# Patient Record
Sex: Male | Born: 1955 | Race: Black or African American | Hispanic: No | Marital: Single | Smoking: Current every day smoker
Health system: Southern US, Community
[De-identification: ages and names within clinical notes are randomized; demographics above are authoritative.]

## PROBLEM LIST (undated history)

## (undated) ENCOUNTER — Emergency Department (HOSPITAL_COMMUNITY): Payer: Medicare Other | Source: Home / Self Care

## (undated) DIAGNOSIS — R768 Other specified abnormal immunological findings in serum: Secondary | ICD-10-CM

## (undated) DIAGNOSIS — J45909 Unspecified asthma, uncomplicated: Secondary | ICD-10-CM

## (undated) DIAGNOSIS — Z9981 Dependence on supplemental oxygen: Secondary | ICD-10-CM

## (undated) DIAGNOSIS — I10 Essential (primary) hypertension: Secondary | ICD-10-CM

## (undated) DIAGNOSIS — J449 Chronic obstructive pulmonary disease, unspecified: Secondary | ICD-10-CM

## (undated) DIAGNOSIS — E785 Hyperlipidemia, unspecified: Secondary | ICD-10-CM

## (undated) DIAGNOSIS — R7689 Other specified abnormal immunological findings in serum: Secondary | ICD-10-CM

## (undated) HISTORY — DX: Essential (primary) hypertension: I10

## (undated) HISTORY — DX: Other specified abnormal immunological findings in serum: R76.89

## (undated) HISTORY — DX: Dependence on supplemental oxygen: Z99.81

## (undated) HISTORY — PX: INCISE AND DRAIN ABCESS: PRO64

## (undated) HISTORY — DX: Other specified abnormal immunological findings in serum: R76.8

---

## 1998-11-17 ENCOUNTER — Encounter: Payer: Self-pay | Admitting: *Deleted

## 1998-11-17 ENCOUNTER — Emergency Department (HOSPITAL_COMMUNITY): Admission: EM | Admit: 1998-11-17 | Discharge: 1998-11-17 | Payer: Self-pay | Admitting: Emergency Medicine

## 2001-07-10 ENCOUNTER — Emergency Department (HOSPITAL_COMMUNITY): Admission: EM | Admit: 2001-07-10 | Discharge: 2001-07-10 | Payer: Self-pay | Admitting: Podiatry

## 2001-07-10 ENCOUNTER — Encounter: Payer: Self-pay | Admitting: *Deleted

## 2002-06-17 ENCOUNTER — Emergency Department (HOSPITAL_COMMUNITY): Admission: EM | Admit: 2002-06-17 | Discharge: 2002-06-17 | Payer: Self-pay

## 2002-06-17 ENCOUNTER — Encounter: Payer: Self-pay | Admitting: Emergency Medicine

## 2015-07-17 ENCOUNTER — Emergency Department (HOSPITAL_COMMUNITY)
Admission: EM | Admit: 2015-07-17 | Discharge: 2015-07-17 | Disposition: A | Discharge status: Home / Self Care | Payer: Commercial Managed Care - HMO | Attending: Emergency Medicine | Admitting: Emergency Medicine

## 2015-07-17 ENCOUNTER — Emergency Department (HOSPITAL_COMMUNITY): Payer: Commercial Managed Care - HMO

## 2015-07-17 ENCOUNTER — Encounter (HOSPITAL_COMMUNITY): Payer: Self-pay | Admitting: Emergency Medicine

## 2015-07-17 DIAGNOSIS — J45909 Unspecified asthma, uncomplicated: Secondary | ICD-10-CM | POA: Diagnosis not present

## 2015-07-17 DIAGNOSIS — F1010 Alcohol abuse, uncomplicated: Secondary | ICD-10-CM | POA: Insufficient documentation

## 2015-07-17 DIAGNOSIS — S299XXA Unspecified injury of thorax, initial encounter: Secondary | ICD-10-CM | POA: Diagnosis not present

## 2015-07-17 DIAGNOSIS — S20311A Abrasion of right front wall of thorax, initial encounter: Secondary | ICD-10-CM | POA: Diagnosis not present

## 2015-07-17 DIAGNOSIS — Y9389 Activity, other specified: Secondary | ICD-10-CM | POA: Diagnosis not present

## 2015-07-17 DIAGNOSIS — Z23 Encounter for immunization: Secondary | ICD-10-CM | POA: Insufficient documentation

## 2015-07-17 DIAGNOSIS — F17210 Nicotine dependence, cigarettes, uncomplicated: Secondary | ICD-10-CM | POA: Diagnosis not present

## 2015-07-17 DIAGNOSIS — W1839XA Other fall on same level, initial encounter: Secondary | ICD-10-CM | POA: Insufficient documentation

## 2015-07-17 DIAGNOSIS — Y998 Other external cause status: Secondary | ICD-10-CM | POA: Diagnosis not present

## 2015-07-17 DIAGNOSIS — S80212A Abrasion, left knee, initial encounter: Secondary | ICD-10-CM | POA: Insufficient documentation

## 2015-07-17 DIAGNOSIS — Y9289 Other specified places as the place of occurrence of the external cause: Secondary | ICD-10-CM | POA: Diagnosis not present

## 2015-07-17 DIAGNOSIS — W19XXXA Unspecified fall, initial encounter: Secondary | ICD-10-CM

## 2015-07-17 DIAGNOSIS — S20211A Contusion of right front wall of thorax, initial encounter: Secondary | ICD-10-CM | POA: Diagnosis not present

## 2015-07-17 DIAGNOSIS — R0781 Pleurodynia: Secondary | ICD-10-CM | POA: Diagnosis not present

## 2015-07-17 DIAGNOSIS — S0081XA Abrasion of other part of head, initial encounter: Secondary | ICD-10-CM | POA: Diagnosis not present

## 2015-07-17 HISTORY — DX: Unspecified asthma, uncomplicated: J45.909

## 2015-07-17 MED ORDER — HYDROCODONE-ACETAMINOPHEN 5-325 MG PO TABS
1.0000 | ORAL_TABLET | Freq: Once | ORAL | Status: AC
Start: 2015-07-17 — End: 2015-07-17
  Administered 2015-07-17: 1 via ORAL
  Filled 2015-07-17: qty 1

## 2015-07-17 MED ORDER — NAPROXEN 375 MG PO TABS: 375.0000 mg | ORAL_TABLET | Freq: Two times a day (BID) | ORAL | Status: DC

## 2015-07-17 MED ORDER — HYDROCODONE-ACETAMINOPHEN 5-325 MG PO TABS: 1.0000 | ORAL_TABLET | ORAL | Status: DC | PRN

## 2015-07-17 MED ORDER — TETANUS-DIPHTH-ACELL PERTUSSIS 5-2.5-18.5 LF-MCG/0.5 IM SUSP
0.5000 mL | Freq: Once | INTRAMUSCULAR | Status: AC
  Administered 2015-07-17: 0.5 mL via INTRAMUSCULAR
  Filled 2015-07-17: qty 0.5

## 2015-07-17 NOTE — ED Notes (Addendum)
Fell last night about 9 pm after drinking a lot hurt rt chest, hurts to take deep breath and move,  And bumped his head,  No loc he states, cannot remember  If he fell down steps or not ,  , hurt his left knee also

## 2015-07-17 NOTE — Discharge Instructions (Signed)
Be sure to use the incentive spirometer every hour as a breathing exercise to help prevent pneumonia. Do not drink alcohol while taking the hydrocodone as this can cause excessive sleepiness, dizziness or other bad reactions. Do not drive or operate heavy machinery with this medicine. If your pain worsens or is not controlled or you develop cough or shortness of breath then return to the ER.

## 2015-07-17 NOTE — ED Provider Notes (Signed)
CSN: [id number]     Arrival date & time 07/17/15  0919 History   First MD Initiated Contact with Patient 07/17/15 1002     Chief Complaint  Patient presents with  . Fall     (Consider location/radiation/quality/duration/timing/severity/associated sxs/prior Treatment) HPI  60 year old male presents after a fall last night causing right-sided chest pain. Patient doesn't remember exactly what happened but knows that he was drinking "a lot" and that he fell last night. Does not specifically remember exactly what happened but thinks he may have hit a curb on his chest. He noticed a small abrasion to his right for head and has a small abrasion to his left knee. He denies any knee pain. No headache, nausea, vomiting, or blurry vision. No dizziness. The only pain he has at this time as his right chest, which hurts with inspiration and cough. Has had a cough for several weeks after having the "flu". Denies shortness of breath.  Past Medical History  Diagnosis Date  . Asthma    History reviewed. No pertinent past surgical history. No family history on file. Social History  Substance Use Topics  . Smoking status: Current Every Day Smoker    Types: Cigarettes  . Smokeless tobacco: None  . Alcohol Use: Yes    Review of Systems  Respiratory: Positive for cough. Negative for shortness of breath.   Cardiovascular: Positive for chest pain.  Gastrointestinal: Negative for nausea, vomiting and abdominal pain.  Musculoskeletal: Negative for arthralgias.  Neurological: Negative for dizziness, weakness, numbness and headaches.      Allergies  Review of patient's allergies indicates no known allergies.  Home Medications   Prior to Admission medications   Not on File   BP 154/98 mmHg  Pulse 77  Temp(Src) 98.9 F (37.2 C) (Oral)  Resp 18  SpO2 100% Physical Exam  Constitutional: He is oriented to person, place, and time. He appears well-developed and well-nourished.  HENT:  Head:  Normocephalic. Head is with abrasion.    Right Ear: External ear normal.  Left Ear: External ear normal.  Nose: Nose normal.  Eyes: EOM are normal. Pupils are equal, round, and reactive to light. Right eye exhibits no discharge. Left eye exhibits no discharge.  Neck: Neck supple.  Cardiovascular: Normal rate, regular rhythm, normal heart sounds and intact distal pulses.   Pulmonary/Chest: Effort normal and breath sounds normal. He exhibits tenderness. He exhibits no deformity.    Abdominal: Soft. There is no tenderness.  Musculoskeletal: He exhibits no edema.       Left knee: He exhibits normal range of motion, no swelling and no deformity. No tenderness found.       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  Alert and oriented x 3. CN 2-12 grossly intact. 5/5 strength in all 4 extremities. Grossly normal sensation  Skin: Skin is warm and dry.  Nursing note and vitals reviewed.   ED Course  Procedures (including critical care time) Labs Review Labs Reviewed - No data to display  Imaging Review Dg Ribs Unilateral W/chest Right  07/17/2015  CLINICAL DATA:  Anterior lower right rib pain after the patient fell this morning. EXAM: RIGHT RIBS AND CHEST - 3+ VIEW COMPARISON:  None. FINDINGS: No visible rib fracture. Benign bone island in the anterior aspect of the right eighth rib. No pneumothorax. Heart size and pulmonary vascularity are normal. Minimal right pleural effusion. Bold left posterior lateral fifth rib fracture.  IMPRESSION: No visible acute right rib fracture. Probable tiny small right pleural effusion. Electronically Signed   By: Lorriane Shire M.D.   On: 07/17/2015 10:50   I have personally reviewed and evaluated these images and lab results as part of my medical decision-making.   EKG Interpretation None      MDM   Final diagnoses:  Fall, initial encounter  Rib contusion, right, initial encounter      No obvious rib fracture on x-ray imaging. Given he is point tender I have low suspicion for other chest etiology for his pain. Pain is improved with a hydrocodone. At this point I think he is having a chest contusion. He does have a very small abrasion to his right scalp but no headache, trouble walking, or neuro findings. No risk factors based on [place] Head CT rule. Do not think head CT is indicated. Will given incentive spirometer, NSAIDs, and hydrocodone for severe pain. Counseled on importance of NSAIDs from her. Counseled not to drink alcohol and take hydrocodone at same time. Discussed return precautions.    Sherwood Gambler, MD 07/17/15 1139

## 2016-06-02 ENCOUNTER — Ambulatory Visit: Payer: Commercial Managed Care - HMO | Admitting: Family Medicine

## 2016-06-10 ENCOUNTER — Ambulatory Visit: Payer: Medicare HMO | Attending: Family Medicine | Admitting: Family Medicine

## 2016-06-10 VITALS — BP 157/91 | HR 67 | Temp 97.7°F | Resp 18 | Ht 69.0 in | Wt 130.2 lb

## 2016-06-10 DIAGNOSIS — Z0000 Encounter for general adult medical examination without abnormal findings: Secondary | ICD-10-CM | POA: Insufficient documentation

## 2016-06-10 DIAGNOSIS — R05 Cough: Secondary | ICD-10-CM | POA: Diagnosis not present

## 2016-06-10 DIAGNOSIS — F17200 Nicotine dependence, unspecified, uncomplicated: Secondary | ICD-10-CM | POA: Diagnosis not present

## 2016-06-10 DIAGNOSIS — F17210 Nicotine dependence, cigarettes, uncomplicated: Secondary | ICD-10-CM | POA: Insufficient documentation

## 2016-06-10 DIAGNOSIS — I10 Essential (primary) hypertension: Secondary | ICD-10-CM | POA: Diagnosis not present

## 2016-06-10 DIAGNOSIS — J45909 Unspecified asthma, uncomplicated: Secondary | ICD-10-CM | POA: Insufficient documentation

## 2016-06-10 DIAGNOSIS — R053 Chronic cough: Secondary | ICD-10-CM

## 2016-06-10 DIAGNOSIS — Z79899 Other long term (current) drug therapy: Secondary | ICD-10-CM | POA: Insufficient documentation

## 2016-06-10 DIAGNOSIS — Z7689 Persons encountering health services in other specified circumstances: Secondary | ICD-10-CM | POA: Insufficient documentation

## 2016-06-10 MED ORDER — AMLODIPINE BESYLATE 5 MG PO TABS: 5.0000 mg | ORAL_TABLET | Freq: Every day | ORAL | 3 refills | Status: DC

## 2016-06-10 MED ORDER — GUAIFENESIN ER 600 MG PO TB12: 600.0000 mg | ORAL_TABLET | Freq: Two times a day (BID) | ORAL | 0 refills | Status: DC

## 2016-06-10 MED ORDER — MONTELUKAST SODIUM 10 MG PO TABS: 10.0000 mg | ORAL_TABLET | Freq: Every day | ORAL | 6 refills | Status: DC

## 2016-06-10 MED ORDER — ALBUTEROL SULFATE HFA 108 (90 BASE) MCG/ACT IN AERS: 2.0000 | INHALATION_SPRAY | Freq: Four times a day (QID) | RESPIRATORY_TRACT | 2 refills | Status: DC | PRN

## 2016-06-10 MED ORDER — NICOTINE 21 MG/24HR TD PT24: 21.0000 mg | MEDICATED_PATCH | Freq: Every day | TRANSDERMAL | 1 refills | Status: DC

## 2016-06-10 MED ORDER — LOSARTAN POTASSIUM 25 MG PO TABS: 25.0000 mg | ORAL_TABLET | Freq: Every day | ORAL | 2 refills | Status: DC

## 2016-06-10 MED FILL — LOSARTAN POTASSIUM 25 MG TA: 25 | 30 days supply | Qty: 30 | Fill #0

## 2016-06-10 MED FILL — MONTELUKAST SOD 10 MG TAB: 10 | 30 days supply | Qty: 30 | Fill #0

## 2016-06-10 MED FILL — ?AMLODIPINE BESYLATE 5 MG T: 5 | 30 days supply | Qty: 30 | Fill #0

## 2016-06-10 MED FILL — !VENTOLIN HFA INHALER: 108 (90 BAS | 25 days supply | Qty: 18 | Fill #0

## 2016-06-10 NOTE — Progress Notes (Signed)
Patient is here for physical  Patient is not on any current medication  Patient stated that he fell couple months ago and now he has pain on his right and left side of back   Patient declined the flu shot today  Patient has eaten today

## 2016-06-10 NOTE — Patient Instructions (Addendum)
Schedule appointment for lab work. Apply for orange card to complete referral process.    Hypertension Hypertension, commonly called high blood pressure, is when the force of blood pumping through the arteries is too strong. The arteries are the blood vessels that carry blood from the heart throughout the body. Hypertension forces the heart to work harder to pump blood and may cause arteries to become narrow or stiff. Having untreated or uncontrolled hypertension can cause heart attacks, strokes, kidney disease, and other problems. A blood pressure reading consists of a higher number over a lower number. Ideally, your blood pressure should be below 120/80. The first ("top") number is called the systolic pressure. It is a measure of the pressure in your arteries as your heart beats. The second ("bottom") number is called the diastolic pressure. It is a measure of the pressure in your arteries as the heart relaxes. What are the causes? The cause of this condition is not known. What increases the risk? Some risk factors for high blood pressure are under your control. Others are not. Factors you can change   Smoking.  Having type 2 diabetes mellitus, high cholesterol, or both.  Not getting enough exercise or physical activity.  Being overweight.  Having too much fat, sugar, calories, or salt (sodium) in your diet.  Drinking too much alcohol. Factors that are difficult or impossible to change   Having chronic kidney disease.  Having a family history of high blood pressure.  Age. Risk increases with age.  Race. You may be at higher risk if you are African-American.  Gender. Men are at higher risk than women before age 30. After age 76, women are at higher risk than men.  Having obstructive sleep apnea.  Stress. What are the signs or symptoms? Extremely high blood pressure (hypertensive crisis) may cause:  Headache.  Anxiety.  Shortness of breath.  Nosebleed.  Nausea and  vomiting.  Severe chest pain.  Jerky movements you cannot control (seizures). How is this diagnosed? This condition is diagnosed by measuring your blood pressure while you are seated, with your arm resting on a surface. The cuff of the blood pressure monitor will be placed directly against the skin of your upper arm at the level of your heart. It should be measured at least twice using the same arm. Certain conditions can cause a difference in blood pressure between your right and left arms. Certain factors can cause blood pressure readings to be lower or higher than normal (elevated) for a short period of time:  When your blood pressure is higher when you are in a health care provider's office than when you are at home, this is called white coat hypertension. Most people with this condition do not need medicines.  When your blood pressure is higher at home than when you are in a health care provider's office, this is called masked hypertension. Most people with this condition may need medicines to control blood pressure. If you have a high blood pressure reading during one visit or you have normal blood pressure with other risk factors:  You may be asked to return on a different day to have your blood pressure checked again.  You may be asked to monitor your blood pressure at home for 1 week or longer. If you are diagnosed with hypertension, you may have other blood or imaging tests to help your health care provider understand your overall risk for other conditions. How is this treated? This condition is treated by making healthy  lifestyle changes, such as eating healthy foods, exercising more, and reducing your alcohol intake. Your health care provider may prescribe medicine if lifestyle changes are not enough to get your blood pressure under control, and if:  Your systolic blood pressure is above 130.  Your diastolic blood pressure is above 80. Your personal target blood pressure may vary  depending on your medical conditions, your age, and other factors. Follow these instructions at home: Eating and drinking   Eat a diet that is high in fiber and potassium, and low in sodium, added sugar, and fat. An example eating plan is called the DASH (Dietary Approaches to Stop Hypertension) diet. To eat this way:  Eat plenty of fresh fruits and vegetables. Try to fill half of your plate at each meal with fruits and vegetables.  Eat whole grains, such as whole wheat pasta, brown rice, or whole grain bread. Fill about one quarter of your plate with whole grains.  Eat or drink low-fat dairy products, such as skim milk or low-fat yogurt.  Avoid fatty cuts of meat, processed or cured meats, and poultry with skin. Fill about one quarter of your plate with lean proteins, such as fish, chicken without skin, beans, eggs, and tofu.  Avoid premade and processed foods. These tend to be higher in sodium, added sugar, and fat.  Reduce your daily sodium intake. Most people with hypertension should eat less than 1,500 mg of sodium a day.  Limit alcohol intake to no more than 1 drink a day for nonpregnant women and 2 drinks a day for men. One drink equals 12 oz of beer, 5 oz of wine, or 1 oz of hard liquor. Lifestyle   Work with your health care provider to maintain a healthy body weight or to lose weight. Ask what an ideal weight is for you.  Get at least 30 minutes of exercise that causes your heart to beat faster (aerobic exercise) most days of the week. Activities may include walking, swimming, or biking.  Include exercise to strengthen your muscles (resistance exercise), such as pilates or lifting weights, as part of your weekly exercise routine. Try to do these types of exercises for 30 minutes at least 3 days a week.  Do not use any products that contain nicotine or tobacco, such as cigarettes and e-cigarettes. If you need help quitting, ask your health care provider.  Monitor your blood  pressure at home as told by your health care provider.  Keep all follow-up visits as told by your health care provider. This is important. Medicines   Take over-the-counter and prescription medicines only as told by your health care provider. Follow directions carefully. Blood pressure medicines must be taken as prescribed.  Do not skip doses of blood pressure medicine. Doing this puts you at risk for problems and can make the medicine less effective.  Ask your health care provider about side effects or reactions to medicines that you should watch for. Contact a health care provider if:  You think you are having a reaction to a medicine you are taking.  You have headaches that keep coming back (recurring).  You feel dizzy.  You have swelling in your ankles.  You have trouble with your vision. Get help right away if:  You develop a severe headache or confusion.  You have unusual weakness or numbness.  You feel faint.  You have severe pain in your chest or abdomen.  You vomit repeatedly.  You have trouble breathing. Summary  Hypertension is  when the force of blood pumping through your arteries is too strong. If this condition is not controlled, it may put you at risk for serious complications.  Your personal target blood pressure may vary depending on your medical conditions, your age, and other factors. For most people, a normal blood pressure is less than 120/80.  Hypertension is treated with lifestyle changes, medicines, or a combination of both. Lifestyle changes include weight loss, eating a healthy, low-sodium diet, exercising more, and limiting alcohol. This information is not intended to replace advice given to you by your health care provider. Make sure you discuss any questions you have with your health care provider. Document Released: 03/07/2005 Document Revised: 02/03/2016 Document Reviewed: 02/03/2016 Elsevier Interactive Patient Education  2017 [employer].  Asthma, Adult Asthma is a condition of the lungs in which the airways tighten and narrow. Asthma can make it hard to breathe. Asthma cannot be cured, but medicine and lifestyle changes can help control it. Asthma may be started (triggered) by:  Animal skin flakes (dander).  Dust.  Cockroaches.  Pollen.  Mold.  Smoke.  Cleaning products.  Hair sprays or aerosol sprays.  Paint fumes or strong smells.  Cold air, weather changes, and winds.  Crying or laughing hard.  Stress.  Certain medicines or drugs.  Foods, such as dried fruit, potato chips, and sparkling grape juice.  Infections or conditions (colds, flu).  Exercise.  Certain medical conditions or diseases.  Exercise or tiring activities. Follow these instructions at home:  Take medicine as told by your doctor.  Use a peak flow meter as told by your doctor. A peak flow meter is a tool that measures how well the lungs are working.  Record and keep track of the peak flow meter's readings.  Understand and use the asthma action plan. An asthma action plan is a written plan for taking care of your asthma and treating your attacks.  To help prevent asthma attacks:  Do not smoke. Stay away from secondhand smoke.  Change your heating and air conditioning filter often.  Limit your use of fireplaces and wood stoves.  Get rid of pests (such as roaches and mice) and their droppings.  Throw away plants if you see mold on them.  Clean your floors. Dust regularly. Use cleaning products that do not smell.  Have someone vacuum when you are not home. Use a vacuum cleaner with a HEPA filter if possible.  Replace carpet with wood, tile, or vinyl flooring. Carpet can trap animal skin flakes and dust.  Use allergy-proof pillows, mattress covers, and box spring covers.  Wash bed sheets and blankets every week in hot water and dry them in a dryer.  Use blankets that are made of polyester or cotton.  Clean  bathrooms and kitchens with bleach. If possible, have someone repaint the walls in these rooms with mold-resistant paint. Keep out of the rooms that are being cleaned and painted.  Wash hands often. Contact a doctor if:  You have make a whistling sound when breaking (wheeze), have shortness of breath, or have a cough even if taking medicine to prevent attacks.  The colored mucus you cough up (sputum) is thicker than usual.  The colored mucus you cough up changes from clear or white to yellow, green, gray, or bloody.  You have problems from the medicine you are taking such as:  A rash.  Itching.  Swelling.  Trouble breathing.  You need reliever medicines more than 2-3 times a week.  Your peak flow measurement is still at 50-79% of your personal best after following the action plan for 1 hour.  You have a fever. Get help right away if:  You seem to be worse and are not responding to medicine during an asthma attack.  You are short of breath even at rest.  You get short of breath when doing very little activity.  You have trouble eating, drinking, or talking.  You have chest pain.  You have a fast heartbeat.  Your lips or fingernails start to turn blue.  You are light-headed, dizzy, or faint.  Your peak flow is less than 50% of your personal best. This information is not intended to replace advice given to you by your health care provider. Make sure you discuss any questions you have with your health care provider. Document Released: 08/24/2007 Document Revised: 08/13/2015 Document Reviewed: 10/04/2012 Elsevier Interactive Patient Education  2017 [employer].

## 2016-06-10 NOTE — Progress Notes (Signed)
Subjective:  Patient ID: Jason Wong, male    DOB: 1956-01-18  Age: 61 y.o. MRN: [id number]  CC: Establish Care   HPI Jason Wong presents for   Annual physical exam: Difficulty sleeping for 1 year. 3 hours of sleep per night on average. Taking nothing for symptoms. Denies any SI/HI.  History of asthma since childhood. Reports not having a inhaler. Current everyday cigarette smoker since age 8. He currently smokes 3 packs per day. He reports congested cough for 2 months. Drinks 3 beers daily. Declines resources for substance dependence at this time. He reports FH of HTN and cancer. HTN : Mother and Sister. Cancer: Sister-Breast and Brother colon.  Outpatient Medications Prior to Visit  Medication Sig Dispense Refill  . HYDROcodone-acetaminophen (NORCO) 5-325 MG tablet Take 1 tablet by mouth every 4 (four) hours as needed for severe pain. (Patient not taking: Reported on 06/10/2016) 15 tablet 0  . naproxen (NAPROSYN) 375 MG tablet Take 1 tablet (375 mg total) by mouth 2 (two) times daily with a meal. (Patient not taking: Reported on 06/10/2016) 14 tablet 0  . vitamin E 1000 UNIT capsule Take 1,000 Units by mouth daily.     No facility-administered medications prior to visit.     ROS Review of Systems  Constitutional: Negative.   HENT: Positive for rhinorrhea.   Respiratory: Positive for cough.   Cardiovascular: Negative.   Gastrointestinal: Negative.   Endocrine: Negative.   Genitourinary: Negative.   Neurological: Negative.   Psychiatric/Behavioral: Positive for sleep disturbance. Negative for suicidal ideas.    Objective:  BP (!) 157/91 (BP Location: Left Arm, Patient Position: Sitting, Cuff Size: Normal)   Pulse 67   Temp 97.7 F (36.5 C) (Oral)   Resp 18   Ht 5\' 9"  (1.753 m)   Wt 130 lb 3.2 oz (59.1 kg)   SpO2 98%   BMI 19.23 kg/m   BP/Weight 06/10/2016 9/76/7341  Systolic BP 937 902  Diastolic BP 91 98  Wt. (Lbs) 130.2 -  BMI 19.23 -    Physical Exam    Constitutional: He is oriented to person, place, and time. He appears well-developed and well-nourished.  HENT:  Head: Normocephalic and atraumatic.  Right Ear: External ear normal.  Left Ear: External ear normal.  Nose: Nose normal.  Mouth/Throat: Oropharynx is clear and moist.  Eyes: Conjunctivae and EOM are normal. Pupils are equal, round, and reactive to light.  Neck: Normal range of motion. Neck supple. No JVD present.  Cardiovascular: Normal rate, regular rhythm, normal heart sounds and intact distal pulses.   Pulmonary/Chest: Effort normal and breath sounds normal.  Abdominal: Soft. Bowel sounds are normal.  Musculoskeletal: Normal range of motion.  Missing digits of left hand. History of lawnmower accident at 61 y.o.  Neurological: He is alert and oriented to person, place, and time. He has normal reflexes.  Skin: Skin is warm and dry.  Psychiatric: He has a normal mood and affect. His behavior is normal. Judgment and thought content normal.  Nursing note and vitals reviewed.  Assessment & Plan:   Problem List Items Addressed This Visit      Cardiovascular and Mediastinum   Essential hypertension   -Schedule BP recheck in 2 weeks with nurse.   -If BP is greater than 90/60 (MAP 65 or greater) but not less than 130/80 may increase dose of    amlodipine to 10 mg QD and recheck in another 2 weeks.    Relevant Medications   losartan (COZAAR) 25  MG tablet   amLODipine (NORVASC) 5 MG tablet     Respiratory   Asthma   Relevant Medications   montelukast (SINGULAIR) 10 MG tablet   albuterol (PROVENTIL HFA;VENTOLIN HFA) 108 (90 Base) MCG/ACT inhaler     Other   Current every day smoker   Relevant Medications   nicotine (NICODERM CQ - DOSED IN MG/24 HOURS) 21 mg/24hr patch    Other Visit Diagnoses    Annual physical exam    -  Primary   -Labs placed for future order due to lab being closed early today.    Relevant Orders   Ambulatory referral to Ophthalmology   Ambulatory  referral to Dentistry   Comprehensive metabolic panel   Ambulatory referral to Gastroenterology   Chronic cough       Relevant Medications   guaiFENesin (MUCINEX) 600 MG 12 hr tablet   Other Relevant Orders   DG Chest 2 View      Meds ordered this encounter  Medications  . losartan (COZAAR) 25 MG tablet    Sig: Take 1 tablet (25 mg total) by mouth daily.    Dispense:  30 tablet    Refill:  2    Order Specific Question:   Supervising Provider    Answer:   Tresa Garter [id number]  . amLODipine (NORVASC) 5 MG tablet    Sig: Take 1 tablet (5 mg total) by mouth daily.    Dispense:  90 tablet    Refill:  3    Order Specific Question:   Supervising Provider    Answer:   Tresa Garter [id number]  . montelukast (SINGULAIR) 10 MG tablet    Sig: Take 1 tablet (10 mg total) by mouth at bedtime.    Dispense:  30 tablet    Refill:  6    Order Specific Question:   Supervising Provider    Answer:   Tresa Garter [id number]  . albuterol (PROVENTIL HFA;VENTOLIN HFA) 108 (90 Base) MCG/ACT inhaler    Sig: Inhale 2 puffs into the lungs every 6 (six) hours as needed for wheezing or shortness of breath.    Dispense:  1 Inhaler    Refill:  2    Order Specific Question:   Supervising Provider    Answer:   Tresa Garter [id number]  . nicotine (NICODERM CQ - DOSED IN MG/24 HOURS) 21 mg/24hr patch    Sig: Place 1 patch (21 mg total) onto the skin daily.    Dispense:  28 patch    Refill:  1    Order Specific Question:   Supervising Provider    Answer:   Tresa Garter [id number]  . guaiFENesin (MUCINEX) 600 MG 12 hr tablet    Sig: Take 1 tablet (600 mg total) by mouth 2 (two) times daily.    Dispense:  14 tablet    Refill:  0    Order Specific Question:   Supervising Provider    Answer:   Tresa Garter [id number]    Follow-up: Return in about 2 weeks (around 06/24/2016) for  BP check with RN & 3 months with PCP.  Alfonse Spruce FNP

## 2016-06-13 ENCOUNTER — Ambulatory Visit: Payer: Medicare HMO | Attending: Family Medicine

## 2016-06-13 ENCOUNTER — Other Ambulatory Visit: Payer: Self-pay | Admitting: Family Medicine

## 2016-06-13 DIAGNOSIS — I10 Essential (primary) hypertension: Secondary | ICD-10-CM

## 2016-06-13 DIAGNOSIS — E559 Vitamin D deficiency, unspecified: Secondary | ICD-10-CM | POA: Diagnosis not present

## 2016-06-13 DIAGNOSIS — R05 Cough: Secondary | ICD-10-CM

## 2016-06-13 DIAGNOSIS — Z0000 Encounter for general adult medical examination without abnormal findings: Secondary | ICD-10-CM | POA: Diagnosis not present

## 2016-06-13 DIAGNOSIS — R053 Chronic cough: Secondary | ICD-10-CM

## 2016-06-13 DIAGNOSIS — Z131 Encounter for screening for diabetes mellitus: Secondary | ICD-10-CM | POA: Diagnosis not present

## 2016-06-13 NOTE — Progress Notes (Signed)
Patient here for lab visit  

## 2016-06-14 ENCOUNTER — Encounter: Payer: Self-pay | Admitting: Family Medicine

## 2016-06-14 LAB — CMP14+EGFR
ALT: 167 [IU]/L — ABNORMAL HIGH (ref 0–44)
AST: 158 [IU]/L — ABNORMAL HIGH (ref 0–40)
Albumin/Globulin Ratio: 1.3 (ref 1.2–2.2)
Albumin: 4.8 g/dL (ref 3.6–4.8)
Alkaline Phosphatase: 120 [IU]/L — ABNORMAL HIGH (ref 39–117)
BUN/Creatinine Ratio: 9 — ABNORMAL LOW (ref 10–24)
BUN: 8 mg/dL (ref 8–27)
Bilirubin Total: 0.6 mg/dL (ref 0.0–1.2)
CO2: 28 mmol/L (ref 18–29)
Calcium: 9.9 mg/dL (ref 8.6–10.2)
Chloride: 99 mmol/L (ref 96–106)
Creatinine, Ser: 0.94 mg/dL (ref 0.76–1.27)
GFR calc Af Amer: 101 mL/min/{1.73_m2} (ref >59)
GFR calc non Af Amer: 87 mL/min/{1.73_m2} (ref >59)
Globulin, Total: 3.8 g/dL (ref 1.5–4.5)
Glucose: 96 mg/dL (ref 65–99)
Potassium: 4.3 mmol/L (ref 3.5–5.2)
Sodium: 144 mmol/L (ref 134–144)
Total Protein: 8.6 g/dL — ABNORMAL HIGH (ref 6.0–8.5)

## 2016-06-14 LAB — CBC WITH DIFFERENTIAL/PLATELET
Basophils Absolute: 0 10*3/uL (ref 0.0–0.2)
Basos: 1 %
EOS (ABSOLUTE): 0.1 10*3/uL (ref 0.0–0.4)
Eos: 1 %
Hematocrit: 47.2 % (ref 37.5–51.0)
Hemoglobin: 16.6 g/dL (ref 13.0–17.7)
Immature Grans (Abs): 0 10*3/uL (ref 0.0–0.1)
Immature Granulocytes: 0 %
Lymphocytes Absolute: 2.5 10*3/uL (ref 0.7–3.1)
Lymphs: 46 %
MCH: 33.3 pg — ABNORMAL HIGH (ref 26.6–33.0)
MCHC: 35.2 g/dL (ref 31.5–35.7)
MCV: 95 fL (ref 79–97)
Monocytes Absolute: 0.4 10*3/uL (ref 0.1–0.9)
Monocytes: 7 %
Neutrophils Absolute: 2.4 10*3/uL (ref 1.4–7.0)
Neutrophils: 45 %
Platelets: 242 10*3/uL (ref 150–379)
RBC: 4.99 x10E6/uL (ref 4.14–5.80)
RDW: 14.7 % (ref 12.3–15.4)
WBC: 5.3 10*3/uL (ref 3.4–10.8)

## 2016-06-14 LAB — LIPID PANEL
Chol/HDL Ratio: 3 {ratio} (ref 0.0–5.0)
Cholesterol, Total: 166 mg/dL (ref 100–199)
HDL: 55 mg/dL (ref >39)
LDL Calculated: 96 mg/dL (ref 0–99)
Triglycerides: 76 mg/dL (ref 0–149)
VLDL Cholesterol Cal: 15 mg/dL (ref 5–40)

## 2016-06-14 LAB — HEMOGLOBIN A1C
Est. average glucose Bld gHb Est-mCnc: 117 mg/dL
Hgb A1c MFr Bld: 5.7 % — ABNORMAL HIGH (ref 4.8–5.6)

## 2016-06-14 LAB — PSA: Prostate Specific Ag, Serum: 0.2 ng/mL (ref 0.0–4.0)

## 2016-06-14 LAB — MICROALBUMIN / CREATININE URINE RATIO
Creatinine, Urine: 63.7 mg/dL
Microalb/Creat Ratio: 186.2 mg/g{creat} — ABNORMAL HIGH (ref 0.0–30.0)
Microalbumin, Urine: 118.6 ug/mL

## 2016-06-14 LAB — VITAMIN D 25 HYDROXY (VIT D DEFICIENCY, FRACTURES): Vit D, 25-Hydroxy: 13.7 ng/mL — ABNORMAL LOW (ref 30.0–100.0)

## 2016-06-14 LAB — TSH: TSH: 1.11 u[IU]/mL (ref 0.450–4.500)

## 2016-06-20 ENCOUNTER — Other Ambulatory Visit: Payer: Self-pay | Admitting: Family Medicine

## 2016-06-20 DIAGNOSIS — R7989 Other specified abnormal findings of blood chemistry: Secondary | ICD-10-CM

## 2016-06-20 DIAGNOSIS — R7303 Prediabetes: Secondary | ICD-10-CM

## 2016-06-20 DIAGNOSIS — E559 Vitamin D deficiency, unspecified: Secondary | ICD-10-CM

## 2016-06-20 DIAGNOSIS — R809 Proteinuria, unspecified: Secondary | ICD-10-CM

## 2016-06-20 DIAGNOSIS — R945 Abnormal results of liver function studies: Secondary | ICD-10-CM

## 2016-06-20 MED ORDER — VITAMIN D (ERGOCALCIFEROL) 1.25 MG (50000 UNIT) PO CAPS: 50000.0000 [IU] | ORAL_CAPSULE | ORAL | 0 refills | Status: AC

## 2016-06-21 ENCOUNTER — Telehealth: Payer: Self-pay

## 2016-06-21 NOTE — Telephone Encounter (Signed)
-----   Message from Alfonse Spruce, [place] sent at 06/20/2016  3:13 PM EDT ----- -Liver function test show decreased liver function. Avoid alcohol and tylenol. Begin eating a diet lower in fat. Decrease your intake of fried foods and whole milk. Recommend recheck in 6 months. -HgbA1c which screen for diabetes shows you are pre-diabetic.-Start eating a carbohydrate modified diet. Avoid eating large amounts of starches, white bread, rice, and sugar. Increase activity. Recommend recheck in 3 mths. -Microalbumin/creatinine ratio level was elevated. This tests for protein in your urine that could indicate early signs of kidney damage. Take your hypertension medications as prescribed. Recommend recheck in 3 mths.  -Vitamin D level was low. Vitamin D helps to keep bones strong. You were prescribed ergocalciferol (capsules) to increase your vitamin-d level. Once finished start taking OTC vitamin d supplement with 800 international units (IU) of vitamin-d per day. Recommend recheck in 3 mths. -Cholesterol levels normal. -PSA is normal. PSA is screens for prostate problems -Thyroid function normal -Liver function normal -Kidney function normal

## 2016-06-21 NOTE — Telephone Encounter (Signed)
CMA call to go over lab results   CMA call both numbers on file no answer but left a VM stating the reason of the call & to call us back

## 2016-06-24 ENCOUNTER — Ambulatory Visit: Payer: Medicare HMO | Attending: Family Medicine | Admitting: *Deleted

## 2016-06-24 VITALS — BP 112/64 | HR 77

## 2016-06-24 DIAGNOSIS — I10 Essential (primary) hypertension: Secondary | ICD-10-CM | POA: Insufficient documentation

## 2016-06-24 NOTE — Progress Notes (Signed)
Pt here for f/u BP check after office visit 06/10/16 Pt denies chest pain, SOB, HA, new vison concerns, or generalized swelling.   Has been taking medications as prescribed. Blood pressure taken manually while patient is sitting. BP reading: 110/60 and 112/64. Pt expresses desire to quit smoking and to do better for his health.   No medication adjustments made. Nurse visit will be routed to provider.Carilyn Goodpasture, RN, BSN

## 2016-06-30 ENCOUNTER — Ambulatory Visit: Payer: Medicare HMO | Attending: Family Medicine

## 2016-07-07 ENCOUNTER — Other Ambulatory Visit: Payer: Self-pay | Admitting: *Deleted

## 2016-07-07 DIAGNOSIS — J45909 Unspecified asthma, uncomplicated: Secondary | ICD-10-CM

## 2016-07-07 MED ORDER — ALBUTEROL SULFATE HFA 108 (90 BASE) MCG/ACT IN AERS: 2.0000 | INHALATION_SPRAY | Freq: Four times a day (QID) | RESPIRATORY_TRACT | 3 refills | Status: DC | PRN

## 2016-07-07 NOTE — Telephone Encounter (Signed)
PRINTED FOR PASS PROGRAM 

## 2016-08-30 ENCOUNTER — Other Ambulatory Visit: Payer: Self-pay | Admitting: Family Medicine

## 2016-09-13 ENCOUNTER — Ambulatory Visit: Payer: Self-pay | Admitting: Family Medicine

## 2016-09-13 MED FILL — LOSARTAN POTASSIUM 25 MG TA: 25 | 30 days supply | Qty: 30 | Fill #1

## 2016-09-13 MED FILL — $VENTOLIN HFA 18G INHALER: 108 (90 BAS | 25 days supply | Qty: 18 | Fill #1

## 2016-09-13 MED FILL — AMLODIPINE BESYLATE 5 MG TA: 5 | 30 days supply | Qty: 30 | Fill #1

## 2016-09-13 MED FILL — MONTELUKAST SOD 10 MG TAB: 10 | 30 days supply | Qty: 30 | Fill #1

## 2016-09-13 NOTE — Progress Notes (Deleted)
   Subjective:  Patient ID: Jason Wong, male    DOB: 09/18/1955  Age: 61 y.o. MRN: [id number]  CC: No chief complaint on file.   HPI Jason Wong presents for follow up. PHM of HTN and asthma.   Outpatient Medications Prior to Visit  Medication Sig Dispense Refill  . albuterol (PROVENTIL HFA;VENTOLIN HFA) 108 (90 Base) MCG/ACT inhaler Inhale 2 puffs into the lungs every 6 (six) hours as needed for wheezing or shortness of breath. 45 g 3  . amLODipine (NORVASC) 5 MG tablet Take 1 tablet (5 mg total) by mouth daily. 90 tablet 3  . guaiFENesin (MUCINEX) 600 MG 12 hr tablet Take 1 tablet (600 mg total) by mouth 2 (two) times daily. (Patient not taking: Reported on 06/24/2016) 14 tablet 0  . HYDROcodone-acetaminophen (NORCO) 5-325 MG tablet Take 1 tablet by mouth every 4 (four) hours as needed for severe pain. (Patient not taking: Reported on 06/10/2016) 15 tablet 0  . losartan (COZAAR) 25 MG tablet Take 1 tablet (25 mg total) by mouth daily. 30 tablet 2  . montelukast (SINGULAIR) 10 MG tablet Take 1 tablet (10 mg total) by mouth at bedtime. 30 tablet 6  . naproxen (NAPROSYN) 375 MG tablet Take 1 tablet (375 mg total) by mouth 2 (two) times daily with a meal. (Patient not taking: Reported on 06/10/2016) 14 tablet 0  . nicotine (NICODERM CQ - DOSED IN MG/24 HOURS) 21 mg/24hr patch Place 1 patch (21 mg total) onto the skin daily. 28 patch 1  . vitamin E 1000 UNIT capsule Take 1,000 Units by mouth daily.     No facility-administered medications prior to visit.     ROS Review of Systems      Objective:  There were no vitals taken for this visit.  BP/Weight 06/24/2016 06/10/2016 06/07/2332  Systolic BP 356 861 683  Diastolic BP 64 91 98  Wt. (Lbs) - 130.2 -  BMI - 19.23 -     Physical Exam   Assessment & Plan:   Problem List Items Addressed This Visit    None      No orders of the defined types were placed in this encounter.   Follow-up: No Follow-up on file.   Alfonse Spruce FNP

## 2016-09-19 ENCOUNTER — Ambulatory Visit: Payer: Medicare HMO | Attending: Family Medicine | Admitting: Family Medicine

## 2016-09-19 VITALS — BP 96/61 | HR 69 | Temp 98.0°F | Resp 18 | Ht 68.0 in | Wt 123.4 lb

## 2016-09-19 DIAGNOSIS — Z129 Encounter for screening for malignant neoplasm, site unspecified: Secondary | ICD-10-CM | POA: Insufficient documentation

## 2016-09-19 DIAGNOSIS — Z800 Family history of malignant neoplasm of digestive organs: Secondary | ICD-10-CM | POA: Diagnosis not present

## 2016-09-19 DIAGNOSIS — Z8249 Family history of ischemic heart disease and other diseases of the circulatory system: Secondary | ICD-10-CM | POA: Diagnosis not present

## 2016-09-19 DIAGNOSIS — F5101 Primary insomnia: Secondary | ICD-10-CM | POA: Insufficient documentation

## 2016-09-19 DIAGNOSIS — I10 Essential (primary) hypertension: Secondary | ICD-10-CM | POA: Diagnosis not present

## 2016-09-19 DIAGNOSIS — Z0000 Encounter for general adult medical examination without abnormal findings: Secondary | ICD-10-CM | POA: Diagnosis not present

## 2016-09-19 DIAGNOSIS — R05 Cough: Secondary | ICD-10-CM | POA: Diagnosis not present

## 2016-09-19 DIAGNOSIS — B86 Scabies: Secondary | ICD-10-CM | POA: Diagnosis not present

## 2016-09-19 DIAGNOSIS — Z803 Family history of malignant neoplasm of breast: Secondary | ICD-10-CM | POA: Diagnosis not present

## 2016-09-19 DIAGNOSIS — R053 Chronic cough: Secondary | ICD-10-CM

## 2016-09-19 DIAGNOSIS — F17200 Nicotine dependence, unspecified, uncomplicated: Secondary | ICD-10-CM | POA: Diagnosis not present

## 2016-09-19 MED ORDER — GUAIFENESIN ER 600 MG PO TB12: 600.0000 mg | ORAL_TABLET | Freq: Two times a day (BID) | ORAL | 0 refills | Status: DC

## 2016-09-19 MED ORDER — GUAIFENESIN-DM 100-10 MG/5ML PO SYRP: 5.0000 mL | ORAL_SOLUTION | ORAL | 0 refills | Status: DC | PRN

## 2016-09-19 MED ORDER — TRAZODONE HCL 50 MG PO TABS: 25.0000 mg | ORAL_TABLET | Freq: Every evening | ORAL | 0 refills | Status: DC | PRN

## 2016-09-19 MED ORDER — NICOTINE 21 MG/24HR TD PT24: 21.0000 mg | MEDICATED_PATCH | Freq: Every day | TRANSDERMAL | 1 refills | Status: DC

## 2016-09-19 MED ORDER — PERMETHRIN 5 % EX CREA: TOPICAL_CREAM | CUTANEOUS | 0 refills | Status: DC

## 2016-09-19 NOTE — Progress Notes (Signed)
Patient is here for f/up HTN 

## 2016-09-19 NOTE — Patient Instructions (Signed)
Nicotine skin patches What is this medicine? NICOTINE ([place]) helps people stop smoking. The patches replace the nicotine found in cigarettes and help to decrease withdrawal effects. They are most effective when used in combination with a stop-smoking program. This medicine may be used for other purposes; ask your health care provider or pharmacist if you have questions. COMMON BRAND NAME(S): Habitrol, Nicoderm CQ, Nicotrol What should I tell my health care provider before I take this medicine? They need to know if you have any of these conditions: -diabetes -heart disease, angina, irregular heartbeat or previous heart attack -high blood pressure -lung disease, including asthma -overactive thyroid -pheochromocytoma -seizures or a history of seizures -skin problems, like eczema -stomach problems or ulcers -an unusual or allergic reaction to nicotine, adhesives, other medicines, foods, dyes, or preservatives -pregnant or trying to get pregnant -breast-feeding How should I use this medicine? This medicine is for use on the skin. Follow the directions that come with the patches. Find an area of skin on your upper arm, chest, or back that is clean, dry, greaseless, undamaged and hairless. Wash hands with plain soap and water. Do not use anything that contains aloe, lanolin or glycerin as these may prevent the patch from sticking. Dry thoroughly. Remove the patch from the sealed pouch. Do not try to cut or trim the patch. Using your palm, press the patch firmly in place for 10 seconds to make sure that there is good contact with your skin. After applying the patch, wash your hands. Change the patch every day, keeping to a regular schedule. When you apply a new patch, use a new area of skin. Wait at least 1 week before using the same area again. Talk to your pediatrician regarding the use of this medicine in children. Special care may be needed. Overdosage: If you think you have taken too much  of this medicine contact a poison control center or emergency room at once. NOTE: This medicine is only for you. Do not share this medicine with others. What if I miss a dose? If you forget to replace a patch, use it as soon as you can. Only use one patch at a time and do not leave on the skin for longer than directed. If a patch falls off, you can replace it, but keep to your schedule and remove the patch at the right time. What may interact with this medicine? -medicines for asthma -medicines for blood pressure -medicines for mental depression This list may not describe all possible interactions. Give your health care provider a list of all the medicines, herbs, non-prescription drugs, or dietary supplements you use. Also tell them if you smoke, drink alcohol, or use illegal drugs. Some items may interact with your medicine. What should I watch for while using this medicine? You should begin using the nicotine patch the day you stop smoking. It is okay if you do not succeed at your attempt to quit and have a cigarette. You can still continue your quit attempt and keep using the product as directed. Just throw away your cigarettes and get back to your quit plan. You can keep the patch in place during swimming, bathing, and showering. If your patch falls off during these activities, replace it. When you first apply the patch, your skin may itch or burn. This should go away soon. When you remove a patch, the skin may look red, but this should only last for a few days. Call your doctor or health care professional  if skin redness does not go away after 4 days, if your skin swells, or if you get a rash. If you are a diabetic and you quit smoking, the effects of insulin may be increased and you may need to reduce your insulin dose. Check with your doctor or health care professional about how you should adjust your insulin dose. If you are going to have a magnetic resonance imaging (MRI) procedure, tell your  MRI technician if you have this patch on your body. It must be removed before a MRI. What side effects may I notice from receiving this medicine? Side effects that you should report to your doctor or health care professional as soon as possible: -allergic reactions like skin rash, itching or hives, swelling of the face, lips, or tongue -breathing problems -changes in hearing -changes in vision -chest pain -cold sweats -confusion -fast, irregular heartbeat -feeling faint or lightheaded, falls -headache -increased saliva -skin redness that lasts more than 4 days -stomach pain -signs and symptoms of nicotine overdose like nausea; vomiting; dizziness; weakness; and rapid heartbeat Side effects that usually do not require medical attention (report to your doctor or health care professional if they continue or are bothersome): -diarrhea -dry mouth -hiccups -irritability -nervousness or restlessness -trouble sleeping or vivid dreams This list may not describe all possible side effects. Call your doctor for medical advice about side effects. You may report side effects to FDA at 1-800-FDA-1088. Where should I keep my medicine? Keep out of the reach of children. Store at room temperature between 20 and 25 degrees C (68 and 77 degrees F). Protect from heat and light. Store in manufacturers packaging until ready to use. Throw away unused medicine after the expiration date. When you remove a patch, fold with sticky sides together; put in an empty opened pouch and throw away. NOTE: This sheet is a summary. It may not cover all possible information. If you have questions about this medicine, talk to your doctor, pharmacist, or health care provider.  2018 Elsevier/Gold Standard (2014-02-03 15:46:21)  

## 2016-09-19 NOTE — Progress Notes (Signed)
Subjective:  Patient ID: Jason Wong, male    DOB: 03/04/1956  Age: 60 y.o. MRN: [id number]  CC: Hypertension   HPI Jason Wong presents for follow-up of elevated blood pressure. He is not exercising and is not adherent to low salt diet.  He does not check BP at home. Cardiac symptoms none. Patient denies chest pain, chest pressure/discomfort, claudication, dyspnea, lower extremity edema, near-syncope, palpitations and syncope.  Cardiovascular risk factors: advanced age (older than 63 for men, 38 for women), hypertension, male gender, sedentary lifestyle and smoking/ tobacco exposure. Use of agents associated with hypertension: none. History of target organ damage: none.Current everyday cigarette smoker since age 82. He currently smokes 3 packs per day. He reports congested cough for 6 months. He denies any URI symptoms, hemoptysis, or unexplained weight loss. He reports symptom of pruritic that is worse at night for several months. Reports relieving factor is hot showers.  Declines resources for substance dependence at this time. He reports FH of HTN and cancer. HTN : Mother and Sister. Cancer: Sister-Breast and Brother colon.Difficulty sleeping for 1 year. 4 to 5 hours of sleep per night on average. Taking nothing for symptoms. Denies any anxiety, depression, or SI/HI.  Outpatient Medications Prior to Visit  Medication Sig Dispense Refill  . albuterol (PROVENTIL HFA;VENTOLIN HFA) 108 (90 Base) MCG/ACT inhaler Inhale 2 puffs into the lungs every 6 (six) hours as needed for wheezing or shortness of breath. 45 g 3  . amLODipine (NORVASC) 5 MG tablet Take 1 tablet (5 mg total) by mouth daily. 90 tablet 3  . HYDROcodone-acetaminophen (NORCO) 5-325 MG tablet Take 1 tablet by mouth every 4 (four) hours as needed for severe pain. (Patient not taking: Reported on 06/10/2016) 15 tablet 0  . losartan (COZAAR) 25 MG tablet Take 1 tablet (25 mg total) by mouth daily. 30 tablet 2  . montelukast (SINGULAIR) 10  MG tablet Take 1 tablet (10 mg total) by mouth at bedtime. 30 tablet 6  . naproxen (NAPROSYN) 375 MG tablet Take 1 tablet (375 mg total) by mouth 2 (two) times daily with a meal. (Patient not taking: Reported on 06/10/2016) 14 tablet 0  . vitamin E 1000 UNIT capsule Take 1,000 Units by mouth daily.    Marland Kitchen guaiFENesin (MUCINEX) 600 MG 12 hr tablet Take 1 tablet (600 mg total) by mouth 2 (two) times daily. (Patient not taking: Reported on 06/24/2016) 14 tablet 0  . nicotine (NICODERM CQ - DOSED IN MG/24 HOURS) 21 mg/24hr patch Place 1 patch (21 mg total) onto the skin daily. 28 patch 1   No facility-administered medications prior to visit.     ROS Review of Systems  Constitutional: Negative.   HENT: Negative.   Eyes: Negative.   Respiratory: Positive for cough.   Cardiovascular: Negative.   Gastrointestinal: Negative.   Psychiatric/Behavioral: Positive for sleep disturbance.    Objective:  BP 96/61 (BP Location: Left Arm, Patient Position: Sitting, Cuff Size: Normal)   Pulse 69   Temp 98 F (36.7 C) (Oral)   Resp 18   Ht 5\' 8"  (1.727 m)   Wt 123 lb 6.4 oz (56 kg)   SpO2 98%   BMI 18.76 kg/m   BP/Weight 09/19/2016 06/24/2016 1/51/7616  Systolic BP 96 073 710  Diastolic BP 61 64 91  Wt. (Lbs) 123.4 - 130.2  BMI 18.76 - 19.23   Physical Exam  Constitutional: He is oriented to person, place, and time. He appears well-developed and well-nourished.  HENT:  Head: Normocephalic  and atraumatic.  Right Ear: External ear normal.  Left Ear: External ear normal.  Nose: Nose normal.  Mouth/Throat: Oropharynx is clear and moist.  Eyes: Conjunctivae are normal. Pupils are equal, round, and reactive to light.  Neck: Normal range of motion. Neck supple. No JVD present.  Cardiovascular: Normal rate, regular rhythm, normal heart sounds and intact distal pulses.   Pulmonary/Chest: Effort normal and breath sounds normal.  Abdominal: Soft. Bowel sounds are normal. There is no tenderness.    Lymphadenopathy:    He has no cervical adenopathy.  Neurological: He is alert and oriented to person, place, and time.  Skin: Skin is warm and dry. No rash noted.  Psychiatric: His mood appears anxious. He expresses no homicidal and no suicidal ideation. He expresses no suicidal plans and no homicidal plans.  Nursing note and vitals reviewed.  Assessment & Plan:   Problem List Items Addressed This Visit      Cardiovascular and Mediastinum   Essential hypertension - Primary     Other   Current every day smoker   Relevant Medications   nicotine (NICODERM CQ - DOSED IN MG/24 HOURS) 21 mg/24hr patch   Other Relevant Orders   CT CHEST LUNG CA SCREEN LOW DOSE W/O CM    Other Visit Diagnoses    Scabies       Relevant Medications   permethrin (ELIMITE) 5 % cream   Chronic cough       Relevant Medications   guaiFENesin (MUCINEX) 600 MG 12 hr tablet   guaiFENesin-dextromethorphan (ROBITUSSIN DM) 100-10 MG/5ML syrup   Other Relevant Orders   DG Chest 2 View   Healthcare maintenance       Relevant Orders   Ambulatory referral to Gastroenterology   Cancer screening       Relevant Orders   CT CHEST LUNG CA SCREEN LOW DOSE W/O CM   Primary insomnia       Relevant Medications   traZODone (DESYREL) 50 MG tablet      Meds ordered this encounter  Medications  . permethrin (ELIMITE) 5 % cream    Sig: Apply cream from  head to soles of feet; leave on for 8 to 14 hours then shower. May repeat again in 14 days if needed.    Dispense:  60 g    Refill:  0    Order Specific Question:   Supervising Provider    Answer:   Tresa Garter [id number]  . nicotine (NICODERM CQ - DOSED IN MG/24 HOURS) 21 mg/24hr patch    Sig: Place 1 patch (21 mg total) onto the skin daily.    Dispense:  28 patch    Refill:  1    Order Specific Question:   Supervising Provider    Answer:   Tresa Garter [id number]  . traZODone (DESYREL) 50 MG tablet    Sig: Take 0.5-1 tablets (25-50 mg total) by  mouth at bedtime as needed for sleep.    Dispense:  30 tablet    Refill:  0    Order Specific Question:   Supervising Provider    Answer:   Tresa Garter [id number]  . guaiFENesin (MUCINEX) 600 MG 12 hr tablet    Sig: Take 1 tablet (600 mg total) by mouth 2 (two) times daily.    Dispense:  14 tablet    Refill:  0    Order Specific Question:   Supervising Provider    Answer:   Tresa Garter [id number]  .  guaiFENesin-dextromethorphan (ROBITUSSIN DM) 100-10 MG/5ML syrup    Sig: Take 5 mLs by mouth every 4 (four) hours as needed for cough.    Dispense:  236 mL    Refill:  0    Order Specific Question:   Supervising Provider    Answer:   Tresa Garter [[id number]]    Follow-up: Return in about 2 weeks (around 10/03/2016).   Alfonse Spruce FNP

## 2016-09-29 ENCOUNTER — Ambulatory Visit (HOSPITAL_COMMUNITY): Payer: Medicare HMO | Attending: Family Medicine

## 2016-10-04 ENCOUNTER — Ambulatory Visit: Payer: Medicare HMO | Admitting: Family Medicine

## 2016-10-19 ENCOUNTER — Other Ambulatory Visit: Payer: Self-pay | Admitting: Family Medicine

## 2016-10-19 DIAGNOSIS — J45909 Unspecified asthma, uncomplicated: Secondary | ICD-10-CM

## 2016-10-19 MED FILL — VENTOLIN HFA 90 MCG INHALER: 108 (90 BAS | 25 days supply | Qty: 18 | Fill #2

## 2016-10-26 ENCOUNTER — Other Ambulatory Visit: Payer: Self-pay | Admitting: Family Medicine

## 2016-10-26 DIAGNOSIS — I10 Essential (primary) hypertension: Secondary | ICD-10-CM

## 2016-10-26 MED FILL — LOSARTAN POTASSIUM 25 MG TA: 25 | 30 days supply | Qty: 30 | Fill #2

## 2016-10-26 MED FILL — AMLODIPINE BESYLATE 5 MG TA: 5 | 30 days supply | Qty: 30 | Fill #2

## 2016-11-22 ENCOUNTER — Ambulatory Visit: Payer: Medicare HMO | Admitting: Family Medicine

## 2016-12-21 ENCOUNTER — Other Ambulatory Visit: Payer: Self-pay | Admitting: Family Medicine

## 2016-12-21 DIAGNOSIS — J45909 Unspecified asthma, uncomplicated: Secondary | ICD-10-CM

## 2016-12-21 MED FILL — AMLODIPINE BESYLATE 5 MG TA: 5 | 30 days supply | Qty: 30 | Fill #3

## 2016-12-21 MED FILL — VENTOLIN HFA 90 MCG INHALER: 108 (90 BAS | 25 days supply | Qty: 18 | Fill #0

## 2016-12-23 ENCOUNTER — Other Ambulatory Visit: Payer: Self-pay | Admitting: Family Medicine

## 2016-12-23 DIAGNOSIS — I10 Essential (primary) hypertension: Secondary | ICD-10-CM

## 2016-12-23 MED FILL — MONTELUKAST SOD 10 MG TAB: 10 | 30 days supply | Qty: 30 | Fill #2

## 2016-12-23 MED FILL — LOSARTAN POTASSIUM 25 MG TA: 25 | 30 days supply | Qty: 30 | Fill #0

## 2016-12-23 MED FILL — traZODone HCL 50 MG TABS: 50 | 30 days supply | Qty: 30 | Fill #0

## 2017-01-20 ENCOUNTER — Other Ambulatory Visit: Payer: Self-pay | Admitting: Family Medicine

## 2017-01-20 DIAGNOSIS — F5101 Primary insomnia: Secondary | ICD-10-CM

## 2017-01-20 DIAGNOSIS — I10 Essential (primary) hypertension: Secondary | ICD-10-CM

## 2017-01-20 MED FILL — traZODone HCL 50 MG TABS: 50 | 30 days supply | Qty: 30 | Fill #0

## 2017-01-20 MED FILL — AMLODIPINE BESYLATE 5 MG TA: 5 | 30 days supply | Qty: 30 | Fill #4

## 2017-01-20 MED FILL — MONTELUKAST SOD 10 MG TAB: 10 | 30 days supply | Qty: 30 | Fill #3

## 2017-01-20 MED FILL — VENTOLIN HFA 90 MCG INHALER: 108 (90 BAS | 25 days supply | Qty: 18 | Fill #1

## 2017-01-20 MED FILL — LOSARTAN POTASSIUM 25 MG TA: 25 | 30 days supply | Qty: 30 | Fill #0

## 2017-01-24 ENCOUNTER — Encounter: Payer: Self-pay | Admitting: Family Medicine

## 2017-01-24 ENCOUNTER — Ambulatory Visit: Payer: Medicare HMO | Attending: Family Medicine | Admitting: Family Medicine

## 2017-01-24 VITALS — BP 115/74 | HR 68 | Temp 98.2°F | Resp 18 | Ht 69.0 in | Wt 126.4 lb

## 2017-01-24 DIAGNOSIS — R630 Anorexia: Secondary | ICD-10-CM | POA: Diagnosis not present

## 2017-01-24 DIAGNOSIS — K029 Dental caries, unspecified: Secondary | ICD-10-CM | POA: Diagnosis not present

## 2017-01-24 DIAGNOSIS — F17210 Nicotine dependence, cigarettes, uncomplicated: Secondary | ICD-10-CM | POA: Insufficient documentation

## 2017-01-24 DIAGNOSIS — R05 Cough: Secondary | ICD-10-CM | POA: Diagnosis not present

## 2017-01-24 DIAGNOSIS — I10 Essential (primary) hypertension: Secondary | ICD-10-CM | POA: Diagnosis not present

## 2017-01-24 DIAGNOSIS — Z1321 Encounter for screening for nutritional disorder: Secondary | ICD-10-CM | POA: Diagnosis not present

## 2017-01-24 DIAGNOSIS — J45909 Unspecified asthma, uncomplicated: Secondary | ICD-10-CM | POA: Insufficient documentation

## 2017-01-24 DIAGNOSIS — Z79899 Other long term (current) drug therapy: Secondary | ICD-10-CM | POA: Diagnosis not present

## 2017-01-24 DIAGNOSIS — R7303 Prediabetes: Secondary | ICD-10-CM

## 2017-01-24 DIAGNOSIS — R053 Chronic cough: Secondary | ICD-10-CM

## 2017-01-24 DIAGNOSIS — F17200 Nicotine dependence, unspecified, uncomplicated: Secondary | ICD-10-CM

## 2017-01-24 DIAGNOSIS — R5383 Other fatigue: Secondary | ICD-10-CM | POA: Insufficient documentation

## 2017-01-24 DIAGNOSIS — Z23 Encounter for immunization: Secondary | ICD-10-CM | POA: Diagnosis not present

## 2017-01-24 DIAGNOSIS — Z1211 Encounter for screening for malignant neoplasm of colon: Secondary | ICD-10-CM

## 2017-01-24 DIAGNOSIS — H547 Unspecified visual loss: Secondary | ICD-10-CM | POA: Diagnosis not present

## 2017-01-24 LAB — POCT UA - MICROALBUMIN
Creatinine, POC: 200 mg/dL
Microalbumin Ur, POC: 80 mg/L

## 2017-01-24 LAB — POCT GLYCOSYLATED HEMOGLOBIN (HGB A1C): Hemoglobin A1C: 5.7

## 2017-01-24 MED ORDER — MONTELUKAST SODIUM 10 MG PO TABS: 10.0000 mg | ORAL_TABLET | Freq: Every day | ORAL | 11 refills | Status: DC

## 2017-01-24 MED ORDER — LOSARTAN POTASSIUM 25 MG PO TABS: 25.0000 mg | ORAL_TABLET | Freq: Every day | ORAL | 3 refills | Status: DC

## 2017-01-24 MED ORDER — IBUPROFEN 600 MG PO TABS: 600.0000 mg | ORAL_TABLET | Freq: Three times a day (TID) | ORAL | 0 refills | Status: DC | PRN

## 2017-01-24 MED ORDER — FLUTICASONE PROPIONATE HFA 44 MCG/ACT IN AERO: 1.0000 | INHALATION_SPRAY | Freq: Two times a day (BID) | RESPIRATORY_TRACT | 11 refills | Status: DC

## 2017-01-24 MED ORDER — AMLODIPINE BESYLATE 5 MG PO TABS: 5.0000 mg | ORAL_TABLET | Freq: Every day | ORAL | 3 refills | Status: DC

## 2017-01-24 MED ORDER — VENTOLIN HFA 108 (90 BASE) MCG/ACT IN AERS: INHALATION_SPRAY | RESPIRATORY_TRACT | 1 refills | Status: DC

## 2017-01-24 MED ORDER — BENZOCAINE 10 % MT GEL: 1.0000 "application " | OROMUCOSAL | 0 refills | Status: DC | PRN

## 2017-01-24 MED ORDER — ENSURE COMPLETE PO LIQD: 237.0000 mL | Freq: Two times a day (BID) | ORAL | Status: DC | PRN

## 2017-01-24 MED FILL — FLOVENT HFA 44 MCG INHALER: 44 | 60 days supply | Qty: 11 | Fill #0

## 2017-01-24 MED FILL — IBUPROFEN 600 MG TABS: 600 | 10 days supply | Qty: 30 | Fill #0

## 2017-01-24 NOTE — Progress Notes (Signed)
Subjective:  Patient ID: Jason Wong, male    DOB: 1956/01/30  Age: 61 y.o. MRN: [id number]  CC: Hypertension   HPI Jason Wong presents for follow-up of elevated blood pressure. He is not exercising and is not adherent to low salt diet.  He does not check BP at home. Cardiac symptoms none. Patient denies chest pain, chest pressure/discomfort, claudication, dyspnea, lower extremity edema, near-syncope, palpitations and syncope.  Cardiovascular risk factors: advanced age (older than 62 for men, 58 for women), hypertension, male gender, sedentary lifestyle and smoking/ tobacco exposure. Use of agents associated with hypertension: none. History of target organ damage: none.Current everyday cigarette smoker since age 39. He currently smokes 10 cigarettes per day. History of asthma. He reports chronic dry cough for 1 month. He denies any fevers,chills, or hemoptysis. Reports episode of wheezing 1 week ago with activity. Albuterol usage everyday. Decreased visual acuity: Onset greater than 3 months ago. He requests referral. Poor appetite: Onset 1 month ago. He denies any abdominal pain, unintentional weight loss, hematochezia, or melena. He denies any stressors. Dental pain/decay: Onset greater than 1 year ago. Denis any drainage, facial or tongue swelling. He request dental referral.  Outpatient Medications Prior to Visit  Medication Sig Dispense Refill  . guaiFENesin-dextromethorphan (ROBITUSSIN DM) 100-10 MG/5ML syrup Take 5 mLs by mouth every 4 (four) hours as needed for cough. 236 mL 0  . HYDROcodone-acetaminophen (NORCO) 5-325 MG tablet Take 1 tablet by mouth every 4 (four) hours as needed for severe pain. (Patient not taking: Reported on 06/10/2016) 15 tablet 0  . naproxen (NAPROSYN) 375 MG tablet Take 1 tablet (375 mg total) by mouth 2 (two) times daily with a meal. (Patient not taking: Reported on 06/10/2016) 14 tablet 0  . nicotine (NICODERM CQ - DOSED IN MG/24 HOURS) 21 mg/24hr patch Place 1  patch (21 mg total) onto the skin daily. 28 patch 1  . permethrin (ELIMITE) 5 % cream Apply cream from  head to soles of feet; leave on for 8 to 14 hours then shower. May repeat again in 14 days if needed. 60 g 0  . traZODone (DESYREL) 50 MG tablet TAKE 1/2 TO 1 TABLET BY MOUTH AT BEDTIME AS NEEDED FOR SLEEP. 30 tablet 0  . vitamin E 1000 UNIT capsule Take 1,000 Units by mouth daily.    Marland Kitchen amLODipine (NORVASC) 5 MG tablet Take 1 tablet (5 mg total) by mouth daily. 90 tablet 3  . guaiFENesin (MUCINEX) 600 MG 12 hr tablet Take 1 tablet (600 mg total) by mouth 2 (two) times daily. 14 tablet 0  . losartan (COZAAR) 25 MG tablet TAKE 1 TABLET BY MOUTH DAILY. 30 tablet 0  . montelukast (SINGULAIR) 10 MG tablet Take 1 tablet (10 mg total) by mouth at bedtime. 30 tablet 6  . VENTOLIN HFA 108 (90 Base) MCG/ACT inhaler INHALE 2 PUFFS INTO THE LUNGS EVERY 6 HOURS AS NEEDED FOR WHEEZING OR SHORTNESS OF BREATH. 18 g 1   No facility-administered medications prior to visit.     ROS Review of Systems  Constitutional: Negative.   HENT: Positive for dental problem.   Eyes: Positive for visual disturbance.  Respiratory: Positive for cough.   Cardiovascular: Negative.   Gastrointestinal: Negative.     Objective:  BP 115/74 (BP Location: Left Arm, Patient Position: Sitting, Cuff Size: Normal)   Pulse 68   Temp 98.2 F (36.8 C) (Oral)   Resp 18   Ht 5\' 9"  (1.753 m)   Wt 126 lb 6.4  oz (57.3 kg)   SpO2 97%   BMI 18.67 kg/m   BP/Weight 01/24/2017 05/20/5174 03/27/735  Systolic BP 106 96 269  Diastolic BP 74 61 64  Wt. (Lbs) 126.4 123.4 -  BMI 18.67 18.76 -   Physical Exam  Constitutional: He is oriented to person, place, and time. He appears well-developed and well-nourished.  HENT:  Head: Normocephalic and atraumatic.  Right Ear: External ear normal.  Left Ear: External ear normal.  Nose: Nose normal.  Mouth/Throat: Oropharynx is clear and moist. Abnormal dentition (extensive decay, missing teeth).   Eyes: Conjunctivae are normal. Pupils are equal, round, and reactive to light.  Neck: Normal range of motion. Neck supple. No JVD present.  Cardiovascular: Normal rate, regular rhythm, normal heart sounds and intact distal pulses.  Pulmonary/Chest: Effort normal and breath sounds normal. He has no wheezes.  Abdominal: Soft. Bowel sounds are normal. There is no tenderness.  Lymphadenopathy:    He has no cervical adenopathy.  Neurological: He is alert and oriented to person, place, and time.  Skin: Skin is warm and dry. No rash noted.  Psychiatric: His mood appears anxious. He expresses no homicidal and no suicidal ideation. He expresses no suicidal plans and no homicidal plans.  Nursing note and vitals reviewed.  Assessment & Plan:   1. Essential hypertension  - amLODipine (NORVASC) 5 MG tablet; Take 1 tablet (5 mg total) daily by mouth.  Dispense: 90 tablet; Refill: 3 - losartan (COZAAR) 25 MG tablet; Take 1 tablet (25 mg total) daily by mouth.  Dispense: 90 tablet; Refill: 3 - POCT UA - Microalbumin  2. Mild asthma without complication, unspecified whether persistent  - montelukast (SINGULAIR) 10 MG tablet; Take 1 tablet (10 mg total) at bedtime by mouth.  Dispense: 30 tablet; Refill: 11 - VENTOLIN HFA 108 (90 Base) MCG/ACT inhaler; INHALE 2 PUFFS INTO THE LUNGS EVERY 6 HOURS AS NEEDED FOR WHEEZING OR SHORTNESS OF BREATH.  Dispense: 18 g; Refill: 1 - fluticasone (FLOVENT HFA) 44 MCG/ACT inhaler; Inhale 1 puff 2 (two) times daily into the lungs.  Dispense: 1 Inhaler; Refill: 11  3. Chronic cough  - DG Chest 2 View; Future - CBC  4. Fatigue, unspecified type  - DG Chest 2 View; Future - CBC - CMP and Liver - Vitamin D, 25-hydroxy  5. Poor appetite  - Ambulatory referral to Gastroenterology - CBC - CMP and Liver - feeding supplement, ENSURE COMPLETE, (ENSURE COMPLETE) LIQD; Take 237 mLs 2 (two) times daily between meals as needed by mouth.  6. Prediabetes  - HgB  A1c  7. Pain due to dental caries  - Ambulatory referral to Dentistry - ibuprofen (ADVIL,MOTRIN) 600 MG tablet; Take 1 tablet (600 mg total) every 8 (eight) hours as needed by mouth.  Dispense: 30 tablet; Refill: 0 - benzocaine (ORAJEL) 10 % mucosal gel; Use as directed 1 application as needed in the mouth or throat for mouth pain.  Dispense: 5.3 g; Refill: 0  8. Needs flu shot  - Flu Vaccine QUAD 6+ mos PF IM (Fluarix Quad PF)  9. Decreased visual acuity Visual acuity screen - Ambulatory referral to Ophthalmology  10. Dental decay  - Ambulatory referral to Dentistry  11. Screening for colon cancer  - Ambulatory referral to Gastroenterology  12. Current smoker  - DG Chest 2 View; Future    Follow-up: Return in about 3 months (around 04/26/2017), or if symptoms worsen or fail to improve, for HTN/Asthma.   Alfonse Spruce FNP

## 2017-01-25 LAB — CMP AND LIVER
ALT: 147 [IU]/L — ABNORMAL HIGH (ref 0–44)
AST: 148 [IU]/L — ABNORMAL HIGH (ref 0–40)
Albumin: 4.6 g/dL (ref 3.6–4.8)
Alkaline Phosphatase: 99 [IU]/L (ref 39–117)
BUN: 9 mg/dL (ref 8–27)
Bilirubin Total: 0.7 mg/dL (ref 0.0–1.2)
Bilirubin, Direct: 0.26 mg/dL (ref 0.00–0.40)
CO2: 25 mmol/L (ref 20–29)
Calcium: 9.5 mg/dL (ref 8.6–10.2)
Chloride: 101 mmol/L (ref 96–106)
Creatinine, Ser: 1 mg/dL (ref 0.76–1.27)
GFR calc Af Amer: 93 mL/min/{1.73_m2} (ref >59)
GFR calc non Af Amer: 81 mL/min/{1.73_m2} (ref >59)
Glucose: 87 mg/dL (ref 65–99)
Potassium: 3.9 mmol/L (ref 3.5–5.2)
Sodium: 140 mmol/L (ref 134–144)
Total Protein: 8.1 g/dL (ref 6.0–8.5)

## 2017-01-25 LAB — CBC
Hematocrit: 46.2 % (ref 37.5–51.0)
Hemoglobin: 15.7 g/dL (ref 13.0–17.7)
MCH: 34.4 pg — ABNORMAL HIGH (ref 26.6–33.0)
MCHC: 34 g/dL (ref 31.5–35.7)
MCV: 101 fL — ABNORMAL HIGH (ref 79–97)
Platelets: 253 10*3/uL (ref 150–379)
RBC: 4.56 x10E6/uL (ref 4.14–5.80)
RDW: 13.3 % (ref 12.3–15.4)
WBC: 4.1 10*3/uL (ref 3.4–10.8)

## 2017-01-25 LAB — VITAMIN D 25 HYDROXY (VIT D DEFICIENCY, FRACTURES): Vit D, 25-Hydroxy: 18.8 ng/mL — ABNORMAL LOW (ref 30.0–100.0)

## 2017-02-15 ENCOUNTER — Other Ambulatory Visit: Payer: Self-pay | Admitting: Family Medicine

## 2017-02-15 DIAGNOSIS — R945 Abnormal results of liver function studies: Principal | ICD-10-CM

## 2017-02-15 DIAGNOSIS — R630 Anorexia: Secondary | ICD-10-CM

## 2017-02-15 DIAGNOSIS — R5383 Other fatigue: Secondary | ICD-10-CM

## 2017-02-15 DIAGNOSIS — R7989 Other specified abnormal findings of blood chemistry: Secondary | ICD-10-CM

## 2017-02-21 ENCOUNTER — Telehealth: Payer: Self-pay

## 2017-02-21 NOTE — Telephone Encounter (Signed)
-----   Message from Alfonse Spruce, [place] sent at 02/15/2017  6:54 AM EST ----- Labs that evaluated your blood cells, fluid and electrolyte balance are normal. No signs of anemia, infection, or inflammation present. Kidney function normal Liver function labs are elevated. Recommend scheduling lab visit for hepatitis screening.  You will be referred to gastroenterology.

## 2017-02-21 NOTE — Telephone Encounter (Signed)
CMA call regarding lab results   Patient Verify DOB   Patient was aware and understood   CMA transfer call to front so he can get his lab appt for hepatitis screening

## 2017-03-16 ENCOUNTER — Telehealth: Payer: Self-pay

## 2017-03-16 NOTE — Telephone Encounter (Signed)
Letter was returned for the referral to schedule colonoscopy. I tried to call both phone numbers and [phone] does not work. The number [phone] was not his number.

## 2017-03-24 ENCOUNTER — Other Ambulatory Visit: Payer: Self-pay | Admitting: Family Medicine

## 2017-03-24 DIAGNOSIS — F5101 Primary insomnia: Secondary | ICD-10-CM

## 2017-03-24 MED FILL — LOSARTAN POTASSIUM 25 MG TA: 25 | 90 days supply | Qty: 90 | Fill #0

## 2017-03-24 MED FILL — MONTELUKAST SOD 10 MG TAB: 10 | 30 days supply | Qty: 30 | Fill #0

## 2017-03-24 MED FILL — AMLODIPINE BESYLATE 5 MG TA: 5 | 90 days supply | Qty: 90 | Fill #0

## 2017-03-31 ENCOUNTER — Other Ambulatory Visit: Payer: Self-pay | Admitting: Family Medicine

## 2017-04-26 ENCOUNTER — Ambulatory Visit: Payer: Medicare HMO | Admitting: Family Medicine

## 2017-04-26 MED FILL — FLOVENT HFA 44 MCG INHALER: 44 | 60 days supply | Qty: 11 | Fill #1

## 2017-04-26 MED FILL — traZODone HCL 50 MG TABS: 50 | 30 days supply | Qty: 30 | Fill #0

## 2017-04-27 ENCOUNTER — Ambulatory Visit: Payer: Medicare HMO | Attending: Family Medicine

## 2017-04-27 ENCOUNTER — Ambulatory Visit: Payer: Medicare HMO | Admitting: Family Medicine

## 2017-04-27 DIAGNOSIS — R945 Abnormal results of liver function studies: Secondary | ICD-10-CM | POA: Diagnosis not present

## 2017-04-27 DIAGNOSIS — R7989 Other specified abnormal findings of blood chemistry: Secondary | ICD-10-CM

## 2017-04-27 NOTE — Progress Notes (Signed)
Patient here for lab visit only 

## 2017-04-28 ENCOUNTER — Other Ambulatory Visit: Payer: Self-pay | Admitting: Family Medicine

## 2017-04-28 ENCOUNTER — Telehealth: Payer: Self-pay | Admitting: *Deleted

## 2017-04-28 DIAGNOSIS — R768 Other specified abnormal immunological findings in serum: Secondary | ICD-10-CM

## 2017-04-28 LAB — HEPATITIS B SURFACE ANTIGEN: Hepatitis B Surface Ag: NEGATIVE

## 2017-04-28 LAB — HEPATITIS C ANTIBODY: Hep C Virus Ab: >11 {s_co_ratio} — ABNORMAL HIGH (ref 0.0–0.9)

## 2017-04-28 NOTE — Telephone Encounter (Signed)
-----   Message from Alfonse Spruce, [place] sent at 04/28/2017  1:56 PM EST ----- Hepatitis C is positive. -Hepatitis C is caused by a virus which can spread from person to person through contact with blood. This can happen through sex or sharing needles. -Avoid having unprotected sex or any other activity that would expose others to blood to prevent spreading the virus. -Hepatitis C is treatable. You will be referred to Infectious Disease for follow up. Hepatitis B is negative.

## 2017-04-28 NOTE — Telephone Encounter (Signed)
MA unable to reach or leave a message for patient. !!!Please inform patient of Hep C being positive and can be spread through sex, needles and contact with another persons blood. Hep C is treatable and he has been referred to infectious disease for follow up. Hep B was negative!!!

## 2017-05-02 ENCOUNTER — Ambulatory Visit: Payer: Medicare HMO | Admitting: Family Medicine

## 2017-05-08 ENCOUNTER — Ambulatory Visit: Payer: Medicare HMO | Admitting: Family Medicine

## 2017-05-15 ENCOUNTER — Other Ambulatory Visit: Payer: Self-pay | Admitting: Family Medicine

## 2017-05-16 ENCOUNTER — Other Ambulatory Visit: Payer: Self-pay | Admitting: Family Medicine

## 2017-05-16 ENCOUNTER — Telehealth: Payer: Self-pay | Admitting: Family Medicine

## 2017-05-16 NOTE — Telephone Encounter (Signed)
History Hep C, was referred to ID who has tried to contact patient unsuccessfully. Attempted to call and phone number is not in service.

## 2017-05-16 NOTE — Telephone Encounter (Signed)
Attempted to contact patient's emergency contact however phone is not in service.

## 2017-06-22 MED FILL — LOSARTAN POTASSIUM 25 MG TA: 25 | 90 days supply | Qty: 90 | Fill #1

## 2017-06-22 MED FILL — FLOVENT HFA 44 MCG INHALER: 44 | 60 days supply | Qty: 11 | Fill #2

## 2017-06-22 MED FILL — AMLODIPINE BESYLATE 5 MG TA: 5 | 90 days supply | Qty: 90 | Fill #1

## 2017-06-22 MED FILL — MONTELUKAST SOD 10 MG TAB: 10 | 30 days supply | Qty: 30 | Fill #1

## 2017-06-27 ENCOUNTER — Encounter: Payer: Self-pay | Admitting: Infectious Diseases

## 2017-06-27 ENCOUNTER — Ambulatory Visit (INDEPENDENT_AMBULATORY_CARE_PROVIDER_SITE_OTHER): Payer: Medicare HMO | Admitting: Infectious Diseases

## 2017-06-27 ENCOUNTER — Other Ambulatory Visit (HOSPITAL_COMMUNITY)
Admission: RE | Admit: 2017-06-27 | Discharge: 2017-06-27 | Disposition: A | Discharge status: Home / Self Care | Payer: Medicare HMO | Source: Ambulatory Visit | Attending: Infectious Diseases | Admitting: Infectious Diseases

## 2017-06-27 ENCOUNTER — Other Ambulatory Visit: Payer: Self-pay | Admitting: Pharmacist

## 2017-06-27 VITALS — BP 111/73 | HR 70 | Temp 98.6°F | Wt 122.0 lb

## 2017-06-27 DIAGNOSIS — B182 Chronic viral hepatitis C: Secondary | ICD-10-CM | POA: Diagnosis not present

## 2017-06-27 DIAGNOSIS — I10 Essential (primary) hypertension: Secondary | ICD-10-CM

## 2017-06-27 DIAGNOSIS — F5101 Primary insomnia: Secondary | ICD-10-CM

## 2017-06-27 DIAGNOSIS — F17200 Nicotine dependence, unspecified, uncomplicated: Secondary | ICD-10-CM

## 2017-06-27 DIAGNOSIS — G4700 Insomnia, unspecified: Secondary | ICD-10-CM | POA: Insufficient documentation

## 2017-06-27 NOTE — Progress Notes (Signed)
   Subjective:    Patient ID: Jason Wong, male    DOB: 09/21/1955, 62 y.o.   MRN: [id number]  HPI 62 yo M with hx of mild asthma, HTN, comes to ID for eval of Hep C.  "I don't know why i'm here".  He is unlcear about his Hep C status. Last time they found "a little touch of it, next time they said it was alright".  His labs are notable for elevated AST and ALT. His Hep C Ab+.  Not clear of his risk- no tattoo's, prev crack cocaine, current 2 beers/day.    The past medical history, family history and social history were reviewed/updated in EPIC   Review of Systems  Constitutional: Negative for appetite change, chills, fever and unexpected weight change.  Eyes:       No icterus  Respiratory: Positive for cough, shortness of breath and wheezing.   Cardiovascular: Negative for chest pain.  Gastrointestinal: Negative for constipation and diarrhea.       Has noted his stool being lighter  Genitourinary: Negative for difficulty urinating.  Neurological: Negative for headaches.  Hematological: Does not bruise/bleed easily.  Psychiatric/Behavioral: Positive for sleep disturbance.  Please see HPI. All other systems reviewed and negative.      Objective:   Physical Exam  Constitutional: He appears well-developed and well-nourished.  HENT:  Mouth/Throat: No oropharyngeal exudate.  Eyes: Pupils are equal, round, and reactive to light. EOM are normal.  Neck: Neck supple.  Cardiovascular: Normal rate, regular rhythm and normal heart sounds.  Pulmonary/Chest: Effort normal and breath sounds normal.  Abdominal: Soft. Bowel sounds are normal. There is no tenderness. There is no rebound.  Musculoskeletal: He exhibits no edema.  Lymphadenopathy:    He has no cervical adenopathy.  Psychiatric: He has a normal mood and affect.       Assessment & Plan:

## 2017-06-27 NOTE — Assessment & Plan Note (Signed)
Encouraged to quit. 

## 2017-06-27 NOTE — Assessment & Plan Note (Addendum)
He has increased LFTs, not clear if this is ETOH, Hep C or both.  Will check his Hep C RNA Check his Hep A and B Check his HIV Not clear if he is immune or if he has active hep C.  Will check u/s if his Hep RNA is positive D/i pharm rtc in 2-3 weeks.

## 2017-06-27 NOTE — Assessment & Plan Note (Addendum)
Well controlled today, cozaar, amlodipine.

## 2017-06-27 NOTE — Assessment & Plan Note (Signed)
He has concerns that his prev rx did not work Will f/u with PCP.

## 2017-06-28 LAB — HEPATITIS B SURFACE ANTIGEN: Hepatitis B Surface Ag: NONREACTIVE

## 2017-06-28 LAB — COMPREHENSIVE METABOLIC PANEL
AG Ratio: 1.5 (calc) (ref 1.0–2.5)
ALT: 164 U/L — ABNORMAL HIGH (ref 9–46)
AST: 188 U/L — ABNORMAL HIGH (ref 10–35)
Albumin: 4.6 g/dL (ref 3.6–5.1)
Alkaline phosphatase (APISO): 105 U/L (ref 40–115)
BUN: 13 mg/dL (ref 7–25)
CO2: 30 mmol/L (ref 20–32)
Calcium: 10.2 mg/dL (ref 8.6–10.3)
Chloride: 107 mmol/L (ref 98–110)
Creat: 1.2 mg/dL (ref 0.70–1.25)
Globulin: 3.1 g/dL (ref 1.9–3.7)
Glucose, Bld: 101 mg/dL — ABNORMAL HIGH (ref 65–99)
Potassium: 4.9 mmol/L (ref 3.5–5.3)
Sodium: 144 mmol/L (ref 135–146)
Total Bilirubin: 0.8 mg/dL (ref 0.2–1.2)
Total Protein: 7.7 g/dL (ref 6.1–8.1)

## 2017-06-28 LAB — CBC
HCT: 46.7 % (ref 38.5–50.0)
Hemoglobin: 16.6 g/dL (ref 13.2–17.1)
MCH: 33.7 pg — ABNORMAL HIGH (ref 27.0–33.0)
MCHC: 35.5 g/dL (ref 32.0–36.0)
MCV: 94.9 fL (ref 80.0–100.0)
MPV: 10.2 fL (ref 7.5–12.5)
Platelets: 236 10*3/uL (ref 140–400)
RBC: 4.92 10*6/uL (ref 4.20–5.80)
RDW: 12 % (ref 11.0–15.0)
WBC: 5.2 10*3/uL (ref 3.8–10.8)

## 2017-06-28 LAB — FLUORESCENT TREPONEMAL AB(FTA)-IGG-BLD: Fluorescent Treponemal ABS: NONREACTIVE

## 2017-06-28 LAB — URINE CYTOLOGY ANCILLARY ONLY
Chlamydia: NEGATIVE
Neisseria Gonorrhea: NEGATIVE

## 2017-06-28 LAB — PROTIME-INR
INR: 1.1
Prothrombin Time: 11.5 s (ref 9.0–11.5)

## 2017-06-28 LAB — HEPATITIS B SURFACE ANTIBODY,QUALITATIVE: Hep B S Ab: NONREACTIVE

## 2017-06-28 LAB — RPR: RPR Ser Ql: REACTIVE — AB

## 2017-06-28 LAB — HIV ANTIBODY (ROUTINE TESTING W REFLEX): HIV 1&2 Ab, 4th Generation: NONREACTIVE

## 2017-06-28 LAB — RPR TITER: RPR Titer: 1:1 {titer} — ABNORMAL HIGH

## 2017-06-28 LAB — HEPATITIS A ANTIBODY, TOTAL: Hepatitis A AB,Total: NONREACTIVE

## 2017-06-30 LAB — HEPATITIS C RNA QUANTITATIVE
HCV Quantitative Log: 5.43 {Log_IU}/mL — ABNORMAL HIGH
HCV RNA, PCR, QN: 269000 [IU]/mL — ABNORMAL HIGH

## 2017-06-30 LAB — HEPATITIS C GENOTYPE

## 2017-07-26 ENCOUNTER — Ambulatory Visit: Payer: Medicare HMO | Admitting: Nurse Practitioner

## 2017-07-26 MED FILL — VENTOLIN HFA 90 MCG INHALER: 108 (90 BAS | 18 days supply | Qty: 18 | Fill #0

## 2017-08-16 ENCOUNTER — Ambulatory Visit: Payer: Medicare HMO | Admitting: Infectious Diseases

## 2017-08-24 ENCOUNTER — Ambulatory Visit: Payer: Medicare HMO | Attending: Internal Medicine | Admitting: Internal Medicine

## 2017-08-24 ENCOUNTER — Encounter: Payer: Self-pay | Admitting: Internal Medicine

## 2017-08-24 VITALS — BP 100/69 | HR 68 | Temp 97.6°F | Resp 16 | Ht 64.0 in | Wt 116.6 lb

## 2017-08-24 DIAGNOSIS — Z122 Encounter for screening for malignant neoplasm of respiratory organs: Secondary | ICD-10-CM | POA: Diagnosis not present

## 2017-08-24 DIAGNOSIS — Z1211 Encounter for screening for malignant neoplasm of colon: Secondary | ICD-10-CM

## 2017-08-24 DIAGNOSIS — F5101 Primary insomnia: Secondary | ICD-10-CM

## 2017-08-24 DIAGNOSIS — G4700 Insomnia, unspecified: Secondary | ICD-10-CM | POA: Diagnosis not present

## 2017-08-24 DIAGNOSIS — F17210 Nicotine dependence, cigarettes, uncomplicated: Secondary | ICD-10-CM | POA: Insufficient documentation

## 2017-08-24 DIAGNOSIS — J4530 Mild persistent asthma, uncomplicated: Secondary | ICD-10-CM | POA: Diagnosis not present

## 2017-08-24 DIAGNOSIS — J45909 Unspecified asthma, uncomplicated: Secondary | ICD-10-CM | POA: Diagnosis not present

## 2017-08-24 DIAGNOSIS — Z7951 Long term (current) use of inhaled steroids: Secondary | ICD-10-CM | POA: Insufficient documentation

## 2017-08-24 DIAGNOSIS — F17200 Nicotine dependence, unspecified, uncomplicated: Secondary | ICD-10-CM

## 2017-08-24 DIAGNOSIS — I10 Essential (primary) hypertension: Secondary | ICD-10-CM | POA: Diagnosis not present

## 2017-08-24 DIAGNOSIS — Z8249 Family history of ischemic heart disease and other diseases of the circulatory system: Secondary | ICD-10-CM | POA: Insufficient documentation

## 2017-08-24 DIAGNOSIS — R634 Abnormal weight loss: Secondary | ICD-10-CM | POA: Diagnosis not present

## 2017-08-24 DIAGNOSIS — B182 Chronic viral hepatitis C: Secondary | ICD-10-CM | POA: Diagnosis not present

## 2017-08-24 DIAGNOSIS — R7303 Prediabetes: Secondary | ICD-10-CM | POA: Diagnosis not present

## 2017-08-24 MED ORDER — VENTOLIN HFA 108 (90 BASE) MCG/ACT IN AERS: INHALATION_SPRAY | RESPIRATORY_TRACT | 11 refills | Status: DC

## 2017-08-24 MED ORDER — TRAZODONE HCL 50 MG PO TABS: 25.0000 mg | ORAL_TABLET | Freq: Every evening | ORAL | 5 refills | Status: DC | PRN

## 2017-08-24 MED ORDER — LOSARTAN POTASSIUM 25 MG PO TABS: 25.0000 mg | ORAL_TABLET | Freq: Every day | ORAL | 3 refills | Status: DC

## 2017-08-24 MED ORDER — AMLODIPINE BESYLATE 5 MG PO TABS: 5.0000 mg | ORAL_TABLET | Freq: Every day | ORAL | 3 refills | Status: DC

## 2017-08-24 MED ORDER — BUDESONIDE-FORMOTEROL FUMARATE 80-4.5 MCG/ACT IN AERO: 2.0000 | INHALATION_SPRAY | Freq: Two times a day (BID) | RESPIRATORY_TRACT | 3 refills | Status: DC

## 2017-08-24 MED ORDER — VARENICLINE TARTRATE 0.5 MG X 11 & 1 MG X 42 PO MISC: ORAL | 0 refills | Status: DC

## 2017-08-24 MED FILL — CHANTIX STARTING MONTH BOX: 0.5 MG X 11 | 30 days supply | Qty: 53 | Fill #0

## 2017-08-24 MED FILL — SYMBICORT 80-4.5 MCG INH: 80-4.5 | 30 days supply | Qty: 10 | Fill #0

## 2017-08-24 MED FILL — VENTOLIN HFA 90 MCG INHALER: 108 (90 BAS | 25 days supply | Qty: 18 | Fill #0

## 2017-08-24 MED FILL — traZODone HCL 50 MG TABS: 50 | 30 days supply | Qty: 30 | Fill #0

## 2017-08-24 NOTE — Progress Notes (Signed)
Patient ID: Jason Wong, male    DOB: 1956/02/23  MRN: [id number]  CC: re-establish and Hypertension   Subjective: Jason Wong is a 62 y.o. male who presents for chronic ds management.  Previous PCP was NP Hairston who has left the practice. His concerns today include: Pt with hx of HTN, tob, asthma and hep C  HTN:  Compliant with Norvasc and Cozaar No CP or LE edema.  SOB which he relates to asthma.    Asthma:  Endorses cough at nights x few mths.  Some wheezing at nights.  He does lawn care. + SOB when he does that. Uses albuterol several times a wk; pt unable to quantify. Not taking Singulair -+ sneezing at times -smokes 1 pk/day ever since the age of 66.  He would like to quit.  Prescribed nicotine patches in the past but never used.  Request RF on Trazodone.  Helps with insomnia.   Noted to have loss 10 lbs since 01/2017.  Reports good appetite. He eats two meals a day - BF and dinner.  Does not snack during the day.  No dysphasia.  No blood in the stools.  He is due for colon cancer screening.  Recent CBC without anemia. -Denies any fever or night sweats.  Recent HIV test done was negative.  Recently diagnosed with hep C and has a follow-up appointment coming up with ID next week -Denies any palpitations or heat intolerance.  Has diagnosis of prediabetes.  Denies any frequent thirst or frequent urination. -Passing his urine okay.  No hematuria.    Patient Active Problem List   Diagnosis Date Noted  . Chronic viral hepatitis C ([place]) 06/27/2017  . Insomnia 06/27/2017  . Prediabetes 01/24/2017  . Asthma 06/10/2016  . Essential hypertension 06/10/2016  . Current every day smoker 06/10/2016     Current Outpatient Medications on File Prior to Visit  Medication Sig Dispense Refill  . feeding supplement, ENSURE COMPLETE, (ENSURE COMPLETE) LIQD Take 237 mLs 2 (two) times daily between meals as needed by mouth.    . vitamin E 1000 UNIT capsule Take 1,000 Units by mouth daily.      No current facility-administered medications on file prior to visit.     No Known Allergies  Social History   Socioeconomic History  . Marital status: Single    Spouse name: Not on file  . Number of children: Not on file  . Years of education: Not on file  . Highest education level: Not on file  Occupational History  . Not on file  Social Needs  . Financial resource strain: Not on file  . Food insecurity:    Worry: Not on file    Inability: Not on file  . Transportation needs:    Medical: Not on file    Non-medical: Not on file  Tobacco Use  . Smoking status: Current Every Day Smoker    Packs/day: 0.50    Years: 45.00    Pack years: 22.50    Types: Cigarettes  . Smokeless tobacco: Never Used  Substance and Sexual Activity  . Alcohol use: Yes    Alcohol/week: 8.4 oz    Types: 14 Cans of beer per week  . Drug use: Not Currently    Types: "Crack" cocaine  . Sexual activity: Yes    Partners: Female  Lifestyle  . Physical activity:    Days per week: Not on file    Minutes per session: Not on file  . Stress:  Not on file  Relationships  . Social connections:    Talks on phone: Not on file    Gets together: Not on file    Attends religious service: Not on file    Active member of club or organization: Not on file    Attends meetings of clubs or organizations: Not on file    Relationship status: Not on file  . Intimate partner violence:    Fear of current or ex partner: Not on file    Emotionally abused: Not on file    Physically abused: Not on file    Forced sexual activity: Not on file  Other Topics Concern  . Not on file  Social History Narrative  . Not on file    Family History  Problem Relation Age of Onset  . Hypertension Mother   . Cancer Sister   . Hypertension Sister   . Cancer Brother     Past Surgical History:  Procedure Laterality Date  . INCISE AND DRAIN ABCESS     dog bite    ROS: Review of Systems Negative except as stated  above PHYSICAL EXAM: BP 100/69   Pulse 68   Temp 97.6 F (36.4 C) (Oral)   Resp 16   Ht 5\' 4"  (1.626 m)   Wt 116 lb 9.6 oz (52.9 kg)   SpO2 97%   BMI 20.01 kg/m   Wt Readings from Last 3 Encounters:  08/24/17 116 lb 9.6 oz (52.9 kg)  06/27/17 122 lb (55.3 kg)  01/24/17 126 lb 6.4 oz (57.3 kg)   Physical Exam  General appearance - alert, older African-American male in NAD  Mental status -he is slightly unkept.  He answers questions appropriately.   Nose - normal and patent, no erythema, discharge or polyps Mouth - mucous membranes moist, pharynx normal without lesions Neck - supple, no significant adenopathy.  No thyroid enlargement. Chest -few rhonchi's at the bases. Heart - normal rate, regular rhythm, normal S1, S2, no murmurs, rubs, clicks or gallops Abdomen - soft, nontender, nondistended, no masses or organomegaly Extremities -no lower extremity edema.  Mild clubbing.    Chemistry      Component Value Date/Time   NA 144 06/27/2017 1418   NA 140 01/24/2017 0914   K 4.9 06/27/2017 1418   CL 107 06/27/2017 1418   CO2 30 06/27/2017 1418   BUN 13 06/27/2017 1418   BUN 9 01/24/2017 0914   CREATININE 1.20 06/27/2017 1418      Component Value Date/Time   CALCIUM 10.2 06/27/2017 1418   ALKPHOS 99 01/24/2017 0914   AST 188 (H) 06/27/2017 1418   ALT 164 (H) 06/27/2017 1418   BILITOT 0.8 06/27/2017 1418   BILITOT 0.7 01/24/2017 0914     Lab Results  Component Value Date   WBC 5.2 06/27/2017   HGB 16.6 06/27/2017   HCT 46.7 06/27/2017   MCV 94.9 06/27/2017   PLT 236 06/27/2017   Recent HIV test neg  ASSESSMENT AND PLAN: 1. Essential hypertension At goal. - losartan (COZAAR) 25 MG tablet; Take 1 tablet (25 mg total) by mouth daily.  Dispense: 90 tablet; Refill: 3 - amLODipine (NORVASC) 5 MG tablet; Take 1 tablet (5 mg total) by mouth daily.  Dispense: 90 tablet; Refill: 3  2. Mild persistent asthma without complication Start Symbicort.  Continue Ventolin as  needed.  Went over the difference between maintenance inhaler and rescue inhaler.  Advised to rinse mouth out after each use of Symbicort. - VENTOLIN  HFA 108 (90 Base) MCG/ACT inhaler; INHALE 2 PUFFS INTO THE LUNGS EVERY 6 HOURS AS NEEDED FOR WHEEZING OR SHORTNESS OF BREATH.  Dispense: 18 g; Refill: 11 - budesonide-formoterol (SYMBICORT) 80-4.5 MCG/ACT inhaler; Inhale 2 puffs into the lungs 2 (two) times daily.  Dispense: 1 Inhaler; Refill: 3  3. Tobacco dependence Patient advised to quit smoking. Discussed health risks associated with smoking including lung and other types of cancers, chronic lung diseases and CV risks.. Pt ready to give trail of quitting.  Discussed methods to help quit including quitting cold [place], use of NRT, Chantix and Bupropion.  He is wanting to try Chantix.  I went over possible side effects including bad dreams and mood swings.  Less than 5 minutes spent on counseling.  - varenicline (CHANTIX STARTING MONTH PAK) 0.5 MG X 11 & 1 MG X 42 tablet; One 0.5 mg tab PO daily for 3 days, then one 0.5 mg tab BID x 4 days, then increase to one 1 mg tablet twice daily.  Dispense: 53 tablet; Refill: 0  4. Weight loss, unintentional We will monitor this. We will get him up-to-date with age-appropriate cancer screenings including colonoscopy.  He is agreeable to lung cancer screening with low-dose CT scan - TSH; Future  5. Primary insomnia - traZODone (DESYREL) 50 MG tablet; Take 0.5-1 tablets (25-50 mg total) by mouth at bedtime as needed. for sleep  Dispense: 30 tablet; Refill: 5  6. Hep C w/ coma, chronic (HCC) Patient to keep appointment with infectious disease next week. Advised to avoid sharing toothbrushes and shavers.  Encourage him to inform his sexual partner so that that person can be tested also.  7. Prediabetes - Hemoglobin A1c; Future  8. Encounter for screening for lung cancer - CT CHEST LUNG CA SCREEN LOW DOSE W/O CM; Future  9. Colon cancer screening -  Ambulatory referral to Gastroenterology  Patient was given the opportunity to ask questions.  Patient verbalized understanding of the plan and was able to repeat key elements of the plan.   Orders Placed This Encounter  Procedures  . CT CHEST LUNG CA SCREEN LOW DOSE W/O CM  . TSH  . Hemoglobin A1c  . Ambulatory referral to Gastroenterology     Requested Prescriptions   Signed Prescriptions Disp Refills  . traZODone (DESYREL) 50 MG tablet 30 tablet 5    Sig: Take 0.5-1 tablets (25-50 mg total) by mouth at bedtime as needed. for sleep  . VENTOLIN HFA 108 (90 Base) MCG/ACT inhaler 18 g 11    Sig: INHALE 2 PUFFS INTO THE LUNGS EVERY 6 HOURS AS NEEDED FOR WHEEZING OR SHORTNESS OF BREATH.  Marland Kitchen losartan (COZAAR) 25 MG tablet 90 tablet 3    Sig: Take 1 tablet (25 mg total) by mouth daily.  Marland Kitchen amLODipine (NORVASC) 5 MG tablet 90 tablet 3    Sig: Take 1 tablet (5 mg total) by mouth daily.  . varenicline (CHANTIX STARTING MONTH PAK) 0.5 MG X 11 & 1 MG X 42 tablet 53 tablet 0    Sig: One 0.5 mg tab PO daily for 3 days, then one 0.5 mg tab BID x 4 days, then increase to one 1 mg tablet twice daily.  . budesonide-formoterol (SYMBICORT) 80-4.5 MCG/ACT inhaler 1 Inhaler 3    Sig: Inhale 2 puffs into the lungs 2 (two) times daily.    Return in about 7 weeks (around 10/12/2017).  Karle Plumber, MD, FACP

## 2017-08-25 ENCOUNTER — Encounter: Payer: Self-pay | Admitting: Gastroenterology

## 2017-08-28 ENCOUNTER — Ambulatory Visit: Payer: Medicare HMO | Attending: Internal Medicine

## 2017-08-28 ENCOUNTER — Ambulatory Visit (INDEPENDENT_AMBULATORY_CARE_PROVIDER_SITE_OTHER): Payer: Medicare HMO | Admitting: Infectious Diseases

## 2017-08-28 ENCOUNTER — Encounter: Payer: Self-pay | Admitting: Infectious Diseases

## 2017-08-28 VITALS — BP 123/81 | HR 68 | Temp 97.8°F | Wt 118.0 lb

## 2017-08-28 DIAGNOSIS — B182 Chronic viral hepatitis C: Secondary | ICD-10-CM | POA: Diagnosis not present

## 2017-08-28 DIAGNOSIS — Z23 Encounter for immunization: Secondary | ICD-10-CM

## 2017-08-28 DIAGNOSIS — R634 Abnormal weight loss: Secondary | ICD-10-CM | POA: Diagnosis not present

## 2017-08-28 DIAGNOSIS — R7303 Prediabetes: Secondary | ICD-10-CM | POA: Insufficient documentation

## 2017-08-28 DIAGNOSIS — I10 Essential (primary) hypertension: Secondary | ICD-10-CM | POA: Diagnosis not present

## 2017-08-28 NOTE — Progress Notes (Signed)
   Subjective:    Patient ID: Jason Wong, male    DOB: 27-Nov-1955, 62 y.o.   MRN: [id number]  HPI 62 yo M with hx of Hep C. Was seen in ID 4-9 and was found to have 1a VL 269,000.  Has been feeling well.  Has had u/s schedule Aug 8- he is not clear what type of u/s this is.   Review of Systems  Constitutional: Positive for appetite change and unexpected weight change.  Respiratory: Negative for cough and shortness of breath.   Cardiovascular: Negative for chest pain.  Gastrointestinal: Negative for constipation and diarrhea.  Genitourinary: Negative for difficulty urinating.  Neurological: Negative for headaches.  eats bid.      Objective:   Physical Exam  Constitutional: He appears well-developed. No distress.  HENT:  Mouth/Throat: Abnormal dentition. No oropharyngeal exudate.  Eyes: Pupils are equal, round, and reactive to light. EOM are normal.  Neck: Normal range of motion. Neck supple.  Cardiovascular: Normal rate, regular rhythm and normal heart sounds.  Pulmonary/Chest: Effort normal and breath sounds normal.  Abdominal: Soft. Bowel sounds are normal. He exhibits no distension. There is no tenderness.  Musculoskeletal: Normal range of motion. He exhibits no edema.  Lymphadenopathy:    He has no cervical adenopathy.  Psychiatric: He has a normal mood and affect.      Assessment & Plan:

## 2017-08-28 NOTE — Progress Notes (Signed)
Patient here for lab visit  

## 2017-08-28 NOTE — Assessment & Plan Note (Signed)
He's asx Will f/u with pcp

## 2017-08-28 NOTE — Assessment & Plan Note (Signed)
He has not had u/s or fibrosure.  Spoke with pharm, will get ultrasound.  Needs Hep A and B Pharm will see him after this is done.

## 2017-08-29 LAB — HEMOGLOBIN A1C
Est. average glucose Bld gHb Est-mCnc: 117 mg/dL
Hgb A1c MFr Bld: 5.7 % — ABNORMAL HIGH (ref 4.8–5.6)

## 2017-08-29 LAB — TSH: TSH: 0.858 u[IU]/mL (ref 0.450–4.500)

## 2017-09-05 ENCOUNTER — Telehealth: Payer: Self-pay | Admitting: *Deleted

## 2017-09-05 NOTE — Telephone Encounter (Signed)
Prior auth approved for CPT: [id number]. Case ID number is [id number].

## 2017-09-06 ENCOUNTER — Ambulatory Visit (HOSPITAL_COMMUNITY): Payer: Medicare HMO | Attending: Internal Medicine

## 2017-09-11 ENCOUNTER — Ambulatory Visit
Admission: RE | Admit: 2017-09-11 | Discharge: 2017-09-11 | Disposition: A | Discharge status: Home / Self Care | Payer: Medicare HMO | Source: Ambulatory Visit | Attending: Infectious Diseases | Admitting: Infectious Diseases

## 2017-09-11 DIAGNOSIS — B182 Chronic viral hepatitis C: Secondary | ICD-10-CM

## 2017-09-11 DIAGNOSIS — B1920 Unspecified viral hepatitis C without hepatic coma: Secondary | ICD-10-CM | POA: Diagnosis not present

## 2017-09-12 ENCOUNTER — Other Ambulatory Visit: Payer: Self-pay | Admitting: Infectious Diseases

## 2017-09-12 ENCOUNTER — Other Ambulatory Visit: Payer: Self-pay | Admitting: Pharmacist Clinician (PhC)/ Clinical Pharmacy Specialist

## 2017-09-12 MED ORDER — GLECAPREVIR-PIBRENTASVIR 100-40 MG PO TABS: 3.0000 | ORAL_TABLET | Freq: Every day | ORAL | 1 refills | Status: DC

## 2017-09-12 NOTE — Progress Notes (Signed)
Will use Mavyret x 8 wks for geno 1a, F2/3

## 2017-09-12 NOTE — Progress Notes (Signed)
Pt placed on mavyret.  My great appreciation to pharmacy!

## 2017-09-14 ENCOUNTER — Encounter: Payer: Self-pay | Admitting: Pharmacy Technician

## 2017-09-14 ENCOUNTER — Telehealth: Payer: Self-pay | Admitting: Pharmacist

## 2017-09-14 MED FILL — MAVYRET 100-40 MG TABS: 100-40 | 28 days supply | Qty: 84 | Fill #0

## 2017-09-14 NOTE — Telephone Encounter (Signed)
Patient is approved to receive Mavyret x 8 weeks for his Hep C infection. 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  Spoke to patient today and counseled him on how to take [place] including 3 pills once daily with food. Encouraged him not to miss any doses and to call me with any side effects. Counseled on possible side effects such as nausea, fatigue, and headaches. He will f/u with me on 7/17.

## 2017-09-14 NOTE — Telephone Encounter (Signed)
Thanks so much Cassie! 

## 2017-10-03 ENCOUNTER — Other Ambulatory Visit: Payer: Self-pay | Admitting: Internal Medicine

## 2017-10-03 ENCOUNTER — Ambulatory Visit (INDEPENDENT_AMBULATORY_CARE_PROVIDER_SITE_OTHER): Payer: Medicare HMO | Admitting: Pharmacist

## 2017-10-03 DIAGNOSIS — Z23 Encounter for immunization: Secondary | ICD-10-CM

## 2017-10-03 DIAGNOSIS — F17200 Nicotine dependence, unspecified, uncomplicated: Secondary | ICD-10-CM

## 2017-10-03 DIAGNOSIS — B182 Chronic viral hepatitis C: Secondary | ICD-10-CM | POA: Diagnosis not present

## 2017-10-03 LAB — COMPREHENSIVE METABOLIC PANEL
AG Ratio: 1.2 (calc) (ref 1.0–2.5)
ALT: 21 U/L (ref 9–46)
AST: 27 U/L (ref 10–35)
Albumin: 4.4 g/dL (ref 3.6–5.1)
Alkaline phosphatase (APISO): 89 U/L (ref 40–115)
BUN: 19 mg/dL (ref 7–25)
CO2: 26 mmol/L (ref 20–32)
Calcium: 10.3 mg/dL (ref 8.6–10.3)
Chloride: 103 mmol/L (ref 98–110)
Creat: 1.18 mg/dL (ref 0.70–1.25)
Globulin: 3.7 g/dL (ref 1.9–3.7)
Glucose, Bld: 58 mg/dL — ABNORMAL LOW (ref 65–99)
Potassium: 4.8 mmol/L (ref 3.5–5.3)
Sodium: 138 mmol/L (ref 135–146)
Total Bilirubin: 0.9 mg/dL (ref 0.2–1.2)
Total Protein: 8.1 g/dL (ref 6.1–8.1)

## 2017-10-03 MED ORDER — VARENICLINE TARTRATE 1 MG PO TABS: 1.0000 mg | ORAL_TABLET | Freq: Two times a day (BID) | ORAL | 0 refills | Status: DC

## 2017-10-03 MED FILL — VENTOLIN HFA 90 MCG INHALER: 108 (90 BAS | 18 days supply | Qty: 18 | Fill #1

## 2017-10-03 MED FILL — AMLODIPINE BESYLATE 5 MG TA: 5 | 90 days supply | Qty: 90 | Fill #2

## 2017-10-03 NOTE — Telephone Encounter (Signed)
Patient given prescription for Chantix starting month on 08/24/17. He now requires continuing month. Covered under pt's insurance. Sent in to [facility] pharmacy here.

## 2017-10-03 NOTE — Progress Notes (Signed)
HPI: Jason Wong is a 62 y.o. male who presents to the [place] clinic for Hepatitis C follow-up.  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  Patient Active Problem List   Diagnosis Date Noted  . Tobacco dependence 08/24/2017  . Weight loss, unintentional 08/24/2017  . Chronic viral hepatitis C ([place]) 06/27/2017  . Insomnia 06/27/2017  . Prediabetes 01/24/2017  . Asthma 06/10/2016  . Essential hypertension 06/10/2016  . Current every day smoker 06/10/2016    Patient's Medications  New Prescriptions   VARENICLINE (CHANTIX CONTINUING MONTH PAK) 1 MG TABLET    Take 1 tablet (1 mg total) by mouth 2 (two) times daily.  Previous Medications   AMLODIPINE (NORVASC) 5 MG TABLET    Take 1 tablet (5 mg total) by mouth daily.   BUDESONIDE-FORMOTEROL (SYMBICORT) 80-4.5 MCG/ACT INHALER    Inhale 2 puffs into the lungs 2 (two) times daily.   FEEDING SUPPLEMENT, ENSURE COMPLETE, (ENSURE COMPLETE) LIQD    Take 237 mLs 2 (two) times daily between meals as needed by mouth.   GLECAPREVIR-PIBRENTASVIR (MAVYRET) 100-40 MG TABS    Take 3 tablets by mouth daily with supper.   LOSARTAN (COZAAR) 25 MG TABLET    Take 1 tablet (25 mg total) by mouth daily.   TRAZODONE (DESYREL) 50 MG TABLET    Take 0.5-1 tablets (25-50 mg total) by mouth at bedtime as needed. for sleep   VENTOLIN HFA 108 (90 BASE) MCG/ACT INHALER    INHALE 2 PUFFS INTO THE LUNGS EVERY 6 HOURS AS NEEDED FOR WHEEZING OR SHORTNESS OF BREATH.   VITAMIN E 1000 UNIT CAPSULE    Take 1,000 Units by mouth daily.  Modified Medications   No medications on file  Discontinued Medications   No medications on file    Allergies: No Known Allergies  Past Medical History: Past Medical History:  Diagnosis Date  . Asthma   . Hepatitis C antibody positive in blood   . HTN (hypertension)     Social History: Social History   Socioeconomic History  . Marital status: Single    Spouse name: Not on file  . Number of  children: Not on file  . Years of education: Not on file  . Highest education level: Not on file  Occupational History  . Not on file  Social Needs  . Financial resource strain: Not on file  . Food insecurity:    Worry: Not on file    Inability: Not on file  . Transportation needs:    Medical: Not on file    Non-medical: Not on file  Tobacco Use  . Smoking status: Current Every Day Smoker    Packs/day: 0.50    Years: 45.00    Pack years: 22.50    Types: Cigarettes  . Smokeless tobacco: Never Used  Substance and Sexual Activity  . Alcohol use: Yes    Alcohol/week: 8.4 oz    Types: 14 Cans of beer per week  . Drug use: Not Currently    Types: "Crack" cocaine  . Sexual activity: Yes    Partners: Female  Lifestyle  . Physical activity:    Days per week: Not on file    Minutes per session: Not on file  . Stress: Not on file  Relationships  . Social connections:    Talks on phone: Not on file    Gets together: Not on file    Attends religious service: Not on file  Active member of club or organization: Not on file    Attends meetings of clubs or organizations: Not on file    Relationship status: Not on file  Other Topics Concern  . Not on file  Social History Narrative  . Not on file    Labs: Hepatitis C Lab Results  Component Value Date   HCVGENOTYPE 1a 06/27/2017   HCVRNAPCRQN 269,000 (H) 06/27/2017   Hepatitis B Lab Results  Component Value Date   HEPBSAB NON-REACTIVE 06/27/2017   HEPBSAG NON-REACTIVE 06/27/2017   Hepatitis A Lab Results  Component Value Date   HAV NON-REACTIVE 06/27/2017   HIV Lab Results  Component Value Date   HIV NON-REACTIVE 06/27/2017   Lab Results  Component Value Date   CREATININE 1.20 06/27/2017   CREATININE 1.00 01/24/2017   CREATININE 0.94 06/13/2016   Lab Results  Component Value Date   AST 188 (H) 06/27/2017   AST 148 (H) 01/24/2017   AST 158 (H) 06/13/2016   ALT 164 (H) 06/27/2017   ALT 147 (H) 01/24/2017     ALT 167 (H) 06/13/2016   INR 1.1 06/27/2017    Fibrosis Score: F2/F3 as assessed by [facility]   Assessment: Jason Wong is here today to follow-up for his Hep C infection.  He started 8 weeks of Mavyret on 6/28.  He has missed no doses since starting and takes it each morning around the same time with breakfast. He is experiencing some fatigue and appetite loss since starting the medication.  I explained that fatigue is quite common and would likely persist through treatment but that the appetite loss and weight loss is not usually associated with Mavyret.  He has lost 2 lbs (118 lbs >> 116 lbs) since he was last seen here by Dr. Johnnye Sima in June. I told him to keep an eye on his weight and to let me know if it doesn't get any better. He used to have a prescription for Ensure so it seems like this is not a new issue for him. No n/v/d or headaches. No issues getting the medication from [facility]. Emphasized the importance of staying adherent and not missing any doses throughout his 2 months.  He is due for his 2nd Hepatitis B vaccine.  I will check a Hep C viral load and a CMET today. He will follow-up with me again when he is done with treatment.   Plan: - Continue Mavyret x 8 weeks - Hepatitis B vaccine #2 - Hep C VL and CMET today - F/u with me again 9/10 at 915am  Cassie L. Kuppelweiser, PharmD, [place] [place] [facility] 10/03/2017, 3:46 PM

## 2017-10-04 ENCOUNTER — Ambulatory Visit: Payer: Medicare HMO

## 2017-10-05 LAB — HEPATITIS C RNA QUANTITATIVE
HCV Quantitative Log: <1.18 {Log_IU}/mL — AB
HCV RNA, PCR, QN: <15 [IU]/mL — AB

## 2017-10-09 MED FILL — MAVYRET 100-40 MG TABS: 100-40 | 28 days supply | Qty: 84 | Fill #1

## 2017-10-11 ENCOUNTER — Other Ambulatory Visit: Payer: Self-pay

## 2017-10-11 ENCOUNTER — Ambulatory Visit (AMBULATORY_SURGERY_CENTER): Payer: Self-pay

## 2017-10-11 VITALS — Ht 64.0 in | Wt 120.6 lb

## 2017-10-11 DIAGNOSIS — Z1211 Encounter for screening for malignant neoplasm of colon: Secondary | ICD-10-CM

## 2017-10-11 MED ORDER — NA SULFATE-K SULFATE-MG SULF 17.5-3.13-1.6 GM/177ML PO SOLN: 1.0000 | Freq: Once | ORAL | 0 refills | Status: AC

## 2017-10-11 NOTE — Progress Notes (Signed)
No egg or soy allergy known to patient  Pt has not has never had sedation No diet pills per patient No home 02 use per patient  No blood thinners per patient  Pt denies issues with constipation  No A fib or A flutter  EMMI video sent to pt's e mail , pt does not have email.

## 2017-10-12 ENCOUNTER — Encounter: Payer: Self-pay | Admitting: Gastroenterology

## 2017-10-16 ENCOUNTER — Ambulatory Visit: Payer: Medicare HMO | Admitting: Internal Medicine

## 2017-10-23 MED FILL — SUPREP BOWEL PREP KIT: 17.5-3.13-1 | 1 days supply | Qty: 354 | Fill #0

## 2017-10-26 ENCOUNTER — Ambulatory Visit (AMBULATORY_SURGERY_CENTER): Payer: Medicare HMO | Admitting: Gastroenterology

## 2017-10-26 ENCOUNTER — Encounter: Payer: Self-pay | Admitting: Gastroenterology

## 2017-10-26 VITALS — BP 103/63 | HR 68 | Temp 96.0°F | Resp 15 | Ht 64.0 in | Wt 118.0 lb

## 2017-10-26 DIAGNOSIS — K635 Polyp of colon: Secondary | ICD-10-CM | POA: Diagnosis not present

## 2017-10-26 DIAGNOSIS — D120 Benign neoplasm of cecum: Secondary | ICD-10-CM

## 2017-10-26 DIAGNOSIS — D122 Benign neoplasm of ascending colon: Secondary | ICD-10-CM | POA: Diagnosis not present

## 2017-10-26 DIAGNOSIS — D125 Benign neoplasm of sigmoid colon: Secondary | ICD-10-CM

## 2017-10-26 DIAGNOSIS — I10 Essential (primary) hypertension: Secondary | ICD-10-CM | POA: Diagnosis not present

## 2017-10-26 DIAGNOSIS — Z1211 Encounter for screening for malignant neoplasm of colon: Secondary | ICD-10-CM | POA: Diagnosis not present

## 2017-10-26 DIAGNOSIS — J45909 Unspecified asthma, uncomplicated: Secondary | ICD-10-CM | POA: Diagnosis not present

## 2017-10-26 MED ORDER — SODIUM CHLORIDE 0.9 % IV SOLN: 500.0000 mL | Freq: Once | INTRAVENOUS | Status: DC

## 2017-10-26 NOTE — Progress Notes (Signed)
Called to room to assist during endoscopic procedure.  Patient ID and intended procedure confirmed with present staff. Received instructions for my participation in the procedure from the performing physician.  

## 2017-10-26 NOTE — Op Note (Signed)
[facility] Patient Name: Jason Wong Procedure Date: 10/26/2017 10:09 AM MRN: [id number] Endoscopist: Mauri Pole , MD Age: 62 Referring MD:  Date of Birth: 1955/12/28 Gender: Male Account #: [id number] Procedure:                Colonoscopy Indications:              Screening for colorectal malignant neoplasm Medicines:                Monitored Anesthesia Care Procedure:                Pre-Anesthesia Assessment:                           - Prior to the procedure, a History and Physical                            was performed, and patient medications and                            allergies were reviewed. The patient's tolerance of                            previous anesthesia was also reviewed. The risks                            and benefits of the procedure and the sedation                            options and risks were discussed with the patient.                            All questions were answered, and informed consent                            was obtained. Prior Anticoagulants: The patient has                            taken no previous anticoagulant or antiplatelet                            agents. ASA Grade Assessment: II - A patient with                            mild systemic disease. After reviewing the risks                            and benefits, the patient was deemed in                            satisfactory condition to undergo the procedure.                           After obtaining informed consent, the colonoscope  was passed under direct vision. Throughout the                            procedure, the patient's blood pressure, pulse, and                            oxygen saturations were monitored continuously. The                            Model PCF-[id number]) scope was introduced                            through the anus and advanced to the the cecum,                            identified by  appendiceal orifice and ileocecal                            valve. The colonoscopy was performed without                            difficulty. The patient tolerated the procedure                            well. The quality of the bowel preparation was                            adequate. The ileocecal valve, appendiceal orifice,                            and rectum were photographed. Scope In: 10:17:08 AM Scope Out: 10:33:35 AM Scope Withdrawal Time: 0 hours 12 minutes 18 seconds  Total Procedure Duration: 0 hours 16 minutes 27 seconds  Findings:                 The perianal and digital rectal examinations were                            normal.                           A 2 mm polyp was found in the ileocecal valve. The                            polyp was sessile. The polyp was removed with a                            cold biopsy forceps. Resection and retrieval were                            complete.                           A 5 mm polyp was found in the ascending colon. The  polyp was sessile. The polyp was removed with a                            cold snare. Resection and retrieval were complete.                           A 20 mm polyp was found in the sigmoid colon. The                            polyp was multi-lobulated and pedunculated. The                            polyp was removed with a hot snare. Resection and                            retrieval were complete.                           A few small-mouthed diverticula were found in the                            sigmoid colon and ascending colon.                           Non-bleeding internal hemorrhoids were found during                            retroflexion. The hemorrhoids were medium-sized. Complications:            No immediate complications. Estimated Blood Loss:     Estimated blood loss was minimal. Impression:               - One 2 mm polyp at the ileocecal valve, removed                             with a cold biopsy forceps. Resected and retrieved.                           - One 5 mm polyp in the ascending colon, removed                            with a cold snare. Resected and retrieved.                           - One 20 mm polyp in the sigmoid colon, removed                            with a hot snare. Resected and retrieved.                           - Diverticulosis in the sigmoid colon and in the                            ascending  colon.                           - Non-bleeding internal hemorrhoids. Recommendation:           - Patient has a contact number available for                            emergencies. The signs and symptoms of potential                            delayed complications were discussed with the                            patient. Return to normal activities tomorrow.                            Written discharge instructions were provided to the                            patient.                           - Resume previous diet.                           - Continue present medications.                           - Await pathology results.                           - Repeat colonoscopy in 3 years for surveillance                            based on pathology results. Mauri Pole, MD 10/26/2017 10:38:22 AM This report has been signed electronically.

## 2017-10-26 NOTE — Patient Instructions (Signed)
**   Handouts given on polyps, diverticulosis, and hemorrhoids **   YOU HAD AN ENDOSCOPIC PROCEDURE TODAY AT THE [facility]:   Refer to the procedure report that was given to you for any specific questions about what was found during the examination.  If the procedure report does not answer your questions, please call your gastroenterologist to clarify.  If you requested that your care partner not be given the details of your procedure findings, then the procedure report has been included in a sealed envelope for you to review at your convenience later.  YOU SHOULD EXPECT: Some feelings of bloating in the abdomen. Passage of more gas than usual.  Walking can help get rid of the air that was put into your GI tract during the procedure and reduce the bloating. If you had a lower endoscopy (such as a colonoscopy or flexible sigmoidoscopy) you may notice spotting of blood in your stool or on the toilet paper. If you underwent a bowel prep for your procedure, you may not have a normal bowel movement for a few days.  Please Note:  You might notice some irritation and congestion in your nose or some drainage.  This is from the oxygen used during your procedure.  There is no need for concern and it should clear up in a day or so.  SYMPTOMS TO REPORT IMMEDIATELY:   Following lower endoscopy (colonoscopy or flexible sigmoidoscopy):  Excessive amounts of blood in the stool  Significant tenderness or worsening of abdominal pains  Swelling of the abdomen that is new, acute  Fever of 100F or higher  For urgent or emergent issues, a gastroenterologist can be reached at any hour by calling [phone].   DIET:  We do recommend a small meal at first, but then you may proceed to your regular diet.  Drink plenty of fluids but you should avoid alcoholic beverages for 24 hours.  ACTIVITY:  You should plan to take it easy for the rest of today and you should NOT DRIVE or use heavy machinery until  tomorrow (because of the sedation medicines used during the test).    FOLLOW UP: Our staff will call the number listed on your records the next business day following your procedure to check on you and address any questions or concerns that you may have regarding the information given to you following your procedure. If we do not reach you, we will leave a message.  However, if you are feeling well and you are not experiencing any problems, there is no need to return our call.  We will assume that you have returned to your regular daily activities without incident.  If any biopsies were taken you will be contacted by phone or by letter within the next 1-3 weeks.  Please call us at [phone] if you have not heard about the biopsies in 3 weeks.    SIGNATURES/CONFIDENTIALITY: You and/or your care partner have signed paperwork which will be entered into your electronic medical record.  These signatures attest to the fact that that the information above on your After Visit Summary has been reviewed and is understood.  Full responsibility of the confidentiality of this discharge information lies with you and/or your care-partner. 

## 2017-10-26 NOTE — Progress Notes (Signed)
Report given to PACU, vss 

## 2017-10-26 NOTE — Progress Notes (Signed)
Pt's states no medical or surgical changes since previsit or office visit. 

## 2017-10-27 ENCOUNTER — Telehealth: Payer: Self-pay

## 2017-10-27 NOTE — Telephone Encounter (Signed)
  Follow up Call-  Call back number 10/26/2017  Post procedure Call Back phone  # [phone]  Permission to leave phone message Yes  Some recent data might be hidden     Patient questions:  Do you have a fever, pain , or abdominal swelling? No. Pain Score  0 *  Have you tolerated food without any problems? Yes.    Have you been able to return to your normal activities? Yes.    Do you have any questions about your discharge instructions: Diet   No. Medications  No. Follow up visit  No.  Do you have questions or concerns about your Care? No.  Actions: * If pain score is 4 or above: No action needed, pain <4.

## 2017-10-31 ENCOUNTER — Ambulatory Visit: Payer: Medicare HMO | Admitting: Infectious Diseases

## 2017-11-02 ENCOUNTER — Encounter: Payer: Self-pay | Admitting: Gastroenterology

## 2017-11-10 ENCOUNTER — Ambulatory Visit: Payer: Medicare HMO | Admitting: Internal Medicine

## 2017-11-28 ENCOUNTER — Ambulatory Visit (INDEPENDENT_AMBULATORY_CARE_PROVIDER_SITE_OTHER): Payer: Medicare HMO | Admitting: Pharmacist

## 2017-11-28 DIAGNOSIS — B182 Chronic viral hepatitis C: Secondary | ICD-10-CM | POA: Diagnosis not present

## 2017-11-28 NOTE — Progress Notes (Signed)
HPI: Jason Wong is a 62 y.o. male presenting for EOT HCV follow up. 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  Patient Active Problem List   Diagnosis Date Noted  . Tobacco dependence 08/24/2017  . Weight loss, unintentional 08/24/2017  . Chronic viral hepatitis C ([place]) 06/27/2017  . Insomnia 06/27/2017  . Prediabetes 01/24/2017  . Asthma 06/10/2016  . Essential hypertension 06/10/2016  . Current every day smoker 06/10/2016    Patient's Medications  New Prescriptions   No medications on file  Previous Medications   AMLODIPINE (NORVASC) 5 MG TABLET    Take 1 tablet (5 mg total) by mouth daily.   BUDESONIDE-FORMOTEROL (SYMBICORT) 80-4.5 MCG/ACT INHALER    Inhale 2 puffs into the lungs 2 (two) times daily.   GLECAPREVIR-PIBRENTASVIR (MAVYRET) 100-40 MG TABS    Take 3 tablets by mouth daily with supper.   LOSARTAN (COZAAR) 25 MG TABLET    Take 1 tablet (25 mg total) by mouth daily.   TRAZODONE (DESYREL) 50 MG TABLET    Take 0.5-1 tablets (25-50 mg total) by mouth at bedtime as needed. for sleep   VARENICLINE (CHANTIX CONTINUING MONTH PAK) 1 MG TABLET    Take 1 tablet (1 mg total) by mouth 2 (two) times daily.   VENTOLIN HFA 108 (90 BASE) MCG/ACT INHALER    INHALE 2 PUFFS INTO THE LUNGS EVERY 6 HOURS AS NEEDED FOR WHEEZING OR SHORTNESS OF BREATH.   VITAMIN E 1000 UNIT CAPSULE    Take 1,000 Units by mouth daily.  Modified Medications   No medications on file  Discontinued Medications   No medications on file   Allergies: No Known Allergies  Past Medical History: Past Medical History:  Diagnosis Date  . Asthma   . Hepatitis C antibody positive in blood   . HTN (hypertension)    Social History: Social History   Socioeconomic History  . Marital status: Single    Spouse name: Not on file  . Number of children: Not on file  . Years of education: Not on file  . Highest education level: Not on file  Occupational History  . Not on file  Social  Needs  . Financial resource strain: Not on file  . Food insecurity:    Worry: Not on file    Inability: Not on file  . Transportation needs:    Medical: Not on file    Non-medical: Not on file  Tobacco Use  . Smoking status: Current Every Day Smoker    Packs/day: 0.50    Years: 45.00    Pack years: 22.50    Types: Cigarettes  . Smokeless tobacco: Never Used  Substance and Sexual Activity  . Alcohol use: Yes    Alcohol/week: 14.0 standard drinks    Types: 14 Cans of beer per week  . Drug use: Not Currently    Types: "Crack" cocaine  . Sexual activity: Yes    Partners: Female  Lifestyle  . Physical activity:    Days per week: Not on file    Minutes per session: Not on file  . Stress: Not on file  Relationships  . Social connections:    Talks on phone: Not on file    Gets together: Not on file    Attends religious service: Not on file    Active member of club or organization: Not on file    Attends meetings of clubs or organizations: Not on file    Relationship  status: Not on file  Other Topics Concern  . Not on file  Social History Narrative  . Not on file   Labs: Hepatitis C Lab Results  Component Value Date   HCVGENOTYPE 1a 06/27/2017   HCVRNAPCRQN <15 DETECTED (A) 10/03/2017   HCVRNAPCRQN 269,000 (H) 06/27/2017   Hepatitis B Lab Results  Component Value Date   HEPBSAB NON-REACTIVE 06/27/2017   HEPBSAG NON-REACTIVE 06/27/2017   Hepatitis A Lab Results  Component Value Date   HAV NON-REACTIVE 06/27/2017   HIV Lab Results  Component Value Date   HIV NON-REACTIVE 06/27/2017   Lab Results  Component Value Date   CREATININE 1.18 10/03/2017   CREATININE 1.20 06/27/2017   CREATININE 1.00 01/24/2017   CREATININE 0.94 06/13/2016   Lab Results  Component Value Date   AST 27 10/03/2017   AST 188 (H) 06/27/2017   AST 148 (H) 01/24/2017   ALT 21 10/03/2017   ALT 164 (H) 06/27/2017   ALT 147 (H) 01/24/2017   INR 1.1 06/27/2017   Assessment: Jason Wong  is here today for his EOT HCV visit. On initial questioning, he reported his last dose was ~1 week ago, but then later said ~2-3 weeks ago and continued to flip back and forth between 1 and 3 weeks. It was eventually revealed that he believes he only received one box of Mavyret. However, pharmacy records indicate the second box was dispensed and UPS delivered on 10/11/17. He should have finished around 8/23 which is ~3 weeks ago, as reported. He confirmed that he was taking all three tablets once daily and missed no doses, so if he did finish ~3 weeks ago, he would have had to have received the second box. Therefore it is unclear whether or not Jason Wong actually took the full 8 weeks or only 4 weeks.  Jason Wong reports still having issues with poor appetite and weight loss that have not improved since he stopped taking Mavyret. He also reports he is having issues with urinary retention/hesitancy, likely related to BPH. We explained it is unlikely either of these is related to Jason Wong and  encouraged Jason Wong to address these issues with his PCP at his upcoming appointment on 10/7. Jason Wong will also need to complete his hepatitis A and B vaccination series in December.   Plan: HCV RNA F/u results - If negative, f/u again in 3 months - If positive, f/u for re-treatment Complete hepA/B vaccine series in December  Erin N. Gerarda Fraction, PharmD PGY2 Infectious Diseases Pharmacy Resident Phone: [phone] 11/28/2017, 11:26 AM

## 2017-11-30 LAB — HEPATITIS C RNA QUANTITATIVE
HCV Quantitative Log: <1.18 {Log_IU}/mL
HCV RNA, PCR, QN: <15 [IU]/mL

## 2017-12-04 ENCOUNTER — Telehealth: Payer: Self-pay | Admitting: Pharmacist

## 2017-12-04 NOTE — Telephone Encounter (Signed)
Called patient to schedule his 3 months follow-up appointment for Hep C. Had to leave a voicemail.

## 2017-12-21 ENCOUNTER — Ambulatory Visit: Payer: Medicare HMO | Admitting: Internal Medicine

## 2017-12-25 ENCOUNTER — Encounter: Payer: Self-pay | Admitting: Internal Medicine

## 2017-12-25 ENCOUNTER — Ambulatory Visit: Payer: Medicare HMO | Attending: Internal Medicine | Admitting: Internal Medicine

## 2017-12-25 ENCOUNTER — Telehealth: Payer: Self-pay | Admitting: Pharmacist

## 2017-12-25 VITALS — BP 122/82 | HR 64 | Temp 97.8°F | Ht 64.0 in | Wt 120.6 lb

## 2017-12-25 DIAGNOSIS — G4700 Insomnia, unspecified: Secondary | ICD-10-CM | POA: Diagnosis not present

## 2017-12-25 DIAGNOSIS — J4530 Mild persistent asthma, uncomplicated: Secondary | ICD-10-CM | POA: Insufficient documentation

## 2017-12-25 DIAGNOSIS — Z760 Encounter for issue of repeat prescription: Secondary | ICD-10-CM | POA: Insufficient documentation

## 2017-12-25 DIAGNOSIS — F17210 Nicotine dependence, cigarettes, uncomplicated: Secondary | ICD-10-CM | POA: Diagnosis not present

## 2017-12-25 DIAGNOSIS — I10 Essential (primary) hypertension: Secondary | ICD-10-CM | POA: Insufficient documentation

## 2017-12-25 DIAGNOSIS — B182 Chronic viral hepatitis C: Secondary | ICD-10-CM | POA: Insufficient documentation

## 2017-12-25 DIAGNOSIS — Z23 Encounter for immunization: Secondary | ICD-10-CM | POA: Diagnosis not present

## 2017-12-25 DIAGNOSIS — F5101 Primary insomnia: Secondary | ICD-10-CM | POA: Insufficient documentation

## 2017-12-25 DIAGNOSIS — F17200 Nicotine dependence, unspecified, uncomplicated: Secondary | ICD-10-CM

## 2017-12-25 MED ORDER — VARENICLINE TARTRATE 1 MG PO TABS: 1.0000 mg | ORAL_TABLET | Freq: Two times a day (BID) | ORAL | 1 refills | Status: DC

## 2017-12-25 MED ORDER — BUDESONIDE-FORMOTEROL FUMARATE 80-4.5 MCG/ACT IN AERO: 2.0000 | INHALATION_SPRAY | Freq: Two times a day (BID) | RESPIRATORY_TRACT | 6 refills | Status: DC

## 2017-12-25 MED ORDER — AMLODIPINE BESYLATE 5 MG PO TABS: 2.5000 mg | ORAL_TABLET | Freq: Every day | ORAL | 3 refills | Status: DC

## 2017-12-25 MED ORDER — TRAZODONE HCL 50 MG PO TABS: 25.0000 mg | ORAL_TABLET | Freq: Every evening | ORAL | 5 refills | Status: DC | PRN

## 2017-12-25 MED ORDER — VENTOLIN HFA 108 (90 BASE) MCG/ACT IN AERS: INHALATION_SPRAY | RESPIRATORY_TRACT | 11 refills | Status: DC

## 2017-12-25 MED ORDER — VARENICLINE TARTRATE 0.5 MG X 11 & 1 MG X 42 PO MISC: ORAL | 0 refills | Status: DC

## 2017-12-25 NOTE — Progress Notes (Signed)
Patient is here to follow-up on his blood pressure and medication refill.   Patient stated when he bend over and rise up, patient stated he gets dizzy.

## 2017-12-25 NOTE — Telephone Encounter (Signed)
Was able to get in touch with patient and schedule him to come back and see me in December to recheck his Hep C viral load for his cure visit. Scheduled for 12/10 at 915am.

## 2017-12-25 NOTE — Progress Notes (Addendum)
Patient ID: Jason Wong, male    DOB: Dec 11, 1955  MRN: [id number]  CC: Follow-up and Medication Refill   Subjective: Jason Wong is a 62 y.o. male who presents for chronic disease management.  Last seen in June of this year. His concerns today include:  Pt with hx of HTN, tob, asthma and hep C  Tob dep:  Missed appt for low dose-CT for lung cancer screening.  He is agreeable to having it rescheduled. Reports he was able to cut back a lot while on Chantix.  Down to 1 cigarette Q 2 days.  Now smoking about 3 a day since being out of Chantix.  He would like to try it again.  HTN: He has been out of Cozaar for several months.  States that the pharmacy did not give it to him when he came for refills on his other medications in July.  He is compliant with Norvasc.  Reports feeling dizzy for several seconds when he bends over and stands up too quickly.  Denies any chest pains.  No lower extremity edema.    Hepatitis C: The clinical pharmacist at the [place] clinic tried to contact him last month to schedule his 45-month follow-up.  Voicemail reportedly left on his phone but patient does not recall getting it.  Asthma: Requests refill on Symbicort and albuterol inhalers.  Denies any chronic cough at this time.  Some shortness of breath especially in warm weather.  Insomnia: Requests refill on trazodone.  States that he has been out of it for several weeks.  Still has problems falling asleep and staying asleep.  Drinks several sodas during the day that contains caffeine   Patient Active Problem List   Diagnosis Date Noted  . Tobacco dependence 08/24/2017  . Weight loss, unintentional 08/24/2017  . Chronic viral hepatitis C ([place]) 06/27/2017  . Insomnia 06/27/2017  . Prediabetes 01/24/2017  . Asthma 06/10/2016  . Essential hypertension 06/10/2016  . Current every day smoker 06/10/2016     Current Outpatient Medications on File Prior to Visit  Medication Sig Dispense Refill  . vitamin E 1000  UNIT capsule Take 1,000 Units by mouth daily.    . Glecaprevir-Pibrentasvir (MAVYRET) 100-40 MG TABS Take 3 tablets by mouth daily with supper. (Patient not taking: Reported on 12/25/2017) 84 tablet 1   No current facility-administered medications on file prior to visit.     No Known Allergies  Social History   Socioeconomic History  . Marital status: Single    Spouse name: Not on file  . Number of children: Not on file  . Years of education: Not on file  . Highest education level: Not on file  Occupational History  . Not on file  Social Needs  . Financial resource strain: Not on file  . Food insecurity:    Worry: Not on file    Inability: Not on file  . Transportation needs:    Medical: Not on file    Non-medical: Not on file  Tobacco Use  . Smoking status: Current Every Day Smoker    Packs/day: 0.50    Years: 45.00    Pack years: 22.50    Types: Cigarettes  . Smokeless tobacco: Never Used  Substance and Sexual Activity  . Alcohol use: Yes    Alcohol/week: 14.0 standard drinks    Types: 14 Cans of beer per week  . Drug use: Not Currently    Types: "Crack" cocaine  . Sexual activity: Yes    Partners:  Female  Lifestyle  . Physical activity:    Days per week: Not on file    Minutes per session: Not on file  . Stress: Not on file  Relationships  . Social connections:    Talks on phone: Not on file    Gets together: Not on file    Attends religious service: Not on file    Active member of club or organization: Not on file    Attends meetings of clubs or organizations: Not on file    Relationship status: Not on file  . Intimate partner violence:    Fear of current or ex partner: Not on file    Emotionally abused: Not on file    Physically abused: Not on file    Forced sexual activity: Not on file  Other Topics Concern  . Not on file  Social History Narrative  . Not on file    Family History  Problem Relation Age of Onset  . Hypertension Mother   . Cancer  Sister   . Hypertension Sister   . Cancer Brother   . Colon cancer Neg Hx   . Colon polyps Neg Hx   . Esophageal cancer Neg Hx   . Rectal cancer Neg Hx   . Stomach cancer Neg Hx     Past Surgical History:  Procedure Laterality Date  . INCISE AND DRAIN ABCESS     dog bite    ROS: Review of Systems Negative except as above.  PHYSICAL EXAM: BP 122/82 (BP Location: Right Arm, Patient Position: Sitting, Cuff Size: Normal)   Pulse 64   Temp 97.8 F (36.6 C) (Oral)   Ht 5\' 4"  (1.626 m)   Wt 120 lb 9.6 oz (54.7 kg)   SpO2 100%   BMI 20.70 kg/m   Wt Readings from Last 3 Encounters:  12/25/17 120 lb 9.6 oz (54.7 kg)  10/26/17 118 lb (53.5 kg)  10/11/17 120 lb 9.6 oz (54.7 kg)  BP 110/72 siting, 96/80 standing  Physical Exam General appearance -old African-American male in NAD. Mental status - normal mood, behavior, speech, dress, motor activity Neck - supple, no significant adenopathy Chest - clear to auscultation, no wheezes, rales or rhonchi, symmetric air entry Heart - normal rate, regular rhythm, normal S1, S2, no murmurs, rubs, clicks or gallops Extremities - peripheral pulses normal, no pedal edema, no clubbing or cyanosis  ASSESSMENT AND PLAN: 1. Essential hypertension His blood pressure looks good.  I have recommended going slow with position changes.  Stop Cozaar since he has not filled this in several months and blood pressure is okay without it.  Decrease amlodipine to half a tablet daily - amLODipine (NORVASC) 5 MG tablet; Take 0.5 tablets (2.5 mg total) by mouth daily.  Dispense: 45 tablet; Refill: 3  2. Tobacco dependence Commended him on being able to cut back.  He did well on Chantix without significant side effects.  We will restart him on Chantix again.  Advised to put down the cigarettes after being on the Chantix for about 1 month. -Low-dose CT scan of the chest will be rescheduled  3. Mild persistent asthma without complication - budesonide-formoterol  (SYMBICORT) 80-4.5 MCG/ACT inhaler; Inhale 2 puffs into the lungs 2 (two) times daily.  Dispense: 1 Inhaler; Refill: 6 - VENTOLIN HFA 108 (90 Base) MCG/ACT inhaler; INHALE 2 PUFFS INTO THE LUNGS EVERY 6 HOURS AS NEEDED FOR WHEEZING OR SHORTNESS OF BREATH.  Dispense: 18 g; Refill: 11  4. Primary insomnia Discussed good  sleep hygiene.  Patient advised not to drink any caffeinated beverages within 5 hours of bedtime.  Advised to get in bed around about the same time every night and to turn off all lights and sounds including cell phone - traZODone (DESYREL) 50 MG tablet; Take 0.5-1 tablets (25-50 mg total) by mouth at bedtime as needed. for sleep  Dispense: 30 tablet; Refill: 5  5. Chronic hepatitis C without hepatic coma [facility]) Message sent to clinical pharmacist associated with [facility] clinic to try and get him scheduled for follow-up with him.  His last hepatitis C RNA was undetectable.  6. Need for immunization against influenza - Flu Vaccine QUAD 36+ mos IM  Patient was given the opportunity to ask questions.  Patient verbalized understanding of the plan and was able to repeat key elements of the plan.   Orders Placed This Encounter  Procedures  . Flu Vaccine QUAD 36+ mos IM     Requested Prescriptions   Signed Prescriptions Disp Refills  . budesonide-formoterol (SYMBICORT) 80-4.5 MCG/ACT inhaler 1 Inhaler 6    Sig: Inhale 2 puffs into the lungs 2 (two) times daily.  . VENTOLIN HFA 108 (90 Base) MCG/ACT inhaler 18 g 11    Sig: INHALE 2 PUFFS INTO THE LUNGS EVERY 6 HOURS AS NEEDED FOR WHEEZING OR SHORTNESS OF BREATH.  . varenicline (CHANTIX CONTINUING MONTH PAK) 1 MG tablet 56 tablet 1    Sig: Take 1 tablet (1 mg total) by mouth 2 (two) times daily.  . varenicline (CHANTIX STARTING MONTH PAK) 0.5 MG X 11 & 1 MG X 42 tablet 53 tablet 0    Sig: one 0.5 mg tab PO once daily for 3 days, then increase to one 0.5 mg tablet twice daily for 4 days, then increase to one 1 mg tablet twice daily.  .  traZODone (DESYREL) 50 MG tablet 30 tablet 5    Sig: Take 0.5-1 tablets (25-50 mg total) by mouth at bedtime as needed. for sleep  . amLODipine (NORVASC) 5 MG tablet 45 tablet 3    Sig: Take 0.5 tablets (2.5 mg total) by mouth daily.    Return in about 4 months (around 04/27/2018).  Karle Plumber, MD, FACP

## 2018-01-02 ENCOUNTER — Ambulatory Visit (HOSPITAL_COMMUNITY)
Admission: RE | Admit: 2018-01-02 | Discharge: 2018-01-02 | Disposition: A | Discharge status: Home / Self Care | Payer: Medicare HMO | Source: Ambulatory Visit | Attending: Internal Medicine | Admitting: Internal Medicine

## 2018-01-02 DIAGNOSIS — F17210 Nicotine dependence, cigarettes, uncomplicated: Secondary | ICD-10-CM | POA: Diagnosis not present

## 2018-01-02 DIAGNOSIS — Z122 Encounter for screening for malignant neoplasm of respiratory organs: Secondary | ICD-10-CM | POA: Insufficient documentation

## 2018-01-02 DIAGNOSIS — I700 Atherosclerosis of aorta: Secondary | ICD-10-CM | POA: Insufficient documentation

## 2018-01-02 DIAGNOSIS — J439 Emphysema, unspecified: Secondary | ICD-10-CM | POA: Insufficient documentation

## 2018-01-02 DIAGNOSIS — I2510 Atherosclerotic heart disease of native coronary artery without angina pectoris: Secondary | ICD-10-CM | POA: Diagnosis not present

## 2018-01-02 MED FILL — VENTOLIN HFA 90 MCG INHALER: 108 (90 BAS | 25 days supply | Qty: 18 | Fill #0

## 2018-01-02 MED FILL — SYMBICORT 80-4.5 MCG INH: 80-4.5 | 30 days supply | Qty: 10 | Fill #0

## 2018-01-02 MED FILL — traZODone HCL 50 MG TABS: 50 | 30 days supply | Qty: 30 | Fill #0

## 2018-01-02 MED FILL — AMLODIPINE BESYLATE 5 MG TA: 5 | 30 days supply | Qty: 15 | Fill #0

## 2018-01-05 ENCOUNTER — Telehealth: Payer: Self-pay

## 2018-01-05 NOTE — Telephone Encounter (Signed)
Contacted pt to go over CT results pt is aware of results and doesn't have any questions or concerns 

## 2018-01-25 MED FILL — AMLODIPINE BESYLATE 5 MG TA: 5 | 30 days supply | Qty: 15 | Fill #1

## 2018-01-25 MED FILL — CHANTIX STARTING MONTH BOX: 0.5 MG X 11 | 30 days supply | Qty: 53 | Fill #0

## 2018-02-27 ENCOUNTER — Ambulatory Visit: Payer: Medicare HMO

## 2018-02-27 MED FILL — AMLODIPINE BESYLATE 5 MG TA: 5 | 30 days supply | Qty: 15 | Fill #2

## 2018-02-28 ENCOUNTER — Ambulatory Visit (INDEPENDENT_AMBULATORY_CARE_PROVIDER_SITE_OTHER): Payer: Medicare HMO | Admitting: Pharmacist

## 2018-02-28 DIAGNOSIS — B182 Chronic viral hepatitis C: Secondary | ICD-10-CM

## 2018-02-28 NOTE — Progress Notes (Signed)
HPI: Jason Wong is a 62 y.o. male who presents to the [facility] for follow-up of their Hepatitis C infection.  Medication: Mavyret x 8 weeks  Start Date: 6/28  Hepatitis C Genotype: 1a  Fibrosis Score: F2/F3  Hepatitis C RNA: 269,000 om 06/27/17, <15 on 7/16 and 9/10  Patient Active Problem List   Diagnosis Date Noted  . Tobacco dependence 08/24/2017  . Weight loss, unintentional 08/24/2017  . Chronic viral hepatitis C ([place]) 06/27/2017  . Insomnia 06/27/2017  . Prediabetes 01/24/2017  . Asthma 06/10/2016  . Essential hypertension 06/10/2016  . Current every day smoker 06/10/2016    Patient's Medications  New Prescriptions   No medications on file  Previous Medications   AMLODIPINE (NORVASC) 5 MG TABLET    Take 0.5 tablets (2.5 mg total) by mouth daily.   BUDESONIDE-FORMOTEROL (SYMBICORT) 80-4.5 MCG/ACT INHALER    Inhale 2 puffs into the lungs 2 (two) times daily.   GLECAPREVIR-PIBRENTASVIR (MAVYRET) 100-40 MG TABS    Take 3 tablets by mouth daily with supper.   TRAZODONE (DESYREL) 50 MG TABLET    Take 0.5-1 tablets (25-50 mg total) by mouth at bedtime as needed. for sleep   VARENICLINE (CHANTIX CONTINUING MONTH PAK) 1 MG TABLET    Take 1 tablet (1 mg total) by mouth 2 (two) times daily.   VARENICLINE (CHANTIX STARTING MONTH PAK) 0.5 MG X 11 & 1 MG X 42 TABLET    one 0.5 mg tab PO once daily for 3 days, then increase to one 0.5 mg tablet twice daily for 4 days, then increase to one 1 mg tablet twice daily.   VENTOLIN HFA 108 (90 BASE) MCG/ACT INHALER    INHALE 2 PUFFS INTO THE LUNGS EVERY 6 HOURS AS NEEDED FOR WHEEZING OR SHORTNESS OF BREATH.   VITAMIN E 1000 UNIT CAPSULE    Take 1,000 Units by mouth daily.  Modified Medications   No medications on file  Discontinued Medications   No medications on file    Allergies: No Known Allergies  Past Medical History: Past Medical History:  Diagnosis Date  . Asthma   . Hepatitis C antibody positive in blood   .  HTN (hypertension)     Social History: Social History   Socioeconomic History  . Marital status: Single    Spouse name: Not on file  . Number of children: Not on file  . Years of education: Not on file  . Highest education level: Not on file  Occupational History  . Not on file  Social Needs  . Financial resource strain: Not on file  . Food insecurity:    Worry: Not on file    Inability: Not on file  . Transportation needs:    Medical: Not on file    Non-medical: Not on file  Tobacco Use  . Smoking status: Current Every Day Smoker    Packs/day: 0.50    Years: 45.00    Pack years: 22.50    Types: Cigarettes  . Smokeless tobacco: Never Used  Substance and Sexual Activity  . Alcohol use: Yes    Alcohol/week: 14.0 standard drinks    Types: 14 Cans of beer per week  . Drug use: Not Currently    Types: "Crack" cocaine  . Sexual activity: Yes    Partners: Female  Lifestyle  . Physical activity:    Days per week: Not on file    Minutes per session: Not on file  . Stress: Not on file  Relationships  . Social connections:    Talks on phone: Not on file    Gets together: Not on file    Attends religious service: Not on file    Active member of club or organization: Not on file    Attends meetings of clubs or organizations: Not on file    Relationship status: Not on file  Other Topics Concern  . Not on file  Social History Narrative  . Not on file    Labs: Hepatitis C Lab Results  Component Value Date   HCVGENOTYPE 1a 06/27/2017   HCVRNAPCRQN <15 NOT DETECTED 11/28/2017   HCVRNAPCRQN <15 DETECTED (A) 10/03/2017   HCVRNAPCRQN 269,000 (H) 06/27/2017   Hepatitis B Lab Results  Component Value Date   HEPBSAB NON-REACTIVE 06/27/2017   HEPBSAG NON-REACTIVE 06/27/2017   Hepatitis A Lab Results  Component Value Date   HAV NON-REACTIVE 06/27/2017   HIV Lab Results  Component Value Date   HIV NON-REACTIVE 06/27/2017   Lab Results  Component Value Date    CREATININE 1.18 10/03/2017   CREATININE 1.20 06/27/2017   CREATININE 1.00 01/24/2017   CREATININE 0.94 06/13/2016   Lab Results  Component Value Date   AST 27 10/03/2017   AST 188 (H) 06/27/2017   AST 148 (H) 01/24/2017   ALT 21 10/03/2017   ALT 164 (H) 06/27/2017   ALT 147 (H) 01/24/2017   INR 1.1 06/27/2017    Assessment: Jason Wong is here today to follow-up for his Hepatitis C cure visit.  He was supposed to take [place] for 8 weeks but the last time I saw him, he told me he only got one box of Mavyret and never took the 2nd box. Today, he tells me that he did take 2 months and never got a 3rd month (which he was never supposed to get a 3rd box anyway). Regardless, we will check a Hep C viral load today for cure.  I will call him with results.  I cautioned him that he could become infected again once cured and to not engage in any risky behavior.   Plan: - Hep C RNA today for cure - Call with results  Cassie L. Kuppelweiser, PharmD, BCIDP, AAHIVP, [facility] 02/28/2018, 4:17 PM

## 2018-03-02 LAB — HEPATITIS C RNA QUANTITATIVE
HCV Quantitative Log: <1.18 {Log_IU}/mL
HCV RNA, PCR, QN: <15 [IU]/mL

## 2018-03-05 ENCOUNTER — Telehealth: Payer: Self-pay | Admitting: Pharmacist

## 2018-03-05 NOTE — Telephone Encounter (Signed)
Patient is now cured of his Hepatitis C.

## 2018-04-27 ENCOUNTER — Ambulatory Visit: Payer: Medicare HMO | Attending: Internal Medicine | Admitting: Internal Medicine

## 2018-04-27 ENCOUNTER — Encounter: Payer: Self-pay | Admitting: Internal Medicine

## 2018-04-27 VITALS — BP 156/99 | HR 63 | Temp 97.5°F | Resp 16 | Ht 64.0 in | Wt 129.0 lb

## 2018-04-27 DIAGNOSIS — J432 Centrilobular emphysema: Secondary | ICD-10-CM | POA: Diagnosis not present

## 2018-04-27 DIAGNOSIS — I10 Essential (primary) hypertension: Secondary | ICD-10-CM | POA: Insufficient documentation

## 2018-04-27 DIAGNOSIS — J302 Other seasonal allergic rhinitis: Secondary | ICD-10-CM

## 2018-04-27 DIAGNOSIS — R32 Unspecified urinary incontinence: Secondary | ICD-10-CM | POA: Diagnosis not present

## 2018-04-27 DIAGNOSIS — Z8619 Personal history of other infectious and parasitic diseases: Secondary | ICD-10-CM | POA: Diagnosis not present

## 2018-04-27 DIAGNOSIS — R7303 Prediabetes: Secondary | ICD-10-CM | POA: Insufficient documentation

## 2018-04-27 DIAGNOSIS — F5101 Primary insomnia: Secondary | ICD-10-CM | POA: Insufficient documentation

## 2018-04-27 DIAGNOSIS — J4540 Moderate persistent asthma, uncomplicated: Secondary | ICD-10-CM | POA: Diagnosis not present

## 2018-04-27 DIAGNOSIS — F17200 Nicotine dependence, unspecified, uncomplicated: Secondary | ICD-10-CM

## 2018-04-27 DIAGNOSIS — Z8249 Family history of ischemic heart disease and other diseases of the circulatory system: Secondary | ICD-10-CM | POA: Insufficient documentation

## 2018-04-27 DIAGNOSIS — Z7901 Long term (current) use of anticoagulants: Secondary | ICD-10-CM | POA: Insufficient documentation

## 2018-04-27 DIAGNOSIS — F17210 Nicotine dependence, cigarettes, uncomplicated: Secondary | ICD-10-CM | POA: Insufficient documentation

## 2018-04-27 DIAGNOSIS — Z79899 Other long term (current) drug therapy: Secondary | ICD-10-CM | POA: Insufficient documentation

## 2018-04-27 MED ORDER — BUDESONIDE-FORMOTEROL FUMARATE 80-4.5 MCG/ACT IN AERO: 2.0000 | INHALATION_SPRAY | Freq: Two times a day (BID) | RESPIRATORY_TRACT | 12 refills | Status: DC

## 2018-04-27 MED ORDER — LORATADINE 10 MG PO TABS: 10.0000 mg | ORAL_TABLET | Freq: Every day | ORAL | 4 refills | Status: DC

## 2018-04-27 MED ORDER — MELATONIN 3 MG PO TABS: ORAL_TABLET | ORAL | 2 refills | Status: DC

## 2018-04-27 MED ORDER — AMLODIPINE BESYLATE 5 MG PO TABS: 2.5000 mg | ORAL_TABLET | Freq: Every day | ORAL | 6 refills | Status: DC

## 2018-04-27 NOTE — Patient Instructions (Signed)
Stop trazodone.  Start taking melatonin at bedtime instead. Avoid drinking caffeinated beverages including sodas that have caffeine in it at bedtime. Try to set your alarm to get up in the middle of the night to urinate.  You should use the Symbicort inhaler twice a day as prescribed. The Ventolin inhaler is to be used as needed. Continue to work on trying to quit smoking.

## 2018-04-27 NOTE — Progress Notes (Signed)
Patient ID: Jason Wong, male    DOB: May 19, 1955  MRN: [id number]  CC: Hypertension   Subjective: Jason Wong is a 63 y.o. male who presents for chronic disease management. His concerns today include:  Pt with hx of HTN, tob, asthma, insomnia and hep C  Hep C: He has seen the pharmacist associated with the infectious disease clinic since last visit.  Repeat hepatitis C quant was negative and patient has been declared cured.  He denies any street drug use. Drinks a 16 oz beer a few times a wk.   Tob dep:  Prescribed Chantix on last visit. He states he did fill rxn and took them but still not able to quit completely.  Smoking 1-3 cig/day.  Low-dose screening CT of the lungs was negative for any nodules.  It did show changes of emphysema and some plaque buildup in the coronary arteries.  I had recommended taking baby aspirin daily.  Last lipid profile was 05/2016 with normal total and LDL cholesterol.  Insomnia: Reports good results with trazodone but "the medication causes me to piss the bed at nights."  States that he sleeps so well that he does not feel when he has to get up and urinate.  No dysuria or hematuria.  He admits that he drinks sodas at night including Coke and Sprite  Asthma: "I still be wheezing and coughing at nights."  Only has Ventolin inhaler which he has to use every night.  Out of Symbicort for a while he does not know that he has to call the pharmacy or come to the pharmacy and specifically request a RF.  He thought they would automatically give it to him whenever he comes to get a refill on the Ventolin inhaler.  Also complains of sneezing and runny nose.  No lacrimation or itchy throat.  Reports being on a medication in the past for hayfever.  HTN: out of Norvasc x 1 mth.  States that he just did not come to the pharmacy to pick up on time but plans to pick up today.  Patient Active Problem List   Diagnosis Date Noted  . Tobacco dependence 08/24/2017  . Weight  loss, unintentional 08/24/2017  . Chronic viral hepatitis C ([place]) 06/27/2017  . Insomnia 06/27/2017  . Prediabetes 01/24/2017  . Asthma 06/10/2016  . Essential hypertension 06/10/2016  . Current every day smoker 06/10/2016     Current Outpatient Medications on File Prior to Visit  Medication Sig Dispense Refill  . amLODipine (NORVASC) 5 MG tablet Take 0.5 tablets (2.5 mg total) by mouth daily. 45 tablet 3  . budesonide-formoterol (SYMBICORT) 80-4.5 MCG/ACT inhaler Inhale 2 puffs into the lungs 2 (two) times daily. 1 Inhaler 6  . traZODone (DESYREL) 50 MG tablet Take 0.5-1 tablets (25-50 mg total) by mouth at bedtime as needed. for sleep 30 tablet 5  . varenicline (CHANTIX CONTINUING MONTH PAK) 1 MG tablet Take 1 tablet (1 mg total) by mouth 2 (two) times daily. 56 tablet 1  . varenicline (CHANTIX STARTING MONTH PAK) 0.5 MG X 11 & 1 MG X 42 tablet one 0.5 mg tab PO once daily for 3 days, then increase to one 0.5 mg tablet twice daily for 4 days, then increase to one 1 mg tablet twice daily. 53 tablet 0  . VENTOLIN HFA 108 (90 Base) MCG/ACT inhaler INHALE 2 PUFFS INTO THE LUNGS EVERY 6 HOURS AS NEEDED FOR WHEEZING OR SHORTNESS OF BREATH. 18 g 11  . vitamin E 1000  UNIT capsule Take 1,000 Units by mouth daily.     No current facility-administered medications on file prior to visit.     No Known Allergies  Social History   Socioeconomic History  . Marital status: Single    Spouse name: Not on file  . Number of children: Not on file  . Years of education: Not on file  . Highest education level: Not on file  Occupational History  . Not on file  Social Needs  . Financial resource strain: Not on file  . Food insecurity:    Worry: Not on file    Inability: Not on file  . Transportation needs:    Medical: Not on file    Non-medical: Not on file  Tobacco Use  . Smoking status: Current Every Day Smoker    Packs/day: 0.50    Years: 45.00    Pack years: 22.50    Types: Cigarettes  .  Smokeless tobacco: Never Used  Substance and Sexual Activity  . Alcohol use: Yes    Alcohol/week: 14.0 standard drinks    Types: 14 Cans of beer per week  . Drug use: Not Currently    Types: "Crack" cocaine  . Sexual activity: Yes    Partners: Female  Lifestyle  . Physical activity:    Days per week: Not on file    Minutes per session: Not on file  . Stress: Not on file  Relationships  . Social connections:    Talks on phone: Not on file    Gets together: Not on file    Attends religious service: Not on file    Active member of club or organization: Not on file    Attends meetings of clubs or organizations: Not on file    Relationship status: Not on file  . Intimate partner violence:    Fear of current or ex partner: Not on file    Emotionally abused: Not on file    Physically abused: Not on file    Forced sexual activity: Not on file  Other Topics Concern  . Not on file  Social History Narrative  . Not on file    Family History  Problem Relation Age of Onset  . Hypertension Mother   . Cancer Sister   . Hypertension Sister   . Cancer Brother   . Colon cancer Neg Hx   . Colon polyps Neg Hx   . Esophageal cancer Neg Hx   . Rectal cancer Neg Hx   . Stomach cancer Neg Hx     Past Surgical History:  Procedure Laterality Date  . INCISE AND DRAIN ABCESS     dog bite    ROS: Review of Systems Negative except as above PHYSICAL EXAM: BP (!) 156/99   Pulse 63   Temp (!) 97.5 F (36.4 C) (Oral)   Resp 16   Ht 5\' 4"  (1.626 m)   Wt 129 lb (58.5 kg)   SpO2 100%   BMI 22.14 kg/m   Wt Readings from Last 3 Encounters:  04/27/18 129 lb (58.5 kg)  12/25/17 120 lb 9.6 oz (54.7 kg)  10/26/17 118 lb (53.5 kg)    Physical Exam General appearance - alert, older African-American male who appears slightly unkept, and in no distress Mental status - normal mood, behavior, speech, dress, motor activity, and thought processes Nose - normal and patent, no erythema,  discharge or polyps Mouth - mucous membranes moist, pharynx normal without lesions.  Poor oral hygiene. Neck -  supple, no significant adenopathy Chest -slightly decreased.  No wheezes, crackles or rhonchi is heard at this time. Heart - normal rate, regular rhythm, normal S1, S2, no murmurs, rubs, clicks or gallops Extremities -no lower extremity edema.   ASSESSMENT AND PLAN: 1. Moderate persistent asthma without complication 2. Centrilobular emphysema ([place]) Went over the difference between his rescue inhaler being Ventolin and Symbicort as his maintenance inhaler.  Informed patient that he specifically asked to request refills from the pharmacy whenever he runs out of either of his inhalers Continue to encourage him towards smoking cessation.  He wants to quit but is finding it difficult getting over the hump of 1 to 3 cigarettes a day.  Encouraged him to set a quit date About 3 minutes spent on smoking cessation counseling - budesonide-formoterol (SYMBICORT) 80-4.5 MCG/ACT inhaler; Inhale 2 puffs into the lungs 2 (two) times daily.  Dispense: 1 Inhaler; Refill: 12   3. Essential hypertension Not at goal.  Patient plans to pick up refill on Norvasc today.  Encouraged him to return to the pharmacy for refills whenever he runs out. - amLODipine (NORVASC) 5 MG tablet; Take 0.5 tablets (2.5 mg total) by mouth daily.  Dispense: 45 tablet; Refill: 6  4. Hepatitis C virus infection cured after antiviral drug therapy Liver health discussed.  Advised to avoid excessive consumption of alcohol.  I went over how much is too much  5. Tobacco dependence See #1 above  6. Primary insomnia Patient is wanting to stop the trazodone and try something else for insomnia.  We will give a trial of melatonin. - Melatonin 3 MG TABS; Take one tab 1 hr prior to bedtime  Dispense: 60 tablet; Refill: 2  7. Enuresis Advised patient to avoid drinking caffeinated beverages in the evenings as these are irritants to  the bladder.  Also discussed timed voiding and encouraged him to set his alarm on his phone to get up around 2 or 3:00 in the morning to urinate  8. Seasonal allergic rhinitis, unspecified trigger - loratadine (CLARITIN) 10 MG tablet; Take 1 tablet (10 mg total) by mouth daily.  Dispense: 30 tablet; Refill: 4   Patient was given the opportunity to ask questions.  Patient verbalized understanding of the plan and was able to repeat key elements of the plan.   No orders of the defined types were placed in this encounter.    Requested Prescriptions    No prescriptions requested or ordered in this encounter    No follow-ups on file.  Karle Plumber, MD, FACP

## 2018-08-02 ENCOUNTER — Ambulatory Visit: Payer: Medicare HMO | Admitting: Internal Medicine

## 2018-08-02 ENCOUNTER — Other Ambulatory Visit: Payer: Self-pay

## 2018-08-02 ENCOUNTER — Ambulatory Visit: Payer: Medicare HMO | Attending: Internal Medicine | Admitting: Internal Medicine

## 2018-08-02 DIAGNOSIS — J4540 Moderate persistent asthma, uncomplicated: Secondary | ICD-10-CM | POA: Diagnosis not present

## 2018-08-02 DIAGNOSIS — B182 Chronic viral hepatitis C: Secondary | ICD-10-CM | POA: Diagnosis not present

## 2018-08-02 DIAGNOSIS — J432 Centrilobular emphysema: Secondary | ICD-10-CM

## 2018-08-02 DIAGNOSIS — I2510 Atherosclerotic heart disease of native coronary artery without angina pectoris: Secondary | ICD-10-CM

## 2018-08-02 DIAGNOSIS — F17200 Nicotine dependence, unspecified, uncomplicated: Secondary | ICD-10-CM

## 2018-08-02 DIAGNOSIS — I10 Essential (primary) hypertension: Secondary | ICD-10-CM | POA: Diagnosis not present

## 2018-08-02 NOTE — Progress Notes (Signed)
Virtual Visit via Telephone Note Due to current restrictions/limitations of in-office visits due to the COVID-19 pandemic, this scheduled clinical appointment was converted to a telehealth visit  I connected with Jason Wong on 08/02/18 at 4:42 p.m EDT by telephone and verified that I am speaking with the correct person using two identifiers. I am in my office.  The patient is on the bus terminal.  Only the patient and myself participated in this encounter.  I discussed the limitations, risks, security and privacy concerns of performing an evaluation and management service by telephone and the availability of in person appointments. I also discussed with the patient that there may be a patient responsible charge related to this service. The patient expressed understanding and agreed to proceed.   History of Present Illness: Pt with hx of HTN, tob, seasonal allergies, asthma, centrilobular emphysema, insomnia, coronary atherosclerosis and hep C treated.  Patient last seen 04/2018   Tob dep:  Still smoking 3 a day.  On last visit we had talked about setting a quit date.  He has not done that as yet.  States he plans to try Chantix again and still has the medication at home.  HTN:  Out of Norvasc x few mths.  States that he recently called the pharmacy to get a refill on his blood pressure medicine and inhalers and was told that his co-pay is $90 which he does not feel he can afford.  He tries to limit salt in the foods.  Denies any chest pains or shortness of breath.  No lower extremity swelling. Coronary atherosclerosis noted on low-dose CT done for screening for lung cancer back in October of last year.  Emphysema:  Coughing and wheezing at nights.  Out of inhalers x 2 mths due to cost.  History of hep C: Successfully treated  Observations/Objective: No direct observation done as this was a telephone encounter  Assessment and Plan: 1. Moderate persistent asthma without complication 2.  Centrilobular emphysema ([place]) I confirm with the pharmacy the co-pay on the albuterol and Symbicort were $45 each.  I will check with our clinical pharmacist to see whether he would have a cheaper co-pay with Incruse inhaler or 1 of the other combination steroid inhalers.  I told him we will get back to him in 1-2 business days  3. Essential hypertension Level of control unknown but likely uncontrolled.  Cost of the Norvasc inhaler is 90 cents.  Patient states he cannot afford that so this will be mailed out to him - CBC; Future - Comprehensive metabolic panel; Future - Lipid panel; Future  4. Tobacco dependence Advised to quit.  Encouraged him to set a quit date beyond which she would not purchase any further cigarettes  5. Chronic hepatitis C without hepatic coma (HCC) Successfully treated  6. Atherosclerosis of native coronary artery of native heart without angina pectoris He will check come fasting to the lab for lipid profile.  Recommend taking a baby aspirin which he can purchase over-the-counter. - Comprehensive metabolic panel; Future - Lipid panel; Future   Follow Up Instructions: 2 mths   I discussed the assessment and treatment plan with the patient. The patient was provided an opportunity to ask questions and all were answered. The patient agreed with the plan and demonstrated an understanding of the instructions.   The patient was advised to call back or seek an in-person evaluation if the symptoms worsen or if the condition fails to improve as anticipated.  I provided 15 minutes  of non-face-to-face time during this encounter.   Karle Plumber, MD

## 2018-08-06 ENCOUNTER — Telehealth: Payer: Self-pay | Admitting: Internal Medicine

## 2018-08-06 NOTE — Telephone Encounter (Signed)
-----   Message from Tresa Endo, [place] sent at 08/03/2018  9:14 AM EDT ----- Hello Dr. Wynetta Emery -   Ran those requested in addition to various albuterol products (Proair, generic proair, generic Ventolin).   All products are $45.00. Co-pay cards unavailable with Medicare. I believe this is an issue of his insurance not wanting to cover all inhaled products well.   ----- Message ----- From: Ladell Pier, MD Sent: 08/02/2018   6:02 PM EDT To: Tresa Endo, RPH-CPP  This patient's co-pay on albuterol and Symbicort are $45 each.  He states that he cannot afford.  Can you try running Incruse, Breo or Advair to see whether his co-pay would be less and let me know?  I told patient I will get back to him within 1 to 2 days.  Thanks

## 2018-08-07 ENCOUNTER — Other Ambulatory Visit: Payer: Self-pay

## 2018-08-07 ENCOUNTER — Ambulatory Visit: Payer: Medicare HMO | Attending: Family Medicine

## 2018-08-07 DIAGNOSIS — I2510 Atherosclerotic heart disease of native coronary artery without angina pectoris: Secondary | ICD-10-CM

## 2018-08-07 DIAGNOSIS — I10 Essential (primary) hypertension: Secondary | ICD-10-CM

## 2018-08-07 NOTE — Telephone Encounter (Signed)
Pt states would like his inhalers and his bp medicine mailed out

## 2018-08-08 ENCOUNTER — Other Ambulatory Visit: Payer: Self-pay | Admitting: Internal Medicine

## 2018-08-08 LAB — COMPREHENSIVE METABOLIC PANEL
ALT: 14 [IU]/L (ref 0–44)
AST: 24 [IU]/L (ref 0–40)
Albumin/Globulin Ratio: 1.8 (ref 1.2–2.2)
Albumin: 5.1 g/dL — ABNORMAL HIGH (ref 3.8–4.8)
Alkaline Phosphatase: 101 [IU]/L (ref 39–117)
BUN/Creatinine Ratio: 11 (ref 10–24)
BUN: 13 mg/dL (ref 8–27)
Bilirubin Total: 0.3 mg/dL (ref 0.0–1.2)
CO2: 21 mmol/L (ref 20–29)
Calcium: 10.4 mg/dL — ABNORMAL HIGH (ref 8.6–10.2)
Chloride: 104 mmol/L (ref 96–106)
Creatinine, Ser: 1.18 mg/dL (ref 0.76–1.27)
GFR calc Af Amer: 75 mL/min/{1.73_m2} (ref >59)
GFR calc non Af Amer: 65 mL/min/{1.73_m2} (ref >59)
Globulin, Total: 2.9 g/dL (ref 1.5–4.5)
Glucose: 84 mg/dL (ref 65–99)
Potassium: 4.7 mmol/L (ref 3.5–5.2)
Sodium: 145 mmol/L — ABNORMAL HIGH (ref 134–144)
Total Protein: 8 g/dL (ref 6.0–8.5)

## 2018-08-08 LAB — LIPID PANEL
Chol/HDL Ratio: 2.6 {ratio} (ref 0.0–5.0)
Cholesterol, Total: 198 mg/dL (ref 100–199)
HDL: 77 mg/dL (ref >39)
LDL Calculated: 101 mg/dL — ABNORMAL HIGH (ref 0–99)
Triglycerides: 99 mg/dL (ref 0–149)
VLDL Cholesterol Cal: 20 mg/dL (ref 5–40)

## 2018-08-08 LAB — CBC
Hematocrit: 47.7 % (ref 37.5–51.0)
Hemoglobin: 16.1 g/dL (ref 13.0–17.7)
MCH: 32.1 pg (ref 26.6–33.0)
MCHC: 33.8 g/dL (ref 31.5–35.7)
MCV: 95 fL (ref 79–97)
Platelets: 279 10*3/uL (ref 150–450)
RBC: 5.01 x10E6/uL (ref 4.14–5.80)
RDW: 12.4 % (ref 11.6–15.4)
WBC: 5.9 10*3/uL (ref 3.4–10.8)

## 2018-08-08 MED ORDER — ATORVASTATIN CALCIUM 10 MG PO TABS: 10.0000 mg | ORAL_TABLET | Freq: Every day | ORAL | 3 refills | Status: DC

## 2018-08-15 ENCOUNTER — Telehealth: Payer: Self-pay

## 2018-08-15 NOTE — Telephone Encounter (Signed)
Contacted pt to go over lab results pt is aware and doesn't have any questions or concerns 

## 2018-09-14 MED FILL — AMLODIPINE BESYLATE 5 MG TA: 5 | 30 days supply | Qty: 15 | Fill #0

## 2018-09-14 MED FILL — SYMBICORT 80-4.5 MCG INH: 80-4.5 | 30 days supply | Qty: 10 | Fill #0

## 2018-09-14 MED FILL — ATORVASTATIN 10 MG TABLET: 10 | 30 days supply | Qty: 30 | Fill #0

## 2018-10-19 ENCOUNTER — Other Ambulatory Visit: Payer: Self-pay

## 2018-10-19 ENCOUNTER — Ambulatory Visit: Payer: Medicare HMO | Attending: Internal Medicine | Admitting: Internal Medicine

## 2019-01-03 ENCOUNTER — Ambulatory Visit: Payer: Medicare HMO | Attending: Internal Medicine | Admitting: Internal Medicine

## 2019-01-03 ENCOUNTER — Ambulatory Visit (HOSPITAL_BASED_OUTPATIENT_CLINIC_OR_DEPARTMENT_OTHER): Payer: Medicare HMO | Admitting: Pharmacist

## 2019-01-03 ENCOUNTER — Encounter: Payer: Self-pay | Admitting: Internal Medicine

## 2019-01-03 ENCOUNTER — Other Ambulatory Visit: Payer: Self-pay

## 2019-01-03 VITALS — BP 145/99 | HR 63 | Temp 97.5°F | Resp 16 | Wt 122.4 lb

## 2019-01-03 DIAGNOSIS — F17200 Nicotine dependence, unspecified, uncomplicated: Secondary | ICD-10-CM

## 2019-01-03 DIAGNOSIS — Z23 Encounter for immunization: Secondary | ICD-10-CM | POA: Diagnosis not present

## 2019-01-03 DIAGNOSIS — E7800 Pure hypercholesterolemia, unspecified: Secondary | ICD-10-CM

## 2019-01-03 DIAGNOSIS — F17210 Nicotine dependence, cigarettes, uncomplicated: Secondary | ICD-10-CM

## 2019-01-03 DIAGNOSIS — J449 Chronic obstructive pulmonary disease, unspecified: Secondary | ICD-10-CM | POA: Diagnosis not present

## 2019-01-03 DIAGNOSIS — J4540 Moderate persistent asthma, uncomplicated: Secondary | ICD-10-CM

## 2019-01-03 DIAGNOSIS — I10 Essential (primary) hypertension: Secondary | ICD-10-CM | POA: Diagnosis not present

## 2019-01-03 DIAGNOSIS — B182 Chronic viral hepatitis C: Secondary | ICD-10-CM | POA: Diagnosis not present

## 2019-01-03 DIAGNOSIS — J432 Centrilobular emphysema: Secondary | ICD-10-CM

## 2019-01-03 MED ORDER — BUDESONIDE-FORMOTEROL FUMARATE 80-4.5 MCG/ACT IN AERO: 2.0000 | INHALATION_SPRAY | Freq: Two times a day (BID) | RESPIRATORY_TRACT | 12 refills | Status: DC

## 2019-01-03 MED ORDER — VENTOLIN HFA 108 (90 BASE) MCG/ACT IN AERS: INHALATION_SPRAY | RESPIRATORY_TRACT | 11 refills | Status: DC

## 2019-01-03 MED ORDER — ATORVASTATIN CALCIUM 10 MG PO TABS: 10.0000 mg | ORAL_TABLET | Freq: Every day | ORAL | 3 refills | Status: DC

## 2019-01-03 MED ORDER — AMLODIPINE BESYLATE 5 MG PO TABS: 2.5000 mg | ORAL_TABLET | Freq: Every day | ORAL | 3 refills | Status: DC

## 2019-01-03 MED FILL — AMLODIPINE BESYLATE 5 MG TA: 5 | 30 days supply | Qty: 15 | Fill #0

## 2019-01-03 MED FILL — VENTOLIN HFA 90 MCG INHALER: 108 (90 BAS | 25 days supply | Qty: 18 | Fill #0

## 2019-01-03 MED FILL — ATORVASTATIN 10 MG TABLET: 10 | 30 days supply | Qty: 30 | Fill #0

## 2019-01-03 MED FILL — SYMBICORT 80-4.5 MCG INH: 80-4.5 | 30 days supply | Qty: 10 | Fill #0

## 2019-01-03 NOTE — Patient Instructions (Signed)
Influenza Virus Vaccine injection (Fluarix) What is this medicine? INFLUENZA VIRUS VACCINE (in floo EN zuh VAHY ruhs vak SEEN) helps to reduce the risk of getting influenza also known as the flu. This medicine may be used for other purposes; ask your health care provider or pharmacist if you have questions. COMMON BRAND NAME(S): Fluarix, Fluzone What should I tell my health care provider before I take this medicine? They need to know if you have any of these conditions:  bleeding disorder like hemophilia  fever or infection  Guillain-Barre syndrome or other neurological problems  immune system problems  infection with the human immunodeficiency virus (HIV) or AIDS  low blood platelet counts  multiple sclerosis  an unusual or allergic reaction to influenza virus vaccine, eggs, chicken proteins, latex, gentamicin, other medicines, foods, dyes or preservatives  pregnant or trying to get pregnant  breast-feeding How should I use this medicine? This vaccine is for injection into a muscle. It is given by a health care professional. A copy of Vaccine Information Statements will be given before each vaccination. Read this sheet carefully each time. The sheet may change frequently. Talk to your pediatrician regarding the use of this medicine in children. Special care may be needed. Overdosage: If you think you have taken too much of this medicine contact a poison control center or emergency room at once. NOTE: This medicine is only for you. Do not share this medicine with others. What if I miss a dose? This does not apply. What may interact with this medicine?  chemotherapy or radiation therapy  medicines that lower your immune system like etanercept, anakinra, infliximab, and adalimumab  medicines that treat or prevent blood clots like warfarin  phenytoin  steroid medicines like prednisone or cortisone  theophylline  vaccines This list may not describe all possible  interactions. Give your health care provider a list of all the medicines, herbs, non-prescription drugs, or dietary supplements you use. Also tell them if you smoke, drink alcohol, or use illegal drugs. Some items may interact with your medicine. What should I watch for while using this medicine? Report any side effects that do not go away within 3 days to your doctor or health care professional. Call your health care provider if any unusual symptoms occur within 6 weeks of receiving this vaccine. You may still catch the flu, but the illness is not usually as bad. You cannot get the flu from the vaccine. The vaccine will not protect against colds or other illnesses that may cause fever. The vaccine is needed every year. What side effects may I notice from receiving this medicine? Side effects that you should report to your doctor or health care professional as soon as possible:  allergic reactions like skin rash, itching or hives, swelling of the face, lips, or tongue Side effects that usually do not require medical attention (report to your doctor or health care professional if they continue or are bothersome):  fever  headache  muscle aches and pains  pain, tenderness, redness, or swelling at site where injected  weak or tired This list may not describe all possible side effects. Call your doctor for medical advice about side effects. You may report side effects to FDA at 1-800-FDA-1088. Where should I keep my medicine? This vaccine is only given in a clinic, pharmacy, doctor's office, or other health care setting and will not be stored at home. NOTE: This sheet is a summary. It may not cover all possible information. If you have questions   about this medicine, talk to your doctor, pharmacist, or health care provider.  2020 Elsevier/Gold Standard (2007-10-03 09:30:40)  

## 2019-01-03 NOTE — Progress Notes (Signed)
Patient presents for vaccination against influenza per orders of Dr. Johnson. Consent given. Counseling provided. No contraindications exists. Vaccine administered without incident.   

## 2019-01-03 NOTE — Progress Notes (Signed)
Patient ID: Jason Wong, male    DOB: 12-Nov-1955  MRN: [id number]  CC: Asthma and Hypertension   Subjective: Jason Wong is a 63 y.o. male who presents for chronic ds management His concerns today include:  Pt with hx of HTN, tob, seasonal allergies, asthma, centrilobular emphysema, insomnia, coronary atherosclerosis, pre-DMand hep C treated.   HTN:  Out of Norvasc x 3 mths.  He tells me just has not made it to the pharmacy until now.  He rides the bus.  His last prescription was written for 87-month supply so that he is not having to come back every month to get medicine refilled. He denies any chest pains or shortness of breath.  No lower extremity edema.  No chronic headaches or dizziness  Asthma/COPD:  Out of inhalers x 3 mths for the same reason given for him being out of his blood pressure medication -On last visit he told me that he had problems affording his inhalers because of his co-pay of $45.  I had the clinical pharmacist check to see if any of the other inhalers would be cheaper but they all had the same co-pay.  He reports shortness of breath with wheezing especially with exertion. He continues to smoke but tells me he has cut back to 3 cigs/day.   Trying to quit.  Chantix prescribed for him in the past but currently not taking.  HL:  LDL elev on last visit.  Rec starting Lipitor.  He tells me that he was told the information but he has not picked up the prescription as yet.  He tells me that he has aspirin at home but has not been taking it consistently.  Calcium mildly elevated on blood test done on last visit.  Plan to recheck today along with a parathyroid hormone level.   Patient Active Problem List   Diagnosis Date Noted  . Hepatitis C virus infection cured after antiviral drug therapy 04/27/2018  . Enuresis 04/27/2018  . Centrilobular emphysema ([place]) 04/27/2018  . Seasonal allergic rhinitis 04/27/2018  . Tobacco dependence 08/24/2017  . Weight loss,  unintentional 08/24/2017  . Chronic viral hepatitis C ([place]) 06/27/2017  . Insomnia 06/27/2017  . Prediabetes 01/24/2017  . Asthma 06/10/2016  . Essential hypertension 06/10/2016  . Current every day smoker 06/10/2016     Current Outpatient Medications on File Prior to Visit  Medication Sig Dispense Refill  . loratadine (CLARITIN) 10 MG tablet Take 1 tablet (10 mg total) by mouth daily. 30 tablet 4  . Melatonin 3 MG TABS Take one tab 1 hr prior to bedtime 60 tablet 2  . vitamin E 1000 UNIT capsule Take 1,000 Units by mouth daily.     No current facility-administered medications on file prior to visit.     No Known Allergies  Social History   Socioeconomic History  . Marital status: Single    Spouse name: Not on file  . Number of children: Not on file  . Years of education: Not on file  . Highest education level: Not on file  Occupational History  . Not on file  Social Needs  . Financial resource strain: Not on file  . Food insecurity    Worry: Not on file    Inability: Not on file  . Transportation needs    Medical: Not on file    Non-medical: Not on file  Tobacco Use  . Smoking status: Current Every Day Smoker    Packs/day: 0.50    Years:  45.00    Pack years: 22.50    Types: Cigarettes  . Smokeless tobacco: Never Used  Substance and Sexual Activity  . Alcohol use: Yes    Alcohol/week: 14.0 standard drinks    Types: 14 Cans of beer per week  . Drug use: Not Currently    Types: "Crack" cocaine  . Sexual activity: Yes    Partners: Female  Lifestyle  . Physical activity    Days per week: Not on file    Minutes per session: Not on file  . Stress: Not on file  Relationships  . Social Herbalist on phone: Not on file    Gets together: Not on file    Attends religious service: Not on file    Active member of club or organization: Not on file    Attends meetings of clubs or organizations: Not on file    Relationship status: Not on file  . Intimate  partner violence    Fear of current or ex partner: Not on file    Emotionally abused: Not on file    Physically abused: Not on file    Forced sexual activity: Not on file  Other Topics Concern  . Not on file  Social History Narrative  . Not on file    Family History  Problem Relation Age of Onset  . Hypertension Mother   . Cancer Sister   . Hypertension Sister   . Cancer Brother   . Colon cancer Neg Hx   . Colon polyps Neg Hx   . Esophageal cancer Neg Hx   . Rectal cancer Neg Hx   . Stomach cancer Neg Hx     Past Surgical History:  Procedure Laterality Date  . INCISE AND DRAIN ABCESS     dog bite    ROS: Review of Systems Negative except as stated above  PHYSICAL EXAM: BP (!) 145/99   Pulse 63   Temp (!) 97.5 F (36.4 C) (Oral)   Resp 16   Wt 122 lb 6.4 oz (55.5 kg)   SpO2 98%   BMI 21.01 kg/m   Wt Readings from Last 3 Encounters:  01/03/19 122 lb 6.4 oz (55.5 kg)  04/27/18 129 lb (58.5 kg)  12/25/17 120 lb 9.6 oz (54.7 kg)    Physical Exam  General appearance - alert, well appearing, and in no distress Mental status - normal mood, behavior, speech, dress, motor activity, and thought processes Mouth - mucous membranes moist, pharynx normal without lesions Neck - supple, no significant adenopathy Chest -breath sounds mildly decreased bilaterally.  No wheezes heard Heart - normal rate, regular rhythm, normal S1, S2, no murmurs, rubs, clicks or gallops Extremities - peripheral pulses normal, no pedal edema, no clubbing or cyanosis  CMP Latest Ref Rng & Units 08/07/2018 10/03/2017 06/27/2017  Glucose 65 - 99 mg/dL 84 58(L) 101(H)  BUN 8 - 27 mg/dL 13 19 13   Creatinine 0.76 - 1.27 mg/dL 1.18 1.18 1.20  Sodium 134 - 144 mmol/L 145(H) 138 144  Potassium 3.5 - 5.2 mmol/L 4.7 4.8 4.9  Chloride 96 - 106 mmol/L 104 103 107  CO2 20 - 29 mmol/L 21 26 30   Calcium 8.6 - D34-534 mg/dL 10.4(H) 10.3 10.2  Total Protein 6.0 - 8.5 g/dL 8.0 8.1 7.7  Total Bilirubin 0.0 - 1.2  mg/dL 0.3 0.9 0.8  Alkaline Phos 39 - 117 IU/L 101 - -  AST 0 - 40 IU/L 24 27 188(H)  ALT 0 -  44 IU/L 14 21 164(H)   Lipid Panel     Component Value Date/Time   CHOL 198 08/07/2018 0846   TRIG 99 08/07/2018 0846   HDL 77 08/07/2018 0846   CHOLHDL 2.6 08/07/2018 0846   LDLCALC 101 (H) 08/07/2018 0846    CBC    Component Value Date/Time   WBC 5.9 08/07/2018 0846   WBC 5.2 06/27/2017 1418   RBC 5.01 08/07/2018 0846   RBC 4.92 06/27/2017 1418   HGB 16.1 08/07/2018 0846   HCT 47.7 08/07/2018 0846   PLT 279 08/07/2018 0846   MCV 95 08/07/2018 0846   MCH 32.1 08/07/2018 0846   MCH 33.7 (H) 06/27/2017 1418   MCHC 33.8 08/07/2018 0846   MCHC 35.5 06/27/2017 1418   RDW 12.4 08/07/2018 0846   LYMPHSABS 2.5 06/13/2016 1110   EOSABS 0.1 06/13/2016 1110   BASOSABS 0.0 06/13/2016 1110    ASSESSMENT AND PLAN: 1. Essential hypertension Not at goal.  Patient plans to stop by the pharmacy today to pick up medications.  I encourage compliance.  I have written prescription for 28-month supply at a time for convenience. - amLODipine (NORVASC) 5 MG tablet; Take 0.5 tablets (2.5 mg total) by mouth daily.  Dispense: 45 tablet; Refill: 3  2. Moderate persistent asthma without complication Patient plans to try to get his inhalers today.  Advised to quit smoking. - budesonide-formoterol (SYMBICORT) 80-4.5 MCG/ACT inhaler; Inhale 2 puffs into the lungs 2 (two) times daily.  Dispense: 1 Inhaler; Refill: 12 - VENTOLIN HFA 108 (90 Base) MCG/ACT inhaler; INHALE 2 PUFFS INTO THE LUNGS EVERY 6 HOURS AS NEEDED FOR WHEEZING OR SHORTNESS OF BREATH.  Dispense: 18 g; Refill: 11  3. Centrilobular emphysema ([place]) See #2 above  4. Tobacco dependence Advised to quit.  I have commended him on cutting back.  I have encouraged him to set a quit date.  Less than 5 minutes spent on counseling  5. Pure hypercholesterolemia - atorvastatin (LIPITOR) 10 MG tablet; Take 1 tablet (10 mg total) by mouth daily.   Dispense: 90 tablet; Refill: 3  6. Need for influenza vaccination Given  7. Hypercalcemia - PTH, Intact and Calcium     Patient was given the opportunity to ask questions.  Patient verbalized understanding of the plan and was able to repeat key elements of the plan.   Orders Placed This Encounter  Procedures  . PTH, Intact and Calcium     Requested Prescriptions   Signed Prescriptions Disp Refills  . amLODipine (NORVASC) 5 MG tablet 45 tablet 3    Sig: Take 0.5 tablets (2.5 mg total) by mouth daily.  Marland Kitchen atorvastatin (LIPITOR) 10 MG tablet 90 tablet 3    Sig: Take 1 tablet (10 mg total) by mouth daily.  . budesonide-formoterol (SYMBICORT) 80-4.5 MCG/ACT inhaler 1 Inhaler 12    Sig: Inhale 2 puffs into the lungs 2 (two) times daily.  . VENTOLIN HFA 108 (90 Base) MCG/ACT inhaler 18 g 11    Sig: INHALE 2 PUFFS INTO THE LUNGS EVERY 6 HOURS AS NEEDED FOR WHEEZING OR SHORTNESS OF BREATH.    Return in about 3 months (around 04/05/2019).  Karle Plumber, MD, FACP

## 2019-01-04 LAB — PTH, INTACT AND CALCIUM: PTH: 14 pg/mL — ABNORMAL LOW (ref 15–65)

## 2019-01-06 ENCOUNTER — Other Ambulatory Visit: Payer: Self-pay | Admitting: Internal Medicine

## 2019-01-08 ENCOUNTER — Telehealth: Payer: Self-pay

## 2019-01-08 NOTE — Telephone Encounter (Signed)
Contacted pt to go over lab results pt didn't answer and was unable to lvm due to call can't be completed at this time  

## 2019-04-22 ENCOUNTER — Ambulatory Visit: Payer: Medicare HMO | Attending: Internal Medicine | Admitting: Internal Medicine

## 2019-04-22 ENCOUNTER — Encounter: Payer: Self-pay | Admitting: Internal Medicine

## 2019-04-22 ENCOUNTER — Telehealth: Payer: Self-pay

## 2019-04-22 ENCOUNTER — Other Ambulatory Visit: Payer: Self-pay

## 2019-04-22 VITALS — BP 145/90 | HR 72 | Resp 16 | Wt 125.0 lb

## 2019-04-22 DIAGNOSIS — I10 Essential (primary) hypertension: Secondary | ICD-10-CM

## 2019-04-22 DIAGNOSIS — J432 Centrilobular emphysema: Secondary | ICD-10-CM | POA: Diagnosis not present

## 2019-04-22 DIAGNOSIS — F17210 Nicotine dependence, cigarettes, uncomplicated: Secondary | ICD-10-CM

## 2019-04-22 DIAGNOSIS — F5101 Primary insomnia: Secondary | ICD-10-CM

## 2019-04-22 DIAGNOSIS — Z8619 Personal history of other infectious and parasitic diseases: Secondary | ICD-10-CM

## 2019-04-22 DIAGNOSIS — F17200 Nicotine dependence, unspecified, uncomplicated: Secondary | ICD-10-CM

## 2019-04-22 MED ORDER — MELATONIN 3 MG PO TABS: ORAL_TABLET | ORAL | 2 refills | Status: DC

## 2019-04-22 MED FILL — SYMBICORT 80-4.5 MCG INH: 80-4.5 | 30 days supply | Qty: 10 | Fill #0

## 2019-04-22 MED FILL — VENTOLIN HFA 90 MCG INHALER: 108 (90 BAS | 25 days supply | Qty: 18 | Fill #0

## 2019-04-22 MED FILL — ATORVASTATIN 10 MG TABLET: 10 | 30 days supply | Qty: 30 | Fill #0

## 2019-04-22 MED FILL — AMLODIPINE BESYLATE 5 MG TA: 5 | 30 days supply | Qty: 15 | Fill #0

## 2019-04-22 NOTE — Telephone Encounter (Addendum)
Met with the patient when he was in the clinic today and he explained that he has not been able to afford his medications He said that he received a message from [place] him that his medications would be free but he has not been able to contact anyone at [facility] to confirm this.  He currently lives with his sister and his income is about $700/month, he does not receive food stamps.  Provided him with the phone number for [facility] to apply for the medicare extra help program and Medicare savings plan to assist with medication coverage if he is eligible.  Also provided him with a medicaid application. He said that he has never applied for medicaid and his sister in law can assist him with completing the application.  Instructed him to take the completed application to DSS.    He picked up all prescription medications  today at [facility] and was able to charge the medication costs to an account that he can pay off over time. He was not able to pick up the loratadine and melatonin as they are OTC.

## 2019-04-22 NOTE — Progress Notes (Signed)
Patient ID: Jason Wong, male    DOB: [birth year]  MRN: [id number]  CC: Hypertension and Asthma   Subjective: Jason Wong is a 64 y.o. male who presents for chronic ds management His concerns today include:  Pt with hx of HTN, tob,seasonal allergies,asthma,centrilobular emphysema,insomnia, coronary atherosclerosis, pre-DMand hep Ctreated.  Last seen 12/2018 HYPERTENSION/HL Currently taking: see medication list Med Adherence: []  Yes    [x]  No - RF given on Norvasc and Lipitor on last visit but never pick them up due to lack of co-pay Medication side effects: []  Yes    [x]  No Adherence with salt restriction: []  Yes    [x]  No Home Monitoring?: []  Yes    [x]  No Monitoring Frequency: []  Yes    []  No Home BP results range: []  Yes    []  No SOB? [x]  Yes    []  No Chest Pain?: []  Yes    [x]  No Leg swelling?: []  Yes    [x]  No Headaches?: [x]  Yes - sometimes    []  No Dizziness? []  Yes    [x]  No Comments:   Asthma/COPD:  Never got inhalers due to being unable to afford co-pay.  Michela Pitcher he was called by his insurance and told he can get his meds for free.  -still smoking 3 cig a day.  "I've been slowing down and I'm gonna quit." -reports intermittent sneezing and coughing which occurs when he is around dust and other irritants.  Better if he goes outside  Still having problems sleeping at nights Drinks a 16 oz can of beer QOD. Not using anything to help with sleep.  I would prescribe melatonin for him.  However he has not picked that up. - in bed about 9 p.m.  Takes about 30 mins to fall asleep.  Then up at 11 p.m and not able tto fall asleep again.  Does not feel sleepy during the day    Patient Active Problem List   Diagnosis Date Noted  . Hepatitis C virus infection cured after antiviral drug therapy 04/27/2018  . Enuresis 04/27/2018  . Centrilobular emphysema ([place]) 04/27/2018  . Seasonal allergic rhinitis 04/27/2018  . Tobacco dependence 08/24/2017  . Weight loss,  unintentional 08/24/2017  . Chronic viral hepatitis C ([place]) 06/27/2017  . Insomnia 06/27/2017  . Prediabetes 01/24/2017  . Asthma 06/10/2016  . Essential hypertension 06/10/2016  . Current every day smoker 06/10/2016     Current Outpatient Medications on File Prior to Visit  Medication Sig Dispense Refill  . amLODipine (NORVASC) 5 MG tablet Take 0.5 tablets (2.5 mg total) by mouth daily. 45 tablet 3  . atorvastatin (LIPITOR) 10 MG tablet Take 1 tablet (10 mg total) by mouth daily. 90 tablet 3  . budesonide-formoterol (SYMBICORT) 80-4.5 MCG/ACT inhaler Inhale 2 puffs into the lungs 2 (two) times daily. 1 Inhaler 12  . loratadine (CLARITIN) 10 MG tablet Take 1 tablet (10 mg total) by mouth daily. 30 tablet 4  . Melatonin 3 MG TABS Take one tab 1 hr prior to bedtime 60 tablet 2  . VENTOLIN HFA 108 (90 Base) MCG/ACT inhaler INHALE 2 PUFFS INTO THE LUNGS EVERY 6 HOURS AS NEEDED FOR WHEEZING OR SHORTNESS OF BREATH. 18 g 11  . vitamin E 1000 UNIT capsule Take 1,000 Units by mouth daily.     No current facility-administered medications on file prior to visit.    No Known Allergies  Social History   Socioeconomic History  . Marital status: Single    Spouse  name: Not on file  . Number of children: Not on file  . Years of education: Not on file  . Highest education level: Not on file  Occupational History  . Not on file  Tobacco Use  . Smoking status: Current Every Day Smoker    Packs/day: 0.50    Years: 45.00    Pack years: 22.50    Types: Cigarettes  . Smokeless tobacco: Never Used  Substance and Sexual Activity  . Alcohol use: Yes    Alcohol/week: 14.0 standard drinks    Types: 14 Cans of beer per week  . Drug use: Not Currently    Types: "Crack" cocaine  . Sexual activity: Yes    Partners: Female  Other Topics Concern  . Not on file  Social History Narrative  . Not on file   Social Determinants of Health   Financial Resource Strain:   . Difficulty of Paying Living  Expenses: Not on file  Food Insecurity:   . Worried About Charity fundraiser in the Last Year: Not on file  . Ran Out of Food in the Last Year: Not on file  Transportation Needs:   . Lack of Transportation (Medical): Not on file  . Lack of Transportation (Non-Medical): Not on file  Physical Activity:   . Days of Exercise per Week: Not on file  . Minutes of Exercise per Session: Not on file  Stress:   . Feeling of Stress : Not on file  Social Connections:   . Frequency of Communication with Friends and Family: Not on file  . Frequency of Social Gatherings with Friends and Family: Not on file  . Attends Religious Services: Not on file  . Active Member of Clubs or Organizations: Not on file  . Attends Archivist Meetings: Not on file  . Marital Status: Not on file  Intimate Partner Violence:   . Fear of Current or Ex-Partner: Not on file  . Emotionally Abused: Not on file  . Physically Abused: Not on file  . Sexually Abused: Not on file    Family History  Problem Relation Age of Onset  . Hypertension Mother   . Cancer Sister   . Hypertension Sister   . Cancer Brother   . Colon cancer Neg Hx   . Colon polyps Neg Hx   . Esophageal cancer Neg Hx   . Rectal cancer Neg Hx   . Stomach cancer Neg Hx     Past Surgical History:  Procedure Laterality Date  . INCISE AND DRAIN ABCESS     dog bite    ROS: Review of Systems Negative except as stated above  PHYSICAL EXAM: BP (!) 145/90   Pulse 72   Resp 16   Wt 125 lb (56.7 kg)   SpO2 97%   BMI 21.46 kg/m   Wt Readings from Last 3 Encounters:  04/22/19 125 lb (56.7 kg)  01/03/19 122 lb 6.4 oz (55.5 kg)  04/27/18 129 lb (58.5 kg)    Physical Exam  General appearance - alert, well appearing, and in no distress Mental status - normal mood, behavior, speech, dress, motor activity, and thought processes Nose - normal and patent, no erythema, discharge or polyps Neck - supple, no significant adenopathy Chest  -breath sounds slightly decreased without wheezes or crackles Heart - normal rate, regular rhythm, normal S1, S2, no murmurs, rubs, clicks or gallops Extremities - peripheral pulses normal, no pedal edema, no clubbing or cyanosis  CMP Latest Ref Rng &  Units 01/03/2019 08/07/2018 10/03/2017  Glucose 65 - 99 mg/dL - 84 58(L)  BUN 8 - 27 mg/dL - 13 19  Creatinine 0.76 - 1.27 mg/dL - 1.18 1.18  Sodium 134 - 144 mmol/L - 145(H) 138  Potassium 3.5 - 5.2 mmol/L - 4.7 4.8  Chloride 96 - 106 mmol/L - 104 103  CO2 20 - 29 mmol/L - 21 26  Calcium mg/dL CANCELED 10.4(H) 10.3  Total Protein 6.0 - 8.5 g/dL - 8.0 8.1  Total Bilirubin 0.0 - 1.2 mg/dL - 0.3 0.9  Alkaline Phos 39 - 117 IU/L - 101 -  AST 0 - 40 IU/L - 24 27  ALT 0 - 44 IU/L - 14 21   Lipid Panel     Component Value Date/Time   CHOL 198 08/07/2018 0846   TRIG 99 08/07/2018 0846   HDL 77 08/07/2018 0846   CHOLHDL 2.6 08/07/2018 0846   LDLCALC 101 (H) 08/07/2018 0846    CBC    Component Value Date/Time   WBC 5.9 08/07/2018 0846   WBC 5.2 06/27/2017 1418   RBC 5.01 08/07/2018 0846   RBC 4.92 06/27/2017 1418   HGB 16.1 08/07/2018 0846   HCT 47.7 08/07/2018 0846   PLT 279 08/07/2018 0846   MCV 95 08/07/2018 0846   MCH 32.1 08/07/2018 0846   MCH 33.7 (H) 06/27/2017 1418   MCHC 33.8 08/07/2018 0846   MCHC 35.5 06/27/2017 1418   RDW 12.4 08/07/2018 0846   LYMPHSABS 2.5 06/13/2016 1110   EOSABS 0.1 06/13/2016 1110   BASOSABS 0.0 06/13/2016 1110    ASSESSMENT AND PLAN: 1. Essential hypertension Not at goal due to him not having his medication.  I have spoken with our caseworker.  She will look into whether his insurance will pay for medications that are sent to [facility] mail delivery service   2. Tobacco dependence Advised to quit.  Commended him that he has not increased the amount that he is smoking but I have encouraged him to quit.  He tells me that he is working on doing so.  He does not want any medications to help him  quit at this time because he is not able to afford his other medications as is.  Encouraged him to cut back by 1 cigarette every 2 weeks and set a quit date  3. Centrilobular emphysema ([place]) Caseworker is looking into whether his insurance will cover his medications through mail order  4. Primary insomnia Good sleep hygiene discussed and encouraged. - Melatonin 3 MG TABS; Take one tab 1 hr prior to bedtime  Dispense: 60 tablet; Refill: 2  5. Hepatitis C virus infection cured after antiviral drug therapy Patient has been cured.  Last viral RNA was checked in December 2019 and was undetectable.    Patient was given the opportunity to ask questions.  Patient verbalized understanding of the plan and was able to repeat key elements of the plan.   No orders of the defined types were placed in this encounter.    Requested Prescriptions    No prescriptions requested or ordered in this encounter    No follow-ups on file.  Karle Plumber, MD, FACP

## 2019-07-25 ENCOUNTER — Ambulatory Visit: Payer: Medicare Other | Attending: Internal Medicine | Admitting: Internal Medicine

## 2019-07-25 ENCOUNTER — Other Ambulatory Visit: Payer: Self-pay

## 2019-09-06 ENCOUNTER — Ambulatory Visit: Payer: Medicare Other | Attending: Internal Medicine | Admitting: Internal Medicine

## 2019-09-06 ENCOUNTER — Encounter: Payer: Self-pay | Admitting: Internal Medicine

## 2019-09-06 ENCOUNTER — Other Ambulatory Visit: Payer: Self-pay

## 2019-09-06 VITALS — BP 150/90 | HR 65 | Temp 97.3°F | Resp 16 | Wt 118.8 lb

## 2019-09-06 DIAGNOSIS — J432 Centrilobular emphysema: Secondary | ICD-10-CM

## 2019-09-06 DIAGNOSIS — Z114 Encounter for screening for human immunodeficiency virus [HIV]: Secondary | ICD-10-CM

## 2019-09-06 DIAGNOSIS — F17200 Nicotine dependence, unspecified, uncomplicated: Secondary | ICD-10-CM

## 2019-09-06 DIAGNOSIS — J4540 Moderate persistent asthma, uncomplicated: Secondary | ICD-10-CM

## 2019-09-06 DIAGNOSIS — E7800 Pure hypercholesterolemia, unspecified: Secondary | ICD-10-CM | POA: Diagnosis not present

## 2019-09-06 DIAGNOSIS — I10 Essential (primary) hypertension: Secondary | ICD-10-CM

## 2019-09-06 DIAGNOSIS — Z7189 Other specified counseling: Secondary | ICD-10-CM

## 2019-09-06 DIAGNOSIS — R634 Abnormal weight loss: Secondary | ICD-10-CM

## 2019-09-06 MED ORDER — VENTOLIN HFA 108 (90 BASE) MCG/ACT IN AERS: INHALATION_SPRAY | RESPIRATORY_TRACT | 11 refills | Status: DC

## 2019-09-06 MED ORDER — AMLODIPINE BESYLATE 5 MG PO TABS: 2.5000 mg | ORAL_TABLET | Freq: Every day | ORAL | 3 refills | Status: DC

## 2019-09-06 MED ORDER — BUDESONIDE-FORMOTEROL FUMARATE 80-4.5 MCG/ACT IN AERO: 2.0000 | INHALATION_SPRAY | Freq: Two times a day (BID) | RESPIRATORY_TRACT | 12 refills | Status: DC

## 2019-09-06 MED ORDER — ATORVASTATIN CALCIUM 10 MG PO TABS: 10.0000 mg | ORAL_TABLET | Freq: Every day | ORAL | 3 refills | Status: DC

## 2019-09-06 NOTE — Progress Notes (Signed)
Patient ID: General Wearing, male    DOB: April 07, 1955  MRN: [id number]  CC: Hypertension   Subjective: Jason Wong is a 64 y.o. male who presents for chronic ds management His concerns today include:  Pt with hx of HTN, tob,seasonal allergies,asthma,centrilobular emphysema,insomnia, coronary atherosclerosis, pre-DMand hep Ctreated.  On last visit, pt was seen by case worker.  She helped him in getting his meds from our pharmacy that day on credit.  She also gave him the phone number for [facility] to apply for Medicare extra help program and Medicare savings plan to assist with medication coverage if he is eligible.. Today he has [employer] [facility] card through [facility].  -he still however, has been out of all meds x 2 mths.  When asked why he has not picked up refills at the pharmacy he tells me "I've been busy." -no HA/dizziness/CP/LE edema  COPD/asthma: "breathing has not been too good." -out of inhalers. -wheezing and SOB sometimes with ambulation.  Little cough. No SOB at nights.  Still smoking about 3 cigarettes a day. -Does not want COVID vaccine.  "I just don't trust that shot."  He has some wgh loss Reports dec appetite x few mths.  Does not eat BF and lunch.  Only eats dinner. Denies any issues with getting food.  Lives with sister and she cooks.  Denies any problems with depression.  Drinks a beer once a wk.  No fever/night sweats, palpitations.   Patient Active Problem List   Diagnosis Date Noted  . Hepatitis C virus infection cured after antiviral drug therapy 04/27/2018  . Enuresis 04/27/2018  . Centrilobular emphysema ([place]) 04/27/2018  . Seasonal allergic rhinitis 04/27/2018  . Tobacco dependence 08/24/2017  . Weight loss, unintentional 08/24/2017  . Insomnia 06/27/2017  . Prediabetes 01/24/2017  . Asthma 06/10/2016  . Essential hypertension 06/10/2016  . Current every day smoker 06/10/2016     Current Outpatient Medications on File Prior to Visit  Medication Sig  Dispense Refill  . amLODipine (NORVASC) 5 MG tablet Take 0.5 tablets (2.5 mg total) by mouth daily. 45 tablet 3  . atorvastatin (LIPITOR) 10 MG tablet Take 1 tablet (10 mg total) by mouth daily. 90 tablet 3  . budesonide-formoterol (SYMBICORT) 80-4.5 MCG/ACT inhaler Inhale 2 puffs into the lungs 2 (two) times daily. 1 Inhaler 12  . loratadine (CLARITIN) 10 MG tablet Take 1 tablet (10 mg total) by mouth daily. 30 tablet 4  . Melatonin 3 MG TABS Take one tab 1 hr prior to bedtime 60 tablet 2  . VENTOLIN HFA 108 (90 Base) MCG/ACT inhaler INHALE 2 PUFFS INTO THE LUNGS EVERY 6 HOURS AS NEEDED FOR WHEEZING OR SHORTNESS OF BREATH. 18 g 11  . vitamin E 1000 UNIT capsule Take 1,000 Units by mouth daily.     No current facility-administered medications on file prior to visit.    No Known Allergies  Social History   Socioeconomic History  . Marital status: Single    Spouse name: Not on file  . Number of children: Not on file  . Years of education: Not on file  . Highest education level: Not on file  Occupational History  . Not on file  Tobacco Use  . Smoking status: Current Every Day Smoker    Packs/day: 0.50    Years: 45.00    Pack years: 22.50    Types: Cigarettes  . Smokeless tobacco: Never Used  Substance and Sexual Activity  . Alcohol use: Yes    Alcohol/week:  14.0 standard drinks    Types: 14 Cans of beer per week  . Drug use: Not Currently    Types: "Crack" cocaine  . Sexual activity: Yes    Partners: Female  Other Topics Concern  . Not on file  Social History Narrative  . Not on file   Social Determinants of Health   Financial Resource Strain:   . Difficulty of Paying Living Expenses:   Food Insecurity:   . Worried About Charity fundraiser in the Last Year:   . Arboriculturist in the Last Year:   Transportation Needs:   . Film/video editor (Medical):   Marland Kitchen Lack of Transportation (Non-Medical):   Physical Activity:   . Days of Exercise per Week:   . Minutes of  Exercise per Session:   Stress:   . Feeling of Stress :   Social Connections:   . Frequency of Communication with Friends and Family:   . Frequency of Social Gatherings with Friends and Family:   . Attends Religious Services:   . Active Member of Clubs or Organizations:   . Attends Archivist Meetings:   Marland Kitchen Marital Status:   Intimate Partner Violence:   . Fear of Current or Ex-Partner:   . Emotionally Abused:   Marland Kitchen Physically Abused:   . Sexually Abused:     Family History  Problem Relation Age of Onset  . Hypertension Mother   . Cancer Sister   . Hypertension Sister   . Cancer Brother   . Colon cancer Neg Hx   . Colon polyps Neg Hx   . Esophageal cancer Neg Hx   . Rectal cancer Neg Hx   . Stomach cancer Neg Hx     Past Surgical History:  Procedure Laterality Date  . INCISE AND DRAIN ABCESS     dog bite    ROS: Review of Systems Negative except as stated above  PHYSICAL EXAM: BP (!) 130/96   Pulse 65   Temp (!) 97.3 F (36.3 C)   Resp 16   Wt 118 lb 12.8 oz (53.9 kg)   SpO2 96%   BMI 20.39 kg/m   Wt Readings from Last 3 Encounters:  09/06/19 118 lb 12.8 oz (53.9 kg)  04/22/19 125 lb (56.7 kg)  01/03/19 122 lb 6.4 oz (55.5 kg)    Physical Exam  General appearance - alert, well appearing, older African-American male and in no distress.  Slightly unkept. Mental status -patient answers questions appropriately. Mouth -oral mucosa is moist.  Poor oral hygiene. Neck - supple, no significant adenopathy Chest -breath sounds mildly decreased with some scattered wheezes. Heart - normal rate, regular rhythm, normal S1, S2, no murmurs, rubs, clicks or gallops Extremities -no lower extremity edema.  CMP Latest Ref Rng & Units 01/03/2019 08/07/2018 10/03/2017  Glucose 65 - 99 mg/dL - 84 58(L)  BUN 8 - 27 mg/dL - 13 19  Creatinine 0.76 - 1.27 mg/dL - 1.18 1.18  Sodium 134 - 144 mmol/L - 145(H) 138  Potassium 3.5 - 5.2 mmol/L - 4.7 4.8  Chloride 96 - 106  mmol/L - 104 103  CO2 20 - 29 mmol/L - 21 26  Calcium mg/dL CANCELED 10.4(H) 10.3  Total Protein 6.0 - 8.5 g/dL - 8.0 8.1  Total Bilirubin 0.0 - 1.2 mg/dL - 0.3 0.9  Alkaline Phos 39 - 117 IU/L - 101 -  AST 0 - 40 IU/L - 24 27  ALT 0 - 44 IU/L - 14  21   Lipid Panel     Component Value Date/Time   CHOL 198 08/07/2018 0846   TRIG 99 08/07/2018 0846   HDL 77 08/07/2018 0846   CHOLHDL 2.6 08/07/2018 0846   LDLCALC 101 (H) 08/07/2018 0846    CBC    Component Value Date/Time   WBC 5.9 08/07/2018 0846   WBC 5.2 06/27/2017 1418   RBC 5.01 08/07/2018 0846   RBC 4.92 06/27/2017 1418   HGB 16.1 08/07/2018 0846   HCT 47.7 08/07/2018 0846   PLT 279 08/07/2018 0846   MCV 95 08/07/2018 0846   MCH 32.1 08/07/2018 0846   MCH 33.7 (H) 06/27/2017 1418   MCHC 33.8 08/07/2018 0846   MCHC 35.5 06/27/2017 1418   RDW 12.4 08/07/2018 0846   LYMPHSABS 2.5 06/13/2016 1110   EOSABS 0.1 06/13/2016 1110   BASOSABS 0.0 06/13/2016 1110    ASSESSMENT AND PLAN: 1. Essential hypertension Not at goal.  He [facility]  He will pick it up today or this weekend.  Encourage compliance with medication. - CBC - Comprehensive metabolic panel - amLODipine (NORVASC) 5 MG tablet; Take 0.5 tablets (2.5 mg total) by mouth daily.  Dispense: 45 tablet; Refill: 3  2. Centrilobular emphysema ([place]) 3. Moderate persistent asthma without complication Strongly advised smoking cessation.  He is not ready to give a trial of quitting.  Less than 5 minutes spent on counseling.  Refills given on inhalers. - VENTOLIN HFA 108 (90 Base) MCG/ACT inhaler; INHALE 2 PUFFS INTO THE LUNGS EVERY 6 HOURS AS NEEDED FOR WHEEZING OR SHORTNESS OF BREATH.  Dispense: 18 g; Refill: 11 - budesonide-formoterol (SYMBICORT) 80-4.5 MCG/ACT inhaler; Inhale 2 puffs into the lungs 2 (two) times daily.  Dispense: 1 Inhaler; Refill: 12  4. Pure hypercholesterolemia - Lipid panel - atorvastatin  (LIPITOR) 10 MG tablet; Take 1 tablet (10 mg total) by mouth daily.  Dispense: 90 tablet; Refill: 3  5. Weight loss, unintentional Advised patient to try eating smaller more frequent meals.  He is up-to-date with colon cancer screening. - TSH - HIV antibody (with reflex)  6. Encounter for screening for HIV - HIV antibody (with reflex)  7.  COVID-19 education Discussed COVID-19 vaccines.  I told him that the vaccines are relatively safe and effective in preventing COVID-19 infections.  Strongly encouraged him to get the vaccine.  [facility].  8.  Tobacco dependence See #3 above  Patient was given the opportunity to ask questions.  Patient verbalized understanding of the plan and was able to repeat key elements of the plan.   No orders of the defined types were placed in this encounter.    Requested Prescriptions    No prescriptions requested or ordered in this encounter    No follow-ups on file.  Karle Plumber, MD, FACP

## 2019-09-07 LAB — COMPREHENSIVE METABOLIC PANEL
ALT: 17 [IU]/L (ref 0–44)
AST: 26 [IU]/L (ref 0–40)
Albumin/Globulin Ratio: 1.5 (ref 1.2–2.2)
Albumin: 4.9 g/dL — ABNORMAL HIGH (ref 3.8–4.8)
Alkaline Phosphatase: 121 [IU]/L (ref 48–121)
BUN/Creatinine Ratio: 10 (ref 10–24)
BUN: 11 mg/dL (ref 8–27)
Bilirubin Total: 0.4 mg/dL (ref 0.0–1.2)
CO2: 23 mmol/L (ref 20–29)
Calcium: 10.2 mg/dL (ref 8.6–10.2)
Chloride: 101 mmol/L (ref 96–106)
Creatinine, Ser: 1.11 mg/dL (ref 0.76–1.27)
GFR calc Af Amer: 81 mL/min/{1.73_m2} (ref >59)
GFR calc non Af Amer: 70 mL/min/{1.73_m2} (ref >59)
Globulin, Total: 3.3 g/dL (ref 1.5–4.5)
Glucose: 89 mg/dL (ref 65–99)
Potassium: 4.4 mmol/L (ref 3.5–5.2)
Sodium: 141 mmol/L (ref 134–144)
Total Protein: 8.2 g/dL (ref 6.0–8.5)

## 2019-09-07 LAB — LIPID PANEL
Chol/HDL Ratio: 2.4 {ratio} (ref 0.0–5.0)
Cholesterol, Total: 171 mg/dL (ref 100–199)
HDL: 70 mg/dL (ref >39)
LDL Chol Calc (NIH): 90 mg/dL (ref 0–99)
Triglycerides: 57 mg/dL (ref 0–149)
VLDL Cholesterol Cal: 11 mg/dL (ref 5–40)

## 2019-09-07 LAB — CBC
Hematocrit: 49.9 % (ref 37.5–51.0)
Hemoglobin: 17.2 g/dL (ref 13.0–17.7)
MCH: 33.9 pg — ABNORMAL HIGH (ref 26.6–33.0)
MCHC: 34.5 g/dL (ref 31.5–35.7)
MCV: 98 fL — ABNORMAL HIGH (ref 79–97)
Platelets: 274 10*3/uL (ref 150–450)
RBC: 5.08 x10E6/uL (ref 4.14–5.80)
RDW: 11.5 % — ABNORMAL LOW (ref 11.6–15.4)
WBC: 6.3 10*3/uL (ref 3.4–10.8)

## 2019-09-07 LAB — TSH: TSH: 0.58 u[IU]/mL (ref 0.450–4.500)

## 2019-09-07 LAB — HIV ANTIBODY (ROUTINE TESTING W REFLEX): HIV Screen 4th Generation wRfx: NONREACTIVE

## 2019-09-09 ENCOUNTER — Telehealth: Payer: Self-pay

## 2019-09-09 NOTE — Telephone Encounter (Signed)
Contacted pt to go over lab results pt was not home at the time of the call

## 2019-09-09 NOTE — Telephone Encounter (Signed)
-----   Message from Ladell Pier, MD sent at 09/08/2019 11:12 AM EDT ----- Blood count revealed no anemia. Kidney and liver function nl. Cholesterol level nl. Thyroid level nl. HIV test negative.

## 2019-11-28 IMAGING — US US ABDOMEN COMPLETE W/ ELASTOGRAPHY
1 series · 13 of 13 positions shown · non-contrast
Comparison: None.

CLINICAL DATA: Chronic hepatitis-C without hepatic coma.



[Series 1: us abdomen complete w/ elastography · 0.15mm/px · 13 of 13 slices shown]
[im 1/13]
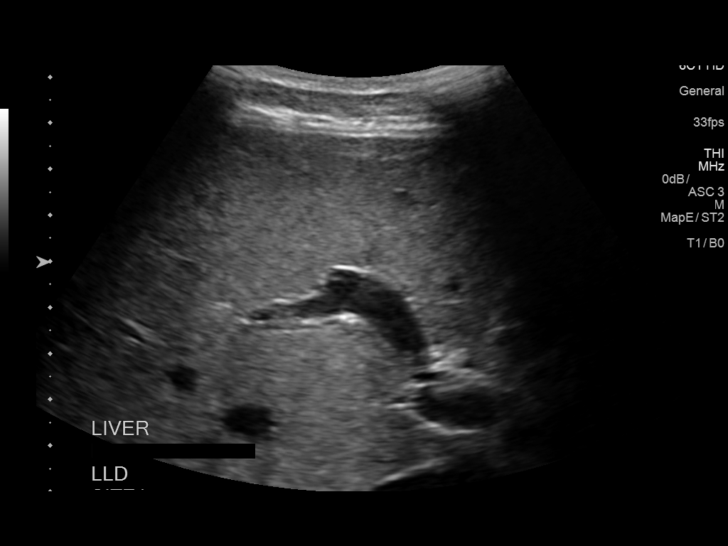
[im 2/13]
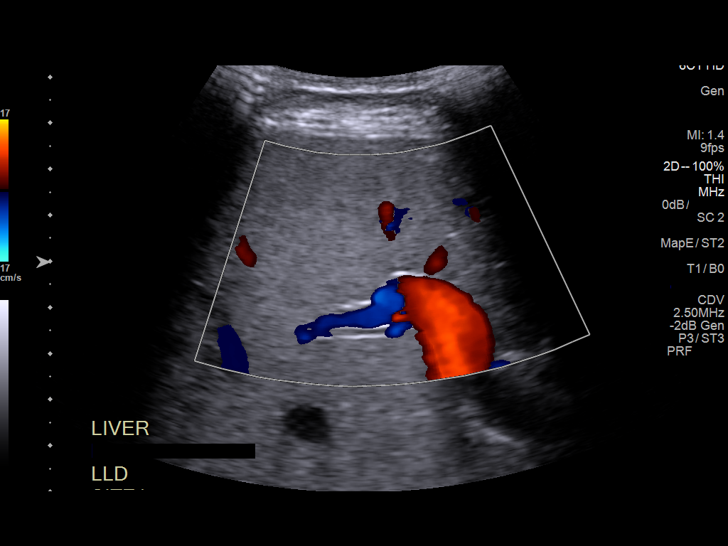
[im 3/13]
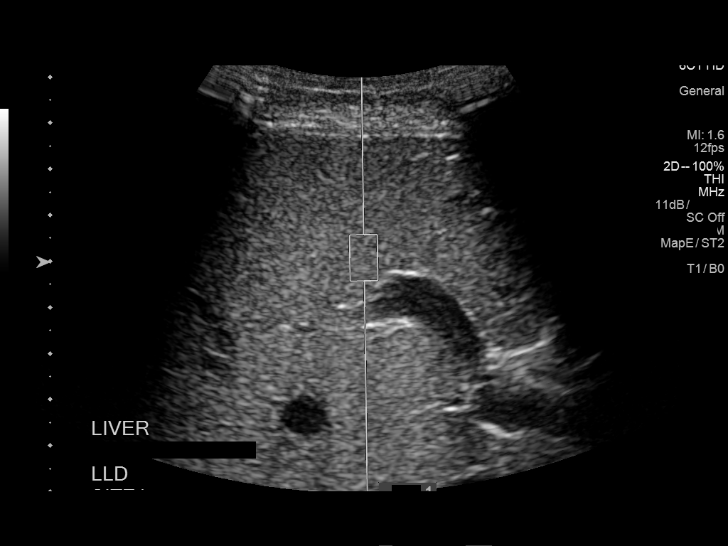
[im 4/13]
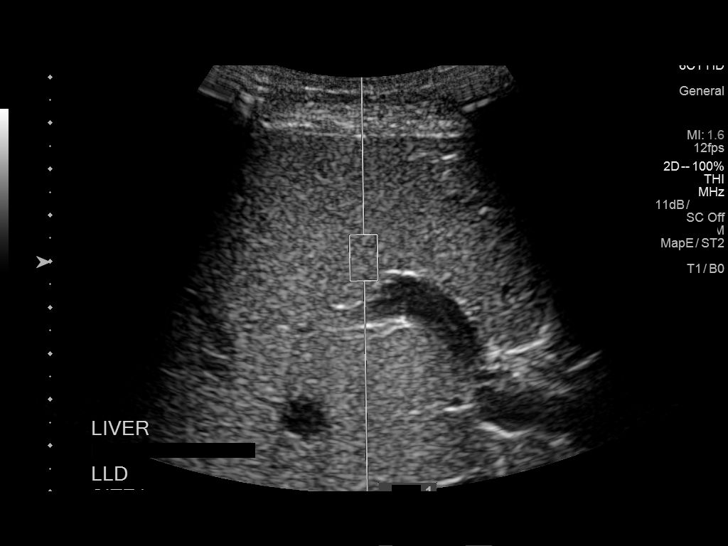
[im 5/13]
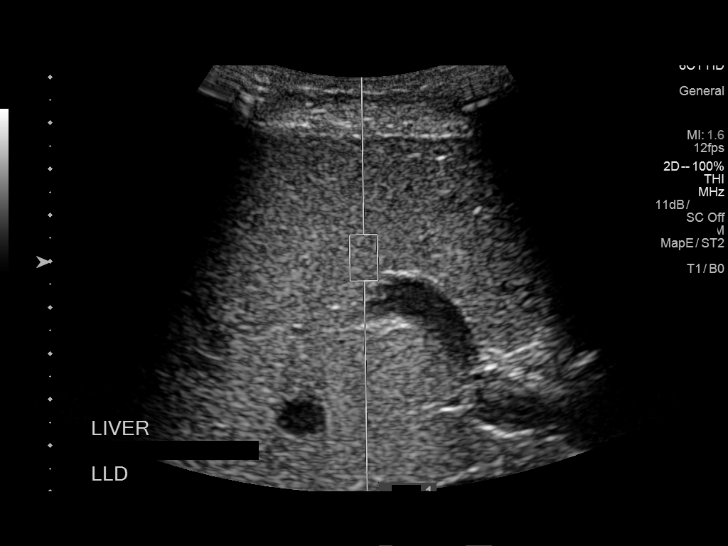
[im 6/13]
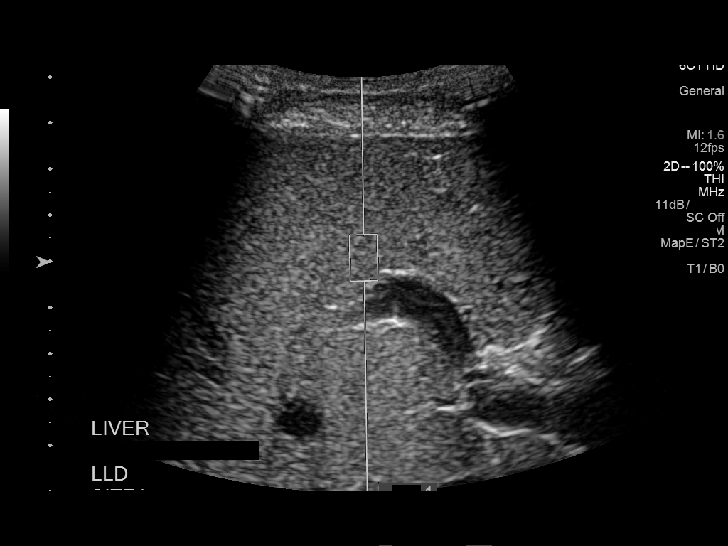
[im 7/13]
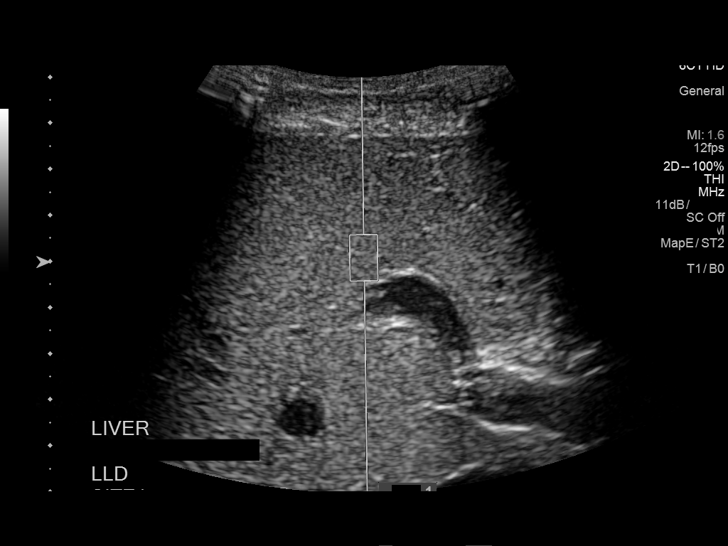
[im 8/13]
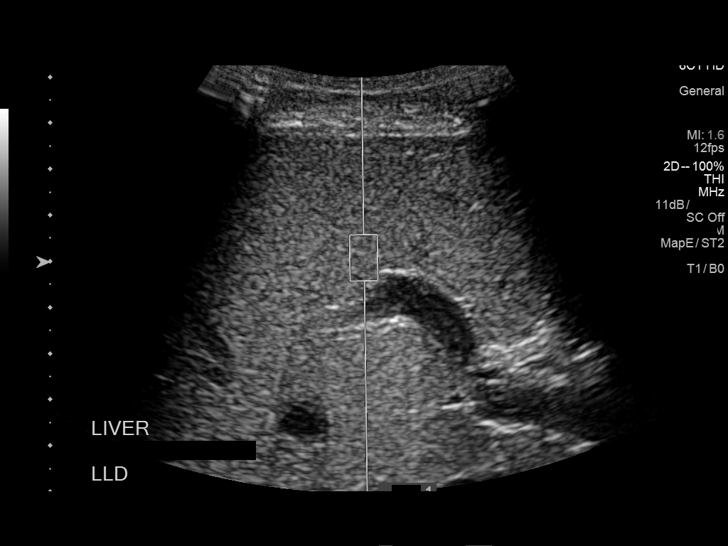
[im 9/13]
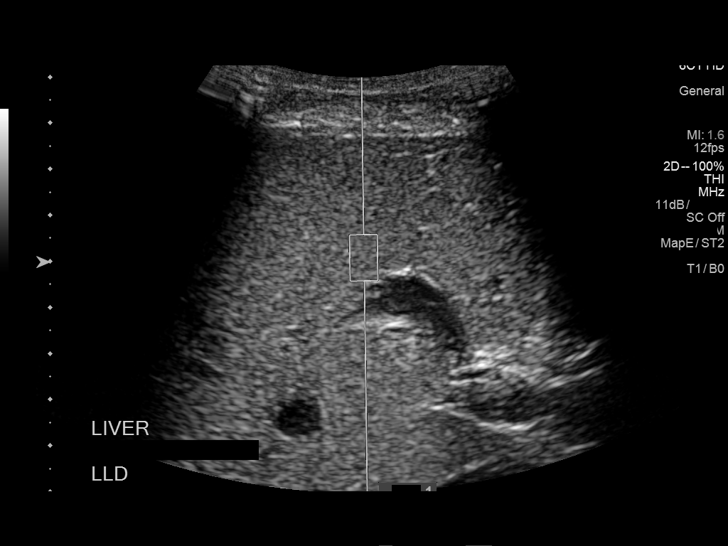
[im 10/13]
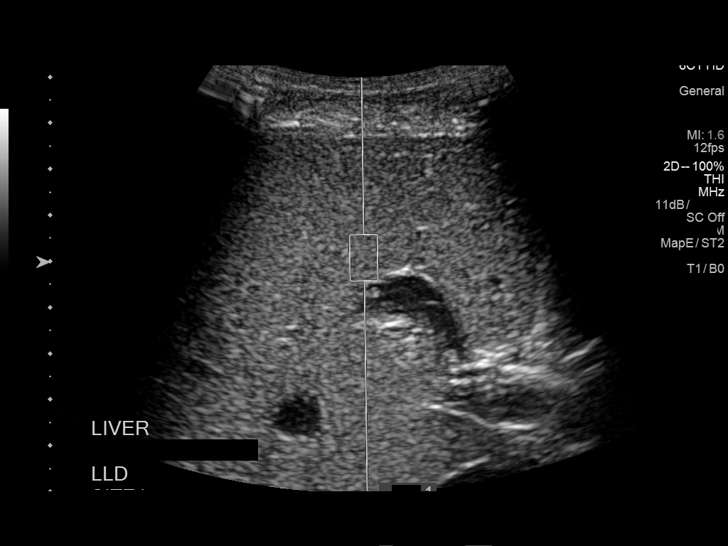
[im 11/13]
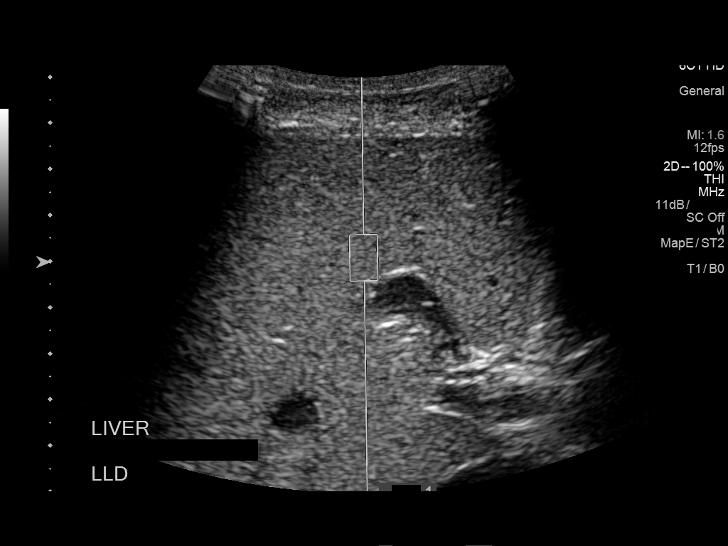
[im 12/13]
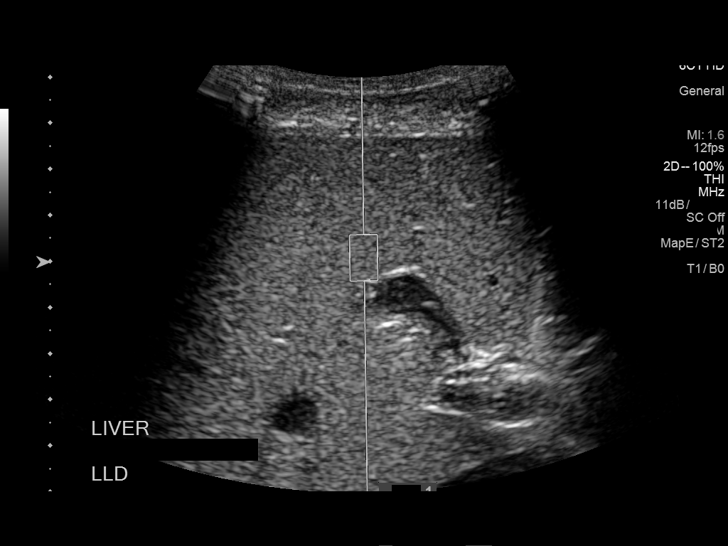
[im 13/13]
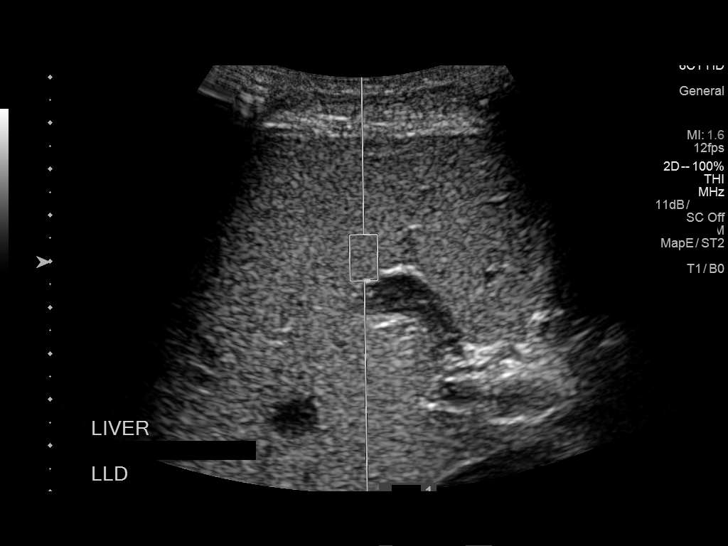

[13 of 13 positions shown; findings below may reference images not displayed]

FINDINGS: ULTRASOUND ABDOMEN

Gallbladder: No gallstones or wall thickening visualized. No
sonographic Murphy sign noted by sonographer.

Common bile duct: Diameter: 3 mm, within normal limits.

Liver: 8 mm simple appearing cyst seen in left hepatic lobe. No
liver mass identified.. Within normal limits in parenchymal
echogenicity. Portal vein is patent on color Doppler imaging with
normal direction of blood flow towards the liver.

IVC: No abnormality visualized.

Pancreas: Visualized portion unremarkable.

Spleen: Size and appearance within normal limits.

Right Kidney: Length: 10.3 cm. Echogenicity within normal limits. No
mass or hydronephrosis visualized.

Left Kidney: Length: 10.2 cm. Echogenicity within normal limits. No
mass or hydronephrosis visualized.

Abdominal aorta: No aneurysm visualized. Atherosclerotic plaque
noted.

Other findings: None.

ULTRASOUND HEPATIC ELASTOGRAPHY

Device: Siemens Helix VTQ

Patient position: Left Lateral Decubitus

Transducer [id number]

Number of measurements: 10

Hepatic segment:  8

Median velocity:   1.35 m/sec

IQR:

IQR/Median velocity ratio:

Corresponding Metavir fibrosis score:  F2 + some F3

Risk of fibrosis: Moderate

Limitations of exam: None

Please note that abnormal shear wave velocities may also be
identified in clinical settings other than with hepatic fibrosis,
such as: acute hepatitis, elevated right heart and central venous
pressures including use of beta blockers, Woodruff disease
([place]), infiltrative processes such as
mastocytosis/amyloidosis/infiltrative tumor, extrahepatic
cholestasis, in the post-prandial state, and liver transplantation.
Correlation with patient history, laboratory data, and clinical
condition recommended.
IMPRESSION: ULTRASOUND ABDOMEN:

Tiny hepatic cysts. No hepatic mass or other parenchymal abnormality
identified.

ULTRASOUND HEPATIC ELASTOGRAPHY:

Median hepatic shear wave velocity is calculated at 1.35 m/sec.

Corresponding Metavir fibrosis score is F2 + some F3.

Risk of fibrosis is Moderate.

Follow-up: Additional testing appropriate

## 2020-01-06 ENCOUNTER — Other Ambulatory Visit: Payer: Self-pay | Admitting: Pharmacist

## 2020-01-06 ENCOUNTER — Ambulatory Visit: Payer: Medicare Other | Attending: Internal Medicine | Admitting: Internal Medicine

## 2020-01-06 ENCOUNTER — Telehealth: Payer: Self-pay | Admitting: Internal Medicine

## 2020-01-06 ENCOUNTER — Other Ambulatory Visit: Payer: Self-pay

## 2020-01-06 DIAGNOSIS — R634 Abnormal weight loss: Secondary | ICD-10-CM

## 2020-01-06 DIAGNOSIS — J432 Centrilobular emphysema: Secondary | ICD-10-CM

## 2020-01-06 DIAGNOSIS — F17210 Nicotine dependence, cigarettes, uncomplicated: Secondary | ICD-10-CM

## 2020-01-06 DIAGNOSIS — I10 Essential (primary) hypertension: Secondary | ICD-10-CM

## 2020-01-06 DIAGNOSIS — Z23 Encounter for immunization: Secondary | ICD-10-CM

## 2020-01-06 DIAGNOSIS — J4540 Moderate persistent asthma, uncomplicated: Secondary | ICD-10-CM

## 2020-01-06 DIAGNOSIS — F17200 Nicotine dependence, unspecified, uncomplicated: Secondary | ICD-10-CM

## 2020-01-06 DIAGNOSIS — Z122 Encounter for screening for malignant neoplasm of respiratory organs: Secondary | ICD-10-CM | POA: Diagnosis not present

## 2020-01-06 MED ORDER — ALBUTEROL SULFATE HFA 108 (90 BASE) MCG/ACT IN AERS: 2.0000 | INHALATION_SPRAY | Freq: Four times a day (QID) | RESPIRATORY_TRACT | 2 refills | Status: DC | PRN

## 2020-01-06 MED ORDER — BUDESONIDE-FORMOTEROL FUMARATE 80-4.5 MCG/ACT IN AERO: 2.0000 | INHALATION_SPRAY | Freq: Two times a day (BID) | RESPIRATORY_TRACT | 2 refills | Status: DC

## 2020-01-06 MED ORDER — CHANTIX STARTING MONTH PAK 0.5 MG X 11 & 1 MG X 42 PO TABS: ORAL_TABLET | ORAL | 0 refills | Status: DC

## 2020-01-06 MED ORDER — AMLODIPINE BESYLATE 5 MG PO TABS: 2.5000 mg | ORAL_TABLET | Freq: Every day | ORAL | 6 refills | Status: DC

## 2020-01-06 NOTE — Telephone Encounter (Signed)
-----   Message from Tresa Endo, [place] sent at 01/06/2020  9:37 AM EDT ----- I called pharmacist Ronalee Belts) at [facility] and found the following:   1. Had to resend his albuterol HFA as Ship broker for insurance. 2. Pt  did not have a Symbicort on file. I sent this rx just now.  3. Amlodipine was being filled when I called.   4. I placed a refill for pt's atorvastatin.  5. Chantix is still on recall so was unavailable to be filled.  6. All others (Claritin, melatonin, Vit E) are filled OTC per insurance.    I requested pt  be placed on auto-fill each month. They call him when rx's are made ready.   Finally, their Covid vaccine supply depends on the day/pt volume. They currently stock Covid shots and direct patients to schedule appointments if there are no walk-ins available.  ----- Message ----- From: Ladell Pier, MD Sent: 01/06/2020   8:56 AM EDT To: Tresa Endo, RPH-CPP  I had a telephone visit with this patient today.  He has poor health literacy.  Every time I see him he feels his medicines for that month and then goes without medicines until he sees me again 3 or 4 months later even though I have explained to him repeatedly how the refill process works.  [facility] [address]  Can you let them know that he needs refills on all of his medicines including his inhalers and inquire whether they can set up some type of automated reminder for the patient each month to come get his refills?  I spoke with him on his visit with me in June about getting the COVID-19 vaccine and told him that he can get it at his pharmacy at [place].  He states he went there and told them what I said and "they acted like they did not know anything about it."  Please find out from them whether they carry the COVID-19 vaccines and request that they administer it to him when he comes to pick up his medicines.  Thanks

## 2020-01-06 NOTE — Progress Notes (Signed)
Virtual Visit via Telephone Note Due to current restrictions/limitations of in-office visits due to the COVID-19 pandemic, this scheduled clinical appointment was converted to a telehealth visit  I connected with Jason Wong on 01/06/20 at 8:34 a.m by telephone and verified that I am speaking with the correct person using two identifiers. I am in my office.  The patient is at home.  Only the patient, CMA Sallyanne Havers and myself participated in this encounter.  I discussed the limitations, risks, security and privacy concerns of performing an evaluation and management service by telephone and the availability of in person appointments. I also discussed with the patient that there may be a patient responsible charge related to this service. The patient expressed understanding and agreed to proceed.   History of Present Illness: Pt with hx of HTN, tob,seasonal allergies,asthma,centrilobular emphysema,insomnia, coronary atherosclerosis, pre-DMand hep Ctreated.Last seen 08/2019.  COPD/Asthma: reports he did not get his inhalers last time because his pharmacy did not give him any.  Out x 3 mths -reports he has been wheezing a little.  Some cough, nonproductive. -still smoking about 3 cigarettes/day.  Would like to try Chantix again; tried with it in past and found it helpful.  Smoked since the age of 82 a pack a day up until several years ago.  Last low-dose CT scan of the chest for lung cancer screening was back in 2019. Decided to get COVID vaccine.  [facility] "but they acted like they didn't know nothing about it."  Agreeable to getting flu vaccine  HTN: reports he has been out of Norvasc x 2-3 mths despite having refills on the prescription that I last wrote.  Denies any chest pains.  No lower extremity swelling.  No device to check BP Out of Lipitor for same reason  Wgh loss:  Not sure how much he wghs at this time.  Reports he has been eating more.   Eating 2 meals a day.  Usually no appetite for lunch.     Outpatient Encounter Medications as of 01/06/2020  Medication Sig   amLODipine (NORVASC) 5 MG tablet Take 0.5 tablets (2.5 mg total) by mouth daily.   atorvastatin (LIPITOR) 10 MG tablet Take 1 tablet (10 mg total) by mouth daily.   budesonide-formoterol (SYMBICORT) 80-4.5 MCG/ACT inhaler Inhale 2 puffs into the lungs 2 (two) times daily.   loratadine (CLARITIN) 10 MG tablet Take 1 tablet (10 mg total) by mouth daily.   Melatonin 3 MG TABS Take one tab 1 hr prior to bedtime   VENTOLIN HFA 108 (90 Base) MCG/ACT inhaler INHALE 2 PUFFS INTO THE LUNGS EVERY 6 HOURS AS NEEDED FOR WHEEZING OR SHORTNESS OF BREATH.   vitamin E 1000 UNIT capsule Take 1,000 Units by mouth daily.   No facility-administered encounter medications on file as of 01/06/2020.      Observations/Objective: No direct observation done as this was a telephone encounter. Results for orders placed or performed in visit on 09/06/19  CBC  Result Value Ref Range   WBC 6.3 3.4 - 10.8 x10E3/uL   RBC 5.08 4.14 - 5.80 x10E6/uL   Hemoglobin 17.2 13.0 - 17.7 g/dL   Hematocrit 49.9 37.5 - 51.0 %   MCV 98 (H) 79 - 97 fL   MCH 33.9 (H) 26.6 - 33.0 pg   MCHC 34.5 31 - 35 g/dL   RDW 11.5 (L) 11.6 - 15.4 %   Platelets 274 150 - 450 x10E3/uL  Comprehensive metabolic panel  Result Value Ref Range   Glucose 89 65 - 99 mg/dL   BUN 11 8 - 27 mg/dL   Creatinine, Ser 1.11 0.76 - 1.27 mg/dL   GFR calc non Af Amer 70 >59 mL/min/1.73   GFR calc Af Amer 81 >59 mL/min/1.73   BUN/Creatinine Ratio 10 10 - 24   Sodium 141 134 - 144 mmol/L   Potassium 4.4 3.5 - 5.2 mmol/L   Chloride 101 96 - 106 mmol/L   CO2 23 20 - 29 mmol/L   Calcium 10.2 8.6 - 10.2 mg/dL   Total Protein 8.2 6.0 - 8.5 g/dL   Albumin 4.9 (H) 3.8 - 4.8 g/dL   Globulin, Total 3.3 1.5 - 4.5 g/dL   Albumin/Globulin Ratio 1.5 1.2 - 2.2   Bilirubin Total 0.4 0.0 - 1.2 mg/dL   Alkaline Phosphatase 121 48 - 121  IU/L   AST 26 0 - 40 IU/L   ALT 17 0 - 44 IU/L  Lipid panel  Result Value Ref Range   Cholesterol, Total 171 100 - 199 mg/dL   Triglycerides 57 0 - 149 mg/dL   HDL 70 >39 mg/dL   VLDL Cholesterol Cal 11 5 - 40 mg/dL   LDL Chol Calc (NIH) 90 0 - 99 mg/dL   Chol/HDL Ratio 2.4 0.0 - 5.0 ratio  TSH  Result Value Ref Range   TSH 0.580 0.450 - 4.500 uIU/mL  HIV antibody (with reflex)  Result Value Ref Range   HIV Screen 4th Generation wRfx Non Reactive Non Reactive    Assessment and Plan: 1. Essential hypertension -Patient having problems with medication refills because of lack of understanding of the refill process.  I [facility] if [facility] can do monthly calls a reminder for him to come in and get refills on his medications. - amLODipine (NORVASC) 5 MG tablet; Take 0.5 tablets (2.5 mg total) by mouth daily.  Dispense: 15 tablet; Refill: 6 - Basic Metabolic Panel; Future  2. Centrilobular emphysema ([place]) Again will have [facility] clinical [facility] to see what is happening with refills on his inhalers Symbicort and albuterol.  3. Tobacco dependence Advised to quit.  Patient states he is wanting to quit but finds it difficult.  He would like to try Chantix again.  Less than 5 minutes spent on counseling. - varenicline (CHANTIX STARTING MONTH PAK) 0.5 MG X 11 & 1 MG X 42 tablet; Take one 0.5 mg tablet by mouth once daily for 3 days, then increase to one 0.5 mg tablet twice daily for 4 days, then increase to one 1 mg tablet twice daily.  Dispense: 53 tablet; Refill: 0  4. Weight loss, unintentional Patient encouraged to eat 3 meals a day.  HIV and TSH on last visit were normal.  5. Screening for lung cancer Patient with 30-pack-year history of smoking and still continues to smoke.  He is due for lung cancer screening with low-dose CT - CT CHEST LUNG CA SCREEN LOW DOSE W/O CM; Future  6. Need for influenza vaccination Patient scheduled to  come in tomorrow to get his flu shot from the clinical pharmacist.  7. Need for COVID-19 vaccine Clinical pharmacist [facility] to facilitate him getting the COVID-19 vaccine.   Follow Up Instructions: 3 mths   I discussed the assessment and treatment plan with the patient. The patient was provided an opportunity to ask questions and all were answered. The patient  agreed with the plan and demonstrated an understanding of the instructions.   The patient was advised to call back or seek an in-person evaluation if the symptoms worsen or if the condition fails to improve as anticipated.  I provided 18 minutes of non-face-to-face time during this encounter.   Karle Plumber, MD

## 2020-01-07 ENCOUNTER — Encounter: Payer: Self-pay | Admitting: Pharmacist

## 2020-01-07 ENCOUNTER — Other Ambulatory Visit: Payer: Self-pay

## 2020-01-07 ENCOUNTER — Ambulatory Visit: Payer: Medicare Other | Attending: Internal Medicine | Admitting: Pharmacist

## 2020-01-07 DIAGNOSIS — Z23 Encounter for immunization: Secondary | ICD-10-CM

## 2020-01-07 DIAGNOSIS — I10 Essential (primary) hypertension: Secondary | ICD-10-CM | POA: Diagnosis not present

## 2020-01-07 NOTE — Progress Notes (Signed)
Patient presents for vaccination against influenza per orders of Dr. Johnson. Consent given. Counseling provided. No contraindications exists. Vaccine administered without incident.  ° °Luke Van Ausdall, PharmD, CPP °Clinical Pharmacist °[facility] °[phone] ° °

## 2020-01-08 LAB — BASIC METABOLIC PANEL
BUN/Creatinine Ratio: 11 (ref 10–24)
BUN: 14 mg/dL (ref 8–27)
CO2: 25 mmol/L (ref 20–29)
Calcium: 10 mg/dL (ref 8.6–10.2)
Chloride: 101 mmol/L (ref 96–106)
Creatinine, Ser: 1.31 mg/dL — ABNORMAL HIGH (ref 0.76–1.27)
GFR calc Af Amer: 66 mL/min/{1.73_m2} (ref >59)
GFR calc non Af Amer: 57 mL/min/{1.73_m2} — ABNORMAL LOW (ref >59)
Glucose: 109 mg/dL — ABNORMAL HIGH (ref 65–99)
Potassium: 4.1 mmol/L (ref 3.5–5.2)
Sodium: 144 mmol/L (ref 134–144)

## 2020-01-22 ENCOUNTER — Other Ambulatory Visit: Payer: Self-pay

## 2020-01-22 ENCOUNTER — Ambulatory Visit (HOSPITAL_COMMUNITY)
Admission: RE | Admit: 2020-01-22 | Discharge: 2020-01-22 | Disposition: A | Discharge status: Home / Self Care | Payer: Medicare Other | Source: Ambulatory Visit | Attending: Internal Medicine | Admitting: Internal Medicine

## 2020-01-22 DIAGNOSIS — Z122 Encounter for screening for malignant neoplasm of respiratory organs: Secondary | ICD-10-CM | POA: Insufficient documentation

## 2020-01-22 DIAGNOSIS — J432 Centrilobular emphysema: Secondary | ICD-10-CM | POA: Insufficient documentation

## 2020-01-22 DIAGNOSIS — I700 Atherosclerosis of aorta: Secondary | ICD-10-CM | POA: Diagnosis not present

## 2020-01-22 DIAGNOSIS — F17210 Nicotine dependence, cigarettes, uncomplicated: Secondary | ICD-10-CM | POA: Insufficient documentation

## 2020-01-24 ENCOUNTER — Telehealth: Payer: Self-pay

## 2020-01-24 NOTE — Telephone Encounter (Signed)
-----   Message from Ladell Pier, MD sent at 01/23/2020 10:42 PM EDT ----- Let pt know that CT scan of chest reveals no masses or spots in the lungs.  Continue to work on trying to quit smoking.

## 2020-01-24 NOTE — Telephone Encounter (Signed)
Patient name and DOB has been verified Patient was informed of lab results. Patient had no questions.  

## 2020-04-07 ENCOUNTER — Ambulatory Visit: Payer: Medicare Other | Admitting: Internal Medicine

## 2020-04-27 ENCOUNTER — Ambulatory Visit: Payer: Medicare Other | Admitting: Internal Medicine

## 2020-04-28 ENCOUNTER — Other Ambulatory Visit: Payer: Self-pay

## 2020-04-28 ENCOUNTER — Ambulatory Visit: Payer: Medicare Other | Attending: Internal Medicine | Admitting: Internal Medicine

## 2020-04-28 ENCOUNTER — Other Ambulatory Visit: Payer: Self-pay | Admitting: Internal Medicine

## 2020-04-28 ENCOUNTER — Encounter: Payer: Self-pay | Admitting: Internal Medicine

## 2020-04-28 VITALS — BP 166/104 | HR 72 | Temp 97.6°F | Resp 16 | Ht 69.0 in | Wt 128.2 lb

## 2020-04-28 DIAGNOSIS — F5101 Primary insomnia: Secondary | ICD-10-CM | POA: Diagnosis not present

## 2020-04-28 DIAGNOSIS — F17210 Nicotine dependence, cigarettes, uncomplicated: Secondary | ICD-10-CM

## 2020-04-28 DIAGNOSIS — Z23 Encounter for immunization: Secondary | ICD-10-CM | POA: Diagnosis not present

## 2020-04-28 DIAGNOSIS — R413 Other amnesia: Secondary | ICD-10-CM | POA: Diagnosis not present

## 2020-04-28 DIAGNOSIS — Z0000 Encounter for general adult medical examination without abnormal findings: Secondary | ICD-10-CM | POA: Diagnosis not present

## 2020-04-28 DIAGNOSIS — F17200 Nicotine dependence, unspecified, uncomplicated: Secondary | ICD-10-CM | POA: Diagnosis not present

## 2020-04-28 DIAGNOSIS — J432 Centrilobular emphysema: Secondary | ICD-10-CM

## 2020-04-28 DIAGNOSIS — I10 Essential (primary) hypertension: Secondary | ICD-10-CM

## 2020-04-28 DIAGNOSIS — E7800 Pure hypercholesterolemia, unspecified: Secondary | ICD-10-CM | POA: Diagnosis not present

## 2020-04-28 DIAGNOSIS — H5213 Myopia, bilateral: Secondary | ICD-10-CM

## 2020-04-28 MED ORDER — AMLODIPINE BESYLATE 5 MG PO TABS: 5.0000 mg | ORAL_TABLET | Freq: Every day | ORAL | 2 refills | Status: DC

## 2020-04-28 MED ORDER — ATORVASTATIN CALCIUM 10 MG PO TABS: 10.0000 mg | ORAL_TABLET | Freq: Every day | ORAL | 3 refills | Status: DC

## 2020-04-28 MED ORDER — ALBUTEROL SULFATE HFA 108 (90 BASE) MCG/ACT IN AERS: 2.0000 | INHALATION_SPRAY | Freq: Four times a day (QID) | RESPIRATORY_TRACT | 2 refills | Status: DC | PRN

## 2020-04-28 MED ORDER — BUDESONIDE-FORMOTEROL FUMARATE 80-4.5 MCG/ACT IN AERO: 2.0000 | INHALATION_SPRAY | Freq: Two times a day (BID) | RESPIRATORY_TRACT | 2 refills | Status: DC

## 2020-04-28 MED ORDER — MELATONIN 3 MG PO TABS: ORAL_TABLET | ORAL | 2 refills | Status: DC

## 2020-04-28 MED FILL — SYMBICORT 80-4.5 MCG INH: 80-4.5 | 30 days supply | Qty: 10 | Fill #0

## 2020-04-28 MED FILL — ATORVASTATIN 10 MG TABLET: 10 | 90 days supply | Qty: 90 | Fill #0

## 2020-04-28 MED FILL — AMLODIPINE BESYLATE 5 MG TA: 5 | 90 days supply | Qty: 90 | Fill #0

## 2020-04-28 MED FILL — PROAIR HFA 90 MCG INHALER: 108 (90 BAS | 25 days supply | Qty: 9 | Fill #0

## 2020-04-28 NOTE — Progress Notes (Signed)
Subjective:   Jason Wong is a 65 y.o. male who presents for an Initial Medicare Annual Wellness Visit. Pt with hx of HTN, tob,seasonal allergies,asthma,centrilobular emphysema,insomnia, coronary atherosclerosis, pre-DMand hep Ctreated.  Review of Systems    BP elevated.  Reports he did not pick up his med in about 1 mth.  No CP, SOB, LE edema.  CODP: has not had inhalers in mth.  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  Does not drive. Sometimes when he walks he has to stop to catch his breath.  Still smoking, never got Chantix. Gets dizzy when he smokes so he is trying to quit.  Smokes 3 cigarettes QOD.  He has not had any further weight loss.  Reports good appetite. Still complains of chronic insomnia.  We had tried him with trazodone in the past but this caused enuresis for him.  Patient states he gets in bed around 10:00 at night and can sleep for a few hours but then is back up and has difficulty falling asleep again.   Reports falling about a week ago.  He states that he was pulling on something in his bedroom when he lost balance and fell backward on his buttock.  Denies any injuries.  No pain.    Objective:    Today's Vitals   04/28/20 0904  BP: (!) 166/104  Pulse: 72  Resp: 16  Temp: 97.6 F (36.4 C)  SpO2: 98%  Weight: 128 lb 3.2 oz (58.2 kg)  Height: 5\' 9"  (1.753 m)   Wt Readings from Last 3 Encounters:  04/28/20 128 lb 3.2 oz (58.2 kg)  09/06/19 118 lb 12.8 oz (53.9 kg)  04/22/19 125 lb (56.7 kg)    Body mass index is 18.93 kg/m.  Does not have a living will or health care power of attorney.  He was not sure what this meant so I did discuss with him.  At the end patient given a packet to take home with him.  Advised that if he does execute a living will or healthcare power of attorney, he should bring a copy for our records.  Current Medications  (verified) Outpatient Encounter Medications as of 04/28/2020  Medication Sig  . albuterol (PROAIR HFA) 108 (90 Base) MCG/ACT inhaler Inhale 2 puffs into the lungs every 6 (six) hours as needed for wheezing or shortness of breath.  Marland Kitchen amLODipine (NORVASC) 5 MG tablet Take 1 tablet (5 mg total) by mouth daily.  Marland Kitchen atorvastatin (LIPITOR) 10 MG tablet Take 1 tablet (10 mg total) by mouth daily.  . budesonide-formoterol (SYMBICORT) 80-4.5 MCG/ACT inhaler Inhale 2 puffs into the lungs 2 (two) times daily.  Marland Kitchen loratadine (CLARITIN) 10 MG tablet Take 1 tablet (10 mg total) by mouth daily.  . Melatonin 3 MG TABS Take one tab 1 hr prior to bedtime  . varenicline (CHANTIX STARTING MONTH PAK) 0.5 MG X 11 & 1 MG X 42 tablet Take one 0.5 mg tablet by mouth once daily for 3 days, then increase to one 0.5 mg tablet twice daily for 4 days, then increase to one 1 mg tablet twice daily.  . vitamin E 1000 UNIT capsule Take 1,000 Units by mouth daily.  . [DISCONTINUED] albuterol (PROAIR HFA) 108 (90 Base) MCG/ACT inhaler Inhale 2 puffs into the lungs every 6 (six) hours as needed for wheezing or shortness of breath.  . [  DISCONTINUED] amLODipine (NORVASC) 5 MG tablet Take 0.5 tablets (2.5 mg total) by mouth daily.  . [DISCONTINUED] atorvastatin (LIPITOR) 10 MG tablet Take 1 tablet (10 mg total) by mouth daily.  . [DISCONTINUED] budesonide-formoterol (SYMBICORT) 80-4.5 MCG/ACT inhaler Inhale 2 puffs into the lungs 2 (two) times daily.   No facility-administered encounter medications on file as of 04/28/2020.    Allergies (verified) Patient has no known allergies.   History: Past Medical History:  Diagnosis Date  . Asthma   . Hepatitis C antibody positive in blood   . HTN (hypertension)    Past Surgical History:  Procedure Laterality Date  . INCISE AND DRAIN ABCESS     dog bite   Family History  Problem Relation Age of Onset  . Hypertension Mother   . Cancer Sister   . Hypertension Sister   . Cancer Brother    . Colon cancer Neg Hx   . Colon polyps Neg Hx   . Esophageal cancer Neg Hx   . Rectal cancer Neg Hx   . Stomach cancer Neg Hx    Social History   Socioeconomic History  . Marital status: Single    Spouse name: Not on file  . Number of children: Not on file  . Years of education: Not on file  . Highest education level: Not on file  Occupational History  . Not on file  Tobacco Use  . Smoking status: Current Every Day Smoker    Packs/day: 0.50    Years: 45.00    Pack years: 22.50    Types: Cigarettes  . Smokeless tobacco: Never Used  Substance and Sexual Activity  . Alcohol use: Yes    Alcohol/week: 14.0 standard drinks    Types: 14 Cans of beer per week  . Drug use: Not Currently    Types: "Crack" cocaine  . Sexual activity: Yes    Partners: Female  Other Topics Concern  . Not on file  Social History Narrative  . Not on file   Social Determinants of Health   Financial Resource Strain: Not on file  Food Insecurity: Not on file  Transportation Needs: Not on file  Physical Activity: Not on file  Stress: Not on file  Social Connections: Not on file    Tobacco Counseling Ready to quit: Yes Counseling given: Yes Encouraged him to discontinue smoking.  Discussed health risks associated with smoking.  Patient has been cutting back and hopes to quit.  Encouraged him to set a quit date  Clinical Intake:  Pain : No/denies pain Diabetes: No  Activities of Daily Living In your present state of health, do you have any difficulty performing the following activities: 04/28/2020  Hearing? N  Vision? Y  Difficulty concentrating or making decisions? Y  Walking or climbing stairs? N  Dressing or bathing? N  Doing errands, shopping? N  Preparing Food and eating ? N  Using the Toilet? N  In the past six months, have you accidently leaked urine? N  Do you have problems with loss of bowel control? N  Managing your Medications? N  Managing your Finances? N  Housekeeping or  managing your Housekeeping? N  Some recent data might be hidden  Problems with distant vision.    Patient Care Team: Ladell Pier, MD as PCP - General (Internal Medicine) Dr. Harl Bowie - GI Indicate any recent Medical Services you may have received from other than [facility] providers in the past year (date may be approximate).  Assessment:   This is a routine wellness examination for Jason Wong.  Hearing/Vision screen Whisper test done and was normal.  Hearing Screening   125Hz  250Hz  500Hz  1000Hz  2000Hz  3000Hz  4000Hz  6000Hz  8000Hz   Right ear:           Left ear:             Visual Acuity Screening   Right eye Left eye Both eyes  Without correction: 20 25 20  70 20 30  With correction:       Dietary issues and exercise activities discussed: Patient reports good appetite. 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  Goals   None    Depression Screen PHQ 2/9 Scores 04/28/2020 09/06/2019 04/22/2019 01/03/2019 04/27/2018 12/25/2017 08/24/2017  PHQ - 2 Score 0 0 0 6 3 0 0  PHQ- 9 Score - - - 6 3 0 -    Fall Risk Fall Risk  04/28/2020 01/06/2020 09/06/2019 04/22/2019 01/03/2019  Falls in the past year? 1 0 0 0 0  Comment - - - no falls in the past 30 days -  Number falls in past yr: 0 0 - - -  Injury with Fall? 0 0 - - -  Follow up Education provided - - - -    FALL RISK PREVENTION PERTAINING TO THE HOME:  Any stairs in or around the home? Yes yes If so, are there any without handrails? Yes yes Home free of loose throw rugs in walkways, pet beds, electrical cords, etc? Yes  yes Adequate lighting in your home to reduce risk of falls? Yes   ASSISTIVE DEVICES UTILIZED TO PREVENT FALLS:  Life alert? No .  Patient does not feel that he needs 1. Use of a cane, walker or w/c? No  sometimes with cane Grab bars in the bathroom? Yes   Shower chair or bench in shower? No  Elevated toilet seat or a handicapped toilet? Yes   TIMED UP AND GO:  Was the test performed? Yes .   Length of time to ambulate 10 feet: 11 sec.   Gait slow and steady without use of assistive device  Cognitive Function: MMSE - Mini Mental State Exam 04/28/2020  Orientation to time 4  Orientation to Place 5  Registration 3  Attention/ Calculation 0  Recall 1  Language- name 2 objects 2  Language- repeat 1  Language- follow 3 step command 3  Language- read & follow direction 1  Write a sentence 1  Copy design 0  Total score 21  Patient reports that he does have problems with memory.  He gives instances where his sister would send him to the grocery store and then once he got there he forgot some of the items that he was supposed to get.  Sometimes he also has difficulty remembering the days of the week.  Denies any problems with getting lost, or forgetting to leave the stove or water on.      Immunizations Immunization History  Administered Date(s) Administered  . Hepatitis A, Adult 08/28/2017  . Hepatitis B, adult 08/28/2017, 10/03/2017  . Influenza,inj,Quad PF,6+ Mos 01/24/2017, 12/25/2017, 01/03/2019, 01/07/2020  . Pneumococcal Conjugate-13 04/28/2020  . Tdap 07/17/2015    TDAP status: Up to date  Flu Vaccine status: Up to date  Pneumococcal vaccine status: Completed during today's visit.  Covid-19 vaccine status: Information provided on how to obtain vaccines. We will have our Covid vaccine clinic call him to schedule appointment  Qualifies for Shingles Vaccine? Yes  Zostavax completed No   Shingrix Completed?: No.    Education has been provided regarding the importance of this vaccine. Patient has been advised to call insurance company to determine out of pocket expense if they have not yet received this vaccine. Advised may also receive vaccine at local pharmacy or Health Dept. Verbalized acceptance and understanding. Will plan to give on a subsequent visit.  Screening Tests Health Maintenance  Topic Date Due  . COVID-19 Vaccine (1) Never done  . COLONOSCOPY  (Pts 45-22yrs Insurance coverage will need to be confirmed)  10/26/2020  . PNA vac Low Risk Adult (2 of 2 - PPSV23) 04/28/2021  . TETANUS/TDAP  07/16/2025  . INFLUENZA VACCINE  Completed  . Hepatitis C Screening  Completed  . HIV Screening  Completed    Health Maintenance  Health Maintenance Due  Topic Date Due  . COVID-19 Vaccine (1) Never done    Colorectal cancer screening: Type of screening: Colonoscopy. Completed 10/26/2017. Repeat every 10 years  Lung Cancer Screening: (Low Dose CT Chest recommended if Age 56-80 years, 30 pack-year currently smoking OR have quit w/in 15years.) does qualify.   Lung Cancer Screening Referral: This has been completed already.  Additional Screening:  Hepatitis C Screening: does qualify; Completed 04/22/2019  Vision Screening: Recommended annual ophthalmology exams for early detection of glaucoma and other disorders of the eye. Is the patient up to date with their annual eye exam?  No  Who is the provider or what is the name of the office in which the patient attends annual eye exams?  N/A If pt is not established with a provider, would they like to be referred to a provider to establish care? Yes .   Dental Screening: Recommended annual dental exams for proper oral hygiene  Community Resource Referral / Chronic Care Management: CRR required this visit?  No   CCM required this visit?  No      Plan:    1. Encounter for Medicare annual wellness exam Discussed advanced directive with patient.  Patient wants to think about it and read about it more.  He was given a packet to take home. Patient scored 21 out of 30 on the Mini-Mental status exam which is concerning.  We can repeat this in about 6 months.  Advised patient to keep a list of items that he needs to get when he goes to the grocery store.  Also recommend keeping a calendar on his wall to keep him oriented to the day of the week, month and year -Plan to give shingles vaccine on follow-up  visit  2. Essential hypertension Not at goal.  Stressed the importance of good blood pressure control to prevent cardiovascular events.  I have increase Norvasc to 5 mg and have sent a 90-day supply on his medicines to the pharmacy so that he does not have to come to the pharmacy every month - amLODipine (NORVASC) 5 MG tablet; Take 1 tablet (5 mg total) by mouth daily.  Dispense: 90 tablet; Refill: 2  3. Tobacco dependence Advised to quit.  Health risks of smoking discussed.  Patient states he is actively trying to quit and has cut back.  Encouraged him to set a quit date.  Less than 5 minutes spent on counseling  4. Centrilobular emphysema ([place]) Encouraged him to pick up his prescriptions from the pharmacy and always keep albuterol inhaler with him in his pocket. - budesonide-formoterol (SYMBICORT) 80-4.5 MCG/ACT inhaler; Inhale 2 puffs into the lungs 2 (two) times daily.  Dispense: 10.2 g; Refill: 2 - albuterol (PROAIR HFA) 108 (90 Base) MCG/ACT inhaler; Inhale 2 puffs into the lungs every 6 (six) hours as needed for wheezing or shortness of breath.  Dispense: 8.5 g; Refill: 2  5. Pure hypercholesterolemia 90-day supply on Lipitor sent to his pharmacy - atorvastatin (LIPITOR) 10 MG tablet; Take 1 tablet (10 mg total) by mouth daily.  Dispense: 90 tablet; Refill: 3  6. Primary insomnia Good sleeping habits discussed and encouraged.  Will send prescription to his pharmacy for melatonin  7. Need for COVID-19 vaccine Message sent to her COVID vaccine clinic so that they can call him with an appointment to get him started  8. Need for vaccination against Streptococcus pneumoniae using pneumococcal conjugate vaccine 13 Given today  9. Nearsightedness, bilateral - Ambulatory referral to Ophthalmology  10. Memory deficit See #1 above.  I have personally reviewed and noted the following in the patient's chart:   . Medical and social history . Use of alcohol, tobacco or illicit drugs   . Current medications and supplements . Functional ability and status . Nutritional status . Physical activity . Advanced directives . List of other physicians . Hospitalizations, surgeries, and ER visits in previous 12 months . Vitals . Screenings to include cognitive, depression, and falls . Referrals and appointments  In addition, I have reviewed and discussed with patient certain preventive protocols, quality metrics, and best practice recommendations. A written personalized care plan for preventive services as well as general preventive health recommendations were provided to patient.     Karle Plumber, MD   04/28/2020

## 2020-04-28 NOTE — Addendum Note (Signed)
Addended by: Karle Plumber B on: 04/28/2020 01:23 PM   Modules accepted: Orders

## 2020-04-28 NOTE — Patient Instructions (Addendum)
Pneumococcal Conjugate Vaccine (PCV13): What You Need to Know 1. Why get vaccinated? Pneumococcal conjugate vaccine (PCV13) can prevent pneumococcal disease. Pneumococcal disease refers to any illness caused by pneumococcal bacteria. These bacteria can cause many types of illnesses, including pneumonia, which is an infection of the lungs. Pneumococcal bacteria are one of the most common causes of pneumonia. Besides pneumonia, pneumococcal bacteria can also cause:  Ear infections  Sinus infections  Meningitis (infection of the tissue covering the brain and spinal cord)  Bacteremia (infection of the blood) Anyone can get pneumococcal disease, but children under 2 years old, people with certain medical conditions, adults 65 years or older, and cigarette smokers are at the highest risk. Most pneumococcal infections are mild. However, some can result in long-term problems, such as brain damage or hearing loss. Meningitis, bacteremia, and pneumonia caused by pneumococcal disease can be fatal. 2. PCV13 PCV13 protects against 13 types of bacteria that cause pneumococcal disease. Infants and young children usually need 4 doses of pneumococcal conjugate vaccine, at ages 2, 4, 6, and 12-15 months. Older children (through age 59 months) may be vaccinated if they did not receive the recommended doses. A dose of PCV13 is also recommended for adults and children 6 years or older with certain medical conditions if they did not already receive PCV13. This vaccine may be given to healthy adults 65 years or older who did not already receive PCV13, based on discussions between the patient and health care provider. 3. Talk with your health care provider Tell your vaccination provider if the person getting the vaccine:  Has had an allergic reaction after a previous dose of PCV13, to an earlier pneumococcal conjugate vaccine known as PCV7, or to any vaccine containing diphtheria toxoid (for example, DTaP), or has  any severe, life-threatening allergies In some cases, your health care provider may decide to postpone PCV13 vaccination until a future visit. People with minor illnesses, such as a cold, may be vaccinated. People who are moderately or severely ill should usually wait until they recover before getting PCV13. Your health care provider can give you more information. 4. Risks of a vaccine reaction  Redness, swelling, pain, or tenderness where the shot is given, and fever, loss of appetite, fussiness (irritability), feeling tired, headache, and chills can happen after PCV13 vaccination. Young children may be at increased risk for seizures caused by fever after PCV13 if it is administered at the same time as inactivated influenza vaccine. Ask your health care provider for more information. People sometimes faint after medical procedures, including vaccination. Tell your provider if you feel dizzy or have vision changes or ringing in the ears. As with any medicine, there is a very remote chance of a vaccine causing a severe allergic reaction, other serious injury, or death. 5. What if there is a serious problem? An allergic reaction could occur after the vaccinated person leaves the clinic. If you see signs of a severe allergic reaction (hives, swelling of the face and throat, difficulty breathing, a fast heartbeat, dizziness, or weakness), call 9-1-1 and get the person to the nearest hospital. For other signs that concern you, call your health care provider. Adverse reactions should be reported to the Vaccine Adverse Event Reporting System (VAERS). Your health care provider will usually file this report, or you can do it yourself. Visit the VAERS website at www.vaers.hhs.gov or call 1-800-822-7967. VAERS is only for reporting reactions, and VAERS staff members do not give medical advice. 6. The National Vaccine Injury Compensation Program   The [employer] Injury Compensation Program (VICP) is a federal  program that was created to compensate people who may have been injured by certain vaccines. Claims regarding alleged injury or death due to vaccination have a time limit for filing, which may be as short as two years. Visit the VICP website at GoldCloset.com.ee or call 9794337358 to learn about the program and about filing a claim. 7. How can I learn more?  Ask your health care provider.  Call your local or state health department.  Visit the website of the Food and Drug Administration (FDA) for vaccine package inserts and additional information at TraderRating.uy.  Contact the Centers for Disease Control and Prevention (CDC): ? Call 403-780-1779 (1-800-CDC-INFO) or ? Visit CDC's website at http://hunter.com/. Vaccine Information Statement PCV13 (10/25/2019) This information is not intended to replace advice given to you by your health care provider. Make sure you discuss any questions you have with your health care provider. Document Revised: 12/12/2019 Document Reviewed: 12/12/2019 Elsevier Patient Education  2021 [place] Prevention in the Home, Adult Falls can cause injuries and can happen to people of all ages. There are many things you can do to make your home safe and to help prevent falls. Ask for help when making these changes. What actions can I take to prevent falls? General Instructions  Use good lighting in all rooms. Replace any light bulbs that burn out.  Turn on the lights in dark areas. Use night-lights.  Keep items that you use often in easy-to-reach places. Lower the shelves around your home if needed.  Set up your furniture so you have a clear path. Avoid moving your furniture around.  Do not have throw rugs or other things on the floor that can make you trip.  Avoid walking on wet floors.  If any of your floors are uneven, fix them.  Add color or contrast paint or tape to clearly mark and help  you see: ? Grab bars or handrails. ? First and last steps of staircases. ? Where the edge of each step is.  If you use a stepladder: ? Make sure that it is fully opened. Do not climb a closed stepladder. ? Make sure the sides of the stepladder are locked in place. ? Ask someone to hold the stepladder while you use it.  Know where your pets are when moving through your home. What can I do in the bathroom?  Keep the floor dry. Clean up any water on the floor right away.  Remove soap buildup in the tub or shower.  Use nonskid mats or decals on the floor of the tub or shower.  Attach bath mats securely with double-sided, nonslip rug tape.  If you need to sit down in the shower, use a plastic, nonslip stool.  Install grab bars by the toilet and in the tub and shower. Do not use towel bars as grab bars.      What can I do in the bedroom?  Make sure that you have a light by your bed that is easy to reach.  Do not use any sheets or blankets for your bed that hang to the floor.  Have a firm chair with side arms that you can use for support when you get dressed. What can I do in the kitchen?  Clean up any spills right away.  If you need to reach something above you, use a step stool with a grab bar.  Keep electrical cords out  of the way.  Do not use floor polish or wax that makes floors slippery. What can I do with my stairs?  Do not leave any items on the stairs.  Make sure that you have a light switch at the top and the bottom of the stairs.  Make sure that there are handrails on both sides of the stairs. Fix handrails that are broken or loose.  Install nonslip stair treads on all your stairs.  Avoid having throw rugs at the top or bottom of the stairs.  Choose a carpet that does not hide the edge of the steps on the stairs.  Check carpeting to make sure that it is firmly attached to the stairs. Fix carpet that is loose or worn. What can I do on the outside of my  home?  Use bright outdoor lighting.  Fix the edges of walkways and driveways and fix any cracks.  Remove anything that might make you trip as you walk through a door, such as a raised step or threshold.  Trim any bushes or trees on paths to your home.  Check to see if handrails are loose or broken and that both sides of all steps have handrails.  Install guardrails along the edges of any raised decks and porches.  Clear paths of anything that can make you trip, such as tools or rocks.  Have leaves, snow, or ice cleared regularly.  Use sand or salt on paths during winter.  Clean up any spills in your garage right away. This includes grease or oil spills. What other actions can I take?  Wear shoes that: ? Have a low heel. Do not wear high heels. ? Have rubber bottoms. ? Feel good on your feet and fit well. ? Are closed at the toe. Do not wear open-toe sandals.  Use tools that help you move around if needed. These include: ? Canes. ? Walkers. ? Scooters. ? Crutches.  Review your medicines with your doctor. Some medicines can make you feel dizzy. This can increase your chance of falling. Ask your doctor what else you can do to help prevent falls. Where to find more information  Centers for Disease Control and Prevention, STEADI: http://www.wolf.info/  National Institute on Aging: http://kim-miller.com/ Contact a doctor if:  You are afraid of falling at home.  You feel weak, drowsy, or dizzy at home.  You fall at home. Summary  There are many simple things that you can do to make your home safe and to help prevent falls.  Ways to make your home safe include removing things that can make you trip and installing grab bars in the bathroom.  Ask for help when making these changes in your home. This information is not intended to replace advice given to you by your health care provider. Make sure you discuss any questions you have with your health care provider. Document Revised:  10/09/2019 Document Reviewed: 10/09/2019 Elsevier Patient Education  [place].  Memory Compensation Strategies  8. Use "WARM" strategy.  W= write it down  A= associate it  R= repeat it  M= make a mental note  2.   You can keep a Social worker.  Use a 3-ring notebook with sections for the following: calendar, important names and phone numbers,  medications, doctors' names/phone numbers, lists/reminders, and a section to journal what you did  each day.   3.    Use a calendar to write appointments down.  4.    Write yourself a schedule  for the day.  This can be placed on the calendar or in a separate section of the Memory Notebook.  Keeping a  regular schedule can help memory.  5.    Use medication organizer with sections for each day or morning/evening pills.  You may need help loading it  6.    Keep a basket, or pegboard by the door.  Place items that you need to take out with you in the basket or on the pegboard.  You may also want to  include a message board for reminders.  7.    Use sticky notes.  Place sticky notes with reminders in a place where the task is performed.  For example: " turn off the  stove" placed by the stove, "lock the door" placed on the door at eye level, " take your medications" on  the bathroom mirror or by the place where you normally take your medications.  8.    Use alarms/timers.  Use while cooking to remind yourself to check on food or as a reminder to take your medicine, or as a  reminder to make a call, or as a reminder to perform another task, etc.

## 2020-05-12 ENCOUNTER — Other Ambulatory Visit: Payer: Self-pay

## 2020-05-12 ENCOUNTER — Ambulatory Visit: Payer: Medicare Other | Attending: Internal Medicine | Admitting: Pharmacist

## 2020-05-12 VITALS — BP 138/89 | HR 74

## 2020-05-12 DIAGNOSIS — I10 Essential (primary) hypertension: Secondary | ICD-10-CM

## 2020-05-12 MED FILL — AMLODIPINE BESYLATE 5 MG TA: 5 | 90 days supply | Qty: 90 | Fill #0

## 2020-05-12 MED FILL — ATORVASTATIN 10 MG TABLET: 10 | 90 days supply | Qty: 90 | Fill #0

## 2020-05-12 NOTE — Progress Notes (Unsigned)
S:   PCP: Dr. Wynetta Emery    Patient arrives well and in good spirits. Presents to the clinic for hypertension evaluation, counseling, and management. Patient was referred and last seen by Primary Care Provider, Dr. Wynetta Emery, on 04/28/2020 for a medicare annual wellness visit. At this visit, BP was elevated at 166/104 mmHg. Amlodipine was increased from 2.5 mg to 5 mg. Of note, patient reported memory loss and scored 21 out of 30 on the Mini-Mental status exam.   Today, patient advised that he has been out of all his medications for "several months". He does not have a blood pressure cuff at home. Reports occasional headaches and dizziness (particularly after smoking). Discussed memory loss issues, patient reports issues remembering when to take medications and/or if he has already taken his medications. States he could benefit from a calendar and medication list.   Medication adherence denied.  Current BP Medications include:  Amlodipine 5 mg (not taking - has been out for several months)  Antihypertensives tried in the past include: none to note  Dietary habits include: denies coffee, soda intake  Exercise habits include: none reported Family / Social history: current smoker   O:  Vitals:   05/12/20 1150  BP: 138/89  Pulse: 74   Home BP readings: does not have BP cuff at home   Last 3 Office BP readings: BP Readings from Last 3 Encounters:  04/28/20 (!) 166/104  09/06/19 (!) 150/90  04/22/19 (!) 145/90    BMET    Component Value Date/Time   NA 144 01/07/2020 0934   K 4.1 01/07/2020 0934   CL 101 01/07/2020 0934   CO2 25 01/07/2020 0934   GLUCOSE 109 (H) 01/07/2020 0934   GLUCOSE 58 (L) 10/03/2017 1037   BUN 14 01/07/2020 0934   CREATININE 1.31 (H) 01/07/2020 0934   CREATININE 1.18 10/03/2017 1037   CALCIUM 10.0 01/07/2020 0934   GFRNONAA 57 (L) 01/07/2020 0934   GFRAA 66 01/07/2020 0934    Renal function: CrCl cannot be calculated (Patient's most recent lab result is  older than the maximum 21 days allowed.).  Clinical ASCVD: No  The 10-year ASCVD risk score Mikey Bussing DC Jr., et al., 2013) is: 34.9%   Values used to calculate the score:     Age: 65 years     Sex: Male     Is Non-Hispanic African American: Yes     Diabetic: No     Tobacco smoker: Yes     Systolic Blood Pressure: 798 mmHg     Is BP treated: Yes     HDL Cholesterol: 70 mg/dL     Total Cholesterol: 171 mg/dL    A/P: Hypertension diagnosed, currently uncontrolled due to medication adherence/access. BP Goal = < 130/80 mmHg. Medication adherence denied. Restarting amlodipine today, I have went ahead and initiated the fill at [facility]. Confirmed the patient has now picked up his medications. I also made the patient a medication chart, indicating prescribed medications and directions. Printed out a February and March calender and advised him to mark out the day once he takes his medications each day - this will help him remember if he has taken his medications yet.  -Restarted amlodipine 5 mg once daily.  -Counseled on lifestyle modifications for blood pressure control including reduced dietary sodium, increased exercise, adequate sleep.  Results reviewed and written information provided.   Total time in face-to-face counseling 15 minutes.   F/U Clinic Visit with Pharmacist in 1 month.  Patient seen  with Hailey.  Harriet Pho, PharmD PGY-1 [facility] Pharmacy Resident   05/12/2020 11:49 AM

## 2020-05-13 ENCOUNTER — Encounter: Payer: Self-pay | Admitting: Pharmacist

## 2020-06-09 ENCOUNTER — Ambulatory Visit: Payer: Medicare Other | Attending: Internal Medicine | Admitting: Pharmacist

## 2020-06-09 ENCOUNTER — Other Ambulatory Visit: Payer: Self-pay

## 2020-06-09 ENCOUNTER — Other Ambulatory Visit: Payer: Self-pay | Admitting: Pharmacist

## 2020-06-09 ENCOUNTER — Encounter: Payer: Self-pay | Admitting: Pharmacist

## 2020-06-09 VITALS — BP 127/83 | HR 70

## 2020-06-09 DIAGNOSIS — F5101 Primary insomnia: Secondary | ICD-10-CM

## 2020-06-09 DIAGNOSIS — I10 Essential (primary) hypertension: Secondary | ICD-10-CM | POA: Diagnosis not present

## 2020-06-09 MED ORDER — MELATONIN 3 MG PO TABS: ORAL_TABLET | ORAL | 2 refills | Status: DC

## 2020-06-09 NOTE — Progress Notes (Signed)
   S:   PCP: Dr. Wynetta Emery    Patient arrives well and in good spirits. Presents to the clinic for hypertension evaluation, counseling, and management. Patient was referred and last seen by Primary Care Provider, Dr. Wynetta Emery, on 04/28/2020 for a medicare annual wellness visit. Of note, patient reported memory loss and scored 21 out of 30 on his MMSE. Seen by clinical pharmacist on 05/12/2020; adherence was denied and amlodipine 5 mg was restarted. Medication adherence tools were provided.   Today, patient reports excellent adherence to his blood pressure medication. Has been utilizing the medication calendars we provided him at last pharmacy visit - he feels this is helpful. Does not have BP cuff at home.   Medication adherence reported.  Current BP Medications include:  Amlodipine 5 mg daily  Antihypertensives tried in the past include: none to note  Dietary habits include: denies coffee, soda intake  Exercise habits include: none reported Family / Social history: current smoker   O:  Vitals:   06/09/20 1121  BP: 127/83  Pulse: 70   Home BP readings: does not have BP cuff at home   Last 3 Office BP readings: BP Readings from Last 3 Encounters:  06/09/20 127/83  05/12/20 138/89  04/28/20 (!) 166/104   BMET    Component Value Date/Time   NA 144 01/07/2020 0934   K 4.1 01/07/2020 0934   CL 101 01/07/2020 0934   CO2 25 01/07/2020 0934   GLUCOSE 109 (H) 01/07/2020 0934   GLUCOSE 58 (L) 10/03/2017 1037   BUN 14 01/07/2020 0934   CREATININE 1.31 (H) 01/07/2020 0934   CREATININE 1.18 10/03/2017 1037   CALCIUM 10.0 01/07/2020 0934   GFRNONAA 57 (L) 01/07/2020 0934   GFRAA 66 01/07/2020 0934   Renal function: CrCl cannot be calculated (Patient's most recent lab result is older than the maximum 21 days allowed.).  Clinical ASCVD: No  The 10-year ASCVD risk score Mikey Bussing DC Jr., et al., 2013) is: 22.7%   Values used to calculate the score:     Age: 50 years     Sex: Male     Is  Non-Hispanic African American: Yes     Diabetic: No     Tobacco smoker: Yes     Systolic Blood Pressure: 458 mmHg     Is BP treated: Yes     HDL Cholesterol: 70 mg/dL     Total Cholesterol: 171 mg/dL   A/P: Hypertension longstanding, now controlled on amlodipine 5 mg. BP Goal = < 130/80 mmHg. Medication adherence appropriate. Printed out medication calendars for April and June. We will continue to follow up with him monthly until seen by Dr. Wynetta Emery in June.  -Continued amlodipine 5 mg once daily.  -Counseled on lifestyle modifications for blood pressure control including reduced dietary sodium, increased exercise, adequate sleep.  Of note, patient has been out of Symbicort and Albuterol for several months - due to pricing (copays ~$350). Claiborne Billings provided him with a patient assistance application. He said he would take it home and complete it and bring it back. Please follow up on this at next visit.   Results reviewed and written information provided.   Total time in face-to-face counseling 15 minutes.   F/U Clinic Visit with Pharmacist in 1 month.  Patient seen with Neshawn Aird.  Harriet Pho, PharmD PGY-1 [facility] Pharmacy Resident   06/09/2020 1:45 PM

## 2020-07-10 ENCOUNTER — Other Ambulatory Visit: Payer: Self-pay

## 2020-07-10 ENCOUNTER — Encounter: Payer: Self-pay | Admitting: Pharmacist

## 2020-07-10 ENCOUNTER — Ambulatory Visit: Payer: Medicare Other | Attending: Internal Medicine | Admitting: Pharmacist

## 2020-07-10 VITALS — BP 107/76 | HR 77

## 2020-07-10 DIAGNOSIS — I10 Essential (primary) hypertension: Secondary | ICD-10-CM | POA: Diagnosis not present

## 2020-07-10 NOTE — Progress Notes (Signed)
   S:   PCP: Dr. Wynetta Emery    Patient arrives well and in good spirits. Presents to the clinic for hypertension evaluation, counseling, and management. Patient was referred and last seen by Primary Care Provider, Dr. Wynetta Emery, on 04/28/2020 for a medicare annual wellness visit. Seen by clinical pharmacist on 06/09/2020. Adherence was improved and patient's blood pressure was at goal.    Today, patient reports excellent adherence to his blood pressure medication. Has been utilizing the medication calendars we provided him at last pharmacy visit - he feels this is helpful. Does not have BP cuff at home.   Medication adherence reported.  Current BP Medications include:  Amlodipine 5 mg daily  Antihypertensives tried in the past include: none to note  Dietary habits include: denies coffee, soda intake  Exercise habits include: none reported Family / Social history: current smoker  O:  Vitals:   07/10/20 0954  BP: 107/76  Pulse: 77    Home BP readings: does not have BP cuff at home   Last 3 Office BP readings: BP Readings from Last 3 Encounters:  07/10/20 107/76  06/09/20 127/83  05/12/20 138/89   BMET    Component Value Date/Time   NA 144 01/07/2020 0934   K 4.1 01/07/2020 0934   CL 101 01/07/2020 0934   CO2 25 01/07/2020 0934   GLUCOSE 109 (H) 01/07/2020 0934   GLUCOSE 58 (L) 10/03/2017 1037   BUN 14 01/07/2020 0934   CREATININE 1.31 (H) 01/07/2020 0934   CREATININE 1.18 10/03/2017 1037   CALCIUM 10.0 01/07/2020 0934   GFRNONAA 57 (L) 01/07/2020 0934   GFRAA 66 01/07/2020 0934   Renal function: CrCl cannot be calculated (Patient's most recent lab result is older than the maximum 21 days allowed.).  Clinical ASCVD: No  The 10-year ASCVD risk score Mikey Bussing DC Jr., et al., 2013) is: 16.9%   Values used to calculate the score:     Age: 46 years     Sex: Male     Is Non-Hispanic African American: Yes     Diabetic: No     Tobacco smoker: Yes     Systolic Blood Pressure: 119  mmHg     Is BP treated: Yes     HDL Cholesterol: 70 mg/dL     Total Cholesterol: 171 mg/dL   A/P: Hypertension longstanding, now controlled on amlodipine 5 mg. BP Goal = < 130/80 mmHg. Medication adherence appropriate. -Continued amlodipine 5 mg once daily.  -Counseled on lifestyle modifications for blood pressure control including reduced dietary sodium, increased exercise, adequate sleep.  Results reviewed and written information provided.   Total time in face-to-face counseling 15 minutes.   F/U Clinic Visit with Pharmacist in 1 month.  Benard Halsted, PharmD, [place], [place] [phone]

## 2020-08-18 NOTE — Progress Notes (Unsigned)
   S:     PCP: Dr. Wynetta Emery  Patient arrives well and in good spirits. Presents to the clinic for hypertension evaluation, counseling, and management. Patient was referred and last seen by Primary Care Provider, Dr. Wynetta Emery, on 04/28/2020 for a medicare annual wellness visit. Pt last seen by pharmacy on 07/10/20. BP 107/76 was at goal with no medication changes.  Today, patient reports ***  Compliance? Took meds this morning? When do you take your meds? Dizziness, headaches, blurred vision? History of swelling? Check Clinic BP? Home BP logs? If no logs, bring to next visit w/ BP cuff Go over BP goals Additional BP therapy if needed . Increase amlodipine Diet??  Exercise??    Medication adherence *** .  Current BP Medications include:  Amlodipine 5 mg daily  Antihypertensives tried in the past include: none to note  Dietary habits include: denies coffee, soda intake  Exercise habits include: none reported Family / Social history: current smoker  O:   Home BP readings: ***  Last 3 Office BP readings: BP Readings from Last 3 Encounters:  07/10/20 107/76  06/09/20 127/83  05/12/20 138/89    BMET    Component Value Date/Time   NA 144 01/07/2020 0934   K 4.1 01/07/2020 0934   CL 101 01/07/2020 0934   CO2 25 01/07/2020 0934   GLUCOSE 109 (H) 01/07/2020 0934   GLUCOSE 58 (L) 10/03/2017 1037   BUN 14 01/07/2020 0934   CREATININE 1.31 (H) 01/07/2020 0934   CREATININE 1.18 10/03/2017 1037   CALCIUM 10.0 01/07/2020 0934   GFRNONAA 57 (L) 01/07/2020 0934   GFRAA 66 01/07/2020 0934    Renal function: CrCl cannot be calculated (Patient's most recent lab result is older than the maximum 21 days allowed.).  Clinical ASCVD: No  The 10-year ASCVD risk score Mikey Bussing DC Jr., et al., 2013) is: 16.9%   Values used to calculate the score:     Age: 65 years     Sex: Male     Is Non-Hispanic African American: Yes     Diabetic: No     Tobacco smoker: Yes     Systolic Blood  Pressure: 107 mmHg     Is BP treated: Yes     HDL Cholesterol: 70 mg/dL     Total Cholesterol: 171 mg/dL   A/P: Hypertension diagnosed longstanding currently *** on current medications. BP Goal = <130/80 mmHg. Medication adherence ***.  -{Meds adjust:18428} ***.  -Counseled on lifestyle modifications for blood pressure control including reduced dietary sodium, increased exercise, adequate sleep.  Results reviewed and written information provided.   Total time in face-to-face counseling *** minutes.   F/U Clinic Visit in ***.   Lorel Monaco, PharmD, [place] PGY2 Ambulatory Care Resident [place]

## 2020-08-19 ENCOUNTER — Telehealth: Payer: Self-pay | Admitting: Internal Medicine

## 2020-08-19 ENCOUNTER — Ambulatory Visit: Payer: Medicare Other | Admitting: Pharmacist

## 2020-08-19 NOTE — Telephone Encounter (Signed)
Pt has refills at the pharmacy. Pt will need to contact pharmacy

## 2020-08-19 NOTE — Telephone Encounter (Signed)
Pt would like a refill on these medications: atorvastatin (LIPITOR) 10 MG tablet  amLODipine (NORVASC) 5 MG tablet [[id number]. Pt states that he is running low on his blood pressure pills.  Please follow up with pt regarding this matter.

## 2020-08-20 ENCOUNTER — Emergency Department (HOSPITAL_COMMUNITY): Payer: Medicare Other

## 2020-08-20 ENCOUNTER — Encounter (HOSPITAL_COMMUNITY): Payer: Self-pay

## 2020-08-20 ENCOUNTER — Other Ambulatory Visit: Payer: Self-pay

## 2020-08-20 ENCOUNTER — Emergency Department (HOSPITAL_COMMUNITY)
Admission: EM | Admit: 2020-08-20 | Discharge: 2020-08-21 | Disposition: A | Discharge status: Home / Self Care | Payer: Medicare Other | Attending: Emergency Medicine | Admitting: Emergency Medicine

## 2020-08-20 DIAGNOSIS — Z79899 Other long term (current) drug therapy: Secondary | ICD-10-CM | POA: Insufficient documentation

## 2020-08-20 DIAGNOSIS — T402X1A Poisoning by other opioids, accidental (unintentional), initial encounter: Secondary | ICD-10-CM | POA: Insufficient documentation

## 2020-08-20 DIAGNOSIS — F17210 Nicotine dependence, cigarettes, uncomplicated: Secondary | ICD-10-CM | POA: Diagnosis not present

## 2020-08-20 DIAGNOSIS — J45909 Unspecified asthma, uncomplicated: Secondary | ICD-10-CM | POA: Diagnosis not present

## 2020-08-20 DIAGNOSIS — R55 Syncope and collapse: Secondary | ICD-10-CM | POA: Diagnosis not present

## 2020-08-20 DIAGNOSIS — T50904A Poisoning by unspecified drugs, medicaments and biological substances, undetermined, initial encounter: Secondary | ICD-10-CM | POA: Diagnosis not present

## 2020-08-20 DIAGNOSIS — R4020 Unspecified coma: Secondary | ICD-10-CM | POA: Diagnosis not present

## 2020-08-20 DIAGNOSIS — T50901A Poisoning by unspecified drugs, medicaments and biological substances, accidental (unintentional), initial encounter: Secondary | ICD-10-CM | POA: Diagnosis present

## 2020-08-20 DIAGNOSIS — R5383 Other fatigue: Secondary | ICD-10-CM | POA: Insufficient documentation

## 2020-08-20 DIAGNOSIS — Z7951 Long term (current) use of inhaled steroids: Secondary | ICD-10-CM | POA: Insufficient documentation

## 2020-08-20 DIAGNOSIS — R6889 Other general symptoms and signs: Secondary | ICD-10-CM | POA: Diagnosis not present

## 2020-08-20 DIAGNOSIS — X58XXXA Exposure to other specified factors, initial encounter: Secondary | ICD-10-CM | POA: Insufficient documentation

## 2020-08-20 DIAGNOSIS — R404 Transient alteration of awareness: Secondary | ICD-10-CM | POA: Diagnosis not present

## 2020-08-20 DIAGNOSIS — Z743 Need for continuous supervision: Secondary | ICD-10-CM | POA: Diagnosis not present

## 2020-08-20 DIAGNOSIS — I10 Essential (primary) hypertension: Secondary | ICD-10-CM | POA: Insufficient documentation

## 2020-08-20 LAB — COMPREHENSIVE METABOLIC PANEL
ALT: 14 U/L (ref 0–44)
AST: 23 U/L (ref 15–41)
Albumin: 4.4 g/dL (ref 3.5–5.0)
Alkaline Phosphatase: 77 U/L (ref 38–126)
Anion gap: 14 (ref 5–15)
BUN: 14 mg/dL (ref 8–23)
CO2: 19 mmol/L — ABNORMAL LOW (ref 22–32)
Calcium: 9.2 mg/dL (ref 8.9–10.3)
Chloride: 106 mmol/L (ref 98–111)
Creatinine, Ser: 1.26 mg/dL — ABNORMAL HIGH (ref 0.61–1.24)
GFR, Estimated: >60 mL/min (ref >60)
Glucose, Bld: 166 mg/dL — ABNORMAL HIGH (ref 70–99)
Potassium: 3 mmol/L — ABNORMAL LOW (ref 3.5–5.1)
Sodium: 139 mmol/L (ref 135–145)
Total Bilirubin: 0.8 mg/dL (ref 0.3–1.2)
Total Protein: 7.8 g/dL (ref 6.5–8.1)

## 2020-08-20 LAB — I-STAT VENOUS BLOOD GAS, ED
Acid-base deficit: 7 mmol/L — ABNORMAL HIGH (ref 0.0–2.0)
Bicarbonate: 19.3 mmol/L — ABNORMAL LOW (ref 20.0–28.0)
Calcium, Ion: 1.17 mmol/L (ref 1.15–1.40)
HCT: 42 % (ref 39.0–52.0)
Hemoglobin: 14.3 g/dL (ref 13.0–17.0)
O2 Saturation: 93 %
Potassium: 3 mmol/L — ABNORMAL LOW (ref 3.5–5.1)
Sodium: 143 mmol/L (ref 135–145)
TCO2: 21 mmol/L — ABNORMAL LOW (ref 22–32)
pCO2, Ven: 42.9 mm[Hg] — ABNORMAL LOW (ref 44.0–60.0)
pH, Ven: 7.262 (ref 7.250–7.430)
pO2, Ven: 78 mm[Hg] — ABNORMAL HIGH (ref 32.0–45.0)

## 2020-08-20 LAB — ETHANOL: Alcohol, Ethyl (B): 101 mg/dL — ABNORMAL HIGH (ref <10)

## 2020-08-20 LAB — CBC
HCT: 41.5 % (ref 39.0–52.0)
Hemoglobin: 13.7 g/dL (ref 13.0–17.0)
MCH: 33.3 pg (ref 26.0–34.0)
MCHC: 33 g/dL (ref 30.0–36.0)
MCV: 100.7 fL — ABNORMAL HIGH (ref 80.0–100.0)
Platelets: 268 10*3/uL (ref 150–400)
RBC: 4.12 MIL/uL — ABNORMAL LOW (ref 4.22–5.81)
RDW: 13.2 % (ref 11.5–15.5)
WBC: 9.2 10*3/uL (ref 4.0–10.5)
nRBC: 0 % (ref 0.0–0.2)

## 2020-08-20 LAB — AMMONIA: Ammonia: 35 umol/L (ref 9–35)

## 2020-08-20 LAB — CBG MONITORING, ED: Glucose-Capillary: 129 mg/dL — ABNORMAL HIGH (ref 70–99)

## 2020-08-20 LAB — LACTIC ACID, PLASMA: Lactic Acid, Venous: 3.4 mmol/L (ref 0.5–1.9)

## 2020-08-20 MED ORDER — POTASSIUM CHLORIDE 20 MEQ PO PACK
40.0000 meq | PACK | Freq: Once | ORAL | Status: AC
  Administered 2020-08-21: 40 meq via ORAL
  Filled 2020-08-20: qty 2

## 2020-08-20 MED ORDER — ONDANSETRON HCL 4 MG/2ML IJ SOLN: 4.0000 mg | Freq: Once | INTRAMUSCULAR | Status: AC

## 2020-08-20 MED ORDER — NALOXONE HCL 2 MG/2ML IJ SOSY: 1.0000 mg | PREFILLED_SYRINGE | Freq: Once | INTRAMUSCULAR | Status: AC

## 2020-08-20 MED ORDER — LACTATED RINGERS IV BOLUS
1000.0000 mL | Freq: Once | INTRAVENOUS | Status: AC
  Administered 2020-08-20: 1000 mL via INTRAVENOUS

## 2020-08-20 MED ORDER — NALOXONE HCL 0.4 MG/ML IJ SOLN
INTRAMUSCULAR | Status: AC
  Filled 2020-08-20: qty 1

## 2020-08-20 MED ORDER — NALOXONE HCL 2 MG/2ML IJ SOSY
PREFILLED_SYRINGE | INTRAMUSCULAR | Status: AC
  Administered 2020-08-20: 1 mg via INTRAVENOUS
  Filled 2020-08-20: qty 2

## 2020-08-20 MED ORDER — NALOXONE HCL 0.4 MG/ML IJ SOLN
0.4000 mg | Freq: Once | INTRAMUSCULAR | Status: AC
  Administered 2020-08-20: 0.4 mg via INTRAVENOUS

## 2020-08-20 MED ORDER — ONDANSETRON HCL 4 MG/2ML IJ SOLN
INTRAMUSCULAR | Status: AC
  Administered 2020-08-20: 4 mg via INTRAVENOUS
  Filled 2020-08-20: qty 2

## 2020-08-20 NOTE — ED Notes (Signed)
Pt uncooperative. Pt states, " Leave me alone, I'm tired."

## 2020-08-20 NOTE — ED Notes (Signed)
Pt actively vomited.

## 2020-08-20 NOTE — ED Notes (Signed)
Pt unable to verbally respond on arrival. After given 1.4mg  Narcan by IVP, pt answering questions appropriately.

## 2020-08-20 NOTE — ED Triage Notes (Signed)
EMS reports pt is from home. Was found to be unresponsive by family. Normally A&O and verbal. Pt became responsive to verbal stimuli after Narcan 2mg  given by IVP. History of ETOH and crack use.

## 2020-08-20 NOTE — ED Provider Notes (Signed)
[facility] EMERGENCY DEPARTMENT Provider Note   CSN: [id number] Arrival date & time: 08/20/20  2056     History Chief Complaint  Patient presents with  . Altered Mental Status    Jason Wong is a 65 y.o. male.  The history is provided by the patient and the EMS personnel. The history is limited by the condition of the patient.  Altered Mental Status Presenting symptoms: lethargy and unresponsiveness   Severity:  Severe Most recent episode:  Today Episode history:  Single Duration:  2 hours Timing:  Constant Progression:  Improving Chronicity:  New Context: alcohol use and drug use   Context: not dementia, not head injury, not homeless, taking medications as prescribed, not nursing home resident, not recent change in medication, not recent illness and not recent infection    Patient was found down by roommates at his apartment. They said that he smokes crack and had been smoking crack today. Fire department found him with agonal respirations and not responsive to painful stimuli - they gave 2 IN narcan on arrival. When EMS arrived he had a GCS of 7, was breathing better but hypoxic in the high 80s.  En route, they gave him another 0.5 IN narcan for worsening mental status and responsiveness.     Past Medical History:  Diagnosis Date  . Asthma   . Hepatitis C antibody positive in blood   . HTN (hypertension)     Patient Active Problem List   Diagnosis Date Noted  . Memory deficit 04/28/2020  . Hepatitis C virus infection cured after antiviral drug therapy 04/27/2018  . Enuresis 04/27/2018  . Centrilobular emphysema ([place]) 04/27/2018  . Seasonal allergic rhinitis 04/27/2018  . Tobacco dependence 08/24/2017  . Weight loss, unintentional 08/24/2017  . Insomnia 06/27/2017  . Prediabetes 01/24/2017  . Asthma 06/10/2016  . Essential hypertension 06/10/2016  . Current every day smoker 06/10/2016    Past Surgical History:  Procedure Laterality Date   . INCISE AND DRAIN ABCESS     dog bite       Family History  Problem Relation Age of Onset  . Hypertension Mother   . Cancer Sister   . Hypertension Sister   . Cancer Brother   . Colon cancer Neg Hx   . Colon polyps Neg Hx   . Esophageal cancer Neg Hx   . Rectal cancer Neg Hx   . Stomach cancer Neg Hx     Social History   Tobacco Use  . Smoking status: Current Every Day Smoker    Packs/day: 0.50    Years: 45.00    Pack years: 22.50    Types: Cigarettes  . Smokeless tobacco: Never Used  Substance Use Topics  . Alcohol use: Yes    Alcohol/week: 14.0 standard drinks    Types: 14 Cans of beer per week  . Drug use: Not Currently    Types: "Crack" cocaine    Home Medications Prior to Admission medications   Medication Sig Start Date End Date Taking? Authorizing Provider  naloxone [facility]) 2 MG/2ML injection Inject if unconscious or not breathing 08/21/20  Yes Pearson Grippe, DO  albuterol (VENTOLIN HFA) 108 (90 Base) MCG/ACT inhaler INHALE 2 PUFFS INTO THE LUNGS EVERY 6 (SIX) HOURS AS NEEDED FOR WHEEZING OR SHORTNESS OF BREATH. 04/28/20 04/28/21  Ladell Pier, MD  amLODipine (NORVASC) 5 MG tablet TAKE 1 TABLET (5 MG TOTAL) BY MOUTH DAILY. 04/28/20 04/28/21  Ladell Pier, MD  atorvastatin (LIPITOR) 10 MG tablet  TAKE 1 TABLET (10 MG TOTAL) BY MOUTH DAILY. 04/28/20 04/28/21  Ladell Pier, MD  budesonide-formoterol (SYMBICORT) 80-4.5 MCG/ACT inhaler INHALE 2 PUFFS INTO THE LUNGS 2 (TWO) TIMES DAILY. 04/28/20 04/28/21  Ladell Pier, MD  loratadine (CLARITIN) 10 MG tablet Take 1 tablet (10 mg total) by mouth daily. 04/27/18   Ladell Pier, MD  melatonin 3 MG TABS tablet Take one tab 1 hr prior to bedtime 06/09/20   Ladell Pier, MD  varenicline (CHANTIX STARTING MONTH PAK) 0.5 MG X 11 & 1 MG X 42 tablet Take one 0.5 mg tablet by mouth once daily for 3 days, then increase to one 0.5 mg tablet twice daily for 4 days, then increase to one 1 mg tablet twice daily.  01/06/20   Ladell Pier, MD  vitamin E 1000 UNIT capsule Take 1,000 Units by mouth daily.    [provider]    Allergies    Patient has no known allergies.  Review of Systems   Review of Systems  Unable to perform ROS: Patient unresponsive    Physical Exam Updated Vital Signs BP (!) 136/95   Pulse 60   Temp (!) 96 F (35.6 C) (Temporal)   Resp (!) 8   Ht 5\' 9"  (1.753 m)   Wt 53 kg   SpO2 97%   BMI 17.25 kg/m   Physical Exam Vitals and nursing note reviewed.  Constitutional:      General: He is in acute distress.     Appearance: He is well-developed and underweight. He is ill-appearing.     Interventions: Nasal cannula in place.  HENT:     Head: Normocephalic and atraumatic.  Eyes:     Conjunctiva/sclera: Conjunctivae normal.     Pupils:     Right eye: Pupil is round and reactive.     Left eye: Pupil is round and reactive.     Comments:  Pinpoint pupils  Cardiovascular:     Rate and Rhythm: Normal rate and regular rhythm.     Pulses:          Radial pulses are 2+ on the right side and 2+ on the left side.       Dorsalis pedis pulses are 2+ on the right side and 2+ on the left side.     Heart sounds: No murmur heard.   Pulmonary:     Effort: Pulmonary effort is normal. Bradypnea present. No respiratory distress.     Breath sounds: Normal breath sounds. Decreased air movement present.  Abdominal:     Palpations: Abdomen is soft.     Tenderness: There is no abdominal tenderness.  Musculoskeletal:     Cervical back: Neck supple.     Right lower leg: No edema.     Left lower leg: No edema.  Skin:    General: Skin is warm and dry.     Comments:  No external evidence of trauma  Neurological:     General: No focal deficit present.     Mental Status: He is lethargic.     GCS: GCS eye subscore is 2. GCS verbal subscore is 2. GCS motor subscore is 4.     ED Results / Procedures / Treatments   Labs (all labs ordered are listed, but only  abnormal results are displayed) Labs Reviewed  COMPREHENSIVE METABOLIC PANEL - Abnormal; Notable for the following components:      Result Value   Potassium 3.0 (*)    CO2 19 (*)  Glucose, Bld 166 (*)    Creatinine, Ser 1.26 (*)    All other components within normal limits  CBC - Abnormal; Notable for the following components:   RBC 4.12 (*)    MCV 100.7 (*)    All other components within normal limits  LACTIC ACID, PLASMA - Abnormal; Notable for the following components:   Lactic Acid, Venous 3.4 (*)    All other components within normal limits  ETHANOL - Abnormal; Notable for the following components:   Alcohol, Ethyl (B) 101 (*)    All other components within normal limits  CBG MONITORING, ED - Abnormal; Notable for the following components:   Glucose-Capillary 129 (*)    All other components within normal limits  I-STAT VENOUS BLOOD GAS, ED - Abnormal; Notable for the following components:   pCO2, Ven 42.9 (*)    pO2, Ven 78.0 (*)    Bicarbonate 19.3 (*)    TCO2 21 (*)    Acid-base deficit 7.0 (*)    Potassium 3.0 (*)    All other components within normal limits  AMMONIA  URINALYSIS, COMPLETE (UACMP) WITH MICROSCOPIC  RAPID URINE DRUG SCREEN, HOSP PERFORMED    EKG None  Radiology DG Chest Port 1 View  Result Date: 08/20/2020 CLINICAL DATA:  Found unresponsive EXAM: PORTABLE CHEST 1 VIEW COMPARISON:  07/17/2015 FINDINGS: Single frontal view of the chest demonstrates an unremarkable cardiac silhouette. No airspace disease, effusion, or pneumothorax. No acute bony abnormalities. IMPRESSION: 1. No acute intrathoracic process. Electronically Signed   By: Randa Ngo M.D.   On: 08/20/2020 21:40    Procedures Procedures   Medications Ordered in ED Medications  potassium chloride (KLOR-CON) packet 40 mEq (has no administration in time range)  naloxone [facility]) injection 0.4 mg (0.4 mg Intravenous Given 08/20/20 2058)  ondansetron (ZOFRAN) injection 4 mg (4 mg  Intravenous Given 08/20/20 2059)  naloxone [facility]) injection 1 mg (1 mg Intravenous Given 08/20/20 2104)  lactated ringers bolus 1,000 mL (1,000 mLs Intravenous New Bag/Given 08/20/20 2233)    ED Course  I have reviewed the triage vital signs and the nursing notes.  Pertinent labs & imaging results that were available during my care of the patient were reviewed by me and considered in my medical decision making (see chart for details).    MDM Rules/Calculators/A&P                         Impression is opioid overdose, apparently accidental. Patient received a total of 2.5 mg intranasal Narcan prior to arrival with initial improvement in breathing and mental status. In the emergency department he was hemodynamically stable however having bradypnea with shallow respirations and intermittent hypoxia. His GCS was 8.  He had significant improvement in his mental status and breathing after IV Narcan given in the emergency department. He subsequently had emesis which resolved after IV Zofran. Doubt ACS.  EKG without evidence of ischemia, similar J-point elevations in the lateral leads.  Received a total of 1.4 mg of Narcan over the course of about 10 minutes after arrival.  After which he was able to speak in full sentences.  He was moving all extremities.  He had a nonfocal neurologic exam.  He was not complaining of any pain, shortness of breath, weakness or otherwise.  Patient was observed in the emergency department for 4 hours with only improvement in his mental status. Hypokalemia noted on lab work-treated with p.o. potassium. Lactic acidosis was present, likely  secondary to associated period of hypoxia and relative hypovolemia-treated with IV crystalloid. Renal function at baseline.  Able to tolerate p.o.  Able to ambulate without difficulty. Patient safe and stable for discharge home.  Written a prescription for Narcan.   Final Clinical Impression(s) / ED Diagnoses Final diagnoses:   Opioid overdose, accidental or unintentional, initial encounter [facility])    Rx / DC Orders ED Discharge Orders         Ordered    naloxone [facility]) 2 MG/2ML injection        08/21/20 0103           Pearson Grippe, DO 08/21/20 0105    Davonna Belling, MD 08/21/20 2357

## 2020-08-21 ENCOUNTER — Emergency Department (HOSPITAL_COMMUNITY): Payer: Medicare Other

## 2020-08-21 MED ORDER — METOCLOPRAMIDE HCL 5 MG/ML IJ SOLN
5.0000 mg | Freq: Once | INTRAMUSCULAR | Status: DC
  Filled 2020-08-21: qty 2

## 2020-08-21 MED ORDER — ONDANSETRON 4 MG PO TBDP
4.0000 mg | ORAL_TABLET | Freq: Once | ORAL | Status: AC
  Administered 2020-08-21: 4 mg via ORAL
  Filled 2020-08-21: qty 1

## 2020-08-21 MED ORDER — NALOXONE HCL 2 MG/2ML IJ SOSY: PREFILLED_SYRINGE | INTRAMUSCULAR | 0 refills | Status: DC

## 2020-08-21 MED ORDER — ONDANSETRON 8 MG PO TBDP: ORAL_TABLET | ORAL | 0 refills | Status: DC

## 2020-08-21 MED ORDER — METOCLOPRAMIDE HCL 5 MG/ML IJ SOLN
5.0000 mg | Freq: Once | INTRAMUSCULAR | Status: AC
  Administered 2020-08-21: 5 mg via INTRAVENOUS

## 2020-08-21 MED ORDER — PREDNISONE 20 MG PO TABS: ORAL_TABLET | ORAL | 0 refills | Status: DC

## 2020-08-21 MED ORDER — DEXAMETHASONE 4 MG PO TABS
10.0000 mg | ORAL_TABLET | Freq: Once | ORAL | Status: AC
  Administered 2020-08-21: 10 mg via ORAL
  Filled 2020-08-21: qty 3

## 2020-08-21 MED ORDER — METHYLPREDNISOLONE SODIUM SUCC 125 MG IJ SOLR
125.0000 mg | Freq: Once | INTRAMUSCULAR | Status: DC
  Filled 2020-08-21: qty 2

## 2020-08-21 MED ORDER — ALBUTEROL SULFATE HFA 108 (90 BASE) MCG/ACT IN AERS
4.0000 | INHALATION_SPRAY | Freq: Once | RESPIRATORY_TRACT | Status: AC
  Administered 2020-08-21: 4 via RESPIRATORY_TRACT
  Filled 2020-08-21: qty 6.7

## 2020-08-21 MED ORDER — METHYLPREDNISOLONE SODIUM SUCC 125 MG IJ SOLR
125.0000 mg | Freq: Once | INTRAMUSCULAR | Status: AC
  Administered 2020-08-21: 125 mg via INTRAVENOUS

## 2020-08-21 NOTE — ED Provider Notes (Signed)
Patient was boarding overnight pending po challenge 2/2 vomiting after opioid reversal on previous shift. Apparently has been coughing. sats as low as 87 when ambulating but not in resp distress. Patient says he feels like this often at home and is c/w COPD exac. He thinks if he had his inhaler he would do better. Repeat xr looks similar to first, doubt aspiration, pneumonia or associated puolmonary edema. Suspect he is always slightly hypoxic. Will give inhaler and reassess.    Jason Wong, Corene Cornea, MD 08/22/20 [phone]

## 2020-08-21 NOTE — ED Notes (Signed)
Congested cough noted with sats 88-91% MD aware

## 2020-08-21 NOTE — ED Notes (Signed)
Patient will wake up to voice but keeps falling back asleep.

## 2020-08-21 NOTE — ED Notes (Signed)
Ambulated patient and sats dropped to 87% HR 83

## 2020-08-21 NOTE — ED Notes (Signed)
Patient given ordered meds and immediately vomited up decadron.  Dr Dayna Barker notified.  WIll order other nausea meds.

## 2020-08-21 NOTE — ED Notes (Signed)
Pt tolerating PO fluids at this time.

## 2020-08-21 NOTE — ED Notes (Signed)
Pt sat up after peripheral IV's removed to put shirt on. Pt actively vomiting. Dr. Jenean Lindau notified and new orders received.

## 2020-08-27 ENCOUNTER — Other Ambulatory Visit: Payer: Self-pay

## 2020-08-27 ENCOUNTER — Ambulatory Visit: Payer: Medicare Other | Attending: Internal Medicine | Admitting: Internal Medicine

## 2020-08-27 VITALS — BP 146/96 | HR 67 | Resp 16 | Wt 120.0 lb

## 2020-08-27 DIAGNOSIS — J432 Centrilobular emphysema: Secondary | ICD-10-CM

## 2020-08-27 DIAGNOSIS — I10 Essential (primary) hypertension: Secondary | ICD-10-CM | POA: Diagnosis not present

## 2020-08-27 DIAGNOSIS — I2510 Atherosclerotic heart disease of native coronary artery without angina pectoris: Secondary | ICD-10-CM

## 2020-08-27 DIAGNOSIS — F1910 Other psychoactive substance abuse, uncomplicated: Secondary | ICD-10-CM | POA: Diagnosis not present

## 2020-08-27 DIAGNOSIS — Z23 Encounter for immunization: Secondary | ICD-10-CM

## 2020-08-27 MED ORDER — FLUTICASONE FUROATE-VILANTEROL 200-25 MCG/INH IN AEPB
1.0000 | INHALATION_SPRAY | Freq: Every day | RESPIRATORY_TRACT | 11 refills | Status: DC
  Filled 2020-08-27: qty 60, 30d supply, fill #0

## 2020-08-27 NOTE — Progress Notes (Signed)
Patient ID: Jason Wong, male    DOB: February 17, 1956  MRN: [id number]  CC: Hypertension   Subjective: Jason Wong is a 65 y.o. male who presents for chronic ds management His concerns today include:  Pt with hx of HTN, tob, seasonal allergies, asthma, centrilobular emphysema, insomnia, coronary atherosclerosis, pre-DM and hep C treated.  Pt did not bring meds with him. Tells me he only takes his BP and cholesterol med but out for 1 wk.  Given 90 day supply with Rfs on last visit but reports transportation issues in getting to pharmacy.  Plans to pick up Rfs today.  COPD:  In ER recently for drug overdose.  Found down and unresponsive by roommates at his apartment.  Patient reports that he is crack cocaine and had purchased some drugs that he thinks may have been laced with heroin.  He admits that he drinks alcohol but is vague about the amount that he drinks.  He eventually stated that he may drink a 16 ounce can of beer a day. -Given naloxone kit from the ER but patient states that he does not have it. Reports doing a 90-day treatment program in [place] many years ago and a 30-day program in [place] several years ago.  He does not drive.  Denies ever having DWI.  Still smoking cigarettes but states not that much maybe 1 a day.  HM:  did not COVID.  Needs Shingles vaccine Patient Active Problem List   Diagnosis Date Noted   Memory deficit 04/28/2020   Hepatitis C virus infection cured after antiviral drug therapy 04/27/2018   Enuresis 04/27/2018   Centrilobular emphysema ([place]) 04/27/2018   Seasonal allergic rhinitis 04/27/2018   Tobacco dependence 08/24/2017   Weight loss, unintentional 08/24/2017   Insomnia 06/27/2017   Prediabetes 01/24/2017   Asthma 06/10/2016   Essential hypertension 06/10/2016   Current every day smoker 06/10/2016     Current Outpatient Medications on File Prior to Visit  Medication Sig Dispense Refill   amLODipine (NORVASC) 5 MG tablet TAKE 1 TABLET  (5 MG TOTAL) BY MOUTH DAILY. 90 tablet 2   atorvastatin (LIPITOR) 10 MG tablet TAKE 1 TABLET (10 MG TOTAL) BY MOUTH DAILY. 90 tablet 3   albuterol (VENTOLIN HFA) 108 (90 Base) MCG/ACT inhaler INHALE 2 PUFFS INTO THE LUNGS EVERY 6 (SIX) HOURS AS NEEDED FOR WHEEZING OR SHORTNESS OF BREATH. (Patient not taking: Reported on 08/27/2020) 8.5 g 2   naloxone (NARCAN) 2 MG/2ML injection Inject if unconscious or not breathing (Patient not taking: Reported on 08/27/2020) 2 mL 0   No current facility-administered medications on file prior to visit.    No Known Allergies  Social History   Socioeconomic History   Marital status: Single    Spouse name: Not on file   Number of children: Not on file   Years of education: Not on file   Highest education level: Not on file  Occupational History   Not on file  Tobacco Use   Smoking status: Every Day    Packs/day: 0.50    Years: 45.00    Pack years: 22.50    Types: Cigarettes   Smokeless tobacco: Never  Substance and Sexual Activity   Alcohol use: Yes    Alcohol/week: 14.0 standard drinks    Types: 14 Cans of beer per week   Drug use: Not Currently    Types: "Crack" cocaine   Sexual activity: Yes    Partners: Female  Other Topics Concern   Not on  file  Social History Narrative   Not on file   Social Determinants of Health   Financial Resource Strain: Not on file  Food Insecurity: Not on file  Transportation Needs: Not on file  Physical Activity: Not on file  Stress: Not on file  Social Connections: Not on file  Intimate Partner Violence: Not on file    Family History  Problem Relation Age of Onset   Hypertension Mother    Cancer Sister    Hypertension Sister    Cancer Brother    Colon cancer Neg Hx    Colon polyps Neg Hx    Esophageal cancer Neg Hx    Rectal cancer Neg Hx    Stomach cancer Neg Hx     Past Surgical History:  Procedure Laterality Date   INCISE AND DRAIN ABCESS     dog bite    ROS: Review of  Systems Negative except as stated above  PHYSICAL EXAM: BP (!) 146/96   Pulse 67   Resp 16   Wt 120 lb (54.4 kg)   SpO2 98%   BMI 17.72 kg/m   Wt Readings from Last 3 Encounters:  08/27/20 120 lb (54.4 kg)  08/20/20 116 lb 13.5 oz (53 kg)  04/28/20 128 lb 3.2 oz (58.2 kg)    Physical Exam  General appearance - alert, well appearing, and in no distress Neck - supple, no significant adenopathy Chest -breath sounds slightly decreased bilaterally.  No wheezes or crackles heard Heart - normal rate, regular rhythm, normal S1, S2, no murmurs, rubs, clicks or gallops Extremities - peripheral pulses normal, no pedal edema, no clubbing or cyanosis  CMP Latest Ref Rng & Units 08/20/2020 08/20/2020 01/07/2020  Glucose 70 - 99 mg/dL - 166(H) 109(H)  BUN 8 - 23 mg/dL - 14 14  Creatinine 0.61 - 1.24 mg/dL - 1.26(H) 1.31(H)  Sodium 135 - 145 mmol/L 143 139 144  Potassium 3.5 - 5.1 mmol/L 3.0(L) 3.0(L) 4.1  Chloride 98 - 111 mmol/L - 106 101  CO2 22 - 32 mmol/L - 19(L) 25  Calcium 8.9 - 10.3 mg/dL - 9.2 10.0  Total Protein 6.5 - 8.1 g/dL - 7.8 -  Total Bilirubin 0.3 - 1.2 mg/dL - 0.8 -  Alkaline Phos 38 - 126 U/L - 77 -  AST 15 - 41 U/L - 23 -  ALT 0 - 44 U/L - 14 -   Lipid Panel     Component Value Date/Time   CHOL 171 09/06/2019 1053   TRIG 57 09/06/2019 1053   HDL 70 09/06/2019 1053   CHOLHDL 2.4 09/06/2019 1053   LDLCALC 90 09/06/2019 1053    CBC    Component Value Date/Time   WBC 9.2 08/20/2020 2107   RBC 4.12 (L) 08/20/2020 2107   HGB 14.3 08/20/2020 2119   HGB 17.2 09/06/2019 1053   HCT 42.0 08/20/2020 2119   HCT 49.9 09/06/2019 1053   PLT 268 08/20/2020 2107   PLT 274 09/06/2019 1053   MCV 100.7 (H) 08/20/2020 2107   MCV 98 (H) 09/06/2019 1053   MCH 33.3 08/20/2020 2107   MCHC 33.0 08/20/2020 2107   RDW 13.2 08/20/2020 2107   RDW 11.5 (L) 09/06/2019 1053   LYMPHSABS 2.5 06/13/2016 1110   EOSABS 0.1 06/13/2016 1110   BASOSABS 0.0 06/13/2016 1110    ASSESSMENT  AND PLAN: 1. Essential hypertension Not at goal. Patient plans to stop at the pharmacy today to get the Norvasc refilled.  I told him that  if he has issues with transportation getting to the pharmacy, he can call and arrange that his medicine be sent to him through the mail.  2. Centrilobular emphysema (HCC)  - fluticasone furoate-vilanterol (BREO ELLIPTA) 200-25 MCG/INH AEPB; Inhale 1 puff into the lungs daily.  Dispense: 60 each; Refill: 11  3. Atherosclerosis of native coronary artery of native heart without angina pectoris Patient plans to refill atorvastatin today.  4. Polysubstance abuse ([place]) Strongly advised that he gets into a treatment program.  He is agreeable to referral to behavioral health. He declines me writing a prescription for naloxone kit - Ambulatory referral to Psychiatry  5. Need for shingles vaccine To be given by clinical pharmacist on his visit next week when he comes for blood pressure check.    Patient was given the opportunity to ask questions.  Patient verbalized understanding of the plan and was able to repeat key elements of the plan.   Orders Placed This Encounter  Procedures   Ambulatory referral to Psychiatry     Requested Prescriptions   Signed Prescriptions Disp Refills   fluticasone furoate-vilanterol (BREO ELLIPTA) 200-25 MCG/INH AEPB 60 each 11    Sig: Inhale 1 puff into the lungs daily.    Return in about 4 months (around 12/27/2020).  Karle Plumber, MD, FACP

## 2020-08-28 ENCOUNTER — Other Ambulatory Visit: Payer: Self-pay

## 2020-08-28 MED FILL — Amlodipine Besylate Tab 5 MG (Base Equivalent): ORAL | 90 days supply | Qty: 90 | Fill #0 | Status: AC

## 2020-08-28 MED FILL — Atorvastatin Calcium Tab 10 MG (Base Equivalent): ORAL | 90 days supply | Qty: 90 | Fill #0 | Status: AC

## 2020-08-31 ENCOUNTER — Ambulatory Visit: Payer: Medicare Other | Admitting: Pharmacist

## 2020-09-05 DIAGNOSIS — Z20822 Contact with and (suspected) exposure to covid-19: Secondary | ICD-10-CM | POA: Diagnosis not present

## 2020-09-09 ENCOUNTER — Encounter: Payer: Self-pay | Admitting: Pharmacist

## 2020-09-09 ENCOUNTER — Ambulatory Visit: Payer: Medicare Other | Attending: Internal Medicine | Admitting: Pharmacist

## 2020-09-09 ENCOUNTER — Other Ambulatory Visit: Payer: Self-pay

## 2020-09-09 VITALS — BP 132/87

## 2020-09-09 DIAGNOSIS — Z23 Encounter for immunization: Secondary | ICD-10-CM | POA: Diagnosis not present

## 2020-09-09 NOTE — Progress Notes (Signed)
S:    Patient arrives on 09/09/20. Presents to the clinic for hypertension evaluation, counseling, and management. Patient was last seen by Primary Care Provider on 08/27/20. At that visit, medication adherence denied due to the patient not able to get amlodipine from pharmacy and had been out for 1 week.  At today's visit, patient reports being able to pick up medication from [facility] pharmacy 1-2 weeks ago. Since then has not missed any doses. Patient denies headache or dizziness. Patient does endorse salt restriction in his diet and drinks sodas when "he has a taste for them". Patient states that he has cut back to about 3 or less cigarettes a day and will drink 2 beers a day, but not every day. Patient did have ED visit on 08/20/2020 for cocaine overdose and states that since discharge, he has not used illicit drugs. Patient has not taken blood pressure medication nor eaten this morning.   Current BP Medications include: amlodipine 5mg  once daily  Antihypertensives tried in the past include: none  Dietary habits include: endorses salt restriction, drink sodas when "he has a taste for them", has not been much water but is trying to do better Exercise habits include: does a lot of walking  Family / Social history: HTN in mother and sister Tobacco: current everyday smoker (0.5 ppd for 45 years) but now smokes ~3 or less cigarettes per day Alcohol: 14 cans of beer/week prior but now drinks 2 cans of beer/day when he feels like drinking Illicit Drugs: patient does endorse use of "crack" cocaine  O:   Home BP readings: does not take blood pressure at home   Last 3 Office BP readings: BP Readings from Last 3 Encounters:  08/27/20 (!) 146/96  08/21/20 (!) 145/87  07/10/20 107/76    BMET    Component Value Date/Time   NA 143 08/20/2020 2119   NA 144 01/07/2020 0934   K 3.0 (L) 08/20/2020 2119   CL 106 08/20/2020 2107   CO2 19 (L) 08/20/2020 2107   GLUCOSE 166 (H) 08/20/2020 2107   BUN 14  08/20/2020 2107   BUN 14 01/07/2020 0934   CREATININE 1.26 (H) 08/20/2020 2107   CREATININE 1.18 10/03/2017 1037   CALCIUM 9.2 08/20/2020 2107   GFRNONAA >60 08/20/2020 2107   GFRAA 66 01/07/2020 0934    Renal function: Estimated Creatinine Clearance: 45 mL/min (A) (by C-G formula based on SCr of 1.26 mg/dL (H)).  Clinical ASCVD: No  The 10-year ASCVD risk score Mikey Bussing DC Jr., et al., 2013) is: 28.5%   Values used to calculate the score:     Age: 75 years     Sex: Male     Is Non-Hispanic African American: Yes     Diabetic: No     Tobacco smoker: Yes     Systolic Blood Pressure: 720 mmHg     Is BP treated: Yes     HDL Cholesterol: 70 mg/dL     Total Cholesterol: 171 mg/dL  A/P: Hypertension longstanding currently uncontrolled, however, patient did not take meds this morning. BP Goal = <130/80 mmHg. Medication adherence reported. Encouraged patient to continue to restrict salt in diet as well as sodas. Congratulated patient on reduced tobacco use. Emphasized the importance of taking/recording blood pressure at home. Also encouraged patient to take blood pressure medications prior to appointments.  - No changes to medication regimen. -Continued amlodipine 5mg  once daily.  -F/u labs ordered - none  -Counseled on lifestyle modifications for blood pressure  control including reduced dietary sodium, increased exercise, adequate sleep.  Health Maintenance -- After obtaining consent, and per orders of Dr. Wynetta Emery, injection of Shingrix given by Debara Pickett. Patient instructed to remain in clinic for 20 minutes afterwards, and to report any adverse reaction to me immediately. 2nd dose of Shingrix due in 2-6 months.   Results reviewed and written information provided.   Total time in face-to-face counseling 30 minutes.  F/U Clinic Visit in 1 month.    Patient seen with:  Dorian Furnace PharmD/MBA Candidate [facility] [employer], PharmD, [place],  [place] [phone]

## 2020-09-12 DIAGNOSIS — Z20822 Contact with and (suspected) exposure to covid-19: Secondary | ICD-10-CM | POA: Diagnosis not present

## 2020-09-20 DIAGNOSIS — Z20822 Contact with and (suspected) exposure to covid-19: Secondary | ICD-10-CM | POA: Diagnosis not present

## 2020-09-28 DIAGNOSIS — Z20822 Contact with and (suspected) exposure to covid-19: Secondary | ICD-10-CM | POA: Diagnosis not present

## 2020-10-03 DIAGNOSIS — Z20822 Contact with and (suspected) exposure to covid-19: Secondary | ICD-10-CM | POA: Diagnosis not present

## 2020-10-09 ENCOUNTER — Telehealth: Payer: Self-pay | Admitting: Pharmacist

## 2020-10-09 ENCOUNTER — Other Ambulatory Visit: Payer: Self-pay

## 2020-10-09 ENCOUNTER — Ambulatory Visit: Payer: Medicare Other | Attending: Internal Medicine | Admitting: Pharmacist

## 2020-10-09 VITALS — BP 112/71 | HR 71

## 2020-10-09 DIAGNOSIS — J432 Centrilobular emphysema: Secondary | ICD-10-CM | POA: Diagnosis not present

## 2020-10-09 DIAGNOSIS — I10 Essential (primary) hypertension: Secondary | ICD-10-CM | POA: Diagnosis not present

## 2020-10-09 MED ORDER — BENZONATATE 100 MG PO CAPS
100.0000 mg | ORAL_CAPSULE | Freq: Two times a day (BID) | ORAL | 0 refills | Status: DC | PRN
  Filled 2020-10-09: qty 20, 10d supply, fill #0

## 2020-10-09 MED ORDER — ALBUTEROL SULFATE HFA 108 (90 BASE) MCG/ACT IN AERS
2.0000 | INHALATION_SPRAY | Freq: Four times a day (QID) | RESPIRATORY_TRACT | 2 refills | Status: DC | PRN
  Filled 2020-10-09: qty 8.5, 25d supply, fill #0

## 2020-10-09 NOTE — Telephone Encounter (Signed)
I saw this patient for BP management. He is requesting a rx for his cough. I told him I would let his PCP know. However, since she is out of town I will send this request to the provider covering for her.

## 2020-10-09 NOTE — Telephone Encounter (Signed)
Patient called and informed

## 2020-10-09 NOTE — Telephone Encounter (Signed)
Prescription has been sent to his pharmacy.

## 2020-10-09 NOTE — Progress Notes (Signed)
   S:    Patient arrives on 09/09/20. Presents to the clinic for hypertension evaluation, counseling, and management. Patient was last seen by Primary Care Provider on 08/27/20. I saw him on 09/09/2020. His BP was okay at that visit. We did not make changes. Of note, we did give him dose #1 of his Shingles vaccine.  At today's visit, patient endorses good adherence. He has taken his medication this morning. Denies chest pain/dyspnea/HA/blurred vision. He endorses a dry cough that started ~2 days ago. Denies congestion/rhinorrhea. He is afebrile and does not feel weak or fatigued.   Current BP Medications include: amlodipine '5mg'$  once daily  Antihypertensives tried in the past include: none  Dietary habits include: endorses salt restriction, drink sodas when "he has a taste for them", has not been much water but is trying to do better Exercise habits include: does a lot of walking  Family / Social history: HTN in mother and sister Tobacco: current everyday smoker (0.5 ppd for 45 years) but now smokes ~3 or less cigarettes per day Alcohol: 14 cans of beer/week prior but now drinks 2 cans of beer/day when he feels like drinking Illicit Drugs: patient does endorse use of "crack" cocaine  O:  Vitals:   10/09/20 1114  BP: 112/71  Pulse: 71    Home BP readings: does not take blood pressure at home   Last 3 Office BP readings: BP Readings from Last 3 Encounters:  10/09/20 112/71  09/09/20 132/87  08/27/20 (!) 146/96    BMET    Component Value Date/Time   NA 143 08/20/2020 2119   NA 144 01/07/2020 0934   K 3.0 (L) 08/20/2020 2119   CL 106 08/20/2020 2107   CO2 19 (L) 08/20/2020 2107   GLUCOSE 166 (H) 08/20/2020 2107   BUN 14 08/20/2020 2107   BUN 14 01/07/2020 0934   CREATININE 1.26 (H) 08/20/2020 2107   CREATININE 1.18 10/03/2017 1037   CALCIUM 9.2 08/20/2020 2107   GFRNONAA >60 08/20/2020 2107   GFRAA 66 01/07/2020 0934    Renal function: CrCl cannot be calculated (Patient's  most recent lab result is older than the maximum 21 days allowed.).  Clinical ASCVD: No  The 10-year ASCVD risk score Mikey Bussing DC Jr., et al., 2013) is: 18.3%   Values used to calculate the score:     Age: 65 years     Sex: Male     Is Non-Hispanic African American: Yes     Diabetic: No     Tobacco smoker: Yes     Systolic Blood Pressure: [id number] mmHg     Is BP treated: Yes     HDL Cholesterol: 70 mg/dL     Total Cholesterol: 171 mg/dL  A/P: Hypertension longstanding currently at goal. BP Goal = <130/80 mmHg. Medication adherence reported. Encouraged patient to continue to restrict salt in diet as well as sodas. Congratulated patient on reduced tobacco use. Emphasized the importance of taking/recording blood pressure at home. Also encouraged patient to take blood pressure medications prior to appointments.  - No changes to medication regimen. -Continued amlodipine '5mg'$  once daily.  -F/u labs ordered - none  -Counseled on lifestyle modifications for blood pressure control including reduced dietary sodium, increased exercise, adequate sleep.  Results reviewed and written information provided. Total time in face-to-face counseling 30 minutes.  F/U Clinic Visit in 1.5 months.    Benard Halsted, PharmD, [facility] [place], [place] [phone]

## 2020-10-10 DIAGNOSIS — Z20822 Contact with and (suspected) exposure to covid-19: Secondary | ICD-10-CM | POA: Diagnosis not present

## 2020-10-16 ENCOUNTER — Other Ambulatory Visit: Payer: Self-pay

## 2020-10-17 DIAGNOSIS — Z20822 Contact with and (suspected) exposure to covid-19: Secondary | ICD-10-CM | POA: Diagnosis not present

## 2020-10-23 ENCOUNTER — Encounter: Payer: Self-pay | Admitting: Gastroenterology

## 2020-10-26 DIAGNOSIS — Z20822 Contact with and (suspected) exposure to covid-19: Secondary | ICD-10-CM | POA: Diagnosis not present

## 2020-12-10 ENCOUNTER — Ambulatory Visit: Payer: Medicare Other | Admitting: Pharmacist

## 2020-12-10 NOTE — Progress Notes (Unsigned)
   S:    Patient arrives on 09/09/20. Presents to the clinic for hypertension evaluation, counseling, and management. Patient was last seen by Primary Care Provider on 08/27/20. Last pharmacy visit was 10/09/2020. At that visit, BP was 112/71 and no changes were made to his HTN regimen.   At today's visit, patient endorses good adherence ***. He has taken his medication this morning. Denies chest pain/dyspnea/HA/blurred vision.  Current BP Medications include: amlodipine 5mg  once daily  Antihypertensives tried in the past include: none  Dietary habits include: endorses salt restriction, drink sodas when "he has a taste for them", has not been much water but is trying to do better Exercise habits include: does a lot of walking  Family / Social history: HTN in mother and sister Tobacco: current everyday smoker (0.5 ppd for 45 years) but now smokes ~3 or less cigarettes per day Alcohol: 14 cans of beer/week prior but now drinks 2 cans of beer/day when he feels like drinking Illicit Drugs: patient does endorse use of "crack" cocaine  O:  There were no vitals filed for this visit.  Home BP readings: does not take blood pressure at home   Last 3 Office BP readings: BP Readings from Last 3 Encounters:  10/09/20 112/71  09/09/20 132/87  08/27/20 (!) 146/96   BMET    Component Value Date/Time   NA 143 08/20/2020 2119   NA 144 01/07/2020 0934   K 3.0 (L) 08/20/2020 2119   CL 106 08/20/2020 2107   CO2 19 (L) 08/20/2020 2107   GLUCOSE 166 (H) 08/20/2020 2107   BUN 14 08/20/2020 2107   BUN 14 01/07/2020 0934   CREATININE 1.26 (H) 08/20/2020 2107   CREATININE 1.18 10/03/2017 1037   CALCIUM 9.2 08/20/2020 2107   GFRNONAA >60 08/20/2020 2107   GFRAA 66 01/07/2020 0934   Renal function: CrCl cannot be calculated (Patient's most recent lab result is older than the maximum 21 days allowed.).  Clinical ASCVD: No  The 10-year ASCVD risk score (Arnett DK, et al., 2019) is: 18.3%   Values used  to calculate the score:     Age: 65 years     Sex: Male     Is Non-Hispanic African American: Yes     Diabetic: No     Tobacco smoker: Yes     Systolic Blood Pressure: 601 mmHg     Is BP treated: Yes     HDL Cholesterol: 70 mg/dL     Total Cholesterol: 171 mg/dL  A/P: Hypertension longstanding currently at goal. BP Goal = <130/80 mmHg. Medication adherence reported. Encouraged patient to continue to restrict salt in diet as well as sodas. Congratulated patient on reduced tobacco use. Emphasized the importance of taking/recording blood pressure at home. Also encouraged patient to take blood pressure medications prior to appointments.  - No changes to medication regimen. -Continued amlodipine 5mg  once daily.  -F/u labs ordered - none  -Counseled on lifestyle modifications for blood pressure control including reduced dietary sodium, increased exercise, adequate sleep.  Results reviewed and written information provided. Total time in face-to-face counseling 30 minutes.  F/U Clinic Visit in 1.5 months.    Joseph Art, Pharm.D. PGY-1 Pharmacy Resident 12/10/2020 8:45 AM

## 2020-12-14 ENCOUNTER — Other Ambulatory Visit: Payer: Self-pay

## 2020-12-15 ENCOUNTER — Telehealth: Payer: Self-pay | Admitting: Internal Medicine

## 2020-12-15 NOTE — Telephone Encounter (Signed)
Copied from [place] [id number]. Topic: Appointment Scheduling - Scheduling Inquiry for Clinic >> Dec 10, 2020  9:01 AM Yvette Rack wrote: Reason for CRM: Pt called to reschedule appt with [facility]. Cb# [phone]   Called patient and left vm to call [phone] to schedule appt with [facility]

## 2020-12-17 ENCOUNTER — Encounter: Payer: Self-pay | Admitting: Gastroenterology

## 2020-12-21 ENCOUNTER — Other Ambulatory Visit: Payer: Self-pay

## 2020-12-21 ENCOUNTER — Emergency Department (HOSPITAL_COMMUNITY): Payer: Medicare Other

## 2020-12-21 ENCOUNTER — Emergency Department (HOSPITAL_COMMUNITY)
Admission: EM | Admit: 2020-12-21 | Discharge: 2020-12-21 | Disposition: A | Discharge status: Home / Self Care | Payer: Medicare Other | Attending: Emergency Medicine | Admitting: Emergency Medicine

## 2020-12-21 ENCOUNTER — Encounter (HOSPITAL_COMMUNITY): Payer: Self-pay | Admitting: Emergency Medicine

## 2020-12-21 DIAGNOSIS — J449 Chronic obstructive pulmonary disease, unspecified: Secondary | ICD-10-CM | POA: Diagnosis not present

## 2020-12-21 DIAGNOSIS — R0602 Shortness of breath: Secondary | ICD-10-CM | POA: Insufficient documentation

## 2020-12-21 DIAGNOSIS — J45909 Unspecified asthma, uncomplicated: Secondary | ICD-10-CM | POA: Diagnosis not present

## 2020-12-21 DIAGNOSIS — I10 Essential (primary) hypertension: Secondary | ICD-10-CM | POA: Insufficient documentation

## 2020-12-21 DIAGNOSIS — J432 Centrilobular emphysema: Secondary | ICD-10-CM

## 2020-12-21 DIAGNOSIS — Z79899 Other long term (current) drug therapy: Secondary | ICD-10-CM | POA: Insufficient documentation

## 2020-12-21 DIAGNOSIS — R059 Cough, unspecified: Secondary | ICD-10-CM | POA: Diagnosis not present

## 2020-12-21 DIAGNOSIS — F17210 Nicotine dependence, cigarettes, uncomplicated: Secondary | ICD-10-CM | POA: Insufficient documentation

## 2020-12-21 LAB — CBC WITH DIFFERENTIAL/PLATELET
Abs Immature Granulocytes: 0.01 10*3/uL (ref 0.00–0.07)
Basophils Absolute: 0.1 10*3/uL (ref 0.0–0.1)
Basophils Relative: 1 %
Eosinophils Absolute: 0.2 10*3/uL (ref 0.0–0.5)
Eosinophils Relative: 4 %
HCT: 41.8 % (ref 39.0–52.0)
Hemoglobin: 14 g/dL (ref 13.0–17.0)
Immature Granulocytes: 0 %
Lymphocytes Relative: 24 %
Lymphs Abs: 1.2 10*3/uL (ref 0.7–4.0)
MCH: 33.4 pg (ref 26.0–34.0)
MCHC: 33.5 g/dL (ref 30.0–36.0)
MCV: 99.8 fL (ref 80.0–100.0)
Monocytes Absolute: 0.8 10*3/uL (ref 0.1–1.0)
Monocytes Relative: 15 %
Neutro Abs: 2.9 10*3/uL (ref 1.7–7.7)
Neutrophils Relative %: 56 %
Platelets: 259 10*3/uL (ref 150–400)
RBC: 4.19 MIL/uL — ABNORMAL LOW (ref 4.22–5.81)
RDW: 13.3 % (ref 11.5–15.5)
WBC: 5.2 10*3/uL (ref 4.0–10.5)
nRBC: 0 % (ref 0.0–0.2)

## 2020-12-21 LAB — BASIC METABOLIC PANEL
Anion gap: 10 (ref 5–15)
BUN: 7 mg/dL — ABNORMAL LOW (ref 8–23)
CO2: 26 mmol/L (ref 22–32)
Calcium: 9.7 mg/dL (ref 8.9–10.3)
Chloride: 101 mmol/L (ref 98–111)
Creatinine, Ser: 1.15 mg/dL (ref 0.61–1.24)
GFR, Estimated: >60 mL/min (ref >60)
Glucose, Bld: 107 mg/dL — ABNORMAL HIGH (ref 70–99)
Potassium: 4.3 mmol/L (ref 3.5–5.1)
Sodium: 137 mmol/L (ref 135–145)

## 2020-12-21 LAB — BRAIN NATRIURETIC PEPTIDE: B Natriuretic Peptide: 37.1 pg/mL (ref 0.0–100.0)

## 2020-12-21 MED ORDER — PREDNISONE 20 MG PO TABS
40.0000 mg | ORAL_TABLET | Freq: Once | ORAL | Status: AC
  Administered 2020-12-21: 40 mg via ORAL
  Filled 2020-12-21: qty 2

## 2020-12-21 MED ORDER — AEROCHAMBER PLUS FLO-VU LARGE MISC
Status: AC
  Administered 2020-12-21: 1
  Filled 2020-12-21: qty 1

## 2020-12-21 MED ORDER — AEROCHAMBER PLUS FLO-VU MISC
1.0000 | Freq: Once | Status: AC
  Filled 2020-12-21: qty 1

## 2020-12-21 MED ORDER — FLUTICASONE FUROATE-VILANTEROL 200-25 MCG/INH IN AEPB
1.0000 | INHALATION_SPRAY | Freq: Every day | RESPIRATORY_TRACT | 11 refills | Status: DC
  Filled 2020-12-21: qty 60, 30d supply, fill #0

## 2020-12-21 MED ORDER — ALBUTEROL SULFATE HFA 108 (90 BASE) MCG/ACT IN AERS
2.0000 | INHALATION_SPRAY | Freq: Once | RESPIRATORY_TRACT | Status: AC
  Administered 2020-12-21: 2 via RESPIRATORY_TRACT
  Filled 2020-12-21: qty 6.7

## 2020-12-21 MED ORDER — IPRATROPIUM-ALBUTEROL 0.5-2.5 (3) MG/3ML IN SOLN: 3.0000 mL | Freq: Once | RESPIRATORY_TRACT | Status: DC

## 2020-12-21 MED ORDER — PREDNISONE 10 MG PO TABS
40.0000 mg | ORAL_TABLET | Freq: Every day | ORAL | 0 refills | Status: DC
  Filled 2020-12-21: qty 16, 4d supply, fill #0

## 2020-12-21 NOTE — ED Provider Notes (Signed)
[facility] EMERGENCY DEPARTMENT Provider Note   CSN: [id number] Arrival date & time: 12/21/20  1054     History Chief Complaint  Patient presents with   Shortness of Breath    Jason Wong is a 65 y.o. male.  65 year old male with a past medical history of emphysema and asthma presents today for 1 week duration of dyspnea that has been constant without progressive worsening.  He reports productive cough but not changed from his baseline.  He endorses wheezing over the past week as well.  He reports he is out of his inhalers and has not been taking his rescue or maintenance inhalers.  He is unsure what maintenance inhaler he takes.  He denies fever, chills, chest pain, or URI symptoms.  The history is provided by the patient. No language interpreter was used.      Past Medical History:  Diagnosis Date   Asthma    Hepatitis C antibody positive in blood    HTN (hypertension)     Patient Active Problem List   Diagnosis Date Noted   Memory deficit 04/28/2020   Hepatitis C virus infection cured after antiviral drug therapy 04/27/2018   Enuresis 04/27/2018   Centrilobular emphysema ([place]) 04/27/2018   Seasonal allergic rhinitis 04/27/2018   Tobacco dependence 08/24/2017   Weight loss, unintentional 08/24/2017   Insomnia 06/27/2017   Prediabetes 01/24/2017   Asthma 06/10/2016   Essential hypertension 06/10/2016   Current every day smoker 06/10/2016    Past Surgical History:  Procedure Laterality Date   INCISE AND DRAIN ABCESS     dog bite       Family History  Problem Relation Age of Onset   Hypertension Mother    Breast cancer Sister    Hypertension Sister    Colon cancer Brother    Colon polyps Neg Hx    Esophageal cancer Neg Hx    Rectal cancer Neg Hx    Stomach cancer Neg Hx     Social History   Tobacco Use   Smoking status: Every Day    Packs/day: 0.50    Years: 45.00    Pack years: 22.50    Types: Cigarettes   Smokeless tobacco:  Never  Substance Use Topics   Alcohol use: Yes    Alcohol/week: 14.0 standard drinks    Types: 14 Cans of beer per week   Drug use: Not Currently    Types: "Crack" cocaine    Home Medications Prior to Admission medications   Medication Sig Start Date End Date Taking? Authorizing Provider  albuterol (VENTOLIN HFA) 108 (90 Base) MCG/ACT inhaler INHALE 2 PUFFS INTO THE LUNGS EVERY 6 (SIX) HOURS AS NEEDED FOR WHEEZING OR SHORTNESS OF BREATH. 10/09/20 10/09/21  Ladell Pier, MD  amLODipine (NORVASC) 5 MG tablet TAKE 1 TABLET (5 MG TOTAL) BY MOUTH DAILY. 04/28/20 04/28/21  Ladell Pier, MD  atorvastatin (LIPITOR) 10 MG tablet TAKE 1 TABLET (10 MG TOTAL) BY MOUTH DAILY. 04/28/20 04/28/21  Ladell Pier, MD  benzonatate (TESSALON) 100 MG capsule Take 1 capsule (100 mg total) by mouth 2 (two) times daily as needed for cough. 10/09/20   Charlott Rakes, MD  fluticasone furoate-vilanterol (BREO ELLIPTA) 200-25 MCG/INH AEPB Inhale 1 puff into the lungs daily. 08/27/20   Ladell Pier, MD  naloxone Karma Greaser) 2 MG/2ML injection Inject if unconscious or not breathing Patient not taking: Reported on 08/27/2020 08/21/20   Pearson Grippe, DO    Allergies    Patient has  no known allergies.  Review of Systems   Review of Systems  Constitutional:  Negative for activity change, chills and fever.  Respiratory:  Positive for shortness of breath and wheezing. Negative for chest tightness.   Cardiovascular:  Negative for chest pain, palpitations and leg swelling.  Gastrointestinal:  Negative for nausea.  All other systems reviewed and are negative.  Physical Exam Updated Vital Signs BP (!) 149/98   Pulse 77   Temp 99.3 F (37.4 C) (Oral)   Resp 19   SpO2 98%   Physical Exam Vitals and nursing note reviewed.  Constitutional:      General: He is not in acute distress.    Appearance: Normal appearance. He is well-developed. He is not ill-appearing.  HENT:     Head: Normocephalic and  atraumatic.     Nose: Nose normal.  Eyes:     General: No scleral icterus.    Extraocular Movements: Extraocular movements intact.     Conjunctiva/sclera: Conjunctivae normal.  Cardiovascular:     Rate and Rhythm: Normal rate and regular rhythm.     Pulses: Normal pulses.     Heart sounds: Normal heart sounds.  Pulmonary:     Effort: Pulmonary effort is normal. No tachypnea, accessory muscle usage or respiratory distress.     Breath sounds: Decreased breath sounds (diffuse) and wheezing (faint, expiratory) present. No rales.  Abdominal:     General: There is no distension.     Tenderness: There is no abdominal tenderness.  Musculoskeletal:        General: Normal range of motion.     Cervical back: Normal range of motion.     Right lower leg: No edema.     Left lower leg: No edema.  Skin:    General: Skin is warm and dry.  Neurological:     General: No focal deficit present.     Mental Status: He is alert. Mental status is at baseline.    ED Results / Procedures / Treatments   Labs (all labs ordered are listed, but only abnormal results are displayed) Labs Reviewed  BASIC METABOLIC PANEL - Abnormal; Notable for the following components:      Result Value   Glucose, Bld 107 (*)    BUN 7 (*)    All other components within normal limits  CBC WITH DIFFERENTIAL/PLATELET - Abnormal; Notable for the following components:   RBC 4.19 (*)    All other components within normal limits  BRAIN NATRIURETIC PEPTIDE    EKG EKG Interpretation  Date/Time:  Monday December 21 2020 11:38:12 EDT Ventricular Rate:  83 PR Interval:  132 QRS Duration: 74 QT Interval:  376 QTC Calculation: 441 R Axis:   88 Text Interpretation: Normal sinus rhythm Right atrial enlargement Borderline ECG Confirmed by Noemi Chapel [id number]) on 12/21/2020 6:52:27 PM  Radiology DG Chest 2 View  Result Date: 12/21/2020 CLINICAL DATA:  Short of breath and cough since early this morning. EXAM: CHEST - 2 VIEW  COMPARISON:  08/21/2020 FINDINGS: Cardiac silhouette is normal in size. No mediastinal or hilar masses or evidence of adenopathy. Lungs are mildly hyperexpanded, but clear. No pleural effusion or pneumothorax. Skeletal structures are intact. IMPRESSION: No active cardiopulmonary disease. Electronically Signed   By: Lajean Manes M.D.   On: 12/21/2020 13:12    Procedures Procedures   Medications Ordered in ED Medications  ipratropium-albuterol (DUONEB) 0.5-2.5 (3) MG/3ML nebulizer solution 3 mL (has no administration in time range)    ED Course  I have reviewed the triage vital signs and the nursing notes.  Pertinent labs & imaging results that were available during my care of the patient were reviewed by me and considered in my medical decision making (see chart for details).    MDM Rules/Calculators/A&P                           65 year old male presents today for evaluation of shortness of breath of 1 week duration.  On exam he is satting 98%, speaking in full sentences.  He is without signs of volume overload.  Has a history of COPD, asthma and has been out of his inhalers.  Chest x-ray is without acute cardiopulmonary findings.  Patient given MDI albuterol doses here.  He is appropriate for discharge.  Sent in prescription for prednisone and Breo Ellipta.  Final Clinical Impression(s) / ED Diagnoses Final diagnoses:  None    Rx / DC Orders ED Discharge Orders     None        Evlyn Courier, PA-C 12/21/20 1956    Noemi Chapel, MD 12/23/20 1141

## 2020-12-21 NOTE — Discharge Instructions (Addendum)
You have a COPD exacerbation.  Take albuterol as needed.  Take prednisone as directed.  Your Breo Ellipta inhaler is also been sent into your pharmacy.  Follow-up with your primary care provider.

## 2020-12-21 NOTE — ED Provider Notes (Signed)
This patient is a very pleasant 65 year old male known history of reactive airway disease and emphysema presenting with shortness of breath starting approximately 1 week ago, persistent, gradually worsening, associated with only the occasional cough and no fevers or significant phlegm production.  No edema of the legs on exam, lungs are bilaterally diminished but he speaks in full sentences with an oxygen of 98% on room air.  Pulse of 77, normal pulses at the radial arteries, no JVD or peripheral edema.  Labs are unremarkable without leukocytosis or metabolic abnormalities, BMP is normal, chest x-ray is normal without any signs of infiltrate.  At this time patient stable for discharge with albuterol MDI prednisone and follow-up with family doctor who he states he can see within the next week  Medical screening examination/treatment/procedure(s) were conducted as a shared visit with non-physician practitioner(s) and myself.  I personally evaluated the patient during the encounter.  Clinical Impression:   Final diagnoses:  Centrilobular emphysema ([place])         Noemi Chapel, MD 12/23/20 1140

## 2020-12-21 NOTE — ED Triage Notes (Signed)
Patient here for evaluation of shortness of breath and intermittent left shoulder pain. Patient speaking in complete sentences and is alert, oriented, ambulatory, and in no apparent distress at this time.

## 2020-12-21 NOTE — ED Notes (Signed)
Pt is very upset about wait time refusing vitals

## 2020-12-21 NOTE — ED Provider Notes (Signed)
Emergency Medicine Provider Triage Evaluation Note  Jason Wong , a 65 y.o. male  was evaluated in triage.  Pt complains of shortness of breath.  States that symptoms began yesterday after smoking several cigarettes and drinking alcohol.  Has a history of asthma, tried his inhaler for relief of symptoms without success.  However he believes his inhaler might be empty.  Review of Systems  Positive: Shortness of breath Negative: Fevers, chills, chest pain, leg pain or swelling, nausea, vomiting, diarrhea  Physical Exam  BP 130/87 (BP Location: Left Arm)   Pulse 84   Temp 99.3 F (37.4 C) (Oral)   Resp 16   SpO2 96%  Gen:   Awake, no distress  Resp:  Normal effort, wheezing noted in upper lung fields MSK:   Moves extremities without difficulty, no edema in legs   Medical Decision Making  Medically screening exam initiated at 11:55 AM.  Appropriate orders placed.  Jason Wong was informed that the remainder of the evaluation will be completed by another provider, this initial triage assessment does not replace that evaluation, and the importance of remaining in the ED until their evaluation is complete.     Jason Wong 12/21/20 1157    Jason Chapel, MD 12/21/20 [phone]

## 2020-12-22 ENCOUNTER — Other Ambulatory Visit: Payer: Self-pay

## 2020-12-29 ENCOUNTER — Other Ambulatory Visit: Payer: Self-pay

## 2020-12-31 ENCOUNTER — Ambulatory Visit: Payer: Medicare Other | Admitting: Internal Medicine

## 2021-02-18 ENCOUNTER — Other Ambulatory Visit: Payer: Self-pay

## 2021-02-18 ENCOUNTER — Encounter (HOSPITAL_COMMUNITY): Payer: Self-pay | Admitting: Student in an Organized Health Care Education/Training Program

## 2021-02-18 ENCOUNTER — Inpatient Hospital Stay (HOSPITAL_COMMUNITY)
Admission: EM | Admit: 2021-02-18 | Discharge: 2021-02-22 | DRG: 190 | Disposition: A | Discharge status: Home / Self Care | Payer: Medicare Other | Attending: Student in an Organized Health Care Education/Training Program | Admitting: Student in an Organized Health Care Education/Training Program

## 2021-02-18 ENCOUNTER — Emergency Department (HOSPITAL_COMMUNITY): Payer: Medicare Other

## 2021-02-18 DIAGNOSIS — Z20822 Contact with and (suspected) exposure to covid-19: Secondary | ICD-10-CM | POA: Diagnosis present

## 2021-02-18 DIAGNOSIS — Z681 Body mass index (BMI) 19 or less, adult: Secondary | ICD-10-CM

## 2021-02-18 DIAGNOSIS — J439 Emphysema, unspecified: Principal | ICD-10-CM | POA: Diagnosis present

## 2021-02-18 DIAGNOSIS — I248 Other forms of acute ischemic heart disease: Secondary | ICD-10-CM | POA: Diagnosis present

## 2021-02-18 DIAGNOSIS — J9621 Acute and chronic respiratory failure with hypoxia: Secondary | ICD-10-CM | POA: Diagnosis present

## 2021-02-18 DIAGNOSIS — T461X6A Underdosing of calcium-channel blockers, initial encounter: Secondary | ICD-10-CM | POA: Diagnosis present

## 2021-02-18 DIAGNOSIS — I10 Essential (primary) hypertension: Secondary | ICD-10-CM | POA: Diagnosis present

## 2021-02-18 DIAGNOSIS — J4540 Moderate persistent asthma, uncomplicated: Secondary | ICD-10-CM | POA: Diagnosis present

## 2021-02-18 DIAGNOSIS — Z716 Tobacco abuse counseling: Secondary | ICD-10-CM

## 2021-02-18 DIAGNOSIS — F17210 Nicotine dependence, cigarettes, uncomplicated: Secondary | ICD-10-CM | POA: Diagnosis present

## 2021-02-18 DIAGNOSIS — Z91120 Patient's intentional underdosing of medication regimen due to financial hardship: Secondary | ICD-10-CM

## 2021-02-18 DIAGNOSIS — Z800 Family history of malignant neoplasm of digestive organs: Secondary | ICD-10-CM | POA: Diagnosis not present

## 2021-02-18 DIAGNOSIS — T380X6A Underdosing of glucocorticoids and synthetic analogues, initial encounter: Secondary | ICD-10-CM | POA: Diagnosis present

## 2021-02-18 DIAGNOSIS — R0603 Acute respiratory distress: Secondary | ICD-10-CM | POA: Diagnosis present

## 2021-02-18 DIAGNOSIS — Z803 Family history of malignant neoplasm of breast: Secondary | ICD-10-CM

## 2021-02-18 DIAGNOSIS — F17200 Nicotine dependence, unspecified, uncomplicated: Secondary | ICD-10-CM | POA: Diagnosis present

## 2021-02-18 DIAGNOSIS — Z23 Encounter for immunization: Secondary | ICD-10-CM | POA: Diagnosis present

## 2021-02-18 DIAGNOSIS — Z8249 Family history of ischemic heart disease and other diseases of the circulatory system: Secondary | ICD-10-CM | POA: Diagnosis not present

## 2021-02-18 DIAGNOSIS — J4541 Moderate persistent asthma with (acute) exacerbation: Secondary | ICD-10-CM | POA: Diagnosis present

## 2021-02-18 DIAGNOSIS — Z79899 Other long term (current) drug therapy: Secondary | ICD-10-CM

## 2021-02-18 DIAGNOSIS — J441 Chronic obstructive pulmonary disease with (acute) exacerbation: Secondary | ICD-10-CM | POA: Diagnosis not present

## 2021-02-18 DIAGNOSIS — Z836 Family history of other diseases of the respiratory system: Secondary | ICD-10-CM

## 2021-02-18 DIAGNOSIS — T466X6A Underdosing of antihyperlipidemic and antiarteriosclerotic drugs, initial encounter: Secondary | ICD-10-CM | POA: Diagnosis present

## 2021-02-18 DIAGNOSIS — R0602 Shortness of breath: Secondary | ICD-10-CM | POA: Diagnosis not present

## 2021-02-18 DIAGNOSIS — R0689 Other abnormalities of breathing: Secondary | ICD-10-CM | POA: Diagnosis not present

## 2021-02-18 DIAGNOSIS — R64 Cachexia: Secondary | ICD-10-CM | POA: Diagnosis present

## 2021-02-18 DIAGNOSIS — J45909 Unspecified asthma, uncomplicated: Secondary | ICD-10-CM | POA: Diagnosis present

## 2021-02-18 DIAGNOSIS — E441 Mild protein-calorie malnutrition: Secondary | ICD-10-CM | POA: Diagnosis present

## 2021-02-18 DIAGNOSIS — R6889 Other general symptoms and signs: Secondary | ICD-10-CM | POA: Diagnosis not present

## 2021-02-18 DIAGNOSIS — Z743 Need for continuous supervision: Secondary | ICD-10-CM | POA: Diagnosis not present

## 2021-02-18 LAB — I-STAT VENOUS BLOOD GAS, ED
Acid-base deficit: 3 mmol/L — ABNORMAL HIGH (ref 0.0–2.0)
Bicarbonate: 23.4 mmol/L (ref 20.0–28.0)
Calcium, Ion: 1.11 mmol/L — ABNORMAL LOW (ref 1.15–1.40)
HCT: 49 % (ref 39.0–52.0)
Hemoglobin: 16.7 g/dL (ref 13.0–17.0)
O2 Saturation: 99 %
Potassium: 4.1 mmol/L (ref 3.5–5.1)
Sodium: 140 mmol/L (ref 135–145)
TCO2: 25 mmol/L (ref 22–32)
pCO2, Ven: 44.6 mm[Hg] (ref 44.0–60.0)
pH, Ven: 7.328 (ref 7.250–7.430)
pO2, Ven: 170 mm[Hg] — ABNORMAL HIGH (ref 32.0–45.0)

## 2021-02-18 LAB — CBC
HCT: 46.4 % (ref 39.0–52.0)
Hemoglobin: 15.7 g/dL (ref 13.0–17.0)
MCH: 32.6 pg (ref 26.0–34.0)
MCHC: 33.8 g/dL (ref 30.0–36.0)
MCV: 96.5 fL (ref 80.0–100.0)
Platelets: 274 10*3/uL (ref 150–400)
RBC: 4.81 MIL/uL (ref 4.22–5.81)
RDW: 13.6 % (ref 11.5–15.5)
WBC: 9 10*3/uL (ref 4.0–10.5)
nRBC: 0 % (ref 0.0–0.2)

## 2021-02-18 LAB — CREATININE, SERUM
Creatinine, Ser: 0.96 mg/dL (ref 0.61–1.24)
GFR, Estimated: >60 mL/min (ref >60)

## 2021-02-18 LAB — BASIC METABOLIC PANEL
Anion gap: 15 (ref 5–15)
BUN: 14 mg/dL (ref 8–23)
CO2: 18 mmol/L — ABNORMAL LOW (ref 22–32)
Calcium: 9.5 mg/dL (ref 8.9–10.3)
Chloride: 107 mmol/L (ref 98–111)
Creatinine, Ser: 0.99 mg/dL (ref 0.61–1.24)
GFR, Estimated: >60 mL/min (ref >60)
Glucose, Bld: 160 mg/dL — ABNORMAL HIGH (ref 70–99)
Potassium: 3.8 mmol/L (ref 3.5–5.1)
Sodium: 140 mmol/L (ref 135–145)

## 2021-02-18 LAB — TROPONIN I (HIGH SENSITIVITY)
Troponin I (High Sensitivity): 8 ng/L (ref <18)
Troponin I (High Sensitivity): 218 ng/L (ref <18)
Troponin I (High Sensitivity): 302 ng/L (ref <18)

## 2021-02-18 LAB — CBC WITH DIFFERENTIAL/PLATELET
Abs Immature Granulocytes: 0.01 10*3/uL (ref 0.00–0.07)
Basophils Absolute: 0.1 10*3/uL (ref 0.0–0.1)
Basophils Relative: 1 %
Eosinophils Absolute: 0.5 10*3/uL (ref 0.0–0.5)
Eosinophils Relative: 7 %
HCT: 48.7 % (ref 39.0–52.0)
Hemoglobin: 16.4 g/dL (ref 13.0–17.0)
Immature Granulocytes: 0 %
Lymphocytes Relative: 55 %
Lymphs Abs: 4.2 10*3/uL — ABNORMAL HIGH (ref 0.7–4.0)
MCH: 33.3 pg (ref 26.0–34.0)
MCHC: 33.7 g/dL (ref 30.0–36.0)
MCV: 98.8 fL (ref 80.0–100.0)
Monocytes Absolute: 0.7 10*3/uL (ref 0.1–1.0)
Monocytes Relative: 9 %
Neutro Abs: 2.1 10*3/uL (ref 1.7–7.7)
Neutrophils Relative %: 28 %
Platelets: 288 10*3/uL (ref 150–400)
RBC: 4.93 MIL/uL (ref 4.22–5.81)
RDW: 13.6 % (ref 11.5–15.5)
WBC: 7.6 10*3/uL (ref 4.0–10.5)
nRBC: 0 % (ref 0.0–0.2)

## 2021-02-18 LAB — RESP PANEL BY RT-PCR (FLU A&B, COVID) ARPGX2
Influenza A by PCR: NEGATIVE
Influenza B by PCR: NEGATIVE
SARS Coronavirus 2 by RT PCR: NEGATIVE

## 2021-02-18 LAB — HIV ANTIBODY (ROUTINE TESTING W REFLEX): HIV Screen 4th Generation wRfx: NONREACTIVE

## 2021-02-18 LAB — BRAIN NATRIURETIC PEPTIDE: B Natriuretic Peptide: 69.9 pg/mL (ref 0.0–100.0)

## 2021-02-18 MED ORDER — ENALAPRILAT 1.25 MG/ML IV SOLN
1.2500 mg | Freq: Once | INTRAVENOUS | Status: AC
  Administered 2021-02-18: 1.25 mg via INTRAVENOUS
  Filled 2021-02-18: qty 1

## 2021-02-18 MED ORDER — PREDNISONE 20 MG PO TABS
40.0000 mg | ORAL_TABLET | Freq: Every day | ORAL | Status: DC
  Administered 2021-02-19 – 2021-02-22 (×4): 40 mg via ORAL
  Filled 2021-02-18 (×4): qty 2

## 2021-02-18 MED ORDER — ATORVASTATIN CALCIUM 10 MG PO TABS
10.0000 mg | ORAL_TABLET | Freq: Every day | ORAL | Status: DC
  Administered 2021-02-18 – 2021-02-22 (×5): 10 mg via ORAL
  Filled 2021-02-18 (×5): qty 1

## 2021-02-18 MED ORDER — FLUTICASONE FUROATE-VILANTEROL 200-25 MCG/INH IN AEPB: 1.0000 | INHALATION_SPRAY | Freq: Every day | RESPIRATORY_TRACT | Status: DC

## 2021-02-18 MED ORDER — AMLODIPINE BESYLATE 5 MG PO TABS
5.0000 mg | ORAL_TABLET | Freq: Every day | ORAL | Status: DC
  Administered 2021-02-19 – 2021-02-22 (×4): 5 mg via ORAL
  Filled 2021-02-18 (×4): qty 1

## 2021-02-18 MED ORDER — IPRATROPIUM-ALBUTEROL 0.5-2.5 (3) MG/3ML IN SOLN
3.0000 mL | Freq: Four times a day (QID) | RESPIRATORY_TRACT | Status: DC
  Administered 2021-02-18 – 2021-02-19 (×3): 3 mL via RESPIRATORY_TRACT
  Filled 2021-02-18 (×4): qty 3

## 2021-02-18 MED ORDER — FLUTICASONE FUROATE-VILANTEROL 200-25 MCG/ACT IN AEPB
1.0000 | INHALATION_SPRAY | Freq: Every day | RESPIRATORY_TRACT | Status: DC
  Administered 2021-02-19 – 2021-02-22 (×4): 1 via RESPIRATORY_TRACT
  Filled 2021-02-18: qty 28

## 2021-02-18 MED ORDER — ASPIRIN 325 MG PO TABS
325.0000 mg | ORAL_TABLET | Freq: Every day | ORAL | Status: DC
  Administered 2021-02-18 – 2021-02-22 (×5): 325 mg via ORAL
  Filled 2021-02-18 (×5): qty 1

## 2021-02-18 MED ORDER — ENOXAPARIN SODIUM 40 MG/0.4ML IJ SOSY
40.0000 mg | PREFILLED_SYRINGE | INTRAMUSCULAR | Status: DC
  Administered 2021-02-18 – 2021-02-21 (×4): 40 mg via SUBCUTANEOUS
  Filled 2021-02-18 (×4): qty 0.4

## 2021-02-18 MED ORDER — AMLODIPINE BESYLATE 5 MG PO TABS
10.0000 mg | ORAL_TABLET | Freq: Every day | ORAL | Status: DC
  Filled 2021-02-18: qty 2

## 2021-02-18 MED ORDER — NITROGLYCERIN 0.4 MG SL SUBL
0.4000 mg | SUBLINGUAL_TABLET | SUBLINGUAL | Status: DC | PRN
  Filled 2021-02-18: qty 1

## 2021-02-18 MED ORDER — ALBUTEROL SULFATE (2.5 MG/3ML) 0.083% IN NEBU
20.0000 mg/h | INHALATION_SOLUTION | Freq: Once | RESPIRATORY_TRACT | Status: AC
  Administered 2021-02-18: 20 mg/h via RESPIRATORY_TRACT
  Filled 2021-02-18: qty 3

## 2021-02-18 MED ORDER — LOSARTAN POTASSIUM 25 MG PO TABS
25.0000 mg | ORAL_TABLET | Freq: Every day | ORAL | Status: DC
  Administered 2021-02-18 – 2021-02-21 (×4): 25 mg via ORAL
  Filled 2021-02-18 (×4): qty 1

## 2021-02-18 NOTE — H&P (Addendum)
Date: 02/18/2021               Patient Name:  Jason Wong MRN: [id number]  DOB: Mar 26, 1955 Age / Sex: 65 y.o., male   PCP: Ladell Pier, MD         Medical Service: Internal Medicine Teaching Service         Attending Physician: Dr. Evette Doffing, Mallie Mussel, *    First Contact: Scarlett Presto, MD Pager: AD [phone]  Second Contact: Iona Beard, MD Pager: Governor Rooks [phone]       After Hours (After 5p/  First Contact Pager: [phone]  weekends / holidays): Second Contact Pager: [phone]   SUBJECTIVE   Chief Complaint: Shortness of breath  History of Present Illness:   Jason Wong is a 65 y.o. M with a PMH of HTN, Asthma, HCV s/p treatment, and emphysema who was brought to the ED via EMS for shortness of breath and trouble breathing. Patient says that he has run out of all of his medications and hasn't had his breathing treatments in several months. He says that he doesn't have any appointment with his primary care doctor until January. When asked why he hasn't made a sooner appointment he said that he did have an appointment but he did not go to it. He normally gets his inhalers for free but he had trouble getting them and has not been able to afford them. He has been having progressive dyspnea on exertion and having progressively more trouble breathing since running out of his medications. He did go to the ED in October for this and they gave him in inhaler but he lost it. He says that his breathing has worsened over the last week causing him to often wake up short of breath, but he denies orthopnea. Today his breathing acutely deteriorated and his family called EMS. He says that he would have trouble breathing right now walking from here to the door. He denies any recent illnesses such as fever, chills, sore throat, congestion, or cough. Other than his progressive shortness of breath he has felt well.  Meds:   No current facility-administered medications on file prior to encounter.    Current Outpatient Medications on File Prior to Encounter  Medication Sig Dispense Refill   albuterol (VENTOLIN HFA) 108 (90 Base) MCG/ACT inhaler INHALE 2 PUFFS INTO THE LUNGS EVERY 6 (SIX) HOURS AS NEEDED FOR WHEEZING OR SHORTNESS OF BREATH. 8.5 g 2   amLODipine (NORVASC) 5 MG tablet TAKE 1 TABLET (5 MG TOTAL) BY MOUTH DAILY. 90 tablet 2   atorvastatin (LIPITOR) 10 MG tablet TAKE 1 TABLET (10 MG TOTAL) BY MOUTH DAILY. 90 tablet 3   benzonatate (TESSALON) 100 MG capsule Take 1 capsule (100 mg total) by mouth 2 (two) times daily as needed for cough. 20 capsule 0   fluticasone furoate-vilanterol (BREO ELLIPTA) 200-25 MCG/INH AEPB Inhale 1 puff into the lungs daily. 60 each 11   naloxone (NARCAN) 2 MG/2ML injection Inject if unconscious or not breathing (Patient not taking: Reported on 08/27/2020) 2 mL 0     Past Medical History:  Diagnosis Date   Asthma    Hepatitis C antibody positive in blood    HTN (hypertension)     Past Surgical History:  Procedure Laterality Date   INCISE AND DRAIN ABCESS     dog bite    Social:  Lives With: brother and sister Occupation: yard work, impacted by his shortness of breath Support: family Level of Function: independent in ADLs PCP:  [facility] Substances: everyday smoker, 3 or so cigarette  Family History:  Family History  Problem Relation Age of Onset   Hypertension Mother    Breast cancer Sister    Hypertension Sister    Colon cancer Brother    Colon polyps Neg Hx    Esophageal cancer Neg Hx    Rectal cancer Neg Hx    Stomach cancer Neg Hx    No history of lung cancer in the family, brother with some type of lung disease using a "breathing machine"  Allergies: Allergies as of 02/18/2021   (No Known Allergies)    Review of Systems: A complete ROS was negative except as per HPI.   OBJECTIVE:   Physical Exam: Blood pressure (!) 160/98, pulse 87, temperature (!) 97.5 F (36.4 C), temperature source Axillary,  resp. rate 18, height 5\' 9"  (1.753 m), weight 54.4 kg, SpO2 97 %.  Constitutional: Cachectic appearing elderly male resting comfortably in bed, in no acute distress Cardiovascular: regular rate and rhythm, no m/r/g Pulmonary/Chest: normal work of breathing 5L [place], diffuse expiratory wheezing Abd: flat, soft, nontender Neurological: alert & oriented x 3, answering questions appropriately Skin: warm and dry Psych: normal affect  Labs: CBC    Component Value Date/Time   WBC 7.6 02/18/2021 1345   RBC 4.93 02/18/2021 1345   HGB 16.7 02/18/2021 1415   HGB 17.2 09/06/2019 1053   HCT 49.0 02/18/2021 1415   HCT 49.9 09/06/2019 1053   PLT 288 02/18/2021 1345   PLT 274 09/06/2019 1053   MCV 98.8 02/18/2021 1345   MCV 98 (H) 09/06/2019 1053   MCH 33.3 02/18/2021 1345   MCHC 33.7 02/18/2021 1345   RDW 13.6 02/18/2021 1345   RDW 11.5 (L) 09/06/2019 1053   LYMPHSABS 4.2 (H) 02/18/2021 1345   LYMPHSABS 2.5 06/13/2016 1110   MONOABS 0.7 02/18/2021 1345   EOSABS 0.5 02/18/2021 1345   EOSABS 0.1 06/13/2016 1110   BASOSABS 0.1 02/18/2021 1345   BASOSABS 0.0 06/13/2016 1110     CMP     Component Value Date/Time   NA 140 02/18/2021 1415   NA 144 01/07/2020 0934   K 4.1 02/18/2021 1415   CL 107 02/18/2021 1345   CO2 18 (L) 02/18/2021 1345   GLUCOSE 160 (H) 02/18/2021 1345   BUN 14 02/18/2021 1345   BUN 14 01/07/2020 0934   CREATININE 0.99 02/18/2021 1345   CREATININE 1.18 10/03/2017 1037   CALCIUM 9.5 02/18/2021 1345   PROT 7.8 08/20/2020 2107   PROT 8.2 09/06/2019 1053   ALBUMIN 4.4 08/20/2020 2107   ALBUMIN 4.9 (H) 09/06/2019 1053   AST 23 08/20/2020 2107   ALT 14 08/20/2020 2107   ALKPHOS 77 08/20/2020 2107   BILITOT 0.8 08/20/2020 2107   BILITOT 0.4 09/06/2019 1053   GFRNONAA >60 02/18/2021 1345   GFRAA 66 01/07/2020 0934    Imaging: DG Chest Port 1 View  Result Date: 02/18/2021 CLINICAL DATA:  Shortness of breath. EXAM: PORTABLE CHEST 1 VIEW COMPARISON:  Prior chest  radiographs 12/21/2020 and earlier. FINDINGS: Heart size within normal limits. No appreciable airspace consolidation or pulmonary edema. No evidence of pleural effusion or pneumothorax. No acute bony abnormality identified. IMPRESSION: No evidence of active cardiopulmonary disease. Electronically Signed   By: Kellie Simmering D.O.   On: 02/18/2021 14:32    EKG: personally reviewed my interpretation is sinus rhythm, normal axis, normal interval   ASSESSMENT & PLAN:    Assessment & Plan by Problem:  Achilles Neville is a 65 y.o. with PMH of HTN, Asthma, HCV s/p treatment, and emphysema presenting with shortness of breath admitted for a COPD exacerbation.  #COPD exacerbation #Hx of asthma Patient coming in with what is likely a COPD exacerbation in the setting of medication nonadherence due to running out of medications. Patient diffusely wheezing on exam, initially requiring Bipap in the ED. WBC is WNL, patient is afebrile, and has no signs of infection such as cough or increased sputum production. CXR did not show active cardiopulmonary disease. Low suspicion for infection at this time. Breathing has significantly improved since presentation, saturating well on 5 L [place] with normal WOB. - s/p 125 solumedrol - start 40 mg prednisone for 5 days - breo ellipta, duonebs - wean oxygen as able - will need PFTs as an outpatient  #Elevated Troponin Initial troponin normal at 8, second elevated at 218, suspect this is demand ischemia but will continue to trend troponins. Patient is not complaining of any chest pain. Initial EKG had a lot of artifact, will repeat this again. - aspirin 325, continue atorvastatin - telemetry - f/u repeat EKG - f/u troponins - nitroglycerin PRN for chest pain  #HTN Patient prescribed amlodipine in the past though is not taking this right now after running out of his medications. BP high to the 240s on arrival, received IV enalapril but this has improved to the 140s. Given  patient's elevated troponins will work to better control patient's BP.  -losartan 25, amlodipine 10   Diet: Normal VTE: Enoxaparin IVF: None,None Code: Full  Prior to Admission Living Arrangement: Home, living with family Anticipated Discharge Location: Home Barriers to Discharge: Medical management of COPD exacerbation  Dispo: Admit patient to Observation with expected length of stay less than 2 midnights.  Signed: Scarlett Presto, MD Internal Medicine Resident PGY-1 Pager: [phone]  02/18/2021, 3:54 PM

## 2021-02-18 NOTE — ED Notes (Signed)
Remo Lipps step brother [phone] requesting to speak to the patient

## 2021-02-18 NOTE — ED Notes (Signed)
RT is coming to give breathing tx as pt is on bipap.

## 2021-02-18 NOTE — Hospital Course (Addendum)
Patient reports feeling more short of breath for the past week. Associated with ambulating to the bathroom and up the stairs.He has been having difficulty breathing at home. Does not use oxygen at home. States a similar episode happened about 2 months ago. State he missed his blood pressure pill.    He has been out of his home medications for the past 3-4 months. Next appointment on 1/3 and missed last appointment. Unable to afford his inhalers and has been working with his clinic with this but has not had his inhalers in months. Had been using inhalers about twice daily prior to running out. Especially when doing yard work.  He smokes 3 cigarettes a day. Used to smoke 1 ppd for 20+ years. Drinks 2 25 oz. beers every other week.  Denies any other drug use.   Denies dysuria, bowel and bladder incontinence.    Brother with bronchitis and other breathing issues No history of lung cancer or diabetes  Lives with his sister and brother. Independent of ADLs.   Does yard work

## 2021-02-18 NOTE — ED Notes (Signed)
Titrated to 3LPM [place]. O2 at 97% on the monitor.

## 2021-02-18 NOTE — ED Triage Notes (Signed)
SOB and fluid retention problem. BP was 240/120. Pt was on a bipap en route to ED. EMS gave 2mg  Mag SO4. 2 breathing treatments with no improvement, and Solumedrol 125mg . Hx of intubation.

## 2021-02-18 NOTE — ED Notes (Signed)
Got patient undressed into a gown on the monitor did ekg shown to Dr Tyrone Nine patient is resting with nurse at bedside and call bell in reach

## 2021-02-18 NOTE — ED Provider Notes (Signed)
[facility] EMERGENCY DEPARTMENT Provider Note   CSN: [id number] Arrival date & time: 02/18/21  1330     History Chief Complaint  Patient presents with   Shortness of Breath    Jason Wong is a 65 y.o. male.  65 yo M with a chief complaints of shortness of breath.  This been going on for about a week.  Denies significant cough or fever.  Denies any chest pain or pressure.  Thinks it feels like problems he had with his lungs before.  He is not sure exactly what he was diagnosed with.  He was found to be significantly tachypneic and was placed on nonrebreather by fire and upon EMS arrival they placed him on CPAP.  Was given Solu-Medrol magnesium and a DuoNeb with some improvement.  The history is provided by the patient.  Shortness of Breath Severity:  Severe Onset quality:  Gradual Duration:  1 week Timing:  Constant Progression:  Worsening Chronicity:  Recurrent Relieved by:  Nothing Worsened by:  Nothing Ineffective treatments:  None tried Associated symptoms: cough   Associated symptoms: no abdominal pain, no chest pain, no fever, no headaches, no rash and no vomiting       Past Medical History:  Diagnosis Date   Asthma    Hepatitis C antibody positive in blood    HTN (hypertension)     Patient Active Problem List   Diagnosis Date Noted   Memory deficit 04/28/2020   Hepatitis C virus infection cured after antiviral drug therapy 04/27/2018   Enuresis 04/27/2018   Centrilobular emphysema ([place]) 04/27/2018   Seasonal allergic rhinitis 04/27/2018   Tobacco dependence 08/24/2017   Weight loss, unintentional 08/24/2017   Insomnia 06/27/2017   Prediabetes 01/24/2017   Asthma 06/10/2016   Essential hypertension 06/10/2016   Current every day smoker 06/10/2016    Past Surgical History:  Procedure Laterality Date   INCISE AND DRAIN ABCESS     dog bite       Family History  Problem Relation Age of Onset   Hypertension Mother    Breast  cancer Sister    Hypertension Sister    Colon cancer Brother    Colon polyps Neg Hx    Esophageal cancer Neg Hx    Rectal cancer Neg Hx    Stomach cancer Neg Hx     Social History   Tobacco Use   Smoking status: Every Day    Packs/day: 0.50    Years: 45.00    Pack years: 22.50    Types: Cigarettes   Smokeless tobacco: Never  Substance Use Topics   Alcohol use: Yes    Alcohol/week: 14.0 standard drinks    Types: 14 Cans of beer per week   Drug use: Not Currently    Types: "Crack" cocaine    Home Medications Prior to Admission medications   Medication Sig Start Date End Date Taking? Authorizing Provider  albuterol (VENTOLIN HFA) 108 (90 Base) MCG/ACT inhaler INHALE 2 PUFFS INTO THE LUNGS EVERY 6 (SIX) HOURS AS NEEDED FOR WHEEZING OR SHORTNESS OF BREATH. 10/09/20 10/09/21  Ladell Pier, MD  amLODipine (NORVASC) 5 MG tablet TAKE 1 TABLET (5 MG TOTAL) BY MOUTH DAILY. 04/28/20 04/28/21  Ladell Pier, MD  atorvastatin (LIPITOR) 10 MG tablet TAKE 1 TABLET (10 MG TOTAL) BY MOUTH DAILY. 04/28/20 04/28/21  Ladell Pier, MD  benzonatate (TESSALON) 100 MG capsule Take 1 capsule (100 mg total) by mouth 2 (two) times daily as needed for cough.  10/09/20   Charlott Rakes, MD  fluticasone furoate-vilanterol (BREO ELLIPTA) 200-25 MCG/INH AEPB Inhale 1 puff into the lungs daily. 12/21/20   Evlyn Courier, PA-C  naloxone [facility]) 2 MG/2ML injection Inject if unconscious or not breathing Patient not taking: Reported on 08/27/2020 08/21/20   Pearson Grippe, DO    Allergies    Patient has no known allergies.  Review of Systems   Review of Systems  Constitutional:  Negative for chills and fever.  HENT:  Negative for congestion and facial swelling.   Eyes:  Negative for discharge and visual disturbance.  Respiratory:  Positive for cough and shortness of breath.   Cardiovascular:  Negative for chest pain and palpitations.  Gastrointestinal:  Negative for abdominal pain, diarrhea and vomiting.   Musculoskeletal:  Negative for arthralgias and myalgias.  Skin:  Negative for color change and rash.  Neurological:  Negative for tremors, syncope and headaches.  Psychiatric/Behavioral:  Negative for confusion and dysphoric mood.    Physical Exam Updated Vital Signs BP (!) 148/108   Pulse 83   Temp (!) 97.5 F (36.4 C) (Axillary)   Resp 17   Ht 5\' 9"  (1.753 m)   Wt 54.4 kg   SpO2 100%   BMI 17.71 kg/m   Physical Exam Vitals and nursing note reviewed.  Constitutional:      Appearance: He is well-developed.  HENT:     Head: Normocephalic and atraumatic.  Eyes:     Pupils: Pupils are equal, round, and reactive to light.  Neck:     Vascular: No JVD.  Cardiovascular:     Rate and Rhythm: Normal rate and regular rhythm.     Heart sounds: No murmur heard.   No friction rub. No gallop.  Pulmonary:     Effort: No respiratory distress.     Breath sounds: Wheezing present.     Comments: Diffuse wheezes on exam with prolonged expiratory effort.  Tachypnea. Abdominal:     General: There is no distension.     Tenderness: There is no abdominal tenderness. There is no guarding or rebound.  Musculoskeletal:        General: Normal range of motion.     Cervical back: Normal range of motion and neck supple.     Right lower leg: No edema.     Left lower leg: No edema.  Skin:    Coloration: Skin is not pale.     Findings: No rash.  Neurological:     Mental Status: He is alert and oriented to person, place, and time.  Psychiatric:        Behavior: Behavior normal.    ED Results / Procedures / Treatments   Labs (all labs ordered are listed, but only abnormal results are displayed) Labs Reviewed  CBC WITH DIFFERENTIAL/PLATELET - Abnormal; Notable for the following components:      Result Value   Lymphs Abs 4.2 (*)    All other components within normal limits  I-STAT VENOUS BLOOD GAS, ED - Abnormal; Notable for the following components:   pO2, Ven 170.0 (*)    Acid-base  deficit 3.0 (*)    Calcium, Ion 1.11 (*)    All other components within normal limits  RESP PANEL BY RT-PCR (FLU A&B, COVID) ARPGX2  BRAIN NATRIURETIC PEPTIDE  BASIC METABOLIC PANEL  TROPONIN I (HIGH SENSITIVITY)    EKG EKG Interpretation  Date/Time:  Thursday February 18 2021 13:35:55 EST Ventricular Rate:  84 PR Interval:  138 QRS Duration: 95 QT Interval:  321 QTC Calculation: 382 R Axis:   82 Text Interpretation: Sinus rhythm Biatrial enlargement Borderline right axis deviation Repol abnrm suggests ischemia, diffuse leads No significant change since last tracing Confirmed by Deno Etienne [id number]) on 02/18/2021 2:06:30 PM  Radiology DG Chest Port 1 View  Result Date: 02/18/2021 CLINICAL DATA:  Shortness of breath. EXAM: PORTABLE CHEST 1 VIEW COMPARISON:  Prior chest radiographs 12/21/2020 and earlier. FINDINGS: Heart size within normal limits. No appreciable airspace consolidation or pulmonary edema. No evidence of pleural effusion or pneumothorax. No acute bony abnormality identified. IMPRESSION: No evidence of active cardiopulmonary disease. Electronically Signed   By: Kellie Simmering D.O.   On: 02/18/2021 14:32    Procedures Procedures   Medications Ordered in ED Medications  albuterol (PROVENTIL) (2.5 MG/3ML) 0.083% nebulizer solution (has no administration in time range)  nitroGLYCERIN (NITROSTAT) SL tablet 0.4 mg (has no administration in time range)  enalaprilat (VASOTEC) injection 1.25 mg (1.25 mg Intravenous Given 02/18/21 1425)    ED Course  I have reviewed the triage vital signs and the nursing notes.  Pertinent labs & imaging results that were available during my care of the patient were reviewed by me and considered in my medical decision making (see chart for details).    MDM Rules/Calculators/A&P                           65 yo M with a chief complaints of shortness of breath.  This is been going on for about a week now.  Found by EMS to be quite tachypneic and  was placed on CPAP.  Patient has had some improvement with magnesium Solu-Medrol and a DuoNeb.  On my exam it seems consistent with an asthma or COPD exacerbation.  We will give a continuous albuterol treatment through BiPAP here.  Chest x-ray blood work reassess.  Patient noted to be significantly hypertensive.  Seems to be noncompliant with his antihypertensive medications.  I feel it is less likely to be acute pulmonary edema but will give nitro and IV ACE inhibitor therapy.  Patient feeling better on reassessment.  Blood pressure has come down a bit with IV enalapril.  Lung sounds with improved aeration.  Will discuss with medicine for admission.  CRITICAL CARE Performed by: Cecilio Asper   Total critical care time: 35 minutes  Critical care time was exclusive of separately billable procedures and treating other patients.  Critical care was necessary to treat or prevent imminent or life-threatening deterioration.  Critical care was time spent personally by me on the following activities: development of treatment plan with patient and/or surrogate as well as nursing, discussions with consultants, evaluation of patient's response to treatment, examination of patient, obtaining history from patient or surrogate, ordering and performing treatments and interventions, ordering and review of laboratory studies, ordering and review of radiographic studies, pulse oximetry and re-evaluation of patient's condition.  The patients results and plan were reviewed and discussed.   Any x-rays performed were independently reviewed by myself.   Differential diagnosis were considered with the presenting HPI.  Medications  albuterol (PROVENTIL) (2.5 MG/3ML) 0.083% nebulizer solution (has no administration in time range)  nitroGLYCERIN (NITROSTAT) SL tablet 0.4 mg (has no administration in time range)  enalaprilat (VASOTEC) injection 1.25 mg (1.25 mg Intravenous Given 02/18/21 1425)    Vitals:    02/18/21 1333 02/18/21 1336 02/18/21 1400 02/18/21 1430  BP: (!) 173/120  (!) 132/115 (!) 148/108  Pulse: 87  81 83  Resp: (!) 23  20 17   Temp: (!) 97.5 F (36.4 C)     TempSrc: Axillary     SpO2: 100%  100% 100%  Weight:  54.4 kg    Height:  5\' 9"  (1.753 m)      Final diagnoses:  Acute on chronic respiratory failure with hypoxia (HCC)  Moderate persistent asthma with exacerbation    Admission/ observation were discussed with the admitting physician, patient and/or family and they are comfortable with the plan.    Final Clinical Impression(s) / ED Diagnoses Final diagnoses:  Acute on chronic respiratory failure with hypoxia ([place])  Moderate persistent asthma with exacerbation    Rx / DC Orders ED Discharge Orders     None        Deno Etienne, DO 02/18/21 1455

## 2021-02-19 DIAGNOSIS — J439 Emphysema, unspecified: Secondary | ICD-10-CM | POA: Diagnosis present

## 2021-02-19 DIAGNOSIS — J4540 Moderate persistent asthma, uncomplicated: Secondary | ICD-10-CM | POA: Diagnosis present

## 2021-02-19 DIAGNOSIS — Z681 Body mass index (BMI) 19 or less, adult: Secondary | ICD-10-CM | POA: Diagnosis not present

## 2021-02-19 DIAGNOSIS — I248 Other forms of acute ischemic heart disease: Secondary | ICD-10-CM | POA: Diagnosis present

## 2021-02-19 DIAGNOSIS — Z800 Family history of malignant neoplasm of digestive organs: Secondary | ICD-10-CM | POA: Diagnosis not present

## 2021-02-19 DIAGNOSIS — R0603 Acute respiratory distress: Secondary | ICD-10-CM | POA: Diagnosis present

## 2021-02-19 DIAGNOSIS — Z803 Family history of malignant neoplasm of breast: Secondary | ICD-10-CM | POA: Diagnosis not present

## 2021-02-19 DIAGNOSIS — T461X6A Underdosing of calcium-channel blockers, initial encounter: Secondary | ICD-10-CM | POA: Diagnosis present

## 2021-02-19 DIAGNOSIS — Z91120 Patient's intentional underdosing of medication regimen due to financial hardship: Secondary | ICD-10-CM | POA: Diagnosis not present

## 2021-02-19 DIAGNOSIS — Z20822 Contact with and (suspected) exposure to covid-19: Secondary | ICD-10-CM | POA: Diagnosis present

## 2021-02-19 DIAGNOSIS — J4541 Moderate persistent asthma with (acute) exacerbation: Secondary | ICD-10-CM | POA: Diagnosis present

## 2021-02-19 DIAGNOSIS — T380X6A Underdosing of glucocorticoids and synthetic analogues, initial encounter: Secondary | ICD-10-CM | POA: Diagnosis present

## 2021-02-19 DIAGNOSIS — J441 Chronic obstructive pulmonary disease with (acute) exacerbation: Secondary | ICD-10-CM | POA: Diagnosis not present

## 2021-02-19 DIAGNOSIS — Z23 Encounter for immunization: Secondary | ICD-10-CM | POA: Diagnosis present

## 2021-02-19 DIAGNOSIS — F17210 Nicotine dependence, cigarettes, uncomplicated: Secondary | ICD-10-CM | POA: Diagnosis present

## 2021-02-19 DIAGNOSIS — Z8249 Family history of ischemic heart disease and other diseases of the circulatory system: Secondary | ICD-10-CM | POA: Diagnosis not present

## 2021-02-19 DIAGNOSIS — Z79899 Other long term (current) drug therapy: Secondary | ICD-10-CM | POA: Diagnosis not present

## 2021-02-19 DIAGNOSIS — R64 Cachexia: Secondary | ICD-10-CM | POA: Diagnosis present

## 2021-02-19 DIAGNOSIS — I10 Essential (primary) hypertension: Secondary | ICD-10-CM | POA: Diagnosis present

## 2021-02-19 DIAGNOSIS — J9621 Acute and chronic respiratory failure with hypoxia: Secondary | ICD-10-CM | POA: Diagnosis present

## 2021-02-19 DIAGNOSIS — T466X6A Underdosing of antihyperlipidemic and antiarteriosclerotic drugs, initial encounter: Secondary | ICD-10-CM | POA: Diagnosis present

## 2021-02-19 LAB — CBC
HCT: 46.1 % (ref 39.0–52.0)
Hemoglobin: 15.4 g/dL (ref 13.0–17.0)
MCH: 32.2 pg (ref 26.0–34.0)
MCHC: 33.4 g/dL (ref 30.0–36.0)
MCV: 96.2 fL (ref 80.0–100.0)
Platelets: 267 10*3/uL (ref 150–400)
RBC: 4.79 MIL/uL (ref 4.22–5.81)
RDW: 13.7 % (ref 11.5–15.5)
WBC: 6.4 10*3/uL (ref 4.0–10.5)
nRBC: 0 % (ref 0.0–0.2)

## 2021-02-19 LAB — BASIC METABOLIC PANEL
Anion gap: 8 (ref 5–15)
BUN: 17 mg/dL (ref 8–23)
CO2: 24 mmol/L (ref 22–32)
Calcium: 9.9 mg/dL (ref 8.9–10.3)
Chloride: 105 mmol/L (ref 98–111)
Creatinine, Ser: 0.98 mg/dL (ref 0.61–1.24)
GFR, Estimated: >60 mL/min (ref >60)
Glucose, Bld: 113 mg/dL — ABNORMAL HIGH (ref 70–99)
Potassium: 4.3 mmol/L (ref 3.5–5.1)
Sodium: 137 mmol/L (ref 135–145)

## 2021-02-19 LAB — TROPONIN I (HIGH SENSITIVITY)
Troponin I (High Sensitivity): 322 ng/L (ref <18)
Troponin I (High Sensitivity): 282 ng/L (ref <18)

## 2021-02-19 MED ORDER — IPRATROPIUM-ALBUTEROL 0.5-2.5 (3) MG/3ML IN SOLN
3.0000 mL | RESPIRATORY_TRACT | Status: DC
  Administered 2021-02-19 – 2021-02-20 (×8): 3 mL via RESPIRATORY_TRACT
  Filled 2021-02-19 (×8): qty 3

## 2021-02-19 MED ORDER — INFLUENZA VAC A&B SA ADJ QUAD 0.5 ML IM PRSY
0.5000 mL | PREFILLED_SYRINGE | INTRAMUSCULAR | Status: AC
  Administered 2021-02-22: 0.5 mL via INTRAMUSCULAR
  Filled 2021-02-19: qty 0.5

## 2021-02-19 MED ORDER — HYDRALAZINE HCL 10 MG PO TABS
10.0000 mg | ORAL_TABLET | Freq: Once | ORAL | Status: AC
  Administered 2021-02-19: 10 mg via ORAL
  Filled 2021-02-19: qty 1

## 2021-02-19 MED ORDER — DM-GUAIFENESIN ER 30-600 MG PO TB12
1.0000 | ORAL_TABLET | Freq: Two times a day (BID) | ORAL | Status: DC
  Administered 2021-02-19 – 2021-02-22 (×7): 1 via ORAL
  Filled 2021-02-19 (×7): qty 1

## 2021-02-19 MED ORDER — NICOTINE 7 MG/24HR TD PT24
7.0000 mg | MEDICATED_PATCH | Freq: Every day | TRANSDERMAL | Status: DC
  Administered 2021-02-21 – 2021-02-22 (×2): 7 mg via TRANSDERMAL
  Filled 2021-02-19 (×4): qty 1

## 2021-02-19 MED ORDER — ALBUTEROL SULFATE (2.5 MG/3ML) 0.083% IN NEBU
2.5000 mg | INHALATION_SOLUTION | RESPIRATORY_TRACT | Status: DC | PRN
  Administered 2021-02-19 – 2021-02-22 (×2): 2.5 mg via RESPIRATORY_TRACT
  Filled 2021-02-19 (×2): qty 3

## 2021-02-19 NOTE — Evaluation (Signed)
Occupational Therapy Evaluation Patient Details Name: Jason Wong MRN: [id number] DOB: June 17, 1955 Today's Date: 02/19/2021   History of Present Illness Jason Wong is a 65 y.o. male who presented with progressive SOB. Pt reports running out of medications and inhalers. Pt admitted for acute COPD exacerbation. PMH:  HTN, Asthma, COPD, HCV s/p treatment, and emphysema   Clinical Impression   PTA, pt lives with siblings and reports typically Independent in all ADLs, IADLs and mobility. Pt presents now with deficits in cardiopulmonary tolerance and overall endurance. Pt able to stand independently and complete ADLs without physical assist (seated EOB) though limited by continuous coughing, increased work of breathing and fatigue with minimal activity.  Emphasis on breathing techniques, rest breaks and upright positioning for pulmonary health. RN in to assess pt during session. Anticipate once breathing improves, pt will return quickly to PLOF without OT follow-up at DC. Will continue to follow acutely to progress activity tolerance with ADLs and update recs as appropriate.  SpO2 100% on 3 L O2, 3-4/4 DOE BP pre activity: 150/99 HR 90s-102bpm     Recommendations for follow up therapy are one component of a multi-disciplinary discharge planning process, led by the attending physician.  Recommendations may be updated based on patient status, additional functional criteria and insurance authorization.   Follow Up Recommendations  No OT follow up    Assistance Recommended at Discharge Intermittent Supervision/Assistance  Functional Status Assessment  Patient has had a recent decline in their functional status and demonstrates the ability to make significant improvements in function in a reasonable and predictable amount of time.  Equipment Recommendations  Other (comment) (TBD pending progress)    Recommendations for Other Services       Precautions / Restrictions Precautions Precautions:  Fall;Other (comment) Precaution Comments: monitor vitals Restrictions Weight Bearing Restrictions: No      Mobility Bed Mobility Overal bed mobility: Modified Independent                  Transfers Overall transfer level: Independent Equipment used: None               General transfer comment: able to stand EOB for OT to change bed pads      Balance Overall balance assessment: No apparent balance deficits (not formally assessed)                                         ADL either performed or assessed with clinical judgement   ADL Overall ADL's : Needs assistance/impaired Eating/Feeding: Independent;Sitting   Grooming: Set up;Sitting;Wash/dry face Grooming Details (indicate cue type and reason): sitting EOB Upper Body Bathing: Set up;Sitting   Lower Body Bathing: Set up;Sit to/from stand Lower Body Bathing Details (indicate cue type and reason): able to bathe peri region sitting EOB Upper Body Dressing : Set up;Sitting Upper Body Dressing Details (indicate cue type and reason): don clean gown Lower Body Dressing: Set up;Sit to/from stand       Toileting- Water quality scientist and Hygiene: Supervision/safety;Sit to/from stand         General ADL Comments: Pt limited by progressive coughing/SOB with activity completing ADLs seated EOB. Cues for breathing techniques with coughing ceasing. Pt with physical abilities to complete ADLs with limited assist though limited by profuse coughing     Vision Ability to See in Adequate Light: 0 Adequate Patient Visual Report: No change from baseline Vision Assessment?:  No apparent visual deficits     Perception     Praxis      Pertinent Vitals/Pain Pain Assessment: Faces Faces Pain Scale: Hurts a little bit Pain Location: generalized with coughing Pain Descriptors / Indicators: Grimacing;Guarding Pain Intervention(s): Monitored during session;Limited activity within patient's tolerance      Hand Dominance Right   Extremity/Trunk Assessment Upper Extremity Assessment Upper Extremity Assessment: Overall WFL for tasks assessed   Lower Extremity Assessment Lower Extremity Assessment: Defer to PT evaluation   Cervical / Trunk Assessment Cervical / Trunk Assessment: Normal   Communication Communication Communication: No difficulties   Cognition Arousal/Alertness: Awake/alert Behavior During Therapy: WFL for tasks assessed/performed Overall Cognitive Status: Within Functional Limits for tasks assessed                                       General Comments  SpO2 100% on 3 L O2 (with extended O2 cord), HR 90s-100. Woody RN in during session to assess pt and paging MD for breathing treatment    Exercises     Shoulder Instructions      Home Living Family/patient expects to be discharged to:: Private residence Living Arrangements: Other relatives (sister and brother) Available Help at Discharge: Family;Available PRN/intermittently Type of Home: House Home Access: Stairs to enter     Home Layout: Two level;Bed/bath upstairs Alternate Level Stairs-Number of Steps: 12             Home Equipment: None   Additional Comments: reports brother and sister both with health issues; pt had been helping them out      Prior Functioning/Environment Prior Level of Function : Independent/Modified Independent             Mobility Comments: typically no use of AD ADLs Comments: Independent with ADLs, IADLs, assists siblings as need, does yard work and assists with Jason Wong work as needed        OT Problem List: Decreased activity tolerance;Cardiopulmonary status limiting activity;Decreased knowledge of use of DME or AE      OT Treatment/Interventions: Self-care/ADL training;Therapeutic exercise;Energy conservation;DME and/or AE instruction;Therapeutic activities;Patient/family education    OT Goals(Current goals can be found in the care plan  section) Acute Rehab OT Goals Patient Stated Goal: improve breathing OT Goal Formulation: With patient Time For Goal Achievement: 03/05/21 Potential to Achieve Goals: Good  OT Frequency: Min 2X/week   Barriers to D/C:            Co-evaluation              AM-PAC OT "6 Clicks" Daily Activity     Outcome Measure Help from another person eating meals?: None Help from another person taking care of personal grooming?: A Little Help from another person toileting, which includes using toliet, bedpan, or urinal?: A Little Help from another person bathing (including washing, rinsing, drying)?: A Little Help from another person to put on and taking off regular upper body clothing?: A Little Help from another person to put on and taking off regular lower body clothing?: A Little 6 Click Score: 19   End of Session Equipment Utilized During Treatment: Oxygen Nurse Communication: Mobility status;Other (comment) (NT - batjhing, request for urinal use rather than primo fit (provided urinal within pt reach))  Activity Tolerance: Treatment limited secondary to medical complications (Comment) Patient left: in bed;with call bell/phone within reach;with bed alarm set  OT Visit Diagnosis: Other (  comment) (decreased cardiopulmonary tolerance)                Time: 0289-0228 OT Time Calculation (min): 24 min Charges:  OT General Charges $OT Visit: 1 Visit OT Evaluation $OT Eval Low Complexity: 1 Low  Malachy Chamber, OTR/L Acute Rehab Services Office: [phone]   Layla Maw 02/19/2021, 9:10 AM

## 2021-02-19 NOTE — Progress Notes (Signed)
HD#0 Subjective:  Overnight Events: NAEO   Patient says that he is feeling better at rest but is still short of breath when he gets up or tries to exert himself. He knows that he will feel better eventually so he will be here until he gets better.   Objective:  Vital signs in last 24 hours: Vitals:   02/18/21 2227 02/19/21 0104 02/19/21 0306 02/19/21 0654  BP: (!) 166/110 (!) 173/102 (!) 157/111 (!) 169/115  Pulse: 85 64 69 68  Resp: (!) 26 20 20 20   Temp: 98.4 F (36.9 C) 98.3 F (36.8 C) 97.9 F (36.6 C) 98.1 F (36.7 C)  TempSrc: Oral Oral Oral Oral  SpO2: 98% 100% 96% 97%  Weight: 52.8 kg  52.8 kg   Height: 5\' 9"  (1.753 m)      Supplemental O2: Room Air SpO2: 97 % O2 Flow Rate (L/min): 2 L/min   Physical Exam:  Constitutional: Cachectic appearing elderly male resting comfortably in bed, in no acute distress  Cardiovascular: regular rate and rhythm, no m/r/g Pulmonary/Chest: normal work of breathing 3L [place], diffuse expiratory wheezing Abd: flat, soft, nontender Neurological: alert & oriented x 3, answering questions appropriately Skin: warm and dry Psych: normal affect  Filed Weights   02/18/21 1336 02/18/21 2227 02/19/21 0306  Weight: 54.4 kg 52.8 kg 52.8 kg     Intake/Output Summary (Last 24 hours) at 02/19/2021 0711 Last data filed at 02/19/2021 0100 Gross per 24 hour  Intake 222 ml  Output 0 ml  Net 222 ml   Net IO Since Admission: 222 mL [02/19/21 0711]  Pertinent Labs: CBC Latest Ref Rng & Units 02/18/2021 02/18/2021 02/18/2021  WBC 4.0 - 10.5 K/uL 9.0 - 7.6  Hemoglobin 13.0 - 17.0 g/dL 15.7 16.7 16.4  Hematocrit 39.0 - 52.0 % 46.4 49.0 48.7  Platelets 150 - 400 K/uL 274 - 288    CMP Latest Ref Rng & Units 02/18/2021 02/18/2021 02/18/2021  Glucose 70 - 99 mg/dL - - 160(H)  BUN 8 - 23 mg/dL - - 14  Creatinine 0.61 - 1.24 mg/dL 0.96 - 0.99  Sodium 135 - 145 mmol/L - 140 140  Potassium 3.5 - 5.1 mmol/L - 4.1 3.8  Chloride 98 - 111 mmol/L - - 107   CO2 22 - 32 mmol/L - - 18(L)  Calcium 8.9 - 10.3 mg/dL - - 9.5  Total Protein 6.5 - 8.1 g/dL - - -  Total Bilirubin 0.3 - 1.2 mg/dL - - -  Alkaline Phos 38 - 126 U/L - - -  AST 15 - 41 U/L - - -  ALT 0 - 44 U/L - - -    Imaging: DG Chest Port 1 View  Result Date: 02/18/2021 CLINICAL DATA:  Shortness of breath. EXAM: PORTABLE CHEST 1 VIEW COMPARISON:  Prior chest radiographs 12/21/2020 and earlier. FINDINGS: Heart size within normal limits. No appreciable airspace consolidation or pulmonary edema. No evidence of pleural effusion or pneumothorax. No acute bony abnormality identified. IMPRESSION: No evidence of active cardiopulmonary disease. Electronically Signed   By: Kellie Simmering D.O.   On: 02/18/2021 14:32    Assessment/Plan:   Principal Problem:   COPD with acute exacerbation [facility])   Patient Summary: Jason Wong is a 65 y.o.  PMH of HTN, Asthma, HCV s/p treatment, and emphysema presenting with shortness of breath admitted for a COPD exacerbation.   #COPD exacerbation #Hx of asthma  WBC is WNL, patient is afebrile, and has no signs of infection  such as cough or increased sputum production.  Suspicion for infection remains low at this time. Breathing has improved since presentation, has been weaned down to 3L though he is still significantly short of breath on exertion and there is diffuse wheezing on exam. Will increase breathing treatment frequency and add PRN albuterol - s/p 125 solumedrol - 40 mg prednisone for 5 days - breo ellipta, duonebs Q4, albuterol Q4 PRN - mucinex BID - wean oxygen as able - will need PFTs as an outpatient   #Elevated Troponins 2/2 demand ischemia Initial troponin normal at 8, second elevated at 218, this uptrended and peaked in the 300s and is now downtrending. No ischemic changes seen on EKG. - aspirin 325, continue atorvastatin - telemetry - nitroglycerin PRN for chest pain   #HTN Patient prescribed amlodipine in the past though is not taking  this right now after running out of his medications. Added losartan on yesterday, will continue to monitor Bps today. -losartan 25, amlodipine 10   Diet: Normal VTE: Enoxaparin IVF: None,None Code: Full  Scarlett Presto, MD Internal Medicine Resident PGY-1 Pager [phone] Please contact the on call pager after 5 pm and on weekends at [phone].

## 2021-02-19 NOTE — Plan of Care (Signed)

## 2021-02-19 NOTE — Evaluation (Signed)
Physical Therapy Evaluation Patient Details Name: Lenford Beddow MRN: [id number] DOB: 1955/05/13 Today's Date: 02/19/2021      Clinical Impression  PTA pt reports living with his brother and sister in 2 story apartment with 2 steps to enter, and bed and bath on second story. Pt reports increasing work of breathing the last couple of weeks, but still able to walk to the store. Pt does report decreased energy to eat lately that has resulted in increased weight loss. Pt is currently independent with bed mobility and light min A for safety with transfers and ambulation. Pt will not need further PT services or equipment at discharge, however PT will continue to follow acutely to progress his mobility.             PT Visit Information  Last PT Received On 02/19/21  Assistance Needed +1  History of Present Illness 65 y.o. male who presented with progressive SOB. Pt reports running out of medications and inhalers. Pt admitted for acute COPD exacerbation. PMH:  HTN, Asthma, COPD, HCV s/p treatment, and emphysema  Precautions  Precautions Fall;Other (comment)  Precaution Comments monitor vitals  Restrictions  Weight Bearing Restrictions No  Home Living  Family/patient expects to be discharged to: Private residence  Living Arrangements Other relatives  Available Help at Discharge Family;Available PRN/intermittently  Type of Home House  Home Access Stairs to enter  Entrance Stairs-Number of Steps 2  Entrance Stairs-Rails None  Home Layout Two level;Bed/bath upstairs  Alternate Level Stairs-Number of Steps 12  Alternate Level Stairs-Rails Left  Bathroom Adult nurse Yes  Home Equipment None  Prior Function  Prior Level of Function  Independent/Modified Independent  Mobility Comments typically no use of AD  ADLs Comments Independent with ADLs, IADLs, assists siblings as need, does yard work and assists with handyman work as needed   Engineer, petroleum No difficulties  Pain Assessment  Pain Assessment No/denies pain  Cognition  Arousal/Alertness Awake/alert  Behavior During Therapy WFL for tasks assessed/performed  Overall Cognitive Status Within Functional Limits for tasks assessed  General Comments does not seem to fully understand what is occuring medically. knows he has asthma, but does not seem to grasp COPD piece.  Upper Extremity Assessment  Upper Extremity Assessment Overall WFL for tasks assessed  Lower Extremity Assessment  Lower Extremity Assessment Generalized weakness  Cervical / Trunk Assessment  Cervical / Trunk Assessment Normal  Bed Mobility  Overal bed mobility Modified Independent  General bed mobility comments able to move in the bed independently, increased time to account for breathing difficulties  Transfers  Overall transfer level Needs assistance  Equipment used None  Transfers Sit to/from Stand  Sit to Stand Min assist  General transfer comment min contact guard A for safety  Ambulation/Gait  Ambulation/Gait assistance Min assist  Gait Distance (Feet) 20 Feet (standing rest break at 10 feet for SpO2 recovery)  Assistive device None  Gait Pattern/deviations Step-through pattern;Antalgic;Decreased step length - right;Decreased stride length  General Gait Details antalgic gait on R LE., endorses long standing R LE pain  Gait velocity decreased  Gait velocity interpretation 1.31 - 2.62 ft/sec, indicative of limited community ambulator  Balance  Overall balance assessment Mild deficits observed, not formally tested  General Comments  General comments (skin integrity, edema, etc.) With 10 feet ambulation on RA SpO2 dropped to 88%O2 quickly rebounded to 91%O2 with cues for purse lip breathing  PT - End of Session  Equipment Utilized During  Treatment Gait belt  Activity Tolerance Patient tolerated treatment well  Patient left in chair;with call bell/phone within reach;with  chair alarm set  Nurse Communication Mobility status;Other (comment) (O2 demand with ambulation)  PT Assessment  PT Recommendation/Assessment Patient needs continued PT services  PT Visit Diagnosis Muscle weakness (generalized) (M62.81);Difficulty in walking, not elsewhere classified (R26.2)  PT Problem List Decreased strength;Decreased activity tolerance;Decreased mobility;Cardiopulmonary status limiting activity  PT Plan  PT Frequency (ACUTE ONLY) Min 3X/week  PT Treatment/Interventions (ACUTE ONLY) Gait training;Stair training;Functional mobility training;Therapeutic activities;Therapeutic exercise;Balance training;Patient/family education  AM-PAC PT "6 Clicks" Mobility Outcome Measure (Version 2)  Help needed turning from your back to your side while in a flat bed without using bedrails? 4  Help needed moving from lying on your back to sitting on the side of a flat bed without using bedrails? 4  Help needed moving to and from a bed to a chair (including a wheelchair)? 4  Help needed standing up from a chair using your arms (e.g., wheelchair or bedside chair)? 3  Help needed to walk in hospital room? 3  Help needed climbing 3-5 steps with a railing?  3  6 Click Score 21  Consider Recommendation of Discharge To: Home with no services  Progressive Mobility  What is the highest level of mobility based on the progressive mobility assessment? Level 5 (Walks with assist in room/hall) - Balance while stepping forward/back and can walk in room with assist - Complete  PT Recommendation  Follow Up Recommendations No PT follow up  Assistance recommended at discharge Set up Supervision/Assistance  Functional Status Assessment Patient has had a recent decline in their functional status and demonstrates the ability to make significant improvements in function in a reasonable and predictable amount of time.  PT equipment None recommended by PT  Individuals Consulted  Consulted and Agree with Results  and Recommendations Patient  Acute Rehab PT Goals  Patient Stated Goal catch his breath  PT Goal Formulation With patient  Time For Goal Achievement 03/05/21  Potential to Achieve Goals Good  PT Time Calculation  PT Start Time (ACUTE ONLY) 1135  PT Stop Time (ACUTE ONLY) 1203  PT Time Calculation (min) (ACUTE ONLY) 28 min  PT General Charges  $$ ACUTE PT VISIT 1 Visit  PT Evaluation  $PT Eval Moderate Complexity 1 Mod  PT Treatments  $Therapeutic Activity 8-22 mins  Written Expression  Dominant Hand Right   Rayne Cowdrey B. Migdalia Dk PT, DPT Acute Rehabilitation Services Pager [phone] Office [phone]

## 2021-02-20 LAB — CBC
HCT: 44 % (ref 39.0–52.0)
Hemoglobin: 15 g/dL (ref 13.0–17.0)
MCH: 32.7 pg (ref 26.0–34.0)
MCHC: 34.1 g/dL (ref 30.0–36.0)
MCV: 95.9 fL (ref 80.0–100.0)
Platelets: 273 10*3/uL (ref 150–400)
RBC: 4.59 MIL/uL (ref 4.22–5.81)
RDW: 14.2 % (ref 11.5–15.5)
WBC: 7.3 10*3/uL (ref 4.0–10.5)
nRBC: 0 % (ref 0.0–0.2)

## 2021-02-20 LAB — BASIC METABOLIC PANEL
Anion gap: 9 (ref 5–15)
BUN: 18 mg/dL (ref 8–23)
CO2: 23 mmol/L (ref 22–32)
Calcium: 9.7 mg/dL (ref 8.9–10.3)
Chloride: 108 mmol/L (ref 98–111)
Creatinine, Ser: 1.08 mg/dL (ref 0.61–1.24)
GFR, Estimated: >60 mL/min (ref >60)
Glucose, Bld: 106 mg/dL — ABNORMAL HIGH (ref 70–99)
Potassium: 3.9 mmol/L (ref 3.5–5.1)
Sodium: 140 mmol/L (ref 135–145)

## 2021-02-20 MED ORDER — IPRATROPIUM-ALBUTEROL 0.5-2.5 (3) MG/3ML IN SOLN
3.0000 mL | RESPIRATORY_TRACT | Status: DC
  Administered 2021-02-20 – 2021-02-21 (×4): 3 mL via RESPIRATORY_TRACT
  Filled 2021-02-20 (×5): qty 3

## 2021-02-20 MED ORDER — GUAIFENESIN-DM 100-10 MG/5ML PO SYRP
5.0000 mL | ORAL_SOLUTION | ORAL | Status: DC | PRN
  Administered 2021-02-20 – 2021-02-22 (×4): 5 mL via ORAL
  Filled 2021-02-20 (×4): qty 5

## 2021-02-20 MED ORDER — ENSURE ENLIVE PO LIQD
237.0000 mL | Freq: Three times a day (TID) | ORAL | Status: DC
  Administered 2021-02-20 – 2021-02-22 (×5): 237 mL via ORAL

## 2021-02-20 MED ORDER — ACETAMINOPHEN 325 MG PO TABS
650.0000 mg | ORAL_TABLET | Freq: Four times a day (QID) | ORAL | Status: DC | PRN
  Administered 2021-02-20: 650 mg via ORAL
  Filled 2021-02-20 (×2): qty 2

## 2021-02-20 NOTE — Progress Notes (Signed)
Pt with c/o H/A. MD on call paged via [place].

## 2021-02-20 NOTE — Progress Notes (Signed)
HD#1 Subjective:  Overnight Events: NAEO  Reports he continues to feel short of breath with exertion.  States he is having some more cough this morning.  Feels that his wheezing is about similar to yesterday.  He does not feel ready to go home.  Discussed that it may take a little while for him to start feeling better which he is understanding of.  Objective:  Vital signs in last 24 hours: Vitals:   02/19/21 2300 02/19/21 2353 02/20/21 0300 02/20/21 0423  BP:  104/77  119/84  Pulse:  71  70  Resp:  20  18  Temp:  98.8 F (37.1 C)  98.2 F (36.8 C)  TempSrc:  Oral  Oral  SpO2: 100% 98% 98% 97%  Weight:    51.6 kg  Height:       Supplemental O2: Room Air SpO2: 97 % O2 Flow Rate (L/min): 2 L/min   Physical Exam:  Constitutional: Cachectic appearing elderly male sitting up in bed Cardiovascular: regular rate and rhythm, no m/r/g Pulmonary/Chest: normal work of breathing 2L [place], currently getting breathing treatment.  Wheezing diffusely but mildly improved from yesterday  Patient with history abd: flat, soft, nontender Neurological: alert & oriented x 3, answering questions appropriately Skin: warm and dry Psych: normal affect  Filed Weights   02/18/21 2227 02/19/21 0306 02/20/21 0423  Weight: 52.8 kg 52.8 kg 51.6 kg     Intake/Output Summary (Last 24 hours) at 02/20/2021 0641 Last data filed at 02/20/2021 0505 Gross per 24 hour  Intake 880 ml  Output 900 ml  Net -20 ml    Net IO Since Admission: 202 mL [02/20/21 0641]  Pertinent Labs: CBC Latest Ref Rng & Units 02/20/2021 02/19/2021 02/18/2021  WBC 4.0 - 10.5 K/uL 7.3 6.4 9.0  Hemoglobin 13.0 - 17.0 g/dL 15.0 15.4 15.7  Hematocrit 39.0 - 52.0 % 44.0 46.1 46.4  Platelets 150 - 400 K/uL 273 267 274    CMP Latest Ref Rng & Units 02/20/2021 02/19/2021 02/18/2021  Glucose 70 - 99 mg/dL 106(H) 113(H) -  BUN 8 - 23 mg/dL 18 17 -  Creatinine 0.61 - 1.24 mg/dL 1.08 0.98 0.96  Sodium 135 - 145 mmol/L 140 137 -   Potassium 3.5 - 5.1 mmol/L 3.9 4.3 -  Chloride 98 - 111 mmol/L 108 105 -  CO2 22 - 32 mmol/L 23 24 -  Calcium 8.9 - 10.3 mg/dL 9.7 9.9 -  Total Protein 6.5 - 8.1 g/dL - - -  Total Bilirubin 0.3 - 1.2 mg/dL - - -  Alkaline Phos 38 - 126 U/L - - -  AST 15 - 41 U/L - - -  ALT 0 - 44 U/L - - -    Imaging: No results found.  Assessment/Plan:   Principal Problem:   COPD with acute exacerbation (HCC) Active Problems:   Asthma   Essential hypertension   Tobacco dependence   COPD (chronic obstructive pulmonary disease) with emphysema ([place])   Patient Summary: Jason Wong is a 65 y.o.  PMH of HTN, Asthma, HCV s/p treatment, and emphysema presenting with shortness of breath admitted for a COPD exacerbation.   #COPD exacerbation #Hx of asthma Patient with history of episodic mid to this changes on imaging but no prior PFTs.  Continues to have dyspnea on exertion as well as wheezing.  Oxygen stable on 2 L at rest.  Continues to have wheezing diffusely although this is somewhat improved from yesterday.  We will continue on DuoNebs  every 4 and albuterol as well as prednisone for COPD exacerbation. - Continue prednisone for 5 days - breo ellipta daily, duonebs Q4, albuterol Q4 PRN - mucinex BID - wean oxygen as able - will need PFTs as an outpatient   #Elevated Troponins 2/2 demand ischemia Initial troponin normal at 8, second elevated at 218, this uptrended and peaked in the 300s for downtrending to the 200s.  Patient without chest pain. -Continue atorvastatin - telemetry - nitroglycerin PRN for chest pain   #HTN Normotensive at 119/84.  Continue with present management -losartan 25, amlodipine 10   Diet: Normal VTE: Enoxaparin IVF: None,None Code: Full  Iona Beard  Internal Medicine Resident PGY- Pager [phone] Please contact the on call pager after 5 pm and on weekends at [phone].

## 2021-02-20 NOTE — TOC Progression Note (Signed)
Transition of Care [facility]) - Progression Note    Patient Details  Name: Jason Wong MRN: [id number] Date of Birth: 05-28-1955  Transition of [facility]) CM/SW Contact  Zenon Mayo, RN Phone Number: 02/20/2021, 2:16 PM  Clinical Narrative:    NCM gave patient a [employer] discount card to assist with getting his inhaler.  He states his insurance does not cover it.  He also states he lives with his sister and he will have transportation home at discharge.         Expected Discharge Plan and Services                                                 Social Determinants of Health (SDOH) Interventions    Readmission Risk Interventions No flowsheet data found.

## 2021-02-20 NOTE — Progress Notes (Signed)
Initial Nutrition Assessment  DOCUMENTATION CODES:   Underweight  INTERVENTION:  Provide Ensure Enlive po TID, each supplement provides 350 kcal and 20 grams of protein  Encourage adequate PO intake.   NUTRITION DIAGNOSIS:   Increased nutrient needs related to chronic illness (COPD) as evidenced by estimated needs.  GOAL:   Patient will meet greater than or equal to 90% of their needs  MONITOR:   PO intake, Supplement acceptance, Skin, Weight trends, Labs, I & O's  REASON FOR ASSESSMENT:   Malnutrition Screening Tool    ASSESSMENT:   65 y.o.  PMH of HTN, Asthma, HCV s/p treatment, and emphysema presenting with shortness of breath admitted for a COPD exacerbation.  Pt unavailable during attempted time of contact. Meal completion has been 90-100%. Per weight records, pt with a 11% weight loss in 10 months. RD to order nutritional supplements to aid in caloric and protein needs. Unable to complete Nutrition-Focused physical exam at this time.   Labs and medications reviewed.   Diet Order:   Diet Order             Diet Heart Room service appropriate? Yes; Fluid consistency: Thin  Diet effective now                   EDUCATION NEEDS:   Not appropriate for education at this time  Skin:  Skin Assessment: Skin Integrity Issues: Skin Integrity Issues:: Other (Comment) Other: non pressure L ankle wound  Last BM:  12/3  Height:   Ht Readings from Last 1 Encounters:  02/18/21 5\' 9"  (1.753 m)    Weight:   Wt Readings from Last 1 Encounters:  02/20/21 51.6 kg    Ideal Body Weight:  72.7 kg  BMI:  Body mass index is 16.8 kg/m.  Estimated Nutritional Needs:   Kcal:  1800-2000  Protein:  90-100 grams  Fluid:  >/= 1.8 L/day  Corrin Parker, MS, RD, LDN RD pager number/after hours weekend pager number on Amion.

## 2021-02-20 NOTE — Plan of Care (Signed)

## 2021-02-21 LAB — BASIC METABOLIC PANEL
Anion gap: 9 (ref 5–15)
BUN: 24 mg/dL — ABNORMAL HIGH (ref 8–23)
CO2: 26 mmol/L (ref 22–32)
Calcium: 9.3 mg/dL (ref 8.9–10.3)
Chloride: 105 mmol/L (ref 98–111)
Creatinine, Ser: 1.02 mg/dL (ref 0.61–1.24)
GFR, Estimated: >60 mL/min (ref >60)
Glucose, Bld: 100 mg/dL — ABNORMAL HIGH (ref 70–99)
Potassium: 4.1 mmol/L (ref 3.5–5.1)
Sodium: 140 mmol/L (ref 135–145)

## 2021-02-21 LAB — CBC
HCT: 43 % (ref 39.0–52.0)
Hemoglobin: 14.5 g/dL (ref 13.0–17.0)
MCH: 33 pg (ref 26.0–34.0)
MCHC: 33.7 g/dL (ref 30.0–36.0)
MCV: 97.9 fL (ref 80.0–100.0)
Platelets: 244 10*3/uL (ref 150–400)
RBC: 4.39 MIL/uL (ref 4.22–5.81)
RDW: 14 % (ref 11.5–15.5)
WBC: 7.3 10*3/uL (ref 4.0–10.5)
nRBC: 0 % (ref 0.0–0.2)

## 2021-02-21 NOTE — Progress Notes (Signed)
HD#2 Subjective:  Overnight Events: NAEO Patient laying in bed this morning states that his wheezing is very slightly better.  He continues to note significant shortness of breath with exertion such as when he is sitting up to the side of the bed or with walking to the bathroom.  States he is concerned that he will return hospital due to the amount of exertional dyspnea he has.  Until he is feeling more confident returning home.  Objective:  Vital signs in last 24 hours: Vitals:   02/20/21 1119 02/20/21 1513 02/20/21 2036 02/21/21 0625  BP: 116/88  116/74 (!) 118/92  Pulse: 82  84 (!) 104  Resp: 20  19 18   Temp: 99 F (37.2 C)  97.8 F (36.6 C) 97.6 F (36.4 C)  TempSrc: Oral  Oral Oral  SpO2: 98% 97% 97% 97%  Weight:    54.2 kg  Height:       Supplemental O2: Room Air SpO2: 97 % O2 Flow Rate (L/min): 2 L/min   Physical Exam:  Constitutional: Cachectic appearing elderly male sitting up in bed Cardiovascular: regular rate and rhythm, no m/r/g Pulmonary/Chest: normal work of breathing 2L Jason Wong.  Mild expiratory wheeze on left mid lung fields, no wheezing on the right. Patient with history abd: flat, soft, nontender Neurological: alert & oriented x 3, answering questions appropriately Skin: warm and dry Psych: normal affect  Filed Weights   02/19/21 0306 02/20/21 0423 02/21/21 0625  Weight: 52.8 kg 51.6 kg 54.2 kg     Intake/Output Summary (Last 24 hours) at 02/21/2021 2778 Last data filed at 02/21/2021 2423 Gross per 24 hour  Intake 540 ml  Output 700 ml  Net -160 ml    Net IO Since Admission: 42 mL [02/21/21 0628]  Pertinent Labs: CBC Latest Ref Rng & Units 02/20/2021 02/19/2021 02/18/2021  WBC 4.0 - 10.5 K/uL 7.3 6.4 9.0  Hemoglobin 13.0 - 17.0 g/dL 15.0 15.4 15.7  Hematocrit 39.0 - 52.0 % 44.0 46.1 46.4  Platelets 150 - 400 K/uL 273 267 274    CMP Latest Ref Rng & Units 02/20/2021 02/19/2021 02/18/2021  Glucose 70 - 99 mg/dL 106(H) 113(H) -  BUN 8 - 23 mg/dL  18 17 -  Creatinine 0.61 - 1.24 mg/dL 1.08 0.98 0.96  Sodium 135 - 145 mmol/L 140 137 -  Potassium 3.5 - 5.1 mmol/L 3.9 4.3 -  Chloride 98 - 111 mmol/L 108 105 -  CO2 22 - 32 mmol/L 23 24 -  Calcium 8.9 - 10.3 mg/dL 9.7 9.9 -  Total Protein 6.5 - 8.1 g/dL - - -  Total Bilirubin 0.3 - 1.2 mg/dL - - -  Alkaline Phos 38 - 126 U/L - - -  AST 15 - 41 U/L - - -  ALT 0 - 44 U/L - - -    Imaging: No results found.  Assessment/Plan:   Principal Problem:   COPD with acute exacerbation (HCC) Active Problems:   Asthma   Essential hypertension   Tobacco dependence   COPD (chronic obstructive pulmonary disease) with emphysema ([place])   Patient Summary: Jason Wong is a 65 y.o.  PMH of HTN, Asthma, HCV s/p treatment, and emphysema presenting with shortness of breath admitted for a COPD exacerbation.   #COPD exacerbation #Hx of asthma Patient continues to feel dyspneic on exertion but feels his wheezing is slowly improving.  On exam wheezing has significantly improved from yesterday.  He is currently on 2 L nasal cannula in the room.  Will obtain ambulatory oxygen today.  Hope that the patient will start feeling better - Continue prednisone for 5 days last day 12/6 - breo ellipta daily, duonebs Q4, albuterol Q4 PRN - mucinex BID - wean oxygen as able - follow up ambulatory o2 saturations - No pt/OT follow up - will need PFTs as an outpatient   Diet: Normal VTE: Enoxaparin IVF: None,None Code: Full  Jason Wong  Internal Medicine Resident PGY-2 Pager [phone] Please contact the on call pager after 5 pm and on weekends at [phone].

## 2021-02-21 NOTE — Plan of Care (Signed)

## 2021-02-21 NOTE — Progress Notes (Signed)
SATURATION QUALIFICATIONS: (This note is used to comply with regulatory documentation for home oxygen)  Patient Saturations on Room Air at Rest = 97%  Patient Saturations on Room Air while Ambulating = 98%  Patient Saturations on 2 Liters of oxygen while Ambulating = 100%  Please briefly explain why patient needs home oxygen: Not necessary.

## 2021-02-22 ENCOUNTER — Other Ambulatory Visit (HOSPITAL_COMMUNITY): Payer: Self-pay

## 2021-02-22 DIAGNOSIS — J441 Chronic obstructive pulmonary disease with (acute) exacerbation: Secondary | ICD-10-CM | POA: Diagnosis not present

## 2021-02-22 LAB — CBC
HCT: 43.1 % (ref 39.0–52.0)
Hemoglobin: 14.6 g/dL (ref 13.0–17.0)
MCH: 33 pg (ref 26.0–34.0)
MCHC: 33.9 g/dL (ref 30.0–36.0)
MCV: 97.3 fL (ref 80.0–100.0)
Platelets: 267 10*3/uL (ref 150–400)
RBC: 4.43 MIL/uL (ref 4.22–5.81)
RDW: 13.5 % (ref 11.5–15.5)
WBC: 7.7 10*3/uL (ref 4.0–10.5)
nRBC: 0 % (ref 0.0–0.2)

## 2021-02-22 LAB — BASIC METABOLIC PANEL
Anion gap: 7 (ref 5–15)
BUN: 27 mg/dL — ABNORMAL HIGH (ref 8–23)
CO2: 26 mmol/L (ref 22–32)
Calcium: 9.7 mg/dL (ref 8.9–10.3)
Chloride: 104 mmol/L (ref 98–111)
Creatinine, Ser: 1.03 mg/dL (ref 0.61–1.24)
GFR, Estimated: >60 mL/min (ref >60)
Glucose, Bld: 101 mg/dL — ABNORMAL HIGH (ref 70–99)
Potassium: 4 mmol/L (ref 3.5–5.1)
Sodium: 137 mmol/L (ref 135–145)

## 2021-02-22 MED ORDER — AMLODIPINE BESYLATE 5 MG PO TABS
5.0000 mg | ORAL_TABLET | Freq: Every day | ORAL | 0 refills | Status: DC
Start: 2021-02-23 — End: 2021-03-23
  Filled 2021-02-22: qty 30, 30d supply, fill #0

## 2021-02-22 MED ORDER — FLUTICASONE FUROATE-VILANTEROL 200-25 MCG/ACT IN AEPB
1.0000 | INHALATION_SPRAY | Freq: Every day | RESPIRATORY_TRACT | 0 refills | Status: DC
  Filled 2021-02-22: qty 60, 30d supply, fill #0

## 2021-02-22 MED ORDER — DM-GUAIFENESIN ER 30-600 MG PO TB12
1.0000 | ORAL_TABLET | Freq: Two times a day (BID) | ORAL | 0 refills | Status: DC
  Filled 2021-02-22: qty 60, 30d supply, fill #0

## 2021-02-22 MED ORDER — ATORVASTATIN CALCIUM 10 MG PO TABS
10.0000 mg | ORAL_TABLET | Freq: Every day | ORAL | 0 refills | Status: DC
  Filled 2021-02-22: qty 30, 30d supply, fill #0

## 2021-02-22 MED ORDER — ALBUTEROL SULFATE (2.5 MG/3ML) 0.083% IN NEBU
2.5000 mg | INHALATION_SOLUTION | RESPIRATORY_TRACT | 12 refills | Status: DC | PRN
  Filled 2021-02-22: qty 90, 5d supply, fill #0

## 2021-02-22 NOTE — Progress Notes (Signed)
Mobility Specialist Progress Note:   02/22/21 1033  Mobility  Activity Ambulated in hall  Level of Assistance Modified independent, requires aide device or extra time  Assistive Device None  Distance Ambulated (ft) 520 ft  Mobility Ambulated with assistance in hallway  Mobility Response Tolerated well  Mobility performed by Mobility specialist  SATURATION QUALIFICATIONS: (This note is used to comply with regulatory documentation for home oxygen)  Patient Saturations on Room Air at Rest = 96%  Patient Saturations on Room Air while Ambulating = 94%  Patient Saturations on 0 Liters of oxygen while Ambulating = n/a%  Pt received in bed willing to participate in mobility. No complaints of pain. Pt said he felt short of breath but SpO2 remained above 94% throughout ambulation. Returned to EOB with call bell in reach and all needs met.   [facility] Public librarian Phone [phone] Secondary Phone [phone]

## 2021-02-22 NOTE — Progress Notes (Incomplete)
HD#3 Subjective:  Overnight Events: ***   ***  Objective:  Vital signs in last 24 hours: Vitals:   02/21/21 1408 02/21/21 1517 02/21/21 1923 02/22/21 0500  BP:   104/74 95/71  Pulse:   77 66  Resp:   19 18  Temp:   98 F (36.7 C) 98.1 F (36.7 C)  TempSrc:   Oral Oral  SpO2: 97% 96% 97% 97%  Weight:    55.1 kg  Height:       Supplemental O2: Room Air SpO2: 97 % O2 Flow Rate (L/min): 2 L/min   Physical Exam:  Constitutional: Cachectic appearing elderly male sitting up in bed Cardiovascular: regular rate and rhythm, no m/r/g *** Pulmonary/Chest: normal work of breathing 2L [place].  Mild expiratory wheeze on left mid lung fields, no wheezing on the right. Patient with history abd: flat, soft, nontender Neurological: alert & oriented x 3, answering questions appropriately Skin: warm and dry Psych: normal affect  Filed Weights   02/20/21 0423 02/21/21 0625 02/22/21 0500  Weight: 51.6 kg 54.2 kg 55.1 kg     Intake/Output Summary (Last 24 hours) at 02/22/2021 3300 Last data filed at 02/22/2021 0502 Gross per 24 hour  Intake 480 ml  Output 600 ml  Net -120 ml   Net IO Since Admission: -78 mL [02/22/21 0632]  Pertinent Labs: CBC Latest Ref Rng & Units 02/22/2021 02/21/2021 02/20/2021  WBC 4.0 - 10.5 K/uL 7.7 7.3 7.3  Hemoglobin 13.0 - 17.0 g/dL 14.6 14.5 15.0  Hematocrit 39.0 - 52.0 % 43.1 43.0 44.0  Platelets 150 - 400 K/uL 267 244 273    CMP Latest Ref Rng & Units 02/22/2021 02/21/2021 02/20/2021  Glucose 70 - 99 mg/dL 101(H) 100(H) 106(H)  BUN 8 - 23 mg/dL 27(H) 24(H) 18  Creatinine 0.61 - 1.24 mg/dL 1.03 1.02 1.08  Sodium 135 - 145 mmol/L 137 140 140  Potassium 3.5 - 5.1 mmol/L 4.0 4.1 3.9  Chloride 98 - 111 mmol/L 104 105 108  CO2 22 - 32 mmol/L 26 26 23   Calcium 8.9 - 10.3 mg/dL 9.7 9.3 9.7  Total Protein 6.5 - 8.1 g/dL - - -  Total Bilirubin 0.3 - 1.2 mg/dL - - -  Alkaline Phos 38 - 126 U/L - - -  AST 15 - 41 U/L - - -  ALT 0 - 44 U/L - - -     Imaging: No results found.  Assessment/Plan:   Principal Problem:   COPD with acute exacerbation (HCC) Active Problems:   Asthma   Essential hypertension   Tobacco dependence   COPD (chronic obstructive pulmonary disease) with emphysema ([place])   Patient Summary: Jason Wong is a 65 y.o. with a pertinent PMH of HTN, Asthma, HCV s/p treatment, and emphysema presenting with shortness of breath admitted for a COPD exacerbation.   #COPD exacerbation #Hx of asthma Patient continues to feel dyspneic on exertion but feels his wheezing is slowly improving.  On exam wheezing has significantly improved from yesterday.  He is currently on 2 L nasal cannula in the room.  Will obtain ambulatory oxygen today.  Hope that the patient will start feeling better - Continue prednisone for 5 days last day 12/6 - breo ellipta daily, duonebs Q4, albuterol Q4 PRN - mucinex BID - wean oxygen as able - follow up ambulatory o2 saturations - No pt/OT follow up - will need PFTs as an outpatient  #HTN Patient prescribed amlodipine in the past though is not taking this  right now after running out of his medications. Added losartan on yesterday, will continue to monitor Bps today. -losartan 25, amlodipine 10  #Elevated Troponins 2/2 demand ischemia; resolved   Diet: Normal VTE: Enoxaparin IVF: None,None Code: Full  Scarlett Presto, MD Internal Medicine Resident PGY-1 Pager [phone] Please contact the on call pager after 5 pm and on weekends at [phone].

## 2021-02-22 NOTE — Care Management Important Message (Signed)
Important Message  Patient Details  Name: Jason Wong MRN: [id number] Date of Birth: 24-Nov-1955   Medicare Important Message Given:  Yes     [name], [name] 02/22/2021, 11:51 AM

## 2021-02-22 NOTE — Discharge Instructions (Signed)
Jason Wong  You were recently admitted to [facility] for trouble breathing and shortness of breath.   Continue taking your home medications with the following changes  Start using breo ellipta inhaler once daily Start using albuterol inhaler every four hours as needed for wheezing and shortness of breath Continue taking mucinex twice daily for the next week as needed for cough/congestion   You should seek further medical care if you develop increasing shortness of breath, fevers, increasing cough or mucous production, or lightheadedness.  We recommend that you see your primary care doctor in about a week to make sure that you continue to improve. We are so glad that you are feeling better.  Sincerely, Scarlett Presto, MD

## 2021-02-22 NOTE — TOC Transition Note (Signed)
Transition of Care [facility]) - CM/SW Discharge Note   Patient Details  Name: Jason Wong MRN: [id number] Date of Birth: [birth year]  [facility] CM/SW Contact:  Zenon Mayo, RN Phone Number: 02/22/2021, 12:22 PM   Clinical Narrative:    Patient is for dc today, NCM gave him a discount card the other day for the inhaler medications.  He will have transport at dc.   Final next level of care: Home/Self Care Barriers to Discharge: No Barriers Identified   Patient Goals and CMS Choice Patient states their goals for this hospitalization and ongoing recovery are:: return home   Choice offered to / list presented to : NA  Discharge Placement                       Discharge Plan and Services                  DME Agency: NA       HH Arranged: NA          Social Determinants of Health (SDOH) Interventions     Readmission Risk Interventions No flowsheet data found.

## 2021-02-22 NOTE — Discharge Summary (Signed)
Name: Jason Wong MRN: [id number] DOB: 11-Jun-1955 65 y.o. PCP: Ladell Pier, MD  Date of Admission: 02/18/2021  1:30 PM Date of Discharge:   02/22/21 Attending Physician: Axel Filler, *  Discharge Diagnosis: 1. COPD exacerbation; Hx of asthma 2. HTN 3. Elevated Troponins 2/2 demand ischemia; resolved  Discharge Medications: Allergies as of 02/22/2021   No Known Allergies      Medication List     STOP taking these medications    albuterol 108 (90 Base) MCG/ACT inhaler Commonly known as: VENTOLIN HFA Replaced by: albuterol (2.5 MG/3ML) 0.083% nebulizer solution       TAKE these medications    albuterol (2.5 MG/3ML) 0.083% nebulizer solution Commonly known as: PROVENTIL Take 3 mLs (2.5 mg total) by nebulization every 4 (four) hours as needed for wheezing or shortness of breath. Replaces: albuterol 108 (90 Base) MCG/ACT inhaler   amLODipine 5 MG tablet Commonly known as: NORVASC Take 1 tablet (5 mg total) by mouth daily. Start taking on: February 23, 2021 What changed: how much to take   atorvastatin 10 MG tablet Commonly known as: LIPITOR Take 1 tablet (10 mg total) by mouth daily. Start taking on: February 23, 2021 What changed: how much to take   benzonatate 100 MG capsule Commonly known as: TESSALON Take 1 capsule (100 mg total) by mouth 2 (two) times daily as needed for cough.   dextromethorphan-guaiFENesin 30-600 MG 12hr tablet Commonly known as: MUCINEX DM Take 1 tablet by mouth 2 (two) times daily.   fluticasone furoate-vilanterol 200-25 MCG/ACT Aepb Commonly known as: BREO ELLIPTA Inhale 1 puff into the lungs daily. What changed:  medication strength when to take this   multivitamin with minerals Tabs tablet Take 1 tablet by mouth daily.   naloxone 2 MG/2ML injection Commonly known as: NARCAN Inject if unconscious or not breathing        Disposition and follow-up:   Mr.Jason Wong was discharged from [facility] in Stable condition.  At the hospital follow up visit please address:  1.  COPD exacerbation; Hx of asthma- complicated by patient's inability to afford medications. Patient would benefit from getting in touch with social work to help with medication assistance. Recheck patient's oxygen saturation. Will also need PFTs  2. HTN - titrate medications as you see fit  2.  Labs / imaging needed at time of follow-up: bmp  3.  Pending labs/ test needing follow-up: none  Follow-up Appointments:   Hospital Course by problem list: 1. COPD exacerbation; Hx of asthma Patient came in with a COPD exacerbation in the setting of medication nonadherence due to running out of medications. Patient was diffusely wheezy on exam, initially requiring Bipap in the ED. Patient did not have signs of infection, WBC WNL, no fevers, CXR did not show any active cardiopulmonary disease, and he did not have increased sputum production or cough. No antibiotics were started. He was given breathing treatments and started on a steroid course and his oxygen was able to be weaned down. By day of discharge patient was saturating well on room air, was not requiring any oxygen, and did not have nay desaturations while ambulating.  2. HTN Patient prescribed amlodipine in the past though is not taking this right now after running out of his medications. BP high to the 240s on arrival in the setting of respiratory distress. He was restarted on amlodipine 5 mg while inpatient and losartan 25 was initially added but pressures were low-normal running in  the high 90s low 100s. Recommend continued titration in the ambulatory setting.   3. Elevated Troponins 2/2 Demand ischemia Patient was not complaining of any chest pain but did come in significantly hypertensive and in respiratory distress due to COPD exacerbation as above. Troponins peaked at around 300. Continued patient's atorvastatin.    Discharge Exam:   BP 95/71  (BP Location: Left Arm)   Pulse 66   Temp 98.1 F (36.7 C) (Oral)   Resp 18   Ht 5\' 9"  (1.753 m)   Wt 55.1 kg   SpO2 97%   BMI 17.94 kg/m  Constitutional: Cachectic appearing elderly male sitting up in bed  Cardiovascular: regular rate and rhythm, no m/r/g Pulmonary/Chest: normal work of breathing, improved breathing with decreased wheezing Abd: flat, soft, nontender Neurological: alert & oriented x 3, answering questions appropriately Skin: warm and dry Psych: normal affect  Pertinent Labs, Studies, and Procedures:   DG Chest Port 1 View  Result Date: 02/18/2021 CLINICAL DATA:  Shortness of breath. EXAM: PORTABLE CHEST 1 VIEW COMPARISON:  Prior chest radiographs 12/21/2020 and earlier. FINDINGS: Heart size within normal limits. No appreciable airspace consolidation or pulmonary edema. No evidence of pleural effusion or pneumothorax. No acute bony abnormality identified. IMPRESSION: No evidence of active cardiopulmonary disease. Electronically Signed   By: Kellie Simmering D.O.   On: 02/18/2021 14:32    CBC Latest Ref Rng & Units 02/22/2021 02/21/2021 02/20/2021  WBC 4.0 - 10.5 K/uL 7.7 7.3 7.3  Hemoglobin 13.0 - 17.0 g/dL 14.6 14.5 15.0  Hematocrit 39.0 - 52.0 % 43.1 43.0 44.0  Platelets 150 - 400 K/uL 267 244 273    BMP Latest Ref Rng & Units 02/22/2021 02/21/2021 02/20/2021  Glucose 70 - 99 mg/dL 101(H) 100(H) 106(H)  BUN 8 - 23 mg/dL 27(H) 24(H) 18  Creatinine 0.61 - 1.24 mg/dL 1.03 1.02 1.08  BUN/Creat Ratio 10 - 24 - - -  Sodium 135 - 145 mmol/L 137 140 140  Potassium 3.5 - 5.1 mmol/L 4.0 4.1 3.9  Chloride 98 - 111 mmol/L 104 105 108  CO2 22 - 32 mmol/L 26 26 23   Calcium 8.9 - 10.3 mg/dL 9.7 9.3 9.7    Discharge Instructions:   Signed: Scarlett Presto, MD 02/22/2021, 6:35 AM   Pager: [phone]

## 2021-02-23 ENCOUNTER — Telehealth: Payer: Self-pay

## 2021-02-23 NOTE — Telephone Encounter (Signed)
Transition Care Management Unsuccessful Follow-up Telephone Call  Date of discharge and from where:  02/22/2021, [facility]   Attempts:  1st Attempt  Reason for unsuccessful TCM follow-up call:  Unable to reach patient - # [phone] rings busy. Multiple attempts made.  This is also the number for patient's sister, Freda Munro.  Call placed to # [phone] and the recording stated that the call cannot be completed at this time    Patient has appointment with Dr Wynetta Emery 03/23/2021. Need to appointment for patient to be seen sooner if possible

## 2021-02-24 ENCOUNTER — Telehealth: Payer: Self-pay

## 2021-02-24 NOTE — Telephone Encounter (Signed)
Transition Care Management Unsuccessful Follow-up Telephone Call  Date of discharge and from where:  02/22/2021, [facility]  Attempts:  2nd Attempt  Reason for unsuccessful TCM follow-up call:  Left voice message - spoke to patient's sister # [phone]. She said patient was not with her. Requested she have him call this CM back # [phone].

## 2021-02-25 ENCOUNTER — Telehealth: Payer: Self-pay

## 2021-02-25 NOTE — Telephone Encounter (Signed)
Transition Care Management Unsuccessful Follow-up Telephone Call  Date of discharge and from where:  02/22/2021, [facility]   Attempts:  3rd Attempt  Reason for unsuccessful TCM follow-up call:  Unable to reach patient   # [phone] just rings, no option for voicemail This is also the number for patient's sister, Freda Munro.  Call placed to # [phone] and the recording stated that the call cannot be completed at this time      Patient has appointment with Dr Wynetta Emery 03/23/2021.

## 2021-03-09 ENCOUNTER — Ambulatory Visit: Payer: Medicare Other | Admitting: Internal Medicine

## 2021-03-23 ENCOUNTER — Other Ambulatory Visit: Payer: Self-pay

## 2021-03-23 ENCOUNTER — Encounter: Payer: Self-pay | Admitting: Internal Medicine

## 2021-03-23 ENCOUNTER — Ambulatory Visit: Payer: Medicare Other | Attending: Internal Medicine | Admitting: Internal Medicine

## 2021-03-23 VITALS — BP 130/78 | HR 70 | Resp 16 | Wt 119.4 lb

## 2021-03-23 DIAGNOSIS — E7800 Pure hypercholesterolemia, unspecified: Secondary | ICD-10-CM | POA: Diagnosis not present

## 2021-03-23 DIAGNOSIS — Z23 Encounter for immunization: Secondary | ICD-10-CM

## 2021-03-23 DIAGNOSIS — Z09 Encounter for follow-up examination after completed treatment for conditions other than malignant neoplasm: Secondary | ICD-10-CM | POA: Diagnosis not present

## 2021-03-23 DIAGNOSIS — I10 Essential (primary) hypertension: Secondary | ICD-10-CM | POA: Diagnosis not present

## 2021-03-23 DIAGNOSIS — Z1211 Encounter for screening for malignant neoplasm of colon: Secondary | ICD-10-CM

## 2021-03-23 DIAGNOSIS — F17200 Nicotine dependence, unspecified, uncomplicated: Secondary | ICD-10-CM

## 2021-03-23 DIAGNOSIS — J432 Centrilobular emphysema: Secondary | ICD-10-CM | POA: Diagnosis not present

## 2021-03-23 DIAGNOSIS — Z860100 Personal history of colon polyps, unspecified: Secondary | ICD-10-CM

## 2021-03-23 MED ORDER — NICOTINE 7 MG/24HR TD PT24
7.0000 mg | MEDICATED_PATCH | Freq: Every day | TRANSDERMAL | 0 refills | Status: DC
  Filled 2021-03-23: qty 28, 28d supply, fill #0

## 2021-03-23 MED ORDER — AMLODIPINE BESYLATE 5 MG PO TABS
5.0000 mg | ORAL_TABLET | Freq: Every day | ORAL | 2 refills | Status: DC
  Filled 2021-03-23 – 2021-07-07 (×2): qty 90, 90d supply, fill #0

## 2021-03-23 MED ORDER — FLUTICASONE FUROATE-VILANTEROL 200-25 MCG/ACT IN AEPB
1.0000 | INHALATION_SPRAY | Freq: Every day | RESPIRATORY_TRACT | 6 refills | Status: DC
  Filled 2021-03-23: qty 60, 30d supply, fill #0

## 2021-03-23 MED ORDER — ATORVASTATIN CALCIUM 10 MG PO TABS
10.0000 mg | ORAL_TABLET | Freq: Every day | ORAL | 2 refills | Status: DC
  Filled 2021-03-23 – 2021-07-07 (×2): qty 90, 90d supply, fill #0

## 2021-03-23 MED ORDER — BENZONATATE 100 MG PO CAPS
100.0000 mg | ORAL_CAPSULE | Freq: Two times a day (BID) | ORAL | 0 refills | Status: DC | PRN
  Filled 2021-03-23: qty 20, 10d supply, fill #0

## 2021-03-23 MED ORDER — ALBUTEROL SULFATE HFA 108 (90 BASE) MCG/ACT IN AERS
2.0000 | INHALATION_SPRAY | Freq: Four times a day (QID) | RESPIRATORY_TRACT | 5 refills | Status: DC | PRN
  Filled 2021-03-23: qty 8.5, 25d supply, fill #0

## 2021-03-23 MED ORDER — ZOSTER VAC RECOMB ADJUVANTED 50 MCG/0.5ML IM SUSR: 0.5000 mL | Freq: Once | INTRAMUSCULAR | 0 refills | Status: AC

## 2021-03-23 MED ORDER — ZOSTER VAC RECOMB ADJUVANTED 50 MCG/0.5ML IM SUSR
0.5000 mL | Freq: Once | INTRAMUSCULAR | 0 refills | Status: DC
  Filled 2021-03-23: qty 0.5, 1d supply, fill #0

## 2021-03-23 NOTE — Progress Notes (Signed)
Patient ID: Jason Wong, male    DOB: 18-Jun-1955  MRN: [id number]  CC: Hospitalization Follow-up and Cough   Subjective: Jason Wong is a 66 y.o. male who presents for hosp f/u His concerns today include:  Pt with hx of HTN, tob dep, seasonal allergies, asthma, centrilobular emphysema, insomnia, coronary atherosclerosis, pre-DM, polysub abuse (ETOH, cocaine, drug over dose accidentally 08/2020)  and hep C treated.  Has empty bottle of Norvasc and bottle of Lipitor with 2 pills in it with him  Patient hospitalized 12/1-07/2020 with COPD exacerbation in the setting of medication nonadherence due to running out of medications.  He initially required BiPAP in the emergency room.  Did not find to have underlying infection with normal WBC, no fever, and chest x-ray without active cardiopulmonary disease.  He was given breathing treatments and placed on a course of steroids.  By discharge patient was saturating well on room air and did not require supplemental oxygen.  Blood pressure initially elevated on admission but subsequently came down.  He was found to have elevated troponins thought to be due to demand ischemia.  Atorvastatin was continued.  Today: COPD/asthma:  still a little SOB "when I walk to the store sometimes."  Still has cough that is worse at nights.  Attributes this to getting flu shot during hosp.  No fever Uses Breo but not every day.  Almost out of it.  Does not have albuterol/proventil inhaler. Trying to quit smoking.  Down to 1-2 cigarettes a day.  Given patches while in hosp which he found helpful in decreasing his cravings.  Request rxn for patches.  HTN:  took last Norvasc this a.m.  Limits salt in foods a little.  No chest pains or shortness of breath at this time.  No lower extremity edema.  HL, he is needing refill on atorvastatin.  HM: He is due for his second shot of the shingles/Shingrix vaccine.  He is also due for repeat colonoscopy.  He had colonoscopy in 2019  with removal of adenomatous polyps.  Plan was for repeat in 3 years.  Patient Active Problem List   Diagnosis Date Noted   COPD (chronic obstructive pulmonary disease) with emphysema ([place]) 02/19/2021   COPD with acute exacerbation ([place]) 02/18/2021   Memory deficit 04/28/2020   Hepatitis C virus infection cured after antiviral drug therapy 04/27/2018   Enuresis 04/27/2018   Centrilobular emphysema ([place]) 04/27/2018   Seasonal allergic rhinitis 04/27/2018   Tobacco dependence 08/24/2017   Weight loss, unintentional 08/24/2017   Insomnia 06/27/2017   Prediabetes 01/24/2017   Asthma 06/10/2016   Essential hypertension 06/10/2016   Current every day smoker 06/10/2016     Current Outpatient Medications on File Prior to Visit  Medication Sig Dispense Refill   albuterol (PROVENTIL) (2.5 MG/3ML) 0.083% nebulizer solution Use 1 vial (2.5 mg total) by nebulization every 4 (four) hours as needed for wheezing or shortness of breath. 90 mL 12   Multiple Vitamin (MULTIVITAMIN WITH MINERALS) TABS tablet Take 1 tablet by mouth daily.     naloxone [facility]) 2 MG/2ML injection Inject if unconscious or not breathing 2 mL 0   No current facility-administered medications on file prior to visit.    No Known Allergies  Social History   Socioeconomic History   Marital status: Single    Spouse name: Not on file   Number of children: Not on file   Years of education: Not on file   Highest education level: Not on file  Occupational  History   Not on file  Tobacco Use   Smoking status: Every Day    Packs/day: 0.50    Years: 45.00    Pack years: 22.50    Types: Cigarettes   Smokeless tobacco: Never  Substance and Sexual Activity   Alcohol use: Yes    Alcohol/week: 14.0 standard drinks    Types: 14 Cans of beer per week   Drug use: Not Currently    Types: "Crack" cocaine   Sexual activity: Yes    Partners: Female  Other Topics Concern   Not on file  Social History Narrative   Not on file    Social Determinants of Health   Financial Resource Strain: Not on file  Food Insecurity: Not on file  Transportation Needs: Not on file  Physical Activity: Not on file  Stress: Not on file  Social Connections: Not on file  Intimate Partner Violence: Not on file    Family History  Problem Relation Age of Onset   Hypertension Mother    Breast cancer Sister    Hypertension Sister    Colon cancer Brother    Colon polyps Neg Hx    Esophageal cancer Neg Hx    Rectal cancer Neg Hx    Stomach cancer Neg Hx     Past Surgical History:  Procedure Laterality Date   INCISE AND DRAIN ABCESS     dog bite    ROS: Review of Systems Negative except as stated above  PHYSICAL EXAM: BP 130/78    Pulse 70    Resp 16    Wt 119 lb 6.4 oz (54.2 kg)    SpO2 96%    BMI 17.63 kg/m   Wt Readings from Last 3 Encounters:  03/23/21 119 lb 6.4 oz (54.2 kg)  02/22/21 121 lb 7.6 oz (55.1 kg)  08/27/20 120 lb (54.4 kg)    Physical Exam   General appearance - alert, well appearing, older African-American male and in no distress Mental status - normal mood, behavior, speech, dress, motor activity, and thought processes Neck - supple, no significant adenopathy Chest -no wheezes or rhonchi is heard.  Good air entry and equal bilaterally Heart - normal rate, regular rhythm, normal S1, S2, no murmurs, rubs, clicks or gallops Extremities -no lower extremity edema.  CMP Latest Ref Rng & Units 02/22/2021 02/21/2021 02/20/2021  Glucose 70 - 99 mg/dL 101(H) 100(H) 106(H)  BUN 8 - 23 mg/dL 27(H) 24(H) 18  Creatinine 0.61 - 1.24 mg/dL 1.03 1.02 1.08  Sodium 135 - 145 mmol/L 137 140 140  Potassium 3.5 - 5.1 mmol/L 4.0 4.1 3.9  Chloride 98 - 111 mmol/L 104 105 108  CO2 22 - 32 mmol/L 26 26 23   Calcium 8.9 - 10.3 mg/dL 9.7 9.3 9.7  Total Protein 6.5 - 8.1 g/dL - - -  Total Bilirubin 0.3 - 1.2 mg/dL - - -  Alkaline Phos 38 - 126 U/L - - -  AST 15 - 41 U/L - - -  ALT 0 - 44 U/L - - -   Lipid Panel      Component Value Date/Time   CHOL 171 09/06/2019 1053   TRIG 57 09/06/2019 1053   HDL 70 09/06/2019 1053   CHOLHDL 2.4 09/06/2019 1053   LDLCALC 90 09/06/2019 1053    CBC    Component Value Date/Time   WBC 7.7 02/22/2021 0426   RBC 4.43 02/22/2021 0426   HGB 14.6 02/22/2021 0426   HGB 17.2 09/06/2019 1053  HCT 43.1 02/22/2021 0426   HCT 49.9 09/06/2019 1053   PLT 267 02/22/2021 0426   PLT 274 09/06/2019 1053   MCV 97.3 02/22/2021 0426   MCV 98 (H) 09/06/2019 1053   MCH 33.0 02/22/2021 0426   MCHC 33.9 02/22/2021 0426   RDW 13.5 02/22/2021 0426   RDW 11.5 (L) 09/06/2019 1053   LYMPHSABS 4.2 (H) 02/18/2021 1345   LYMPHSABS 2.5 06/13/2016 1110   MONOABS 0.7 02/18/2021 1345   EOSABS 0.5 02/18/2021 1345   EOSABS 0.1 06/13/2016 1110   BASOSABS 0.1 02/18/2021 1345   BASOSABS 0.0 06/13/2016 1110    ASSESSMENT AND PLAN:  1. Hospital discharge follow-up  2. Centrilobular emphysema (HCC) No wheezing on exam today.  Pulse ox is acceptable on room air.  Refill given on Breo, Tessalon Perles and albuterol.  Advised him to use the Breo inhaler every day as prescribed. - albuterol (VENTOLIN HFA) 108 (90 Base) MCG/ACT inhaler; Inhale 2 puffs into the lungs every 6 (six) hours as needed for wheezing or shortness of breath.  Dispense: 8 g; Refill: 5 - benzonatate (TESSALON) 100 MG capsule; Take 1 capsule (100 mg total) by mouth 2 (two) times daily as needed for cough.  Dispense: 20 capsule; Refill: 0 - fluticasone furoate-vilanterol (BREO ELLIPTA) 200-25 MCG/ACT AEPB; Inhale 1 puff into the lungs daily.  Dispense: 60 each; Refill: 6  3. Tobacco dependence Strongly advised to quit smoking.  Patient states he is working on this.  Refill sent to the pharmacy on 7 mg nicotine patches.  1 minutes spent on counseling. - nicotine (NICODERM CQ - DOSED IN MG/24 HR) 7 mg/24hr patch; Place 1 patch (7 mg total) onto the skin daily.  Dispense: 28 patch; Refill: 0  4. Essential hypertension At  goal.  Continue amlodipine.  Refill given.  I have sent a 76-month supply to the pharmacy so that he does not have to come to the pharmacy every month for refills. - amLODipine (NORVASC) 5 MG tablet; Take 1 tablet (5 mg total) by mouth daily.  Dispense: 90 tablet; Refill: 2  5. Pure hypercholesterolemia - atorvastatin (LIPITOR) 10 MG tablet; Take 1 tablet (10 mg total) by mouth daily.  Dispense: 90 tablet; Refill: 2  6. Screening for colon cancer I have resubmitted referral to the gastroenterologist Dr. Silverio Decamp who did his last colonoscopy. - Ambulatory referral to Gastroenterology  7. History of colon polyps - Ambulatory referral to Gastroenterology  8. Need for zoster vaccination Patient given prescription for him to get his second Shingrix shot at any outside pharmacy. - Zoster Vaccine Adjuvanted [facility]) injection; Inject 0.5 mLs into the muscle once for 1 dose.  Dispense: 0.5 mL; Refill: 0   Patient was given the opportunity to ask questions.  Patient verbalized understanding of the plan and was able to repeat key elements of the plan.   Orders Placed This Encounter  Procedures   Ambulatory referral to Gastroenterology     Requested Prescriptions   Signed Prescriptions Disp Refills   albuterol (VENTOLIN HFA) 108 (90 Base) MCG/ACT inhaler 8 g 5    Sig: Inhale 2 puffs into the lungs every 6 (six) hours as needed for wheezing or shortness of breath.   nicotine (NICODERM CQ - DOSED IN MG/24 HR) 7 mg/24hr patch 28 patch 0    Sig: Place 1 patch (7 mg total) onto the skin daily.   amLODipine (NORVASC) 5 MG tablet 90 tablet 2    Sig: Take 1 tablet (5 mg total) by mouth  daily.   atorvastatin (LIPITOR) 10 MG tablet 90 tablet 2    Sig: Take 1 tablet (10 mg total) by mouth daily.   benzonatate (TESSALON) 100 MG capsule 20 capsule 0    Sig: Take 1 capsule (100 mg total) by mouth 2 (two) times daily as needed for cough.   fluticasone furoate-vilanterol (BREO ELLIPTA) 200-25 MCG/ACT  AEPB 60 each 6    Sig: Inhale 1 puff into the lungs daily.   Zoster Vaccine Adjuvanted [facility]) injection 0.5 mL 0    Sig: Inject 0.5 mLs into the muscle once for 1 dose.    Return in about 4 months (around 07/21/2021).  Karle Plumber, MD, FACP

## 2021-04-30 ENCOUNTER — Inpatient Hospital Stay (HOSPITAL_COMMUNITY)
Admission: EM | Admit: 2021-04-30 | Discharge: 2021-05-03 | DRG: 190 | Disposition: A | Discharge status: Home / Self Care | Payer: Medicare Other | Attending: Internal Medicine | Admitting: Internal Medicine

## 2021-04-30 ENCOUNTER — Other Ambulatory Visit: Payer: Self-pay

## 2021-04-30 ENCOUNTER — Emergency Department (HOSPITAL_COMMUNITY): Payer: Medicare Other

## 2021-04-30 ENCOUNTER — Encounter (HOSPITAL_COMMUNITY): Payer: Self-pay

## 2021-04-30 DIAGNOSIS — R000 Tachycardia, unspecified: Secondary | ICD-10-CM | POA: Diagnosis not present

## 2021-04-30 DIAGNOSIS — J441 Chronic obstructive pulmonary disease with (acute) exacerbation: Principal | ICD-10-CM | POA: Diagnosis present

## 2021-04-30 DIAGNOSIS — D72829 Elevated white blood cell count, unspecified: Secondary | ICD-10-CM | POA: Diagnosis present

## 2021-04-30 DIAGNOSIS — Z20822 Contact with and (suspected) exposure to covid-19: Secondary | ICD-10-CM | POA: Diagnosis present

## 2021-04-30 DIAGNOSIS — Z8249 Family history of ischemic heart disease and other diseases of the circulatory system: Secondary | ICD-10-CM

## 2021-04-30 DIAGNOSIS — Z800 Family history of malignant neoplasm of digestive organs: Secondary | ICD-10-CM

## 2021-04-30 DIAGNOSIS — R6889 Other general symptoms and signs: Secondary | ICD-10-CM | POA: Diagnosis not present

## 2021-04-30 DIAGNOSIS — Z7951 Long term (current) use of inhaled steroids: Secondary | ICD-10-CM | POA: Diagnosis not present

## 2021-04-30 DIAGNOSIS — Z8619 Personal history of other infectious and parasitic diseases: Secondary | ICD-10-CM

## 2021-04-30 DIAGNOSIS — E785 Hyperlipidemia, unspecified: Secondary | ICD-10-CM | POA: Diagnosis present

## 2021-04-30 DIAGNOSIS — F1010 Alcohol abuse, uncomplicated: Secondary | ICD-10-CM | POA: Diagnosis present

## 2021-04-30 DIAGNOSIS — R0603 Acute respiratory distress: Secondary | ICD-10-CM | POA: Diagnosis present

## 2021-04-30 DIAGNOSIS — F17200 Nicotine dependence, unspecified, uncomplicated: Secondary | ICD-10-CM | POA: Diagnosis not present

## 2021-04-30 DIAGNOSIS — Z803 Family history of malignant neoplasm of breast: Secondary | ICD-10-CM | POA: Diagnosis not present

## 2021-04-30 DIAGNOSIS — J9601 Acute respiratory failure with hypoxia: Secondary | ICD-10-CM | POA: Diagnosis present

## 2021-04-30 DIAGNOSIS — I10 Essential (primary) hypertension: Secondary | ICD-10-CM | POA: Diagnosis present

## 2021-04-30 DIAGNOSIS — R9431 Abnormal electrocardiogram [ECG] [EKG]: Secondary | ICD-10-CM | POA: Diagnosis not present

## 2021-04-30 DIAGNOSIS — Z743 Need for continuous supervision: Secondary | ICD-10-CM | POA: Diagnosis not present

## 2021-04-30 DIAGNOSIS — R0689 Other abnormalities of breathing: Secondary | ICD-10-CM | POA: Diagnosis not present

## 2021-04-30 DIAGNOSIS — I499 Cardiac arrhythmia, unspecified: Secondary | ICD-10-CM | POA: Diagnosis not present

## 2021-04-30 DIAGNOSIS — F17210 Nicotine dependence, cigarettes, uncomplicated: Secondary | ICD-10-CM | POA: Diagnosis present

## 2021-04-30 DIAGNOSIS — Z79899 Other long term (current) drug therapy: Secondary | ICD-10-CM

## 2021-04-30 DIAGNOSIS — J432 Centrilobular emphysema: Secondary | ICD-10-CM

## 2021-04-30 LAB — I-STAT VENOUS BLOOD GAS, ED
Acid-Base Excess: 1 mmol/L (ref 0.0–2.0)
Acid-Base Excess: 0 mmol/L (ref 0.0–2.0)
Bicarbonate: 29.1 mmol/L — ABNORMAL HIGH (ref 20.0–28.0)
Bicarbonate: 25.6 mmol/L (ref 20.0–28.0)
Calcium, Ion: 1.18 mmol/L (ref 1.15–1.40)
Calcium, Ion: 1.22 mmol/L (ref 1.15–1.40)
HCT: 46 % (ref 39.0–52.0)
HCT: 40 % (ref 39.0–52.0)
Hemoglobin: 15.6 g/dL (ref 13.0–17.0)
Hemoglobin: 13.6 g/dL (ref 13.0–17.0)
O2 Saturation: 99 %
O2 Saturation: 90 %
Potassium: 3.7 mmol/L (ref 3.5–5.1)
Potassium: 3.5 mmol/L (ref 3.5–5.1)
Sodium: 141 mmol/L (ref 135–145)
Sodium: 143 mmol/L (ref 135–145)
TCO2: 31 mmol/L (ref 22–32)
TCO2: 27 mmol/L (ref 22–32)
pCO2, Ven: 59.7 mm[Hg] (ref 44.0–60.0)
pCO2, Ven: 44.1 mm[Hg] (ref 44.0–60.0)
pH, Ven: 7.296 (ref 7.250–7.430)
pH, Ven: 7.371 (ref 7.250–7.430)
pO2, Ven: 142 mm[Hg] — ABNORMAL HIGH (ref 32.0–45.0)
pO2, Ven: 60 mm[Hg] — ABNORMAL HIGH (ref 32.0–45.0)

## 2021-04-30 LAB — CBC WITH DIFFERENTIAL/PLATELET
Abs Immature Granulocytes: 0.01 10*3/uL (ref 0.00–0.07)
Basophils Absolute: 0.1 10*3/uL (ref 0.0–0.1)
Basophils Relative: 1 %
Eosinophils Absolute: 0.9 10*3/uL — ABNORMAL HIGH (ref 0.0–0.5)
Eosinophils Relative: 9 %
HCT: 45.1 % (ref 39.0–52.0)
Hemoglobin: 14.9 g/dL (ref 13.0–17.0)
Immature Granulocytes: 0 %
Lymphocytes Relative: 54 %
Lymphs Abs: 5.6 10*3/uL — ABNORMAL HIGH (ref 0.7–4.0)
MCH: 33.4 pg (ref 26.0–34.0)
MCHC: 33 g/dL (ref 30.0–36.0)
MCV: 101.1 fL — ABNORMAL HIGH (ref 80.0–100.0)
Monocytes Absolute: 0.8 10*3/uL (ref 0.1–1.0)
Monocytes Relative: 7 %
Neutro Abs: 3 10*3/uL (ref 1.7–7.7)
Neutrophils Relative %: 29 %
Platelets: 282 10*3/uL (ref 150–400)
RBC: 4.46 MIL/uL (ref 4.22–5.81)
RDW: 13.6 % (ref 11.5–15.5)
WBC: 10.4 10*3/uL (ref 4.0–10.5)
nRBC: 0 % (ref 0.0–0.2)

## 2021-04-30 LAB — BASIC METABOLIC PANEL
Anion gap: 11 (ref 5–15)
BUN: 15 mg/dL (ref 8–23)
CO2: 23 mmol/L (ref 22–32)
Calcium: 9.5 mg/dL (ref 8.9–10.3)
Chloride: 106 mmol/L (ref 98–111)
Creatinine, Ser: 0.95 mg/dL (ref 0.61–1.24)
GFR, Estimated: >60 mL/min (ref >60)
Glucose, Bld: 205 mg/dL — ABNORMAL HIGH (ref 70–99)
Potassium: 3.8 mmol/L (ref 3.5–5.1)
Sodium: 140 mmol/L (ref 135–145)

## 2021-04-30 LAB — RESP PANEL BY RT-PCR (FLU A&B, COVID) ARPGX2
Influenza A by PCR: NEGATIVE
Influenza B by PCR: NEGATIVE
SARS Coronavirus 2 by RT PCR: NEGATIVE

## 2021-04-30 LAB — BRAIN NATRIURETIC PEPTIDE: B Natriuretic Peptide: 65.9 pg/mL (ref 0.0–100.0)

## 2021-04-30 MED ORDER — METHYLPREDNISOLONE SODIUM SUCC 40 MG IJ SOLR: 40.0000 mg | Freq: Every day | INTRAMUSCULAR | Status: DC

## 2021-04-30 MED ORDER — ALBUTEROL SULFATE (2.5 MG/3ML) 0.083% IN NEBU
10.0000 mg/h | INHALATION_SOLUTION | Freq: Once | RESPIRATORY_TRACT | Status: AC
  Administered 2021-04-30: 10 mg/h via RESPIRATORY_TRACT
  Filled 2021-04-30: qty 12

## 2021-04-30 MED ORDER — IPRATROPIUM-ALBUTEROL 0.5-2.5 (3) MG/3ML IN SOLN: 3.0000 mL | Freq: Four times a day (QID) | RESPIRATORY_TRACT | Status: DC | PRN

## 2021-04-30 MED ORDER — ENOXAPARIN SODIUM 40 MG/0.4ML IJ SOSY
40.0000 mg | PREFILLED_SYRINGE | Freq: Every day | INTRAMUSCULAR | Status: DC
  Administered 2021-05-01 – 2021-05-03 (×3): 40 mg via SUBCUTANEOUS
  Filled 2021-04-30 (×3): qty 0.4

## 2021-04-30 NOTE — H&P (Signed)
, History and Physical    Patient: Jason Wong UJW:[id number] DOB: 1955-11-26 DOA: 04/30/2021 DOS: the patient was seen and examined on 04/30/2021 PCP: Ladell Pier, MD  Patient coming from: Home  Chief Complaint: shortness of breath Chief Complaint  Patient presents with   Respiratory Distress    HPI: Jason Wong is a 66 y.o. male with medical history significant of COPD with ongoing tobacco use, asthma, hypertension treated hepatitis C who presents with concerns of increasing shortness of breath.  Patient was sitting today when he suddenly developed acutely worsening shortness of breath.  Reports running out of his inhaler about a week ago due to financial reasons.  Has mild nonproductive cough.  Denies any fever.  No chest pain.  No nausea, vomiting or diarrhea.  Patient continues to smoke 3 cigarettes/day.  He was last admitted for COPD exacerbation in December 2022 also due to medication noncompliance.  In the ED, he reportedly was in respiratory distress, tripoding and could not talk.  He was placed on BiPAP with significant improvement.  Has mild leukocytosis of 10.4, hemoglobin of 14.9.  No electrolyte abnormalities.  BG mildly elevated 205. Negative flu/COVID PCR  Chest x-ray negative  EKG on my review with normal sinus rhythm and slight ST depression in V5 to V6 which is seen on prior EKG  Review of Systems: As mentioned in the history of present illness. All other systems reviewed and are negative. Past Medical History:  Diagnosis Date   Asthma    Hepatitis C antibody positive in blood    HTN (hypertension)    Past Surgical History:  Procedure Laterality Date   INCISE AND DRAIN ABCESS     dog bite   Social History:  reports that he has been smoking cigarettes. He has a 22.50 pack-year smoking history. He has never used smokeless tobacco. He reports current alcohol use of about 14.0 standard drinks per week. He reports that he does not currently use drugs after  having used the following drugs: "Crack" cocaine.  Allergies  Allergen Reactions   Tobacco Shortness Of Breath and Other (See Comments)    CIGARETTE SMOKE TRIGGERS THE PATIENT'S RESPIRATORY ISSUES!!    Family History  Problem Relation Age of Onset   Hypertension Mother    Breast cancer Sister    Hypertension Sister    Colon cancer Brother    Colon polyps Neg Hx    Esophageal cancer Neg Hx    Rectal cancer Neg Hx    Stomach cancer Neg Hx     Prior to Admission medications   Medication Sig Start Date End Date Taking? Authorizing Provider  albuterol (PROVENTIL) (2.5 MG/3ML) 0.083% nebulizer solution Use 1 vial (2.5 mg total) by nebulization every 4 (four) hours as needed for wheezing or shortness of breath. 02/22/21   Demaio, Alexa, MD  albuterol (VENTOLIN HFA) 108 (90 Base) MCG/ACT inhaler Inhale 2 puffs into the lungs every 6 (six) hours as needed for wheezing or shortness of breath. 03/23/21   Ladell Pier, MD  amLODipine (NORVASC) 5 MG tablet Take 1 tablet (5 mg total) by mouth daily. 03/23/21   Ladell Pier, MD  atorvastatin (LIPITOR) 10 MG tablet Take 1 tablet (10 mg total) by mouth daily. 03/23/21   Ladell Pier, MD  benzonatate (TESSALON) 100 MG capsule Take 1 capsule (100 mg total) by mouth 2 (two) times daily as needed for cough. 03/23/21   Ladell Pier, MD  fluticasone furoate-vilanterol (BREO ELLIPTA) 200-25 MCG/ACT AEPB  Inhale 1 puff into the lungs daily. 03/23/21   Ladell Pier, MD  Multiple Vitamin (MULTIVITAMIN WITH MINERALS) TABS tablet Take 1 tablet by mouth daily.    [provider]  naloxone [facility] Greaser) 2 MG/2ML injection Inject if unconscious or not breathing 08/21/20   Pearson Grippe, DO  nicotine (NICODERM CQ - DOSED IN MG/24 HR) 7 mg/24hr patch Place 1 patch (7 mg total) onto the skin daily. 03/23/21   Ladell Pier, MD    Physical Exam: Vitals:   04/30/21 2130 04/30/21 2145 04/30/21 2300 04/30/21 2330  BP: 98/75 101/72 109/80  109/80  Pulse: 75 75 71 79  Resp: 14 12 15  (!) 26  Temp:      TempSrc:      SpO2: 100% 100% 100% 100%   Constitutional: NAD, calm, comfortable, thin elderly male laying at 20 degree incline on Bipap Eyes: PERRL, lids and conjunctivae normal ENMT: Mucous membranes are moist. Neck: normal, supple Respiratory: clear to auscultation bilaterally, no wheezing, no crackles while on BiPAP. Normal respiratory effort. No accessory muscle use.  Able to speak in full sentences. Cardiovascular: Regular rate and rhythm, no murmurs / rubs / gallops. No extremity edema.  Abdomen: no tenderness,  Bowel sounds positive.  Musculoskeletal: no clubbing / cyanosis. No joint deformity upper and lower extremities. Skin: no rashes, lesions, ulcers.  Neurologic: CN 2-12 grossly intact. Strength 5/5 in all 4.  Psychiatric: Normal judgment and insight. Alert and oriented x 3. Normal mood.  Data Reviewed:  Chest x-ray negative  EKG on my review with normal sinus rhythm and slight ST depression in V5 to V6 which is seen on prior EKG  Assessment and Plan:  Acute hypoxic respiratory failure secondary to COPD exacerbation with ongoing tobacco use -Admitted on BiPAP with improvement of symptoms.  COPD exacerbation due to noncompliance with medication and ongoing tobacco use -Start daily IV Solu-Medrol -PRN Duo-nebs -wean Bipap as tolerated to maintain O2 at 88-92%  HTN Controlled. Continue home meds  HLD Continue statin  Medication reconciliation pending at time of admission    Advance Care Planning:   Code Status: Full Code   Consults: none  Family Communication: No family at bedside  Severity of Illness: Admit - It is my clinical opinion that admission to INPATIENT is reasonable and necessary because this patient will require at least 2 midnights in the hospital to treat this condition based on the medical complexity of the problems presented.  Given the aforementioned information, the  predictability of an adverse outcome is felt to be significant.  Author: Orene Desanctis, DO 04/30/2021 11:45 PM  For on call review [email or web address].

## 2021-04-30 NOTE — ED Triage Notes (Signed)
Pt BIB EMS for Respiratory distress. EMS reports that when they got the call pt said they had been having trouble breathing for the past 2 hours, and was using their daily inhaler to help. Upon EMS arrival, pt's O2 Sat was 89% on RA. Pt states that smoking triggers his respiratory problems and that he and his family continue to smoke in the house.   BP: 230/140, O2: 100% on 8L

## 2021-04-30 NOTE — Progress Notes (Signed)
Pt placed on Bipap by RT. Pt tolerating well at this time, RN at bedside, MD aware, RT will continue to monitor.

## 2021-04-30 NOTE — ED Provider Notes (Signed)
[facility] EMERGENCY DEPARTMENT Provider Note   CSN: [id number] Arrival date & time: 04/30/21  1826     History  Chief Complaint  Patient presents with   Respiratory Distress    Jason Wong is a 66 y.o. male.  HPI    66 year old male with history of COPD, hypertension, tobacco use disorder comes in with chief complaint of acute shortness of breath.  Patient indicates that about 3 hours ago he started having acute shortness of breath.  He was smoking a cigarette at that time.  He has no associated chest pain.  He took some inhalers at home, without any significant relief  Per EMS, when they arrived patient was hypoxic, tripoding and diaphoretic.  He was in respiratory distress.  They gave him 10 mg of albuterol, ipratropium, magnesium and Solu-Medrol prior to ED arrival.  Patient is now moving his air better-but continues to have respiratory distress.  Patient denies any recent illnesses, fevers or chills.   Home Medications Prior to Admission medications   Medication Sig Start Date End Date Taking? Authorizing Provider  albuterol (PROVENTIL) (2.5 MG/3ML) 0.083% nebulizer solution Use 1 vial (2.5 mg total) by nebulization every 4 (four) hours as needed for wheezing or shortness of breath. 02/22/21   Demaio, Alexa, MD  albuterol (VENTOLIN HFA) 108 (90 Base) MCG/ACT inhaler Inhale 2 puffs into the lungs every 6 (six) hours as needed for wheezing or shortness of breath. 03/23/21   Ladell Pier, MD  amLODipine (NORVASC) 5 MG tablet Take 1 tablet (5 mg total) by mouth daily. 03/23/21   Ladell Pier, MD  atorvastatin (LIPITOR) 10 MG tablet Take 1 tablet (10 mg total) by mouth daily. 03/23/21   Ladell Pier, MD  benzonatate (TESSALON) 100 MG capsule Take 1 capsule (100 mg total) by mouth 2 (two) times daily as needed for cough. 03/23/21   Ladell Pier, MD  fluticasone furoate-vilanterol (BREO ELLIPTA) 200-25 MCG/ACT AEPB Inhale 1 puff into the lungs  daily. 03/23/21   Ladell Pier, MD  Multiple Vitamin (MULTIVITAMIN WITH MINERALS) TABS tablet Take 1 tablet by mouth daily.    [provider]  naloxone Karma Greaser) 2 MG/2ML injection Inject if unconscious or not breathing 08/21/20   Pearson Grippe, DO  nicotine (NICODERM CQ - DOSED IN MG/24 HR) 7 mg/24hr patch Place 1 patch (7 mg total) onto the skin daily. 03/23/21   Ladell Pier, MD      Allergies    Tobacco    Review of Systems   Review of Systems  All other systems reviewed and are negative.  Physical Exam Updated Vital Signs BP 109/80    Pulse 71    Temp (!) 97.4 F (36.3 C) (Oral)    Resp 15    SpO2 100%  Physical Exam Vitals and nursing note reviewed.  Constitutional:      General: He is in acute distress.     Appearance: He is well-developed. He is diaphoretic.  HENT:     Head: Atraumatic.  Cardiovascular:     Rate and Rhythm: Tachycardia present.  Pulmonary:     Effort: Respiratory distress present.     Breath sounds: Wheezing present.     Comments: Generalized wheezing, poor aeration Musculoskeletal:     Cervical back: Neck supple.     Right lower leg: No edema.     Left lower leg: No edema.  Skin:    General: Skin is warm.  Neurological:  Mental Status: He is alert and oriented to person, place, and time.    ED Results / Procedures / Treatments   Labs (all labs ordered are listed, but only abnormal results are displayed) Labs Reviewed  BASIC METABOLIC PANEL - Abnormal; Notable for the following components:      Result Value   Glucose, Bld 205 (*)    All other components within normal limits  CBC WITH DIFFERENTIAL/PLATELET - Abnormal; Notable for the following components:   MCV 101.1 (*)    Lymphs Abs 5.6 (*)    Eosinophils Absolute 0.9 (*)    All other components within normal limits  I-STAT VENOUS BLOOD GAS, ED - Abnormal; Notable for the following components:   pO2, Ven 142.0 (*)    Bicarbonate 29.1 (*)    All other components  within normal limits  I-STAT VENOUS BLOOD GAS, ED - Abnormal; Notable for the following components:   pO2, Ven 60.0 (*)    All other components within normal limits  RESP PANEL BY RT-PCR (FLU A&B, COVID) [id number]  BRAIN NATRIURETIC PEPTIDE    EKG EKG Interpretation  Date/Time:  Friday April 30 2021 18:51:10 EST Ventricular Rate:  88 PR Interval:  141 QRS Duration: 84 QT Interval:  354 QTC Calculation: 429 R Axis:   90 Text Interpretation: Sinus rhythm Biatrial enlargement Borderline right axis deviation ST depr, consider ischemia, inferior leads No significant change since last tracing Confirmed by Varney Biles ([id number]) on 04/30/2021 7:00:17 PM  Radiology DG Chest Port 1 View  Result Date: 04/30/2021 CLINICAL DATA:  Trouble breathing EXAM: PORTABLE CHEST 1 VIEW COMPARISON:  02/18/2021 FINDINGS: The heart size and mediastinal contours are within normal limits. Both lungs are clear. The visualized skeletal structures are unremarkable. IMPRESSION: No active disease. Electronically Signed   By: Donavan Foil M.D.   On: 04/30/2021 20:13    Procedures .Critical Care Performed by: Varney Biles, MD Authorized by: Varney Biles, MD   Critical care provider statement:    Critical care time (minutes):  42   Critical care was necessary to treat or prevent imminent or life-threatening deterioration of the following conditions:  Respiratory failure   Critical care was time spent personally by me on the following activities:  Development of treatment plan with patient or surrogate, discussions with consultants, evaluation of patient's response to treatment, examination of patient, ordering and review of laboratory studies, ordering and review of radiographic studies, ordering and performing treatments and interventions, pulse oximetry, re-evaluation of patient's condition and review of old charts    Medications Ordered in ED Medications  albuterol (PROVENTIL) (2.5 MG/3ML) 0.083%  nebulizer solution (10 mg/hr Nebulization Given 04/30/21 1851)    ED Course/ Medical Decision Making/ A&P Clinical Course as of 04/30/21 2314  Fri Apr 30, 2021  2155 Resp Panel by RT-PCR (Flu A&B, Covid) Nasopharyngeal Swab Covid/flu negative [AN]  2155 I-Stat venous blood gas, [facility] ED)(!) Patient has respiratory acidosis.  He is appropriately on BiPAP. Repeat venous blood gas ordered. [AN]  2156 DG Chest Port 1 View Chest x-ray independently reviewed.  Negative for any pneumothorax. [AN]  2156 Patient reassessed.  He is breathing a lot comfortably now.  He is resting.  He is not comfortable coming off of BiPAP.  We will proceed with admission.  I will order a repeat venous blood gas to ensure that he is compensating well.  Spoke with Dr. Flossie Buffy -she will admit.  Acute shortness of breath, no new cough, I have not started patient on  doxycycline.  [AN]    Clinical Course User Index [AN] Varney Biles, MD                           Medical Decision Making Amount and/or Complexity of Data Reviewed Labs: ordered. Decision-making details documented in ED Course. Radiology: ordered. Decision-making details documented in ED Course.  Risk Prescription drug management. Decision regarding hospitalization.   This patient presents to the ED with chief complaint(s) of shortness of breath and respiratory distress with pertinent past medical history of COPD exacerbation, tobacco use disorder which further complicates the presenting complaint. The complaint involves an extensive differential diagnosis and treatment options and also carries with it a high risk of complications and morbidity.    The differential diagnosis includes : COPD exacerbation, pneumothorax, hypertensive emergency, pulmonary edema, CHF, COVID-19, flu, pneumonia.  The initial plan is to order BiPAP for patient's respiratory distress along with basic labs and chest x-ray and to treat patient with continuous albuterol for his  wheezing and shortness of breath.  Additional Tests and treatment considered: Pretest probability for ACS is low.  Patient had elevated troponin during his admission last time, which were thought to be secondary to demand ischemia, therefore do not feel that troponins are indicated.  Additional history obtained: Additional history obtained from EMS  Records reviewed previous admission documents  Reassessment and review (also see workup area): Lab Tests: I Ordered, and personally interpreted labs.  The pertinent results include: Labs showing respiratory acidosis, normal CBC  Imaging Studies: I ordered and independently visualized and interpreted the following imaging X-ray of the chest   which showed no evidence of pneumothorax. The interpretation of the imaging was limited to assessing for emergent pathology, for which purpose it was ordered.  Reevaluation of the patient after these medicines showed that the patient    improved   Cardiac Monitoring: The patient was maintained on a cardiac monitor.  I personally viewed and interpreted the cardiac monitor which showed an underlying rhythm of:  sinus rhythm and sinus tachycardia  Complexity of problems addressed: Patients presentation is most consistent with  acute presentation with potential threat to life or bodily function and severe exacerbation of chronic illness During patient's assessment  Disposition: After consideration of the diagnostic results and the patients response to treatment,  I feel that the patent would benefit from admission   .    Final Clinical Impression(s) / ED Diagnoses Final diagnoses:  COPD exacerbation ([place])  Respiratory distress    Rx / DC Orders ED Discharge Orders     None         Varney Biles, MD 04/30/21 2314

## 2021-05-01 DIAGNOSIS — F1010 Alcohol abuse, uncomplicated: Secondary | ICD-10-CM | POA: Diagnosis present

## 2021-05-01 LAB — CBC
HCT: 39.8 % (ref 39.0–52.0)
Hemoglobin: 13.3 g/dL (ref 13.0–17.0)
MCH: 33.5 pg (ref 26.0–34.0)
MCHC: 33.4 g/dL (ref 30.0–36.0)
MCV: 100.3 fL — ABNORMAL HIGH (ref 80.0–100.0)
Platelets: 264 10*3/uL (ref 150–400)
RBC: 3.97 MIL/uL — ABNORMAL LOW (ref 4.22–5.81)
RDW: 13.7 % (ref 11.5–15.5)
WBC: 4.9 10*3/uL (ref 4.0–10.5)
nRBC: 0 % (ref 0.0–0.2)

## 2021-05-01 LAB — BASIC METABOLIC PANEL
Anion gap: 13 (ref 5–15)
BUN: 17 mg/dL (ref 8–23)
CO2: 24 mmol/L (ref 22–32)
Calcium: 9.8 mg/dL (ref 8.9–10.3)
Chloride: 104 mmol/L (ref 98–111)
Creatinine, Ser: 1.17 mg/dL (ref 0.61–1.24)
GFR, Estimated: >60 mL/min (ref >60)
Glucose, Bld: 190 mg/dL — ABNORMAL HIGH (ref 70–99)
Potassium: 4.5 mmol/L (ref 3.5–5.1)
Sodium: 141 mmol/L (ref 135–145)

## 2021-05-01 MED ORDER — GUAIFENESIN ER 600 MG PO TB12
600.0000 mg | ORAL_TABLET | Freq: Two times a day (BID) | ORAL | Status: DC
  Administered 2021-05-01 – 2021-05-03 (×5): 600 mg via ORAL
  Filled 2021-05-01 (×5): qty 1

## 2021-05-01 MED ORDER — ATORVASTATIN CALCIUM 10 MG PO TABS
10.0000 mg | ORAL_TABLET | Freq: Every day | ORAL | Status: DC
  Administered 2021-05-01 – 2021-05-03 (×3): 10 mg via ORAL
  Filled 2021-05-01 (×3): qty 1

## 2021-05-01 MED ORDER — IPRATROPIUM-ALBUTEROL 0.5-2.5 (3) MG/3ML IN SOLN
3.0000 mL | Freq: Three times a day (TID) | RESPIRATORY_TRACT | Status: DC
  Administered 2021-05-01 – 2021-05-03 (×6): 3 mL via RESPIRATORY_TRACT
  Filled 2021-05-01 (×6): qty 3

## 2021-05-01 MED ORDER — ACETAMINOPHEN 325 MG PO TABS: 650.0000 mg | ORAL_TABLET | Freq: Four times a day (QID) | ORAL | Status: DC | PRN

## 2021-05-01 MED ORDER — ENSURE ENLIVE PO LIQD
237.0000 mL | Freq: Two times a day (BID) | ORAL | Status: DC
  Administered 2021-05-02 – 2021-05-03 (×3): 237 mL via ORAL

## 2021-05-01 MED ORDER — BUDESONIDE 0.25 MG/2ML IN SUSP
0.2500 mg | Freq: Two times a day (BID) | RESPIRATORY_TRACT | Status: DC
  Administered 2021-05-01 – 2021-05-03 (×5): 0.25 mg via RESPIRATORY_TRACT
  Filled 2021-05-01 (×5): qty 2

## 2021-05-01 MED ORDER — ONDANSETRON HCL 4 MG/2ML IJ SOLN: 4.0000 mg | Freq: Four times a day (QID) | INTRAMUSCULAR | Status: DC | PRN

## 2021-05-01 MED ORDER — ARFORMOTEROL TARTRATE 15 MCG/2ML IN NEBU
15.0000 ug | INHALATION_SOLUTION | Freq: Two times a day (BID) | RESPIRATORY_TRACT | Status: DC
  Administered 2021-05-01 – 2021-05-03 (×5): 15 ug via RESPIRATORY_TRACT
  Filled 2021-05-01 (×5): qty 2

## 2021-05-01 MED ORDER — METHYLPREDNISOLONE SODIUM SUCC 40 MG IJ SOLR
40.0000 mg | Freq: Two times a day (BID) | INTRAMUSCULAR | Status: DC
  Administered 2021-05-01 – 2021-05-02 (×3): 40 mg via INTRAVENOUS
  Filled 2021-05-01 (×3): qty 1

## 2021-05-01 MED ORDER — ALBUTEROL SULFATE (2.5 MG/3ML) 0.083% IN NEBU
2.5000 mg | INHALATION_SOLUTION | RESPIRATORY_TRACT | Status: DC | PRN
  Administered 2021-05-02: 2.5 mg via RESPIRATORY_TRACT
  Filled 2021-05-01: qty 3

## 2021-05-01 MED ORDER — AMLODIPINE BESYLATE 5 MG PO TABS
5.0000 mg | ORAL_TABLET | Freq: Every day | ORAL | Status: DC
  Administered 2021-05-01 – 2021-05-02 (×2): 5 mg via ORAL
  Filled 2021-05-01 (×2): qty 1

## 2021-05-01 NOTE — Hospital Course (Signed)
Patient is a 66 y.o.  male with history of COPD, ongoing tobacco use, HTN-who presented with shortness of breath-found to have severe respiratory distress requiring BiPAP due to COPD exacerbation.  See below for further details.  Significant Hospital events: 2/10>> admit for COPD-severe respiratory distress/hypoxia-required BiPAP. 2/11>> liberated off BiPAP.  Significant imaging studies: 2/10>> CXR: No PNA  Significant microbiology data: 2/10>> COVID/influenza PCR: Negative  Procedures: None  Consults:  None

## 2021-05-01 NOTE — TOC Initial Note (Signed)
Transition of Care [facility]) - Initial/Assessment Note    Patient Details  Name: Jason Wong MRN: [id number] Date of Birth: 1955-09-18  [facility] of [facility] CM/SW Contact:    Verdell Carmine, RN Phone Number: 05/01/2021, 4:33 PM  Clinical Narrative:                  66 year old male presented with [facility], exacerbation of COPD. Was on BiPap but liberated this morning.  Currently less SHOB with interventions. On 2-3 LPM oxygen.  Patient has a PCP . May need home oxygen, CM will continue to monitor for needs, recommendations, and transitions MD discussed smoking cessation.  .  Expected Discharge Plan: Home/Self Care Barriers to Discharge: Continued Medical Work up   Patient Goals and CMS Choice        Expected Discharge Plan and Services Expected Discharge Plan: Home/Self Care   Discharge Planning Services: CM Consult                                          Prior Living Arrangements/Services     Patient language and need for interpreter reviewed:: Yes        Need for Family Participation in Patient Care: Yes (Comment) Care giver support system in place?: Yes (comment)   Criminal Activity/Legal Involvement Pertinent to Current Situation/Hospitalization: No - Comment as needed  Activities of Daily Living      Permission Sought/Granted                  Emotional Assessment       Orientation: : Oriented to Self, Oriented to Place, Oriented to  Time, Oriented to Situation Alcohol / Substance Use: Tobacco Use Psych Involvement: No (comment)  Admission diagnosis:  Respiratory distress [R06.03] COPD exacerbation ([place]) [J44.1] Acute respiratory failure with hypoxia ([place]) [J96.01] Patient Active Problem List   Diagnosis Date Noted   ETOH abuse 05/01/2021   Acute respiratory failure with hypoxia due to COPD exacerbation 04/30/2021   HLD (hyperlipidemia) 04/30/2021   COPD (chronic obstructive pulmonary disease) with emphysema ([place]) 02/19/2021    COPD exacerbation ([place]) 02/18/2021   Memory deficit 04/28/2020   Hepatitis C virus infection cured after antiviral drug therapy 04/27/2018   Enuresis 04/27/2018   Centrilobular emphysema ([place]) 04/27/2018   Seasonal allergic rhinitis 04/27/2018   Tobacco dependence 08/24/2017   Weight loss, unintentional 08/24/2017   Insomnia 06/27/2017   Prediabetes 01/24/2017   Asthma 06/10/2016   Essential hypertension 06/10/2016   Current every day smoker 06/10/2016   PCP:  Ladell Pier, MD Pharmacy:   [place] [facility] [address] [place] [address] Phone: [phone] Fax: [phone]     Social Determinants of Health (SDOH) Interventions    Readmission Risk Interventions No flowsheet data found.

## 2021-05-01 NOTE — Assessment & Plan Note (Signed)
Counseled extensively.

## 2021-05-01 NOTE — Progress Notes (Signed)
Pt transported off bipap to room 35. Placed on [facility]. Pt tolerating well. Bipap on standby at bedside. Will continue to monitor.

## 2021-05-01 NOTE — Assessment & Plan Note (Addendum)
Due to COPD exacerbation-initially required BiPAP, now improved and currently on 3 L nasal cannula oxygen with minimal wheezing.  Still not close to his baseline, uses no oxygen at home.  Continue steroids but start tapering will add atypical coverage with azithromycin.  Wrongly encouraged to sit up in chair use I-S and flutter valve for pulmonary toiletry and ambulate multiple times in the room.  Patient appears to be noncompliant.  Also strictly counseled to quit smoking.

## 2021-05-01 NOTE — Progress Notes (Signed)
PROGRESS NOTE        PATIENT DETAILS Name: Jason Wong Age: 66 y.o. Sex: male Date of Birth: 1955-05-22 Admit Date: 04/30/2021 Admitting Physician Orene Desanctis, DO GGE:ZMOQHUT, Dalbert Batman, MD  Brief Summary: Patient is a 66 y.o.  male with history of COPD, ongoing tobacco use, HTN-who presented with shortness of breath-found to have severe respiratory distress requiring BiPAP due to COPD exacerbation.  See below for further details.  Significant Hospital events: 2/10>> admit for COPD-severe respiratory distress/hypoxia-required BiPAP. 2/11>> liberated off BiPAP.  Significant imaging studies: 2/10>> CXR: No PNA  Significant microbiology data: 2/10>> COVID/influenza PCR: Negative  Procedures: None  Consults:  None  Subjective: Breathing better-liberated off BiPAP earlier this morning.  Objective: Vitals: Blood pressure 112/76, pulse 66, temperature 98.5 F (36.9 C), temperature source Oral, resp. rate 13, SpO2 98 %.   Exam: Gen Exam:Alert awake-not in any distress HEENT:atraumatic, normocephalic Chest: Moving air-bilateral rhonchi all over. CVS:S1S2 regular Abdomen:soft non tender, non distended Extremities:no edema Neurology: Non focal Skin: no rash  Pertinent Labs/Radiology: CBC Latest Ref Rng & Units 05/01/2021 04/30/2021 04/30/2021  WBC 4.0 - 10.5 K/uL 4.9 - -  Hemoglobin 13.0 - 17.0 g/dL 13.3 13.6 15.6  Hematocrit 39.0 - 52.0 % 39.8 40.0 46.0  Platelets 150 - 400 K/uL 264 - -    Lab Results  Component Value Date   NA 141 05/01/2021   K 4.5 05/01/2021   CL 104 05/01/2021   CO2 24 05/01/2021    Assessment/Plan: * Acute respiratory failure with hypoxia due to COPD exacerbation- (present on admission) Due to COPD exacerbation-improving-liberated off BiPAP earlier this morning-stable on 2-3 L of oxygen-moving air but still with rhonchi-continue steroids/bronchodilators.  Since not yet at baseline-suspect needs another 1-2 days of  hospitalization before consideration of discharge.  Has been counseled regarding importance of stopping tobacco use.  Essential hypertension- (present on admission) Resume amlodipine and follow BP trend.  HLD (hyperlipidemia) Resume statin.  Current every day smoker- (present on admission) Counseled extensively.  ETOH abuse- (present on admission) Claims he drinks has several bottles of beer multiple times a week but not daily.  Claims he does not get withdrawal symptoms when he goes several days without drinking.  Watch closely-for signs of alcohol withdrawal and start CIWA protocol accordingly.  BMI: Estimated body mass index is 17.63 kg/m as calculated from the following:   Height as of 02/18/21: 5\' 9"  (1.753 m).   Weight as of 03/23/21: 54.2 kg.   Code status:   Code Status: Full Code   DVT Prophylaxis: enoxaparin (LOVENOX) injection 40 mg Start: 05/01/21 1000   Family Communication: None at bedside  Disposition Plan: Status is: Inpatient Remains inpatient appropriate because: Hypoxia with COPD exacerbation-improving but not yet back to baseline.     Planned Discharge Destination:Home likely in the next 1-2 days depending on clinical improvement.   Diet: Diet Order             Diet Heart Room service appropriate? Yes; Fluid consistency: Thin  Diet effective now                     Antimicrobial agents: Anti-infectives (From admission, onward)    None        MEDICATIONS: Scheduled Meds:  amLODipine  5 mg Oral Daily   arformoterol  15 mcg Nebulization BID  atorvastatin  10 mg Oral Daily   budesonide (PULMICORT) nebulizer solution  0.25 mg Nebulization BID   enoxaparin (LOVENOX) injection  40 mg Subcutaneous Daily   guaiFENesin  600 mg Oral BID   ipratropium-albuterol  3 mL Nebulization TID   methylPREDNISolone (SOLU-MEDROL) injection  40 mg Intravenous BID   Continuous Infusions: PRN Meds:.acetaminophen, albuterol, ondansetron (ZOFRAN) IV   I  have personally reviewed following labs and imaging studies  LABORATORY DATA: CBC: Recent Labs  Lab 04/30/21 1839 04/30/21 1911 04/30/21 2219 05/01/21 0500  WBC 10.4  --   --  4.9  NEUTROABS 3.0  --   --   --   HGB 14.9 15.6 13.6 13.3  HCT 45.1 46.0 40.0 39.8  MCV 101.1*  --   --  100.3*  PLT 282  --   --  417    Basic Metabolic Panel: Recent Labs  Lab 04/30/21 1839 04/30/21 1911 04/30/21 2219 05/01/21 0500  NA 140 141 143 141  K 3.8 3.7 3.5 4.5  CL 106  --   --  104  CO2 23  --   --  24  GLUCOSE 205*  --   --  190*  BUN 15  --   --  17  CREATININE 0.95  --   --  1.17  CALCIUM 9.5  --   --  9.8    GFR: CrCl cannot be calculated (Unknown ideal weight.).  Liver Function Tests: No results for input(s): AST, ALT, ALKPHOS, BILITOT, PROT, ALBUMIN in the last 168 hours. No results for input(s): LIPASE, AMYLASE in the last 168 hours. No results for input(s): AMMONIA in the last 168 hours.  Coagulation Profile: No results for input(s): INR, PROTIME in the last 168 hours.  Cardiac Enzymes: No results for input(s): CKTOTAL, CKMB, CKMBINDEX, TROPONINI in the last 168 hours.  BNP (last 3 results) No results for input(s): PROBNP in the last 8760 hours.  Lipid Profile: No results for input(s): CHOL, HDL, LDLCALC, TRIG, CHOLHDL, LDLDIRECT in the last 72 hours.  Thyroid Function Tests: No results for input(s): TSH, T4TOTAL, FREET4, T3FREE, THYROIDAB in the last 72 hours.  Anemia Panel: No results for input(s): VITAMINB12, FOLATE, FERRITIN, TIBC, IRON, RETICCTPCT in the last 72 hours.  Urine analysis: No results found for: COLORURINE, APPEARANCEUR, LABSPEC, PHURINE, GLUCOSEU, HGBUR, BILIRUBINUR, KETONESUR, PROTEINUR, UROBILINOGEN, NITRITE, LEUKOCYTESUR  Sepsis Labs: Lactic Acid, Venous    Component Value Date/Time   LATICACIDVEN 3.4 ([place]) 08/20/2020 2108    MICROBIOLOGY: Recent Results (from the past 240 hour(s))  Resp Panel by RT-PCR (Flu A&B, Covid)  Nasopharyngeal Swab     Status: None   Collection Time: 04/30/21  6:57 PM   Specimen: Nasopharyngeal Swab; Nasopharyngeal(NP) swabs in vial transport medium  Result Value Ref Range Status   SARS Coronavirus 2 by RT PCR NEGATIVE NEGATIVE Final    Comment: (NOTE) SARS-CoV-2 target nucleic acids are NOT DETECTED.  The SARS-CoV-2 RNA is generally detectable in upper respiratory specimens during the acute phase of infection. The lowest concentration of SARS-CoV-2 viral copies this assay can detect is 138 copies/mL. A negative result does not preclude SARS-Cov-2 infection and should not be used as the sole basis for treatment or other patient management decisions. A negative result may occur with  improper specimen collection/handling, submission of specimen other than nasopharyngeal swab, presence of viral mutation(s) within the areas targeted by this assay, and inadequate number of viral copies(<138 copies/mL). A negative result must be combined with clinical observations, patient history,  and epidemiological information. The expected result is Negative.  Fact Sheet for Patients:  EntrepreneurPulse.com.au  Fact Sheet for Healthcare Providers:  IncredibleEmployment.be  This test is no t yet approved or cleared by the [place] FDA and  has been authorized for detection and/or diagnosis of SARS-CoV-2 by FDA under an Emergency Use Authorization (EUA). This EUA will remain  in effect (meaning this test can be used) for the duration of the COVID-19 declaration under Section 564(b)(1) of the Act, 21 U.S.C.section 360bbb-3(b)(1), unless the authorization is terminated  or revoked sooner.       Influenza A by PCR NEGATIVE NEGATIVE Final   Influenza B by PCR NEGATIVE NEGATIVE Final    Comment: (NOTE) The Xpert Xpress SARS-CoV-2/FLU/RSV plus assay is intended as an aid in the diagnosis of influenza from Nasopharyngeal swab specimens and should not be  used as a sole basis for treatment. Nasal washings and aspirates are unacceptable for Xpert Xpress SARS-CoV-2/FLU/RSV testing.  Fact Sheet for Patients: EntrepreneurPulse.com.au  Fact Sheet for Healthcare Providers: IncredibleEmployment.be  This test is not yet approved or cleared by the [place] FDA and has been authorized for detection and/or diagnosis of SARS-CoV-2 by FDA under an Emergency Use Authorization (EUA). This EUA will remain in effect (meaning this test can be used) for the duration of the COVID-19 declaration under Section 564(b)(1) of the Act, 21 U.S.C. section 360bbb-3(b)(1), unless the authorization is terminated or revoked.  Performed at [facility], [place] [address] [place], [place] [address]     RADIOLOGY STUDIES/RESULTS: DG Chest Port 1 View  Result Date: 04/30/2021 CLINICAL DATA:  Trouble breathing EXAM: PORTABLE CHEST 1 VIEW COMPARISON:  02/18/2021 FINDINGS: The heart size and mediastinal contours are within normal limits. Both lungs are clear. The visualized skeletal structures are unremarkable. IMPRESSION: No active disease. Electronically Signed   By: Donavan Foil M.D.   On: 04/30/2021 20:13     LOS: 1 day   Oren Binet, MD  [facility]    To contact the attending provider between 7A-7P or the covering provider during after hours 7P-7A, please log into the web site [email or web address] and access using universal [place] password for that web site. If you do not have the password, please call the hospital operator.  05/01/2021, 11:25 AM

## 2021-05-01 NOTE — Assessment & Plan Note (Signed)
Resume statin 

## 2021-05-01 NOTE — Assessment & Plan Note (Addendum)
Denies daily alcohol use and denies going into withdrawal if he does not drink for several days.  Counseled to reduce his alcohol intake.  No signs of DTs we will continue to monitor closely.

## 2021-05-01 NOTE — Plan of Care (Signed)
°  Problem: Respiratory: Goal: Ability to maintain a clear airway will improve Outcome: Progressing  Advised to call RN for nebulizer as needed

## 2021-05-01 NOTE — Progress Notes (Signed)
°   05/01/21 1400  Vitals  BP (!) 118/94  MAP (mmHg) 99  BP Location Right Arm  BP Method Automatic  Patient Position (if appropriate) Lying  Pulse Rate 86  ECG Heart Rate 84  Resp 15  MEWS COLOR  MEWS Score Color Green  Oxygen Therapy  SpO2 96 %  O2 Device Nasal Cannula  O2 Flow Rate (L/min) 2 L/min  MEWS Score  MEWS Temp 0  MEWS Systolic 0  MEWS Pulse 0  MEWS RR 0  MEWS LOC 0  MEWS Score 0   Patient admitted from ED to room [id number]

## 2021-05-01 NOTE — Assessment & Plan Note (Addendum)
Blood pressure soft.  Hold Norvasc.

## 2021-05-02 MED ORDER — METHYLPREDNISOLONE SODIUM SUCC 40 MG IJ SOLR
40.0000 mg | INTRAMUSCULAR | Status: DC
  Administered 2021-05-03: 40 mg via INTRAVENOUS
  Filled 2021-05-02: qty 1

## 2021-05-02 MED ORDER — AZITHROMYCIN 500 MG PO TABS
250.0000 mg | ORAL_TABLET | Freq: Every day | ORAL | Status: DC
  Administered 2021-05-03: 250 mg via ORAL
  Filled 2021-05-02: qty 1

## 2021-05-02 MED ORDER — AZITHROMYCIN 500 MG PO TABS
500.0000 mg | ORAL_TABLET | Freq: Every day | ORAL | Status: AC
  Administered 2021-05-02: 500 mg via ORAL
  Filled 2021-05-02: qty 1

## 2021-05-02 NOTE — Progress Notes (Signed)
SATURATION QUALIFICATIONS: (This note is used to comply with regulatory documentation for home oxygen)  Patient Saturations on Room Air at Rest = 96%  Patient Saturations on Room Air while Ambulating = 95%  

## 2021-05-02 NOTE — Progress Notes (Signed)
PROGRESS NOTE        PATIENT DETAILS Name: Jason Wong Age: 66 y.o. Sex: male Date of Birth: 12-14-1955 Admit Date: 04/30/2021 Admitting Physician Orene Desanctis, DO RCB:[id number], Dalbert Batman, MD  Brief Summary: Patient is a 66 y.o.  male with history of COPD, ongoing tobacco use, HTN-who presented with shortness of breath-found to have severe respiratory distress requiring BiPAP due to COPD exacerbation.  See below for further details.  Significant Hospital events: 2/10>> admit for COPD-severe respiratory distress/hypoxia-required BiPAP. 2/11>> liberated off BiPAP.  Significant imaging studies: 2/10>> CXR: No PNA  Significant microbiology data: 2/10>> COVID/influenza PCR: Negative  Procedures: None  Consults:  None  Subjective:  Patient in bed, appears comfortable, denies any headache, no fever, no chest pain or pressure, much improved shortness of breath , no abdominal pain. No new focal weakness.   Objective: Vitals: Blood pressure 107/83, pulse 98, temperature 97.8 F (36.6 C), temperature source Oral, resp. rate 13, SpO2 93 %.   Exam:  Awake Alert, No new F.N deficits, Normal affect [place].AT,PERRAL Supple Neck, No JVD,   Symmetrical Chest wall movement, Good air movement bilaterally, mild bilateral wheezing RRR,No Gallops, Rubs or new Murmurs,  +ve B.Sounds, Abd Soft, No tenderness,   No Cyanosis, Clubbing or edema     Assessment/Plan: * Acute respiratory failure with hypoxia due to COPD exacerbation- (present on admission) Due to COPD exacerbation-initially required BiPAP, now improved and currently on 3 L nasal cannula oxygen with minimal wheezing.  Still not close to his baseline, uses no oxygen at home.  Continue steroids but start tapering will add atypical coverage with azithromycin.  Wrongly encouraged to sit up in chair use I-S and flutter valve for pulmonary toiletry and ambulate multiple times in the room.  Patient appears to be  noncompliant.  Also strictly counseled to quit smoking.  Current every day smoker- (present on admission) Counseled extensively.  ETOH abuse- (present on admission) Denies daily alcohol use and denies going into withdrawal if he does not drink for several days.  Counseled to reduce his alcohol intake.  No signs of DTs we will continue to monitor closely.  HLD (hyperlipidemia) Resume statin.  Essential hypertension- (present on admission) Blood pressure soft.  Hold Norvasc.  BMI: Estimated body mass index is 17.63 kg/m as calculated from the following:   Height as of 02/18/21: 5\' 9"  (1.753 m).   Weight as of 03/23/21: 54.2 kg.   Code status:   Code Status: Full Code   DVT Prophylaxis: enoxaparin (LOVENOX) injection 40 mg Start: 05/01/21 1000   Family Communication: None at bedside  Disposition Plan: Status is: Inpatient  Planned Discharge Destination:Home likely in the next 1-2 days depending on clinical improvement.   Diet: Diet Order             Diet Heart Room service appropriate? Yes; Fluid consistency: Thin  Diet effective now                     Antimicrobial agents: Anti-infectives (From admission, onward)    None        MEDICATIONS: Scheduled Meds:  arformoterol  15 mcg Nebulization BID   atorvastatin  10 mg Oral Daily   budesonide (PULMICORT) nebulizer solution  0.25 mg Nebulization BID   enoxaparin (LOVENOX) injection  40 mg Subcutaneous Daily   feeding supplement  237  mL Oral BID BM   guaiFENesin  600 mg Oral BID   ipratropium-albuterol  3 mL Nebulization TID   [START ON 05/03/2021] methylPREDNISolone (SOLU-MEDROL) injection  40 mg Intravenous Q24H   Continuous Infusions: PRN Meds:.acetaminophen, albuterol, ondansetron (ZOFRAN) IV   I have personally reviewed following labs and imaging studies  LABORATORY DATA:  Recent Labs  Lab 04/30/21 1839 04/30/21 1911 04/30/21 2219 05/01/21 0500  WBC 10.4  --   --  4.9  HGB 14.9 15.6 13.6  13.3  HCT 45.1 46.0 40.0 39.8  PLT 282  --   --  264  MCV 101.1*  --   --  100.3*  MCH 33.4  --   --  33.5  MCHC 33.0  --   --  33.4  RDW 13.6  --   --  13.7  LYMPHSABS 5.6*  --   --   --   MONOABS 0.8  --   --   --   EOSABS 0.9*  --   --   --   BASOSABS 0.1  --   --   --     Recent Labs  Lab 04/30/21 1839 04/30/21 1840 04/30/21 1911 04/30/21 2219 05/01/21 0500  NA 140  --  141 143 141  K 3.8  --  3.7 3.5 4.5  CL 106  --   --   --  104  CO2 23  --   --   --  24  GLUCOSE 205*  --   --   --  190*  BUN 15  --   --   --  17  CREATININE 0.95  --   --   --  1.17  CALCIUM 9.5  --   --   --  9.8  BNP  --  65.9  --   --   --       RADIOLOGY STUDIES/RESULTS: DG Chest Port 1 View  Result Date: 04/30/2021 CLINICAL DATA:  Trouble breathing EXAM: PORTABLE CHEST 1 VIEW COMPARISON:  02/18/2021 FINDINGS: The heart size and mediastinal contours are within normal limits. Both lungs are clear. The visualized skeletal structures are unremarkable. IMPRESSION: No active disease. Electronically Signed   By: Donavan Foil M.D.   On: 04/30/2021 20:13     LOS: 2 days   Signature  Lala Lund M.D on 05/02/2021 at 10:42 AM   -  To page go to [email or web address]

## 2021-05-02 NOTE — Plan of Care (Signed)

## 2021-05-03 ENCOUNTER — Other Ambulatory Visit (HOSPITAL_COMMUNITY): Payer: Self-pay

## 2021-05-03 LAB — COMPREHENSIVE METABOLIC PANEL
ALT: 18 U/L (ref 0–44)
AST: 20 U/L (ref 15–41)
Albumin: 3.9 g/dL (ref 3.5–5.0)
Alkaline Phosphatase: 64 U/L (ref 38–126)
Anion gap: 11 (ref 5–15)
BUN: 28 mg/dL — ABNORMAL HIGH (ref 8–23)
CO2: 24 mmol/L (ref 22–32)
Calcium: 9.7 mg/dL (ref 8.9–10.3)
Chloride: 103 mmol/L (ref 98–111)
Creatinine, Ser: 1.09 mg/dL (ref 0.61–1.24)
GFR, Estimated: >60 mL/min (ref >60)
Glucose, Bld: 108 mg/dL — ABNORMAL HIGH (ref 70–99)
Potassium: 4 mmol/L (ref 3.5–5.1)
Sodium: 138 mmol/L (ref 135–145)
Total Bilirubin: 0.6 mg/dL (ref 0.3–1.2)
Total Protein: 6.8 g/dL (ref 6.5–8.1)

## 2021-05-03 LAB — CBC WITH DIFFERENTIAL/PLATELET
Abs Immature Granulocytes: 0.03 10*3/uL (ref 0.00–0.07)
Basophils Absolute: 0 10*3/uL (ref 0.0–0.1)
Basophils Relative: 0 %
Eosinophils Absolute: 0 10*3/uL (ref 0.0–0.5)
Eosinophils Relative: 0 %
HCT: 37.7 % — ABNORMAL LOW (ref 39.0–52.0)
Hemoglobin: 12.9 g/dL — ABNORMAL LOW (ref 13.0–17.0)
Immature Granulocytes: 0 %
Lymphocytes Relative: 25 %
Lymphs Abs: 2.5 10*3/uL (ref 0.7–4.0)
MCH: 34.2 pg — ABNORMAL HIGH (ref 26.0–34.0)
MCHC: 34.2 g/dL (ref 30.0–36.0)
MCV: 100 fL (ref 80.0–100.0)
Monocytes Absolute: 1 10*3/uL (ref 0.1–1.0)
Monocytes Relative: 10 %
Neutro Abs: 6.6 10*3/uL (ref 1.7–7.7)
Neutrophils Relative %: 65 %
Platelets: 259 10*3/uL (ref 150–400)
RBC: 3.77 MIL/uL — ABNORMAL LOW (ref 4.22–5.81)
RDW: 14.1 % (ref 11.5–15.5)
WBC: 10.2 10*3/uL (ref 4.0–10.5)
nRBC: 0 % (ref 0.0–0.2)

## 2021-05-03 LAB — BRAIN NATRIURETIC PEPTIDE: B Natriuretic Peptide: 82.9 pg/mL (ref 0.0–100.0)

## 2021-05-03 LAB — MAGNESIUM: Magnesium: 1.9 mg/dL (ref 1.7–2.4)

## 2021-05-03 MED ORDER — PREDNISONE 5 MG PO TABS
ORAL_TABLET | ORAL | 0 refills | Status: DC
Start: 2021-05-03 — End: 2021-05-17
  Filled 2021-05-03: qty 65, 18d supply, fill #0

## 2021-05-03 MED ORDER — SPIRIVA HANDIHALER 18 MCG IN CAPS
18.0000 ug | ORAL_CAPSULE | Freq: Every day | RESPIRATORY_TRACT | 0 refills | Status: DC
Start: 2021-05-03 — End: 2021-05-17
  Filled 2021-05-03: qty 30, 30d supply, fill #0

## 2021-05-03 MED ORDER — NICOTINE 7 MG/24HR TD PT24
7.0000 mg | MEDICATED_PATCH | Freq: Every day | TRANSDERMAL | 0 refills | Status: DC
  Filled 2021-05-03: qty 28, 28d supply, fill #0

## 2021-05-03 MED ORDER — IPRATROPIUM-ALBUTEROL 0.5-2.5 (3) MG/3ML IN SOLN
3.0000 mL | Freq: Two times a day (BID) | RESPIRATORY_TRACT | 0 refills | Status: DC
  Filled 2021-05-03: qty 180, 30d supply, fill #0

## 2021-05-03 MED ORDER — FLUTICASONE FUROATE-VILANTEROL 100-25 MCG/ACT IN AEPB
1.0000 | INHALATION_SPRAY | Freq: Every day | RESPIRATORY_TRACT | 0 refills | Status: DC
  Filled 2021-05-03: qty 60, 30d supply, fill #0

## 2021-05-03 MED ORDER — ALBUTEROL SULFATE HFA 108 (90 BASE) MCG/ACT IN AERS
2.0000 | INHALATION_SPRAY | RESPIRATORY_TRACT | 5 refills | Status: DC | PRN
  Filled 2021-05-03: qty 8.5, 8d supply, fill #0

## 2021-05-03 MED ORDER — AZITHROMYCIN 250 MG PO TABS
250.0000 mg | ORAL_TABLET | Freq: Every day | ORAL | 0 refills | Status: DC
  Filled 2021-05-03: qty 3, 3d supply, fill #0

## 2021-05-03 MED ORDER — IPRATROPIUM-ALBUTEROL 0.5-2.5 (3) MG/3ML IN SOLN: 3.0000 mL | Freq: Two times a day (BID) | RESPIRATORY_TRACT | Status: DC

## 2021-05-03 NOTE — Care Management Important Message (Signed)
Important Message  Patient Details  Name: Jason Wong MRN: [id number] Date of Birth: July 17, 1955   Medicare Important Message Given:  Yes     Mackinsey Pelland Montine Circle 05/03/2021, 3:51 PM

## 2021-05-03 NOTE — Discharge Summary (Signed)
Jason Wong EVO:[id number] DOB: Feb 19, 1956 DOA: 04/30/2021  PCP: Jason Pier, MD  Admit date: 04/30/2021  Discharge date: 05/03/2021  Admitted From: Home   Disposition:  Home   Recommendations for Outpatient Follow-up:   Follow up with PCP in 1-2 weeks  PCP Please obtain BMP/CBC, 2 view CXR in 1week,  (see Discharge instructions)   PCP Please follow up on the following pending results:    Home Health: PT if he qualifies   Equipment/Devices: Neb machine  Consultations: None  Discharge Condition: Stable    CODE STATUS: Full    Diet Recommendation: Heart Healthy   Diet Order             Diet - low sodium heart healthy           Diet Heart Room service appropriate? Yes; Fluid consistency: Thin  Diet effective now                    Chief Complaint  Patient presents with   Respiratory Distress     Brief history of present illness from the day of admission and additional interim summary    Patient is a 67 y.o.  male with history of COPD, ongoing tobacco use, HTN-who presented with shortness of breath-found to have severe respiratory distress requiring BiPAP due to COPD exacerbation.  See below for further details.   Significant Hospital events: 2/10>> admit for COPD-severe respiratory distress/hypoxia-required BiPAP. 2/11>> liberated off BiPAP.   Significant imaging studies: 2/10>> CXR: No PNA   Significant microbiology data: 2/10>> COVID/influenza PCR: Negative                                                                 Hospital Course    Acute respiratory failure with hypoxia due to COPD exacerbation- (present on admission) Due to COPD exacerbation-initially required BiPAP, he was treated with IV steroids, nebulizer treatments and azithromycin, he was negative for COVID-19 and  influenza, chest x-ray was nonacute.  He is now improved and currently on 1 L nasal cannula oxygen with no wheezing or SOB.  He is now ready close to his baseline, ambulated him on room air and he did fine with pulse ox staying above 94% on room air upon exertion, he has been strictly counseled to quit smoking, will be placed on oral steroid taper, few more days of oral azithromycin, will be provided nebulizer treatments along with outpatient PCP follow-up and pulmonary follow-up postdischarge.    Current every day smoker- (present on admission) Counseled extensively.   ETOH abuse- (present on admission) Denies daily alcohol use and denies going into withdrawal if he does not drink for several days.  Counseled to reduce his alcohol intake.  No signs of DTs we will continue to monitor closely.   HLD (hyperlipidemia)  Resume statin.   Essential hypertension- (present on admission) Blood pressure soft.  Hold Norvasc.   Discharge diagnosis     Principal Problem:   Acute respiratory failure with hypoxia due to COPD exacerbation Active Problems:   Current every day smoker   ETOH abuse   Essential hypertension   COPD exacerbation (HCC)   HLD (hyperlipidemia)    Discharge instructions    Discharge Instructions     Diet - low sodium heart healthy   Complete by: As directed    Discharge instructions   Complete by: As directed    Follow with Primary MD Jason Pier, MD in 7 days   Get CBC, CMP, 2 view Chest X ray -  checked next visit within 1 week by Primary MD    Activity: As tolerated with Full fall precautions use walker/cane & assistance as needed  Disposition Home    Diet: Heart Healthy    Special Instructions: If you have smoked or chewed Tobacco  in the last 2 yrs please stop smoking, stop any regular Alcohol  and or any Recreational drug use.  On your next visit with your primary care physician please Get Medicines reviewed and adjusted.  Please request your  Prim.MD to go over all Hospital Tests and Procedure/Radiological results at the follow up, please get all Hospital records sent to your Prim MD by signing hospital release before you go home.  If you experience worsening of your admission symptoms, develop shortness of breath, life threatening emergency, suicidal or homicidal thoughts you must seek medical attention immediately by calling 911 or calling your MD immediately  if symptoms less severe.  You Must read complete instructions/literature along with all the possible adverse reactions/side effects for all the Medicines you take and that have been prescribed to you. Take any new Medicines after you have completely understood and accpet all the possible adverse reactions/side effects.   For home use only DME Nebulizer machine   Complete by: As directed    Patient needs a nebulizer to treat with the following condition: COPD (chronic obstructive pulmonary disease) ([place])   Length of Need: 6 Months   Increase activity slowly   Complete by: As directed        Discharge Medications   Allergies as of 05/03/2021       Reactions   Tobacco Shortness Of Breath, Other (See Comments)   CIGARETTE SMOKE TRIGGERS THE PATIENT'S RESPIRATORY ISSUES!!        Medication List     STOP taking these medications    Breo Ellipta 200-25 MCG/ACT Aepb Generic drug: fluticasone furoate-vilanterol Replaced by: fluticasone furoate-vilanterol 100-25 MCG/ACT Aepb       TAKE these medications    albuterol 108 (90 Base) MCG/ACT inhaler Commonly known as: VENTOLIN HFA Inhale 2 puffs into the lungs every 4 (four) hours as needed for wheezing or shortness of breath. What changed:  when to take this Another medication with the same name was removed. Continue taking this medication, and follow the directions you see here.   amLODipine 5 MG tablet Commonly known as: NORVASC Take 1 tablet (5 mg total) by mouth daily.   atorvastatin 10 MG tablet Commonly  known as: LIPITOR Take 1 tablet (10 mg total) by mouth daily.   azithromycin 250 MG tablet Commonly known as: ZITHROMAX Take 1 tablet (250 mg total) by mouth daily.   fluticasone furoate-vilanterol 100-25 MCG/ACT Aepb Commonly known as: Breo Ellipta Inhale 1 puff into the lungs daily. Replaces: Memory Dance  Ellipta 200-25 MCG/ACT Aepb   ipratropium-albuterol 0.5-2.5 (3) MG/3ML Soln Commonly known as: DUONEB Take 3 mLs by nebulization in the morning and at bedtime.   multivitamin with minerals Tabs tablet Take 1 tablet by mouth daily.   naloxone 2 MG/2ML injection Commonly known as: NARCAN Inject if unconscious or not breathing   nicotine 7 mg/24hr patch Commonly known as: NICODERM CQ - dosed in mg/24 hr Place 1 patch (7 mg total) onto the skin daily.   predniSONE 5 MG tablet Commonly known as: DELTASONE Label  & dispense according to the schedule below. take 8 Pills PO for 3 days, 6 Pills PO for 3 days, 4 Pills PO for 3 days, 2 Pills PO for 3 days, 1 Pills PO for 3 days, 1/2 Pill  PO for 3 days then STOP. Total 65 pills.   Spiriva HandiHaler 18 MCG inhalation capsule Generic drug: tiotropium Place 1 capsule (18 mcg total) into inhaler and inhale daily.               Durable Medical Equipment  (From admission, onward)           Start     Ordered   05/03/21 0000  For home use only DME Nebulizer machine       Question Answer Comment  Patient needs a nebulizer to treat with the following condition COPD (chronic obstructive pulmonary disease) ([place])   Length of Need 6 Months      05/03/21 0936             Follow-up Information     Jason Pier, MD. Schedule an appointment as soon as possible for a visit in 1 week(s).   Specialty: Internal Medicine Contact information: [place] [address] [phone]         Spero Geralds, MD. Schedule an appointment as soon as possible for a visit in 1 week(s).   Specialty: Pulmonary  Disease Why: COPD Contact information: [address] [place] [address] [phone]                 Major procedures and Radiology Reports - PLEASE review detailed and final reports thoroughly  -       DG Chest Port 1 View  Result Date: 04/30/2021 CLINICAL DATA:  Trouble breathing EXAM: PORTABLE CHEST 1 VIEW COMPARISON:  02/18/2021 FINDINGS: The heart size and mediastinal contours are within normal limits. Both lungs are clear. The visualized skeletal structures are unremarkable. IMPRESSION: No active disease. Electronically Signed   By: Donavan Foil M.D.   On: 04/30/2021 20:13     Today   Subjective    Siris Hoos today has no headache,no chest abdominal pain,no new weakness tingling or numbness, feels much better wants to go home today.     Objective   Blood pressure 112/84, pulse 66, temperature 97.6 F (36.4 C), temperature source Axillary, resp. rate 16, SpO2 99 %.   Intake/Output Summary (Last 24 hours) at 05/03/2021 0938 Last data filed at 05/03/2021 0402 Gross per 24 hour  Intake --  Output 675 ml  Net -675 ml    Exam  Awake Alert, No new F.N deficits, Normal affect IX[place].AT,PERRAL Supple Neck, No JVD,   Symmetrical Chest wall movement, Good air movement bilaterally, CTAB, no wheezing today RRR,No Gallops, Rubs or new Murmurs,  +ve B.Sounds, Abd Soft, No tenderness,   No Cyanosis, Clubbing or edema      Data Review   CBC w Diff:  Lab Results  Component Value Date   WBC 10.2 05/03/2021   HGB 12.9 (L) 05/03/2021   HGB 17.2 09/06/2019   HCT 37.7 (L) 05/03/2021   HCT 49.9 09/06/2019   PLT 259 05/03/2021   PLT 274 09/06/2019   LYMPHOPCT 25 05/03/2021   MONOPCT 10 05/03/2021   EOSPCT 0 05/03/2021   BASOPCT 0 05/03/2021    CMP:  Lab Results  Component Value Date   NA 138 05/03/2021   NA 144 01/07/2020   K 4.0 05/03/2021   CL 103 05/03/2021   CO2 24 05/03/2021   BUN 28 (H) 05/03/2021   BUN 14 01/07/2020   CREATININE 1.09  05/03/2021   CREATININE 1.18 10/03/2017   PROT 6.8 05/03/2021   PROT 8.2 09/06/2019   ALBUMIN 3.9 05/03/2021   ALBUMIN 4.9 (H) 09/06/2019   BILITOT 0.6 05/03/2021   BILITOT 0.4 09/06/2019   ALKPHOS 64 05/03/2021   AST 20 05/03/2021   ALT 18 05/03/2021  .   Total Time in preparing paper work, data evaluation and todays exam - 24 minutes  Lala Lund M.D on 05/03/2021 at 9:38 AM  [facility]

## 2021-05-03 NOTE — Evaluation (Signed)
Physical Therapy Evaluation and Discharge Patient Details Name: Jason Wong MRN: [id number] DOB: Nov 09, 1955 Today's Date: 05/03/2021  History of Present Illness  Pt is a 66 y/o male admitted secondary to worsening SOB. Thought to be secondary to COPD exacerbation. PMH includes HTN, tobacco use and COPD.  Clinical Impression  Patient evaluated by Physical Therapy with no further acute PT needs identified. All education has been completed and the patient has no further questions. Pt able to perform mobility tasks at an independent level. No overt LOB noted. Pt with mild SOB, but oxygen sats >90% on RA. Educated about activity pacing at home. See below for any follow-up Physical Therapy or equipment needs. PT is signing off. Thank you for this referral. If needs change, please re-consult.         Recommendations for follow up therapy are one component of a multi-disciplinary discharge planning process, led by the attending physician.  Recommendations may be updated based on patient status, additional functional criteria and insurance authorization.  Follow Up Recommendations No PT follow up    Assistance Recommended at Discharge None  Patient can return home with the following       Equipment Recommendations None recommended by PT  Recommendations for Other Services       Functional Status Assessment Patient has had a recent decline in their functional status and demonstrates the ability to make significant improvements in function in a reasonable and predictable amount of time.     Precautions / Restrictions Precautions Precautions: None Restrictions Weight Bearing Restrictions: No      Mobility  Bed Mobility Overal bed mobility: Independent                  Transfers Overall transfer level: Independent                      Ambulation/Gait Ambulation/Gait assistance: Independent Gait Distance (Feet): 600 Feet Assistive device: None Gait  Pattern/deviations: Step-through pattern Gait velocity: Decreased     General Gait Details: Slower gait speed, but no LOB noted. Mild SOB reported, but oxygen sats WFL on RA.  Stairs            Wheelchair Mobility    Modified Rankin (Stroke Patients Only)       Balance Overall balance assessment: No apparent balance deficits (not formally assessed)                                           Pertinent Vitals/Pain Pain Assessment Pain Assessment: No/denies pain    Home Living Family/patient expects to be discharged to:: Private residence Living Arrangements: Other relatives (sister) Available Help at Discharge: Family;Available PRN/intermittently Type of Home: House Home Access: Stairs to enter Entrance Stairs-Rails: Right Entrance Stairs-Number of Steps: 3 Alternate Level Stairs-Number of Steps: flight Home Layout: Two level Home Equipment: None      Prior Function Prior Level of Function : Independent/Modified Independent                     Hand Dominance        Extremity/Trunk Assessment   Upper Extremity Assessment Upper Extremity Assessment: Overall WFL for tasks assessed    Lower Extremity Assessment Lower Extremity Assessment: Overall WFL for tasks assessed    Cervical / Trunk Assessment Cervical / Trunk Assessment: Normal  Communication   Communication: No difficulties  Cognition Arousal/Alertness: Awake/alert Behavior During Therapy: WFL for tasks assessed/performed Overall Cognitive Status: Within Functional Limits for tasks assessed                                          General Comments      Exercises     Assessment/Plan    PT Assessment Patient does not need any further PT services  PT Problem List         PT Treatment Interventions      PT Goals (Current goals can be found in the Care Plan section)  Acute Rehab PT Goals Patient Stated Goal: to go home PT Goal Formulation:  With patient Time For Goal Achievement: 05/03/21 Potential to Achieve Goals: Good    Frequency       Co-evaluation               AM-PAC PT "6 Clicks" Mobility  Outcome Measure Help needed turning from your back to your side while in a flat bed without using bedrails?: None Help needed moving from lying on your back to sitting on the side of a flat bed without using bedrails?: None Help needed moving to and from a bed to a chair (including a wheelchair)?: None Help needed standing up from a chair using your arms (e.g., wheelchair or bedside chair)?: None Help needed to walk in hospital room?: None Help needed climbing 3-5 steps with a railing? : None 6 Click Score: 24    End of Session Equipment Utilized During Treatment: Gait belt Activity Tolerance: Patient tolerated treatment well Patient left: in bed;with call bell/phone within reach Nurse Communication: Mobility status PT Visit Diagnosis: Other abnormalities of gait and mobility (R26.89)    Time: 5027-7412 PT Time Calculation (min) (ACUTE ONLY): 16 min   Charges:   PT Evaluation $PT Eval Low Complexity: 1 Low          Lou Miner, DPT  Acute Rehabilitation Services  Pager: [phone] Office: [phone]   Rudean Hitt 05/03/2021, 12:23 PM

## 2021-05-03 NOTE — Discharge Instructions (Signed)
Follow with Primary MD Ladell Pier, MD in 7 days   Get CBC, CMP, 2 view Chest X ray -  checked next visit within 1 week by Primary MD    Activity: As tolerated with Full fall precautions use walker/cane & assistance as needed  Disposition Home    Diet: Heart Healthy    Special Instructions: If you have smoked or chewed Tobacco  in the last 2 yrs please stop smoking, stop any regular Alcohol  and or any Recreational drug use.  On your next visit with your primary care physician please Get Medicines reviewed and adjusted.  Please request your Prim.MD to go over all Hospital Tests and Procedure/Radiological results at the follow up, please get all Hospital records sent to your Prim MD by signing hospital release before you go home.  If you experience worsening of your admission symptoms, develop shortness of breath, life threatening emergency, suicidal or homicidal thoughts you must seek medical attention immediately by calling 911 or calling your MD immediately  if symptoms less severe.  You Must read complete instructions/literature along with all the possible adverse reactions/side effects for all the Medicines you take and that have been prescribed to you. Take any new Medicines after you have completely understood and accpet all the possible adverse reactions/side effects.

## 2021-05-03 NOTE — TOC Transition Note (Signed)
Transition of Care [facility]) - CM/SW Discharge Note   Patient Details  Name: Jason Wong MRN: [id number] Date of Birth: 1955-03-26  [facility] CM/SW Contact:  Pollie Friar, RN Phone Number: 05/03/2021, 11:43 AM   Clinical Narrative:    Patient discharging home with self care. CM consulted for assistance with his inhalers. D/c meds sent to [facility] and pt able to cover the cost.  Pt didn't qualify for home oxygen per RN note yesterday.  Pt has transportation home.    Final next level of care: Home/Self Care Barriers to Discharge: No Barriers Identified   Patient Goals and CMS Choice        Discharge Placement                       Discharge Plan and Services   Discharge Planning Services: CM Consult                                 Social Determinants of Health (SDOH) Interventions     Readmission Risk Interventions No flowsheet data found.

## 2021-05-03 NOTE — Progress Notes (Signed)
SATURATION QUALIFICATIONS: (This note is used to comply with regulatory documentation for home oxygen)  Patient Saturations on Room Air at Rest = 95%  Patient Saturations on Room Air while Ambulating = 93%  Patient Saturations on 1 Liters of oxygen while Ambulating = 97%

## 2021-05-03 NOTE — Plan of Care (Signed)
°  Problem: Education: Goal: Knowledge of disease or condition will improve Outcome: Progressing Goal: Knowledge of the prescribed therapeutic regimen will improve Outcome: Progressing Goal: Individualized Educational Video(s) Outcome: Progressing   Problem: Education: Goal: Knowledge of disease or condition will improve Outcome: Progressing Goal: Knowledge of the prescribed therapeutic regimen will improve Outcome: Progressing Goal: Individualized Educational Video(s) Outcome: Progressing   Problem: Education: Goal: Knowledge of disease or condition will improve Outcome: Progressing Goal: Knowledge of the prescribed therapeutic regimen will improve Outcome: Progressing Goal: Individualized Educational Video(s) Outcome: Progressing   Problem: Education: Goal: Knowledge of disease or condition will improve Outcome: Progressing Goal: Knowledge of the prescribed therapeutic regimen will improve Outcome: Progressing Goal: Individualized Educational Video(s) Outcome: Progressing   Problem: Activity: Goal: Ability to tolerate increased activity will improve Outcome: Progressing Goal: Will verbalize the importance of balancing activity with adequate rest periods Outcome: Progressing   Problem: Respiratory: Goal: Ability to maintain a clear airway will improve Outcome: Progressing Goal: Levels of oxygenation will improve Outcome: Progressing Goal: Ability to maintain adequate ventilation will improve Outcome: Progressing   Problem: Activity: Goal: Risk for activity intolerance will decrease Outcome: Progressing   Problem: Nutrition: Goal: Adequate nutrition will be maintained Outcome: Progressing   Problem: Coping: Goal: Level of anxiety will decrease Outcome: Progressing   Problem: Elimination: Goal: Will not experience complications related to bowel motility Outcome: Progressing Goal: Will not experience complications related to urinary retention Outcome:  Progressing   Problem: Safety: Goal: Ability to remain free from injury will improve Outcome: Progressing   Problem: Skin Integrity: Goal: Risk for impaired skin integrity will decrease Outcome: Progressing

## 2021-05-04 ENCOUNTER — Telehealth: Payer: Self-pay

## 2021-05-04 NOTE — Telephone Encounter (Signed)
Transition Care Management Follow-up Telephone Call Date of discharge and from where: 05/03/2021, [facility]  How have you been since you were released from the hospital? He stated that he is feeling a little better Any questions or concerns? No  Items Reviewed: Did the pt receive and understand the discharge instructions provided? Yes  Medications obtained and verified? Yes  - he said he has all of his medications.  Reminded him to review the list and call this clinic if he has any questions and he said he understood Other? No  Any new allergies since your discharge? No  Dietary orders reviewed? Yes Do you have support at home? Yes ,his sister and nephew  [place] and Equipment/Supplies: Were home health services ordered? no If so, what is the name of the agency? N/a  Has the agency set up a time to come to the patient's home? not applicable Were any new equipment or medical supplies ordered?  Yes: nebulizer What is the name of the medical supply agency? He received it in the hospital Were you able to get the supplies/equipment? not applicable Do you have any questions related to the use of the equipment or supplies? No  Functional Questionnaire: (I = Independent and D = Dependent) ADLs: independent   Follow up appointments reviewed:  PCP Hospital f/u appt confirmed? Yes  Scheduled to see Dr Wynetta Emery  - 05/17/2021 @ [facility] f/u appt confirmed?  None scheduled at this time.    Are transportation arrangements needed? No  If their condition worsens, is the pt aware to call PCP or go to the Emergency Dept.? Yes Was the patient provided with contact information for the PCP's office or ED? Yes Was to pt encouraged to call back with questions or concerns? Yes

## 2021-05-07 ENCOUNTER — Emergency Department (HOSPITAL_COMMUNITY): Payer: Medicare Other

## 2021-05-07 ENCOUNTER — Emergency Department (HOSPITAL_COMMUNITY)
Admission: EM | Admit: 2021-05-07 | Discharge: 2021-05-07 | Disposition: A | Discharge status: Home / Self Care | Payer: Medicare Other | Attending: Emergency Medicine | Admitting: Emergency Medicine

## 2021-05-07 ENCOUNTER — Other Ambulatory Visit: Payer: Self-pay

## 2021-05-07 ENCOUNTER — Encounter (HOSPITAL_COMMUNITY): Payer: Self-pay | Admitting: Emergency Medicine

## 2021-05-07 ENCOUNTER — Emergency Department (HOSPITAL_COMMUNITY)
Admission: EM | Admit: 2021-05-07 | Discharge: 2021-05-07 | Discharge status: Absent without leave | Payer: Medicare Other | Source: Home / Self Care

## 2021-05-07 DIAGNOSIS — Z79899 Other long term (current) drug therapy: Secondary | ICD-10-CM | POA: Insufficient documentation

## 2021-05-07 DIAGNOSIS — J9801 Acute bronchospasm: Secondary | ICD-10-CM | POA: Insufficient documentation

## 2021-05-07 DIAGNOSIS — Z7952 Long term (current) use of systemic steroids: Secondary | ICD-10-CM | POA: Diagnosis not present

## 2021-05-07 DIAGNOSIS — Z20822 Contact with and (suspected) exposure to covid-19: Secondary | ICD-10-CM | POA: Insufficient documentation

## 2021-05-07 DIAGNOSIS — F17200 Nicotine dependence, unspecified, uncomplicated: Secondary | ICD-10-CM | POA: Insufficient documentation

## 2021-05-07 DIAGNOSIS — R0602 Shortness of breath: Secondary | ICD-10-CM | POA: Diagnosis present

## 2021-05-07 DIAGNOSIS — J449 Chronic obstructive pulmonary disease, unspecified: Secondary | ICD-10-CM | POA: Insufficient documentation

## 2021-05-07 LAB — CBC WITH DIFFERENTIAL/PLATELET
Abs Immature Granulocytes: 0.02 10*3/uL (ref 0.00–0.07)
Basophils Absolute: 0 10*3/uL (ref 0.0–0.1)
Basophils Relative: 0 %
Eosinophils Absolute: 0.1 10*3/uL (ref 0.0–0.5)
Eosinophils Relative: 1 %
HCT: 39.1 % (ref 39.0–52.0)
Hemoglobin: 13.3 g/dL (ref 13.0–17.0)
Immature Granulocytes: 0 %
Lymphocytes Relative: 16 %
Lymphs Abs: 1.2 10*3/uL (ref 0.7–4.0)
MCH: 34.7 pg — ABNORMAL HIGH (ref 26.0–34.0)
MCHC: 34 g/dL (ref 30.0–36.0)
MCV: 102.1 fL — ABNORMAL HIGH (ref 80.0–100.0)
Monocytes Absolute: 0.4 10*3/uL (ref 0.1–1.0)
Monocytes Relative: 5 %
Neutro Abs: 5.6 10*3/uL (ref 1.7–7.7)
Neutrophils Relative %: 78 %
Platelets: 329 10*3/uL (ref 150–400)
RBC: 3.83 MIL/uL — ABNORMAL LOW (ref 4.22–5.81)
RDW: 14.1 % (ref 11.5–15.5)
WBC: 7.3 10*3/uL (ref 4.0–10.5)
nRBC: 0 % (ref 0.0–0.2)

## 2021-05-07 LAB — BASIC METABOLIC PANEL
Anion gap: 9 (ref 5–15)
BUN: 19 mg/dL (ref 8–23)
CO2: 27 mmol/L (ref 22–32)
Calcium: 9.5 mg/dL (ref 8.9–10.3)
Chloride: 105 mmol/L (ref 98–111)
Creatinine, Ser: 1.04 mg/dL (ref 0.61–1.24)
GFR, Estimated: >60 mL/min (ref >60)
Glucose, Bld: 104 mg/dL — ABNORMAL HIGH (ref 70–99)
Potassium: 4.3 mmol/L (ref 3.5–5.1)
Sodium: 141 mmol/L (ref 135–145)

## 2021-05-07 LAB — BRAIN NATRIURETIC PEPTIDE: B Natriuretic Peptide: 77.8 pg/mL (ref 0.0–100.0)

## 2021-05-07 LAB — TROPONIN I (HIGH SENSITIVITY)
Troponin I (High Sensitivity): 15 ng/L (ref <18)
Troponin I (High Sensitivity): 9 ng/L (ref <18)

## 2021-05-07 LAB — RESP PANEL BY RT-PCR (FLU A&B, COVID) ARPGX2
Influenza A by PCR: NEGATIVE
Influenza B by PCR: NEGATIVE
SARS Coronavirus 2 by RT PCR: NEGATIVE

## 2021-05-07 MED ORDER — IPRATROPIUM-ALBUTEROL 0.5-2.5 (3) MG/3ML IN SOLN
3.0000 mL | Freq: Once | RESPIRATORY_TRACT | Status: AC
  Administered 2021-05-07: 3 mL via RESPIRATORY_TRACT
  Filled 2021-05-07: qty 3

## 2021-05-07 MED ORDER — PREDNISONE 10 MG PO TABS
20.0000 mg | ORAL_TABLET | Freq: Every day | ORAL | 0 refills | Status: DC
  Filled 2021-05-07: qty 10, 5d supply, fill #0

## 2021-05-07 MED ORDER — PREDNISONE 20 MG PO TABS
40.0000 mg | ORAL_TABLET | Freq: Once | ORAL | Status: AC
  Administered 2021-05-07: 40 mg via ORAL
  Filled 2021-05-07: qty 2

## 2021-05-07 NOTE — ED Provider Notes (Signed)
[facility] EMERGENCY DEPARTMENT Provider Note   CSN: [id number] Arrival date & time: 05/07/21  3875     History  No chief complaint on file.   Jason Wong is a 66 y.o. male.  66 year old male with prior medical history as detailed below presents from home for evaluation.  Patient reports increased shortness of breath and coughing.  Patient reports that he was released from the hospital 4 days prior after admission for COPD exacerbation.  Patient reports that he was doing well for the last several days until this morning  He denies fever.  He denies chest pain.  He reports using a breathing treatment at home without significant improvement so he decided to come to the ED for evaluation.  The history is provided by the patient and medical records.  Shortness of Breath Severity:  Mild Onset quality:  Sudden Duration:  2 hours Timing:  Constant Progression:  Worsening Chronicity:  New Relieved by:  Nothing Worsened by:  Nothing     Home Medications Prior to Admission medications   Medication Sig Start Date End Date Taking? Authorizing Provider  albuterol (VENTOLIN HFA) 108 (90 Base) MCG/ACT inhaler Inhale 2 puffs into the lungs every 4 (four) hours as needed for wheezing or shortness of breath. 05/03/21   Thurnell Lose, MD  amLODipine (NORVASC) 5 MG tablet Take 1 tablet (5 mg total) by mouth daily. 03/23/21   Ladell Pier, MD  atorvastatin (LIPITOR) 10 MG tablet Take 1 tablet (10 mg total) by mouth daily. 03/23/21   Ladell Pier, MD  azithromycin (ZITHROMAX) 250 MG tablet Take 1 tablet (250 mg total) by mouth daily. 05/03/21   Thurnell Lose, MD  fluticasone furoate-vilanterol (BREO ELLIPTA) 100-25 MCG/ACT AEPB Inhale 1 puff into the lungs daily. 05/03/21   Thurnell Lose, MD  ipratropium-albuterol (DUONEB) 0.5-2.5 (3) MG/3ML SOLN Use 1 vial by nebulization in the morning and at bedtime. 05/03/21   Thurnell Lose, MD  Multiple Vitamin  (MULTIVITAMIN WITH MINERALS) TABS tablet Take 1 tablet by mouth daily.    [provider]  naloxone Karma Greaser) 2 MG/2ML injection Inject if unconscious or not breathing 08/21/20   Pearson Grippe, DO  nicotine (NICODERM CQ - DOSED IN MG/24 HR) 7 mg/24hr patch Place 1 patch (7 mg total) onto the skin daily. 05/03/21   Thurnell Lose, MD  predniSONE (DELTASONE) 5 MG tablet Take 8 tabs by mouth daily for 3 days, then 6 tabs daily for 3 days, 4 tabs daily for 3 days, 2 tabs daily for 3 days, 1 tab daily for 3 days, then 1/2 tab daily for 3 days then STOP. 05/03/21   Thurnell Lose, MD  tiotropium (SPIRIVA HANDIHALER) 18 MCG inhalation capsule Place 1 capsule (18 mcg total) into inhaler and inhale daily. 05/03/21 05/03/22  Thurnell Lose, MD      Allergies    Tobacco    Review of Systems   Review of Systems  Respiratory:  Positive for shortness of breath.   All other systems reviewed and are negative.  Physical Exam Updated Vital Signs BP 119/83    Pulse 70    Temp 98 F (36.7 C)    Resp 14    SpO2 97%  Physical Exam Vitals and nursing note reviewed.  Constitutional:      General: He is not in acute distress.    Appearance: Normal appearance. He is well-developed.  HENT:     Head: Normocephalic and atraumatic.  Eyes:     Conjunctiva/sclera: Conjunctivae normal.     Pupils: Pupils are equal, round, and reactive to light.  Cardiovascular:     Rate and Rhythm: Normal rate and regular rhythm.     Heart sounds: Normal heart sounds.  Pulmonary:     Effort: Pulmonary effort is normal. No respiratory distress.     Breath sounds: Normal breath sounds.  Abdominal:     General: There is no distension.     Palpations: Abdomen is soft.     Tenderness: There is no abdominal tenderness.  Musculoskeletal:        General: No deformity. Normal range of motion.     Cervical back: Normal range of motion and neck supple.  Skin:    General: Skin is warm and dry.  Neurological:      General: No focal deficit present.     Mental Status: He is alert and oriented to person, place, and time.    ED Results / Procedures / Treatments   Labs (all labs ordered are listed, but only abnormal results are displayed) Labs Reviewed  BASIC METABOLIC PANEL - Abnormal; Notable for the following components:      Result Value   Glucose, Bld 104 (*)    All other components within normal limits  CBC WITH DIFFERENTIAL/PLATELET - Abnormal; Notable for the following components:   RBC 3.83 (*)    MCV 102.1 (*)    MCH 34.7 (*)    All other components within normal limits  RESP PANEL BY RT-PCR (FLU A&B, COVID) ARPGX2  BRAIN NATRIURETIC PEPTIDE  TROPONIN I (HIGH SENSITIVITY)    EKG EKG Interpretation  Date/Time:  Friday May 07 2021 10:40:30 EST Ventricular Rate:  71 PR Interval:  135 QRS Duration: 91 QT Interval:  394 QTC Calculation: 429 R Axis:   87 Text Interpretation: Sinus rhythm Borderline right axis deviation Confirmed by Dene Gentry [phone]) on 05/07/2021 10:50:56 AM  Radiology DG Chest 2 View  Result Date: 05/07/2021 CLINICAL DATA:  Shortness of breath for 4 days. Smoker with history of COPD. EXAM: CHEST - 2 VIEW COMPARISON:  Radiographs 04/30/2021 and 02/18/2021.  CT 01/22/2020. FINDINGS: The heart size and mediastinal contours are stable. There is mild chronic central airway thickening, but no edema, confluent airspace opacity, pleural effusion or pneumothorax. An old rib fractures noted on the left. No acute osseous findings are seen. IMPRESSION: Stable chest with mild chronic central airway thickening. No radiographic evidence of acute cardiopulmonary process. Electronically Signed   By: Richardean Sale M.D.   On: 05/07/2021 10:19    Procedures Procedures    Medications Ordered in ED Medications - No data to display  ED Course/ Medical Decision Making/ A&P                           Medical Decision Making Risk Prescription drug management.    Medical  Screen Complete  This patient presented to the ED with complaint of dyspnea.  This complaint involves an extensive number of treatment options. The initial differential diagnosis includes, but is not limited to, copd exacerbation  This presentation is: Acute, Chronic, Self-Limited, Previously Undiagnosed, Uncertain Prognosis, Complicated, Systemic Symptoms, and Threat to Life/Bodily Function  Patient is presenting with complaint of mild shortness of breath.  Patient with recent admission for COPD exacerbation.  Patient reports that he was doing well for the first several days after discharge.  This morning he developed increasing shortness of breath  and wheezing.  Work-up in the ED is without evidence of significant acute pathology  Patient does feel improved after ED treatment.  Patient desires DC home.  Importance of close follow-up is stressed.    Co morbidities that complicated the patient's evaluation  COPD   Additional history obtained:  Additional history obtained from EMS External records from outside sources obtained and reviewed including prior ED visits and prior Inpatient records.    Lab Tests:  I ordered and personally interpreted labs.  The pertinent results include: CBC, BMP, troponin, COVID, flu, BNP   Imaging Studies ordered:  I ordered imaging studies including chest x-ray I independently visualized and interpreted obtained imaging which showed NAD I agree with the radiologist interpretation.   Cardiac Monitoring:  The patient was maintained on a cardiac monitor.  I personally viewed and interpreted the cardiac monitor which showed an underlying rhythm of: NSR   Medicines ordered:  I ordered medication including prednisone, DuoNeb for bronchospasm Reevaluation of the patient after these medicines showed that the patient: improved   Problem List / ED Course:  Shortness of breath   Reevaluation:  After the interventions noted above, I  reevaluated the patient and found that they have: improved    Disposition:  After consideration of the diagnostic results and the patients response to treatment, I feel that the patent would benefit from close outpatient follow-up.          Final Clinical Impression(s) / ED Diagnoses Final diagnoses:  Bronchospasm    Rx / DC Orders ED Discharge Orders          Ordered    predniSONE (DELTASONE) 10 MG tablet  Daily        05/07/21 1505              Valarie Merino, MD 05/07/21 [phone]

## 2021-05-07 NOTE — ED Triage Notes (Signed)
Pt here from home with c/o sob and cough , pt has copd and still smokes , was just released from the hospital on Monday for same

## 2021-05-07 NOTE — ED Notes (Signed)
Pt verbalized understanding of d/c instructions, meds, and followup care. Denies questions. VSS, no distress noted. Steady gait to exit with all belongings. Bus pass given.

## 2021-05-07 NOTE — Discharge Instructions (Addendum)
Return for any problem.  ?

## 2021-05-07 NOTE — ED Triage Notes (Signed)
Pt herr from home today with c/o  sob and cough has copd and still smokes , pt just released from hospital on Monday for same

## 2021-05-14 ENCOUNTER — Other Ambulatory Visit: Payer: Self-pay

## 2021-05-17 ENCOUNTER — Other Ambulatory Visit: Payer: Self-pay

## 2021-05-17 ENCOUNTER — Ambulatory Visit: Payer: Medicare Other | Attending: Internal Medicine | Admitting: Internal Medicine

## 2021-05-17 DIAGNOSIS — J441 Chronic obstructive pulmonary disease with (acute) exacerbation: Secondary | ICD-10-CM | POA: Diagnosis not present

## 2021-05-17 DIAGNOSIS — J432 Centrilobular emphysema: Secondary | ICD-10-CM | POA: Diagnosis not present

## 2021-05-17 DIAGNOSIS — F17200 Nicotine dependence, unspecified, uncomplicated: Secondary | ICD-10-CM | POA: Diagnosis not present

## 2021-05-17 DIAGNOSIS — Z09 Encounter for follow-up examination after completed treatment for conditions other than malignant neoplasm: Secondary | ICD-10-CM

## 2021-05-17 MED ORDER — SPIRIVA HANDIHALER 18 MCG IN CAPS
18.0000 ug | ORAL_CAPSULE | Freq: Every day | RESPIRATORY_TRACT | 5 refills | Status: DC
  Filled 2021-05-17: qty 30, 30d supply, fill #0

## 2021-05-17 MED ORDER — PREDNISONE 10 MG PO TABS
20.0000 mg | ORAL_TABLET | Freq: Every day | ORAL | 0 refills | Status: DC
  Filled 2021-05-17: qty 10, 5d supply, fill #0

## 2021-05-17 MED ORDER — FLUTICASONE FUROATE-VILANTEROL 100-25 MCG/ACT IN AEPB
1.0000 | INHALATION_SPRAY | Freq: Every day | RESPIRATORY_TRACT | 6 refills | Status: DC
  Filled 2021-05-17: qty 60, 60d supply, fill #0

## 2021-05-17 MED ORDER — ALBUTEROL SULFATE HFA 108 (90 BASE) MCG/ACT IN AERS
2.0000 | INHALATION_SPRAY | RESPIRATORY_TRACT | 5 refills | Status: DC | PRN
  Filled 2021-05-17: qty 8.5, 20d supply, fill #0

## 2021-05-17 NOTE — Progress Notes (Signed)
Patient ID: Jason Wong, male   DOB: [birth year], 66 y.o.   MRN: [id number] Virtual Visit via Telephone Note  I connected with Jason Wong on 05/17/2021 at 11:15 a.m by telephone and verified that I am speaking with the correct person using two identifiers  Location: Patient: home Provider: home  Participants: Myself Patient   I discussed the limitations, risks, security and privacy concerns of performing an evaluation and management service by telephone and the availability of in person appointments. I also discussed with the patient that there may be a patient responsible charge related to this service. The patient expressed understanding and agreed to proceed.   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 Last eval 03/23/2021.   Today's visit is transition of care Date of hospitalization: 2/10-13/2023 Date of call from case worker: 05/04/2021  Patient hospitalized with acute respiratory failure with hypoxia due to COPD exacerbation.  Chest x-ray showed no acute findings.  Tested negative for COVID and flu.  Patient required BiPAP, IV steroids, antibiotic and nebulizer treatments.  Patient was able to be taken off BiPAP and was not requiring oxygen at the time of hospital discharge.  Discharge on Breo Ellipta, DuoNeb nebulizer treatments, Spiriva and prednisone taper. Since then, he was seen in ED again 05/07/2021 for SOB and cough.  CXR showed no acute findings.  COVID and flu test negative. Chem Ok.  No elev WBC.  Cardiac enzymes negative.  Given duoneb and 40 mg of Prednisone.  Today:  He reports he only has 1 inhaler at home. He is unable to read the name or letters off for me to help determine what he has.  He should be on Breo, albuterol and Spiriva.  Reports Using the neb treatment BID.  He  tells me he never got the prednisone because nobody called him from the pharmacy to pick it up.  Reports breathing not as bed as it was before but not back to baseline.  He has not smoked in 5 days.  Plans to remain tobacco free.  Outpatient Encounter Medications as of 05/17/2021  Medication Sig   albuterol (VENTOLIN HFA) 108 (90 Base) MCG/ACT inhaler Inhale 2 puffs into the lungs every 4 (four) hours as needed for wheezing or shortness of breath.   amLODipine (NORVASC) 5 MG tablet Take 1 tablet (5 mg total) by mouth daily.   atorvastatin (LIPITOR) 10 MG tablet Take 1 tablet (10 mg total) by mouth daily.   azithromycin (ZITHROMAX) 250 MG tablet Take 1 tablet (250 mg total) by mouth daily.   fluticasone furoate-vilanterol (BREO ELLIPTA) 100-25 MCG/ACT AEPB Inhale 1 puff into the lungs daily.   ipratropium-albuterol (DUONEB) 0.5-2.5 (3) MG/3ML SOLN Use 1 vial by nebulization in the morning and at bedtime.   Multiple Vitamin (MULTIVITAMIN WITH MINERALS) TABS tablet Take 1 tablet by mouth daily.   naloxone (NARCAN) 2 MG/2ML injection Inject if unconscious or not breathing   nicotine (NICODERM CQ - DOSED IN MG/24 HR) 7 mg/24hr patch Place 1 patch (7 mg total) onto the skin daily.   predniSONE (DELTASONE) 10 MG tablet Take 2 tablets (20 mg total) by mouth daily.   predniSONE (DELTASONE) 5 MG tablet Take 8 tabs by mouth daily for 3 days, then 6 tabs daily for 3 days, 4 tabs daily for 3 days, 2 tabs daily for 3 days, 1 tab  daily for 3 days, then 1/2 tab daily for 3 days then STOP.   tiotropium (SPIRIVA HANDIHALER) 18 MCG inhalation capsule Place 1 capsule (18 mcg total) into inhaler and inhale daily.   No facility-administered encounter medications on file as of 05/17/2021.      Observations/Objective: No direct observation done as this was a telephone visit.  Patient was talking in complete sentences.  He did not sound dyspneic.  Assessment and Plan: 1. Hospital discharge follow-up   2. COPD  exacerbation [facility]) Prescription sent to the pharmacy for prednisone.  Patient states he will pass by the pharmacy tomorrow to pick up his medications. - predniSONE (DELTASONE) 10 MG tablet; Take 2 tablets (20 mg total) by mouth daily.  Dispense: 10 tablet; Refill: 0  3. Tobacco dependence Commended him on not smoking for the past 5 days.  Encouraged him to remain tobacco free.  4. Centrilobular emphysema (HCC) - tiotropium (SPIRIVA HANDIHALER) 18 MCG inhalation capsule; Place 1 capsule (18 mcg total) into inhaler and inhale daily.  Dispense: 30 capsule; Refill: 5 - albuterol (VENTOLIN HFA) 108 (90 Base) MCG/ACT inhaler; Inhale 2 puffs into the lungs every 4 (four) hours as needed for wheezing or shortness of breath.  Dispense: 8.5 g; Refill: 5 - fluticasone furoate-vilanterol (BREO ELLIPTA) 100-25 MCG/ACT AEPB; Inhale 1 puff into the lungs daily.  Dispense: 60 each; Refill: 6   Follow Up Instructions: F/u PRN and Keep previously scheduled follow-up in May.   I discussed the assessment and treatment plan with the patient. The patient was provided an opportunity to ask questions and all were answered. The patient agreed with the plan and demonstrated an understanding of the instructions.   The patient was advised to call back or seek an in-person evaluation if the symptoms worsen or if the condition fails to improve as anticipated.  I  Spent 12 minutes on this telephone encounter  This note has been created with Surveyor, quantity. Any transcriptional errors are unintentional.  Karle Plumber, MD

## 2021-05-18 ENCOUNTER — Other Ambulatory Visit: Payer: Self-pay

## 2021-06-14 ENCOUNTER — Emergency Department (HOSPITAL_COMMUNITY): Payer: Medicare Other

## 2021-06-14 ENCOUNTER — Other Ambulatory Visit: Payer: Self-pay

## 2021-06-14 ENCOUNTER — Encounter (HOSPITAL_COMMUNITY): Payer: Self-pay

## 2021-06-14 ENCOUNTER — Inpatient Hospital Stay (HOSPITAL_COMMUNITY)
Admission: EM | Admit: 2021-06-14 | Discharge: 2021-06-21 | DRG: 897 | Disposition: A | Discharge status: Home / Self Care | Payer: Medicare Other | Attending: Internal Medicine | Admitting: Internal Medicine

## 2021-06-14 DIAGNOSIS — F17200 Nicotine dependence, unspecified, uncomplicated: Secondary | ICD-10-CM | POA: Diagnosis present

## 2021-06-14 DIAGNOSIS — Z8249 Family history of ischemic heart disease and other diseases of the circulatory system: Secondary | ICD-10-CM

## 2021-06-14 DIAGNOSIS — E785 Hyperlipidemia, unspecified: Secondary | ICD-10-CM | POA: Diagnosis present

## 2021-06-14 DIAGNOSIS — F14188 Cocaine abuse with other cocaine-induced disorder: Secondary | ICD-10-CM | POA: Diagnosis present

## 2021-06-14 DIAGNOSIS — J441 Chronic obstructive pulmonary disease with (acute) exacerbation: Secondary | ICD-10-CM | POA: Diagnosis present

## 2021-06-14 DIAGNOSIS — Z79899 Other long term (current) drug therapy: Secondary | ICD-10-CM | POA: Diagnosis not present

## 2021-06-14 DIAGNOSIS — E876 Hypokalemia: Secondary | ICD-10-CM | POA: Diagnosis not present

## 2021-06-14 DIAGNOSIS — Z800 Family history of malignant neoplasm of digestive organs: Secondary | ICD-10-CM | POA: Diagnosis not present

## 2021-06-14 DIAGNOSIS — J439 Emphysema, unspecified: Secondary | ICD-10-CM | POA: Diagnosis not present

## 2021-06-14 DIAGNOSIS — Z7952 Long term (current) use of systemic steroids: Secondary | ICD-10-CM

## 2021-06-14 DIAGNOSIS — Z743 Need for continuous supervision: Secondary | ICD-10-CM | POA: Diagnosis not present

## 2021-06-14 DIAGNOSIS — Z681 Body mass index (BMI) 19 or less, adult: Secondary | ICD-10-CM | POA: Diagnosis not present

## 2021-06-14 DIAGNOSIS — R0603 Acute respiratory distress: Secondary | ICD-10-CM | POA: Diagnosis present

## 2021-06-14 DIAGNOSIS — J449 Chronic obstructive pulmonary disease, unspecified: Secondary | ICD-10-CM | POA: Diagnosis not present

## 2021-06-14 DIAGNOSIS — J9601 Acute respiratory failure with hypoxia: Secondary | ICD-10-CM | POA: Diagnosis not present

## 2021-06-14 DIAGNOSIS — F17210 Nicotine dependence, cigarettes, uncomplicated: Secondary | ICD-10-CM | POA: Diagnosis present

## 2021-06-14 DIAGNOSIS — R059 Cough, unspecified: Secondary | ICD-10-CM | POA: Diagnosis not present

## 2021-06-14 DIAGNOSIS — T502X5A Adverse effect of carbonic-anhydrase inhibitors, benzothiadiazides and other diuretics, initial encounter: Secondary | ICD-10-CM | POA: Diagnosis not present

## 2021-06-14 DIAGNOSIS — Z20822 Contact with and (suspected) exposure to covid-19: Secondary | ICD-10-CM | POA: Diagnosis present

## 2021-06-14 DIAGNOSIS — R069 Unspecified abnormalities of breathing: Secondary | ICD-10-CM | POA: Diagnosis not present

## 2021-06-14 DIAGNOSIS — I10 Essential (primary) hypertension: Secondary | ICD-10-CM | POA: Diagnosis present

## 2021-06-14 DIAGNOSIS — J432 Centrilobular emphysema: Secondary | ICD-10-CM

## 2021-06-14 DIAGNOSIS — E440 Moderate protein-calorie malnutrition: Secondary | ICD-10-CM | POA: Diagnosis present

## 2021-06-14 DIAGNOSIS — Z7951 Long term (current) use of inhaled steroids: Secondary | ICD-10-CM | POA: Diagnosis not present

## 2021-06-14 DIAGNOSIS — R0600 Dyspnea, unspecified: Secondary | ICD-10-CM | POA: Diagnosis not present

## 2021-06-14 HISTORY — DX: Chronic obstructive pulmonary disease, unspecified: J44.9

## 2021-06-14 HISTORY — DX: Hyperlipidemia, unspecified: E78.5

## 2021-06-14 LAB — CBC WITH DIFFERENTIAL/PLATELET
Abs Immature Granulocytes: 0.01 10*3/uL (ref 0.00–0.07)
Abs Immature Granulocytes: 0.02 10*3/uL (ref 0.00–0.07)
Basophils Absolute: 0.1 10*3/uL (ref 0.0–0.1)
Basophils Absolute: 0 10*3/uL (ref 0.0–0.1)
Basophils Relative: 1 %
Basophils Relative: 0 %
Eosinophils Absolute: 1.1 10*3/uL — ABNORMAL HIGH (ref 0.0–0.5)
Eosinophils Absolute: 0 10*3/uL (ref 0.0–0.5)
Eosinophils Relative: 11 %
Eosinophils Relative: 0 %
HCT: 39.4 % (ref 39.0–52.0)
HCT: 37.5 % — ABNORMAL LOW (ref 39.0–52.0)
Hemoglobin: 13.7 g/dL (ref 13.0–17.0)
Hemoglobin: 12.4 g/dL — ABNORMAL LOW (ref 13.0–17.0)
Immature Granulocytes: 0 %
Immature Granulocytes: 0 %
Lymphocytes Relative: 37 %
Lymphocytes Relative: 7 %
Lymphs Abs: 3.5 10*3/uL (ref 0.7–4.0)
Lymphs Abs: 0.5 10*3/uL — ABNORMAL LOW (ref 0.7–4.0)
MCH: 34.6 pg — ABNORMAL HIGH (ref 26.0–34.0)
MCH: 33.6 pg (ref 26.0–34.0)
MCHC: 34.8 g/dL (ref 30.0–36.0)
MCHC: 33.1 g/dL (ref 30.0–36.0)
MCV: 99.5 fL (ref 80.0–100.0)
MCV: 101.6 fL — ABNORMAL HIGH (ref 80.0–100.0)
Monocytes Absolute: 0.8 10*3/uL (ref 0.1–1.0)
Monocytes Absolute: 0.1 10*3/uL (ref 0.1–1.0)
Monocytes Relative: 8 %
Monocytes Relative: 1 %
Neutro Abs: 4.1 10*3/uL (ref 1.7–7.7)
Neutro Abs: 6.6 10*3/uL (ref 1.7–7.7)
Neutrophils Relative %: 43 %
Neutrophils Relative %: 92 %
Platelets: 253 10*3/uL (ref 150–400)
Platelets: 258 10*3/uL (ref 150–400)
RBC: 3.96 MIL/uL — ABNORMAL LOW (ref 4.22–5.81)
RBC: 3.69 MIL/uL — ABNORMAL LOW (ref 4.22–5.81)
RDW: 13.2 % (ref 11.5–15.5)
RDW: 13.2 % (ref 11.5–15.5)
WBC: 9.5 10*3/uL (ref 4.0–10.5)
WBC: 7.2 10*3/uL (ref 4.0–10.5)
nRBC: 0 % (ref 0.0–0.2)
nRBC: 0 % (ref 0.0–0.2)

## 2021-06-14 LAB — COMPREHENSIVE METABOLIC PANEL
ALT: 17 U/L (ref 0–44)
AST: 20 U/L (ref 15–41)
Albumin: 3.9 g/dL (ref 3.5–5.0)
Alkaline Phosphatase: 59 U/L (ref 38–126)
Anion gap: 12 (ref 5–15)
BUN: 10 mg/dL (ref 8–23)
CO2: 19 mmol/L — ABNORMAL LOW (ref 22–32)
Calcium: 8.8 mg/dL — ABNORMAL LOW (ref 8.9–10.3)
Chloride: 111 mmol/L (ref 98–111)
Creatinine, Ser: 0.81 mg/dL (ref 0.61–1.24)
GFR, Estimated: >60 mL/min (ref >60)
Glucose, Bld: 141 mg/dL — ABNORMAL HIGH (ref 70–99)
Potassium: 3.2 mmol/L — ABNORMAL LOW (ref 3.5–5.1)
Sodium: 142 mmol/L (ref 135–145)
Total Bilirubin: 0.6 mg/dL (ref 0.3–1.2)
Total Protein: 6.7 g/dL (ref 6.5–8.1)

## 2021-06-14 LAB — I-STAT VENOUS BLOOD GAS, ED
Acid-base deficit: 2 mmol/L (ref 0.0–2.0)
Bicarbonate: 20.6 mmol/L (ref 20.0–28.0)
Calcium, Ion: 1.05 mmol/L — ABNORMAL LOW (ref 1.15–1.40)
HCT: 36 % — ABNORMAL LOW (ref 39.0–52.0)
Hemoglobin: 12.2 g/dL — ABNORMAL LOW (ref 13.0–17.0)
O2 Saturation: 99 %
Potassium: 3.2 mmol/L — ABNORMAL LOW (ref 3.5–5.1)
Sodium: 142 mmol/L (ref 135–145)
TCO2: 21 mmol/L — ABNORMAL LOW (ref 22–32)
pCO2, Ven: 29.2 mm[Hg] — ABNORMAL LOW (ref 44–60)
pH, Ven: 7.455 — ABNORMAL HIGH (ref 7.25–7.43)
pO2, Ven: 109 mm[Hg] — ABNORMAL HIGH (ref 32–45)

## 2021-06-14 LAB — RAPID URINE DRUG SCREEN, HOSP PERFORMED
Amphetamines: NOT DETECTED
Barbiturates: NOT DETECTED
Benzodiazepines: NOT DETECTED
Cocaine: POSITIVE — AB
Opiates: NOT DETECTED
Tetrahydrocannabinol: NOT DETECTED

## 2021-06-14 LAB — MAGNESIUM: Magnesium: 2.2 mg/dL (ref 1.7–2.4)

## 2021-06-14 LAB — URINALYSIS, COMPLETE (UACMP) WITH MICROSCOPIC
Bacteria, UA: NONE SEEN
Bilirubin Urine: NEGATIVE
Glucose, UA: NEGATIVE mg/dL
Hgb urine dipstick: NEGATIVE
Ketones, ur: NEGATIVE mg/dL
Leukocytes,Ua: NEGATIVE
Nitrite: NEGATIVE
Protein, ur: NEGATIVE mg/dL
Specific Gravity, Urine: 1.012 (ref 1.005–1.030)
pH: 5 (ref 5.0–8.0)

## 2021-06-14 LAB — PROCALCITONIN: Procalcitonin: <0.1 ng/mL

## 2021-06-14 LAB — BASIC METABOLIC PANEL
Anion gap: 10 (ref 5–15)
BUN: 9 mg/dL (ref 8–23)
CO2: 23 mmol/L (ref 22–32)
Calcium: 9.5 mg/dL (ref 8.9–10.3)
Chloride: 109 mmol/L (ref 98–111)
Creatinine, Ser: 0.85 mg/dL (ref 0.61–1.24)
GFR, Estimated: >60 mL/min (ref >60)
Glucose, Bld: 108 mg/dL — ABNORMAL HIGH (ref 70–99)
Potassium: 3.5 mmol/L (ref 3.5–5.1)
Sodium: 142 mmol/L (ref 135–145)

## 2021-06-14 LAB — PHOSPHORUS: Phosphorus: 2.9 mg/dL (ref 2.5–4.6)

## 2021-06-14 LAB — RESP PANEL BY RT-PCR (FLU A&B, COVID) ARPGX2
Influenza A by PCR: NEGATIVE
Influenza B by PCR: NEGATIVE
SARS Coronavirus 2 by RT PCR: NEGATIVE

## 2021-06-14 LAB — TROPONIN I (HIGH SENSITIVITY)
Troponin I (High Sensitivity): 7 ng/L (ref <18)
Troponin I (High Sensitivity): 8 ng/L (ref <18)

## 2021-06-14 LAB — BRAIN NATRIURETIC PEPTIDE: B Natriuretic Peptide: 61.2 pg/mL (ref 0.0–100.0)

## 2021-06-14 MED ORDER — IPRATROPIUM-ALBUTEROL 0.5-2.5 (3) MG/3ML IN SOLN
3.0000 mL | Freq: Once | RESPIRATORY_TRACT | Status: AC
Start: 2021-06-14 — End: 2021-06-14
  Administered 2021-06-14: 3 mL via RESPIRATORY_TRACT
  Filled 2021-06-14: qty 3

## 2021-06-14 MED ORDER — IPRATROPIUM-ALBUTEROL 0.5-2.5 (3) MG/3ML IN SOLN
3.0000 mL | Freq: Four times a day (QID) | RESPIRATORY_TRACT | Status: DC
Start: 2021-06-14 — End: 2021-06-15
  Administered 2021-06-14 (×3): 3 mL via RESPIRATORY_TRACT
  Filled 2021-06-14 (×3): qty 3

## 2021-06-14 MED ORDER — HYDRALAZINE HCL 20 MG/ML IJ SOLN
10.0000 mg | INTRAMUSCULAR | Status: DC | PRN
Start: 2021-06-14 — End: 2021-06-22

## 2021-06-14 MED ORDER — ALBUTEROL SULFATE (2.5 MG/3ML) 0.083% IN NEBU
10.0000 mg/h | INHALATION_SOLUTION | Freq: Once | RESPIRATORY_TRACT | Status: AC
Start: 2021-06-14 — End: 2021-06-14
  Administered 2021-06-14: 10 mg/h via RESPIRATORY_TRACT
  Filled 2021-06-14: qty 12

## 2021-06-14 MED ORDER — SODIUM CHLORIDE 0.9 % IV BOLUS
1000.0000 mL | Freq: Once | INTRAVENOUS | Status: AC
  Administered 2021-06-14: 1000 mL via INTRAVENOUS

## 2021-06-14 MED ORDER — METHYLPREDNISOLONE SODIUM SUCC 125 MG IJ SOLR
120.0000 mg | INTRAMUSCULAR | Status: DC
  Administered 2021-06-15 – 2021-06-16 (×2): 120 mg via INTRAVENOUS
  Filled 2021-06-14 (×3): qty 2

## 2021-06-14 MED ORDER — BENZONATATE 100 MG PO CAPS
200.0000 mg | ORAL_CAPSULE | Freq: Three times a day (TID) | ORAL | Status: DC | PRN
  Administered 2021-06-14 – 2021-06-16 (×2): 200 mg via ORAL
  Filled 2021-06-14 (×2): qty 2

## 2021-06-14 MED ORDER — MAGNESIUM SULFATE 2 GM/50ML IV SOLN
2.0000 g | Freq: Once | INTRAVENOUS | Status: AC
  Administered 2021-06-14: 2 g via INTRAVENOUS
  Filled 2021-06-14: qty 50

## 2021-06-14 MED ORDER — NICOTINE 14 MG/24HR TD PT24
14.0000 mg | MEDICATED_PATCH | Freq: Every day | TRANSDERMAL | Status: DC
  Administered 2021-06-15 – 2021-06-21 (×7): 14 mg via TRANSDERMAL
  Filled 2021-06-14 (×7): qty 1

## 2021-06-14 MED ORDER — TRAZODONE HCL 50 MG PO TABS
50.0000 mg | ORAL_TABLET | Freq: Every evening | ORAL | Status: DC | PRN
  Administered 2021-06-19 – 2021-06-20 (×2): 50 mg via ORAL
  Filled 2021-06-14 (×3): qty 1

## 2021-06-14 MED ORDER — NICOTINE 14 MG/24HR TD PT24: 14.0000 mg | MEDICATED_PATCH | Freq: Every day | TRANSDERMAL | Status: DC | PRN

## 2021-06-14 MED ORDER — BUDESONIDE 0.5 MG/2ML IN SUSP
0.5000 mg | Freq: Two times a day (BID) | RESPIRATORY_TRACT | Status: DC
  Administered 2021-06-14 – 2021-06-20 (×13): 0.5 mg via RESPIRATORY_TRACT
  Filled 2021-06-14 (×13): qty 2

## 2021-06-14 MED ORDER — METHYLPREDNISOLONE SODIUM SUCC 125 MG IJ SOLR
80.0000 mg | Freq: Two times a day (BID) | INTRAMUSCULAR | Status: DC
  Administered 2021-06-14: 80 mg via INTRAVENOUS
  Filled 2021-06-14: qty 2

## 2021-06-14 MED ORDER — ENSURE ENLIVE PO LIQD
237.0000 mL | Freq: Two times a day (BID) | ORAL | Status: DC
  Administered 2021-06-15 – 2021-06-21 (×13): 237 mL via ORAL

## 2021-06-14 MED ORDER — SENNOSIDES-DOCUSATE SODIUM 8.6-50 MG PO TABS: 1.0000 | ORAL_TABLET | Freq: Every evening | ORAL | Status: DC | PRN

## 2021-06-14 MED ORDER — SODIUM CHLORIDE 0.9 % IV SOLN
500.0000 mg | INTRAVENOUS | Status: AC
  Administered 2021-06-14 – 2021-06-15 (×2): 500 mg via INTRAVENOUS
  Filled 2021-06-14 (×3): qty 5

## 2021-06-14 MED ORDER — AMLODIPINE BESYLATE 5 MG PO TABS
5.0000 mg | ORAL_TABLET | Freq: Every day | ORAL | Status: DC
  Administered 2021-06-14 – 2021-06-21 (×8): 5 mg via ORAL
  Filled 2021-06-14 (×8): qty 1

## 2021-06-14 MED ORDER — IPRATROPIUM BROMIDE 0.02 % IN SOLN
0.5000 mg | Freq: Once | RESPIRATORY_TRACT | Status: AC
  Administered 2021-06-14: 0.5 mg via RESPIRATORY_TRACT
  Filled 2021-06-14: qty 2.5

## 2021-06-14 MED ORDER — ACETAMINOPHEN 650 MG RE SUPP: 650.0000 mg | Freq: Four times a day (QID) | RECTAL | Status: DC | PRN

## 2021-06-14 MED ORDER — ACETAMINOPHEN 325 MG PO TABS
650.0000 mg | ORAL_TABLET | Freq: Four times a day (QID) | ORAL | Status: DC | PRN
  Administered 2021-06-14 – 2021-06-20 (×3): 650 mg via ORAL
  Filled 2021-06-14 (×4): qty 2

## 2021-06-14 MED ORDER — ATORVASTATIN CALCIUM 10 MG PO TABS
10.0000 mg | ORAL_TABLET | Freq: Every day | ORAL | Status: DC
  Administered 2021-06-14 – 2021-06-21 (×8): 10 mg via ORAL
  Filled 2021-06-14 (×8): qty 1

## 2021-06-14 MED ORDER — DM-GUAIFENESIN ER 30-600 MG PO TB12
1.0000 | ORAL_TABLET | Freq: Two times a day (BID) | ORAL | Status: DC
  Administered 2021-06-14 – 2021-06-16 (×5): 1 via ORAL
  Filled 2021-06-14 (×7): qty 1

## 2021-06-14 MED ORDER — ALBUTEROL SULFATE (2.5 MG/3ML) 0.083% IN NEBU: 2.5000 mg | INHALATION_SOLUTION | RESPIRATORY_TRACT | Status: DC | PRN

## 2021-06-14 NOTE — Assessment & Plan Note (Addendum)
Continue home Norvasc.  IV hydralazine as needed ?  ?

## 2021-06-14 NOTE — ED Provider Notes (Signed)
?[facility] ?Provider Note ? ? ?CSN: [id number] ?Arrival date & time: 06/14/21  9379 ? ?  ? ?History ? ?Chief Complaint  ?Patient presents with  ? Shortness of Breath  ? ? ?Jason Wong is a 66 y.o. male with a hx of COPD, asthma, tobacco use, EtOH use, hypertension, and hepatitis C who presents to the ED with complaints of dyspnea that began earlier today. Constant, no alleviating/aggravating factors, utilizing neb/inhaler txs at home without improvement. Per EMS saturating mid 90s on RA, had just finished albuterol neb at home therefore they gave atrovent neb and 125 mg of solumedrol en route. Patient reports associated cough with white phlegm sputum production as well as wheezing. Denies chest pain, hemotpysis, leg pain/swelling, or nausea/vomiting.  ?HPI ? ?  ? ?Home Medications ?Prior to Admission medications   ?Medication Sig Start Date End Date Taking? Authorizing Provider  ?albuterol (VENTOLIN HFA) 108 (90 Base) MCG/ACT inhaler Inhale 2 puffs into the lungs every 4 (four) hours as needed for wheezing or shortness of breath. 05/17/21   Ladell Pier, MD  ?amLODipine (NORVASC) 5 MG tablet Take 1 tablet (5 mg total) by mouth daily. 03/23/21   Ladell Pier, MD  ?atorvastatin (LIPITOR) 10 MG tablet Take 1 tablet (10 mg total) by mouth daily. 03/23/21   Ladell Pier, MD  ?azithromycin (ZITHROMAX) 250 MG tablet Take 1 tablet (250 mg total) by mouth daily. 05/03/21   Thurnell Lose, MD  ?fluticasone furoate-vilanterol (BREO ELLIPTA) 100-25 MCG/ACT AEPB Inhale 1 puff into the lungs daily. 05/17/21   Ladell Pier, MD  ?ipratropium-albuterol (DUONEB) 0.5-2.5 (3) MG/3ML SOLN Use 1 vial by nebulization in the morning and at bedtime. 05/03/21   Thurnell Lose, MD  ?Multiple Vitamin (MULTIVITAMIN WITH MINERALS) TABS tablet Take 1 tablet by mouth daily.    [provider]  ?naloxone [facility]) 2 MG/2ML injection Inject if unconscious or not breathing 08/21/20    Pearson Grippe, DO  ?nicotine (NICODERM CQ - DOSED IN MG/24 HR) 7 mg/24hr patch Place 1 patch (7 mg total) onto the skin daily. 05/03/21   Thurnell Lose, MD  ?predniSONE (DELTASONE) 10 MG tablet Take 2 tablets (20 mg total) by mouth daily. 05/17/21   Ladell Pier, MD  ?tiotropium (SPIRIVA HANDIHALER) 18 MCG inhalation capsule Place 1 capsule (18 mcg total) into inhaler and inhale daily. 05/17/21   Ladell Pier, MD  ?   ? ?Allergies    ?Tobacco   ? ?Review of Systems   ?Review of Systems  ?Constitutional:  Negative for chills and fever.  ?Respiratory:  Positive for cough, shortness of breath and wheezing.   ?Cardiovascular:  Negative for chest pain and leg swelling.  ?Gastrointestinal:  Negative for abdominal pain, nausea and vomiting.  ?Neurological:  Negative for syncope.  ?All other systems reviewed and are negative. ? ?Physical Exam ?Updated Vital Signs ?BP 134/82   Pulse 80   Temp 97.9 ?F (36.6 ?C)   Resp 18   SpO2 96%  ?Physical Exam ?Vitals and nursing note reviewed.  ?Constitutional:   ?   General: He is not in acute distress. ?   Appearance: He is well-developed. He is not toxic-appearing.  ?HENT:  ?   Head: Normocephalic and atraumatic.  ?Eyes:  ?   General:     ?   Right eye: No discharge.     ?   Left eye: No discharge.  ?   Conjunctiva/sclera: Conjunctivae normal.  ?Cardiovascular:  ?  Rate and Rhythm: Normal rate and regular rhythm.  ?Pulmonary:  ?   Effort: Tachypnea present. No respiratory distress.  ?   Breath sounds: Decreased air movement present. Wheezing (biphasic) present. No rales.  ?Abdominal:  ?   General: There is no distension.  ?   Palpations: Abdomen is soft.  ?   Tenderness: There is no abdominal tenderness.  ?Musculoskeletal:  ?   Cervical back: Neck supple.  ?   Right lower leg: No tenderness. No edema.  ?   Left lower leg: No tenderness. No edema.  ?Skin: ?   General: Skin is warm and dry.  ?Neurological:  ?   Mental Status: He is alert.  ?   Comments: Clear  speech.   ?Psychiatric:     ?   Behavior: Behavior normal.  ? ? ?ED Results / Procedures / Treatments   ?Labs ?(all labs ordered are listed, but only abnormal results are displayed) ?Labs Reviewed  ?BASIC METABOLIC PANEL - Abnormal; Notable for the following components:  ?    Result Value  ? Glucose, Bld 108 (*)   ? All other components within normal limits  ?CBC WITH DIFFERENTIAL/PLATELET - Abnormal; Notable for the following components:  ? RBC 3.96 (*)   ? MCH 34.6 (*)   ? Eosinophils Absolute 1.1 (*)   ? All other components within normal limits  ?RESP PANEL BY RT-PCR (FLU A&B, COVID) ARPGX2  ? ? ?EKG ?None ? ?Radiology ?DG Chest Port 1 View ? ?Result Date: 06/14/2021 ?CLINICAL DATA:  Dyspnea and coughing. EXAM: PORTABLE CHEST 1 VIEW COMPARISON:  PA Lat 05/07/2021. FINDINGS: Heart size and vasculature are normal aside from aortic atherosclerosis. The mediastinum is stable. The lungs are mildly emphysematous but clear. No pleural effusion is seen. No acute osseous findings with mild osteopenia. IMPRESSION: No active disease.  Stable COPD chest.  Aortic atherosclerosis. Electronically Signed   By: Telford Nab M.D.   On: 06/14/2021 02:56   ? ?Procedures ?Marland KitchenCritical Care ?Performed by: Amaryllis Dyke, PA-C ?Authorized by: Amaryllis Dyke, PA-C  ?  ?CRITICAL CARE ?Performed by: Kennith Maes ? ? ?Total critical care time: 40 minutes ? ?Critical care time was exclusive of separately billable procedures and treating other patients. ? ?Critical care was necessary to treat or prevent imminent or life-threatening deterioration. ? ?Critical care was time spent personally by me on the following activities: development of treatment plan with patient and/or surrogate as well as nursing, discussions with consultants, evaluation of patient's response to treatment, examination of patient, obtaining history from patient or surrogate, ordering and performing treatments and interventions, ordering and review of  laboratory studies, ordering and review of radiographic studies, pulse oximetry and re-evaluation of patient's condition. ? ? ? ?Medications Ordered in ED ?Medications  ?ipratropium (ATROVENT) nebulizer solution 0.5 mg (has no administration in time range)  ?albuterol (PROVENTIL) (2.5 MG/3ML) 0.083% nebulizer solution (has no administration in time range)  ?sodium chloride 0.9 % bolus 1,000 mL (has no administration in time range)  ?ipratropium-albuterol (DUONEB) 0.5-2.5 (3) MG/3ML nebulizer solution 3 mL (3 mLs Nebulization Given 06/14/21 0243)  ? ? ?ED Course/ Medical Decision Making/ A&P ?  ?                        ?Medical Decision Making ?Amount and/or Complexity of Data Reviewed ?Labs: ordered. ?Radiology: ordered. ? ?Risk ?Prescription drug management. ? ? ?Patient presents to the ED with complaints of dyspnea, this involves an extensive number  of treatment options, and is a complaint that carries with it a high risk of complications and morbidity. Nontoxic, vitals with mild tachypnea. Biphasic wheezing, poor air movement.  ? ?DDX including but not limited to: COPD exacerbation, pneumonia, pneumothorax, arrhythmia, PE ? ?Additional history obtained:  ?Chart & nursing note reviewed.  ?Prior hospital admission- required bipap.  ?EMS- medications administered ? ?EKG: no STEMI ? ?Lab Tests:  ?I reviewed & interpreted labs including:  ?CBC/BMP: no critical anemia or electrolyte derangement.  ?Covid/influenza: Negative.  ? ?Imaging Studies ordered:  ?I ordered and viewed the following imaging, agree with radiologist impression:  ?No active disease.  Stable COPD chest.  Aortic atherosclerosis. ? ?ED Course:  ?I ordered medications including duoneb for dyspnea- ?03:15: RE-EVAL: Resting comfortably however reports no improvement, remains w/ prominent wheezing, will start cont. Albuterol neb.  ? ?05:15: RE-EVAL: SpO2 @ rest 88-92% on RA, w/ ambulation patient desaturated as low as 84% on RA, reports continued increased  work of breathing, he relays he feels similar to when he has needed to be admitted previously, will administer magnesium & discuss w/ hospitalist service for admission.  ? ?Findings and plan of care discusse

## 2021-06-14 NOTE — Care Management Obs Status (Signed)
MEDICARE OBSERVATION STATUS NOTIFICATION ? ? ?Patient Details  ?Name: Jason Wong ?MRN: [id number] ?Date of Birth: Jul 03, 1955 ? ? ?Medicare Observation Status Notification Given:  Yes ? ? ? Laurena Slimmer, RN ?06/14/2021, 4:38 PM ?

## 2021-06-14 NOTE — H&P (Addendum)
?History and Physical  ? ? ?PLEASE NOTE THAT [employer]AGON [employer] SOFTWARE WAS USED IN THE CONSTRUCTION OF THIS NOTE. ? ? ?Jason Wong OZD:[id number] DOB: 1955/12/30 DOA: 06/14/2021 ? ?PCP: Ladell Pier, MD  ?Patient coming from: home  ? ?I have personally briefly reviewed patient's old medical records in [facility] ? ?Chief Complaint: Shortness of breath ? ?HPI: Jason Wong is a 66 y.o. male with medical history significant for COPD, chronic tobacco abuse, hypertension, hyperlipidemia,  who is admitted to [facility] on 06/14/2021 with acute COPD exacerbation after presenting from home to [facility] ED complaining of shortness of breath.  ? ?The patient reports 1 to 2 days of progressive shortness of breath associated with new onset nonproductive cough as well as wheezing.  Denies any associated orthopnea, PND, or worsening of peripheral edema.  He also denies any associated chest pain, palpitations, diaphoresis, nausea, vomiting, dizziness, presyncope, or syncope.  Not associated with any hemoptysis, new lower extremity erythema, or calf tenderness.  No recent trauma or travel.  Denies any recent periods of prolonged diminished ambulatory status, nor any recent surgical procedures.  Not associate with any subjective fever, chills, rigors, or generalized myalgias.  Not associate any rash, abdominal pain, diarrhea..  ? ?He acknowledges a history of COPD with no known baseline supplemental oxygen requirements.  He conveys that he is a long-term cigarette smoker, having smoked approximately half pack per day for nearly 50 years, and that he continues to smoke this daily rate.  Per chart review, there is also documentation of a history of cocaine use, although the patient denies any recent recreational drug use to me. ? ?Reports good compliance with the same respiratory regimen which consists of scheduled Breo Ellipta as well as scheduled Spiriva, in addition to prn albuterol inhaler.  Over the last 1 to 2 days,  he notes increasing frequency of use of his prn albuterol inhaler, but without any significant improvement in his respiratory status, ultimately prompting him to present to [facility] emergency department today via EMS for further evaluation management of such. ? ?Of note, he received Solu-Medrol 125 mg IV x1 via EMS in route to the emergency department this evening. ? ? ? ? ?ED Course:  ?Vital signs in the ED were notable for the following: Afebrile; heart rate 77-96; blood pressure 122/89; respiratory rate 17-22, oxygen saturation initially noted to be 88% on room air, subsequently improving into the range of 92 to 93% on 2 L nasal cannula. ? ?Labs were notable for the following: BMP notable for the following: Bicarbonate 23, creatinine 0.85.  CBC notable for white blood cell count 9500 with 43% neutrophils, hemoglobin 13.7.  COVID-19/influenza PCR negative. ? ?Imaging and additional notable ED work-up: EKG, in comparison to most recent prior from 05/10/2021 shows sinus rhythm with heart rate 92, normal intervals, nonspecific T wave inversion in aVL, unchanged from most recent prior EKG, no evidence of ST changes, including no evidence of ST elevation. ? ?While in the ED, the following were administered: Duo nebulizer treatment x1, hour-long albuterol nebulizer x1, magnesium sulfate 2 g IV over 2 hours x 1 dose, as well as a 1 L normal saline bolus.  ? ?Subsequently, the patient was admitted for further evaluation management of acute COPD exacerbation complicated by acute hypoxic respiratory distress.  ? ? ? ?Review of Systems: As per HPI otherwise 10 point review of systems negative.  ? ?Past Medical History:  ?Diagnosis Date  ? Asthma   ? COPD (chronic  obstructive pulmonary disease) ([place])   ? Hepatitis C antibody positive in blood   ? HLD (hyperlipidemia)   ? HTN (hypertension)   ? ? ?Past Surgical History:  ?Procedure Laterality Date  ? INCISE AND DRAIN ABCESS    ? dog bite  ? ? ?Social History: ? reports that  he has been smoking cigarettes. He has a 22.50 pack-year smoking history. He has never used smokeless tobacco. He reports current alcohol use of about 14.0 standard drinks per week. He reports that he does not currently use drugs after having used the following drugs: "Crack" cocaine. ? ? ?Allergies  ?Allergen Reactions  ? Tobacco Shortness Of Breath and Other (See Comments)  ?  CIGARETTE SMOKE TRIGGERS THE PATIENT'S RESPIRATORY ISSUES!!  ? ? ?Family History  ?Problem Relation Age of Onset  ? Hypertension Mother   ? Breast cancer Sister   ? Hypertension Sister   ? Colon cancer Brother   ? Colon polyps Neg Hx   ? Esophageal cancer Neg Hx   ? Rectal cancer Neg Hx   ? Stomach cancer Neg Hx   ? ? ?Family history reviewed and not pertinent  ? ? ?Prior to Admission medications   ?Medication Sig Start Date End Date Taking? Authorizing Provider  ?albuterol (VENTOLIN HFA) 108 (90 Base) MCG/ACT inhaler Inhale 2 puffs into the lungs every 4 (four) hours as needed for wheezing or shortness of breath. 05/17/21  Yes Ladell Pier, MD  ?amLODipine (NORVASC) 5 MG tablet Take 1 tablet (5 mg total) by mouth daily. 03/23/21  Yes Ladell Pier, MD  ?atorvastatin (LIPITOR) 10 MG tablet Take 1 tablet (10 mg total) by mouth daily. 03/23/21  Yes Ladell Pier, MD  ?fluticasone furoate-vilanterol (BREO ELLIPTA) 100-25 MCG/ACT AEPB Inhale 1 puff into the lungs daily. 05/17/21  Yes Ladell Pier, MD  ?ipratropium-albuterol (DUONEB) 0.5-2.5 (3) MG/3ML SOLN Use 1 vial by nebulization in the morning and at bedtime. 05/03/21  Yes Thurnell Lose, MD  ?Multiple Vitamin (MULTIVITAMIN WITH MINERALS) TABS tablet Take 1 tablet by mouth daily.   Yes [provider]  ?tiotropium (SPIRIVA HANDIHALER) 18 MCG inhalation capsule Place 1 capsule (18 mcg total) into inhaler and inhale daily. 05/17/21  Yes Ladell Pier, MD  ?azithromycin (ZITHROMAX) 250 MG tablet Take 1 tablet (250 mg total) by mouth daily. ?Patient not taking:  Reported on 06/14/2021 05/03/21   Thurnell Lose, MD  ?naloxone [facility]) 2 MG/2ML injection Inject if unconscious or not breathing ?Patient not taking: Reported on 06/14/2021 08/21/20   Pearson Grippe, DO  ?nicotine (NICODERM CQ - DOSED IN MG/24 HR) 7 mg/24hr patch Place 1 patch (7 mg total) onto the skin daily. ?Patient not taking: Reported on 06/14/2021 05/03/21   Thurnell Lose, MD  ?predniSONE (DELTASONE) 10 MG tablet Take 2 tablets (20 mg total) by mouth daily. ?Patient not taking: Reported on 06/14/2021 05/17/21   Ladell Pier, MD  ? ? ? ?Objective  ? ? ?Physical Exam: ?Vitals:  ? 06/14/21 0445 06/14/21 0457 06/14/21 0515 06/14/21 0530  ?BP: 133/84  (!) 134/92 134/86  ?Pulse: 88 93 86 86  ?Resp: '14 14 15   '$ ?Temp:      ?SpO2: 92% (!) 88% 92% 90%  ? ? ?General: appears to be stated age; alert, oriented; mildly increased work of breathing noted ?Skin: warm, dry, no rash ?Head:  AT/ ?Mouth:  Oral mucosa membranes appear moist, normal dentition ?Neck: supple; trachea midline ?Heart:  RRR; did not appreciate  any M/R/G ?Lungs: Bilateral expiratory wheezes noted, but otherwise CTAB, did not appreciate any , rales, or rhonchi ?Abdomen: + BS; soft, ND, NT ?Vascular: 2+ pedal pulses b/l; 2+ radial pulses b/l ?Extremities: no peripheral edema, no muscle wasting ?Neuro: strength and sensation intact in upper and lower extremities b/l ? ? ? ? ?Labs on Admission: I have personally reviewed following labs and imaging studies ? ?CBC: ?Recent Labs  ?Lab 06/14/21 ?0238  ?WBC 9.5  ?NEUTROABS 4.1  ?HGB 13.7  ?HCT 39.4  ?MCV 99.5  ?PLT 253  ? ?Basic Metabolic Panel: ?Recent Labs  ?Lab 06/14/21 ?0238  ?NA 142  ?K 3.5  ?CL 109  ?CO2 23  ?GLUCOSE 108*  ?BUN 9  ?CREATININE 0.85  ?CALCIUM 9.5  ? ?GFR: ?CrCl cannot be calculated (Unknown ideal weight.). ?Liver Function Tests: ?No results for input(s): AST, ALT, ALKPHOS, BILITOT, PROT, ALBUMIN in the last 168 hours. ?No results for input(s): LIPASE, AMYLASE in the last 168  hours. ?No results for input(s): AMMONIA in the last 168 hours. ?Coagulation Profile: ?No results for input(s): INR, PROTIME in the last 168 hours. ?Cardiac Enzymes: ?No results for input(s): CKTOTAL, CKMB, CKM

## 2021-06-14 NOTE — Assessment & Plan Note (Addendum)
Continue statin. 

## 2021-06-14 NOTE — Care Management CC44 (Signed)
Condition Code 44 Documentation Completed ? ?Patient Details  ?Name: Jason Wong ?MRN: [id number] ?Date of Birth: 1955-06-04 ? ? ?Condition Code 44 given:  Yes ?Patient signature on Condition Code 44 notice:  Yes ?Documentation of 2 MD's agreement:  Yes ?Code 44 added to claim:  Yes ? ? ? Laurena Slimmer, RN ?06/14/2021, 4:39 PM ? ?

## 2021-06-14 NOTE — ED Triage Notes (Signed)
Pt BIB EMS from home due to copd. Pt reports it started x2 days. Pt reports using inhaler and nebulized treatments. Per pt he still smokes.EMS gave '125mg'$  soulemdrol ,  Atrovent   ?

## 2021-06-14 NOTE — Assessment & Plan Note (Addendum)
-   As needed nicotine patch ordered for nicotine craving 

## 2021-06-14 NOTE — Assessment & Plan Note (Addendum)
Secondary to COPD exacerbation ?COVID/flu-negative ?We will check respiratory panel ?

## 2021-06-14 NOTE — Progress Notes (Signed)
?PROGRESS NOTE ? ? ? ?Jason Wong  XFG:[id number] DOB: 1955/08/28 DOA: 06/14/2021 ?PCP: Ladell Pier, MD  ? ?Brief Narrative:  ?66 year old with history of chronic tobacco use, COPD, HTN, HLD admitted to the hospital for acute shortness of breath.  This was started about 1-2 days prior to admission.  Upon admission diagnosed with COPD exacerbation started on aggressive bronchodilators, steroids and azithromycin. ? ? ?Assessment & Plan: ? Principal Problem: ?  Acute exacerbation of chronic obstructive pulmonary disease (COPD) ([place]) ?Active Problems: ?  Essential hypertension ?  Tobacco dependence ?  Acute respiratory failure with hypoxia due to COPD exacerbation ?  HLD (hyperlipidemia) ?  ? ? ?Assessment and Plan: ?* Acute exacerbation of chronic obstructive pulmonary disease (COPD) ([place]) ?Likely from tobacco use.  IV steroids, aggressive bronchodilators, empiric azithromycin ?Check procalcitonin ?BNP ? ?HLD (hyperlipidemia) ?Continue statin ?  ?   ? ?Acute respiratory failure with hypoxia due to COPD exacerbation ?Secondary to COPD exacerbation ?COVID/flu-negative ?We will check respiratory panel ? ?Tobacco dependence ?As needed nicotine patch ?  ? ?Essential hypertension ?Continue home Norvasc.  IV hydralazine as needed ?  ? ? ? ? ?  ? ? ? ?DVT prophylaxis: SCDs Start: 06/14/21 0543 ? ? ?Code Status: Full code ?Family Communication:   ? ?Significant respiratory distress requiring aggressive IV steroids and bronchodilator use ? ?Subjective: ?Admits of using cocaine.  Still feeling shortness of breath with minimal exertion. ? ? ? ?Examination: ? ?General exam: Appears calm and comfortable  ?Respiratory system: Diffuse bilateral diminished breath sounds. ?Cardiovascular system: S1 & S2 heard, RRR. No JVD, murmurs, rubs, gallops or clicks. No pedal edema. ?Gastrointestinal system: Abdomen is nondistended, soft and nontender. No organomegaly or masses felt. Normal bowel sounds heard. ?Central nervous system: Alert  and oriented. No focal neurological deficits. ?Extremities: Symmetric 5 x 5 power. ?Skin: No rashes, lesions or ulcers ?Psychiatry: Judgement and insight appear normal. Mood & affect appropriate.  ? ? ? ?Objective: ?Vitals:  ? 06/14/21 0545 06/14/21 0600 06/14/21 0615 06/14/21 0715  ?BP: 127/85 (!) 128/92 (!) 132/91 123/85  ?Pulse: 80 85 86 84  ?Resp:  '11 13 13  '$ ?Temp:      ?SpO2: 90% 94% 93% 96%  ? ? ?Intake/Output Summary (Last 24 hours) at 06/14/2021 0855 ?Last data filed at 06/14/2021 0719 ?Gross per 24 hour  ?Intake 1050 ml  ?Output 200 ml  ?Net 850 ml  ? ?There were no vitals filed for this visit. ? ? ?Data Reviewed:  ? ?CBC: ?Recent Labs  ?Lab 06/14/21 ?0238 06/14/21 ?0701 06/14/21 ?9678  ?WBC 9.5 7.2  --   ?NEUTROABS 4.1 6.6  --   ?HGB 13.7 12.4* 12.2*  ?HCT 39.4 37.5* 36.0*  ?MCV 99.5 101.6*  --   ?PLT 253 258  --   ? ?Basic Metabolic Panel: ?Recent Labs  ?Lab 06/14/21 ?0238 06/14/21 ?0701 06/14/21 ?9381  ?NA 142 142 142  ?K 3.5 3.2* 3.2*  ?CL 109 111  --   ?CO2 23 19*  --   ?GLUCOSE 108* 141*  --   ?BUN 9 10  --   ?CREATININE 0.85 0.81  --   ?CALCIUM 9.5 8.8*  --   ?MG  --  2.2  --   ?PHOS  --  2.9  --   ? ?GFR: ?CrCl cannot be calculated (Unknown ideal weight.). ?Liver Function Tests: ?Recent Labs  ?Lab 06/14/21 ?0701  ?AST 20  ?ALT 17  ?ALKPHOS 59  ?BILITOT 0.6  ?PROT 6.7  ?ALBUMIN 3.9  ? ?  No results for input(s): LIPASE, AMYLASE in the last 168 hours. ?No results for input(s): AMMONIA in the last 168 hours. ?Coagulation Profile: ?No results for input(s): INR, PROTIME in the last 168 hours. ?Cardiac Enzymes: ?No results for input(s): CKTOTAL, CKMB, CKMBINDEX, TROPONINI in the last 168 hours. ?BNP (last 3 results) ?No results for input(s): PROBNP in the last 8760 hours. ?HbA1C: ?No results for input(s): HGBA1C in the last 72 hours. ?CBG: ?No results for input(s): GLUCAP in the last 168 hours. ?Lipid Profile: ?No results for input(s): CHOL, HDL, LDLCALC, TRIG, CHOLHDL, LDLDIRECT in the last 72  hours. ?Thyroid Function Tests: ?No results for input(s): TSH, T4TOTAL, FREET4, T3FREE, THYROIDAB in the last 72 hours. ?Anemia Panel: ?No results for input(s): VITAMINB12, FOLATE, FERRITIN, TIBC, IRON, RETICCTPCT in the last 72 hours. ?Sepsis Labs: ?No results for input(s): PROCALCITON, LATICACIDVEN in the last 168 hours. ? ?Recent Results (from the past 240 hour(s))  ?Resp Panel by RT-PCR (Flu A&B, Covid) Nasopharyngeal Swab     Status: None  ? Collection Time: 06/14/21  2:34 AM  ? Specimen: Nasopharyngeal Swab; Nasopharyngeal(NP) swabs in vial transport medium  ?Result Value Ref Range Status  ? SARS Coronavirus 2 by RT PCR NEGATIVE NEGATIVE Final  ?  Comment: (NOTE) ?SARS-CoV-2 target nucleic acids are NOT DETECTED. ? ?The SARS-CoV-2 RNA is generally detectable in upper respiratory ?specimens during the acute phase of infection. The lowest ?concentration of SARS-CoV-2 viral copies this assay can detect is ?138 copies/mL. A negative result does not preclude SARS-Cov-2 ?infection and should not be used as the sole basis for treatment or ?other patient management decisions. A negative result may occur with  ?improper specimen collection/handling, submission of specimen other ?than nasopharyngeal swab, presence of viral mutation(s) within the ?areas targeted by this assay, and inadequate number of viral ?copies(<138 copies/mL). A negative result must be combined with ?clinical observations, patient history, and epidemiological ?information. The expected result is Negative. ? ?Fact Sheet for Patients:  ?EntrepreneurPulse.com.au ? ?Fact Sheet for Healthcare Providers:  ?IncredibleEmployment.be ? ?This test is no t yet approved or cleared by the [place] FDA and  ?has been authorized for detection and/or diagnosis of SARS-CoV-2 by ?FDA under an Emergency Use Authorization (EUA). This EUA will remain  ?in effect (meaning this test can be used) for the duration of the ?COVID-19  declaration under Section 564(b)(1) of the Act, 21 ?U.S.C.section 360bbb-3(b)(1), unless the authorization is terminated  ?or revoked sooner.  ? ? ?  ? Influenza A by PCR NEGATIVE NEGATIVE Final  ? Influenza B by PCR NEGATIVE NEGATIVE Final  ?  Comment: (NOTE) ?The Xpert Xpress SARS-CoV-2/FLU/RSV plus assay is intended as an aid ?in the diagnosis of influenza from Nasopharyngeal swab specimens and ?should not be used as a sole basis for treatment. Nasal washings and ?aspirates are unacceptable for Xpert Xpress SARS-CoV-2/FLU/RSV ?testing. ? ?Fact Sheet for Patients: ?EntrepreneurPulse.com.au ? ?Fact Sheet for Healthcare Providers: ?IncredibleEmployment.be ? ?This test is not yet approved or cleared by the [place] FDA and ?has been authorized for detection and/or diagnosis of SARS-CoV-2 by ?FDA under an Emergency Use Authorization (EUA). This EUA will remain ?in effect (meaning this test can be used) for the duration of the ?COVID-19 declaration under Section 564(b)(1) of the Act, 21 U.S.C. ?section 360bbb-3(b)(1), unless the authorization is terminated or ?revoked. ? ?Performed at [facility], [place] [address], [place], [place] ?[address] ?  ?  ? ? ? ? ? ?Radiology Studies: ?DG Chest Port 1 View ? ?  Result Date: 06/14/2021 ?CLINICAL DATA:  Dyspnea and coughing. EXAM: PORTABLE CHEST 1 VIEW COMPARISON:  PA Lat 05/07/2021. FINDINGS: Heart size and vasculature are normal aside from aortic atherosclerosis. The mediastinum is stable. The lungs are mildly emphysematous but clear. No pleural effusion is seen. No acute osseous findings with mild osteopenia. IMPRESSION: No active disease.  Stable COPD chest.  Aortic atherosclerosis. Electronically Signed   By: Telford Nab M.D.   On: 06/14/2021 02:56  ? ? ? ? ? ? ?Scheduled Meds: ? amLODipine  5 mg Oral Daily  ? atorvastatin  10 mg Oral Daily  ? ipratropium-albuterol  3 mL Nebulization Q6H  ? methylPREDNISolone (SOLU-MEDROL)  injection  80 mg Intravenous Q12H  ? nicotine  14 mg Transdermal Daily  ? ?Continuous Infusions: ? azithromycin Stopped (06/14/21 0752)  ? ? ? LOS: 0 days  ? ?Time spent= 35 mins ? ? ? ?Collie Wernick Arsenio Loader, MD ?[facility]

## 2021-06-14 NOTE — Assessment & Plan Note (Addendum)
Likely from tobacco use.  IV steroids, aggressive bronchodilators, empiric azithromycin ?Check procalcitonin ?BNP ?

## 2021-06-15 DIAGNOSIS — Z79899 Other long term (current) drug therapy: Secondary | ICD-10-CM | POA: Diagnosis not present

## 2021-06-15 DIAGNOSIS — E440 Moderate protein-calorie malnutrition: Secondary | ICD-10-CM | POA: Diagnosis not present

## 2021-06-15 DIAGNOSIS — T502X5A Adverse effect of carbonic-anhydrase inhibitors, benzothiadiazides and other diuretics, initial encounter: Secondary | ICD-10-CM | POA: Diagnosis not present

## 2021-06-15 DIAGNOSIS — F14188 Cocaine abuse with other cocaine-induced disorder: Secondary | ICD-10-CM | POA: Diagnosis not present

## 2021-06-15 DIAGNOSIS — E876 Hypokalemia: Secondary | ICD-10-CM | POA: Diagnosis not present

## 2021-06-15 DIAGNOSIS — Z7952 Long term (current) use of systemic steroids: Secondary | ICD-10-CM | POA: Diagnosis not present

## 2021-06-15 DIAGNOSIS — I10 Essential (primary) hypertension: Secondary | ICD-10-CM | POA: Diagnosis not present

## 2021-06-15 DIAGNOSIS — E785 Hyperlipidemia, unspecified: Secondary | ICD-10-CM | POA: Diagnosis not present

## 2021-06-15 DIAGNOSIS — F17210 Nicotine dependence, cigarettes, uncomplicated: Secondary | ICD-10-CM | POA: Diagnosis not present

## 2021-06-15 DIAGNOSIS — R0603 Acute respiratory distress: Secondary | ICD-10-CM | POA: Diagnosis present

## 2021-06-15 DIAGNOSIS — Z8249 Family history of ischemic heart disease and other diseases of the circulatory system: Secondary | ICD-10-CM | POA: Diagnosis not present

## 2021-06-15 DIAGNOSIS — Z7951 Long term (current) use of inhaled steroids: Secondary | ICD-10-CM | POA: Diagnosis not present

## 2021-06-15 DIAGNOSIS — Z681 Body mass index (BMI) 19 or less, adult: Secondary | ICD-10-CM | POA: Diagnosis not present

## 2021-06-15 DIAGNOSIS — J441 Chronic obstructive pulmonary disease with (acute) exacerbation: Secondary | ICD-10-CM | POA: Diagnosis not present

## 2021-06-15 DIAGNOSIS — J9601 Acute respiratory failure with hypoxia: Secondary | ICD-10-CM | POA: Diagnosis not present

## 2021-06-15 DIAGNOSIS — Z800 Family history of malignant neoplasm of digestive organs: Secondary | ICD-10-CM | POA: Diagnosis not present

## 2021-06-15 DIAGNOSIS — Z20822 Contact with and (suspected) exposure to covid-19: Secondary | ICD-10-CM | POA: Diagnosis not present

## 2021-06-15 DIAGNOSIS — F17200 Nicotine dependence, unspecified, uncomplicated: Secondary | ICD-10-CM | POA: Diagnosis not present

## 2021-06-15 LAB — BASIC METABOLIC PANEL
Anion gap: 8 (ref 5–15)
BUN: 16 mg/dL (ref 8–23)
CO2: 22 mmol/L (ref 22–32)
Calcium: 9.5 mg/dL (ref 8.9–10.3)
Chloride: 110 mmol/L (ref 98–111)
Creatinine, Ser: 0.92 mg/dL (ref 0.61–1.24)
GFR, Estimated: >60 mL/min (ref >60)
Glucose, Bld: 129 mg/dL — ABNORMAL HIGH (ref 70–99)
Potassium: 3.7 mmol/L (ref 3.5–5.1)
Sodium: 140 mmol/L (ref 135–145)

## 2021-06-15 LAB — MAGNESIUM: Magnesium: 2 mg/dL (ref 1.7–2.4)

## 2021-06-15 MED ORDER — AZITHROMYCIN 500 MG PO TABS
250.0000 mg | ORAL_TABLET | Freq: Every day | ORAL | Status: AC
  Administered 2021-06-16 – 2021-06-18 (×3): 250 mg via ORAL
  Filled 2021-06-15 (×3): qty 1

## 2021-06-15 MED ORDER — ADULT MULTIVITAMIN W/MINERALS CH
1.0000 | ORAL_TABLET | Freq: Every day | ORAL | Status: DC
  Administered 2021-06-15 – 2021-06-21 (×7): 1 via ORAL
  Filled 2021-06-15 (×7): qty 1

## 2021-06-15 MED ORDER — IPRATROPIUM-ALBUTEROL 0.5-2.5 (3) MG/3ML IN SOLN
3.0000 mL | Freq: Four times a day (QID) | RESPIRATORY_TRACT | Status: DC
  Administered 2021-06-15 – 2021-06-16 (×8): 3 mL via RESPIRATORY_TRACT
  Filled 2021-06-15 (×8): qty 3

## 2021-06-15 NOTE — Progress Notes (Signed)
Initial Nutrition Assessment ? ?DOCUMENTATION CODES:  ? ?Non-severe (moderate) malnutrition in context of chronic illness ? ?INTERVENTION:  ? ?Multivitamin w/ minerals daily ?Ensure Enlive po BID, each supplement provides 350 kcal and 20 grams of protein. ?Meal ordering with assist ? ?NUTRITION DIAGNOSIS:  ? ?Moderate Malnutrition related to chronic illness (COPD) as evidenced by moderate fat depletion, moderate muscle depletion. ? ?GOAL:  ? ?Patient will meet greater than or equal to 90% of their needs ? ?MONITOR:  ? ?PO intake, Supplement acceptance, Weight trends ? ?REASON FOR ASSESSMENT:  ? ?Malnutrition Screening Tool ?  ? ?ASSESSMENT:  ? ?66 y.o. male presented to the ED with shortness of breath. PMH includes COPD, HTN, EtOH abuse, and Hepatitis C. Pt admitted with COPD exacerbation.  ? ?Pt laying in bed at time of RD visit. ? ?Pt reports that his appetite has been on and off. Reports that he eats items such as; eggs, grits, pizza, chicken, and sandwiches. Pt denies any difficulty chewing or swallowing.  ? ?Pt reports that his UBW is 135/136# and now he is 120#. Reports that his weight loss started just prior to the last time that he was here. Per EMR, pt has not had any weight loss. Reports that he does not use any assistance with ambulating.  ? ?Pt stated that he just wants house trays and that he is not picky and will just eat whatever is being fixed. Discussed ONS with pt; pt agreeable to ONS.  ? ?Medications reviewed and include: Solu-Medrol, IV antibiotics ?Labs reviewed. ? ?NUTRITION - FOCUSED PHYSICAL EXAM: ? ?Flowsheet Row Most Recent Value  ?Orbital Region Moderate depletion  ?Upper Arm Region Moderate depletion  ?Thoracic and Lumbar Region Moderate depletion  ?Buccal Region Moderate depletion  ?Temple Region Moderate depletion  ?Clavicle Bone Region Moderate depletion  ?Clavicle and Acromion Bone Region Moderate depletion  ?Scapular Bone Region Moderate depletion  ?Dorsal Hand Mild depletion   ?Patellar Region Mild depletion  ?Anterior Thigh Region Mild depletion  ?Posterior Calf Region Mild depletion  ?Edema (RD Assessment) None  ?Hair Reviewed  ?Eyes Reviewed  ?Mouth Reviewed  ?Skin Reviewed  ?Nails Reviewed  ? ?Diet Order:   ?Diet Order   ? ?       ?  Diet regular Room service appropriate? Yes; Fluid consistency: Thin  Diet effective now       ?  ? ?  ?  ? ?  ? ? ?EDUCATION NEEDS:  ? ?No education needs have been identified at this time ? ?Skin:  Skin Assessment: Reviewed RN Assessment ? ?Last BM:  3/27 ? ?Height:  ? ?Ht Readings from Last 1 Encounters:  ?06/14/21 '5\' 9"'$  (1.753 m)  ? ? ?Weight:  ? ?Wt Readings from Last 1 Encounters:  ?06/15/21 57.4 kg  ? ? ?Ideal Body Weight:  72.7 kg ? ?BMI:  Body mass index is 18.67 kg/m?. ? ?Estimated Nutritional Needs:  ? ?Kcal:  1700-1900 ? ?Protein:  85-100 grams ? ?Fluid:  >/= 1.7 L ? ? ? ?Hermina Barters RD, LDN ?Clinical Dietitian ?See AMiON for contact information.  ? ?

## 2021-06-15 NOTE — Care Management (Signed)
?  Transition of Care (TOC) Screening Note ? ? ?Patient Details  ?Name: Jason Wong ?Date of Birth: 10/15/1955 ? ? ?Transition of Care (TOC) CM/SW Contact:    ?Carles Collet, RN ?Phone Number: ?06/15/2021, 7:57 AM ? ? ? ?Transition of Care Department [facility]) has reviewed patient and we will continue to monitor patient advancement through interdisciplinary progression rounds.   ?

## 2021-06-15 NOTE — Progress Notes (Signed)
?PROGRESS NOTE ? ? ? ?Jason Wong  CBJ:[id number] DOB: 10-21-1955 DOA: 06/14/2021 ?PCP: Ladell Pier, MD  ? ?Brief Narrative:  ?66 year old with history of chronic tobacco use, COPD, HTN, HLD admitted to the hospital for acute shortness of breath.  This was started about 1-2 days prior to admission.  Upon admission diagnosed with COPD exacerbation started on aggressive bronchodilators, steroids and azithromycin. ? ? ?Assessment & Plan: ? Principal Problem: ?  Acute exacerbation of chronic obstructive pulmonary disease (COPD) ([place]) ?Active Problems: ?  Essential hypertension ?  Tobacco dependence ?  Acute respiratory failure with hypoxia due to COPD exacerbation ?  HLD (hyperlipidemia) ?  ? ? ?Assessment and Plan: ?* Acute exacerbation of chronic obstructive pulmonary disease (COPD) ([place]) ?Likely from tobacco use.  IV steroids, aggressive bronchodilators, empiric azithromycin ?ProCal and BNP Neg ?Still has exertional shortness of breath with abnormal breath sounds ? ?HLD (hyperlipidemia) ?Continue statin ?  ?   ? ?Acute respiratory failure with hypoxia due to COPD exacerbation ?Secondary to COPD exacerbation ?COVID/flu-negative ? ?Tobacco dependence ?As needed nicotine patch ?  ? ?Essential hypertension ?Continue home Norvasc.  IV hydralazine as needed ?  ? ? ? ? ?  ? ? ? ?DVT prophylaxis: SCDs Start: 06/14/21 0543 ? ? ?Code Status: Full code ?Family Communication:   ? ?Significant respiratory distress requiring aggressive IV steroids and bronchodilator use ? ?Subjective: ?Continues to have exertional dyspnea.  Minimal improvement in last 24 hours. ? ?Examination: ? ?General exam: Appears calm and comfortable  ?Respiratory system: Diffuse bilateral diminished breath sounds ?Cardiovascular system: S1 & S2 heard, RRR. No JVD, murmurs, rubs, gallops or clicks. No pedal edema. ?Gastrointestinal system: Abdomen is nondistended, soft and nontender. No organomegaly or masses felt. Normal bowel sounds heard. ?Central  nervous system: Alert and oriented. No focal neurological deficits. ?Extremities: Symmetric 5 x 5 power. ?Skin: No rashes, lesions or ulcers ?Psychiatry: Judgement and insight appear normal. Mood & affect appropriate.  ? ? ? ?Objective: ?Vitals:  ? 06/15/21 0500 06/15/21 0513 06/15/21 0717 06/15/21 0733  ?BP:  (!) 142/97 (!) 182/112   ?Pulse: 69 68 (!) 101 73  ?Resp: '14 13 20   '$ ?Temp:  98 ?F (36.7 ?C) 97.7 ?F (36.5 ?C)   ?TempSrc:  Oral Oral   ?SpO2: 93% 93% 94% 99%  ?Weight: 57.4 kg     ?Height:      ? ? ?Intake/Output Summary (Last 24 hours) at 06/15/2021 0831 ?Last data filed at 06/14/2021 1744 ?Gross per 24 hour  ?Intake 250 ml  ?Output --  ?Net 250 ml  ? ?Filed Weights  ? 06/14/21 1743 06/15/21 0500  ?Weight: 57.4 kg 57.4 kg  ? ? ? ?Data Reviewed:  ? ?CBC: ?Recent Labs  ?Lab 06/14/21 ?0238 06/14/21 ?0701 06/14/21 ?1607  ?WBC 9.5 7.2  --   ?NEUTROABS 4.1 6.6  --   ?HGB 13.7 12.4* 12.2*  ?HCT 39.4 37.5* 36.0*  ?MCV 99.5 101.6*  --   ?PLT 253 258  --   ? ?Basic Metabolic Panel: ?Recent Labs  ?Lab 06/14/21 ?0238 06/14/21 ?0701 06/14/21 ?3710 06/15/21 ?0143  ?NA 142 142 142 140  ?K 3.5 3.2* 3.2* 3.7  ?CL 109 111  --  110  ?CO2 23 19*  --  22  ?GLUCOSE 108* 141*  --  129*  ?BUN 9 10  --  16  ?CREATININE 0.85 0.81  --  0.92  ?CALCIUM 9.5 8.8*  --  9.5  ?MG  --  2.2  --  2.0  ?  PHOS  --  2.9  --   --   ? ?GFR: ?Estimated Creatinine Clearance: 64.1 mL/min (by C-G formula based on SCr of 0.92 mg/dL). ?Liver Function Tests: ?Recent Labs  ?Lab 06/14/21 ?0701  ?AST 20  ?ALT 17  ?ALKPHOS 59  ?BILITOT 0.6  ?PROT 6.7  ?ALBUMIN 3.9  ? ?No results for input(s): LIPASE, AMYLASE in the last 168 hours. ?No results for input(s): AMMONIA in the last 168 hours. ?Coagulation Profile: ?No results for input(s): INR, PROTIME in the last 168 hours. ?Cardiac Enzymes: ?No results for input(s): CKTOTAL, CKMB, CKMBINDEX, TROPONINI in the last 168 hours. ?BNP (last 3 results) ?No results for input(s): PROBNP in the last 8760 hours. ?HbA1C: ?No  results for input(s): HGBA1C in the last 72 hours. ?CBG: ?No results for input(s): GLUCAP in the last 168 hours. ?Lipid Profile: ?No results for input(s): CHOL, HDL, LDLCALC, TRIG, CHOLHDL, LDLDIRECT in the last 72 hours. ?Thyroid Function Tests: ?No results for input(s): TSH, T4TOTAL, FREET4, T3FREE, THYROIDAB in the last 72 hours. ?Anemia Panel: ?No results for input(s): VITAMINB12, FOLATE, FERRITIN, TIBC, IRON, RETICCTPCT in the last 72 hours. ?Sepsis Labs: ?Recent Labs  ?Lab 06/14/21 ?0701  ?PROCALCITON <0.10  ? ? ?Recent Results (from the past 240 hour(s))  ?Resp Panel by RT-PCR (Flu A&B, Covid) Nasopharyngeal Swab     Status: None  ? Collection Time: 06/14/21  2:34 AM  ? Specimen: Nasopharyngeal Swab; Nasopharyngeal(NP) swabs in vial transport medium  ?Result Value Ref Range Status  ? SARS Coronavirus 2 by RT PCR NEGATIVE NEGATIVE Final  ?  Comment: (NOTE) ?SARS-CoV-2 target nucleic acids are NOT DETECTED. ? ?The SARS-CoV-2 RNA is generally detectable in upper respiratory ?specimens during the acute phase of infection. The lowest ?concentration of SARS-CoV-2 viral copies this assay can detect is ?138 copies/mL. A negative result does not preclude SARS-Cov-2 ?infection and should not be used as the sole basis for treatment or ?other patient management decisions. A negative result may occur with  ?improper specimen collection/handling, submission of specimen other ?than nasopharyngeal swab, presence of viral mutation(s) within the ?areas targeted by this assay, and inadequate number of viral ?copies(<138 copies/mL). A negative result must be combined with ?clinical observations, patient history, and epidemiological ?information. The expected result is Negative. ? ?Fact Sheet for Patients:  ?EntrepreneurPulse.com.au ? ?Fact Sheet for Healthcare Providers:  ?IncredibleEmployment.be ? ?This test is no t yet approved or cleared by the [place] FDA and  ?has been authorized for  detection and/or diagnosis of SARS-CoV-2 by ?FDA under an Emergency Use Authorization (EUA). This EUA will remain  ?in effect (meaning this test can be used) for the duration of the ?COVID-19 declaration under Section 564(b)(1) of the Act, 21 ?U.S.C.section 360bbb-3(b)(1), unless the authorization is terminated  ?or revoked sooner.  ? ? ?  ? Influenza A by PCR NEGATIVE NEGATIVE Final  ? Influenza B by PCR NEGATIVE NEGATIVE Final  ?  Comment: (NOTE) ?The Xpert Xpress SARS-CoV-2/FLU/RSV plus assay is intended as an aid ?in the diagnosis of influenza from Nasopharyngeal swab specimens and ?should not be used as a sole basis for treatment. Nasal washings and ?aspirates are unacceptable for Xpert Xpress SARS-CoV-2/FLU/RSV ?testing. ? ?Fact Sheet for Patients: ?EntrepreneurPulse.com.au ? ?Fact Sheet for Healthcare Providers: ?IncredibleEmployment.be ? ?This test is not yet approved or cleared by the [place] FDA and ?has been authorized for detection and/or diagnosis of SARS-CoV-2 by ?FDA under an Emergency Use Authorization (EUA). This EUA will remain ?in effect (meaning this test can  be used) for the duration of the ?COVID-19 declaration under Section 564(b)(1) of the Act, 21 U.S.C. ?section 360bbb-3(b)(1), unless the authorization is terminated or ?revoked. ? ?Performed at [facility], [place] [address], [place], [place] ?[address] ?  ?  ? ? ? ? ? ?Radiology Studies: ?DG Chest Port 1 View ? ?Result Date: 06/14/2021 ?CLINICAL DATA:  Dyspnea and coughing. EXAM: PORTABLE CHEST 1 VIEW COMPARISON:  PA Lat 05/07/2021. FINDINGS: Heart size and vasculature are normal aside from aortic atherosclerosis. The mediastinum is stable. The lungs are mildly emphysematous but clear. No pleural effusion is seen. No acute osseous findings with mild osteopenia. IMPRESSION: No active disease.  Stable COPD chest.  Aortic atherosclerosis. Electronically Signed   By: Telford Nab M.D.   On:  06/14/2021 02:56   ? ? ? ? ? ? ?Scheduled Meds: ? amLODipine  5 mg Oral Daily  ? atorvastatin  10 mg Oral Daily  ? budesonide (PULMICORT) nebulizer solution  0.5 mg Nebulization BID  ? [id number]

## 2021-06-16 DIAGNOSIS — I10 Essential (primary) hypertension: Secondary | ICD-10-CM | POA: Diagnosis not present

## 2021-06-16 DIAGNOSIS — E785 Hyperlipidemia, unspecified: Secondary | ICD-10-CM | POA: Diagnosis not present

## 2021-06-16 DIAGNOSIS — J9601 Acute respiratory failure with hypoxia: Secondary | ICD-10-CM | POA: Diagnosis not present

## 2021-06-16 DIAGNOSIS — J441 Chronic obstructive pulmonary disease with (acute) exacerbation: Secondary | ICD-10-CM | POA: Diagnosis not present

## 2021-06-16 DIAGNOSIS — E440 Moderate protein-calorie malnutrition: Secondary | ICD-10-CM | POA: Insufficient documentation

## 2021-06-16 LAB — BASIC METABOLIC PANEL
Anion gap: 10 (ref 5–15)
BUN: 15 mg/dL (ref 8–23)
CO2: 25 mmol/L (ref 22–32)
Calcium: 9.6 mg/dL (ref 8.9–10.3)
Chloride: 104 mmol/L (ref 98–111)
Creatinine, Ser: 0.91 mg/dL (ref 0.61–1.24)
GFR, Estimated: >60 mL/min (ref >60)
Glucose, Bld: 138 mg/dL — ABNORMAL HIGH (ref 70–99)
Potassium: 3.9 mmol/L (ref 3.5–5.1)
Sodium: 139 mmol/L (ref 135–145)

## 2021-06-16 LAB — MAGNESIUM: Magnesium: 1.9 mg/dL (ref 1.7–2.4)

## 2021-06-16 MED ORDER — DM-GUAIFENESIN ER 30-600 MG PO TB12
2.0000 | ORAL_TABLET | Freq: Two times a day (BID) | ORAL | Status: DC
  Administered 2021-06-16 – 2021-06-21 (×10): 2 via ORAL
  Filled 2021-06-16 (×7): qty 2
  Filled 2021-06-16: qty 1
  Filled 2021-06-16 (×2): qty 2
  Filled 2021-06-16: qty 1

## 2021-06-16 NOTE — Progress Notes (Signed)
?PROGRESS NOTE ? ? ? ?Jason Wong  ZWC:[id number] DOB: 01/26/1956 DOA: 06/14/2021 ?PCP: Ladell Pier, MD  ? ? ?Chief Complaint  ?Patient presents with  ? Shortness of Breath  ? ? ?Brief Narrative:  ?66 year old with history of chronic tobacco use, COPD, HTN, HLD admitted to the hospital for acute shortness of breath.  This was started about 1-2 days prior to admission.  Upon admission diagnosed with COPD exacerbation started on aggressive bronchodilators, steroids and azithromycin. ? ? ?Assessment & Plan: ?  ?Principal Problem: ?  Acute exacerbation of chronic obstructive pulmonary disease (COPD) ([place]) ?Active Problems: ?  Essential hypertension ?  Tobacco dependence ?  COPD exacerbation ([place]) ?  Acute respiratory failure with hypoxia due to COPD exacerbation ?  HLD (hyperlipidemia) ?  Malnutrition of moderate degree ? ?Acute exacerbation of chronic obstructive pulmonary disease (COPD) ([place]) ?Likely from ongoing tobacco use.  ?BNP negative, procalcitonin negative.  ?-Patient improving slowly still with some exertional shortness of breath and abnormal breath sounds.  ?-Chest tightness improving.  ?-Continue Pulmicort, scheduled DuoNebs, azithromycin. ?-Increase Mucinex to 1200 mg twice daily.  ?-Continue IV Solu-Medrol through today and taper down to 60 mg IV daily starting tomorrow.  ?Check ambulatory sats tomorrow morning.  ?Supportive care. ?  ?HLD (hyperlipidemia) ?Statin.  ?  ?   ?  ?Acute respiratory failure with hypoxia due to COPD exacerbation ?Secondary to COPD exacerbation ?COVID/flu-negative ?See above ?  ?Tobacco dependence ?Tobacco cessation stressed to patient. ?Nicotine patch. ?  ?  ?Essential hypertension ?Currently controlled on current regimen of Norvasc.   ?Hydralazine as needed. ? ? ?DVT prophylaxis: SCDs ?Code Status: Full ?Family Communication: Updated patient.  No family at bedside. ?Disposition:  ? ?Status is: Inpatient ?Remains inpatient appropriate because: Severity of illness ?   ?Consultants:  ?None ? ?Procedures: ?Chest x-ray 06/14/2021 ? ? ?Antimicrobials:  ?IV azithromycin 06/14/2021 >>> 06/15/2021 ? oral azithromycin 06/16/2021>>>> 06/19/2021 ? ? ?Subjective: ?Laying in bed. States still with SOB on extertion. No CP.  Overall feeling better than on presentation. ? ?Objective: ?Vitals:  ? 06/16/21 0400 06/16/21 0726 06/16/21 0753 06/16/21 1056  ?BP: 107/85 126/90  (!) 130/96  ?Pulse: 76 78 86 98  ?Resp: '19 15  19  '$ ?Temp: 97.9 ?F (36.6 ?C)  98 ?F (36.7 ?C) 97.9 ?F (36.6 ?C)  ?TempSrc: Oral  Oral Oral  ?SpO2: 100% 100%  96%  ?Weight:      ?Height:      ? ? ?Intake/Output Summary (Last 24 hours) at 06/16/2021 1304 ?Last data filed at 06/16/2021 2353 ?Gross per 24 hour  ?Intake 430 ml  ?Output 1750 ml  ?Net -1320 ml  ? ?Filed Weights  ? 06/14/21 1743 06/15/21 0500  ?Weight: 57.4 kg 57.4 kg  ? ? ?Examination: ? ?General exam: Appears calm and comfortable  ?Respiratory system: Less tight. Scattered coarse BS.  Minimal expiratory wheezing.  Fair air movement.  Speaking in full sentences. ?Cardiovascular system: S1 & S2 heard, RRR. No JVD, murmurs, rubs, gallops or clicks. No pedal edema. ?Gastrointestinal system: Abdomen is nondistended, soft and nontender. No organomegaly or masses felt. Normal bowel sounds heard. ?Central nervous system: Alert and oriented. No focal neurological deficits. ?Extremities: Symmetric 5 x 5 power. ?Skin: No rashes, lesions or ulcers ?Psychiatry: Judgement and insight appear normal. Mood & affect appropriate.  ? ? ? ?Data Reviewed: I have personally reviewed following labs and imaging studies ? ?CBC: ?Recent Labs  ?Lab 06/14/21 ?0238 06/14/21 ?0701 06/14/21 ?6144  ?WBC 9.5 7.2  --   ?  NEUTROABS 4.1 6.6  --   ?HGB 13.7 12.4* 12.2*  ?HCT 39.4 37.5* 36.0*  ?MCV 99.5 101.6*  --   ?PLT 253 258  --   ? ? ?Basic Metabolic Panel: ?Recent Labs  ?Lab 06/14/21 ?7341 06/14/21 ?0701 06/14/21 ?9379 06/15/21 ?0143 06/16/21 ?0240  ?NA 142 142 142 140 139  ?K 3.5 3.2* 3.2* 3.7 3.9  ?CL 109  111  --  110 104  ?CO2 23 19*  --  22 25  ?GLUCOSE 108* 141*  --  129* 138*  ?BUN 9 10  --  16 15  ?CREATININE 0.85 0.81  --  0.92 0.91  ?CALCIUM 9.5 8.8*  --  9.5 9.6  ?MG  --  2.2  --  2.0 1.9  ?PHOS  --  2.9  --   --   --   ? ? ?GFR: ?Estimated Creatinine Clearance: 64.8 mL/min (by C-G formula based on SCr of 0.91 mg/dL). ? ?Liver Function Tests: ?Recent Labs  ?Lab 06/14/21 ?0701  ?AST 20  ?ALT 17  ?ALKPHOS 59  ?BILITOT 0.6  ?PROT 6.7  ?ALBUMIN 3.9  ? ? ?CBG: ?No results for input(s): GLUCAP in the last 168 hours. ? ? ?Recent Results (from the past 240 hour(s))  ?Resp Panel by RT-PCR (Flu A&B, Covid) Nasopharyngeal Swab     Status: None  ? Collection Time: 06/14/21  2:34 AM  ? Specimen: Nasopharyngeal Swab; Nasopharyngeal(NP) swabs in vial transport medium  ?Result Value Ref Range Status  ? SARS Coronavirus 2 by RT PCR NEGATIVE NEGATIVE Final  ?  Comment: (NOTE) ?SARS-CoV-2 target nucleic acids are NOT DETECTED. ? ?The SARS-CoV-2 RNA is generally detectable in upper respiratory ?specimens during the acute phase of infection. The lowest ?concentration of SARS-CoV-2 viral copies this assay can detect is ?138 copies/mL. A negative result does not preclude SARS-Cov-2 ?infection and should not be used as the sole basis for treatment or ?other patient management decisions. A negative result may occur with  ?improper specimen collection/handling, submission of specimen other ?than nasopharyngeal swab, presence of viral mutation(s) within the ?areas targeted by this assay, and inadequate number of viral ?copies(<138 copies/mL). A negative result must be combined with ?clinical observations, patient history, and epidemiological ?information. The expected result is Negative. ? ?Fact Sheet for Patients:  ?EntrepreneurPulse.com.au ? ?Fact Sheet for Healthcare Providers:  ?IncredibleEmployment.be ? ?This test is no t yet approved or cleared by the [place] FDA and  ?has been authorized  for detection and/or diagnosis of SARS-CoV-2 by ?FDA under an Emergency Use Authorization (EUA). This EUA will remain  ?in effect (meaning this test can be used) for the duration of the ?COVID-19 declaration under Section 564(b)(1) of the Act, 21 ?U.S.C.section 360bbb-3(b)(1), unless the authorization is terminated  ?or revoked sooner.  ? ? ?  ? Influenza A by PCR NEGATIVE NEGATIVE Final  ? Influenza B by PCR NEGATIVE NEGATIVE Final  ?  Comment: (NOTE) ?The Xpert Xpress SARS-CoV-2/FLU/RSV plus assay is intended as an aid ?in the diagnosis of influenza from Nasopharyngeal swab specimens and ?should not be used as a sole basis for treatment. Nasal washings and ?aspirates are unacceptable for Xpert Xpress SARS-CoV-2/FLU/RSV ?testing. ? ?Fact Sheet for Patients: ?EntrepreneurPulse.com.au ? ?Fact Sheet for Healthcare Providers: ?IncredibleEmployment.be ? ?This test is not yet approved or cleared by the [place] FDA and ?has been authorized for detection and/or diagnosis of SARS-CoV-2 by ?FDA under an Emergency Use Authorization (EUA). This EUA will remain ?in effect (meaning this test can be  used) for the duration of the ?COVID-19 declaration under Section 564(b)(1) of the Act, 21 U.S.C. ?section 360bbb-3(b)(1), unless the authorization is terminated or ?revoked. ? ?Performed at [facility], [place] [address], [place], [place] ?[address] ?  ?  ? ? ? ? ? ?Radiology Studies: ?No results found. ? ? ? ? ? ?Scheduled Meds: ? amLODipine  5 mg Oral Daily  ? atorvastatin  10 mg Oral Daily  ? azithromycin  250 mg Oral Daily  ? budesonide (PULMICORT) nebulizer solution  0.5 mg Nebulization BID  ? dextromethorphan-guaiFENesin  2 tablet Oral BID  ? feeding supplement  237 mL Oral BID BM  ? ipratropium-albuterol  3 mL Nebulization QID  ? methylPREDNISolone (SOLU-MEDROL) injection  120 mg Intravenous Q24H  ? multivitamin with minerals  1 tablet Oral Daily  ? nicotine  14 mg Transdermal Daily   ? ?Continuous Infusions: ? ? LOS: 2 days  ? ? ?Time spent: 35 minutes ? ? ? ?Irine Seal, MD ?[facility] ? ? ?To contact the attending provider between 7A-7P or the covering provider during aft

## 2021-06-17 DIAGNOSIS — J9601 Acute respiratory failure with hypoxia: Secondary | ICD-10-CM | POA: Diagnosis not present

## 2021-06-17 DIAGNOSIS — J441 Chronic obstructive pulmonary disease with (acute) exacerbation: Secondary | ICD-10-CM | POA: Diagnosis not present

## 2021-06-17 DIAGNOSIS — I10 Essential (primary) hypertension: Secondary | ICD-10-CM | POA: Diagnosis not present

## 2021-06-17 DIAGNOSIS — E785 Hyperlipidemia, unspecified: Secondary | ICD-10-CM | POA: Diagnosis not present

## 2021-06-17 LAB — BASIC METABOLIC PANEL
Anion gap: 11 (ref 5–15)
BUN: 20 mg/dL (ref 8–23)
CO2: 23 mmol/L (ref 22–32)
Calcium: 9.4 mg/dL (ref 8.9–10.3)
Chloride: 106 mmol/L (ref 98–111)
Creatinine, Ser: 0.91 mg/dL (ref 0.61–1.24)
GFR, Estimated: >60 mL/min (ref >60)
Glucose, Bld: 140 mg/dL — ABNORMAL HIGH (ref 70–99)
Potassium: 4.1 mmol/L (ref 3.5–5.1)
Sodium: 140 mmol/L (ref 135–145)

## 2021-06-17 LAB — MAGNESIUM: Magnesium: 1.9 mg/dL (ref 1.7–2.4)

## 2021-06-17 MED ORDER — METHYLPREDNISOLONE SODIUM SUCC 125 MG IJ SOLR
60.0000 mg | INTRAMUSCULAR | Status: DC
Start: 2021-06-17 — End: 2021-06-18
  Administered 2021-06-17: 60 mg via INTRAVENOUS
  Filled 2021-06-17: qty 2

## 2021-06-17 MED ORDER — IPRATROPIUM-ALBUTEROL 0.5-2.5 (3) MG/3ML IN SOLN
3.0000 mL | Freq: Three times a day (TID) | RESPIRATORY_TRACT | Status: DC
  Administered 2021-06-17 – 2021-06-20 (×10): 3 mL via RESPIRATORY_TRACT
  Filled 2021-06-17: qty 42
  Filled 2021-06-17 (×9): qty 3

## 2021-06-17 NOTE — Progress Notes (Signed)
?PROGRESS NOTE ? ? ? ?Jason Wong  WLN:[id number] DOB: [birth year] DOA: 06/14/2021 ?PCP: Ladell Pier, MD  ? ? ?Chief Complaint  ?Patient presents with  ? Shortness of Breath  ? ? ?Brief Narrative:  ?66 year old with history of chronic tobacco use, COPD, HTN, HLD admitted to the hospital for acute shortness of breath.  This was started about 1-2 days prior to admission.  Upon admission diagnosed with COPD exacerbation started on aggressive bronchodilators, steroids and azithromycin. ? ? ?Assessment & Plan: ?  ?Principal Problem: ?  Acute exacerbation of chronic obstructive pulmonary disease (COPD) ([place]) ?Active Problems: ?  Essential hypertension ?  Tobacco dependence ?  COPD exacerbation ([place]) ?  Acute respiratory failure with hypoxia due to COPD exacerbation ?  HLD (hyperlipidemia) ?  Malnutrition of moderate degree ? ?Acute exacerbation of chronic obstructive pulmonary disease (COPD) ([place]) ?Likely from ongoing tobacco use.  ?BNP negative, procalcitonin negative.  ?-Patient slowly improving still with some exertional shortness of breath and abnormal breath sounds.   ?-Chest tightness improving as well as wheezing.  ?-Continue Pulmicort, scheduled DuoNebs, azithromycin, Mucinex. ?-Decrease IV Solu-Medrol to 60 mg daily today and transition to oral prednisone 60 mg tomorrow.   ?-Ambulatory sats today with 93% on room air at rest, 87% with ambulation.   ?-Repeat ambulatory sats tomorrow.   ?-Supportive care. ?  ?HLD (hyperlipidemia) ?Statin.  ?  ?   ?  ?Acute respiratory failure with hypoxia due to COPD exacerbation ?Secondary to COPD exacerbation ?COVID/flu-negative ?Check ambulatory sats. ?See above ?  ?Tobacco dependence ?Tobacco cessation stressed to patient. ?Nicotine patch. ?  ?  ?Essential hypertension ?Currently controlled on current regimen of Norvasc.   ?Hydralazine as needed. ? ? ?DVT prophylaxis: SCDs ?Code Status: Full ?Family Communication: Updated patient.  No family at bedside. ?Disposition:   ? ?Status is: Inpatient ?Remains inpatient appropriate because: Severity of illness ?  ?Consultants:  ?None ? ?Procedures: ?Chest x-ray 06/14/2021 ? ? ?Antimicrobials:  ?IV azithromycin 06/14/2021 >>> 06/15/2021 ? oral azithromycin 06/16/2021>>>> 06/19/2021 ? ? ?Subjective: ?Laying in bed.  Shortness of breath slowly improving however states still with some shortness of breath on exertion.  No chest pain.  Some complaints of some abdominal discomfort.  Tolerating current diet.  States he will ambulate this afternoon once RN is free.  ? ?Objective: ?Vitals:  ? 06/17/21 0322 06/17/21 0809 06/17/21 0935 06/17/21 1408  ?BP: 123/86  113/83   ?Pulse: 75  81   ?Resp: 12  18   ?Temp: 98 ?F (36.7 ?C)  98.2 ?F (36.8 ?C)   ?TempSrc: Oral  Oral   ?SpO2: 92% 92% 92% 95%  ?Weight:      ?Height:      ? ? ?Intake/Output Summary (Last 24 hours) at 06/17/2021 1842 ?Last data filed at 06/17/2021 1300 ?Gross per 24 hour  ?Intake 360 ml  ?Output --  ?Net 360 ml  ? ? ?Filed Weights  ? 06/14/21 1743 06/15/21 0500  ?Weight: 57.4 kg 57.4 kg  ? ? ?Examination: ? ?General exam: Appears calm and comfortable  ?Respiratory system: Less tight.  Decreasing scattered coarse breath sounds.  Minimal expiratory wheezing.  Fair air movement.  Speaking in full sentences. ?Cardiovascular system: Regular rate rhythm no murmurs rubs or gallops.  No JVD.  No lower extremity edema. ?Gastrointestinal system: Abdomen is soft, nontender, nondistended, positive bowel sounds.  No rebound.  No guarding. ?Central nervous system: Alert and oriented. No focal neurological deficits. ?Extremities: Symmetric 5 x 5 power. ?Skin: No rashes, lesions or  ulcers ?Psychiatry: Judgement and insight appear normal. Mood & affect appropriate.  ? ? ? ?Data Reviewed: I have personally reviewed following labs and imaging studies ? ?CBC: ?Recent Labs  ?Lab 06/14/21 ?0238 06/14/21 ?0701 06/14/21 ?3532  ?WBC 9.5 7.2  --   ?NEUTROABS 4.1 6.6  --   ?HGB 13.7 12.4* 12.2*  ?HCT 39.4 37.5* 36.0*   ?MCV 99.5 101.6*  --   ?PLT 253 258  --   ? ? ? ?Basic Metabolic Panel: ?Recent Labs  ?Lab 06/14/21 ?9924 06/14/21 ?0701 06/14/21 ?2683 06/15/21 ?0143 06/16/21 ?4196 06/17/21 ?0320  ?NA 142 142 142 140 139 140  ?K 3.5 3.2* 3.2* 3.7 3.9 4.1  ?CL 109 111  --  110 104 106  ?CO2 23 19*  --  '22 25 23  '$ ?GLUCOSE 108* 141*  --  129* 138* 140*  ?BUN 9 10  --  '16 15 20  '$ ?CREATININE 0.85 0.81  --  0.92 0.91 0.91  ?CALCIUM 9.5 8.8*  --  9.5 9.6 9.4  ?MG  --  2.2  --  2.0 1.9 1.9  ?PHOS  --  2.9  --   --   --   --   ? ? ? ?GFR: ?Estimated Creatinine Clearance: 64.8 mL/min (by C-G formula based on SCr of 0.91 mg/dL). ? ?Liver Function Tests: ?Recent Labs  ?Lab 06/14/21 ?0701  ?AST 20  ?ALT 17  ?ALKPHOS 59  ?BILITOT 0.6  ?PROT 6.7  ?ALBUMIN 3.9  ? ? ? ?CBG: ?No results for input(s): GLUCAP in the last 168 hours. ? ? ?Recent Results (from the past 240 hour(s))  ?Resp Panel by RT-PCR (Flu A&B, Covid) Nasopharyngeal Swab     Status: None  ? Collection Time: 06/14/21  2:34 AM  ? Specimen: Nasopharyngeal Swab; Nasopharyngeal(NP) swabs in vial transport medium  ?Result Value Ref Range Status  ? SARS Coronavirus 2 by RT PCR NEGATIVE NEGATIVE Final  ?  Comment: (NOTE) ?SARS-CoV-2 target nucleic acids are NOT DETECTED. ? ?The SARS-CoV-2 RNA is generally detectable in upper respiratory ?specimens during the acute phase of infection. The lowest ?concentration of SARS-CoV-2 viral copies this assay can detect is ?138 copies/mL. A negative result does not preclude SARS-Cov-2 ?infection and should not be used as the sole basis for treatment or ?other patient management decisions. A negative result may occur with  ?improper specimen collection/handling, submission of specimen other ?than nasopharyngeal swab, presence of viral mutation(s) within the ?areas targeted by this assay, and inadequate number of viral ?copies(<138 copies/mL). A negative result must be combined with ?clinical observations, patient history, and  epidemiological ?information. The expected result is Negative. ? ?Fact Sheet for Patients:  ?EntrepreneurPulse.com.au ? ?Fact Sheet for Healthcare Providers:  ?IncredibleEmployment.be ? ?This test is no t yet approved or cleared by the [place] FDA and  ?has been authorized for detection and/or diagnosis of SARS-CoV-2 by ?FDA under an Emergency Use Authorization (EUA). This EUA will remain  ?in effect (meaning this test can be used) for the duration of the ?COVID-19 declaration under Section 564(b)(1) of the Act, 21 ?U.S.C.section 360bbb-3(b)(1), unless the authorization is terminated  ?or revoked sooner.  ? ? ?  ? Influenza A by PCR NEGATIVE NEGATIVE Final  ? Influenza B by PCR NEGATIVE NEGATIVE Final  ?  Comment: (NOTE) ?The Xpert Xpress SARS-CoV-2/FLU/RSV plus assay is intended as an aid ?in the diagnosis of influenza from Nasopharyngeal swab specimens and ?should not be used as a sole basis for treatment. Nasal washings and ?aspirates  are unacceptable for Xpert Xpress SARS-CoV-2/FLU/RSV ?testing. ? ?Fact Sheet for Patients: ?EntrepreneurPulse.com.au ? ?Fact Sheet for Healthcare Providers: ?IncredibleEmployment.be ? ?This test is not yet approved or cleared by the [place] FDA and ?has been authorized for detection and/or diagnosis of SARS-CoV-2 by ?FDA under an Emergency Use Authorization (EUA). This EUA will remain ?in effect (meaning this test can be used) for the duration of the ?COVID-19 declaration under Section 564(b)(1) of the Act, 21 U.S.C. ?section 360bbb-3(b)(1), unless the authorization is terminated or ?revoked. ? ?Performed at [facility], [place] [address] [place], [place] ?[address] ?  ? ?  ? ? ? ? ? ?Radiology Studies: ?No results found. ? ? ? ? ? ?Scheduled Meds: ? amLODipine  5 mg Oral Daily  ? atorvastatin  10 mg Oral Daily  ? azithromycin  250 mg Oral Daily  ? budesonide (PULMICORT) nebulizer solution  0.5 mg  Nebulization BID  ? dextromethorphan-guaiFENesin  2 tablet Oral BID  ? feeding supplement  237 mL Oral BID BM  ? ipratropium-albuterol  3 mL Nebulization TID  ? methylPREDNISolone (SOLU-MEDROL) injection  60 mg Intravenous Q24H  ? mul

## 2021-06-17 NOTE — Progress Notes (Signed)
SaO2 on room air at rest = 93% ?SaO2 on room air while ambulating = 87% ?Declined using oxygen while ambulating.  ?When assisted back to bed, SaO2 92% on room air post ambulation.  ?

## 2021-06-18 DIAGNOSIS — I10 Essential (primary) hypertension: Secondary | ICD-10-CM | POA: Diagnosis not present

## 2021-06-18 DIAGNOSIS — J9601 Acute respiratory failure with hypoxia: Secondary | ICD-10-CM | POA: Diagnosis not present

## 2021-06-18 DIAGNOSIS — J441 Chronic obstructive pulmonary disease with (acute) exacerbation: Secondary | ICD-10-CM | POA: Diagnosis not present

## 2021-06-18 DIAGNOSIS — E785 Hyperlipidemia, unspecified: Secondary | ICD-10-CM | POA: Diagnosis not present

## 2021-06-18 LAB — BASIC METABOLIC PANEL
Anion gap: 12 (ref 5–15)
BUN: 19 mg/dL (ref 8–23)
CO2: 23 mmol/L (ref 22–32)
Calcium: 9.2 mg/dL (ref 8.9–10.3)
Chloride: 104 mmol/L (ref 98–111)
Creatinine, Ser: 0.85 mg/dL (ref 0.61–1.24)
GFR, Estimated: >60 mL/min (ref >60)
Glucose, Bld: 114 mg/dL — ABNORMAL HIGH (ref 70–99)
Potassium: 3.7 mmol/L (ref 3.5–5.1)
Sodium: 139 mmol/L (ref 135–145)

## 2021-06-18 LAB — MAGNESIUM: Magnesium: 2 mg/dL (ref 1.7–2.4)

## 2021-06-18 MED ORDER — SORBITOL 70 % SOLN
30.0000 mL | Freq: Once | Status: DC
Start: 2021-06-18 — End: 2021-06-18
  Filled 2021-06-18 (×2): qty 30

## 2021-06-18 MED ORDER — PREDNISONE 20 MG PO TABS
60.0000 mg | ORAL_TABLET | Freq: Every day | ORAL | Status: AC
  Administered 2021-06-19 – 2021-06-21 (×3): 60 mg via ORAL
  Filled 2021-06-18 (×3): qty 3

## 2021-06-18 MED ORDER — FUROSEMIDE 10 MG/ML IJ SOLN
20.0000 mg | Freq: Once | INTRAMUSCULAR | Status: AC
  Administered 2021-06-18: 20 mg via INTRAVENOUS
  Filled 2021-06-18: qty 2

## 2021-06-18 NOTE — Progress Notes (Signed)
?PROGRESS NOTE ? ? ? ?Jason Wong  EXN:[id number] DOB: 04-25-1955 DOA: 06/14/2021 ?PCP: Ladell Pier, MD  ? ? ?Chief Complaint  ?Patient presents with  ? Shortness of Breath  ? ? ?Brief Narrative:  ?66 year old with history of chronic tobacco use, COPD, HTN, HLD admitted to the hospital for acute shortness of breath.  This was started about 1-2 days prior to admission.  Upon admission diagnosed with COPD exacerbation started on aggressive bronchodilators, steroids and azithromycin. ? ? ?Assessment & Plan: ?  ?Principal Problem: ?  Acute exacerbation of chronic obstructive pulmonary disease (COPD) ([place]) ?Active Problems: ?  Essential hypertension ?  Tobacco dependence ?  COPD exacerbation ([place]) ?  Acute respiratory failure with hypoxia due to COPD exacerbation ?  HLD (hyperlipidemia) ?  Malnutrition of moderate degree ? ?Acute exacerbation of chronic obstructive pulmonary disease (COPD) ([place]) ?Likely from ongoing tobacco use.  ?BNP negative, procalcitonin negative.  ?-Patient slowly improving still with some exertional shortness of breath and abnormal breath sounds.   ?-Chest tightness and wheezing improved. ?-Continue Pulmicort, scheduled DuoNebs, azithromycin, Mucinex. ?-Transition from IV Solu-Medrol to oral prednisone taper tomorrow.  ?-Ambulatory sats today with 100% on room air at rest, 84% with ambulation.   ?-Lasix 20 mg IV x1. ?-Repeat ambulatory sats tomorrow.   ?-Supportive care. ?  ?HLD (hyperlipidemia) ?Statin.  ?  ?   ?  ?Acute respiratory failure with hypoxia due to COPD exacerbation ?Secondary to COPD exacerbation ?COVID/flu-negative ?Still desatting with ambulation with sats of 84%.   ?Continue treatment for acute COPD exacerbation.   ?Lasix 20 mg IV x1.   ?Repeat ambulatory sats tomorrow.   ?See above ?  ?Tobacco dependence ?Tobacco cessation stressed to patient. ?Nicotine patch. ?  ?  ?Essential hypertension ?Continue Norvasc.   ?-Hydralazine as needed.   ? ? ?DVT prophylaxis: SCDs ?Code  Status: Full ?Family Communication: Updated patient.  No family at bedside. ?Disposition:  ? ?Status is: Inpatient ?Remains inpatient appropriate because: Severity of illness ?  ?Consultants:  ?None ? ?Procedures: ?Chest x-ray 06/14/2021 ? ? ?Antimicrobials:  ?IV azithromycin 06/14/2021 >>> 06/15/2021 ? oral azithromycin 06/16/2021>>>> 06/19/2021 ? ? ?Subjective: ?Laying in bed.  States shortness of breath on exertion slowly improving.  No chest pain.  Denies any significant abdominal pain.  Tolerating current diet. ? ?Objective: ?Vitals:  ? 06/18/21 0500 06/18/21 0718 06/18/21 0800 06/18/21 1129  ?BP:   123/78   ?Pulse:  78 80 83  ?Resp:      ?Temp:      ?TempSrc:      ?SpO2:   92%   ?Weight: 58.3 kg     ?Height:      ? ?No intake or output data in the 24 hours ending 06/18/21 1303 ? ?Filed Weights  ? 06/14/21 1743 06/15/21 0500 06/18/21 0500  ?Weight: 57.4 kg 57.4 kg 58.3 kg  ? ? ?Examination: ? ?General exam: NAD. ?Respiratory system: Decreased cardiac coarse breath sounds tight.  Fair air movement.  Minimal expiratory wheezing.  Speaking in full sentences.  No use of accessory muscles of respiration.   ?Cardiovascular system: RRR no murmurs rubs or gallops.  No JVD.  No lower extremity edema. ?Gastrointestinal system: Abdomen is soft, nontender, nondistended, positive bowel sounds.  No rebound.  No guarding. ?Central nervous system: Alert and oriented. No focal neurological deficits. ?Extremities: Symmetric 5 x 5 power. ?Skin: No rashes, lesions or ulcers ?Psychiatry: Judgement and insight appear normal. Mood & affect appropriate.  ? ? ? ?Data Reviewed: I have personally  reviewed following labs and imaging studies ? ?CBC: ?Recent Labs  ?Lab 06/14/21 ?0238 06/14/21 ?0701 06/14/21 ?2992  ?WBC 9.5 7.2  --   ?NEUTROABS 4.1 6.6  --   ?HGB 13.7 12.4* 12.2*  ?HCT 39.4 37.5* 36.0*  ?MCV 99.5 101.6*  --   ?PLT 253 258  --   ? ? ? ?Basic Metabolic Panel: ?Recent Labs  ?Lab 06/14/21 ?0701 06/14/21 ?4268 06/15/21 ?0143  06/16/21 ?3419 06/17/21 ?0320 06/18/21 ?0109  ?NA 142 142 140 139 140 139  ?K 3.2* 3.2* 3.7 3.9 4.1 3.7  ?CL 111  --  110 104 106 104  ?CO2 19*  --  '22 25 23 23  '$ ?GLUCOSE 141*  --  129* 138* 140* 114*  ?BUN 10  --  '16 15 20 19  '$ ?CREATININE 0.81  --  0.92 0.91 0.91 0.85  ?CALCIUM 8.8*  --  9.5 9.6 9.4 9.2  ?MG 2.2  --  2.0 1.9 1.9 2.0  ?PHOS 2.9  --   --   --   --   --   ? ? ? ?GFR: ?Estimated Creatinine Clearance: 70.5 mL/min (by C-G formula based on SCr of 0.85 mg/dL). ? ?Liver Function Tests: ?Recent Labs  ?Lab 06/14/21 ?0701  ?AST 20  ?ALT 17  ?ALKPHOS 59  ?BILITOT 0.6  ?PROT 6.7  ?ALBUMIN 3.9  ? ? ? ?CBG: ?No results for input(s): GLUCAP in the last 168 hours. ? ? ?Recent Results (from the past 240 hour(s))  ?Resp Panel by RT-PCR (Flu A&B, Covid) Nasopharyngeal Swab     Status: None  ? Collection Time: 06/14/21  2:34 AM  ? Specimen: Nasopharyngeal Swab; Nasopharyngeal(NP) swabs in vial transport medium  ?Result Value Ref Range Status  ? SARS Coronavirus 2 by RT PCR NEGATIVE NEGATIVE Final  ?  Comment: (NOTE) ?SARS-CoV-2 target nucleic acids are NOT DETECTED. ? ?The SARS-CoV-2 RNA is generally detectable in upper respiratory ?specimens during the acute phase of infection. The lowest ?concentration of SARS-CoV-2 viral copies this assay can detect is ?138 copies/mL. A negative result does not preclude SARS-Cov-2 ?infection and should not be used as the sole basis for treatment or ?other patient management decisions. A negative result may occur with  ?improper specimen collection/handling, submission of specimen other ?than nasopharyngeal swab, presence of viral mutation(s) within the ?areas targeted by this assay, and inadequate number of viral ?copies(<138 copies/mL). A negative result must be combined with ?clinical observations, patient history, and epidemiological ?information. The expected result is Negative. ? ?Fact Sheet for Patients:  ?EntrepreneurPulse.com.au ? ?Fact Sheet for Healthcare  Providers:  ?IncredibleEmployment.be ? ?This test is no t yet approved or cleared by the [place] FDA and  ?has been authorized for detection and/or diagnosis of SARS-CoV-2 by ?FDA under an Emergency Use Authorization (EUA). This EUA will remain  ?in effect (meaning this test can be used) for the duration of the ?COVID-19 declaration under Section 564(b)(1) of the Act, 21 ?U.S.C.section 360bbb-3(b)(1), unless the authorization is terminated  ?or revoked sooner.  ? ? ?  ? Influenza A by PCR NEGATIVE NEGATIVE Final  ? Influenza B by PCR NEGATIVE NEGATIVE Final  ?  Comment: (NOTE) ?The Xpert Xpress SARS-CoV-2/FLU/RSV plus assay is intended as an aid ?in the diagnosis of influenza from Nasopharyngeal swab specimens and ?should not be used as a sole basis for treatment. Nasal washings and ?aspirates are unacceptable for Xpert Xpress SARS-CoV-2/FLU/RSV ?testing. ? ?Fact Sheet for Patients: ?EntrepreneurPulse.com.au ? ?Fact Sheet for Healthcare Providers: ?IncredibleEmployment.be ? ?This  test is not yet approved or cleared by the [place] FDA and ?has been authorized for detection and/or diagnosis of SARS-CoV-2 by ?FDA under an Emergency Use Authorization (EUA). This EUA will remain ?in effect (meaning this test can be used) for the duration of the ?COVID-19 declaration under Section 564(b)(1) of the Act, 21 U.S.C. ?section 360bbb-3(b)(1), unless the authorization is terminated or ?revoked. ? ?Performed at [facility], [place] [address] [place], [place] ?[address] ?  ? ?  ? ? ? ? ? ?Radiology Studies: ?No results found. ? ? ? ? ? ?Scheduled Meds: ? amLODipine  5 mg Oral Daily  ? atorvastatin  10 mg Oral Daily  ? budesonide (PULMICORT) nebulizer solution  0.5 mg Nebulization BID  ? dextromethorphan-guaiFENesin  2 tablet Oral BID  ? feeding supplement  237 mL Oral BID BM  ? ipratropium-albuterol  3 mL Nebulization TID  ? multivitamin with minerals  1 tablet  Oral Daily  ? nicotine  14 mg Transdermal Daily  ? [START ON 06/19/2021] predniSONE  60 mg Oral QAC breakfast  ? ?Continuous Infusions: ? ? LOS: 4 days  ? ? ?Time spent: 35 minutes ? ? ? ?Irine Seal, MD ?[facility]

## 2021-06-18 NOTE — Care Management Important Message (Signed)
Important Message ? ?Patient Details  ?Name: Jason Wong ?MRN: [id number] ?Date of Birth: [birth year] ? ? ?Medicare Important Message Given:  Yes ? ? ? ? ?Sharlene Mccluskey ?06/18/2021, 1:05 PM ?

## 2021-06-18 NOTE — Progress Notes (Signed)
SATURATION QUALIFICATIONS: (This note is used to comply with regulatory documentation for home oxygen) ? ?Patient Saturations on Room Air at Rest = 100% ? ?Patient Saturations on Room Air while Ambulating = 84% with over 100 feet walking ?Patient Saturations on 20 Liters of oxygen while Ambulating = 100% ? ?Please briefly explain why patient needs home oxygen: ?

## 2021-06-19 DIAGNOSIS — J9601 Acute respiratory failure with hypoxia: Secondary | ICD-10-CM | POA: Diagnosis not present

## 2021-06-19 DIAGNOSIS — I10 Essential (primary) hypertension: Secondary | ICD-10-CM | POA: Diagnosis not present

## 2021-06-19 DIAGNOSIS — E785 Hyperlipidemia, unspecified: Secondary | ICD-10-CM | POA: Diagnosis not present

## 2021-06-19 DIAGNOSIS — J441 Chronic obstructive pulmonary disease with (acute) exacerbation: Secondary | ICD-10-CM | POA: Diagnosis not present

## 2021-06-19 LAB — BASIC METABOLIC PANEL
Anion gap: 9 (ref 5–15)
BUN: 24 mg/dL — ABNORMAL HIGH (ref 8–23)
CO2: 25 mmol/L (ref 22–32)
Calcium: 9.1 mg/dL (ref 8.9–10.3)
Chloride: 106 mmol/L (ref 98–111)
Creatinine, Ser: 0.98 mg/dL (ref 0.61–1.24)
GFR, Estimated: >60 mL/min (ref >60)
Glucose, Bld: 101 mg/dL — ABNORMAL HIGH (ref 70–99)
Potassium: 3.2 mmol/L — ABNORMAL LOW (ref 3.5–5.1)
Sodium: 140 mmol/L (ref 135–145)

## 2021-06-19 LAB — MAGNESIUM: Magnesium: 2 mg/dL (ref 1.7–2.4)

## 2021-06-19 MED ORDER — POTASSIUM CHLORIDE CRYS ER 20 MEQ PO TBCR
40.0000 meq | EXTENDED_RELEASE_TABLET | Freq: Once | ORAL | Status: AC
  Administered 2021-06-19: 40 meq via ORAL
  Filled 2021-06-19: qty 2

## 2021-06-19 NOTE — Progress Notes (Addendum)
?PROGRESS NOTE ? ? ? ?Jason Wong  YSA:[id number] DOB: 1955-08-06 DOA: 06/14/2021 ?PCP: Ladell Pier, MD  ? ? ?Chief Complaint  ?Patient presents with  ? Shortness of Breath  ? ? ?Brief Narrative:  ?66 year old with history of chronic tobacco use, COPD, HTN, HLD admitted to the hospital for acute shortness of breath.  This was started about 1-2 days prior to admission.  Upon admission diagnosed with COPD exacerbation started on aggressive bronchodilators, steroids and azithromycin. ? ? ?Assessment & Plan: ?  ?Principal Problem: ?  Acute exacerbation of chronic obstructive pulmonary disease (COPD) ([place]) ?Active Problems: ?  Essential hypertension ?  Tobacco dependence ?  COPD exacerbation ([place]) ?  Acute respiratory failure with hypoxia due to COPD exacerbation ?  HLD (hyperlipidemia) ?  Malnutrition of moderate degree ? ?Acute exacerbation of chronic obstructive pulmonary disease (COPD) ([place]) ?Likely from ongoing tobacco use.  ?BNP negative, procalcitonin negative.  ?-Patient slowly improving with exertional shortness of breath.   ?-Chest tightness and wheezing improving.  ?-Continue Pulmicort, scheduled DuoNebs, azithromycin, Mucinex. ?-Patient being transitioned from IV Solu-Medrol to prednisone taper today.   ?-Ambulatory sats today with 100% on room air at rest, 84% with ambulation.   ?-Status post Lasix 20 mg IV x1 on 06/18/2021. ?-May need home O2. ?-Repeat ambulatory sats in the AM. ?-Supportive care. ?  ?HLD (hyperlipidemia) ?Statin.  ?  ?   ?  ?Acute respiratory failure with hypoxia due to COPD exacerbation ?Secondary to COPD exacerbation ?COVID/flu-negative ?Still desatting with ambulation with sats of 84%.   ?Continue treatment for acute COPD exacerbation.  ?-Received Lasix 20 mg IV x1 however urine output not recorded. ?-Repeat ambulatory sats tomorrow. ?-See above. ?  ?Tobacco dependence ?Tobacco cessation stressed to patient. ?Nicotine patch. ?  ?  ?Essential hypertension ?-Controlled on current  regimen of Norvasc. ?-Hydralazine as needed. ? ?Hypokalemia ?-Likely secondary to diuretics. ?-Replete. ? ? ?DVT prophylaxis: SCDs ?Code Status: Full ?Family Communication: Updated patient.  No family at bedside. ?Disposition: Likely home in the next 24 hours ? ?Status is: Inpatient ?Remains inpatient appropriate because: Severity of illness ?  ?Consultants:  ?None ? ?Procedures: ?Chest x-ray 06/14/2021 ? ? ?Antimicrobials:  ?IV azithromycin 06/14/2021 >>> 06/15/2021 ? oral azithromycin 06/16/2021>>>> 06/19/2021 ? ? ?Subjective: ?Laying in bed.  Still states some shortness of breath on exertion and had to stop while ambulating yesterday.  Overall feeling better.  No chest pain.  No abdominal pain.  Stated had a bowel movement yesterday.  Tolerating current diet. ? ?Objective: ?Vitals:  ? 06/19/21 0804 06/19/21 1159 06/19/21 1322 06/19/21 1557  ?BP: 136/85 120/77  113/80  ?Pulse: 81 83 85 81  ?Resp: '17 19 18 18  '$ ?Temp: 98.4 ?F (36.9 ?C) 98 ?F (36.7 ?C)  97.6 ?F (36.4 ?C)  ?TempSrc: Oral Oral  Oral  ?SpO2:      ?Weight:      ?Height:      ? ?No intake or output data in the 24 hours ending 06/19/21 1756 ? ?Filed Weights  ? 06/15/21 0500 06/18/21 0500 06/19/21 0454  ?Weight: 57.4 kg 58.3 kg 58.8 kg  ? ? ?Examination: ? ?General exam: NAD. ?Respiratory system: Improving coarse breath sounds.  Less tight.  Fair air movement.  Minimal expiratory wheezing.  No use of accessory muscles of respiration.  Speaking in full sentences. ?Cardiovascular system: Regular rate rhythm no murmurs rubs or gallops.  No JVD.  No lower extremity edema. ?Gastrointestinal system: Abdomen is soft, nontender, nondistended, positive bowel sounds.  No rebound.  No guarding. ?Central nervous system: Alert and oriented. No focal neurological deficits. ?Extremities: Symmetric 5 x 5 power. ?Skin: No rashes, lesions or ulcers ?Psychiatry: Judgement and insight appear normal. Mood & affect appropriate.  ? ? ? ?Data Reviewed: I have personally reviewed  following labs and imaging studies ? ?CBC: ?Recent Labs  ?Lab 06/14/21 ?0238 06/14/21 ?0701 06/14/21 ?4431  ?WBC 9.5 7.2  --   ?NEUTROABS 4.1 6.6  --   ?HGB 13.7 12.4* 12.2*  ?HCT 39.4 37.5* 36.0*  ?MCV 99.5 101.6*  --   ?PLT 253 258  --   ? ? ? ?Basic Metabolic Panel: ?Recent Labs  ?Lab 06/14/21 ?0701 06/14/21 ?5400 06/15/21 ?0143 06/16/21 ?0226 06/17/21 ?0320 06/18/21 ?0109 06/19/21 ?8676  ?NA 142   < > 140 139 140 139 140  ?K 3.2*   < > 3.7 3.9 4.1 3.7 3.2*  ?CL 111  --  110 104 106 104 106  ?CO2 19*  --  '22 25 23 23 25  '$ ?GLUCOSE 141*  --  129* 138* 140* 114* 101*  ?BUN 10  --  '16 15 20 19 '$ 24*  ?CREATININE 0.81  --  0.92 0.91 0.91 0.85 0.98  ?CALCIUM 8.8*  --  9.5 9.6 9.4 9.2 9.1  ?MG 2.2  --  2.0 1.9 1.9 2.0 2.0  ?PHOS 2.9  --   --   --   --   --   --   ? < > = values in this interval not displayed.  ? ? ? ?GFR: ?Estimated Creatinine Clearance: 61.7 mL/min (by C-G formula based on SCr of 0.98 mg/dL). ? ?Liver Function Tests: ?Recent Labs  ?Lab 06/14/21 ?0701  ?AST 20  ?ALT 17  ?ALKPHOS 59  ?BILITOT 0.6  ?PROT 6.7  ?ALBUMIN 3.9  ? ? ? ?CBG: ?No results for input(s): GLUCAP in the last 168 hours. ? ? ?Recent Results (from the past 240 hour(s))  ?Resp Panel by RT-PCR (Flu A&B, Covid) Nasopharyngeal Swab     Status: None  ? Collection Time: 06/14/21  2:34 AM  ? Specimen: Nasopharyngeal Swab; Nasopharyngeal(NP) swabs in vial transport medium  ?Result Value Ref Range Status  ? SARS Coronavirus 2 by RT PCR NEGATIVE NEGATIVE Final  ?  Comment: (NOTE) ?SARS-CoV-2 target nucleic acids are NOT DETECTED. ? ?The SARS-CoV-2 RNA is generally detectable in upper respiratory ?specimens during the acute phase of infection. The lowest ?concentration of SARS-CoV-2 viral copies this assay can detect is ?138 copies/mL. A negative result does not preclude SARS-Cov-2 ?infection and should not be used as the sole basis for treatment or ?other patient management decisions. A negative result may occur with  ?improper specimen  collection/handling, submission of specimen other ?than nasopharyngeal swab, presence of viral mutation(s) within the ?areas targeted by this assay, and inadequate number of viral ?copies(<138 copies/mL). A negative result must be combined with ?clinical observations, patient history, and epidemiological ?information. The expected result is Negative. ? ?Fact Sheet for Patients:  ?EntrepreneurPulse.com.au ? ?Fact Sheet for Healthcare Providers:  ?IncredibleEmployment.be ? ?This test is no t yet approved or cleared by the [place] FDA and  ?has been authorized for detection and/or diagnosis of SARS-CoV-2 by ?FDA under an Emergency Use Authorization (EUA). This EUA will remain  ?in effect (meaning this test can be used) for the duration of the ?COVID-19 declaration under Section 564(b)(1) of the Act, 21 ?U.S.C.section 360bbb-3(b)(1), unless the authorization is terminated  ?or revoked sooner.  ? ? ?  ? Influenza A by PCR  NEGATIVE NEGATIVE Final  ? Influenza B by PCR NEGATIVE NEGATIVE Final  ?  Comment: (NOTE) ?The Xpert Xpress SARS-CoV-2/FLU/RSV plus assay is intended as an aid ?in the diagnosis of influenza from Nasopharyngeal swab specimens and ?should not be used as a sole basis for treatment. Nasal washings and ?aspirates are unacceptable for Xpert Xpress SARS-CoV-2/FLU/RSV ?testing. ? ?Fact Sheet for Patients: ?EntrepreneurPulse.com.au ? ?Fact Sheet for Healthcare Providers: ?IncredibleEmployment.be ? ?This test is not yet approved or cleared by the [place] FDA and ?has been authorized for detection and/or diagnosis of SARS-CoV-2 by ?FDA under an Emergency Use Authorization (EUA). This EUA will remain ?in effect (meaning this test can be used) for the duration of the ?COVID-19 declaration under Section 564(b)(1) of the Act, 21 U.S.C. ?section 360bbb-3(b)(1), unless the authorization is terminated or ?revoked. ? ?Performed at [facility], [place] [address] [place], [place] ?[address] ?  ? ?  ? ? ? ? ? ?Radiology Studies: ?No results found. ? ? ? ? ? ?Scheduled Meds: ? amLODipine  5 mg Oral Daily  ? atorvastatin  10 mg Oral Daily  ? budesonide (PULM

## 2021-06-20 DIAGNOSIS — E785 Hyperlipidemia, unspecified: Secondary | ICD-10-CM | POA: Diagnosis not present

## 2021-06-20 DIAGNOSIS — J9601 Acute respiratory failure with hypoxia: Secondary | ICD-10-CM | POA: Diagnosis not present

## 2021-06-20 DIAGNOSIS — J441 Chronic obstructive pulmonary disease with (acute) exacerbation: Secondary | ICD-10-CM | POA: Diagnosis not present

## 2021-06-20 DIAGNOSIS — I10 Essential (primary) hypertension: Secondary | ICD-10-CM | POA: Diagnosis not present

## 2021-06-20 LAB — BASIC METABOLIC PANEL
Anion gap: 9 (ref 5–15)
BUN: 21 mg/dL (ref 8–23)
CO2: 22 mmol/L (ref 22–32)
Calcium: 9.4 mg/dL (ref 8.9–10.3)
Chloride: 107 mmol/L (ref 98–111)
Creatinine, Ser: 0.81 mg/dL (ref 0.61–1.24)
GFR, Estimated: >60 mL/min (ref >60)
Glucose, Bld: 157 mg/dL — ABNORMAL HIGH (ref 70–99)
Potassium: 3.9 mmol/L (ref 3.5–5.1)
Sodium: 138 mmol/L (ref 135–145)

## 2021-06-20 LAB — MAGNESIUM: Magnesium: 2.2 mg/dL (ref 1.7–2.4)

## 2021-06-20 MED ORDER — PANTOPRAZOLE SODIUM 40 MG PO TBEC
40.0000 mg | DELAYED_RELEASE_TABLET | Freq: Every day | ORAL | Status: DC
Start: 2021-06-20 — End: 2021-06-22
  Administered 2021-06-20 – 2021-06-21 (×2): 40 mg via ORAL
  Filled 2021-06-20 (×2): qty 1

## 2021-06-20 MED ORDER — ALUM & MAG HYDROXIDE-SIMETH 200-200-20 MG/5ML PO SUSP
30.0000 mL | Freq: Once | ORAL | Status: AC
Start: 2021-06-20 — End: 2021-06-20
  Administered 2021-06-20: 30 mL via ORAL
  Filled 2021-06-20: qty 30

## 2021-06-20 MED ORDER — MOMETASONE FURO-FORMOTEROL FUM 200-5 MCG/ACT IN AERO
2.0000 | INHALATION_SPRAY | Freq: Two times a day (BID) | RESPIRATORY_TRACT | Status: DC
  Administered 2021-06-21: 2 via RESPIRATORY_TRACT
  Filled 2021-06-20: qty 8.8

## 2021-06-20 MED ORDER — LIDOCAINE VISCOUS HCL 2 % MT SOLN
15.0000 mL | Freq: Once | OROMUCOSAL | Status: AC
  Administered 2021-06-20: 15 mL via ORAL
  Filled 2021-06-20 (×2): qty 15

## 2021-06-20 MED ORDER — IPRATROPIUM-ALBUTEROL 0.5-2.5 (3) MG/3ML IN SOLN
3.0000 mL | Freq: Two times a day (BID) | RESPIRATORY_TRACT | Status: DC
  Administered 2021-06-20 – 2021-06-21 (×2): 3 mL via RESPIRATORY_TRACT
  Filled 2021-06-20: qty 3

## 2021-06-20 MED ORDER — FUROSEMIDE 10 MG/ML IJ SOLN
20.0000 mg | Freq: Once | INTRAMUSCULAR | Status: AC
  Administered 2021-06-20: 20 mg via INTRAVENOUS
  Filled 2021-06-20: qty 2

## 2021-06-20 NOTE — Progress Notes (Signed)
?PROGRESS NOTE ? ? ? ?Jason Wong  IRC:[id number] DOB: 28-Nov-1955 DOA: 06/14/2021 ?PCP: Ladell Pier, MD  ? ? ?Chief Complaint  ?Patient presents with  ? Shortness of Breath  ? ? ?Brief Narrative:  ?66 year old with history of chronic tobacco use, COPD, HTN, HLD admitted to the hospital for acute shortness of breath.  This was started about 1-2 days prior to admission.  Upon admission diagnosed with COPD exacerbation started on aggressive bronchodilators, steroids and azithromycin. ? ? ?Assessment & Plan: ?  ?Principal Problem: ?  Acute exacerbation of chronic obstructive pulmonary disease (COPD) ([place]) ?Active Problems: ?  Essential hypertension ?  Tobacco dependence ?  COPD exacerbation ([place]) ?  Acute respiratory failure with hypoxia due to COPD exacerbation ?  HLD (hyperlipidemia) ?  Malnutrition of moderate degree ? ?Acute exacerbation of chronic obstructive pulmonary disease (COPD) ([place]) ?Likely from ongoing tobacco use.  ?BNP negative, procalcitonin negative.  ?-Patient slowly improving however still with exertional shortness of breath.   ?-Chest tightness and wheezing improving.  ?-Continue scheduled DuoNebs, azithromycin, Mucinex. ?-Discontinue Pulmicort and placed on Dulera. ?-Patient transitioned from IV Solu-Medrol to prednisone taper. ?-Ambulatory sats of 89% on room air with some complaints of dyspnea on exertion per RN.   ?-Status post Lasix 20 mg IV x1 on 06/18/2021 with urine output not accurately recorded. ?-We will give another dose of Lasix 20 mg IV x1 ?-May need home O2. ?-Repeat ambulatory sats in the AM. ?-Supportive care. ?  ?HLD (hyperlipidemia) ?Statin.  ?  ?   ?  ?Acute respiratory failure with hypoxia due to COPD exacerbation ?Secondary to COPD exacerbation ?COVID/flu-negative ?Still desatting with ambulation with sats of 89% today.   ?Continue treatment for acute COPD exacerbation.  ?-Received Lasix 20 mg IV x1 however urine output not recorded. ?-We will give another dose of Lasix 20  mg IV x1 today and repeat ambulatory sats in the AM. ?-See above. ?  ?Tobacco dependence ?Tobacco cessation stressed to patient. ?Nicotine patch. ?  ?  ?Essential hypertension ?-Controlled on current regimen of Norvasc. ?-Hydralazine as needed. ? ?Hypokalemia ?-Likely secondary to diuretics. ?-Repleted. ? ? ?DVT prophylaxis: SCDs ?Code Status: Full ?Family Communication: Updated patient.  No family at bedside. ?Disposition: Likely home in the next 24 hours ? ?Status is: Inpatient ?Remains inpatient appropriate because: Severity of illness ?  ?Consultants:  ?None ? ?Procedures: ?Chest x-ray 06/14/2021 ? ? ?Antimicrobials:  ?IV azithromycin 06/14/2021 >>> 06/15/2021 ? oral azithromycin 06/16/2021>>>> 06/19/2021 ? ? ?Subjective: ?Patient c/o heartburn per RN.  Per RN patient with dyspnea on exertion.  Patient with some complaints of not feeling too well today.  Some chest discomfort which he cannot quite describe ? ?Objective: ?Vitals:  ? 06/19/21 2342 06/20/21 0400 06/20/21 0717 06/20/21 0755  ?BP: (!) 120/94 113/87  117/86  ?Pulse: 79 71 76 79  ?Resp: '15 13 18 15  '$ ?Temp: 98.1 ?F (36.7 ?C) 98 ?F (36.7 ?C)  98.1 ?F (36.7 ?C)  ?TempSrc: Oral Oral  Oral  ?SpO2: 91% 93% 96%   ?Weight:      ?Height:      ? ? ?Intake/Output Summary (Last 24 hours) at 06/20/2021 1115 ?Last data filed at 06/20/2021 0757 ?Gross per 24 hour  ?Intake --  ?Output 350 ml  ?Net -350 ml  ? ? ?Filed Weights  ? 06/15/21 0500 06/18/21 0500 06/19/21 0454  ?Weight: 57.4 kg 58.3 kg 58.8 kg  ? ? ?Examination: ? ?General exam: NAD. ?Respiratory system: Improving coarse breath sounds.  Less tight.  Fair air movement.  No significant wheezing noted.  No use of accessory muscles of respiration.  Speaking in full sentences.   ?Cardiovascular system: RRR no murmurs rubs or gallops.  No JVD.  No lower extremity edema. ?Gastrointestinal system: Abdomen is soft, nontender, nondistended, positive bowel sounds.  No rebound.  No guarding. ?Central nervous system: Alert and  oriented. No focal neurological deficits. ?Extremities: Symmetric 5 x 5 power. ?Skin: No rashes, lesions or ulcers ?Psychiatry: Judgement and insight appear normal. Mood & affect appropriate.  ? ? ? ?Data Reviewed: I have personally reviewed following labs and imaging studies ? ?CBC: ?Recent Labs  ?Lab 06/14/21 ?0238 06/14/21 ?0701 06/14/21 ?7062  ?WBC 9.5 7.2  --   ?NEUTROABS 4.1 6.6  --   ?HGB 13.7 12.4* 12.2*  ?HCT 39.4 37.5* 36.0*  ?MCV 99.5 101.6*  --   ?PLT 253 258  --   ? ? ? ?Basic Metabolic Panel: ?Recent Labs  ?Lab 06/14/21 ?0701 06/14/21 ?0722 06/16/21 ?0226 06/17/21 ?0320 06/18/21 ?0109 06/19/21 ?3762 06/20/21 ?8315  ?NA 142   < > 139 140 139 140 138  ?K 3.2*   < > 3.9 4.1 3.7 3.2* 3.9  ?CL 111   < > 104 106 104 106 107  ?CO2 19*   < > '25 23 23 25 22  '$ ?GLUCOSE 141*   < > 138* 140* 114* 101* 157*  ?BUN 10   < > '15 20 19 '$ 24* 21  ?CREATININE 0.81   < > 0.91 0.91 0.85 0.98 0.81  ?CALCIUM 8.8*   < > 9.6 9.4 9.2 9.1 9.4  ?MG 2.2   < > 1.9 1.9 2.0 2.0 2.2  ?PHOS 2.9  --   --   --   --   --   --   ? < > = values in this interval not displayed.  ? ? ? ?GFR: ?Estimated Creatinine Clearance: 74.6 mL/min (by C-G formula based on SCr of 0.81 mg/dL). ? ?Liver Function Tests: ?Recent Labs  ?Lab 06/14/21 ?0701  ?AST 20  ?ALT 17  ?ALKPHOS 59  ?BILITOT 0.6  ?PROT 6.7  ?ALBUMIN 3.9  ? ? ? ?CBG: ?No results for input(s): GLUCAP in the last 168 hours. ? ? ?Recent Results (from the past 240 hour(s))  ?Resp Panel by RT-PCR (Flu A&B, Covid) Nasopharyngeal Swab     Status: None  ? Collection Time: 06/14/21  2:34 AM  ? Specimen: Nasopharyngeal Swab; Nasopharyngeal(NP) swabs in vial transport medium  ?Result Value Ref Range Status  ? SARS Coronavirus 2 by RT PCR NEGATIVE NEGATIVE Final  ?  Comment: (NOTE) ?SARS-CoV-2 target nucleic acids are NOT DETECTED. ? ?The SARS-CoV-2 RNA is generally detectable in upper respiratory ?specimens during the acute phase of infection. The lowest ?concentration of SARS-CoV-2 viral copies this assay  can detect is ?138 copies/mL. A negative result does not preclude SARS-Cov-2 ?infection and should not be used as the sole basis for treatment or ?other patient management decisions. A negative result may occur with  ?improper specimen collection/handling, submission of specimen other ?than nasopharyngeal swab, presence of viral mutation(s) within the ?areas targeted by this assay, and inadequate number of viral ?copies(<138 copies/mL). A negative result must be combined with ?clinical observations, patient history, and epidemiological ?information. The expected result is Negative. ? ?Fact Sheet for Patients:  ?EntrepreneurPulse.com.au ? ?Fact Sheet for Healthcare Providers:  ?IncredibleEmployment.be ? ?This test is no t yet approved or cleared by the [place] and  ?has been authorized for detection  and/or diagnosis of SARS-CoV-2 by ?FDA under an Emergency Use Authorization (EUA). This EUA will remain  ?in effect (meaning this test can be used) for the duration of the ?COVID-19 declaration under Section 564(b)(1) of the Act, 21 ?U.S.C.section 360bbb-3(b)(1), unless the authorization is terminated  ?or revoked sooner.  ? ? ?  ? Influenza A by PCR NEGATIVE NEGATIVE Final  ? Influenza B by PCR NEGATIVE NEGATIVE Final  ?  Comment: (NOTE) ?The Xpert Xpress SARS-CoV-2/FLU/RSV plus assay is intended as an aid ?in the diagnosis of influenza from Nasopharyngeal swab specimens and ?should not be used as a sole basis for treatment. Nasal washings and ?aspirates are unacceptable for Xpert Xpress SARS-CoV-2/FLU/RSV ?testing. ? ?Fact Sheet for Patients: ?EntrepreneurPulse.com.au ? ?Fact Sheet for Healthcare Providers: ?IncredibleEmployment.be ? ?This test is not yet approved or cleared by the [place] FDA and ?has been authorized for detection and/or diagnosis of SARS-CoV-2 by ?FDA under an Emergency Use Authorization (EUA). This EUA will  remain ?in effect (meaning this test can be used) for the duration of the ?COVID-19 declaration under Section 564(b)(1) of the Act, 21 U.S.C. ?section 360bbb-3(b)(1), unless the authorization is terminated or ?r

## 2021-06-20 NOTE — TOC Transition Note (Signed)
Transition of Care (TOC) - CM/SW Discharge Note ? ? ?Patient Details  ?Name: Jason Wong ?MRN: [id number] ?Date of Birth: 1955-06-23 ? ?Transition of Care (TOC) CM/SW Contact:  ?Carles Collet, RN ?Phone Number: ?06/20/2021, 12:44 PM ? ? ?Clinical Narrative:   Spoke w patient to discuss DC plan.  ?No HH needs. ?He states that he has transportation home. ?Will need DME home oxygen and nebulizer.  ?Ordered through [employer] to be delivered to room prior to DC ? ? ? ?Final next level of care: Home/Self Care ?Barriers to Discharge: No Barriers Identified ? ? ?Patient Goals and CMS Choice ?Patient states their goals for this hospitalization and ongoing recovery are:: to go home ?  ?  ? ?Discharge Placement ?  ?           ?  ?  ?  ?  ? ?Discharge Plan and Services ?  ?  ?           ?DME Arranged: Oxygen, Nebulizer machine ?DME Agency: [employer] ?Date DME Agency Contacted: 06/20/21 ?Time DME Agency Contacted: [phone] ?Representative spoke with at DME Agency: Brenton Grills ?  ?  ?  ?  ?  ? ?Social Determinants of Health (SDOH) Interventions ?  ? ? ?Readmission Risk Interventions ?   ? View : No data to display.  ?  ?  ?  ? ? ? ? ? ?

## 2021-06-20 NOTE — Progress Notes (Signed)
Ambulatory O2 saturation 89%. Patient with c/o dyspnea upon exertion. ?

## 2021-06-21 ENCOUNTER — Other Ambulatory Visit (HOSPITAL_COMMUNITY): Payer: Self-pay

## 2021-06-21 DIAGNOSIS — J9601 Acute respiratory failure with hypoxia: Secondary | ICD-10-CM | POA: Diagnosis not present

## 2021-06-21 DIAGNOSIS — E785 Hyperlipidemia, unspecified: Secondary | ICD-10-CM | POA: Diagnosis not present

## 2021-06-21 DIAGNOSIS — I10 Essential (primary) hypertension: Secondary | ICD-10-CM | POA: Diagnosis not present

## 2021-06-21 DIAGNOSIS — J441 Chronic obstructive pulmonary disease with (acute) exacerbation: Secondary | ICD-10-CM | POA: Diagnosis not present

## 2021-06-21 LAB — BASIC METABOLIC PANEL
Anion gap: 8 (ref 5–15)
BUN: 26 mg/dL — ABNORMAL HIGH (ref 8–23)
CO2: 23 mmol/L (ref 22–32)
Calcium: 9.5 mg/dL (ref 8.9–10.3)
Chloride: 107 mmol/L (ref 98–111)
Creatinine, Ser: 0.87 mg/dL (ref 0.61–1.24)
GFR, Estimated: >60 mL/min (ref >60)
Glucose, Bld: 125 mg/dL — ABNORMAL HIGH (ref 70–99)
Potassium: 4 mmol/L (ref 3.5–5.1)
Sodium: 138 mmol/L (ref 135–145)

## 2021-06-21 LAB — MAGNESIUM: Magnesium: 2.2 mg/dL (ref 1.7–2.4)

## 2021-06-21 MED ORDER — PREDNISONE 20 MG PO TABS
ORAL_TABLET | ORAL | 0 refills | Status: AC
Start: 2021-06-22 — End: 2021-06-28
  Filled 2021-06-21: qty 9, 6d supply, fill #0

## 2021-06-21 MED ORDER — PREDNISONE 20 MG PO TABS: 40.0000 mg | ORAL_TABLET | Freq: Every day | ORAL | Status: DC

## 2021-06-21 MED ORDER — IPRATROPIUM-ALBUTEROL 0.5-2.5 (3) MG/3ML IN SOLN
3.0000 mL | Freq: Two times a day (BID) | RESPIRATORY_TRACT | 0 refills | Status: DC
  Filled 2021-06-21: qty 360, 60d supply, fill #0
  Filled 2021-06-21: qty 180, 30d supply, fill #0

## 2021-06-21 MED ORDER — NICOTINE 14 MG/24HR TD PT24
14.0000 mg | MEDICATED_PATCH | Freq: Every day | TRANSDERMAL | 0 refills | Status: DC
  Filled 2021-06-21: qty 28, 28d supply, fill #0

## 2021-06-21 MED ORDER — SPIRIVA HANDIHALER 18 MCG IN CAPS
18.0000 ug | ORAL_CAPSULE | Freq: Every day | RESPIRATORY_TRACT | 1 refills | Status: DC
  Filled 2021-06-21: qty 30, 30d supply, fill #0

## 2021-06-21 MED ORDER — FLUTICASONE FUROATE-VILANTEROL 100-25 MCG/ACT IN AEPB
1.0000 | INHALATION_SPRAY | Freq: Every day | RESPIRATORY_TRACT | 1 refills | Status: DC
  Filled 2021-06-21: qty 60, 60d supply, fill #0

## 2021-06-21 MED ORDER — ALBUTEROL SULFATE HFA 108 (90 BASE) MCG/ACT IN AERS
2.0000 | INHALATION_SPRAY | RESPIRATORY_TRACT | 1 refills | Status: DC | PRN
  Filled 2021-06-21: qty 8.5, 20d supply, fill #0

## 2021-06-21 MED ORDER — DM-GUAIFENESIN ER 30-600 MG PO TB12
2.0000 | ORAL_TABLET | Freq: Two times a day (BID) | ORAL | 0 refills | Status: AC
  Filled 2021-06-21: qty 20, 5d supply, fill #0

## 2021-06-21 MED ORDER — PANTOPRAZOLE SODIUM 40 MG PO TBEC
40.0000 mg | DELAYED_RELEASE_TABLET | Freq: Every day | ORAL | 1 refills | Status: DC
  Filled 2021-06-21: qty 30, 30d supply, fill #0

## 2021-06-21 NOTE — Plan of Care (Signed)
?  Problem: Education: ?Goal: Knowledge of General Education information will improve ?Description: Including pain rating scale, medication(s)/side effects and non-pharmacologic comfort measures ?Outcome: Adequate for Discharge ?  ?Problem: Health Behavior/Discharge Planning: ?Goal: Ability to manage health-related needs will improve ?Outcome: Adequate for Discharge ?  ?Problem: Clinical Measurements: ?Goal: Ability to maintain clinical measurements within normal limits will improve ?Outcome: Adequate for Discharge ?Goal: Will remain free from infection ?Outcome: Adequate for Discharge ?Goal: Diagnostic test results will improve ?Outcome: Adequate for Discharge ?Goal: Respiratory complications will improve ?Outcome: Adequate for Discharge ?Goal: Cardiovascular complication will be avoided ?Outcome: Adequate for Discharge ?  ?Problem: Activity: ?Goal: Risk for activity intolerance will decrease ?Outcome: Adequate for Discharge ?  ?Problem: Nutrition: ?Goal: Adequate nutrition will be maintained ?Outcome: Adequate for Discharge ?  ?Problem: Coping: ?Goal: Level of anxiety will decrease ?Outcome: Adequate for Discharge ?  ?Problem: Elimination: ?Goal: Will not experience complications related to bowel motility ?Outcome: Adequate for Discharge ?Goal: Will not experience complications related to urinary retention ?Outcome: Adequate for Discharge ?  ?Problem: Pain Managment: ?Goal: General experience of comfort will improve ?Outcome: Adequate for Discharge ?  ?Problem: Safety: ?Goal: Ability to remain free from injury will improve ?Outcome: Adequate for Discharge ?  ?Problem: Skin Integrity: ?Goal: Risk for impaired skin integrity will decrease ?Outcome: Adequate for Discharge ?  ?Problem: Malnutrition  (NI-5.2) ?Goal: Food and/or nutrient delivery ?Description: Individualized approach for food/nutrient provision. ?Outcome: Adequate for Discharge ?  ?Problem: Education: ?Goal: Knowledge of General Education information  will improve ?Description: Including pain rating scale, medication(s)/side effects and non-pharmacologic comfort measures ?Outcome: Adequate for Discharge ?  ?Problem: Health Behavior/Discharge Planning: ?Goal: Ability to manage health-related needs will improve ?Outcome: Adequate for Discharge ?  ?Problem: Clinical Measurements: ?Goal: Ability to maintain clinical measurements within normal limits will improve ?Outcome: Adequate for Discharge ?Goal: Will remain free from infection ?Outcome: Adequate for Discharge ?Goal: Diagnostic test results will improve ?Outcome: Adequate for Discharge ?Goal: Respiratory complications will improve ?Outcome: Adequate for Discharge ?Goal: Cardiovascular complication will be avoided ?Outcome: Adequate for Discharge ?  ?Problem: Activity: ?Goal: Risk for activity intolerance will decrease ?Outcome: Adequate for Discharge ?  ?Problem: Nutrition: ?Goal: Adequate nutrition will be maintained ?Outcome: Adequate for Discharge ?  ?Problem: Safety: ?Goal: Ability to remain free from injury will improve ?Outcome: Adequate for Discharge ?  ?

## 2021-06-21 NOTE — TOC Transition Note (Signed)
Transition of Care (TOC) - CM/SW Discharge Note ? ? ?Patient Details  ?Name: Ammaar Encina ?MRN: [id number] ?Date of Birth: 12-20-1955 ? ?Transition of Care (TOC) CM/SW Contact:  ?Cyndi Bender, RN ?Phone Number: ?06/21/2021, 3:27 PM ? ? ?Clinical Narrative:    ?Patient stable for discharge. DME at bedside. Patient has transportation home. ? ? ?Final next level of care: Home/Self Care ?Barriers to Discharge: Barriers Resolved ? ? ?Patient Goals and CMS Choice ?Patient states their goals for this hospitalization and ongoing recovery are:: to go home ?  ?  ? ?Discharge Placement ?  ?           ?  ?  ?  ?  ? ?Discharge Plan and Services ?  ?  ?           ?DME Arranged: Oxygen, Nebulizer machine ?DME Agency: [employer] ?Date DME Agency Contacted: 06/20/21 ?Time DME Agency Contacted: [phone] ?Representative spoke with at DME Agency: Brenton Grills ?  ?  ?  ?  ?  ? ?Social Determinants of Health (SDOH) Interventions ?  ? ? ?Readmission Risk Interventions ? ?  06/21/2021  ?  2:22 PM  ?Readmission Risk Prevention Plan  ?Transportation Screening Complete  ?PCP or Specialist Appt within 5-7 Days Complete  ?Home Care Screening Complete  ?Medication Review (RN CM) Complete  ? ? ? ? ? ?

## 2021-06-21 NOTE — Progress Notes (Signed)
Per pt he can't get anyone here to pick him up. Earlier he said someone will be here by 5 pm to pick him up. Dr Grandville Silos notified. ?

## 2021-06-21 NOTE — Progress Notes (Signed)
The patient is discharged from the hospital and she was accompanied to her ride by NT. No acute distress noted.  ?

## 2021-06-21 NOTE — Progress Notes (Signed)
Patient given discharge instructions and stated understanding.  Patient O2 was delivered and he is waiting on TOC medication.  Patient is not appropriate for D/C lounge due to continuous O2 ?

## 2021-06-21 NOTE — Discharge Summary (Signed)
Physician Discharge Summary  ?Jason Wong DDU:[id number] DOB: [birth year] DOA: 06/14/2021 ? ?PCP: Ladell Pier, MD ? ?Admit date: 06/14/2021 ?Discharge date: 06/21/2021 ? ?Time spent: 55 minutes ? ?Recommendations for Outpatient Follow-up:  ?Follow-up with Ladell Pier, MD in 2 weeks.  On follow-up patient will need a basic metabolic profile done to follow-up on electrolytes and renal function.  Patient's COPD will need to be reassessed.  Patient will likely need repeat ambulatory sats on follow-up. ? ? ?Discharge Diagnoses:  ?Principal Problem: ?  Acute exacerbation of chronic obstructive pulmonary disease (COPD) ([place]) ?Active Problems: ?  Essential hypertension ?  Tobacco dependence ?  COPD exacerbation ([place]) ?  Acute respiratory failure with hypoxia due to COPD exacerbation ?  HLD (hyperlipidemia) ?  Malnutrition of moderate degree ? ? ?Discharge Condition: Stable and improved. ? ?Diet recommendation: Regular ? ?Filed Weights  ? 06/15/21 0500 06/18/21 0500 06/19/21 0454  ?Weight: 57.4 kg 58.3 kg 58.8 kg  ? ? ?History of present illness:  ?HPI per Dr. Velia Meyer ?Jason Wong is a 66 y.o. male with medical history significant for COPD, chronic tobacco abuse, hypertension, hyperlipidemia,  who is admitted to [facility] on 06/14/2021 with acute COPD exacerbation after presenting from home to [facility] ED complaining of shortness of breath.  ?  ?The patient reports 1 to 2 days of progressive shortness of breath associated with new onset nonproductive cough as well as wheezing.  Denies any associated orthopnea, PND, or worsening of peripheral edema.  He also denies any associated chest pain, palpitations, diaphoresis, nausea, vomiting, dizziness, presyncope, or syncope.  Not associated with any hemoptysis, new lower extremity erythema, or calf tenderness.  No recent trauma or travel.  Denies any recent periods of prolonged diminished ambulatory status, nor any recent surgical procedures.  Not associate with any  subjective fever, chills, rigors, or generalized myalgias.  Not associate any rash, abdominal pain, diarrhea..  ?  ?He acknowledges a history of COPD with no known baseline supplemental oxygen requirements.  He conveys that he is a long-term cigarette smoker, having smoked approximately half pack per day for nearly 50 years, and that he continues to smoke this daily rate.  Per chart review, there is also documentation of a history of cocaine use, although the patient denies any recent recreational drug use to me. ?  ?Reports good compliance with the same respiratory regimen which consists of scheduled Breo Ellipta as well as scheduled Spiriva, in addition to prn albuterol inhaler.  Over the last 1 to 2 days, he notes increasing frequency of use of his prn albuterol inhaler, but without any significant improvement in his respiratory status, ultimately prompting him to present to [facility] emergency department today via EMS for further evaluation management of such. ?  ?Of note, he received Solu-Medrol 125 mg IV x1 via EMS in route to the emergency department this evening. ?  ?  ?  ?  ?ED Course:  ?Vital signs in the ED were notable for the following: Afebrile; heart rate 77-96; blood pressure 122/89; respiratory rate 17-22, oxygen saturation initially noted to be 88% on room air, subsequently improving into the range of 92 to 93% on 2 L nasal cannula. ?  ?Labs were notable for the following: BMP notable for the following: Bicarbonate 23, creatinine 0.85.  CBC notable for white blood cell count 9500 with 43% neutrophils, hemoglobin 13.7.  COVID-19/influenza PCR negative. ?  ?Imaging and additional notable ED work-up: EKG, in comparison to most recent prior from 05/10/2021  shows sinus rhythm with heart rate 92, normal intervals, nonspecific T wave inversion in aVL, unchanged from most recent prior EKG, no evidence of ST changes, including no evidence of ST elevation. ?  ?While in the ED, the following were  administered: Duo nebulizer treatment x1, hour-long albuterol nebulizer x1, magnesium sulfate 2 g IV over 2 hours x 1 dose, as well as a 1 L normal saline bolus.  ?  ?Subsequently, the patient was admitted for further evaluation management of acute COPD exacerbation complicated by acute hypoxic respiratory distress.  ?  ?Hospital Course:  ?Acute exacerbation of chronic obstructive pulmonary disease (COPD) ([place]) ?Likely from ongoing tobacco use.  ?BNP negative, procalcitonin negative.  ?-Patient slowly improved however still with some exertional shortness of breath which is slowly improving by day of discharge. ?-Chest tightness and wheezing improved and the wheezing had resolved by day of discharge. ?-Patient was maintained on scheduled DuoNebs, completed course of azithromycin, maintained on Mucinex, Pulmicort and subsequently transition to [facility]. ?-Patient placed on IV Solu-Medrol and slowly tapered down to prednisone. ?-Patient was discharged home on a prednisone taper. ?-Patient received a few doses of Lasix during the hospitalization however still has some hypoxia on exertion. ?-Patient likely to be discharged home with home O2. ?-Outpatient follow-up with PCP. ?  ?HLD (hyperlipidemia) ?Patient was maintained on a statin.  ?  ?   ?  ?Acute respiratory failure with hypoxia due to COPD exacerbation ?Secondary to COPD exacerbation ?COVID/flu-negative ?Patient with desaturations with sats of 89% on room air on ambulation on 06/20/2020.  ?-Patient was treated for acute COPD exacerbation and received a few doses of IV Lasix. ?-Likely be discharged home on home oxygen. ?-See above. ?  ?Tobacco dependence ?Tobacco cessation stressed to patient. ?Nicotine patch placed during the hospitalization. ?  ?  ?Essential hypertension ?-Controlled on home regimen of Norvasc.   ?-Patient also maintained on hydralazine as needed.  ? ?Hypokalemia ?-Likely secondary to diuretics. ?-Repleted. ?  ? ?Procedures: ?Chest x-ray  06/14/2021 ? ?Consultations: ?None ? ?Discharge Exam: ?Vitals:  ? 06/21/21 0817 06/21/21 1235  ?BP: 121/89 138/89  ?Pulse: 79 85  ?Resp: 16 16  ?Temp: (!) 97.3 ?F (36.3 ?C) 98 ?F (36.7 ?C)  ?SpO2: 97% 95%  ? ? ?General: NAD ?Cardiovascular: Regular rate rhythm no murmurs rubs or gallops.  No JVD.  No lower extremity edema. ?Respiratory: Clear to auscultation bilaterally.  Fair air movement.  No wheezing noted.  Speaking in full sentences. ? ?Discharge Instructions ? ? ?Discharge Instructions   ? ? Diet general   Complete by: As directed ?  ? Increase activity slowly   Complete by: As directed ?  ? ?  ? ?Allergies as of 06/21/2021   ? ?   Reactions  ? Tobacco Shortness Of Breath, Other (See Comments)  ? CIGARETTE SMOKE TRIGGERS THE PATIENT'S RESPIRATORY ISSUES!!  ? ?  ? ?  ?Medication List  ?  ? ?STOP taking these medications   ? ?azithromycin 250 MG tablet ?Commonly known as: ZITHROMAX ?  ?naloxone 2 MG/2ML injection ?Commonly known as: NARCAN ?  ?nicotine 7 mg/24hr patch ?Commonly known as: NICODERM CQ - dosed in mg/24 hr ?Replaced by: nicotine 14 mg/24hr patch ?  ? ?  ? ?TAKE these medications   ? ?albuterol 108 (90 Base) MCG/ACT inhaler ?Commonly known as: VENTOLIN HFA ?Inhale 2 puffs into the lungs every 4 (four) hours as needed for wheezing or shortness of breath. ?  ?amLODipine 5 MG tablet ?Commonly known as: NORVASC ?  Take 1 tablet (5 mg total) by mouth daily. ?  ?atorvastatin 10 MG tablet ?Commonly known as: LIPITOR ?Take 1 tablet (10 mg total) by mouth daily. ?  ?dextromethorphan-guaiFENesin 30-600 MG 12hr tablet ?Commonly known as: Corder DM ?Take 2 tablets by mouth 2 (two) times daily for 5 days. ?  ?fluticasone furoate-vilanterol 100-25 MCG/ACT Aepb ?Commonly known as: Breo Ellipta ?Inhale 1 puff into the lungs daily. ?  ?ipratropium-albuterol 0.5-2.5 (3) MG/3ML Soln ?Commonly known as: DUONEB ?Use 1 vial by nebulization in the morning and at bedtime. ?  ?multivitamin with minerals Tabs tablet ?Take 1  tablet by mouth daily. ?  ?nicotine 14 mg/24hr patch ?Commonly known as: NICODERM CQ - dosed in mg/24 hours ?Place 1 patch (14 mg total) onto the skin daily. ?Start taking on: June 22, 2021 ?Replaces: nicotine 7 mg/24hr patch ?  ?

## 2021-06-22 ENCOUNTER — Telehealth: Payer: Self-pay

## 2021-06-22 DIAGNOSIS — J441 Chronic obstructive pulmonary disease with (acute) exacerbation: Secondary | ICD-10-CM | POA: Diagnosis not present

## 2021-06-22 NOTE — Telephone Encounter (Signed)
Transition Care Management Unsuccessful Follow-up Telephone Call ? ?Date of discharge and from where:  06/21/2021, [facility]  ? ?Attempts:  1st Attempt ? ?Reason for unsuccessful TCM follow-up call:  Unable to reach patient - called # [phone] and  ?[phone], both phones just ring, no option to leave a message  ? ? ? ?

## 2021-06-23 ENCOUNTER — Telehealth: Payer: Self-pay

## 2021-06-23 NOTE — Telephone Encounter (Signed)
Transition Care Management Unsuccessful Follow-up Telephone Call ? ?Date of discharge and from where:  06/21/2021, [facility]  ? ?Attempts:  2nd Attempt ? ?Reason for unsuccessful TCM follow-up call:  Unable to reach patient-  called # [phone] and  ?[phone], both phones just ring, no option to leave a message  ?  ?  ? ? ? ? ?

## 2021-06-24 ENCOUNTER — Telehealth: Payer: Self-pay

## 2021-06-24 NOTE — Telephone Encounter (Signed)
Transition Care Management Unsuccessful Follow-up Telephone Call ? ?Date of discharge and from where:  06/21/2021, [facility]  ? ?Attempts:  3rd Attempt ? ?Reason for unsuccessful TCM follow-up call:  Unable to reach patient - called # [phone] and  ?[phone], both phones just ring, no option to leave a message . ? ?Letter sent to patient requesting he contact [place] to schedule a hospital follow up appointment as we have not been able to reach him. ? ? ? ?

## 2021-07-07 ENCOUNTER — Other Ambulatory Visit: Payer: Self-pay

## 2021-07-22 ENCOUNTER — Encounter: Payer: Self-pay | Admitting: Internal Medicine

## 2021-07-22 ENCOUNTER — Other Ambulatory Visit: Payer: Self-pay | Admitting: Pharmacist

## 2021-07-22 ENCOUNTER — Ambulatory Visit: Payer: Medicare Other | Attending: Internal Medicine | Admitting: Pharmacist

## 2021-07-22 ENCOUNTER — Other Ambulatory Visit: Payer: Self-pay

## 2021-07-22 ENCOUNTER — Ambulatory Visit: Payer: Medicare Other | Attending: Internal Medicine | Admitting: Internal Medicine

## 2021-07-22 VITALS — BP 130/82 | HR 76 | Temp 98.1°F | Resp 18 | Wt 131.6 lb

## 2021-07-22 DIAGNOSIS — Z23 Encounter for immunization: Secondary | ICD-10-CM

## 2021-07-22 DIAGNOSIS — Z2821 Immunization not carried out because of patient refusal: Secondary | ICD-10-CM

## 2021-07-22 DIAGNOSIS — I10 Essential (primary) hypertension: Secondary | ICD-10-CM

## 2021-07-22 DIAGNOSIS — Z7189 Other specified counseling: Secondary | ICD-10-CM

## 2021-07-22 DIAGNOSIS — J441 Chronic obstructive pulmonary disease with (acute) exacerbation: Secondary | ICD-10-CM | POA: Diagnosis not present

## 2021-07-22 DIAGNOSIS — Z1211 Encounter for screening for malignant neoplasm of colon: Secondary | ICD-10-CM

## 2021-07-22 DIAGNOSIS — F17200 Nicotine dependence, unspecified, uncomplicated: Secondary | ICD-10-CM

## 2021-07-22 DIAGNOSIS — J432 Centrilobular emphysema: Secondary | ICD-10-CM | POA: Diagnosis not present

## 2021-07-22 DIAGNOSIS — Z122 Encounter for screening for malignant neoplasm of respiratory organs: Secondary | ICD-10-CM

## 2021-07-22 MED ORDER — ALBUTEROL SULFATE HFA 108 (90 BASE) MCG/ACT IN AERS
2.0000 | INHALATION_SPRAY | RESPIRATORY_TRACT | 5 refills | Status: DC | PRN
Start: 2021-07-22 — End: 2021-08-13
  Filled 2021-07-22: qty 8.5, 16d supply, fill #0

## 2021-07-22 MED ORDER — SPIRIVA HANDIHALER 18 MCG IN CAPS
18.0000 ug | ORAL_CAPSULE | Freq: Every day | RESPIRATORY_TRACT | 6 refills | Status: DC
  Filled 2021-07-22: qty 30, 30d supply, fill #0

## 2021-07-22 MED ORDER — FLUTICASONE FUROATE-VILANTEROL 100-25 MCG/ACT IN AEPB
1.0000 | INHALATION_SPRAY | Freq: Every day | RESPIRATORY_TRACT | 6 refills | Status: DC
  Filled 2021-07-22: qty 60, 30d supply, fill #0

## 2021-07-22 MED ORDER — AMLODIPINE BESYLATE 5 MG PO TABS
5.0000 mg | ORAL_TABLET | Freq: Every day | ORAL | 2 refills | Status: DC
  Filled 2021-07-22: qty 90, 90d supply, fill #0

## 2021-07-22 MED ORDER — ZOSTER VAC RECOMB ADJUVANTED 50 MCG/0.5ML IM SUSR: 0.5000 mL | Freq: Once | INTRAMUSCULAR | 0 refills | Status: AC

## 2021-07-22 NOTE — Progress Notes (Signed)
? ? ?Patient ID: Jason Wong, male    DOB: 1956/03/03  MRN: [id number] ? ?CC: Follow-up (HFU) ? ? ?Subjective: ?Jason Wong is a 66 y.o. male who presents for chronic ds management ?His concerns today include:  ?Pt with hx of HTN, tob dep, seasonal allergies, asthma, centrilobular emphysema, insomnia, coronary atherosclerosis, pre-DM, polysub abuse (ETOH, cocaine, drug over dose accidentally 08/2020)  and hep C treated.  ? ?Since last visit with me, patient was hospitalized 3/27-06/21/2021 with hypoxic respiratory failure secondary to COPD exacerbation.  He was discharged with home O2 and was told to continue Breo and Spiriva inhaler. ?Reports he has not been using the O2 at all.  Using Duo-neb treatment BID.  Has Spiriva inhaler but reports he is not using every day.  Does not have Breo.  Thinks he misplaced it. ?Quit smoking since he left the hosp. No using patches. Had smoked since age 59.  Due for repeat low dose CT for lung cancer screen ?Feels his breathing is "so, so."  Endorses coughing but better than when he went to hospital ? ?HTN: He is on Norvasc 5 mg daily.  Took already this a.m.  Not limiting salt in foods as much as he should. ?Not sure if he has any Lipitor left but reports he was taking ? ?HM: He is due for Shingrix No. 2, Pneumovax 23 and repeat colonoscopy with history of colon polyps. ?Patient Active Problem List  ? Diagnosis Date Noted  ? Malnutrition of moderate degree 06/16/2021  ? Acute exacerbation of chronic obstructive pulmonary disease (COPD) ([place]) 06/14/2021  ? ETOH abuse 05/01/2021  ? Acute respiratory failure with hypoxia due to COPD exacerbation 04/30/2021  ? HLD (hyperlipidemia) 04/30/2021  ? COPD (chronic obstructive pulmonary disease) with emphysema ([place]) 02/19/2021  ? COPD exacerbation ([place]) 02/18/2021  ? Memory deficit 04/28/2020  ? Hepatitis C virus infection cured after antiviral drug therapy 04/27/2018  ? Enuresis 04/27/2018  ? Centrilobular emphysema ([place]) 04/27/2018  ?  Seasonal allergic rhinitis 04/27/2018  ? Tobacco dependence 08/24/2017  ? Weight loss, unintentional 08/24/2017  ? Insomnia 06/27/2017  ? Prediabetes 01/24/2017  ? Asthma 06/10/2016  ? Essential hypertension 06/10/2016  ? Current every day smoker 06/10/2016  ?  ? ?Current Outpatient Medications on File Prior to Visit  ?Medication Sig Dispense Refill  ? atorvastatin (LIPITOR) 10 MG tablet Take 1 tablet (10 mg total) by mouth daily. 90 tablet 2  ? ipratropium-albuterol (DUONEB) 0.5-2.5 (3) MG/3ML SOLN Use 1 vial by nebulization in the morning and at bedtime. 360 mL 0  ? Multiple Vitamin (MULTIVITAMIN WITH MINERALS) TABS tablet Take 1 tablet by mouth daily.    ? pantoprazole (PROTONIX) 40 MG tablet Take 1 tablet (40 mg total) by mouth daily at 6 (six) AM. 30 tablet 1  ? ?No current facility-administered medications on file prior to visit.  ? ? ?Allergies  ?Allergen Reactions  ? Tobacco Shortness Of Breath and Other (See Comments)  ?  CIGARETTE SMOKE TRIGGERS THE PATIENT'S RESPIRATORY ISSUES!!  ? ? ?Social History  ? ?Socioeconomic History  ? Marital status: Single  ?  Spouse name: Not on file  ? Number of children: Not on file  ? Years of education: Not on file  ? Highest education level: Not on file  ?Occupational History  ? Not on file  ?Tobacco Use  ? Smoking status: Former  ?  Packs/day: 0.50  ?  Years: 45.00  ?  Pack years: 22.50  ?  Types: Cigarettes  ?  Quit date: 07/17/2021  ?  Years since quitting: 0.0  ? Smokeless tobacco: Never  ?Substance and Sexual Activity  ? Alcohol use: Yes  ?  Alcohol/week: 14.0 standard drinks  ?  Types: 14 Cans of beer per week  ? Drug use: Not Currently  ?  Types: "Crack" cocaine  ? Sexual activity: Yes  ?  Partners: Female  ?Other Topics Concern  ? Not on file  ?Social History Narrative  ? Not on file  ? ?Social Determinants of Health  ? ?Financial Resource Strain: Not on file  ?Food Insecurity: Not on file  ?Transportation Needs: Not on file  ?Physical Activity: Not on file   ?Stress: Not on file  ?Social Connections: Not on file  ?Intimate Partner Violence: Not on file  ? ? ?Family History  ?Problem Relation Age of Onset  ? Hypertension Mother   ? Breast cancer Sister   ? Hypertension Sister   ? Colon cancer Brother   ? Colon polyps Neg Hx   ? Esophageal cancer Neg Hx   ? Rectal cancer Neg Hx   ? Stomach cancer Neg Hx   ? ? ?Past Surgical History:  ?Procedure Laterality Date  ? INCISE AND DRAIN ABCESS    ? dog bite  ? ? ?ROS: ?Review of Systems ?Negative except as stated above ? ?PHYSICAL EXAM: ?BP 130/82   Pulse 76   Temp 98.1 ?F (36.7 ?C) (Oral)   Resp 18   Wt 131 lb 9.6 oz (59.7 kg)   SpO2 96%   BMI 19.43 kg/m?   ?Pox RA walking 1.5 x around nursing station was 94-98%; most of the time it stayed at 95% ?Physical Exam ? ? ?General appearance - alert, well appearing, and in no distress ?Mental status - normal mood, behavior, speech, dress, motor activity, and thought processes ?Mouth - mucous membranes moist, pharynx normal without lesions ?Neck - supple, no significant adenopathy ?Chest -breath sounds slightly decreased but no wheezes or crackles heard. ?Heart - normal rate, regular rhythm, normal S1, S2, no murmurs, rubs, clicks or gallops ?Extremities - peripheral pulses normal, no pedal edema, no clubbing or cyanosis ? ? ?  Latest Ref Rng & Units 06/21/2021  ?  2:14 AM 06/20/2021  ? 12:54 AM 06/19/2021  ? 12:48 AM  ?CMP  ?Glucose 70 - 99 mg/dL 125   157   101    ?BUN 8 - 23 mg/dL '26   21   24    '$ ?Creatinine 0.61 - 1.24 mg/dL 0.87   0.81   0.98    ?Sodium 135 - 145 mmol/L 138   138   140    ?Potassium 3.5 - 5.1 mmol/L 4.0   3.9   3.2    ?Chloride 98 - 111 mmol/L 107   107   106    ?CO2 22 - 32 mmol/L '23   22   25    '$ ?Calcium 8.9 - 10.3 mg/dL 9.5   9.4   9.1    ? ?Lipid Panel  ?   ?Component Value Date/Time  ? CHOL 171 09/06/2019 1053  ? TRIG 57 09/06/2019 1053  ? HDL 70 09/06/2019 1053  ? CHOLHDL 2.4 09/06/2019 1053  ? [place] 90 09/06/2019 1053  ? ? ?CBC ?   ?Component Value  Date/Time  ? WBC 7.2 06/14/2021 0701  ? RBC 3.69 (L) 06/14/2021 0701  ? HGB 12.2 (L) 06/14/2021 2423  ? HGB 17.2 09/06/2019 1053  ? HCT 36.0 (L) 06/14/2021 5361  ?  HCT 49.9 09/06/2019 1053  ? PLT 258 06/14/2021 0701  ? PLT 274 09/06/2019 1053  ? MCV 101.6 (H) 06/14/2021 0701  ? MCV 98 (H) 09/06/2019 1053  ? MCH 33.6 06/14/2021 0701  ? MCHC 33.1 06/14/2021 0701  ? RDW 13.2 06/14/2021 0701  ? RDW 11.5 (L) 09/06/2019 1053  ? LYMPHSABS 0.5 (L) 06/14/2021 0701  ? LYMPHSABS 2.5 06/13/2016 1110  ? MONOABS 0.1 06/14/2021 0701  ? EOSABS 0.0 06/14/2021 0701  ? EOSABS 0.1 06/13/2016 1110  ? BASOSABS 0.0 06/14/2021 0701  ? BASOSABS 0.0 06/13/2016 1110  ? ? ?ASSESSMENT AND PLAN: ?1. Centrilobular emphysema ([place]) ?Encouraged patient to use Spiriva every day as it will improve his breathing.  Also recommend that he use the [place] daily.  Refills sent to his pharmacy.  I had the clinical pharmacist meet with him today to show proper technique with use of the Spiriva. ?-His oxygen level stays above goal with ambulation.  He is not using the home O2 so we will go ahead and have it discontinued. ?- tiotropium (SPIRIVA HANDIHALER) 18 MCG inhalation capsule; Place 1 capsule (18 mcg total) into inhaler and inhale daily.  Dispense: 30 capsule; Refill: 6 ?- fluticasone furoate-vilanterol (BREO ELLIPTA) 100-25 MCG/ACT AEPB; Inhale 1 puff into the lungs daily.  Dispense: 60 each; Refill: 6 ?- albuterol (VENTOLIN HFA) 108 (90 Base) MCG/ACT inhaler; Inhale 2 puffs into the lungs every 4 (four) hours as needed for wheezing or shortness of breath.  Dispense: 8.5 g; Refill: 5 ? ?2. Essential hypertension ?Close to goal.  Continue amlodipine ?- amLODipine (NORVASC) 5 MG tablet; Take 1 tablet (5 mg total) by mouth daily.  Dispense: 90 tablet; Refill: 2 ? ?3. Tobacco dependence ?Commended him on quitting.  Encouraged him to remain tobacco free.  He has greater than 30-pack-year of smoking.  He is due for repeat CT of the chest for lung cancer  screening.  He is agreeable to having this scheduled. ?- CT Chest Wo Contrast; Future ? ?4. Screening for lung cancer ?- CT Chest Wo Contrast; Future ? ?5. Screening for colon cancer ?- Ambulatory referral to Gastroenterology ? ?6.

## 2021-07-22 NOTE — Progress Notes (Signed)
Patient is here for hospital f/u. Patient said that he has stop smoking and doing much better. ?Patient need a refill on medication ?

## 2021-07-22 NOTE — Progress Notes (Signed)
Patient was educated on the use of the Spiriva handihaler. Reviewed necessary supplies and operation of the inhaler. Patient was able to demonstrate use. All questions and concerns were addressed. ? ?Of note, pt is using the same tiotropium capsule for 3-4 uses. I advised him that a new capsule must be used with each inhalation. ? ?Time spent counseling: 15 minutes  ? ?Benard Halsted, PharmD, BCACP, CPP ?Clinical Pharmacist ?[place] ?[phone] ? ?

## 2021-07-26 ENCOUNTER — Telehealth: Payer: Self-pay

## 2021-07-26 NOTE — Telephone Encounter (Signed)
Order received from Dr Wynetta Emery to discontinue patient's home O2.  ?Call placed to [facility], spoke to [place] who confirmed that they have provided patient's O2.  ?Order then faxed to [facility] - [phone]. ?

## 2021-08-11 ENCOUNTER — Telehealth: Payer: Self-pay | Admitting: Internal Medicine

## 2021-08-11 NOTE — Telephone Encounter (Signed)
Copied from [place] [phone]. Topic: General - Other >> Aug 11, 2021 11:30 AM Yvette Rack wrote: Reason for CRM: Pt stated he has not heard from anyone regarding the appt that was made for his heart. Pt requests call back asap.

## 2021-08-11 NOTE — Telephone Encounter (Signed)
Patient aware of message per Dr. Wynetta Emery.  He was unable to write down phone numbers to call to GI and [facility].   Call was transferred to GI specialist so he could call to schedule apt. Informed patient they have attempted to reach him several times.   He will call office back for [facility] and appt.

## 2021-08-12 ENCOUNTER — Emergency Department (HOSPITAL_COMMUNITY): Payer: Medicare Other

## 2021-08-12 ENCOUNTER — Encounter (HOSPITAL_COMMUNITY): Payer: Self-pay

## 2021-08-12 ENCOUNTER — Observation Stay (HOSPITAL_COMMUNITY)
Admission: EM | Admit: 2021-08-12 | Discharge: 2021-08-13 | Disposition: A | Discharge status: Home / Self Care | Payer: Medicare Other | Attending: Internal Medicine | Admitting: Internal Medicine

## 2021-08-12 ENCOUNTER — Other Ambulatory Visit: Payer: Self-pay

## 2021-08-12 DIAGNOSIS — J45909 Unspecified asthma, uncomplicated: Secondary | ICD-10-CM | POA: Insufficient documentation

## 2021-08-12 DIAGNOSIS — Z87891 Personal history of nicotine dependence: Secondary | ICD-10-CM | POA: Diagnosis not present

## 2021-08-12 DIAGNOSIS — Z79899 Other long term (current) drug therapy: Secondary | ICD-10-CM | POA: Insufficient documentation

## 2021-08-12 DIAGNOSIS — J432 Centrilobular emphysema: Secondary | ICD-10-CM

## 2021-08-12 DIAGNOSIS — I10 Essential (primary) hypertension: Secondary | ICD-10-CM | POA: Insufficient documentation

## 2021-08-12 DIAGNOSIS — J441 Chronic obstructive pulmonary disease with (acute) exacerbation: Secondary | ICD-10-CM | POA: Diagnosis not present

## 2021-08-12 DIAGNOSIS — R0602 Shortness of breath: Secondary | ICD-10-CM | POA: Diagnosis present

## 2021-08-12 DIAGNOSIS — E7800 Pure hypercholesterolemia, unspecified: Secondary | ICD-10-CM

## 2021-08-12 LAB — COMPREHENSIVE METABOLIC PANEL
ALT: 19 U/L (ref 0–44)
AST: 28 U/L (ref 15–41)
Albumin: 4.4 g/dL (ref 3.5–5.0)
Alkaline Phosphatase: 65 U/L (ref 38–126)
Anion gap: 8 (ref 5–15)
BUN: 9 mg/dL (ref 8–23)
CO2: 26 mmol/L (ref 22–32)
Calcium: 9.8 mg/dL (ref 8.9–10.3)
Chloride: 111 mmol/L (ref 98–111)
Creatinine, Ser: 1.03 mg/dL (ref 0.61–1.24)
GFR, Estimated: >60 mL/min (ref >60)
Glucose, Bld: 110 mg/dL — ABNORMAL HIGH (ref 70–99)
Potassium: 3.9 mmol/L (ref 3.5–5.1)
Sodium: 145 mmol/L (ref 135–145)
Total Bilirubin: 1.2 mg/dL (ref 0.3–1.2)
Total Protein: 7.6 g/dL (ref 6.5–8.1)

## 2021-08-12 LAB — CBC WITH DIFFERENTIAL/PLATELET
Abs Immature Granulocytes: 0.01 10*3/uL (ref 0.00–0.07)
Basophils Absolute: 0.1 10*3/uL (ref 0.0–0.1)
Basophils Relative: 2 %
Eosinophils Absolute: 1.1 10*3/uL — ABNORMAL HIGH (ref 0.0–0.5)
Eosinophils Relative: 17 %
HCT: 41.6 % (ref 39.0–52.0)
Hemoglobin: 13.7 g/dL (ref 13.0–17.0)
Immature Granulocytes: 0 %
Lymphocytes Relative: 40 %
Lymphs Abs: 2.6 10*3/uL (ref 0.7–4.0)
MCH: 33.1 pg (ref 26.0–34.0)
MCHC: 32.9 g/dL (ref 30.0–36.0)
MCV: 100.5 fL — ABNORMAL HIGH (ref 80.0–100.0)
Monocytes Absolute: 0.4 10*3/uL (ref 0.1–1.0)
Monocytes Relative: 7 %
Neutro Abs: 2.1 10*3/uL (ref 1.7–7.7)
Neutrophils Relative %: 34 %
Platelets: 291 10*3/uL (ref 150–400)
RBC: 4.14 MIL/uL — ABNORMAL LOW (ref 4.22–5.81)
RDW: 13.2 % (ref 11.5–15.5)
WBC: 6.3 10*3/uL (ref 4.0–10.5)
nRBC: 0 % (ref 0.0–0.2)

## 2021-08-12 LAB — TROPONIN I (HIGH SENSITIVITY)
Troponin I (High Sensitivity): 4 ng/L (ref <18)
Troponin I (High Sensitivity): 4 ng/L (ref <18)

## 2021-08-12 MED ORDER — ACETAMINOPHEN 325 MG PO TABS
650.0000 mg | ORAL_TABLET | Freq: Four times a day (QID) | ORAL | Status: DC | PRN
Start: 2021-08-12 — End: 2021-08-13
  Administered 2021-08-12: 650 mg via ORAL
  Filled 2021-08-12: qty 2

## 2021-08-12 MED ORDER — ATORVASTATIN CALCIUM 10 MG PO TABS
10.0000 mg | ORAL_TABLET | Freq: Every day | ORAL | Status: DC
Start: 2021-08-13 — End: 2021-08-13
  Administered 2021-08-13: 10 mg via ORAL
  Filled 2021-08-12: qty 1

## 2021-08-12 MED ORDER — METHYLPREDNISOLONE SODIUM SUCC 125 MG IJ SOLR
125.0000 mg | Freq: Once | INTRAMUSCULAR | Status: AC
  Administered 2021-08-12: 125 mg via INTRAVENOUS
  Filled 2021-08-12: qty 2

## 2021-08-12 MED ORDER — METHYLPREDNISOLONE SODIUM SUCC 40 MG IJ SOLR
40.0000 mg | Freq: Two times a day (BID) | INTRAMUSCULAR | Status: DC
  Administered 2021-08-12: 40 mg via INTRAVENOUS
  Filled 2021-08-12: qty 1

## 2021-08-12 MED ORDER — LORAZEPAM 2 MG/ML IJ SOLN: 1.0000 mg | INTRAMUSCULAR | Status: DC | PRN

## 2021-08-12 MED ORDER — FOLIC ACID 1 MG PO TABS
1.0000 mg | ORAL_TABLET | Freq: Every day | ORAL | Status: DC
  Administered 2021-08-12 – 2021-08-13 (×2): 1 mg via ORAL
  Filled 2021-08-12 (×2): qty 1

## 2021-08-12 MED ORDER — SODIUM CHLORIDE 0.9 % IV SOLN
500.0000 mg | INTRAVENOUS | Status: AC
  Administered 2021-08-12: 500 mg via INTRAVENOUS
  Filled 2021-08-12: qty 5

## 2021-08-12 MED ORDER — POLYETHYLENE GLYCOL 3350 17 G PO PACK: 17.0000 g | PACK | Freq: Every day | ORAL | Status: DC | PRN

## 2021-08-12 MED ORDER — LORAZEPAM 1 MG PO TABS
1.0000 mg | ORAL_TABLET | ORAL | Status: DC | PRN
  Administered 2021-08-12: 1 mg via ORAL
  Filled 2021-08-12: qty 1

## 2021-08-12 MED ORDER — THIAMINE HCL 100 MG PO TABS
100.0000 mg | ORAL_TABLET | Freq: Every day | ORAL | Status: DC
  Administered 2021-08-12 – 2021-08-13 (×2): 100 mg via ORAL
  Filled 2021-08-12 (×2): qty 1

## 2021-08-12 MED ORDER — ALBUTEROL SULFATE (2.5 MG/3ML) 0.083% IN NEBU: 2.5000 mg | INHALATION_SOLUTION | RESPIRATORY_TRACT | Status: DC | PRN

## 2021-08-12 MED ORDER — IPRATROPIUM-ALBUTEROL 0.5-2.5 (3) MG/3ML IN SOLN
3.0000 mL | Freq: Four times a day (QID) | RESPIRATORY_TRACT | Status: DC
  Administered 2021-08-12 – 2021-08-13 (×3): 3 mL via RESPIRATORY_TRACT
  Filled 2021-08-12 (×2): qty 3

## 2021-08-12 MED ORDER — THIAMINE HCL 100 MG/ML IJ SOLN: 100.0000 mg | Freq: Every day | INTRAMUSCULAR | Status: DC

## 2021-08-12 MED ORDER — AZITHROMYCIN 500 MG PO TABS: 500.0000 mg | ORAL_TABLET | Freq: Every day | ORAL | Status: DC

## 2021-08-12 MED ORDER — ONDANSETRON HCL 4 MG PO TABS: 4.0000 mg | ORAL_TABLET | Freq: Four times a day (QID) | ORAL | Status: DC | PRN

## 2021-08-12 MED ORDER — AMLODIPINE BESYLATE 5 MG PO TABS
5.0000 mg | ORAL_TABLET | Freq: Every day | ORAL | Status: DC
  Administered 2021-08-13: 5 mg via ORAL
  Filled 2021-08-12: qty 1

## 2021-08-12 MED ORDER — IPRATROPIUM-ALBUTEROL 0.5-2.5 (3) MG/3ML IN SOLN
3.0000 mL | RESPIRATORY_TRACT | Status: AC
  Administered 2021-08-12 (×3): 3 mL via RESPIRATORY_TRACT
  Filled 2021-08-12 (×3): qty 3

## 2021-08-12 MED ORDER — BUDESONIDE 0.5 MG/2ML IN SUSP
0.5000 mg | Freq: Two times a day (BID) | RESPIRATORY_TRACT | Status: DC
  Administered 2021-08-12 – 2021-08-13 (×2): 0.5 mg via RESPIRATORY_TRACT
  Filled 2021-08-12 (×2): qty 2

## 2021-08-12 MED ORDER — ENOXAPARIN SODIUM 40 MG/0.4ML IJ SOSY
40.0000 mg | PREFILLED_SYRINGE | INTRAMUSCULAR | Status: DC
  Administered 2021-08-12: 40 mg via SUBCUTANEOUS
  Filled 2021-08-12: qty 0.4

## 2021-08-12 MED ORDER — GUAIFENESIN ER 600 MG PO TB12
600.0000 mg | ORAL_TABLET | Freq: Two times a day (BID) | ORAL | Status: DC
  Administered 2021-08-13: 600 mg via ORAL
  Filled 2021-08-12 (×2): qty 1

## 2021-08-12 MED ORDER — PREDNISONE 20 MG PO TABS: 40.0000 mg | ORAL_TABLET | Freq: Every day | ORAL | Status: DC

## 2021-08-12 MED ORDER — ONDANSETRON HCL 4 MG/2ML IJ SOLN: 4.0000 mg | Freq: Four times a day (QID) | INTRAMUSCULAR | Status: DC | PRN

## 2021-08-12 MED ORDER — MAGNESIUM SULFATE 2 GM/50ML IV SOLN
2.0000 g | Freq: Once | INTRAVENOUS | Status: AC
  Administered 2021-08-12: 2 g via INTRAVENOUS
  Filled 2021-08-12: qty 50

## 2021-08-12 MED ORDER — ADULT MULTIVITAMIN W/MINERALS CH
1.0000 | ORAL_TABLET | Freq: Every day | ORAL | Status: DC
  Administered 2021-08-12 – 2021-08-13 (×2): 1 via ORAL
  Filled 2021-08-12 (×2): qty 1

## 2021-08-12 MED ORDER — PANTOPRAZOLE SODIUM 40 MG PO TBEC
40.0000 mg | DELAYED_RELEASE_TABLET | Freq: Every day | ORAL | Status: DC
  Administered 2021-08-13: 40 mg via ORAL
  Filled 2021-08-12: qty 1

## 2021-08-12 MED ORDER — ARFORMOTEROL TARTRATE 15 MCG/2ML IN NEBU
15.0000 ug | INHALATION_SOLUTION | Freq: Two times a day (BID) | RESPIRATORY_TRACT | Status: DC
  Administered 2021-08-12 – 2021-08-13 (×2): 15 ug via RESPIRATORY_TRACT
  Filled 2021-08-12 (×2): qty 2

## 2021-08-12 MED ORDER — IOHEXOL 350 MG/ML SOLN
100.0000 mL | Freq: Once | INTRAVENOUS | Status: AC | PRN
  Administered 2021-08-12: 70 mL via INTRAVENOUS

## 2021-08-12 MED ORDER — ACETAMINOPHEN 650 MG RE SUPP: 650.0000 mg | Freq: Four times a day (QID) | RECTAL | Status: DC | PRN

## 2021-08-12 NOTE — ED Provider Notes (Signed)
[facility] EMERGENCY DEPARTMENT Provider Note   CSN: [id number] Arrival date & time: 08/12/21  1062     History  Chief Complaint  Patient presents with   Shortness of Breath    Jason Wong is a 66 y.o. male.  Patient with history of COPD, asthma, tobacco use, EtOH use, hypertension, and hepatitis C presents today with complaints of shortness of breath and chest pain. He states that same began suddenly on Monday and has been worsening since then. He states that his chest pain is left sided in nature and does not radiate. States that he has been attempting to manage his symptoms with home nebs without success. Of note, patient has required admission for COPD exacerbation several times so far this year. However, patient states that this feels different to him as he has not had chest pain with these previous exacerbations. He also states that since his last admission he has stopped smoking. Patient denies any fevers, chills, hemoptysis, leg pain, or leg swelling.   The history is provided by the patient. No language interpreter was used.  Shortness of Breath Associated symptoms: chest pain and wheezing   Associated symptoms: no cough and no fever       Home Medications Prior to Admission medications   Medication Sig Start Date End Date Taking? Authorizing Provider  albuterol (VENTOLIN HFA) 108 (90 Base) MCG/ACT inhaler Inhale 2 puffs into the lungs every 4 (four) hours as needed for wheezing or shortness of breath. 07/22/21   Ladell Pier, MD  amLODipine (NORVASC) 5 MG tablet Take 1 tablet (5 mg total) by mouth daily. 07/22/21   Ladell Pier, MD  atorvastatin (LIPITOR) 10 MG tablet Take 1 tablet (10 mg total) by mouth daily. 03/23/21   Ladell Pier, MD  fluticasone furoate-vilanterol (BREO ELLIPTA) 100-25 MCG/ACT AEPB Inhale 1 puff into the lungs daily. 07/22/21   Ladell Pier, MD  ipratropium-albuterol (DUONEB) 0.5-2.5 (3) MG/3ML SOLN Use 1 vial by  nebulization in the morning and at bedtime. 06/21/21   Eugenie Filler, MD  Multiple Vitamin (MULTIVITAMIN WITH MINERALS) TABS tablet Take 1 tablet by mouth daily.    [provider]  pantoprazole (PROTONIX) 40 MG tablet Take 1 tablet (40 mg total) by mouth daily at 6 (six) AM. 06/22/21   Eugenie Filler, MD  tiotropium (SPIRIVA HANDIHALER) 18 MCG inhalation capsule Place 1 capsule (18 mcg total) into inhaler and inhale daily. 07/22/21   Ladell Pier, MD      Allergies    Tobacco    Review of Systems   Review of Systems  Constitutional:  Negative for chills and fever.  Respiratory:  Positive for shortness of breath and wheezing. Negative for cough and stridor.   Cardiovascular:  Positive for chest pain. Negative for palpitations and leg swelling.  All other systems reviewed and are negative.  Physical Exam Updated Vital Signs BP (!) 134/97   Pulse 80   Temp (!) 97.5 F (36.4 C) (Oral)   Resp (!) 26   Ht '5\' 7"'$  (1.702 m)   Wt 59.9 kg   SpO2 96%   BMI 20.67 kg/m  Physical Exam Vitals and nursing note reviewed.  Constitutional:      General: He is not in acute distress.    Appearance: Normal appearance. He is normal weight. He is not ill-appearing, toxic-appearing or diaphoretic.  HENT:     Head: Normocephalic and atraumatic.  Cardiovascular:     Rate and Rhythm:  Normal rate and regular rhythm.     Pulses: Normal pulses.     Heart sounds: Normal heart sounds.  Pulmonary:     Effort: Pulmonary effort is normal. No respiratory distress.     Breath sounds: Wheezing present.  Abdominal:     Palpations: Abdomen is soft.  Musculoskeletal:        General: Normal range of motion.     Cervical back: Normal range of motion.     Right lower leg: No tenderness. No edema.     Left lower leg: No tenderness. No edema.  Skin:    General: Skin is warm and dry.  Neurological:     General: No focal deficit present.     Mental Status: He is alert.  Psychiatric:         Mood and Affect: Mood normal.        Behavior: Behavior normal.    ED Results / Procedures / Treatments   Labs (all labs ordered are listed, but only abnormal results are displayed) Labs Reviewed  CBC WITH DIFFERENTIAL/PLATELET - Abnormal; Notable for the following components:      Result Value   RBC 4.14 (*)    MCV 100.5 (*)    Eosinophils Absolute 1.1 (*)    All other components within normal limits  COMPREHENSIVE METABOLIC PANEL - Abnormal; Notable for the following components:   Glucose, Bld 110 (*)    All other components within normal limits  TROPONIN I (HIGH SENSITIVITY)  TROPONIN I (HIGH SENSITIVITY)    EKG EKG Interpretation  Date/Time:  Thursday Aug 12 2021 09:23:33 EDT Ventricular Rate:  91 PR Interval:  138 QRS Duration: 74 QT Interval:  354 QTC Calculation: 435 R Axis:   84 Text Interpretation: Normal sinus rhythm Right atrial enlargement Nonspecific ST abnormality Abnormal ECG When compared with ECG of 16-Jun-2021 08:41, No significant change since last tracing Confirmed by Dorie Rank [phone]) on 08/12/2021 1:35:08 PM  Radiology DG Chest 2 View  Result Date: 08/12/2021 CLINICAL DATA:  shortness of breath EXAM: CHEST - 2 VIEW COMPARISON:  Chest radiograph June 14, 2021 FINDINGS: No consolidation. No visible pleural effusions or pneumothorax. Cardiomediastinal silhouette is within normal limits. No displaced fracture. IMPRESSION: No evidence of acute cardiopulmonary disease. Electronically Signed   By: Margaretha Sheffield M.D.   On: 08/12/2021 10:23   CT Angio Chest PE W and/or Wo Contrast  Result Date: 08/12/2021 CLINICAL DATA:  Concern for pulmonary embolism. EXAM: CT ANGIOGRAPHY CHEST WITH CONTRAST TECHNIQUE: Multidetector CT imaging of the chest was performed using the standard protocol during bolus administration of intravenous contrast. Multiplanar CT image reconstructions and MIPs were obtained to evaluate the vascular anatomy. RADIATION DOSE REDUCTION: This  exam was performed according to the departmental dose-optimization program which includes automated exposure control, adjustment of the mA and/or kV according to patient size and/or use of iterative reconstruction technique. CONTRAST:  57m OMNIPAQUE IOHEXOL 350 MG/ML SOLN COMPARISON:  None Available. FINDINGS: Cardiovascular: No filling defects within the pulmonary arteries to suggest acute pulmonary embolism. Mediastinum/Nodes: No axillary or supraclavicular adenopathy. No mediastinal or hilar adenopathy. No pericardial fluid. Esophagus normal. Lungs/Pleura: No suspicious pulmonary nodules. Normal pleural. Airways normal. Centrilobular emphysema the upper lobes. Upper Abdomen: Limited view of the liver, kidneys, pancreas are unremarkable. Normal adrenal glands. Musculoskeletal: No aggressive osseous lesion. Review of the MIP images confirms the above findings. IMPRESSION: 1. No evidence acute pulmonary embolism. 2. No acute pulmonary parenchymal findings. 3. Centrilobular emphysema the upper lobes  (ICD10-J43.9).  Electronically Signed   By: Suzy Bouchard M.D.   On: 08/12/2021 13:18    Procedures Procedures    Medications Ordered in ED Medications  magnesium sulfate IVPB 2 g 50 mL (has no administration in time range)  ipratropium-albuterol (DUONEB) 0.5-2.5 (3) MG/3ML nebulizer solution 3 mL (3 mLs Nebulization Given 08/12/21 1042)  methylPREDNISolone sodium succinate (SOLU-MEDROL) 125 mg/2 mL injection 125 mg (125 mg Intravenous Given 08/12/21 1003)  iohexol (OMNIPAQUE) 350 MG/ML injection 100 mL (70 mLs Intravenous Contrast Given 08/12/21 1302)    ED Course/ Medical Decision Making/ A&P                           Medical Decision Making Amount and/or Complexity of Data Reviewed Labs: ordered. Radiology: ordered.  Risk Prescription drug management.   This patient presents to the ED for concern of chest pain, shortness of breath, this involves an extensive number of treatment options, and  is a complaint that carries with it a high risk of complications and morbidity.  The differential diagnosis includes COPD, PE, ACS. This is not an exhaustive differential   Co morbidities that complicate the patient evaluation  Hx COPD   Additional history obtained:  Additional history obtained from previous ER admission notes  Lab Tests:  I Ordered, and personally interpreted labs.  The pertinent results include:  no acute laboratory findings   Imaging Studies ordered:  I ordered imaging studies including CXR, CTPE  I independently visualized and interpreted imaging which showed  CXR: NAD CTPE: no PE or other acute pulmonary findings I agree with the radiologist interpretation   Cardiac Monitoring: / EKG:  The patient was maintained on a cardiac monitor.  I personally viewed and interpreted the cardiac monitored which showed an underlying rhythm of: no STEMI   Problem List / ED Course / Critical interventions / Medication management  I ordered medication including duonebs x 3, solumedrol, magnesium  for shortness of breath  Reevaluation of the patient after these medicines showed that the patient improved I have reviewed the patients home medicines and have made adjustments as needed   Test / Admission - Considered:  Patient presents today with complaints of shortness of breath.  He is afebrile, nontoxic-appearing, and in no acute distress with reassuring vital signs. Patient is currently satting 95% on room air. Wheezing improved with duonebs and Solu-medrol, however patient states that he still does not feel back to baseline. Ambulated in the hallway without desatting, however he states that he was short of breath and tachypneic upon ambulation. Given this with his significant history of COPD exacerbation requiring admission, will seek admission for same. Of note, patient did state that he was having some chest pain originally, however he states that this has improved.  Patient is requesting admission.  Discussed patient with hospitalist who agrees to admit.   This is a shared visit with supervising physician Dr. Tomi Bamberger who has independently evaluated patient & provided guidance in evaluation/management/disposition, in agreement with care    Final Clinical Impression(s) / ED Diagnoses Final diagnoses:  COPD exacerbation [facility])    Rx / DC Orders ED Discharge Orders     None         [name] 08/12/21 1423    Dorie Rank, MD 08/13/21 [phone]

## 2021-08-12 NOTE — ED Notes (Signed)
Patient transported to X-ray 

## 2021-08-12 NOTE — ED Notes (Signed)
Pt ambulated on RA with NT & stayed at 97% the whole time.

## 2021-08-12 NOTE — H&P (Addendum)
History and Physical    Jason Wong QPR:[id number] DOB: 27-Nov-1955 DOA: 08/12/2021  PCP: Ladell Pier, MD  Patient coming from: Home via [facility]  I have personally briefly reviewed patient's old medical records in [place]  Chief Complaint: Shortness of breath  HPI:   Jason Wong is a 66 y.o. male with past medical history significant for COPD, HTN, HLD, GERD, EtOH abuse, history of prior tobacco abuse who presented to [facility] ED 5/25 via POV with progressive shortness of breath.  Patient reports onset Monday.  Also associated with nonproductive cough.  He reports out of his home inhalers for 1 week but does utilize a home nebulizer as needed without any significant improvement.  He also reports recently received a vaccination last week, believes it is for shingles.  Patient also reported some chest discomfort which has self resolved.  In the ED, temperature 97.5 F, HR 86, RR 26, BP 04/16/1993, SPO2 96% on room air.  WBC 6.3, hemoglobin 13.7, platelet count.  Sodium 145, potassium 3.9, chloride 111, CO2 26, glucose 110, BUN 9, creatinine 1.03.  Has Maggie Font and troponin 4, 4.  Chest x-ray with no acute cardiopulmonary disease process.  CT angiogram chest negative for PE, no pulmonary parenchymal findings, positive central lobar emphysema upper lobes.  EKG with normal sinus rhythm, RAE, no dynamic changes.  Patient received DuoNebs x3, IV Solu-Medrol, IV magnesium by EDP.  Patient continued with significant shortness of breath upon treatment in the ED and EDP requested admission for COPD exacerbation.  Hospitalist consulted for admission for COPD exacerbation likely secondary to noncompliance with home inhaler regimen as its been out for 1 week.   Constitutional - No Fatigue, No Weight Loss Vision - No impaired vision, decreased visual acuity Ear/Nose/Mouth/Throat - No decreased hearing, no congestion Respiratory - + shortness of breath, + exertional dyspnea, + nonproductive  cough Cardiovascular - + chest pain but self resolved, no palpitations, no peripheral edema Gastrointestinal - No nausea, no diarrhea, no constipation, Genitourinary - No excessive urination, no urinary incontinence Integumentary - No rashes or concerning skin lesions Neurologic - No numbness, no tingling, no dizziness, no headaches, no confusion or memory loss   Past Medical History:  Diagnosis Date   Asthma    COPD (chronic obstructive pulmonary disease) (HCC)    Hepatitis C antibody positive in blood    HLD (hyperlipidemia)    HTN (hypertension)     Past Surgical History:  Procedure Laterality Date   INCISE AND DRAIN ABCESS     dog bite     reports that he quit smoking about 3 weeks ago. His smoking use included cigarettes. He has a 22.50 pack-year smoking history. He has never used smokeless tobacco. He reports current alcohol use of about 14.0 standard drinks per week. He reports that he does not currently use drugs after having used the following drugs: "Crack" cocaine.  Allergies  Allergen Reactions   Tobacco Shortness Of Breath and Other (See Comments)    CIGARETTE SMOKE TRIGGERS THE PATIENT'S RESPIRATORY ISSUES!!    Family History  Problem Relation Age of Onset   Hypertension Mother    Breast cancer Sister    Hypertension Sister    Colon cancer Brother    Colon polyps Neg Hx    Esophageal cancer Neg Hx    Rectal cancer Neg Hx    Stomach cancer Neg Hx     Family history reviewed and not pertinent   Prior to Admission medications   Medication Sig  Start Date End Date Taking? Authorizing Provider  albuterol (VENTOLIN HFA) 108 (90 Base) MCG/ACT inhaler Inhale 2 puffs into the lungs every 4 (four) hours as needed for wheezing or shortness of breath. 07/22/21   Ladell Pier, MD  amLODipine (NORVASC) 5 MG tablet Take 1 tablet (5 mg total) by mouth daily. 07/22/21   Ladell Pier, MD  atorvastatin (LIPITOR) 10 MG tablet Take 1 tablet (10 mg total) by mouth  daily. 03/23/21   Ladell Pier, MD  fluticasone furoate-vilanterol (BREO ELLIPTA) 100-25 MCG/ACT AEPB Inhale 1 puff into the lungs daily. 07/22/21   Ladell Pier, MD  ipratropium-albuterol (DUONEB) 0.5-2.5 (3) MG/3ML SOLN Use 1 vial by nebulization in the morning and at bedtime. 06/21/21   Eugenie Filler, MD  Multiple Vitamin (MULTIVITAMIN WITH MINERALS) TABS tablet Take 1 tablet by mouth daily.    [provider]  pantoprazole (PROTONIX) 40 MG tablet Take 1 tablet (40 mg total) by mouth daily at 6 (six) AM. 06/22/21   Eugenie Filler, MD  tiotropium (SPIRIVA HANDIHALER) 18 MCG inhalation capsule Place 1 capsule (18 mcg total) into inhaler and inhale daily. 07/22/21   Ladell Pier, MD    Physical Exam: Vitals:   08/12/21 1100 08/12/21 1130 08/12/21 1200 08/12/21 1230  BP: (!) 136/92 118/87 (!) 133/92 117/88  Pulse: 82 84 89 84  Resp: '20 12 16 20  '$ Temp:      TempSrc:      SpO2: 98% 94% 94% 95%  Weight:      Height:        Physical Exam: GEN: NAD, alert and oriented x 3, chronically ill appearance, appears older than stated age HEENT: NCAT, PERRL, EOMI, sclera clear, MMM PULM: Diffuse wheezing throughout all lung fields with slightly increased respiratory effort, no accessory muscle use, no crackles, on room air at rest CV: RRR w/o M/G/R GI: abd soft, NTND, NABS, no R/G/M MSK: Noted left hand second digit amputation, no peripheral edema, muscle strength globally intact 5/5 bilateral upper/lower extremities NEURO: CN II-XII intact, no focal deficits, sensation to light touch intact PSYCH: normal mood/affect Integumentary: Chronic skin changes bilateral lower extremities, no concerning rashes/lesions/wounds   Labs on Admission: I have personally reviewed following labs and imaging studies  CBC: Recent Labs  Lab 08/12/21 0951  WBC 6.3  NEUTROABS 2.1  HGB 13.7  HCT 41.6  MCV 100.5*  PLT 505   Basic Metabolic Panel: Recent Labs  Lab 08/12/21 0951  NA  145  K 3.9  CL 111  CO2 26  GLUCOSE 110*  BUN 9  CREATININE 1.03  CALCIUM 9.8   GFR: Estimated Creatinine Clearance: 59.8 mL/min (by C-G formula based on SCr of 1.03 mg/dL). Liver Function Tests: Recent Labs  Lab 08/12/21 0951  AST 28  ALT 19  ALKPHOS 65  BILITOT 1.2  PROT 7.6  ALBUMIN 4.4   No results for input(s): LIPASE, AMYLASE in the last 168 hours. No results for input(s): AMMONIA in the last 168 hours. Coagulation Profile: No results for input(s): INR, PROTIME in the last 168 hours. Cardiac Enzymes: No results for input(s): CKTOTAL, CKMB, CKMBINDEX, TROPONINI in the last 168 hours. BNP (last 3 results) No results for input(s): PROBNP in the last 8760 hours. HbA1C: No results for input(s): HGBA1C in the last 72 hours. CBG: No results for input(s): GLUCAP in the last 168 hours. Lipid Profile: No results for input(s): CHOL, HDL, LDLCALC, TRIG, CHOLHDL, LDLDIRECT in the last 72 hours. Thyroid  Function Tests: No results for input(s): TSH, T4TOTAL, FREET4, T3FREE, THYROIDAB in the last 72 hours. Anemia Panel: No results for input(s): VITAMINB12, FOLATE, FERRITIN, TIBC, IRON, RETICCTPCT in the last 72 hours. Urine analysis:    Component Value Date/Time   COLORURINE YELLOW 06/14/2021 0715   APPEARANCEUR CLEAR 06/14/2021 0715   LABSPEC 1.012 06/14/2021 0715   PHURINE 5.0 06/14/2021 0715   GLUCOSEU NEGATIVE 06/14/2021 0715   HGBUR NEGATIVE 06/14/2021 0715   BILIRUBINUR NEGATIVE 06/14/2021 0715   KETONESUR NEGATIVE 06/14/2021 0715   PROTEINUR NEGATIVE 06/14/2021 0715   NITRITE NEGATIVE 06/14/2021 0715   LEUKOCYTESUR NEGATIVE 06/14/2021 0715    Radiological Exams on Admission: DG Chest 2 View  Result Date: 08/12/2021 CLINICAL DATA:  shortness of breath EXAM: CHEST - 2 VIEW COMPARISON:  Chest radiograph June 14, 2021 FINDINGS: No consolidation. No visible pleural effusions or pneumothorax. Cardiomediastinal silhouette is within normal limits. No displaced  fracture. IMPRESSION: No evidence of acute cardiopulmonary disease. Electronically Signed   By: Margaretha Sheffield M.D.   On: 08/12/2021 10:23   CT Angio Chest PE W and/or Wo Contrast  Result Date: 08/12/2021 CLINICAL DATA:  Concern for pulmonary embolism. EXAM: CT ANGIOGRAPHY CHEST WITH CONTRAST TECHNIQUE: Multidetector CT imaging of the chest was performed using the standard protocol during bolus administration of intravenous contrast. Multiplanar CT image reconstructions and MIPs were obtained to evaluate the vascular anatomy. RADIATION DOSE REDUCTION: This exam was performed according to the departmental dose-optimization program which includes automated exposure control, adjustment of the mA and/or kV according to patient size and/or use of iterative reconstruction technique. CONTRAST:  61m OMNIPAQUE IOHEXOL 350 MG/ML SOLN COMPARISON:  None Available. FINDINGS: Cardiovascular: No filling defects within the pulmonary arteries to suggest acute pulmonary embolism. Mediastinum/Nodes: No axillary or supraclavicular adenopathy. No mediastinal or hilar adenopathy. No pericardial fluid. Esophagus normal. Lungs/Pleura: No suspicious pulmonary nodules. Normal pleural. Airways normal. Centrilobular emphysema the upper lobes. Upper Abdomen: Limited view of the liver, kidneys, pancreas are unremarkable. Normal adrenal glands. Musculoskeletal: No aggressive osseous lesion. Review of the MIP images confirms the above findings. IMPRESSION: 1. No evidence acute pulmonary embolism. 2. No acute pulmonary parenchymal findings. 3. Centrilobular emphysema the upper lobes  (ICD10-J43.9). Electronically Signed   By: SSuzy BouchardM.D.   On: 08/12/2021 13:18    EKG: Independently reviewed.   Assessment/Plan  Acute COPD exacerbation Patient presenting with progressive shortness of breath since Monday.  Has been out of his home inhalers for roughly 1 week.  Home regimen includes Spiriva, Breo Ellipta, albuterol MDI as  needed and DuoNebs as needed.  Reports nonproductive cough.  Patient is afebrile without leukocytosis.  CT angiogram chest negative for PE, no pulmonary parenchymal disease/findings, noted central lobar emphysema upper lobes.  Etiology likely secondary to noncompliance with outpatient regimen.  Not hypoxic at rest or during ambulation but patient with slightly increased work of breathing. --Admit to observation status, medical/surgical bed --Brovana neb twice daily --Pulmicort neb twice daily --DuoNeb scheduled q6h --Solu-Medrol 40 mg IV q12h x 1 day; followed by prednisone 40 mg p.o. daily --Azithromycin --Albuterol neb q2h PRN shortness of breath/wheezing --Mucinex twice daily --Incentive spirometry  Essential hypertension --Continue home amlodipine 5 mg p.o. daily.  Hyperlipidemia --Continue atorvastatin 10 mg p.o. daily  GERD --Protonix 40 mg p.o. daily  EtOH use disorder Patient reports fairly frequent alcohol use with beer and occasional shots.  Does not quantify.  Denies any history of withdrawal symptoms. --CIWAA protocol with symptom triggered Ativan   DVT prophylaxis:  Lovenox Code Status: Full code Family Communication: No family present at bedside Disposition Plan: Anticipate discharge home once respiratory status stable Consults called: None Admission status: Observation Level of care: Med-Surg   At the point of initial evaluation, it is my clinical opinion that admission for OBSERVATION is reasonable and necessary because the patient's presenting complaints in the context of their chronic conditions represent sufficient risk of deterioration or significant morbidity to constitute reasonable grounds for close observation in the hospital setting, but that the patient may be medically stable for discharge from the hospital within 24 to 48 hours.    Jasmond River J [place] DO [facility] Available via Epic secure chat 7am-7pm After these hours, please refer to coverage  provider listed on [email or web address] 08/12/2021, 2:25 PM

## 2021-08-12 NOTE — ED Triage Notes (Signed)
Pt arrived POV from home c/o [facility] since Monday. Pt states he has COPD. Pt also states this did not come from smoking cigarettes because I quit.

## 2021-08-13 ENCOUNTER — Encounter: Payer: Self-pay | Admitting: Gastroenterology

## 2021-08-13 ENCOUNTER — Other Ambulatory Visit: Payer: Self-pay

## 2021-08-13 ENCOUNTER — Other Ambulatory Visit (HOSPITAL_COMMUNITY): Payer: Self-pay

## 2021-08-13 DIAGNOSIS — J441 Chronic obstructive pulmonary disease with (acute) exacerbation: Secondary | ICD-10-CM | POA: Diagnosis not present

## 2021-08-13 LAB — BASIC METABOLIC PANEL
Anion gap: 11 (ref 5–15)
BUN: 13 mg/dL (ref 8–23)
CO2: 18 mmol/L — ABNORMAL LOW (ref 22–32)
Calcium: 9.6 mg/dL (ref 8.9–10.3)
Chloride: 107 mmol/L (ref 98–111)
Creatinine, Ser: 1.04 mg/dL (ref 0.61–1.24)
GFR, Estimated: >60 mL/min (ref >60)
Glucose, Bld: 154 mg/dL — ABNORMAL HIGH (ref 70–99)
Potassium: 4.1 mmol/L (ref 3.5–5.1)
Sodium: 136 mmol/L (ref 135–145)

## 2021-08-13 LAB — MAGNESIUM: Magnesium: 2.4 mg/dL (ref 1.7–2.4)

## 2021-08-13 MED ORDER — SPIRIVA HANDIHALER 18 MCG IN CAPS
18.0000 ug | ORAL_CAPSULE | Freq: Every day | RESPIRATORY_TRACT | 2 refills | Status: DC
  Filled 2021-08-13: qty 30, 30d supply, fill #0

## 2021-08-13 MED ORDER — ATORVASTATIN CALCIUM 10 MG PO TABS
10.0000 mg | ORAL_TABLET | Freq: Every day | ORAL | 2 refills | Status: DC
  Filled 2021-08-13 – 2021-10-07 (×2): qty 30, 30d supply, fill #0
  Filled 2021-11-08: qty 30, 30d supply, fill #1
  Filled 2021-12-13: qty 30, 30d supply, fill #2

## 2021-08-13 MED ORDER — AZITHROMYCIN 500 MG PO TABS
500.0000 mg | ORAL_TABLET | Freq: Every day | ORAL | 0 refills | Status: AC
  Filled 2021-08-13: qty 4, 4d supply, fill #0

## 2021-08-13 MED ORDER — ALBUTEROL SULFATE HFA 108 (90 BASE) MCG/ACT IN AERS
2.0000 | INHALATION_SPRAY | RESPIRATORY_TRACT | 5 refills | Status: DC | PRN
  Filled 2021-08-13: qty 8.5, 16d supply, fill #0

## 2021-08-13 MED ORDER — FLUTICASONE-SALMETEROL 500-50 MCG/ACT IN AEPB
1.0000 | INHALATION_SPRAY | Freq: Two times a day (BID) | RESPIRATORY_TRACT | 2 refills | Status: DC
  Filled 2021-08-13: qty 60, 30d supply, fill #0

## 2021-08-13 MED ORDER — AMLODIPINE BESYLATE 5 MG PO TABS
5.0000 mg | ORAL_TABLET | Freq: Every day | ORAL | 2 refills | Status: DC
  Filled 2021-08-13 – 2021-10-07 (×2): qty 30, 30d supply, fill #0
  Filled 2021-11-08: qty 30, 30d supply, fill #1
  Filled 2021-12-13: qty 30, 30d supply, fill #2

## 2021-08-13 MED ORDER — PREDNISONE 10 MG PO TABS
40.0000 mg | ORAL_TABLET | Freq: Every day | ORAL | 0 refills | Status: DC
  Filled 2021-08-13: qty 20, 5d supply, fill #0

## 2021-08-13 MED ORDER — IPRATROPIUM-ALBUTEROL 0.5-2.5 (3) MG/3ML IN SOLN
3.0000 mL | Freq: Four times a day (QID) | RESPIRATORY_TRACT | 2 refills | Status: DC | PRN
  Filled 2021-08-13: qty 180, 15d supply, fill #0

## 2021-08-13 MED ORDER — PANTOPRAZOLE SODIUM 40 MG PO TBEC
40.0000 mg | DELAYED_RELEASE_TABLET | Freq: Every day | ORAL | 2 refills | Status: DC
  Filled 2021-08-13 – 2021-09-13 (×2): qty 30, 30d supply, fill #0
  Filled 2021-10-07: qty 30, 30d supply, fill #1

## 2021-08-13 NOTE — Progress Notes (Signed)
Patient discharging home. Vital signs stable at time of discharge as reflected in discharge summary. Discharge instructions given and verbal understanding returned. Patient discharging with TOC medications. No questions at this time.

## 2021-08-13 NOTE — Evaluation (Signed)
Physical Therapy Evaluation Patient Details Name: Jason Wong MRN: [id number] DOB: 06-29-1955 Today's Date: 08/13/2021  History of Present Illness  Pt is a 66 y.o. male who presented to the ED 5/25 with c/o SOB. Admitted for COPD exacerbation. PMH: COPD, HTN, HLD, GERD, EtOH abuse, history of prior tobacco abuse   Clinical Impression  PT eval complete. Pt I/mod I all functional mobility, including ambulation 350' without AD. SpO2 96% on RA. Max HR 100. Pt reported feeling SOB but able to hold conversation for entirety of session and use inspirometer upon return to room. No further skilled PT intervention indicated. PT signing off.        Recommendations for follow up therapy are one component of a multi-disciplinary discharge planning process, led by the attending physician.  Recommendations may be updated based on patient status, additional functional criteria and insurance authorization.  Follow Up Recommendations No PT follow up    Assistance Recommended at Discharge PRN  Patient can return home with the following       Equipment Recommendations None recommended by PT  Recommendations for Other Services       Functional Status Assessment Patient has not had a recent decline in their functional status     Precautions / Restrictions Precautions Precautions: None      Mobility  Bed Mobility Overal bed mobility: Independent                  Transfers Overall transfer level: Independent                      Ambulation/Gait Ambulation/Gait assistance: Modified independent (Device/Increase time) Gait Distance (Feet): 350 Feet Assistive device: None Gait Pattern/deviations: Step-through pattern, Decreased stride length Gait velocity: decreased Gait velocity interpretation: 1.31 - 2.62 ft/sec, indicative of limited community ambulator   General Gait Details: SpO2 96% on RA. Max HR 100  Stairs            Wheelchair Mobility    Modified Rankin  (Stroke Patients Only)       Balance Overall balance assessment: No apparent balance deficits (not formally assessed)                                           Pertinent Vitals/Pain Pain Assessment Pain Assessment: No/denies pain    Home Living Family/patient expects to be discharged to:: Private residence Living Arrangements: Other relatives (nephew) Available Help at Discharge: Family;Available PRN/intermittently Type of Home: House Home Access: Stairs to enter Entrance Stairs-Rails: Right Entrance Stairs-Number of Steps: 3 Alternate Level Stairs-Number of Steps: flight Home Layout: Two level Home Equipment: None      Prior Function Prior Level of Function : Independent/Modified Independent             Mobility Comments: walks to the store       Hand Dominance   Dominant Hand: Right    Extremity/Trunk Assessment   Upper Extremity Assessment Upper Extremity Assessment: LUE deficits/detail LUE Deficits / Details: missing fingers L hand. Pt reports from an accident when he was 47.    Lower Extremity Assessment Lower Extremity Assessment: Overall WFL for tasks assessed    Cervical / Trunk Assessment Cervical / Trunk Assessment: Normal  Communication   Communication: No difficulties  Cognition Arousal/Alertness: Awake/alert Behavior During Therapy: WFL for tasks assessed/performed Overall Cognitive Status: Within Functional Limits for tasks assessed  General Comments General comments (skin integrity, edema, etc.): inspirometer encouraged    Exercises     Assessment/Plan    PT Assessment Patient does not need any further PT services  PT Problem List         PT Treatment Interventions      PT Goals (Current goals can be found in the Care Plan section)  Acute Rehab PT Goals Patient Stated Goal: breathe better PT Goal Formulation: All assessment and education complete, DC  therapy    Frequency       Co-evaluation               AM-PAC PT "6 Clicks" Mobility  Outcome Measure Help needed turning from your back to your side while in a flat bed without using bedrails?: None Help needed moving from lying on your back to sitting on the side of a flat bed without using bedrails?: None Help needed moving to and from a bed to a chair (including a wheelchair)?: None Help needed standing up from a chair using your arms (e.g., wheelchair or bedside chair)?: None Help needed to walk in hospital room?: None Help needed climbing 3-5 steps with a railing? : None 6 Click Score: 24    End of Session   Activity Tolerance: Patient tolerated treatment well Patient left: in bed Nurse Communication: Mobility status PT Visit Diagnosis: Difficulty in walking, not elsewhere classified (R26.2)    Time: 8341-9622 PT Time Calculation (min) (ACUTE ONLY): 14 min   Charges:   PT Evaluation $PT Eval Low Complexity: 1 Low          Lorrin Goodell, PT  Office # [phone] Pager [phone]   Lorriane Shire 08/13/2021, 8:29 AM

## 2021-08-13 NOTE — Discharge Summary (Signed)
Physician Discharge Summary  Jason Wong UYQ:[id number] DOB: October 16, 1955 DOA: 08/12/2021  PCP: Ladell Pier, MD  Admit date: 08/12/2021 Discharge date: 08/13/2021  Admitted From: Home Disposition: Home  Recommendations for Outpatient Follow-up:  Follow up with PCP in 1-2 weeks We will refer to outpatient pulmonology for COPD in the setting of multiple ED presentations/hospitalizations, although likely related to medication noncompliance Continue prednisone for 5 days and azithromycin for additional 4 days for acute COPD exacerbation Refilled home inhalers to include Advair, Spiriva, albuterol MDI, and DuoNeb solution Please continue to enforce medication compliance  Home Health: None identified by PT Equipment/Devices: None identified by PT  Discharge Condition: Stable CODE STATUS: Full code Diet recommendation: Heart healthy diet  History of present illness:  Jason Wong is a 66 y.o. male with past medical history significant for COPD, HTN, HLD, GERD, EtOH abuse, history of prior tobacco abuse who presented to [facility] ED 5/25 via POV with progressive shortness of breath.  Patient reports onset Monday.  Also associated with nonproductive cough.  He reports out of his home inhalers for 1 week but does utilize a home nebulizer as needed without any significant improvement.  He also reports recently received a vaccination last week, believes it is for shingles.  Patient also reported some chest discomfort which has self resolved.   In the ED, temperature 97.5 F, HR 86, RR 26, BP 04/16/1993, SPO2 96% on room air.  WBC 6.3, hemoglobin 13.7, platelet count.  Sodium 145, potassium 3.9, chloride 111, CO2 26, glucose 110, BUN 9, creatinine 1.03.  Has Jason Wong and troponin 4, 4.  Chest x-ray with no acute cardiopulmonary disease process.  CT angiogram chest negative for PE, no pulmonary parenchymal findings, positive central lobar emphysema upper lobes.  EKG with normal sinus rhythm, RAE, no  dynamic changes.  Patient received DuoNebs x3, IV Solu-Medrol, IV magnesium by EDP.  Patient continued with significant shortness of breath upon treatment in the ED and EDP requested admission for COPD exacerbation.  Hospitalist consulted for admission for COPD exacerbation likely secondary to noncompliance with home inhaler regimen as its been out for 1 week.  Hospital course:  Acute COPD exacerbation Patient presenting with progressive shortness of breath since Monday.  Has been out of his home inhalers for roughly 1 week.  Home regimen includes Spiriva, Breo Ellipta, albuterol MDI as needed and DuoNebs as needed.  Reports nonproductive cough.  Patient is afebrile without leukocytosis.  CT angiogram chest negative for PE, no pulmonary parenchymal disease/findings, noted central lobar emphysema upper lobes.  Etiology likely secondary to noncompliance with outpatient regimen. Not hypoxic at rest or during ambulation but patient with slightly increased work of breathing.  Patient was admitted and started on scheduled Brovana, Pulmicort, DuoNebs with IV Solu-Medrol and azithromycin.  Patient responded well to treatment and feels close to his normal baseline.  Discussed with patient extensively that he needs to comply with his medications and ensure that he has adequate refills and to follow-up with PCP.  We will also refer to outpatient pulmonology for continued follow-up given his multiple hospitalizations for such.  Discharged on Advair, Spiriva, prednisone, azithromycin, albuterol MDI, and DuoNebs as needed.  Outpatient follow-up with PCP.   Essential hypertension Continue home amlodipine 5 mg p.o. daily.   Hyperlipidemia Continue atorvastatin 10 mg p.o. daily   GERD Protonix 40 mg p.o. daily   EtOH use disorder Patient reports fairly frequent alcohol use with beer and occasional shots.  Does not quantify.  Denies any history of  withdrawal symptoms.  Discussed need for complete  cessation.  Discharge Diagnoses:  Principal Problem:   COPD exacerbation [facility])    Discharge Instructions  Discharge Instructions     Call MD for:  difficulty breathing, headache or visual disturbances   Complete by: As directed    Call MD for:  extreme fatigue   Complete by: As directed    Call MD for:  persistant dizziness or light-headedness   Complete by: As directed    Call MD for:  persistant nausea and vomiting   Complete by: As directed    Call MD for:  severe uncontrolled pain   Complete by: As directed    Call MD for:  temperature >100.4   Complete by: As directed    Diet - low sodium heart healthy   Complete by: As directed    Increase activity slowly   Complete by: As directed       Allergies as of 08/13/2021       Reactions   Tobacco Shortness Of Breath, Other (See Comments)   CIGARETTE SMOKE TRIGGERS THE PATIENT'S RESPIRATORY ISSUES!!        Medication List     STOP taking these medications    Breo Ellipta 100-25 MCG/ACT Aepb Generic drug: fluticasone furoate-vilanterol Replaced by: fluticasone-salmeterol 500-50 MCG/ACT Aepb   multivitamin with minerals Tabs tablet       TAKE these medications    albuterol 108 (90 Base) MCG/ACT inhaler Commonly known as: VENTOLIN HFA Inhale 2 puffs into the lungs every 4 (four) hours as needed for wheezing or shortness of breath.   amLODipine 5 MG tablet Commonly known as: NORVASC Take 1 tablet (5 mg total) by mouth daily.   atorvastatin 10 MG tablet Commonly known as: LIPITOR Take 1 tablet (10 mg total) by mouth daily.   azithromycin 500 MG tablet Commonly known as: ZITHROMAX Take 1 tablet (500 mg total) by mouth daily for 4 days.   fluticasone-salmeterol 500-50 MCG/ACT Aepb Commonly known as: ADVAIR Inhale 1 puff into the lungs in the morning and at bedtime. Replaces: Breo Ellipta 100-25 MCG/ACT Aepb   ipratropium-albuterol 0.5-2.5 (3) MG/3ML Soln Commonly known as: DUONEB Take 3 mLs by  nebulization every 6 (six) hours as needed (Shortness of breath or wheezing). What changed:  when to take this reasons to take this   pantoprazole 40 MG tablet Commonly known as: PROTONIX Take 1 tablet (40 mg total) by mouth daily at 6 (six) AM.   predniSONE 10 MG tablet Commonly known as: DELTASONE Take 4 tablets (40 mg total) by mouth daily for 5 days.   Spiriva HandiHaler 18 MCG inhalation capsule Generic drug: tiotropium Place 1 capsule (18 mcg total) into inhaler and inhale daily.        Follow-up Information     Ladell Pier, MD. Schedule an appointment as soon as possible for a visit in 1 week(s).   Specialty: Internal Medicine Contact information: [address] [place] [address] [place] [address] [phone]                Allergies  Allergen Reactions   Tobacco Shortness Of Breath and Other (See Comments)    CIGARETTE SMOKE TRIGGERS THE PATIENT'S RESPIRATORY ISSUES!!    Consultations: None   Procedures/Studies: DG Chest 2 View  Result Date: 08/12/2021 CLINICAL DATA:  shortness of breath EXAM: CHEST - 2 VIEW COMPARISON:  Chest radiograph June 14, 2021 FINDINGS: No consolidation. No visible pleural effusions or pneumothorax. Cardiomediastinal silhouette is within  normal limits. No displaced fracture. IMPRESSION: No evidence of acute cardiopulmonary disease. Electronically Signed   By: Margaretha Sheffield M.D.   On: 08/12/2021 10:23   CT Angio Chest PE W and/or Wo Contrast  Result Date: 08/12/2021 CLINICAL DATA:  Concern for pulmonary embolism. EXAM: CT ANGIOGRAPHY CHEST WITH CONTRAST TECHNIQUE: Multidetector CT imaging of the chest was performed using the standard protocol during bolus administration of intravenous contrast. Multiplanar CT image reconstructions and MIPs were obtained to evaluate the vascular anatomy. RADIATION DOSE REDUCTION: This exam was performed according to the departmental dose-optimization program which includes automated  exposure control, adjustment of the mA and/or kV according to patient size and/or use of iterative reconstruction technique. CONTRAST:  75m OMNIPAQUE IOHEXOL 350 MG/ML SOLN COMPARISON:  None Available. FINDINGS: Cardiovascular: No filling defects within the pulmonary arteries to suggest acute pulmonary embolism. Mediastinum/Nodes: No axillary or supraclavicular adenopathy. No mediastinal or hilar adenopathy. No pericardial fluid. Esophagus normal. Lungs/Pleura: No suspicious pulmonary nodules. Normal pleural. Airways normal. Centrilobular emphysema the upper lobes. Upper Abdomen: Limited view of the liver, kidneys, pancreas are unremarkable. Normal adrenal glands. Musculoskeletal: No aggressive osseous lesion. Review of the MIP images confirms the above findings. IMPRESSION: 1. No evidence acute pulmonary embolism. 2. No acute pulmonary parenchymal findings. 3. Centrilobular emphysema the upper lobes  (ICD10-J43.9). Electronically Signed   By: SSuzy BouchardM.D.   On: 08/12/2021 13:18     Subjective: Patient seen and examined at bedside, resting comfortably.  Sitting at edge of bed.  Breathing much improved.  Ambulatory O2 screen by physical therapy this morning with no desaturation.  Discussed with patient needs to comply with his home medication regimen as prescribed and needs to ensure that he has adequate refills.  No other specific complaints or concerns at this time.  Denies headache, no fever/chills/night sweats, no nausea/vomiting/diarrhea, no chest pain, no palpitations, no shortness of breath than his typical baseline, no abdominal pain, no focal weakness, no fatigue, no paresthesias.  No acute events overnight per nursing staff.  Discharge Exam: Vitals:   08/13/21 0500 08/13/21 0818  BP: 126/78   Pulse: 80   Resp: 18   Temp: 97.8 F (36.6 C)   SpO2: 100% 100%   Vitals:   08/12/21 2048 08/13/21 0028 08/13/21 0500 08/13/21 0818  BP:  (!) 123/102 126/78   Pulse: 91 78 80   Resp: '20 19  18   '$ Temp:  98.4 F (36.9 C) 97.8 F (36.6 C)   TempSrc:  Oral Oral   SpO2: 98% 100% 100% 100%  Weight:      Height:        Physical Exam: GEN: NAD, alert and oriented x 3, chronically ill in appearance, appears older than stated age HEENT: NCAT, PERRL, EOMI, sclera clear, MMM PULM: Late expiratory wheezing throughout all lung fields, no crackles, no increased respiratory effort or accessory muscle use, on room air at rest and with ambulation today CV: RRR w/o M/G/R GI: abd soft, NTND, NABS, no R/G/M MSK: Noted left hand second digit amputation, no peripheral edema, muscle strength globally intact 5/5 bilateral upper/lower extremities NEURO: CN II-XII intact, no focal deficits, sensation to light touch intact PSYCH: normal mood/affect Integumentary: Chronic skin changes bilateral lower extremities, no concerning rashes/lesions/wounds    The results of significant diagnostics from this hospitalization (including imaging, microbiology, ancillary and laboratory) are listed below for reference.     Microbiology: No results found for this or any previous visit (from the past 240 hour(s)).  Labs: BNP (last 3 results) Recent Labs    05/03/21 0101 05/07/21 0950 06/14/21 0701  BNP 82.9 77.8 62.9   Basic Metabolic Panel: Recent Labs  Lab 08/12/21 0951 08/13/21 0400  NA 145 136  K 3.9 4.1  CL 111 107  CO2 26 18*  GLUCOSE 110* 154*  BUN 9 13  CREATININE 1.03 1.04  CALCIUM 9.8 9.6  MG  --  2.4   Liver Function Tests: Recent Labs  Lab 08/12/21 0951  AST 28  ALT 19  ALKPHOS 65  BILITOT 1.2  PROT 7.6  ALBUMIN 4.4   No results for input(s): LIPASE, AMYLASE in the last 168 hours. No results for input(s): AMMONIA in the last 168 hours. CBC: Recent Labs  Lab 08/12/21 0951  WBC 6.3  NEUTROABS 2.1  HGB 13.7  HCT 41.6  MCV 100.5*  PLT 291   Cardiac Enzymes: No results for input(s): CKTOTAL, CKMB, CKMBINDEX, TROPONINI in the last 168 hours. BNP: Invalid  input(s): POCBNP CBG: No results for input(s): GLUCAP in the last 168 hours. D-Dimer No results for input(s): DDIMER in the last 72 hours. Hgb A1c No results for input(s): HGBA1C in the last 72 hours. Lipid Profile No results for input(s): CHOL, HDL, LDLCALC, TRIG, CHOLHDL, LDLDIRECT in the last 72 hours. Thyroid function studies No results for input(s): TSH, T4TOTAL, T3FREE, THYROIDAB in the last 72 hours.  Invalid input(s): FREET3 Anemia work up No results for input(s): VITAMINB12, FOLATE, FERRITIN, TIBC, IRON, RETICCTPCT in the last 72 hours. Urinalysis    Component Value Date/Time   COLORURINE YELLOW 06/14/2021 0715   APPEARANCEUR CLEAR 06/14/2021 0715   LABSPEC 1.012 06/14/2021 0715   PHURINE 5.0 06/14/2021 0715   GLUCOSEU NEGATIVE 06/14/2021 0715   HGBUR NEGATIVE 06/14/2021 0715   BILIRUBINUR NEGATIVE 06/14/2021 0715   KETONESUR NEGATIVE 06/14/2021 0715   PROTEINUR NEGATIVE 06/14/2021 0715   NITRITE NEGATIVE 06/14/2021 0715   LEUKOCYTESUR NEGATIVE 06/14/2021 0715   Sepsis Labs Invalid input(s): PROCALCITONIN,  WBC,  LACTICIDVEN Microbiology No results found for this or any previous visit (from the past 240 hour(s)).   Time coordinating discharge: Over 30 minutes  SIGNED:   Donnamarie Poag [place] DO  [facility] 08/13/2021, 9:19 AM

## 2021-08-13 NOTE — Progress Notes (Signed)
SATURATION QUALIFICATIONS: (This note is used to comply with regulatory documentation for home oxygen)  Patient Saturations on Room Air at Rest = 100%  Patient Saturations on Room Air while Ambulating = 97%

## 2021-08-17 ENCOUNTER — Telehealth: Payer: Self-pay

## 2021-08-17 NOTE — Telephone Encounter (Signed)
Transition Care Management Unsuccessful Follow-up Telephone Call  Date of discharge and from where:  08/13/2021, [facility]  Attempts:  1st Attempt  Reason for unsuccessful TCM follow-up call:  Unable to reach patient  call placed # [phone] and it rings fast busy. I also called #  [phone] and the recording stated that the call could not be completed at this time   Need to schedule a hospital follow up appointment with PCP

## 2021-08-18 ENCOUNTER — Telehealth: Payer: Self-pay

## 2021-08-18 NOTE — Telephone Encounter (Signed)
Noted  

## 2021-08-18 NOTE — Telephone Encounter (Signed)
Transition Care Management Follow-up Telephone Call His phone number has been updated in [place] Date of discharge and from where: 08/13/2021, [facility] How have you been since you were released from the hospital? He said he is feeling " so so " but better than when he was admitted to the hospital Any questions or concerns? Yes- he was concerned about a referral to pulmonary.  I informed him that the referral was placed when he was discharged and pulmonary should be contacting him. He said he is not able to sleep more than 2 hours as night and then has a difficult time falling back to sleep. He wants to speak to Dr Wynetta Emery about this at his upcoming appt. He said the difficulty sleeping is not due to his breathing.  He has multiple questions about his medications. He has not picked up any of the newly prescribed medications. He said he only has his BP pills and the nebulizer solution. He has run out of the inhalers. He plans to bring all of his medication bottles and empty inhalers to his appointment with Benard Halsted, RPH next week. He stated that Lurena Joiner will help him straighten this out.   Items Reviewed: Did the pt receive and understand the discharge instructions provided? Yes  Medications obtained and verified?  Medication information noted above.  He has a nebulizer.  Other? No  Any new allergies since your discharge? No  Dietary orders reviewed? Yes Do you have support at home? Yes , his nephew.   Home Care and Equipment/Supplies: Were home health services ordered? no If so, what is the name of the agency? N/a  Has the agency set up a time to come to the patient's home? not applicable Were any new equipment or medical supplies ordered?  No What is the name of the medical supply agency? N/a Were you able to get the supplies/equipment? not applicable Do you have any questions related to the use of the equipment or supplies? No  Functional Questionnaire: (I = Independent and D =  Dependent) ADLs: independent  Follow up appointments reviewed:  PCP Hospital f/u appt confirmed? Yes  Scheduled to see Dr Wynetta Emery - 08/31/2021.  [facility] f/u appt confirmed? Yes  Scheduled to see GI- 09/15/2021.  Are transportation arrangements needed? No  If their condition worsens, is the pt aware to call PCP or go to the Emergency Dept.? Yes Was the patient provided with contact information for the PCP's office or ED? Yes Was to pt encouraged to call back with questions or concerns? Yes

## 2021-08-24 ENCOUNTER — Encounter: Payer: Self-pay | Admitting: Pharmacist

## 2021-08-24 ENCOUNTER — Telehealth: Payer: Self-pay | Admitting: Pharmacist

## 2021-08-24 ENCOUNTER — Ambulatory Visit: Payer: Medicare Other | Attending: Internal Medicine | Admitting: Pharmacist

## 2021-08-24 VITALS — BP 137/82 | HR 78 | Temp 98.6°F | Ht 69.0 in | Wt 131.8 lb

## 2021-08-24 DIAGNOSIS — Z0000 Encounter for general adult medical examination without abnormal findings: Secondary | ICD-10-CM

## 2021-08-24 DIAGNOSIS — H538 Other visual disturbances: Secondary | ICD-10-CM

## 2021-08-24 NOTE — Telephone Encounter (Signed)
Patient is requesting Ophthalmology referral. I completed his Medicare AWV today.

## 2021-08-24 NOTE — Addendum Note (Signed)
Addended by: Karle Plumber B on: 08/24/2021 09:23 PM   Modules accepted: Orders

## 2021-08-24 NOTE — Progress Notes (Signed)
Subjective:   Jason Wong is a 66 y.o. male who presents for Medicare Annual/Subsequent preventive examination.  Review of Systems    Cardiac Risk Factors include: advanced age (>75mn, >>45women);dyslipidemia;hypertension;male gender;sedentary lifestyle;smoking/ tobacco exposure     Objective:    Today's Vitals   08/24/21 0845 08/24/21 0849  BP: 137/82   Pulse: 78   Temp: 98.6 F (37 C)   SpO2: 100%   Weight: 131 lb 12.8 oz (59.8 kg)   Height: '5\' 9"'$  (1.753 m)   PainSc: 0-No pain 0-No pain   Body mass index is 19.46 kg/m.     08/24/2021    8:56 AM 08/12/2021    4:00 PM 06/14/2021    2:28 AM 05/01/2021    5:00 PM 04/30/2021    6:59 PM 02/19/2021    1:00 PM 02/18/2021    9:18 PM  Advanced Directives  Does Patient Have a Medical Advance Directive? No No No No No No No  Would patient like information on creating a medical advance directive?  No - Patient declined No - Patient declined No - Patient declined  No - Patient declined     Current Medications (verified) Outpatient Encounter Medications as of 08/24/2021  Medication Sig   albuterol (VENTOLIN HFA) 108 (90 Base) MCG/ACT inhaler Inhale 2 puffs into the lungs every 4 (four) hours as needed for wheezing or shortness of breath.   amLODipine (NORVASC) 5 MG tablet Take 1 tablet (5 mg total) by mouth daily.   atorvastatin (LIPITOR) 10 MG tablet Take 1 tablet (10 mg total) by mouth daily.   fluticasone-salmeterol (ADVAIR) 500-50 MCG/ACT AEPB Inhale 1 puff into the lungs in the morning and at bedtime.   ipratropium-albuterol (DUONEB) 0.5-2.5 (3) MG/3ML SOLN Take 3 mLs by nebulization every 6 (six) hours as needed (Shortness of breath or wheezing).   pantoprazole (PROTONIX) 40 MG tablet Take 1 tablet (40 mg total) by mouth daily at 6 (six) AM.   tiotropium (SPIRIVA HANDIHALER) 18 MCG inhalation capsule Place 1 capsule (18 mcg total) into inhaler and inhale daily.   No facility-administered encounter medications on file as of  08/24/2021.    Allergies (verified) Tobacco   History: Past Medical History:  Diagnosis Date   Asthma    COPD (chronic obstructive pulmonary disease) (HCC)    Hepatitis C antibody positive in blood    HLD (hyperlipidemia)    HTN (hypertension)    Past Surgical History:  Procedure Laterality Date   INCISE AND DRAIN ABCESS     dog bite   Family History  Problem Relation Age of Onset   Hypertension Mother    Breast cancer Sister    Hypertension Sister    Colon cancer Brother    Colon polyps Neg Hx    Esophageal cancer Neg Hx    Rectal cancer Neg Hx    Stomach cancer Neg Hx    Social History   Socioeconomic History   Marital status: Single    Spouse name: Not on file   Number of children: Not on file   Years of education: Not on file   Highest education level: Not on file  Occupational History   Not on file  Tobacco Use   Smoking status: Former    Packs/day: 0.50    Years: 45.00    Pack years: 22.50    Types: Cigarettes    Quit date: 07/17/2021    Years since quitting: 0.1   Smokeless tobacco: Never  Substance and Sexual Activity  Alcohol use: Yes    Alcohol/week: 1.0 standard drink    Types: 1 Cans of beer per week    Comment: Tries to stay alcohol free   Drug use: Not Currently    Types: "Crack" cocaine   Sexual activity: Yes    Partners: Female  Other Topics Concern   Not on file  Social History Narrative   Not on file   Social Determinants of Health   Financial Resource Strain: Not on file  Food Insecurity: Not on file  Transportation Needs: Not on file  Physical Activity: Not on file  Stress: Not on file  Social Connections: Not on file    Tobacco Counseling Counseling given: Yes   Clinical Intake:  Pre-visit preparation completed: No  Pain : No/denies pain Pain Score: 0-No pain  BMI - recorded: 19.46 Nutritional Status: BMI of 19-24  Normal Nutritional Risks: None (Does note a decreased appetite) Diabetes: No CBG done?: No Did  pt. bring in CBG monitor from home?: No  How often do you need to have someone help you when you read instructions, pamphlets, or other written materials from your doctor or pharmacy?: 3 - Sometimes What is the last grade level you completed in school?: 12th  Diabetic? No  Interpreter Needed?: No  Information entered by :: Jason Wong   Activities of Daily Living    08/24/2021    8:59 AM 08/12/2021    4:00 PM  In your present state of health, do you have any difficulty performing the following activities:  Hearing? 0 0  Vision? 1 0  Comment No eye doctor   Difficulty concentrating or making decisions? 0 0  Walking or climbing stairs? 0 0  Dressing or bathing? 0 0  Doing errands, shopping? 0 0  Preparing Food and eating ? N   Using the Toilet? N   In the past six months, have you accidently leaked urine? N   Do you have problems with loss of bowel control? N   Managing your Medications? N   Managing your Finances? N   Housekeeping or managing your Housekeeping? N     Patient Care Team: Ladell Pier, MD as PCP - General (Internal Medicine)  Indicate any recent Medical Services you may have received from other than [facility] providers in the past year (date may be approximate).     Assessment:   This is a routine wellness examination for Jason Wong.  Hearing/Vision screen No results found.  Dietary issues and exercise activities discussed: Current Exercise Habits: The patient does not participate in regular exercise at present (Nothing since hospitalization.), Exercise limited by: respiratory conditions(s)   Goals Addressed   None   Depression Screen    08/24/2021    8:58 AM 07/22/2021    8:48 AM 03/23/2021    8:52 AM 04/28/2020    9:09 AM 09/06/2019    8:55 AM 04/22/2019    9:42 AM 01/03/2019   11:53 AM  PHQ 2/9 Scores  PHQ - 2 Score 0 0 0 0 0 0 6  PHQ- 9 Score '6 4     6    '$ Fall Risk    08/24/2021    8:58 AM 03/23/2021    8:52 AM 08/27/2020    8:53 AM 04/28/2020    9:07 AM  01/06/2020    8:32 AM  Fall Risk   Falls in the past year? 0 0 0 1 0  Number falls in past yr: 0 0 0 0 0  Injury with  Fall? 0 0 0 0 0  Risk for fall due to :  No Fall Risks No Fall Risks    Follow up Falls evaluation completed;Education provided;Falls prevention discussed   Education provided     FALL RISK PREVENTION PERTAINING TO THE HOME:  Any stairs in or around the home? No  If so, are there any without handrails? No  Home free of loose throw rugs in walkways, pet beds, electrical cords, etc? Yes  Adequate lighting in your home to reduce risk of falls? Yes   ASSISTIVE DEVICES UTILIZED TO PREVENT FALLS:  Life alert? No  Use of a cane, walker or w/c? No  Grab bars in the bathroom? No  Shower chair or bench in shower? No  Elevated toilet seat or a handicapped toilet? No   TIMED UP AND GO:  Was the test performed? Yes .  Length of time to ambulate 10 feet: <5 sec.   Gait steady and fast without use of assistive device  Cognitive Function:    08/24/2021    9:01 AM 04/28/2020    9:10 AM  MMSE - Mini Mental State Exam  Orientation to time 4 4  Orientation to Place 5 5  Registration 3 3  Attention/ Calculation 4 0  Recall 2 1  Language- name 2 objects 2 2  Language- repeat 1 1  Language- follow 3 step command 3 3  Language- read & follow direction 1 1  Write a sentence 1 1  Copy design 0 0  Total score 26 21        Immunizations Immunization History  Administered Date(s) Administered   Fluad Quad(high Dose 65+) 02/22/2021   Hepatitis A, Adult 08/28/2017   Hepatitis B, adult 08/28/2017, 10/03/2017   Influenza,inj,Quad PF,6+ Mos 01/24/2017, 12/25/2017, 01/03/2019, 01/07/2020   Pneumococcal Conjugate-13 04/28/2020   Pneumococcal Polysaccharide-23 07/22/2021   Tdap 07/17/2015   Zoster Recombinat (Shingrix) 09/09/2020    TDAP status: Up to date  Flu Vaccine status: Up to date  Pneumococcal vaccine status: Up to date  Covid-19 vaccine status: Declined,  Education has been provided regarding the importance of this vaccine but patient still declined. Advised may receive this vaccine at local pharmacy or Health Dept.or vaccine clinic. Aware to provide a copy of the vaccination record if obtained from local pharmacy or Health Dept. Verbalized acceptance and understanding.  Qualifies for Shingles Vaccine? Yes   Zostavax completed No   Shingrix Completed?: No.    Education has been provided regarding the importance of this vaccine. Patient has been advised to call insurance company to determine out of pocket expense if they have not yet received this vaccine. Advised may also receive vaccine at local pharmacy or Health Dept. Verbalized acceptance and understanding.  Screening Tests Health Maintenance  Topic Date Due   COVID-19 Vaccine (1) Never done   COLONOSCOPY (Pts 45-47yr Insurance coverage will need to be confirmed)  10/26/2020   Zoster Vaccines- Shingrix (2 of 2) 10/22/2021 (Originally 11/04/2020)   INFLUENZA VACCINE  10/19/2021   TETANUS/TDAP  07/16/2025   Pneumonia Vaccine 66 Years old  Completed   Hepatitis C Screening  Completed   HPV VACCINES  Aged Out    Health Maintenance  Health Maintenance Due  Topic Date Due   COVID-19 Vaccine (1) Never done   COLONOSCOPY (Pts 45-436yrInsurance coverage will need to be confirmed)  10/26/2020    Colorectal cancer screening: Type of screening: Colonoscopy. Completed 2019. Repeat every 3 years - Has appt coming up later  this month.   Lung Cancer Screening: (Low Dose CT Chest recommended if Age 19-80 years, 30 pack-year currently smoking OR have quit w/in 15years.) does qualify.   Lung Cancer Screening Referral: Already completed. CT from 2021 with Lung-RADS 1,negative. Recommended yearly screening. CT Angio done 2023 with no new findings.   Additional Screening:  Hepatitis C Screening: does qualify; Completed 04/22/19.  Vision Screening: Recommended annual ophthalmology exams for early  detection of glaucoma and other disorders of the eye. Is the patient up to date with their annual eye exam?  No  Who is the provider or what is the name of the office in which the patient attends annual eye exams? None If pt is not established with a provider, would they like to be referred to a provider to establish care? Yes .   Dental Screening: Recommended annual dental exams for proper oral hygiene  Community Resource Referral / Chronic Care Management: CRR required this visit?  No   CCM required this visit?  No      Plan:     I have personally reviewed and noted the following in the patient's chart:   Medical and social history Use of alcohol, tobacco or illicit drugs  Current medications and supplements including opioid prescriptions. Patient is not currently taking opioid prescriptions. Functional ability and status Nutritional status Physical activity Advanced directives List of other physicians Hospitalizations, surgeries, and ER visits in previous 12 months Vitals Screenings to include cognitive, depression, and falls Referrals and appointments  In addition, I have reviewed and discussed with patient certain preventive protocols, quality metrics, and best practice recommendations. A written personalized care plan for preventive services as well as general preventive health recommendations were provided to patient.     Tresa Endo, RPH-CPP   08/24/2021

## 2021-08-31 ENCOUNTER — Other Ambulatory Visit: Payer: Self-pay

## 2021-08-31 ENCOUNTER — Other Ambulatory Visit: Payer: Self-pay | Admitting: Pharmacist

## 2021-08-31 ENCOUNTER — Encounter: Payer: Self-pay | Admitting: Internal Medicine

## 2021-08-31 ENCOUNTER — Ambulatory Visit: Payer: Medicare Other | Attending: Internal Medicine | Admitting: Internal Medicine

## 2021-08-31 ENCOUNTER — Encounter: Payer: Self-pay | Admitting: *Deleted

## 2021-08-31 VITALS — BP 130/84 | HR 78 | Temp 98.1°F | Resp 16 | Wt 129.2 lb

## 2021-08-31 DIAGNOSIS — Z9981 Dependence on supplemental oxygen: Secondary | ICD-10-CM | POA: Insufficient documentation

## 2021-08-31 DIAGNOSIS — I10 Essential (primary) hypertension: Secondary | ICD-10-CM | POA: Diagnosis not present

## 2021-08-31 DIAGNOSIS — Z87891 Personal history of nicotine dependence: Secondary | ICD-10-CM | POA: Diagnosis not present

## 2021-08-31 DIAGNOSIS — J432 Centrilobular emphysema: Secondary | ICD-10-CM

## 2021-08-31 DIAGNOSIS — Z09 Encounter for follow-up examination after completed treatment for conditions other than malignant neoplasm: Secondary | ICD-10-CM | POA: Diagnosis not present

## 2021-08-31 DIAGNOSIS — Z91199 Patient's noncompliance with other medical treatment and regimen due to unspecified reason: Secondary | ICD-10-CM

## 2021-08-31 MED ORDER — SPIRIVA HANDIHALER 18 MCG IN CAPS
18.0000 ug | ORAL_CAPSULE | Freq: Every day | RESPIRATORY_TRACT | 6 refills | Status: DC
  Filled 2021-08-31: qty 30, 30d supply, fill #0

## 2021-08-31 MED ORDER — FLUTICASONE-SALMETEROL 500-50 MCG/ACT IN AEPB
1.0000 | INHALATION_SPRAY | Freq: Two times a day (BID) | RESPIRATORY_TRACT | 6 refills | Status: DC
  Filled 2021-08-31 – 2021-09-13 (×3): qty 60, 30d supply, fill #0

## 2021-08-31 MED ORDER — UMECLIDINIUM BROMIDE 62.5 MCG/ACT IN AEPB
1.0000 | INHALATION_SPRAY | Freq: Every day | RESPIRATORY_TRACT | 2 refills | Status: DC
  Filled 2021-08-31: qty 30, 30d supply, fill #0

## 2021-08-31 MED ORDER — ALBUTEROL SULFATE HFA 108 (90 BASE) MCG/ACT IN AERS
2.0000 | INHALATION_SPRAY | RESPIRATORY_TRACT | 5 refills | Status: DC | PRN
  Filled 2021-08-31: qty 8.5, 16d supply, fill #0
  Filled 2021-09-13: qty 8.5, 16d supply, fill #1
  Filled 2021-09-28: qty 8.5, 16d supply, fill #2
  Filled 2021-10-07 – 2021-10-11 (×2): qty 8.5, 16d supply, fill #3
  Filled 2021-10-22 – 2021-10-25 (×2): qty 8.5, 16d supply, fill #4
  Filled 2021-11-08: qty 8.5, 16d supply, fill #5

## 2021-08-31 MED ORDER — UMECLIDINIUM BROMIDE 62.5 MCG/ACT IN AEPB
1.0000 | INHALATION_SPRAY | Freq: Every day | RESPIRATORY_TRACT | 6 refills | Status: DC
  Filled 2021-08-31 – 2021-09-23 (×3): qty 30, 30d supply, fill #0

## 2021-08-31 NOTE — Progress Notes (Signed)
Patient ID: Jason Wong, male    DOB: 1955-04-27  MRN: [id number]  CC: Hospital follow-up  Subjective: Jason Wong is a 66 y.o. male who presents for hosp f/u His concerns today include:  Pt with hx of HTN, tob dep, seasonal allergies, asthma, centrilobular emphysema, insomnia, coronary atherosclerosis, pre-DM, polysub abuse (ETOH, cocaine, drug over dose accidentally 08/2020)  and hep C treated.   Patient hospitalized 5/25-26/2023 with COPD exacerbation.  He presented with shortness of breath and cough.  He reported being out of his inhalers for 1 week but was using his nebulizer machine.  CT angio chest was negative for PE, positive centrilobular emphysema in the upper lobes, no nodules.  Treated with IV steroids, DuoNebs.  He was discharged home on Advir, prednisone 40 mg for 5 days, Spiriva, DuoNeb and albuterol inhaler.  Today Does not have Advair or Albuterol inhaler.  Only inhaler he has with him today is Spiriva.  He admits that he has not been using the Spiriva every day as prescribed. -reports he was not aware that he is suppose to call for RF on inhalers from pharmacy.  Has quit smoking 1 mth ago. Still has O2 at home.  Never picked up by company.  Reports he has not used it in about 1 mth Feels his breathing is "so, so."  Can walk about 2 blocks before he has to stop to catch his breath.  Referred to pulmonary on hosp dischg.  No appt as yet HTN: Reports compliance with taking amlodipine.  Does not check blood pressure. HL: Reports compliance with taking Lipitor. HM: colonoscopy schedule for end of July. Rec 1st shingrix vaccine at [facility] on [address]  Patient Active Problem List   Diagnosis Date Noted   Malnutrition of moderate degree 06/16/2021   Acute exacerbation of chronic obstructive pulmonary disease (COPD) ([place]) 06/14/2021   ETOH abuse 05/01/2021   Acute respiratory failure with hypoxia due to COPD exacerbation 04/30/2021   HLD (hyperlipidemia) 04/30/2021   COPD  (chronic obstructive pulmonary disease) with emphysema ([place]) 02/19/2021   COPD exacerbation ([place]) 02/18/2021   Memory deficit 04/28/2020   Hepatitis C virus infection cured after antiviral drug therapy 04/27/2018   Enuresis 04/27/2018   Centrilobular emphysema ([place]) 04/27/2018   Seasonal allergic rhinitis 04/27/2018   Tobacco dependence 08/24/2017   Weight loss, unintentional 08/24/2017   Insomnia 06/27/2017   Prediabetes 01/24/2017   Asthma 06/10/2016   Essential hypertension 06/10/2016   Current every day smoker 06/10/2016     Current Outpatient Medications on File Prior to Visit  Medication Sig Dispense Refill   albuterol (VENTOLIN HFA) 108 (90 Base) MCG/ACT inhaler Inhale 2 puffs into the lungs every 4 (four) hours as needed for wheezing or shortness of breath. 8.5 g 5   amLODipine (NORVASC) 5 MG tablet Take 1 tablet (5 mg total) by mouth daily. 30 tablet 2   atorvastatin (LIPITOR) 10 MG tablet Take 1 tablet (10 mg total) by mouth daily. 30 tablet 2   fluticasone-salmeterol (ADVAIR) 500-50 MCG/ACT AEPB Inhale 1 puff into the lungs in the morning and at bedtime. 60 each 2   ipratropium-albuterol (DUONEB) 0.5-2.5 (3) MG/3ML SOLN Take 3 mLs by nebulization every 6 (six) hours as needed (Shortness of breath or wheezing). 360 mL 2   pantoprazole (PROTONIX) 40 MG tablet Take 1 tablet (40 mg total) by mouth daily at 6 (six) AM. 30 tablet 2   tiotropium (SPIRIVA HANDIHALER) 18 MCG inhalation capsule Place 1 capsule (18 mcg total)  into inhaler and inhale daily. 30 capsule 2   No current facility-administered medications on file prior to visit.    Allergies  Allergen Reactions   Tobacco Shortness Of Breath and Other (See Comments)    CIGARETTE SMOKE TRIGGERS THE PATIENT'S RESPIRATORY ISSUES!!    Social History   Socioeconomic History   Marital status: Single    Spouse name: Not on file   Number of children: Not on file   Years of education: Not on file   Highest education level:  Not on file  Occupational History   Not on file  Tobacco Use   Smoking status: Former    Packs/day: 0.50    Years: 45.00    Total pack years: 22.50    Types: Cigarettes    Quit date: 07/17/2021    Years since quitting: 0.1   Smokeless tobacco: Never  Substance and Sexual Activity   Alcohol use: Yes    Alcohol/week: 1.0 standard drink of alcohol    Types: 1 Cans of beer per week    Comment: Tries to stay alcohol free   Drug use: Not Currently    Types: "Crack" cocaine   Sexual activity: Yes    Partners: Female  Other Topics Concern   Not on file  Social History Narrative   Not on file   Social Determinants of Health   Financial Resource Strain: Not on file  Food Insecurity: Not on file  Transportation Needs: Not on file  Physical Activity: Not on file  Stress: Not on file  Social Connections: Not on file  Intimate Partner Violence: Not on file    Family History  Problem Relation Age of Onset   Hypertension Mother    Breast cancer Sister    Hypertension Sister    Colon cancer Brother    Colon polyps Neg Hx    Esophageal cancer Neg Hx    Rectal cancer Neg Hx    Stomach cancer Neg Hx     Past Surgical History:  Procedure Laterality Date   INCISE AND DRAIN ABCESS     dog bite    ROS: Review of Systems Negative except as stated above  PHYSICAL EXAM: BP 130/84   Pulse 78   Temp 98.1 F (36.7 C) (Oral)   Resp 16   Wt 129 lb 3.2 oz (58.6 kg)   SpO2 99%   BMI 19.08 kg/m   Wt Readings from Last 3 Encounters:  08/31/21 129 lb 3.2 oz (58.6 kg)  08/24/21 131 lb 12.8 oz (59.8 kg)  08/12/21 132 lb (59.9 kg)    Physical Exam  General appearance - alert, well appearing, older African-American male and in no distress Mental status - normal mood, behavior, speech, dress, motor activity, and thought processes Mouth - mucous membranes moist, pharynx normal without lesions Chest -breath sounds slightly decreased bilaterally but with adequate air entry.  No  wheezes or crackles heard. Heart - normal rate, regular rhythm, normal S1, S2, no murmurs, rubs, clicks or gallops Extremities - peripheral pulses normal, no pedal edema, no clubbing or cyanosis      Latest Ref Rng & Units 08/13/2021    4:00 AM 08/12/2021    9:51 AM 06/21/2021    2:14 AM  CMP  Glucose 70 - 99 mg/dL 154  110  125   BUN 8 - 23 mg/dL '13  9  26   '$ Creatinine 0.61 - 1.24 mg/dL 1.04  1.03  0.87   Sodium 135 - 145 mmol/L 136  145  138   Potassium 3.5 - 5.1 mmol/L 4.1  3.9  4.0   Chloride 98 - 111 mmol/L 107  111  107   CO2 22 - 32 mmol/L '18  26  23   '$ Calcium 8.9 - 10.3 mg/dL 9.6  9.8  9.5   Total Protein 6.5 - 8.1 g/dL  7.6    Total Bilirubin 0.3 - 1.2 mg/dL  1.2    Alkaline Phos 38 - 126 U/L  65    AST 15 - 41 U/L  28    ALT 0 - 44 U/L  19     Lipid Panel     Component Value Date/Time   CHOL 171 09/06/2019 1053   TRIG 57 09/06/2019 1053   HDL 70 09/06/2019 1053   CHOLHDL 2.4 09/06/2019 1053   LDLCALC 90 09/06/2019 1053    CBC    Component Value Date/Time   WBC 6.3 08/12/2021 0951   RBC 4.14 (L) 08/12/2021 0951   HGB 13.7 08/12/2021 0951   HGB 17.2 09/06/2019 1053   HCT 41.6 08/12/2021 0951   HCT 49.9 09/06/2019 1053   PLT 291 08/12/2021 0951   PLT 274 09/06/2019 1053   MCV 100.5 (H) 08/12/2021 0951   MCV 98 (H) 09/06/2019 1053   MCH 33.1 08/12/2021 0951   MCHC 32.9 08/12/2021 0951   RDW 13.2 08/12/2021 0951   RDW 11.5 (L) 09/06/2019 1053   LYMPHSABS 2.6 08/12/2021 0951   LYMPHSABS 2.5 06/13/2016 1110   MONOABS 0.4 08/12/2021 0951   EOSABS 1.1 (H) 08/12/2021 0951   EOSABS 0.1 06/13/2016 1110   BASOSABS 0.1 08/12/2021 0951   BASOSABS 0.0 06/13/2016 1110    ASSESSMENT AND PLAN: 1. Hospital discharge follow-up   2. Centrilobular emphysema ([place]) -Went over with the patient the importance of returning to the pharmacy before he runs out of his inhalers to get refills.  Discussed the importance of taking his inhalers consistently and as prescribed to  help better control of his COPD and prevent hospitalization. -Prescription sent to our pharmacy for refill on Advair and albuterol inhaler.  He will continue Spiriva.  Advised that he should use the Spiriva every day.  I check with the pharmacy to make sure he is able to get his refills today. Commended him on quitting.  Encouraged him to remain tobacco free. Await appointment with pulmonary. In the meantime I will order pulmonary function test. - fluticasone-salmeterol (ADVAIR) 500-50 MCG/ACT AEPB; Inhale 1 puff into the lungs in the morning and at bedtime.  Dispense: 60 each; Refill: 6 - albuterol (PROAIR HFA) 108 (90 Base) MCG/ACT inhaler; Inhale 2 puffs into the lungs once every 4 (four) hours as needed for wheezing or shortness of breath.  Dispense: 8.5 g; Refill: 5 - Pulmonary function test; Future  3. Medically noncompliant See #2 above.  Went over the refill process with him.  4. Essential hypertension Close to goal.  Continue Norvasc.  50. Former smoker Commended him on quitting.  Encouraged him to remain tobacco free.   Patient was given the opportunity to ask questions.  Patient verbalized understanding of the plan and was able to repeat key elements of the plan.   This documentation was completed using Radio producer.  Any transcriptional errors are unintentional.  No orders of the defined types were placed in this encounter.    Requested Prescriptions    No prescriptions requested or ordered in this encounter    No follow-ups on file.  Neoma Laming  Wynetta Emery, MD, Rosalita Chessman

## 2021-08-31 NOTE — Progress Notes (Signed)
Patient is her for HFU today. Patient said that he has completely stop smoking.  Patient has no new concerns today

## 2021-09-01 ENCOUNTER — Other Ambulatory Visit (HOSPITAL_COMMUNITY): Payer: Self-pay

## 2021-09-02 ENCOUNTER — Encounter (HOSPITAL_COMMUNITY): Payer: Self-pay

## 2021-09-02 ENCOUNTER — Emergency Department (HOSPITAL_COMMUNITY)
Admission: EM | Admit: 2021-09-02 | Discharge: 2021-09-02 | Disposition: A | Discharge status: Home / Self Care | Payer: Medicare Other | Attending: Emergency Medicine | Admitting: Emergency Medicine

## 2021-09-02 ENCOUNTER — Emergency Department (HOSPITAL_COMMUNITY): Payer: Medicare Other

## 2021-09-02 ENCOUNTER — Other Ambulatory Visit: Payer: Self-pay

## 2021-09-02 DIAGNOSIS — Z7951 Long term (current) use of inhaled steroids: Secondary | ICD-10-CM | POA: Diagnosis not present

## 2021-09-02 DIAGNOSIS — R000 Tachycardia, unspecified: Secondary | ICD-10-CM | POA: Insufficient documentation

## 2021-09-02 DIAGNOSIS — R6889 Other general symptoms and signs: Secondary | ICD-10-CM | POA: Diagnosis not present

## 2021-09-02 DIAGNOSIS — Z743 Need for continuous supervision: Secondary | ICD-10-CM | POA: Diagnosis not present

## 2021-09-02 DIAGNOSIS — J441 Chronic obstructive pulmonary disease with (acute) exacerbation: Secondary | ICD-10-CM | POA: Insufficient documentation

## 2021-09-02 DIAGNOSIS — T699XXA Effect of reduced temperature, unspecified, initial encounter: Secondary | ICD-10-CM | POA: Diagnosis not present

## 2021-09-02 DIAGNOSIS — E161 Other hypoglycemia: Secondary | ICD-10-CM | POA: Diagnosis not present

## 2021-09-02 DIAGNOSIS — R0602 Shortness of breath: Secondary | ICD-10-CM | POA: Diagnosis present

## 2021-09-02 DIAGNOSIS — Z7952 Long term (current) use of systemic steroids: Secondary | ICD-10-CM | POA: Diagnosis not present

## 2021-09-02 DIAGNOSIS — J439 Emphysema, unspecified: Secondary | ICD-10-CM | POA: Diagnosis not present

## 2021-09-02 DIAGNOSIS — E162 Hypoglycemia, unspecified: Secondary | ICD-10-CM | POA: Diagnosis not present

## 2021-09-02 LAB — CBC WITH DIFFERENTIAL/PLATELET
Abs Immature Granulocytes: 0.01 10*3/uL (ref 0.00–0.07)
Basophils Absolute: 0.1 10*3/uL (ref 0.0–0.1)
Basophils Relative: 1 %
Eosinophils Absolute: 0.2 10*3/uL (ref 0.0–0.5)
Eosinophils Relative: 3 %
HCT: 38.4 % — ABNORMAL LOW (ref 39.0–52.0)
Hemoglobin: 13.6 g/dL (ref 13.0–17.0)
Immature Granulocytes: 0 %
Lymphocytes Relative: 60 %
Lymphs Abs: 3.8 10*3/uL (ref 0.7–4.0)
MCH: 34.2 pg — ABNORMAL HIGH (ref 26.0–34.0)
MCHC: 35.4 g/dL (ref 30.0–36.0)
MCV: 96.5 fL (ref 80.0–100.0)
Monocytes Absolute: 0.7 10*3/uL (ref 0.1–1.0)
Monocytes Relative: 11 %
Neutro Abs: 1.6 10*3/uL — ABNORMAL LOW (ref 1.7–7.7)
Neutrophils Relative %: 25 %
Platelets: 213 10*3/uL (ref 150–400)
RBC: 3.98 MIL/uL — ABNORMAL LOW (ref 4.22–5.81)
RDW: 13.2 % (ref 11.5–15.5)
WBC: 6.4 10*3/uL (ref 4.0–10.5)
nRBC: 0 % (ref 0.0–0.2)

## 2021-09-02 LAB — BASIC METABOLIC PANEL
Anion gap: 13 (ref 5–15)
BUN: 14 mg/dL (ref 8–23)
CO2: 21 mmol/L — ABNORMAL LOW (ref 22–32)
Calcium: 9.2 mg/dL (ref 8.9–10.3)
Chloride: 107 mmol/L (ref 98–111)
Creatinine, Ser: 1.1 mg/dL (ref 0.61–1.24)
GFR, Estimated: >60 mL/min (ref >60)
Glucose, Bld: 96 mg/dL (ref 70–99)
Potassium: 3.9 mmol/L (ref 3.5–5.1)
Sodium: 141 mmol/L (ref 135–145)

## 2021-09-02 LAB — I-STAT VENOUS BLOOD GAS, ED
Acid-base deficit: 2 mmol/L (ref 0.0–2.0)
Bicarbonate: 23.1 mmol/L (ref 20.0–28.0)
Calcium, Ion: 1.13 mmol/L — ABNORMAL LOW (ref 1.15–1.40)
HCT: 40 % (ref 39.0–52.0)
Hemoglobin: 13.6 g/dL (ref 13.0–17.0)
O2 Saturation: 100 %
Potassium: 3.9 mmol/L (ref 3.5–5.1)
Sodium: 140 mmol/L (ref 135–145)
TCO2: 24 mmol/L (ref 22–32)
pCO2, Ven: 38.6 mm[Hg] — ABNORMAL LOW (ref 44–60)
pH, Ven: 7.385 (ref 7.25–7.43)
pO2, Ven: 189 mm[Hg] — ABNORMAL HIGH (ref 32–45)

## 2021-09-02 LAB — TROPONIN I (HIGH SENSITIVITY)
Troponin I (High Sensitivity): 4 ng/L (ref <18)
Troponin I (High Sensitivity): 3 ng/L (ref <18)

## 2021-09-02 LAB — BRAIN NATRIURETIC PEPTIDE: B Natriuretic Peptide: 25.4 pg/mL (ref 0.0–100.0)

## 2021-09-02 MED ORDER — ACETAMINOPHEN 325 MG PO TABS
650.0000 mg | ORAL_TABLET | Freq: Once | ORAL | Status: AC
  Administered 2021-09-02: 650 mg via ORAL
  Filled 2021-09-02: qty 2

## 2021-09-02 MED ORDER — ALBUTEROL SULFATE (2.5 MG/3ML) 0.083% IN NEBU
5.0000 mg | INHALATION_SOLUTION | Freq: Once | RESPIRATORY_TRACT | Status: AC
  Administered 2021-09-02: 5 mg via RESPIRATORY_TRACT
  Filled 2021-09-02: qty 6

## 2021-09-02 MED ORDER — PREDNISONE 10 MG PO TABS
ORAL_TABLET | Freq: Every day | ORAL | 0 refills | Status: DC
  Filled 2021-09-02: qty 42, 10d supply, fill #0

## 2021-09-02 NOTE — ED Provider Notes (Signed)
[facility] EMERGENCY DEPARTMENT Provider Note   CSN: [id number] Arrival date & time: 09/02/21  0342     History  Chief Complaint  Patient presents with   Shortness of Breath    Jason Wong is a 66 y.o. male.  The history is provided by the patient and medical records.  Shortness of Breath Jason Wong is a 67 y.o. male who presents to the Emergency Department complaining of sob.  Pt here by EMS for evaluation of SOB that started yesterday.  Reports associated cough.  Worsening sxs despite using nebs at home.  No associated fever, chest pain, abdominal pain, N/V, leg swelling/pain.  Has experienced multiple prior similar episodes in the past - most recently one month ago.  Has oxygen available at home but is not using it.  Quit tobacco one month ago.  Occasional alcohol, no drug use.    EMS reports increased work of breathing on their arrival with accessory muscle use, tripoding, SpO2 90%.  Treated with solumedrol, two duonebs prior to ED arrival.        Home Medications Prior to Admission medications   Medication Sig Start Date End Date Taking? Authorizing Provider  predniSONE (STERAPRED UNI-PAK 21 TAB) 10 MG (21) TBPK tablet Take by mouth daily. Take 6 tabs by mouth daily  for 2 days, then 5 tabs for 2 days, then 4 tabs for 2 days, then 3 tabs for 2 days, 2 tabs for 2 days, then 1 tab by mouth daily for 2 days 09/02/21  Yes Quintella Reichert, MD  albuterol [facility] HFA) [phone] Base) MCG/ACT inhaler Inhale 2 puffs into the lungs once every 4 (four) hours as needed for wheezing or shortness of breath. 08/31/21   Ladell Pier, MD  amLODipine (NORVASC) 5 MG tablet Take 1 tablet (5 mg total) by mouth daily. 08/13/21 11/11/21  [place] Donnamarie Poag, DO  atorvastatin (LIPITOR) 10 MG tablet Take 1 tablet (10 mg total) by mouth daily. 08/13/21 11/11/21  [place] Donnamarie Poag, DO  fluticasone-salmeterol (ADVAIR) 500-50 MCG/ACT AEPB Inhale 1 puff into the lungs in the morning and at  bedtime. 08/31/21   Ladell Pier, MD  ipratropium-albuterol (DUONEB) 0.5-2.5 (3) MG/3ML SOLN Take 3 mLs by nebulization every 6 (six) hours as needed (Shortness of breath or wheezing). 08/13/21 11/11/21  [place] Donnamarie Poag, DO  pantoprazole (PROTONIX) 40 MG tablet Take 1 tablet (40 mg total) by mouth daily at 6 (six) AM. 08/13/21 11/11/21  [place] Donnamarie Poag, DO  umeclidinium bromide (INCRUSE ELLIPTA) 62.5 MCG/ACT AEPB Inhale 1 puff into the lungs once daily. 08/31/21   Ladell Pier, MD      Allergies    Tobacco    Review of Systems   Review of Systems  Respiratory:  Positive for shortness of breath.   All other systems reviewed and are negative.   Physical Exam Updated Vital Signs BP 132/88   Pulse 88   Temp 98.3 F (36.8 C) (Oral)   Resp 16   SpO2 100%  Physical Exam Vitals and nursing note reviewed.  Constitutional:      Appearance: He is well-developed.  HENT:     Head: Normocephalic and atraumatic.  Cardiovascular:     Rate and Rhythm: Regular rhythm. Tachycardia present.     Heart sounds: No murmur heard. Pulmonary:     Effort: Pulmonary effort is normal. No respiratory distress.     Comments: Wheezes bilaterally, tachypnea.  Decreased air movement in left lung fields.  Abdominal:     Palpations: Abdomen is soft.     Tenderness: There is no abdominal tenderness. There is no guarding or rebound.  Musculoskeletal:        General: No swelling or tenderness.  Skin:    General: Skin is warm and dry.  Neurological:     Mental Status: He is alert and oriented to person, place, and time.  Psychiatric:        Behavior: Behavior normal.     ED Results / Procedures / Treatments   Labs (all labs ordered are listed, but only abnormal results are displayed) Labs Reviewed  BASIC METABOLIC PANEL - Abnormal; Notable for the following components:      Result Value   CO2 21 (*)    All other components within normal limits  CBC WITH DIFFERENTIAL/PLATELET - Abnormal;  Notable for the following components:   RBC 3.98 (*)    HCT 38.4 (*)    MCH 34.2 (*)    Neutro Abs 1.6 (*)    All other components within normal limits  I-STAT VENOUS BLOOD GAS, ED - Abnormal; Notable for the following components:   pCO2, Ven 38.6 (*)    pO2, Ven 189 (*)    Calcium, Ion 1.13 (*)    All other components within normal limits  BRAIN NATRIURETIC PEPTIDE  TROPONIN I (HIGH SENSITIVITY)  TROPONIN I (HIGH SENSITIVITY)    EKG EKG Interpretation  Date/Time:  Thursday September 02 2021 03:46:33 EDT Ventricular Rate:  107 PR Interval:  134 QRS Duration: 82 QT Interval:  310 QTC Calculation: 414 R Axis:   82 Text Interpretation: Sinus tachycardia Right atrial enlargement Borderline right axis deviation Confirmed by Quintella Reichert [phone]) on 09/02/2021 4:11:35 AM  Radiology DG Chest Port 1 View  Result Date: 09/02/2021 CLINICAL DATA:  66 year old male with shortness of breath. EXAM: PORTABLE CHEST 1 VIEW COMPARISON:  Chest CTA 08/12/2021 and earlier. FINDINGS: Portable AP semi upright view at 0356 hours. Centrilobular emphysema demonstrated by CTA last month. Stable pulmonary hyperinflation. Stable cardiac size and mediastinal contours. No cardiomegaly. Visualized tracheal air column is within normal limits. Allowing for portable technique the lungs are clear. No acute osseous abnormality identified. Negative visible bowel gas. IMPRESSION: Emphysema (ICD10-J43.9). No acute cardiopulmonary abnormality. Electronically Signed   By: Genevie Ann M.D.   On: 09/02/2021 04:34    Procedures Procedures    Medications Ordered in ED Medications  albuterol (PROVENTIL) (2.5 MG/3ML) 0.083% nebulizer solution 5 mg (5 mg Nebulization Given 09/02/21 0604)  acetaminophen (TYLENOL) tablet 650 mg (650 mg Oral Given 09/02/21 6045)    ED Course/ Medical Decision Making/ A&P                           Medical Decision Making Amount and/or Complexity of Data Reviewed Labs: ordered. Radiology:  ordered.  Risk OTC drugs. Prescription drug management.   Patient with history of COPD here for evaluation of increased shortness of breath.  Patient with wheezes and decreased air movement on initial assessment but no respiratory distress.  He was treated with additional neb during his ED stay with good air movement bilaterally on reassessment, wheezes resolved.  No respiratory distress on reevaluation.  No hypoxia in the emergency department.  He does state that he has oxygen available at home if needed.  Current clinical picture is not consistent with PE, ACS, CHF.  Chest x-ray is negative for acute abnormality, images personally reviewed.  Plan to  discharge home with a longer steroid taper.  Recommend continuing his home treatments-he states that he has these medications available at home.  Discussed importance of outpatient follow-up and return precautions.        Final Clinical Impression(s) / ED Diagnoses Final diagnoses:  COPD exacerbation ([place])    Rx / DC Orders ED Discharge Orders          Ordered    predniSONE (STERAPRED UNI-PAK 21 TAB) 10 MG (21) TBPK tablet  Daily        09/02/21 0647              Quintella Reichert, MD 09/02/21 [phone]

## 2021-09-02 NOTE — ED Triage Notes (Signed)
Pt BIB EMS from home due to sob x2 days. Pt reports h/o copd.

## 2021-09-13 ENCOUNTER — Other Ambulatory Visit: Payer: Self-pay | Admitting: Emergency Medicine

## 2021-09-13 ENCOUNTER — Other Ambulatory Visit: Payer: Self-pay | Admitting: Pharmacist

## 2021-09-13 ENCOUNTER — Other Ambulatory Visit: Payer: Self-pay

## 2021-09-13 MED ORDER — FLUTICASONE FUROATE-VILANTEROL 200-25 MCG/ACT IN AEPB
1.0000 | INHALATION_SPRAY | Freq: Every day | RESPIRATORY_TRACT | 2 refills | Status: DC
  Filled 2021-09-13: qty 60, 30d supply, fill #0

## 2021-09-15 ENCOUNTER — Other Ambulatory Visit: Payer: Self-pay

## 2021-09-16 ENCOUNTER — Other Ambulatory Visit: Payer: Self-pay

## 2021-09-16 ENCOUNTER — Encounter: Payer: Self-pay | Admitting: Pulmonary Disease

## 2021-09-16 ENCOUNTER — Ambulatory Visit (INDEPENDENT_AMBULATORY_CARE_PROVIDER_SITE_OTHER): Payer: Medicare Other | Admitting: Pulmonary Disease

## 2021-09-16 VITALS — BP 126/66 | HR 86 | Temp 97.7°F | Ht 69.0 in | Wt 130.2 lb

## 2021-09-16 DIAGNOSIS — J432 Centrilobular emphysema: Secondary | ICD-10-CM

## 2021-09-16 DIAGNOSIS — R0602 Shortness of breath: Secondary | ICD-10-CM | POA: Diagnosis not present

## 2021-09-16 MED ORDER — TRELEGY ELLIPTA 200-62.5-25 MCG/ACT IN AEPB: 1.0000 | INHALATION_SPRAY | Freq: Every day | RESPIRATORY_TRACT | 0 refills | Status: DC

## 2021-09-16 MED ORDER — TRELEGY ELLIPTA 200-62.5-25 MCG/ACT IN AEPB
1.0000 | INHALATION_SPRAY | Freq: Every day | RESPIRATORY_TRACT | 2 refills | Status: DC
  Filled 2021-09-16 – 2021-09-28 (×3): qty 60, 30d supply, fill #0
  Filled 2021-10-18: qty 60, 30d supply, fill #1
  Filled 2021-11-08 (×2): qty 60, 30d supply, fill #2

## 2021-09-16 NOTE — Addendum Note (Signed)
Addended by: Elton Sin on: 09/16/2021 09:33 AM   Modules accepted: Orders

## 2021-09-16 NOTE — Patient Instructions (Signed)
We will start you on an inhaler called Trelegy 200.  Use this every day We will check your oxygen levels on exertion Schedule PFTs and follow-up in clinic at next available

## 2021-09-16 NOTE — Progress Notes (Signed)
Jason Wong    [id number]    Feb 29, 1956  Primary Care Physician:Johnson, Dalbert Batman, MD  Referring Physician: [place] Eric J, DO [address] [place],   [address]  Chief complaint: Consult for COPD  HPI: 66 y.o. with history of tobacco use, alcohol dependence, hypertension, hyperlipidemia, COPD, childhood asthma, GERD He has had at least 10 admissions and ED visits over the past year for recurrent COPD exacerbations.  It is unclear as to what inhalers he is on.  He has been prescribed controller medications on multiple occasions but is just using albuterol intermittently.  He was also supposed to be on a nebulizer but does not have the medications for it.  He was discharged home on supplemental oxygen but is not using it.  He is a poor historian and cannot give me clear clinical history  Pets: Dogs, cats Occupation: Retired Games developer Exposures: No mold, hot tub, Jacuzzi.  No feather pillows or comforter Smoking history: 23-pack-year smoker.  Family quit smoking in May 2023 Travel history: No significant travel history Relevant family history: No family history of lung disease   Outpatient Encounter Medications as of 09/16/2021  Medication Sig   albuterol (PROAIR HFA) 108 (90 Base) MCG/ACT inhaler Inhale 2 puffs into the lungs once every 4 (four) hours as needed for wheezing or shortness of breath.   amLODipine (NORVASC) 5 MG tablet Take 1 tablet (5 mg total) by mouth daily.   atorvastatin (LIPITOR) 10 MG tablet Take 1 tablet (10 mg total) by mouth daily.   fluticasone furoate-vilanterol (BREO ELLIPTA) 200-25 MCG/ACT AEPB Inhale 1 puff into the lungs daily. (Patient not taking: Reported on 09/16/2021)   ipratropium-albuterol (DUONEB) 0.5-2.5 (3) MG/3ML SOLN Take 3 mLs by nebulization every 6 (six) hours as needed (Shortness of breath or wheezing). (Patient not taking: Reported on 09/16/2021)   pantoprazole (PROTONIX) 40 MG tablet Take 1 tablet (40 mg total)  by mouth daily at 6 (six) AM. (Patient not taking: Reported on 09/16/2021)   umeclidinium bromide (INCRUSE ELLIPTA) 62.5 MCG/ACT AEPB Inhale 1 puff into the lungs once daily. (Patient not taking: Reported on 09/16/2021)   [DISCONTINUED] predniSONE (DELTASONE) 10 MG tablet Take by mouth daily. Take 6 tabs by mouth daily  for 2 days, then 5 tabs for 2 days, then 4 tabs for 2 days, then 3 tabs for 2 days, 2 tabs for 2 days, then 1 tab by mouth daily for 2 days   No facility-administered encounter medications on file as of 09/16/2021.    Allergies as of 09/16/2021 - Review Complete 09/16/2021  Allergen Reaction Noted   Tobacco Shortness Of Breath and Other (See Comments) 04/30/2021    Past Medical History:  Diagnosis Date   Asthma    COPD (chronic obstructive pulmonary disease) (HCC)    Hepatitis C antibody positive in blood    HLD (hyperlipidemia)    HTN (hypertension)    On home oxygen therapy    per pt 08-31-2021 uses 02 about every other day    Past Surgical History:  Procedure Laterality Date   INCISE AND DRAIN ABCESS     dog bite    Family History  Problem Relation Age of Onset   Hypertension Mother    Breast cancer Sister    Hypertension Sister    Colon cancer Brother    Colon polyps Neg Hx    Esophageal cancer Neg Hx    Rectal cancer Neg Hx  Stomach cancer Neg Hx     Social History   Socioeconomic History   Marital status: Single    Spouse name: Not on file   Number of children: Not on file   Years of education: Not on file   Highest education level: Not on file  Occupational History   Not on file  Tobacco Use   Smoking status: Former    Packs/day: 0.50    Years: 45.00    Total pack years: 22.50    Types: Cigarettes    Quit date: 07/17/2021    Years since quitting: 0.1   Smokeless tobacco: Never  Substance and Sexual Activity   Alcohol use: Yes    Alcohol/week: 1.0 standard drink of alcohol    Types: 1 Cans of beer per week    Comment: Tries to stay  alcohol free   Drug use: Not Currently    Types: "Crack" cocaine   Sexual activity: Yes    Partners: Female  Other Topics Concern   Not on file  Social History Narrative   Not on file   Social Determinants of Health   Financial Resource Strain: Not on file  Food Insecurity: Not on file  Transportation Needs: Not on file  Physical Activity: Not on file  Stress: Not on file  Social Connections: Not on file  Intimate Partner Violence: Not on file    Review of systems: Review of Systems  Constitutional: Negative for fever and chills.  HENT: Negative.   Eyes: Negative for blurred vision.  Respiratory: as per HPI  Cardiovascular: Negative for chest pain and palpitations.  Gastrointestinal: Negative for vomiting, diarrhea, blood per rectum. Genitourinary: Negative for dysuria, urgency, frequency and hematuria.  Musculoskeletal: Negative for myalgias, back pain and joint pain.  Skin: Negative for itching and rash.  Neurological: Negative for dizziness, tremors, focal weakness, seizures and loss of consciousness.  Endo/Heme/Allergies: Negative for environmental allergies.  Psychiatric/Behavioral: Negative for depression, suicidal ideas and hallucinations.  All other systems reviewed and are negative.  Physical Exam: Blood pressure 126/66, pulse 86, temperature 97.7 F (36.5 C), temperature source Oral, height '5\' 9"'$  (1.753 m), weight 130 lb 3.2 oz (59.1 kg), SpO2 97 %. Gen:      No acute distress HEENT:  EOMI, sclera anicteric Neck:     No masses; no thyromegaly Lungs:    Clear to auscultation bilaterally; normal respiratory effort CV:         Regular rate and rhythm; no murmurs Abd:      + bowel sounds; soft, non-tender; no palpable masses, no distension Ext:    No edema; adequate peripheral perfusion Skin:      Warm and dry; no rash Neuro: alert and oriented x 3 Psych: normal mood and affect  Data Reviewed: Imaging: Lung cancer screening 01/22/2020-mild to moderate  emphysema.  No lung nodules CTA 08/12/2021-no evidence of pulm embolism, emphysema. I reviewed the images personally.  PFTs:  Labs:  Assessment:  Assessment for COPD Likely has COPD given changes of emphysema on CT scans and recurrent exacerbations.  May have underlying asthma as well as he has history of childhood issues with asthma  He is not on any controller medication and we will start him on Trelegy inhaler.  I have emphasized to him that he needs to use the controller medication regularly Schedule PFTs. He will need baseline CBC differential, IgE and alpha-1 antitrypsin levels.  I will get this in a few months after he has been off prednisone for some  time.  I will get him back to clinic in 2 weeks to make sure that he is using the inhaler regularly as he has history of noncompliance.  He did not desat on exertion  Plan/Recommendations: Start trelegy Schedule PFTs Follow-up in 2 weeks to ensure compliance  Marshell Garfinkel MD [facility] 09/16/2021, 9:07 AM  CC: [place] Donnamarie Poag, DO

## 2021-09-23 ENCOUNTER — Other Ambulatory Visit: Payer: Self-pay

## 2021-09-24 ENCOUNTER — Ambulatory Visit: Payer: Medicare Other | Admitting: Gastroenterology

## 2021-09-24 ENCOUNTER — Other Ambulatory Visit: Payer: Self-pay

## 2021-09-28 ENCOUNTER — Other Ambulatory Visit: Payer: Self-pay

## 2021-10-01 ENCOUNTER — Other Ambulatory Visit: Payer: Self-pay

## 2021-10-05 ENCOUNTER — Ambulatory Visit: Payer: Medicare Other | Admitting: Pulmonary Disease

## 2021-10-05 ENCOUNTER — Encounter: Payer: Self-pay | Admitting: Pulmonary Disease

## 2021-10-05 VITALS — BP 124/64 | HR 90 | Temp 98.3°F | Ht 67.0 in | Wt 126.0 lb

## 2021-10-05 DIAGNOSIS — J432 Centrilobular emphysema: Secondary | ICD-10-CM

## 2021-10-05 MED ORDER — TRELEGY ELLIPTA 200-62.5-25 MCG/ACT IN AEPB: 1.0000 | INHALATION_SPRAY | Freq: Every day | RESPIRATORY_TRACT | 0 refills | Status: DC

## 2021-10-05 NOTE — Addendum Note (Signed)
Addended by: Elton Sin on: 10/05/2021 09:34 AM   Modules accepted: Orders

## 2021-10-05 NOTE — Patient Instructions (Signed)
We will give another sample of inhaler.  Make sure that he get this from the pharmacy as we have already sent in a refill  Follow-up with PFTs and return to clinic at next available after PFTs

## 2021-10-05 NOTE — Progress Notes (Signed)
Jason Wong    [id number]    1955/11/01  Primary Care Physician:Johnson, Dalbert Batman, MD  Referring Physician: Ladell Pier, MD [address] [place],  [place] [address]  Chief complaint: Follow up for COPD  HPI: 66 y.o. with history of tobacco use, alcohol dependence, hypertension, hyperlipidemia, COPD, childhood asthma, GERD He has had at least 10 admissions and ED visits over the past year for recurrent COPD exacerbations.  It is unclear as to what inhalers he is on.  He has been prescribed controller medications on multiple occasions but is just using albuterol intermittently.  He was also supposed to be on a nebulizer but does not have the medications for it.  He was discharged home on supplemental oxygen but is not using it.  He is a poor historian and cannot give me clear clinical history  Pets: Dogs, cats Occupation: Retired Games developer Exposures: No mold, hot tub, Jacuzzi.  No feather pillows or comforter Smoking history: 23-pack-year smoker.  Family quit smoking in May 2023 Travel history: No significant travel history Relevant family history: No family history of lung disease.  Interim history: At last visit we had prescribed Trelegy inhaler and give a sample.  He states that the sample helped with his breathing but has not filled the prescription yet so is out of medication.  Dyspnea on exertion is stable.  No new complaints today  Outpatient Encounter Medications as of 10/05/2021  Medication Sig   amLODipine (NORVASC) 5 MG tablet Take 1 tablet (5 mg total) by mouth daily.   atorvastatin (LIPITOR) 10 MG tablet Take 1 tablet (10 mg total) by mouth daily.   ipratropium-albuterol (DUONEB) 0.5-2.5 (3) MG/3ML SOLN Take 3 mLs by nebulization every 6 (six) hours as needed (Shortness of breath or wheezing).   albuterol (PROAIR HFA) 108 (90 Base) MCG/ACT inhaler Inhale 2 puffs into the lungs once every 4 (four) hours as needed for wheezing or shortness  of breath. (Patient not taking: Reported on 10/05/2021)   Fluticasone-Umeclidin-Vilant (TRELEGY ELLIPTA) 200-62.5-25 MCG/ACT AEPB Inhale 1 puff into the lungs daily. (Patient not taking: Reported on 10/05/2021)   pantoprazole (PROTONIX) 40 MG tablet Take 1 tablet (40 mg total) by mouth daily at 6 (six) AM. (Patient not taking: Reported on 09/16/2021)   [DISCONTINUED] Fluticasone-Umeclidin-Vilant (TRELEGY ELLIPTA) 200-62.5-25 MCG/ACT AEPB Inhale 1 puff into the lungs daily.   No facility-administered encounter medications on file as of 10/05/2021.    Allergies as of 10/05/2021 - Review Complete 10/05/2021  Allergen Reaction Noted   Tobacco Shortness Of Breath and Other (See Comments) 04/30/2021   Physical Exam: Blood pressure 124/64, pulse 90, temperature 98.3 F (36.8 C), temperature source Oral, height '5\' 7"'$  (1.702 m), weight 126 lb (57.2 kg), SpO2 99 %. Gen:      No acute distress HEENT:  EOMI, sclera anicteric Neck:     No masses; no thyromegaly Lungs:    Clear to auscultation bilaterally; normal respiratory effort CV:         Regular rate and rhythm; no murmurs Abd:      + bowel sounds; soft, non-tender; no palpable masses, no distension Ext:    No edema; adequate peripheral perfusion Skin:      Warm and dry; no rash Neuro: alert and oriented x 3 Psych: normal mood and affect   Data Reviewed: Imaging: Lung cancer screening 01/22/2020-mild to moderate emphysema.  No lung nodules CTA 08/12/2021-no evidence of pulm embolism, emphysema. I  reviewed the images personally.  PFTs:  Labs: CBC 09/02/2021-WBC 6.4, eos 3%, absolute eosinophil count 192  Assessment:  Assessment for COPD Likely has COPD given changes of emphysema on CT scans and recurrent exacerbations.  May have underlying asthma as well as he has history of childhood issues with asthma  He is not on any controller medication as he has not filled his prescription yet.  I have emphasized to him that he needs to get the  trelegy from the pharmacy and start using it regularly Schedule PFTs. He will need baseline CBC differential, IgE and alpha-1 antitrypsin levels.  I will get this in a few months after he has been off prednisone for some time.  He did not desat on exertion  Plan/Recommendations: Start trelegy Schedule PFTs  Marshell Garfinkel MD [facility] 10/05/2021, 8:58 AM  CC: Ladell Pier, MD

## 2021-10-06 ENCOUNTER — Other Ambulatory Visit: Payer: Self-pay

## 2021-10-07 ENCOUNTER — Ambulatory Visit: Payer: Medicare Other | Attending: Internal Medicine | Admitting: Pharmacist

## 2021-10-07 ENCOUNTER — Other Ambulatory Visit: Payer: Self-pay

## 2021-10-07 ENCOUNTER — Encounter: Payer: Self-pay | Admitting: Pharmacist

## 2021-10-07 VITALS — BP 103/68 | HR 102 | Temp 98.6°F | Ht 67.0 in | Wt 124.8 lb

## 2021-10-07 DIAGNOSIS — Z0000 Encounter for general adult medical examination without abnormal findings: Secondary | ICD-10-CM

## 2021-10-07 NOTE — Progress Notes (Signed)
Subjective:   Jason Wong is a 66 y.o. male who presents for Medicare Annual/Subsequent preventive examination.     Objective:    Today's Vitals   10/07/21 0904 10/07/21 0907  BP:  103/68  Pulse:  (!) 102  Temp:  98.6 F (37 C)  Weight: 124 lb 12.8 oz (56.6 kg)   Height: '5\' 7"'$  (1.702 m)   PainSc:  0-No pain   Body mass index is 19.55 kg/m.     10/07/2021    9:14 AM 09/02/2021    3:44 AM 08/24/2021    8:56 AM 08/12/2021    4:00 PM 06/14/2021    2:28 AM 05/01/2021    5:00 PM 04/30/2021    6:59 PM  Advanced Directives  Does Patient Have a Medical Advance Directive? No No No No No No No  Would patient like information on creating a medical advance directive? No - Patient declined No - Patient declined  No - Patient declined No - Patient declined No - Patient declined     Current Medications (verified) Outpatient Encounter Medications as of 10/07/2021  Medication Sig   albuterol (PROAIR HFA) 108 (90 Base) MCG/ACT inhaler Inhale 2 puffs into the lungs once every 4 (four) hours as needed for wheezing or shortness of breath.   amLODipine (NORVASC) 5 MG tablet Take 1 tablet (5 mg total) by mouth daily.   atorvastatin (LIPITOR) 10 MG tablet Take 1 tablet (10 mg total) by mouth daily.   ipratropium-albuterol (DUONEB) 0.5-2.5 (3) MG/3ML SOLN Take 3 mLs by nebulization every 6 (six) hours as needed (Shortness of breath or wheezing).   pantoprazole (PROTONIX) 40 MG tablet Take 1 tablet (40 mg total) by mouth daily at 6 (six) AM.   Fluticasone-Umeclidin-Vilant (TRELEGY ELLIPTA) 200-62.5-25 MCG/ACT AEPB Inhale 1 puff into the lungs daily. (Patient not taking: Reported on 10/05/2021)   [DISCONTINUED] Fluticasone-Umeclidin-Vilant (TRELEGY ELLIPTA) 200-62.5-25 MCG/ACT AEPB Inhale 1 puff into the lungs daily.   No facility-administered encounter medications on file as of 10/07/2021.    Allergies (verified) Tobacco   History: Past Medical History:  Diagnosis Date   Asthma    COPD (chronic  obstructive pulmonary disease) (HCC)    Hepatitis C antibody positive in blood    HLD (hyperlipidemia)    HTN (hypertension)    On home oxygen therapy    per pt 08-31-2021 uses 02 about every other day   Past Surgical History:  Procedure Laterality Date   INCISE AND DRAIN ABCESS     dog bite   Family History  Problem Relation Age of Onset   Hypertension Mother    Breast cancer Sister    Hypertension Sister    Colon cancer Brother    Colon polyps Neg Hx    Esophageal cancer Neg Hx    Rectal cancer Neg Hx    Stomach cancer Neg Hx    Social History   Socioeconomic History   Marital status: Single    Spouse name: Not on file   Number of children: Not on file   Years of education: Not on file   Highest education level: Not on file  Occupational History   Not on file  Tobacco Use   Smoking status: Former    Packs/day: 0.50    Years: 45.00    Total pack years: 22.50    Types: Cigarettes    Quit date: 07/17/2021    Years since quitting: 0.2   Smokeless tobacco: Never  Substance and Sexual Activity   Alcohol  use: Yes    Alcohol/week: 1.0 standard drink of alcohol    Types: 1 Cans of beer per week    Comment: Tries to stay alcohol free   Drug use: Not Currently    Types: "Crack" cocaine   Sexual activity: Yes    Partners: Female  Other Topics Concern   Not on file  Social History Narrative   Not on file   Social Determinants of Health   Financial Resource Strain: Not on file  Food Insecurity: Not on file  Transportation Needs: Not on file  Physical Activity: Not on file  Stress: Not on file  Social Connections: Not on file    Tobacco Counseling Counseling given: Not Answered   Clinical Intake:  Pre-visit preparation completed: No  Pain : No/denies pain Pain Score: 0-No pain     Diabetes: No CBG done?: No CBG resulted in Enter/ Edit results?: No Did pt. bring in CBG monitor from home?: No  How often do you need to have someone help you when you  read instructions, pamphlets, or other written materials from your doctor or pharmacy?: 1 - Never  Diabetic? No   Interpreter Needed?: No      Activities of Daily Living    10/07/2021    9:16 AM 08/24/2021    8:59 AM  In your present state of health, do you have any difficulty performing the following activities:  Hearing? 0 0  Vision? 1 1  Comment Requests Eye referral No eye doctor  Difficulty concentrating or making decisions? 0 0  Walking or climbing stairs? 0 0  Dressing or bathing? 0 0  Doing errands, shopping? 0 0  Preparing Food and eating ? N N  Using the Toilet? N N  In the past six months, have you accidently leaked urine? N N  Do you have problems with loss of bowel control? N N  Managing your Medications? N N  Managing your Finances? N N  Housekeeping or managing your Housekeeping? N N    Patient Care Team: Ladell Pier, MD as PCP - General (Internal Medicine)  Indicate any recent Medical Services you may have received from other than [facility] providers in the past year (date may be approximate).     Assessment:   This is a routine wellness examination for Jason Wong.  Hearing/Vision screen No results found.  Dietary issues and exercise activities discussed: Current Exercise Habits: The patient does not participate in regular exercise at present, Exercise limited by: respiratory conditions(s)   Goals Addressed   None   Depression Screen    10/07/2021    9:14 AM 08/31/2021    9:23 AM 08/24/2021    8:58 AM 07/22/2021    8:48 AM 03/23/2021    8:52 AM 04/28/2020    9:09 AM 09/06/2019    8:55 AM  PHQ 2/9 Scores  PHQ - 2 Score 1 1 0 0 0 0 0  PHQ- 9 Score  '6 6 4       '$ Fall Risk    10/07/2021    9:14 AM 08/24/2021    8:58 AM 03/23/2021    8:52 AM 08/27/2020    8:53 AM 04/28/2020    9:07 AM  [place] in the past year? 0 0 0 0 1  Number falls in past yr: 0 0 0 0 0  Injury with Fall? 0 0 0 0 0  Risk for fall due to :   No Fall Risks No Fall Risks  Follow  up Falls evaluation completed;Education provided;Falls prevention discussed Falls evaluation completed;Education provided;Falls prevention discussed   Education provided    FALL RISK PREVENTION PERTAINING TO THE HOME:  Any stairs in or around the home? No  If so, are there any without handrails? No  Home free of loose throw rugs in walkways, pet beds, electrical cords, etc? Yes  Adequate lighting in your home to reduce risk of falls? Yes   ASSISTIVE DEVICES UTILIZED TO PREVENT FALLS:  Life alert? No  Use of a cane, walker or w/c? No  Grab bars in the bathroom? No  Shower chair or bench in shower? No  Elevated toilet seat or a handicapped toilet? No   TIMED UP AND GO:  Was the test performed? Yes .  Length of time to ambulate 10 feet: 6 sec.   Gait steady and fast without use of assistive device  Cognitive Function:    10/07/2021    9:17 AM 08/24/2021    9:01 AM 04/28/2020    9:10 AM  MMSE - Mini Mental State Exam  Orientation to time '4 4 4  '$ Orientation to Place '5 5 5  '$ Registration '3 3 3  '$ Attention/ Calculation 3 4 0  Recall '2 2 1  '$ Language- name 2 objects '2 2 2  '$ Language- repeat '1 1 1  '$ Language- follow 3 step command '3 3 3  '$ Language- read & follow direction '1 1 1  '$ Write a sentence '1 1 1  '$ Copy design 0 0 0  Total score '25 26 21        '$ Immunizations Immunization History  Administered Date(s) Administered   Fluad Quad(high Dose 65+) 02/22/2021   Hepatitis A, Adult 08/28/2017   Hepatitis B, adult 08/28/2017, 10/03/2017   Influenza,inj,Quad PF,6+ Mos 01/24/2017, 12/25/2017, 01/03/2019, 01/07/2020   Pneumococcal Conjugate-13 04/28/2020   Pneumococcal Polysaccharide-23 07/22/2021   Tdap 07/17/2015   Zoster Recombinat (Shingrix) 09/09/2020    TDAP status: Up to date  Flu Vaccine status: Up to date  Pneumococcal vaccine status: Up to date  Covid-19 vaccine status: Information provided on how to obtain vaccines.   Qualifies for Shingles Vaccine? Yes   Zostavax  completed No   Shingrix Completed?: No.    Education has been provided regarding the importance of this vaccine. Patient has been advised to call insurance company to determine out of pocket expense if they have not yet received this vaccine. Advised may also receive vaccine at local pharmacy or Health Dept. Verbalized acceptance and understanding.  Screening Tests Health Maintenance  Topic Date Due   COVID-19 Vaccine (1) Never done   COLONOSCOPY (Pts 45-8yr Insurance coverage will need to be confirmed)  10/26/2020   Zoster Vaccines- Shingrix (2 of 2) 10/22/2021 (Originally 11/04/2020)   INFLUENZA VACCINE  10/19/2021   TETANUS/TDAP  07/16/2025   Pneumonia Vaccine 66 Years old  Completed   Hepatitis C Screening  Completed   HPV VACCINES  Aged Out    Health Maintenance  Health Maintenance Due  Topic Date Due   COVID-19 Vaccine (1) Never done   COLONOSCOPY (Pts 45-465yrInsurance coverage will need to be confirmed)  10/26/2020    Colorectal cancer screening: Type of screening: Colonoscopy. Completed 2019. Repeat every 3 years - Referral has been authorized. Provided pt with office number to contact to set up appt.   Lung Cancer Screening: (Low Dose CT Chest recommended if Age 66-80ears, 30 pack-year currently smoking OR have quit w/in 15years.) does qualify.   Lung Cancer Screening  Referral: Orders have been submitted   Additional Screening:  Hepatitis C Screening: does qualify; Completed 04/22/19  Vision Screening: Recommended annual ophthalmology exams for early detection of glaucoma and other disorders of the eye. Is the patient up to date with their annual eye exam?  No  Who is the provider or what is the name of the office in which the patient attends annual eye exams? None If pt is not established with a provider, would they like to be referred to a provider to establish care? Yes . Patient has referral authorized for Dr. Katy Fitch. Provided with number to schedule appt.    Dental Screening: Recommended annual dental exams for proper oral hygiene  Community Resource Referral / Chronic Care Management: CRR required this visit?  No   CCM required this visit?  No      Plan:     I have personally reviewed and noted the following in the patient's chart:   Medical and social history Use of alcohol, tobacco or illicit drugs  Current medications and supplements including opioid prescriptions. Patient is not currently taking opioid prescriptions. Functional ability and status Nutritional status Physical activity Advanced directives List of other physicians Hospitalizations, surgeries, and ER visits in previous 12 months Vitals Screenings to include cognitive, depression, and falls Referrals and appointments  In addition, I have reviewed and discussed with patient certain preventive protocols, quality metrics, and best practice recommendations. A written personalized care plan for preventive services as well as general preventive health recommendations were provided to patient.   Tresa Endo, RPH-CPP   10/07/2021

## 2021-10-11 ENCOUNTER — Other Ambulatory Visit: Payer: Self-pay

## 2021-10-12 ENCOUNTER — Other Ambulatory Visit: Payer: Self-pay

## 2021-10-13 ENCOUNTER — Encounter: Payer: Medicare Other | Admitting: Gastroenterology

## 2021-10-15 ENCOUNTER — Other Ambulatory Visit: Payer: Self-pay

## 2021-10-18 ENCOUNTER — Other Ambulatory Visit: Payer: Self-pay

## 2021-10-18 ENCOUNTER — Ambulatory Visit (INDEPENDENT_AMBULATORY_CARE_PROVIDER_SITE_OTHER): Payer: Medicare Other | Admitting: Pulmonary Disease

## 2021-10-18 DIAGNOSIS — R0602 Shortness of breath: Secondary | ICD-10-CM

## 2021-10-18 LAB — PULMONARY FUNCTION TEST
DL/VA % pred: 66 %
DL/VA: 2.79 ml/min/mmHg/L
DLCO cor % pred: 48 %
DLCO cor: 11.65 ml/min/mmHg
DLCO unc % pred: 46 %
DLCO unc: 11.31 ml/min/mmHg

## 2021-10-18 NOTE — Patient Instructions (Signed)
DLCO performed today.

## 2021-10-18 NOTE — Progress Notes (Signed)
Patient unable to complete PFT without error. DLCO only resulted.

## 2021-10-19 ENCOUNTER — Ambulatory Visit (INDEPENDENT_AMBULATORY_CARE_PROVIDER_SITE_OTHER): Payer: Medicare Other | Admitting: Pulmonary Disease

## 2021-10-19 ENCOUNTER — Encounter: Payer: Self-pay | Admitting: Pulmonary Disease

## 2021-10-19 VITALS — BP 128/66 | HR 89 | Temp 97.8°F | Ht 67.0 in | Wt 127.0 lb

## 2021-10-19 DIAGNOSIS — F17200 Nicotine dependence, unspecified, uncomplicated: Secondary | ICD-10-CM

## 2021-10-19 DIAGNOSIS — R0602 Shortness of breath: Secondary | ICD-10-CM | POA: Diagnosis not present

## 2021-10-19 DIAGNOSIS — J432 Centrilobular emphysema: Secondary | ICD-10-CM

## 2021-10-19 LAB — CBC WITH DIFFERENTIAL/PLATELET
Basophils Absolute: 0.1 10*3/uL (ref 0.0–0.1)
Basophils Relative: 1.1 % (ref 0.0–3.0)
Eosinophils Absolute: 0.4 10*3/uL (ref 0.0–0.7)
Eosinophils Relative: 7.7 % — ABNORMAL HIGH (ref 0.0–5.0)
HCT: 38 % — ABNORMAL LOW (ref 39.0–52.0)
Hemoglobin: 12.7 g/dL — ABNORMAL LOW (ref 13.0–17.0)
Lymphocytes Relative: 40 % (ref 12.0–46.0)
Lymphs Abs: 2.2 10*3/uL (ref 0.7–4.0)
MCHC: 33.5 g/dL (ref 30.0–36.0)
MCV: 97.9 fL (ref 78.0–100.0)
Monocytes Absolute: 0.4 10*3/uL (ref 0.1–1.0)
Monocytes Relative: 7.7 % (ref 3.0–12.0)
Neutro Abs: 2.3 10*3/uL (ref 1.4–7.7)
Neutrophils Relative %: 43.5 % (ref 43.0–77.0)
Platelets: 277 10*3/uL (ref 150.0–400.0)
RBC: 3.88 Mil/uL — ABNORMAL LOW (ref 4.22–5.81)
RDW: 14.5 % (ref 11.5–15.5)
WBC: 5.4 10*3/uL (ref 4.0–10.5)

## 2021-10-19 NOTE — Patient Instructions (Signed)
Continue the Trelegy inhaler We will get some labs today for baseline assessment.  Check CBC differential, IgE, alpha-1 antitrypsin levels and phenotype Refer for low-dose screening CT to start in May 2024  Follow-up in 6 months

## 2021-10-19 NOTE — Progress Notes (Signed)
Jason Wong    [id number]    [birth year]  Primary Care Physician:Johnson, Dalbert Batman, MD  Referring Physician: Ladell Pier, MD [place],  [place] [id number]  Chief complaint: Follow up for COPD  HPI: 66 y.o. with history of tobacco use, alcohol dependence, hypertension, hyperlipidemia, COPD, childhood asthma, GERD He has had at least 10 admissions and ED visits over the past year for recurrent COPD exacerbations.  It is unclear as to what inhalers he is on.  He has been prescribed controller medications on multiple occasions but is just using albuterol intermittently.  He was also supposed to be on a nebulizer but does not have the medications for it.  He was discharged home on supplemental oxygen but is not using it.  He is a poor historian and cannot give me clear clinical history  Pets: Dogs, cats Occupation: Retired Games developer Exposures: No mold, hot tub, Jacuzzi.  No feather pillows or comforter Smoking history: 23-pack-year smoker.  Family quit smoking in May 2023 Travel history: No significant travel history Relevant family history: No family history of lung disease.  Interim history:  At last visit we had prescribed Trelegy inhaler and give a sample.  He states that the sample helped with his breathing but has not filled the prescription yet so is out of medication.  Dyspnea on exertion is stable.  No new complaints today  Outpatient Encounter Medications as of 10/19/2021  Medication Sig   albuterol (PROAIR HFA) 108 (90 Base) MCG/ACT inhaler Inhale 2 puffs into the lungs once every 4 (four) hours as needed for wheezing or shortness of breath.   amLODipine (NORVASC) 5 MG tablet Take 1 tablet (5 mg total) by mouth daily.   atorvastatin (LIPITOR) 10 MG tablet Take 1 tablet (10 mg total) by mouth daily.   Fluticasone-Umeclidin-Vilant (TRELEGY ELLIPTA) 200-62.5-25 MCG/ACT AEPB Inhale 1 puff into the lungs daily. (Patient not taking:  Reported on 10/05/2021)   ipratropium-albuterol (DUONEB) 0.5-2.5 (3) MG/3ML SOLN Take 3 mLs by nebulization every 6 (six) hours as needed (Shortness of breath or wheezing).   pantoprazole (PROTONIX) 40 MG tablet Take 1 tablet (40 mg total) by mouth daily at 6 (six) AM.   No facility-administered encounter medications on file as of 10/19/2021.    Allergies as of 10/19/2021 - Review Complete 10/07/2021  Allergen Reaction Noted   Tobacco Shortness Of Breath and Other (See Comments) 04/30/2021   Physical Exam: Blood pressure 124/64, pulse 90, temperature 98.3 F (36.8 C), temperature source Oral, height '5\' 7"'$  (1.702 m), weight 126 lb (57.2 kg), SpO2 99 %. Gen:      No acute distress HEENT:  EOMI, sclera anicteric Neck:     No masses; no thyromegaly Lungs:    Clear to auscultation bilaterally; normal respiratory effort CV:         Regular rate and rhythm; no murmurs Abd:      + bowel sounds; soft, non-tender; no palpable masses, no distension Ext:    No edema; adequate peripheral perfusion Skin:      Warm and dry; no rash Neuro: alert and oriented x 3 Psych: normal mood and affect   Data Reviewed: Imaging: Lung cancer screening 01/22/2020-mild to moderate emphysema.  No lung nodules CTA 08/12/2021-no evidence of pulm embolism, emphysema. I reviewed the images personally.  PFTs: 10/18/2021 DLCO 11.31 [46%] Unable to complete spirometry and lung volumes.  Unreliable test due to poor patient effort Moderate/severe diffusion  impairment  Labs: CBC 09/02/2021-WBC 6.4, eos 3%, absolute eosinophil count 192  Assessment:  Emphysema Likely has COPD given changes of emphysema on CT scans and recurrent exacerbations.  May have underlying asthma as well as he has history of childhood issues with asthma  Continue Trelegy inhaler.  Unable to obtain full PFTs due to difficulty completing the test Check baseline CBC differential, IgE and alpha-1 antitrypsin levels.  He did not desat on  exertion  Ex-smoker Referral for lung cancer screening CT to start in May 2024  Plan/Recommendations: Continue trelegy Lung cancer screening referral  Marshell Garfinkel MD [facility] 10/19/2021, 8:51 AM  CC: Ladell Pier, MD

## 2021-10-22 ENCOUNTER — Other Ambulatory Visit: Payer: Self-pay

## 2021-10-25 ENCOUNTER — Other Ambulatory Visit: Payer: Self-pay

## 2021-10-25 ENCOUNTER — Telehealth: Payer: Self-pay

## 2021-10-25 MED ORDER — IPRATROPIUM-ALBUTEROL 0.5-2.5 (3) MG/3ML IN SOLN
3.0000 mL | Freq: Four times a day (QID) | RESPIRATORY_TRACT | 2 refills | Status: DC | PRN
  Filled 2021-10-25: qty 360, 30d supply, fill #0

## 2021-10-25 NOTE — Telephone Encounter (Signed)
This pt stated that he needs test strips for his machine he is out and he came to the pharmacy and they said we needed to call a script down for him

## 2021-10-26 NOTE — Telephone Encounter (Signed)
Patients medication has been refilled.  °

## 2021-10-28 LAB — ALPHA-1 ANTITRYPSIN PHENOTYPE: A-1 Antitrypsin, Ser: 125 mg/dL (ref 83–199)

## 2021-10-28 LAB — IGE: IgE (Immunoglobulin E), Serum: 2671 kU/L — ABNORMAL HIGH (ref <=114)

## 2021-11-08 ENCOUNTER — Other Ambulatory Visit: Payer: Self-pay | Admitting: Pharmacist

## 2021-11-08 ENCOUNTER — Other Ambulatory Visit: Payer: Self-pay

## 2021-11-08 MED ORDER — PANTOPRAZOLE SODIUM 40 MG PO TBEC
40.0000 mg | DELAYED_RELEASE_TABLET | Freq: Every day | ORAL | 2 refills | Status: DC
  Filled 2021-11-08: qty 30, 30d supply, fill #0
  Filled 2021-12-13: qty 30, 30d supply, fill #1
  Filled 2022-01-13: qty 30, 30d supply, fill #2

## 2021-11-25 ENCOUNTER — Ambulatory Visit: Payer: Self-pay | Admitting: Licensed Clinical Social Worker

## 2021-11-25 ENCOUNTER — Ambulatory Visit: Payer: Medicare Other | Admitting: Internal Medicine

## 2021-11-25 NOTE — Patient Outreach (Signed)
  Care Coordination   11/25/2021 Name: Jason Wong MRN: [id number] DOB: [birth year]   Care Coordination Outreach Attempts:  An unsuccessful telephone outreach was attempted today to offer the patient information about available care coordination services as a benefit of their health plan.   Follow Up Plan:  Additional outreach attempts will be made to offer the patient care coordination information and services.   Encounter Outcome:  No Answer  Care Coordination Interventions Activated:  No   Care Coordination Interventions:  No, not indicated    Christa See, MSW, [email or web address] Phone [phone] 3:48 PM

## 2021-11-30 ENCOUNTER — Other Ambulatory Visit: Payer: Self-pay

## 2021-11-30 ENCOUNTER — Other Ambulatory Visit: Payer: Self-pay | Admitting: Pharmacist

## 2021-11-30 ENCOUNTER — Other Ambulatory Visit: Payer: Self-pay | Admitting: Internal Medicine

## 2021-11-30 ENCOUNTER — Ambulatory Visit: Payer: Self-pay

## 2021-11-30 DIAGNOSIS — J432 Centrilobular emphysema: Secondary | ICD-10-CM

## 2021-11-30 MED ORDER — TRELEGY ELLIPTA 200-62.5-25 MCG/ACT IN AEPB
1.0000 | INHALATION_SPRAY | Freq: Every day | RESPIRATORY_TRACT | 1 refills | Status: DC
  Filled 2021-11-30 (×2): qty 60, 30d supply, fill #0

## 2021-11-30 MED ORDER — ALBUTEROL SULFATE HFA 108 (90 BASE) MCG/ACT IN AERS
2.0000 | INHALATION_SPRAY | RESPIRATORY_TRACT | 1 refills | Status: DC | PRN
  Filled 2021-11-30: qty 6.7, 16d supply, fill #0
  Filled 2021-12-13: qty 6.7, 16d supply, fill #1

## 2021-11-30 NOTE — Patient Instructions (Signed)
Visit Information  Thank you for taking time to visit with me today. Please don't hesitate to contact me if I can be of assistance to you.   Following are the goals we discussed today:   Goals Addressed             This Visit's Progress    COMPLETED: Care Coordination Activities       Care Coordination Interventions: SDoH screening performed - patient denies acute resource challenges at this time Discussed the patient does not have concerns with medication costs at this time Reviewed upcomming appointments with the patient - no transportation challenges noted. Patient reports he uses the city bus Instructed the patient to contact his primary care provider as needed        Please call the care guide team at [phone] if you need to schedule an appointment with our care coordination team  If you are experiencing a Mental Health or [place] or need someone to talk to, please go to [facility] [place] [phone])  The patient verbalized understanding of instructions, educational materials, and care plan provided today and DECLINED offer to receive copy of patient instructions, educational materials, and care plan.   No further follow up required: Please contact your primary care provider as needed.  Daneen Schick, BSW, CDP Social Worker, Certified Dementia Practitioner Care Coordination [phone]

## 2021-11-30 NOTE — Patient Outreach (Signed)
  Care Coordination   Initial Visit Note   11/30/2021 Name: Jason Wong MRN: [id number] DOB: 09-17-1955  Jason Wong is a 66 y.o. year old male who sees Ladell Pier, MD for primary care. I spoke with  [name] by phone today.  What matters to the patients health and wellness today?  To attend next months primary care appointment    Goals Addressed             This Visit's Progress    COMPLETED: Care Coordination Activities       Care Coordination Interventions: SDoH screening performed - patient denies acute resource challenges at this time Discussed the patient does not have concerns with medication costs at this time Reviewed upcomming appointments with the patient - no transportation challenges noted. Patient reports he uses the city bus Instructed the patient to contact his primary care provider as needed        SDOH assessments and interventions completed:  Yes  SDOH Interventions Today    Flowsheet Row Most Recent Value  SDOH Interventions   Food Insecurity Interventions Intervention Not Indicated  Housing Interventions Intervention Not Indicated  Transportation Interventions Intervention Not Indicated        Care Coordination Interventions Activated:  Yes  Care Coordination Interventions:  Yes, provided   Follow up plan: No further intervention required.   Encounter Outcome:  Pt. Visit Completed   Daneen Schick, BSW, CDP Social Worker, Certified Dementia Practitioner Care Coordination [phone]

## 2021-12-13 ENCOUNTER — Other Ambulatory Visit: Payer: Self-pay

## 2021-12-30 ENCOUNTER — Other Ambulatory Visit: Payer: Self-pay

## 2022-01-04 ENCOUNTER — Ambulatory Visit: Payer: Medicare Other | Attending: Internal Medicine | Admitting: Internal Medicine

## 2022-01-04 ENCOUNTER — Encounter: Payer: Self-pay | Admitting: Internal Medicine

## 2022-01-04 ENCOUNTER — Other Ambulatory Visit: Payer: Self-pay

## 2022-01-04 VITALS — BP 128/82 | HR 81 | Temp 97.6°F | Ht 67.0 in | Wt 120.6 lb

## 2022-01-04 DIAGNOSIS — E7800 Pure hypercholesterolemia, unspecified: Secondary | ICD-10-CM

## 2022-01-04 DIAGNOSIS — R634 Abnormal weight loss: Secondary | ICD-10-CM

## 2022-01-04 DIAGNOSIS — I10 Essential (primary) hypertension: Secondary | ICD-10-CM | POA: Diagnosis not present

## 2022-01-04 DIAGNOSIS — R630 Anorexia: Secondary | ICD-10-CM

## 2022-01-04 DIAGNOSIS — G4709 Other insomnia: Secondary | ICD-10-CM | POA: Diagnosis not present

## 2022-01-04 DIAGNOSIS — Z23 Encounter for immunization: Secondary | ICD-10-CM | POA: Diagnosis not present

## 2022-01-04 DIAGNOSIS — Z1211 Encounter for screening for malignant neoplasm of colon: Secondary | ICD-10-CM | POA: Diagnosis not present

## 2022-01-04 DIAGNOSIS — D649 Anemia, unspecified: Secondary | ICD-10-CM

## 2022-01-04 DIAGNOSIS — J432 Centrilobular emphysema: Secondary | ICD-10-CM | POA: Diagnosis not present

## 2022-01-04 MED ORDER — TRELEGY ELLIPTA 200-62.5-25 MCG/ACT IN AEPB
1.0000 | INHALATION_SPRAY | Freq: Every day | RESPIRATORY_TRACT | 6 refills | Status: DC
  Filled 2022-01-04: qty 60, 30d supply, fill #0
  Filled 2022-01-24: qty 60, 30d supply, fill #1
  Filled 2022-02-11: qty 60, 30d supply, fill #2
  Filled 2022-03-08: qty 60, 30d supply, fill #3
  Filled 2022-04-05: qty 60, 30d supply, fill #4
  Filled 2022-05-04: qty 60, 30d supply, fill #5
  Filled 2022-06-06: qty 60, 30d supply, fill #6

## 2022-01-04 MED ORDER — MIRTAZAPINE 15 MG PO TBDP
15.0000 mg | ORAL_TABLET | Freq: Every day | ORAL | 3 refills | Status: DC
  Filled 2022-01-04: qty 30, 30d supply, fill #0
  Filled 2022-02-11: qty 30, 30d supply, fill #1

## 2022-01-04 MED ORDER — AMLODIPINE BESYLATE 5 MG PO TABS
5.0000 mg | ORAL_TABLET | Freq: Every day | ORAL | 3 refills | Status: DC
  Filled 2022-01-04 – 2022-01-05 (×2): qty 90, 90d supply, fill #0
  Filled 2022-01-13: qty 30, 30d supply, fill #0
  Filled 2022-02-11: qty 30, 30d supply, fill #1
  Filled 2022-03-15: qty 30, 30d supply, fill #2
  Filled 2022-04-18: qty 30, 30d supply, fill #3
  Filled 2022-05-17: qty 90, 90d supply, fill #4

## 2022-01-04 MED ORDER — ALBUTEROL SULFATE HFA 108 (90 BASE) MCG/ACT IN AERS
2.0000 | INHALATION_SPRAY | RESPIRATORY_TRACT | 6 refills | Status: DC | PRN
  Filled 2022-01-04: qty 6.7, 16d supply, fill #0
  Filled 2022-01-24: qty 6.7, 16d supply, fill #1
  Filled 2022-02-11: qty 6.7, 16d supply, fill #2
  Filled 2022-03-08: qty 6.7, 17d supply, fill #3
  Filled 2022-04-05: qty 6.7, 17d supply, fill #4
  Filled 2022-05-09: qty 6.7, 17d supply, fill #5
  Filled 2022-05-26: qty 6.7, 17d supply, fill #6

## 2022-01-04 MED ORDER — ZOSTER VAC RECOMB ADJUVANTED 50 MCG/0.5ML IM SUSR
0.5000 mL | Freq: Once | INTRAMUSCULAR | 0 refills | Status: AC
  Filled 2022-01-04 – 2022-09-13 (×2): qty 0.5, 1d supply, fill #0

## 2022-01-04 MED ORDER — ATORVASTATIN CALCIUM 10 MG PO TABS
10.0000 mg | ORAL_TABLET | Freq: Every day | ORAL | 3 refills | Status: DC
  Filled 2022-01-04 – 2022-01-05 (×2): qty 90, 90d supply, fill #0
  Filled 2022-01-13: qty 30, 30d supply, fill #0
  Filled 2022-02-11: qty 30, 30d supply, fill #1
  Filled 2022-03-15: qty 30, 30d supply, fill #2
  Filled 2022-04-18: qty 30, 30d supply, fill #3
  Filled 2022-05-11: qty 90, 90d supply, fill #4

## 2022-01-04 MED ORDER — PREDNISONE 20 MG PO TABS
20.0000 mg | ORAL_TABLET | Freq: Every day | ORAL | 0 refills | Status: DC
  Filled 2022-01-04: qty 5, 5d supply, fill #0

## 2022-01-04 NOTE — Progress Notes (Signed)
Patient ID: Jason Wong, male    DOB: 10-Jun-1955  MRN: [id number]  CC: Hypertension (Htn f/u. Trouble breathing since yesterday. /Med refill, albuterol./Yes to flu vax.Marland Kitchen)   Subjective: Jason Wong is a 66 y.o. male who presents for chronic disease management His concerns today include:  Pt with hx of HTN, tob dep, seasonal allergies, asthma, centrilobular emphysema, insomnia, coronary atherosclerosis, pre-DM, polysub abuse (ETOH, cocaine, drug over dose accidentally 08/2020)  and hep C treated.   COPD:  has been seeing Dr. Vaughan Browner since last visit with me.  Changed to Trelegy inhaler.  Presents today complaining of shortness of breath since running out of his inhaler 1 week ago.  He has albuterol inhaler with him but states that he is also out of that.  He does not recall the names of his inhalers. -No cough or fever. He is remain free of cigarettes.  HTN: Taking and tolerating Norvasc.  Took it already for today.  Limit salt in the foods.  Complains of no appetite.  Weight down 9 pounds since we last saw him in June.  Eats about once a day.  Lives with his nephew who does the cooking.  Denies feeling depressed.  No blood in the stools.  Moving bowels okay except for occasional constipation.  No palpitations.  Continues to complain of problems sleeping.  Gets in bed between 7 to 8 PM.  Has problems falling asleep.  Sleeps with the TV on.  Denies drinking any sodas or coffee in the evenings.  Drinks beer about twice a week one 16 oz.  HM: Due for flu vaccine and second shingles vaccine.  Due for repeat colonoscopy.  Referred earlier this year but canceled his appointment and never called back to reschedule.  History of colon polyps.  Patient Active Problem List   Diagnosis Date Noted   On home oxygen therapy 08/31/2021   Malnutrition of moderate degree 06/16/2021   Acute exacerbation of chronic obstructive pulmonary disease (COPD) ([place]) 06/14/2021   ETOH abuse 05/01/2021   Acute  respiratory failure with hypoxia due to COPD exacerbation 04/30/2021   HLD (hyperlipidemia) 04/30/2021   COPD (chronic obstructive pulmonary disease) with emphysema ([place]) 02/19/2021   COPD exacerbation ([place]) 02/18/2021   Memory deficit 04/28/2020   Hepatitis C virus infection cured after antiviral drug therapy 04/27/2018   Enuresis 04/27/2018   Centrilobular emphysema ([place]) 04/27/2018   Seasonal allergic rhinitis 04/27/2018   Tobacco dependence 08/24/2017   Weight loss, unintentional 08/24/2017   Insomnia 06/27/2017   Prediabetes 01/24/2017   Asthma 06/10/2016   Essential hypertension 06/10/2016   Current every day smoker 06/10/2016     Current Outpatient Medications on File Prior to Visit  Medication Sig Dispense Refill   ipratropium-albuterol (DUONEB) 0.5-2.5 (3) MG/3ML SOLN Take 3 mLs by nebulization every 6 (six) hours as needed (Shortness of breath or wheezing). 360 mL 2   pantoprazole (PROTONIX) 40 MG tablet Take 1 tablet (40 mg total) by mouth daily at 6 (six) AM. (Patient not taking: Reported on 01/04/2022) 30 tablet 2   No current facility-administered medications on file prior to visit.    Allergies  Allergen Reactions   Tobacco Shortness Of Breath and Other (See Comments)    CIGARETTE SMOKE TRIGGERS THE PATIENT'S RESPIRATORY ISSUES!!    Social History   Socioeconomic History   Marital status: Single    Spouse name: Not on file   Number of children: Not on file   Years of education: Not on file  Highest education level: Not on file  Occupational History   Not on file  Tobacco Use   Smoking status: Former    Packs/day: 0.50    Years: 45.00    Total pack years: 22.50    Types: Cigarettes    Quit date: 07/17/2021    Years since quitting: 0.4   Smokeless tobacco: Never  Substance and Sexual Activity   Alcohol use: Yes    Alcohol/week: 1.0 standard drink of alcohol    Types: 1 Cans of beer per week    Comment: Tries to stay alcohol free   Drug use: Not  Currently    Types: "Crack" cocaine   Sexual activity: Yes    Partners: Female  Other Topics Concern   Not on file  Social History Narrative   Not on file   Social Determinants of Health   Financial Resource Strain: Not on file  Food Insecurity: No Food Insecurity (11/30/2021)   Hunger Vital Sign    Worried About Running Out of Food in the Last Year: Never true    Ran Out of Food in the Last Year: Never true  Transportation Needs: No Transportation Needs (11/30/2021)   PRAPARE - Hydrologist (Medical): No    Lack of Transportation (Non-Medical): No  Physical Activity: Not on file  Stress: Not on file  Social Connections: Not on file  Intimate Partner Violence: Not on file    Family History  Problem Relation Age of Onset   Hypertension Mother    Breast cancer Sister    Hypertension Sister    Colon cancer Brother    Colon polyps Neg Hx    Esophageal cancer Neg Hx    Rectal cancer Neg Hx    Stomach cancer Neg Hx     Past Surgical History:  Procedure Laterality Date   INCISE AND DRAIN ABCESS     dog bite    ROS: Review of Systems Negative except as stated above  PHYSICAL EXAM: BP 128/82   Pulse 81   Temp 97.6 F (36.4 C) (Oral)   Ht '5\' 7"'$  (1.702 m)   Wt 120 lb 9.6 oz (54.7 kg)   SpO2 97%   BMI 18.89 kg/m   Wt Readings from Last 3 Encounters:  01/04/22 120 lb 9.6 oz (54.7 kg)  10/19/21 127 lb (57.6 kg)  10/07/21 124 lb 12.8 oz (56.6 kg)    Physical Exam  General appearance - alert, older African-American male and in no distress.  He appears underweight for height. Mental status - normal mood, behavior, speech, dress, motor activity, and thought processes Neck - supple, no significant adenopathy Chest -breath sounds decreased bilaterally with scattered wheezes. Heart - normal rate, regular rhythm, normal S1, S2, no murmurs, rubs, clicks or gallops Extremities -no lower extremity edema.  Positive clubbing.      Latest Ref  Rng & Units 09/02/2021    4:02 AM 09/02/2021    3:47 AM 08/13/2021    4:00 AM  CMP  Glucose 70 - 99 mg/dL  96  154   BUN 8 - 23 mg/dL  14  13   Creatinine 0.61 - 1.24 mg/dL  1.10  1.04   Sodium 135 - 145 mmol/L 140  141  136   Potassium 3.5 - 5.1 mmol/L 3.9  3.9  4.1   Chloride 98 - 111 mmol/L  107  107   CO2 22 - 32 mmol/L  21  18   Calcium 8.9 -  10.3 mg/dL  9.2  9.6    Lipid Panel     Component Value Date/Time   CHOL 171 09/06/2019 1053   TRIG 57 09/06/2019 1053   HDL 70 09/06/2019 1053   CHOLHDL 2.4 09/06/2019 1053   LDLCALC 90 09/06/2019 1053    CBC    Component Value Date/Time   WBC 5.4 10/19/2021 0906   RBC 3.88 (L) 10/19/2021 0906   HGB 12.7 (L) 10/19/2021 0906   HGB 17.2 09/06/2019 1053   HCT 38.0 (L) 10/19/2021 0906   HCT 49.9 09/06/2019 1053   PLT 277.0 10/19/2021 0906   PLT 274 09/06/2019 1053   MCV 97.9 10/19/2021 0906   MCV 98 (H) 09/06/2019 1053   MCH 34.2 (H) 09/02/2021 0347   MCHC 33.5 10/19/2021 0906   RDW 14.5 10/19/2021 0906   RDW 11.5 (L) 09/06/2019 1053   LYMPHSABS 2.2 10/19/2021 0906   LYMPHSABS 2.5 06/13/2016 1110   MONOABS 0.4 10/19/2021 0906   EOSABS 0.4 10/19/2021 0906   EOSABS 0.1 06/13/2016 1110   BASOSABS 0.1 10/19/2021 0906   BASOSABS 0.0 06/13/2016 1110    ASSESSMENT AND PLAN: 1. Centrilobular emphysema ([place]) Patient experiencing mild flareup due to being out of his inhalers.  He still had refills on the Trelegy inhaler prescription that was sent over by his pulmonologist.  Strongly advised patient to return to the pharmacy several days before he runs out of the inhaler. Short course of prednisone given. - albuterol (PROAIR HFA) 108 (90 Base) MCG/ACT inhaler; Inhale 2 puffs into the lungs once every 4 (four) hours as needed for wheezing or shortness of breath.  Dispense: 6.7 g; Refill: 6 - Fluticasone-Umeclidin-Vilant (TRELEGY ELLIPTA) 200-62.5-25 MCG/ACT AEPB; Inhale 1 puff into the lungs daily.  Dispense: 60 each; Refill: 6 -  predniSONE (DELTASONE) 20 MG tablet; Take 1 tablet (20 mg total) by mouth daily with breakfast.  Dispense: 5 tablet; Refill: 0  2. Essential hypertension Close to goal.  Continue amlodipine. - amLODipine (NORVASC) 5 MG tablet; Take 1 tablet (5 mg total) by mouth daily.  Dispense: 90 tablet; Refill: 3  3. Poor appetite Advised to eat 5 small meals a day instead of trying to eat 3 big meals a day. Advised to supplement meals with boost or Ensure shakes. We will put him on low-dose of Remeron which will help not only increase his appetite but also help with sleep. - mirtazapine (REMERON SOL-TAB) 15 MG disintegrating tablet; Take 1 tablet (15 mg total) by mouth at bedtime.  Dispense: 30 tablet; Refill: 3  4. Weight loss, unintentional See #3 above. - TSH+T4F+T3Free - HIV antibody (with reflex)  5. Other insomnia Good sleep hygiene discussed and encouraged.  Recommend that he try getting in bed later than 8 PM.  Once in bed he should turn off his TV.  If he does not fall asleep within 45 minutes of getting in bed, he should get up and do something until he feels sleepy then try getting back in bed. - mirtazapine (REMERON SOL-TAB) 15 MG disintegrating tablet; Take 1 tablet (15 mg total) by mouth at bedtime.  Dispense: 30 tablet; Refill: 3  6. Normocytic anemia - Iron, TIBC and Ferritin Panel  7. Pure hypercholesterolemia - atorvastatin (LIPITOR) 10 MG tablet; Take 1 tablet (10 mg total) by mouth daily.  Dispense: 90 tablet; Refill: 3  8. Screening for colon cancer -Discussed the importance of following up for repeat colonoscopy given his history of colon polyps. - Ambulatory referral to Gastroenterology  9. Need for zoster vaccination Prescription given for him to take downstairs to the pharmacy to get his second shingles vaccine. - Zoster Vaccine Adjuvanted [facility]) injection; Inject 0.5 mLs into the muscle once for 1 dose.  Dispense: 0.5 mL; Refill: 0  10. Need for immunization  against influenza - Flu Vaccine QUAD High Dose(Fluad)     Patient was given the opportunity to ask questions.  Patient verbalized understanding of the plan and was able to repeat key elements of the plan.   This documentation was completed using Radio producer.  Any transcriptional errors are unintentional.  Orders Placed This Encounter  Procedures   Flu Vaccine QUAD High Dose(Fluad)   TSH+T4F+T3Free   Iron, TIBC and Ferritin Panel   HIV antibody (with reflex)   Ambulatory referral to Gastroenterology     Requested Prescriptions   Signed Prescriptions Disp Refills   albuterol (PROAIR HFA) 108 (90 Base) MCG/ACT inhaler 6.7 g 6    Sig: Inhale 2 puffs into the lungs once every 4 (four) hours as needed for wheezing or shortness of breath.   amLODipine (NORVASC) 5 MG tablet 90 tablet 3    Sig: Take 1 tablet (5 mg total) by mouth daily.   Fluticasone-Umeclidin-Vilant (TRELEGY ELLIPTA) 200-62.5-25 MCG/ACT AEPB 60 each 6    Sig: Inhale 1 puff into the lungs daily.   atorvastatin (LIPITOR) 10 MG tablet 90 tablet 3    Sig: Take 1 tablet (10 mg total) by mouth daily.   predniSONE (DELTASONE) 20 MG tablet 5 tablet 0    Sig: Take 1 tablet (20 mg total) by mouth daily with breakfast.   mirtazapine (REMERON SOL-TAB) 15 MG disintegrating tablet 30 tablet 3    Sig: Take 1 tablet (15 mg total) by mouth at bedtime.   Zoster Vaccine Adjuvanted [facility]) injection 0.5 mL 0    Sig: Inject 0.5 mLs into the muscle once for 1 dose.    Return in about 4 months (around 05/07/2022).  Karle Plumber, MD, FACP

## 2022-01-05 ENCOUNTER — Other Ambulatory Visit: Payer: Self-pay

## 2022-01-07 LAB — IRON,TIBC AND FERRITIN PANEL
Ferritin: 798 ng/mL — ABNORMAL HIGH (ref 30–400)
Iron Saturation: 35 % (ref 15–55)
Iron: 88 ug/dL (ref 38–169)
Total Iron Binding Capacity: 255 ug/dL (ref 250–450)
UIBC: 167 ug/dL (ref 111–343)

## 2022-01-07 LAB — TSH+T4F+T3FREE
Free T4: 1.4 ng/dL (ref 0.82–1.77)
T3, Free: 3.3 pg/mL (ref 2.0–4.4)
TSH: 1.08 u[IU]/mL (ref 0.450–4.500)

## 2022-01-07 LAB — HIV ANTIBODY (ROUTINE TESTING W REFLEX): HIV Screen 4th Generation wRfx: NONREACTIVE

## 2022-01-12 ENCOUNTER — Other Ambulatory Visit: Payer: Self-pay

## 2022-01-13 ENCOUNTER — Other Ambulatory Visit: Payer: Self-pay

## 2022-01-21 DIAGNOSIS — H2513 Age-related nuclear cataract, bilateral: Secondary | ICD-10-CM | POA: Diagnosis not present

## 2022-01-24 ENCOUNTER — Other Ambulatory Visit: Payer: Self-pay

## 2022-01-31 ENCOUNTER — Other Ambulatory Visit: Payer: Self-pay

## 2022-02-11 ENCOUNTER — Other Ambulatory Visit: Payer: Self-pay

## 2022-02-11 ENCOUNTER — Other Ambulatory Visit: Payer: Self-pay | Admitting: Internal Medicine

## 2022-02-14 ENCOUNTER — Other Ambulatory Visit: Payer: Self-pay

## 2022-02-14 ENCOUNTER — Other Ambulatory Visit (HOSPITAL_BASED_OUTPATIENT_CLINIC_OR_DEPARTMENT_OTHER): Payer: Self-pay

## 2022-02-14 MED ORDER — PANTOPRAZOLE SODIUM 40 MG PO TBEC
40.0000 mg | DELAYED_RELEASE_TABLET | Freq: Every day | ORAL | 0 refills | Status: DC
  Filled 2022-02-14 – 2022-03-15 (×2): qty 90, 90d supply, fill #0

## 2022-02-14 NOTE — Telephone Encounter (Signed)
Requested Prescriptions  Pending Prescriptions Disp Refills   pantoprazole (PROTONIX) 40 MG tablet 90 tablet 0    Sig: Take 1 tablet (40 mg total) by mouth daily at 6 (six) AM.     Gastroenterology: Proton Pump Inhibitors Passed - 02/11/2022 11:13 AM      Passed - Valid encounter within last 12 months    Recent Outpatient Visits           1 month ago Centrilobular emphysema ([place])   [place], MD   5 months ago Hospital discharge follow-up   [place], MD   5 months ago Encounter for [employer] annual wellness exam   [place], Stephen L, RPH-CPP   6 months ago Encounter for medication review and counseling   [place], Stephen L, RPH-CPP   6 months ago Centrilobular emphysema [facility])   [place], MD       Future Appointments             In 2 months Wynetta Emery Dalbert Batman, MD [place]

## 2022-02-21 ENCOUNTER — Other Ambulatory Visit: Payer: Self-pay

## 2022-03-03 ENCOUNTER — Encounter: Payer: Self-pay | Admitting: Gastroenterology

## 2022-03-08 ENCOUNTER — Other Ambulatory Visit: Payer: Self-pay

## 2022-03-15 ENCOUNTER — Other Ambulatory Visit: Payer: Self-pay

## 2022-03-16 ENCOUNTER — Other Ambulatory Visit: Payer: Self-pay

## 2022-03-25 ENCOUNTER — Other Ambulatory Visit: Payer: Self-pay

## 2022-04-04 ENCOUNTER — Ambulatory Visit: Payer: Medicare Other

## 2022-04-04 ENCOUNTER — Telehealth: Payer: Self-pay

## 2022-04-04 NOTE — Telephone Encounter (Signed)
Called at 11:00, 11:09 & 11:15.  No answer and no voice mail.  Will wait to see if patient calls back by 5, pre visit and colonoscopy will be cancelled at 5, and letter will be sent.

## 2022-04-04 NOTE — Progress Notes (Signed)
No show, appointments cancelled and letter sent to patient

## 2022-04-04 NOTE — Telephone Encounter (Signed)
Patient did not call back.  Pre visit appointment and Colonoscopy are cancelled and NO SHOW letter sent.

## 2022-04-05 ENCOUNTER — Other Ambulatory Visit: Payer: Self-pay

## 2022-04-18 ENCOUNTER — Other Ambulatory Visit: Payer: Self-pay

## 2022-04-19 ENCOUNTER — Other Ambulatory Visit: Payer: Self-pay

## 2022-04-25 ENCOUNTER — Other Ambulatory Visit: Payer: Self-pay

## 2022-04-28 ENCOUNTER — Other Ambulatory Visit: Payer: Self-pay

## 2022-04-29 ENCOUNTER — Encounter: Payer: Medicare Other | Admitting: Gastroenterology

## 2022-05-03 ENCOUNTER — Other Ambulatory Visit: Payer: Self-pay

## 2022-05-04 ENCOUNTER — Other Ambulatory Visit: Payer: Self-pay

## 2022-05-05 ENCOUNTER — Other Ambulatory Visit: Payer: Self-pay

## 2022-05-06 ENCOUNTER — Other Ambulatory Visit: Payer: Self-pay

## 2022-05-09 ENCOUNTER — Other Ambulatory Visit: Payer: Self-pay

## 2022-05-09 ENCOUNTER — Ambulatory Visit: Payer: Medicare Other | Attending: Internal Medicine | Admitting: Internal Medicine

## 2022-05-09 ENCOUNTER — Encounter: Payer: Self-pay | Admitting: Internal Medicine

## 2022-05-09 VITALS — BP 111/74 | HR 69 | Ht 69.5 in | Wt 127.6 lb

## 2022-05-09 DIAGNOSIS — R630 Anorexia: Secondary | ICD-10-CM | POA: Diagnosis not present

## 2022-05-09 DIAGNOSIS — F1921 Other psychoactive substance dependence, in remission: Secondary | ICD-10-CM

## 2022-05-09 DIAGNOSIS — Z1211 Encounter for screening for malignant neoplasm of colon: Secondary | ICD-10-CM | POA: Diagnosis not present

## 2022-05-09 DIAGNOSIS — E441 Mild protein-calorie malnutrition: Secondary | ICD-10-CM | POA: Diagnosis not present

## 2022-05-09 DIAGNOSIS — F1910 Other psychoactive substance abuse, uncomplicated: Secondary | ICD-10-CM | POA: Insufficient documentation

## 2022-05-09 DIAGNOSIS — L602 Onychogryphosis: Secondary | ICD-10-CM

## 2022-05-09 DIAGNOSIS — G4709 Other insomnia: Secondary | ICD-10-CM | POA: Diagnosis not present

## 2022-05-09 DIAGNOSIS — I10 Essential (primary) hypertension: Secondary | ICD-10-CM

## 2022-05-09 DIAGNOSIS — Z23 Encounter for immunization: Secondary | ICD-10-CM

## 2022-05-09 DIAGNOSIS — J432 Centrilobular emphysema: Secondary | ICD-10-CM

## 2022-05-09 DIAGNOSIS — G629 Polyneuropathy, unspecified: Secondary | ICD-10-CM

## 2022-05-09 MED ORDER — MIRTAZAPINE 15 MG PO TABS
7.5000 mg | ORAL_TABLET | Freq: Every day | ORAL | 1 refills | Status: DC
  Filled 2022-05-09: qty 45, 90d supply, fill #0
  Filled 2022-06-27: qty 15, 30d supply, fill #1

## 2022-05-09 MED ORDER — ZOSTER VAC RECOMB ADJUVANTED 50 MCG/0.5ML IM SUSR: 0.5000 mL | Freq: Once | INTRAMUSCULAR | 0 refills | Status: AC

## 2022-05-09 NOTE — Progress Notes (Signed)
Patient ID: Jason Wong, male    DOB: 19-Jun-1955  MRN: [id number]  CC: Leg Problem and trouble sleeping    Subjective: Jason Wong is a 67 y.o. male who presents for chronic ds management His concerns today include:  Pt with hx of HTN, tob dep, seasonal allergies, asthma, centrilobular emphysema, insomnia, coronary atherosclerosis, pre-DM, polysub abuse (ETOH, cocaine, drug over dose accidentally 08/2020)  and hep C treated.    Did not bring meds with him today.  Tells me he takes 3 medications.    COPD:  should be on Trelegy.  Ran out yesterday.  Will be picking up refill today from the pharmacy.  Works well Remains free cigarettes. No street drug use in over 5 mths.  Drinks a beer every once and a while  HTN: Taking and tolerating Norvasc 5 mg. Took it already for today. Limit salt in the foods.   Insomnia: Started on low-dose of Remeron on last visit for chronic insomnia and also to help with poor appetite/weight loss.  He does not recall.  Looks like it was dispense once on his last visit from our pharmacy.  He did not know he had refills.  Feels appetite is down again.  Wgh up 7  lbs since last visit.  Wearing heavy tennis today though.   HL: Reports compliance with taking atorvastatin 10 mg daily,  Reports LT leg not working as well as the RT.  No pain.  RT knee does not bend well and feels the RT leg it drags some.  Present for 1 yr but always forget to mention it.  No falls.  Ambulates unassisted  HM: Was called by [facility] gastroenterology back in November to be scheduled for colonoscopy but they have not been able to reach him.  Patient with history of colon polyps.  Patient Active Problem List   Diagnosis Date Noted   On home oxygen therapy 08/31/2021   Malnutrition of moderate degree 06/16/2021   Acute exacerbation of chronic obstructive pulmonary disease (COPD) ([place]) 06/14/2021   ETOH abuse 05/01/2021   Acute respiratory failure with hypoxia due to COPD exacerbation  04/30/2021   HLD (hyperlipidemia) 04/30/2021   COPD (chronic obstructive pulmonary disease) with emphysema ([place]) 02/19/2021   COPD exacerbation ([place]) 02/18/2021   Memory deficit 04/28/2020   Hepatitis C virus infection cured after antiviral drug therapy 04/27/2018   Enuresis 04/27/2018   Centrilobular emphysema ([place]) 04/27/2018   Seasonal allergic rhinitis 04/27/2018   Tobacco dependence 08/24/2017   Weight loss, unintentional 08/24/2017   Insomnia 06/27/2017   Prediabetes 01/24/2017   Asthma 06/10/2016   Essential hypertension 06/10/2016   Current every day smoker 06/10/2016     Current Outpatient Medications on File Prior to Visit  Medication Sig Dispense Refill   albuterol (PROAIR HFA) 108 (90 Base) MCG/ACT inhaler Inhale 2 puffs into the lungs once every 4 (four) hours as needed for wheezing or shortness of breath. 6.7 g 6   amLODipine (NORVASC) 5 MG tablet Take 1 tablet (5 mg total) by mouth daily. 90 tablet 3   atorvastatin (LIPITOR) 10 MG tablet Take 1 tablet (10 mg total) by mouth daily. 90 tablet 3   Fluticasone-Umeclidin-Vilant (TRELEGY ELLIPTA) 200-62.5-25 MCG/ACT AEPB Inhale 1 puff into the lungs daily. 60 each 6   mirtazapine (REMERON SOL-TAB) 15 MG disintegrating tablet Take 1 tablet (15 mg total) by mouth at bedtime. 30 tablet 3   pantoprazole (PROTONIX) 40 MG tablet Take 1 tablet (40 mg total) by mouth daily  at 6 (six) AM. 90 tablet 0   predniSONE (DELTASONE) 20 MG tablet Take 1 tablet (20 mg total) by mouth daily with breakfast. 5 tablet 0   ipratropium-albuterol (DUONEB) 0.5-2.5 (3) MG/3ML SOLN Take 3 mLs by nebulization every 6 (six) hours as needed (Shortness of breath or wheezing). 360 mL 2   No current facility-administered medications on file prior to visit.    Allergies  Allergen Reactions   Tobacco Shortness Of Breath and Other (See Comments)    CIGARETTE SMOKE TRIGGERS THE PATIENT'S RESPIRATORY ISSUES!!    Social History   Socioeconomic History    Marital status: Single    Spouse name: Not on file   Number of children: Not on file   Years of education: Not on file   Highest education level: Not on file  Occupational History   Not on file  Tobacco Use   Smoking status: Former    Packs/day: 0.50    Years: 45.00    Total pack years: 22.50    Types: Cigarettes    Quit date: 07/17/2021    Years since quitting: 0.8   Smokeless tobacco: Never  Substance and Sexual Activity   Alcohol use: Yes    Alcohol/week: 1.0 standard drink of alcohol    Types: 1 Cans of beer per week    Comment: Tries to stay alcohol free   Drug use: Not Currently    Types: "Crack" cocaine   Sexual activity: Yes    Partners: Female  Other Topics Concern   Not on file  Social History Narrative   Not on file   Social Determinants of Health   Financial Resource Strain: Not on file  Food Insecurity: No Food Insecurity (11/30/2021)   Hunger Vital Sign    Worried About Running Out of Food in the Last Year: Never true    Ran Out of Food in the Last Year: Never true  Transportation Needs: No Transportation Needs (11/30/2021)   PRAPARE - Hydrologist (Medical): No    Lack of Transportation (Non-Medical): No  Physical Activity: Not on file  Stress: Not on file  Social Connections: Not on file  Intimate Partner Violence: Not on file    Family History  Problem Relation Age of Onset   Hypertension Mother    Breast cancer Sister    Hypertension Sister    Colon cancer Brother    Colon polyps Neg Hx    Esophageal cancer Neg Hx    Rectal cancer Neg Hx    Stomach cancer Neg Hx     Past Surgical History:  Procedure Laterality Date   INCISE AND DRAIN ABCESS     dog bite    ROS: Review of Systems Negative except as stated above  PHYSICAL EXAM: BP 111/74 (BP Location: Left Arm, Patient Position: Sitting, Cuff Size: Normal)   Pulse 69   Ht 5' 9.5" (1.765 m)   Wt 127 lb 9.6 oz (57.9 kg)   SpO2 99%   BMI 18.57 kg/m   Wt  Readings from Last 3 Encounters:  05/09/22 127 lb 9.6 oz (57.9 kg)  01/04/22 120 lb 9.6 oz (54.7 kg)  10/19/21 127 lb (57.6 kg)    Physical Exam   General appearance -patient in NAD.  He appears underweight with slight temporal wasting. Mouth - mucous membranes moist, pharynx normal without lesions Neck - supple, no significant adenopathy Chest - clear to auscultation, no wheezes, rales or rhonchi, symmetric air entry Heart -  normal rate, regular rhythm, normal S1, S2, no murmurs, rubs, clicks or gallops Extremities - peripheral pulses normal, no pedal edema, no clubbing or cyanosis Skin -toenails are thick and overgrown.  Skin on both feet are very dry and flaky. Neuro: Patient with small muscles in the lower extremity.  Power in the lower extremities 4/5 bilaterally proximally and distally.  Power dorsi flexion and plantarflexion of both feet 4/5 bilaterally.  Decreased gross sensation in the lower extremities from the knees down including the feet.  Low foot to floor clearance of both feet with ambulation right greater than left.  Does not appear to have foot drop.    Latest Ref Rng & Units 09/02/2021    4:02 AM 09/02/2021    3:47 AM 08/13/2021    4:00 AM  CMP  Glucose 70 - 99 mg/dL  96  154   BUN 8 - 23 mg/dL  14  13   Creatinine 0.61 - 1.24 mg/dL  1.10  1.04   Sodium 135 - 145 mmol/L 140  141  136   Potassium 3.5 - 5.1 mmol/L 3.9  3.9  4.1   Chloride 98 - 111 mmol/L  107  107   CO2 22 - 32 mmol/L  21  18   Calcium 8.9 - 10.3 mg/dL  9.2  9.6    Lipid Panel     Component Value Date/Time   CHOL 171 09/06/2019 1053   TRIG 57 09/06/2019 1053   HDL 70 09/06/2019 1053   CHOLHDL 2.4 09/06/2019 1053   LDLCALC 90 09/06/2019 1053    CBC    Component Value Date/Time   WBC 5.4 10/19/2021 0906   RBC 3.88 (L) 10/19/2021 0906   HGB 12.7 (L) 10/19/2021 0906   HGB 17.2 09/06/2019 1053   HCT 38.0 (L) 10/19/2021 0906   HCT 49.9 09/06/2019 1053   PLT 277.0 10/19/2021 0906   PLT 274  09/06/2019 1053   MCV 97.9 10/19/2021 0906   MCV 98 (H) 09/06/2019 1053   MCH 34.2 (H) 09/02/2021 0347   MCHC 33.5 10/19/2021 0906   RDW 14.5 10/19/2021 0906   RDW 11.5 (L) 09/06/2019 1053   LYMPHSABS 2.2 10/19/2021 0906   LYMPHSABS 2.5 06/13/2016 1110   MONOABS 0.4 10/19/2021 0906   EOSABS 0.4 10/19/2021 0906   EOSABS 0.1 06/13/2016 1110   BASOSABS 0.1 10/19/2021 0906   BASOSABS 0.0 06/13/2016 1110    ASSESSMENT AND PLAN: 1. Essential hypertension At goal.  Continue Norvasc 5 mg daily.  2. Peripheral polyneuropathy Patient has symptoms and physical exam findings to suggest peripheral neuropathy bilateral lower extremities.  We will check some baseline blood test.  Discussed referral to physical therapy and neurology.  Patient agreeable. - Folate - Vitamin B12 - Comprehensive metabolic panel - Hemoglobin A1c - Ambulatory referral to Physical Therapy - Ambulatory referral to Neurology  3. Poor appetite Will send refill on Remeron to the pharmacy.  I have changed it from disintegrating tablet to the regular Remeron.  He is on this to help with insomnia as well. - mirtazapine (REMERON) 15 MG tablet; Take 0.5 tablets (7.5 mg total) by mouth at bedtime.  Dispense: 45 tablet; Refill: 1  4. Other insomnia See #3 above - mirtazapine (REMERON) 15 MG tablet; Take 0.5 tablets (7.5 mg total) by mouth at bedtime.  Dispense: 45 tablet; Refill: 1  5. Malnutrition of mild degree ([place]) See #3 above  6. Centrilobular emphysema ([place]) Stable on Trelegy.  He will pick up refill today.  7. Polysubstance dependence, non-opioid, in remission ([place]) Commended him on this.  Encouraged him to remain free of street drugs  8. Overgrown toenails - Ambulatory referral to Podiatry  9. Need for shingles vaccine Prescription given for him to take to our pharmacy downstairs. - Zoster Vaccine Adjuvanted [facility]) injection; Inject 0.5 mLs into the muscle once for 1 dose.  Dispense: 0.5 mL; Refill:  0  10. Screening for colon cancer I wrote down the phone number for [facility] gastroenterology on one of my business card and gave it to him.  Request that he calls them to schedule his colonoscopy.  He promises to do so.     Patient was given the opportunity to ask questions.  Patient verbalized understanding of the plan and was able to repeat key elements of the plan.   This documentation was completed using Radio producer.  Any transcriptional errors are unintentional.  No orders of the defined types were placed in this encounter.    Requested Prescriptions    No prescriptions requested or ordered in this encounter    No follow-ups on file.  Karle Plumber, MD, FACP

## 2022-05-09 NOTE — Patient Instructions (Signed)
Please call [facility] gastroenterology to schedule your colonoscopy.  I have provided you with their phone number.  Please stop downstairs and pharmacy to get your second shingles vaccine and to pick up the prescription for the Remeron.  This is the medication to help with sleep and to help with your appetite.

## 2022-05-10 LAB — COMPREHENSIVE METABOLIC PANEL
ALT: 29 [IU]/L (ref 0–44)
AST: 46 [IU]/L — ABNORMAL HIGH (ref 0–40)
Albumin/Globulin Ratio: 1.7 (ref 1.2–2.2)
Albumin: 5 g/dL — ABNORMAL HIGH (ref 3.9–4.9)
Alkaline Phosphatase: 101 [IU]/L (ref 44–121)
BUN/Creatinine Ratio: 9 — ABNORMAL LOW (ref 10–24)
BUN: 9 mg/dL (ref 8–27)
Bilirubin Total: 0.7 mg/dL (ref 0.0–1.2)
CO2: 20 mmol/L (ref 20–29)
Calcium: 9.9 mg/dL (ref 8.6–10.2)
Chloride: 105 mmol/L (ref 96–106)
Creatinine, Ser: 1.05 mg/dL (ref 0.76–1.27)
Globulin, Total: 3 g/dL (ref 1.5–4.5)
Glucose: 105 mg/dL — ABNORMAL HIGH (ref 70–99)
Potassium: 3.9 mmol/L (ref 3.5–5.2)
Sodium: 144 mmol/L (ref 134–144)
Total Protein: 8 g/dL (ref 6.0–8.5)
eGFR: 78 mL/min/{1.73_m2} (ref >59)

## 2022-05-10 LAB — HEMOGLOBIN A1C
Est. average glucose Bld gHb Est-mCnc: 111 mg/dL
Hgb A1c MFr Bld: 5.5 % (ref 4.8–5.6)

## 2022-05-10 LAB — FOLATE: Folate: >20 ng/mL (ref >3.0)

## 2022-05-10 LAB — VITAMIN B12: Vitamin B-12: 708 pg/mL (ref 232–1245)

## 2022-05-10 NOTE — Progress Notes (Signed)
Let patient know he is still in the range for prediabetes.  Healthy eating habits will prevent progression to full diabetes.  Kidney function is okay.  He has mild elevation in one of his liver function tests which we will observe for now.  Atorvastatin can cause mild elevation in the liver function test.  Avoid excessive alcohol use while taking atorvastatin.  Vitamin [id number] and folic acid levels are normal.

## 2022-05-11 ENCOUNTER — Other Ambulatory Visit: Payer: Self-pay

## 2022-05-16 ENCOUNTER — Other Ambulatory Visit: Payer: Self-pay

## 2022-05-17 ENCOUNTER — Other Ambulatory Visit: Payer: Self-pay

## 2022-05-23 ENCOUNTER — Other Ambulatory Visit: Payer: Self-pay

## 2022-05-24 ENCOUNTER — Other Ambulatory Visit: Payer: Self-pay

## 2022-05-26 ENCOUNTER — Other Ambulatory Visit: Payer: Self-pay

## 2022-05-27 ENCOUNTER — Other Ambulatory Visit: Payer: Self-pay

## 2022-06-06 ENCOUNTER — Other Ambulatory Visit: Payer: Self-pay

## 2022-06-06 ENCOUNTER — Other Ambulatory Visit: Payer: Self-pay | Admitting: Internal Medicine

## 2022-06-06 DIAGNOSIS — J432 Centrilobular emphysema: Secondary | ICD-10-CM

## 2022-06-06 MED ORDER — ALBUTEROL SULFATE HFA 108 (90 BASE) MCG/ACT IN AERS
2.0000 | INHALATION_SPRAY | RESPIRATORY_TRACT | 3 refills | Status: DC | PRN
  Filled 2022-06-06: qty 6.7, 17d supply, fill #0
  Filled 2022-06-27: qty 6.7, 16d supply, fill #0
  Filled 2022-06-27: qty 6.7, 17d supply, fill #0
  Filled 2022-07-21: qty 6.7, 17d supply, fill #1
  Filled 2022-08-03: qty 6.7, 17d supply, fill #2
  Filled 2022-08-16: qty 6.7, 17d supply, fill #3

## 2022-06-19 ENCOUNTER — Emergency Department (HOSPITAL_COMMUNITY): Payer: Medicare Other

## 2022-06-19 ENCOUNTER — Inpatient Hospital Stay (HOSPITAL_COMMUNITY)
Admission: EM | Admit: 2022-06-19 | Discharge: 2022-06-23 | DRG: 193 | Disposition: A | Discharge status: Home Health | Payer: Medicare Other | Attending: Internal Medicine | Admitting: Internal Medicine

## 2022-06-19 ENCOUNTER — Encounter (HOSPITAL_COMMUNITY): Payer: Self-pay

## 2022-06-19 DIAGNOSIS — R5381 Other malaise: Secondary | ICD-10-CM | POA: Diagnosis present

## 2022-06-19 DIAGNOSIS — Z7951 Long term (current) use of inhaled steroids: Secondary | ICD-10-CM

## 2022-06-19 DIAGNOSIS — R062 Wheezing: Secondary | ICD-10-CM | POA: Diagnosis not present

## 2022-06-19 DIAGNOSIS — I10 Essential (primary) hypertension: Secondary | ICD-10-CM | POA: Diagnosis present

## 2022-06-19 DIAGNOSIS — Z1152 Encounter for screening for COVID-19: Secondary | ICD-10-CM | POA: Diagnosis not present

## 2022-06-19 DIAGNOSIS — B1920 Unspecified viral hepatitis C without hepatic coma: Secondary | ICD-10-CM | POA: Diagnosis present

## 2022-06-19 DIAGNOSIS — J439 Emphysema, unspecified: Secondary | ICD-10-CM | POA: Diagnosis not present

## 2022-06-19 DIAGNOSIS — K219 Gastro-esophageal reflux disease without esophagitis: Secondary | ICD-10-CM | POA: Diagnosis present

## 2022-06-19 DIAGNOSIS — Z79899 Other long term (current) drug therapy: Secondary | ICD-10-CM

## 2022-06-19 DIAGNOSIS — R000 Tachycardia, unspecified: Secondary | ICD-10-CM | POA: Diagnosis present

## 2022-06-19 DIAGNOSIS — R636 Underweight: Secondary | ICD-10-CM | POA: Diagnosis present

## 2022-06-19 DIAGNOSIS — E876 Hypokalemia: Secondary | ICD-10-CM | POA: Diagnosis present

## 2022-06-19 DIAGNOSIS — J123 Human metapneumovirus pneumonia: Secondary | ICD-10-CM | POA: Diagnosis present

## 2022-06-19 DIAGNOSIS — J9601 Acute respiratory failure with hypoxia: Secondary | ICD-10-CM | POA: Diagnosis not present

## 2022-06-19 DIAGNOSIS — J440 Chronic obstructive pulmonary disease with acute lower respiratory infection: Secondary | ICD-10-CM | POA: Diagnosis present

## 2022-06-19 DIAGNOSIS — R0902 Hypoxemia: Secondary | ICD-10-CM | POA: Diagnosis not present

## 2022-06-19 DIAGNOSIS — R059 Cough, unspecified: Secondary | ICD-10-CM | POA: Diagnosis not present

## 2022-06-19 DIAGNOSIS — J432 Centrilobular emphysema: Secondary | ICD-10-CM | POA: Diagnosis present

## 2022-06-19 DIAGNOSIS — Z9981 Dependence on supplemental oxygen: Secondary | ICD-10-CM

## 2022-06-19 DIAGNOSIS — J441 Chronic obstructive pulmonary disease with (acute) exacerbation: Secondary | ICD-10-CM | POA: Diagnosis present

## 2022-06-19 DIAGNOSIS — B9781 Human metapneumovirus as the cause of diseases classified elsewhere: Secondary | ICD-10-CM | POA: Diagnosis present

## 2022-06-19 DIAGNOSIS — R4702 Dysphasia: Secondary | ICD-10-CM | POA: Diagnosis not present

## 2022-06-19 DIAGNOSIS — Z803 Family history of malignant neoplasm of breast: Secondary | ICD-10-CM | POA: Diagnosis not present

## 2022-06-19 DIAGNOSIS — Z743 Need for continuous supervision: Secondary | ICD-10-CM | POA: Diagnosis not present

## 2022-06-19 DIAGNOSIS — Z8249 Family history of ischemic heart disease and other diseases of the circulatory system: Secondary | ICD-10-CM | POA: Diagnosis not present

## 2022-06-19 DIAGNOSIS — E785 Hyperlipidemia, unspecified: Secondary | ICD-10-CM | POA: Diagnosis present

## 2022-06-19 DIAGNOSIS — J189 Pneumonia, unspecified organism: Principal | ICD-10-CM | POA: Diagnosis present

## 2022-06-19 DIAGNOSIS — F17210 Nicotine dependence, cigarettes, uncomplicated: Secondary | ICD-10-CM | POA: Diagnosis present

## 2022-06-19 DIAGNOSIS — Z800 Family history of malignant neoplasm of digestive organs: Secondary | ICD-10-CM

## 2022-06-19 DIAGNOSIS — F1410 Cocaine abuse, uncomplicated: Secondary | ICD-10-CM | POA: Diagnosis present

## 2022-06-19 DIAGNOSIS — F1010 Alcohol abuse, uncomplicated: Secondary | ICD-10-CM | POA: Diagnosis present

## 2022-06-19 DIAGNOSIS — Z681 Body mass index (BMI) 19 or less, adult: Secondary | ICD-10-CM | POA: Diagnosis not present

## 2022-06-19 DIAGNOSIS — R0689 Other abnormalities of breathing: Secondary | ICD-10-CM | POA: Diagnosis not present

## 2022-06-19 DIAGNOSIS — I499 Cardiac arrhythmia, unspecified: Secondary | ICD-10-CM | POA: Diagnosis not present

## 2022-06-19 DIAGNOSIS — R0602 Shortness of breath: Secondary | ICD-10-CM | POA: Diagnosis not present

## 2022-06-19 DIAGNOSIS — J188 Other pneumonia, unspecified organism: Secondary | ICD-10-CM

## 2022-06-19 LAB — BLOOD GAS, VENOUS
Acid-Base Excess: 0.4 mmol/L (ref 0.0–2.0)
Bicarbonate: 22 mmol/L (ref 20.0–28.0)
O2 Saturation: 90.4 %
Patient temperature: 37
pCO2, Ven: 27 mm[Hg] — ABNORMAL LOW (ref 44–60)
pH, Ven: 7.52 — ABNORMAL HIGH (ref 7.25–7.43)
pO2, Ven: 54 mm[Hg] — ABNORMAL HIGH (ref 32–45)

## 2022-06-19 LAB — D-DIMER, QUANTITATIVE: D-Dimer, Quant: 3.06 ug{FEU}/mL — ABNORMAL HIGH (ref 0.00–0.50)

## 2022-06-19 LAB — COMPREHENSIVE METABOLIC PANEL
ALT: 28 U/L (ref 0–44)
AST: 54 U/L — ABNORMAL HIGH (ref 15–41)
Albumin: 4.3 g/dL (ref 3.5–5.0)
Alkaline Phosphatase: 86 U/L (ref 38–126)
Anion gap: 14 (ref 5–15)
BUN: 11 mg/dL (ref 8–23)
CO2: 19 mmol/L — ABNORMAL LOW (ref 22–32)
Calcium: 8.8 mg/dL — ABNORMAL LOW (ref 8.9–10.3)
Chloride: 106 mmol/L (ref 98–111)
Creatinine, Ser: 1.26 mg/dL — ABNORMAL HIGH (ref 0.61–1.24)
GFR, Estimated: >60 mL/min (ref >60)
Glucose, Bld: 155 mg/dL — ABNORMAL HIGH (ref 70–99)
Potassium: 3.1 mmol/L — ABNORMAL LOW (ref 3.5–5.1)
Sodium: 139 mmol/L (ref 135–145)
Total Bilirubin: 1.1 mg/dL (ref 0.3–1.2)
Total Protein: 8.1 g/dL (ref 6.5–8.1)

## 2022-06-19 LAB — CBC WITH DIFFERENTIAL/PLATELET
Abs Immature Granulocytes: 0.03 10*3/uL (ref 0.00–0.07)
Basophils Absolute: 0 10*3/uL (ref 0.0–0.1)
Basophils Relative: 0 %
Eosinophils Absolute: 0 10*3/uL (ref 0.0–0.5)
Eosinophils Relative: 0 %
HCT: 38.5 % — ABNORMAL LOW (ref 39.0–52.0)
Hemoglobin: 12.9 g/dL — ABNORMAL LOW (ref 13.0–17.0)
Immature Granulocytes: 0 %
Lymphocytes Relative: 12 %
Lymphs Abs: 1.1 10*3/uL (ref 0.7–4.0)
MCH: 32.7 pg (ref 26.0–34.0)
MCHC: 33.5 g/dL (ref 30.0–36.0)
MCV: 97.7 fL (ref 80.0–100.0)
Monocytes Absolute: 0.4 10*3/uL (ref 0.1–1.0)
Monocytes Relative: 5 %
Neutro Abs: 7.3 10*3/uL (ref 1.7–7.7)
Neutrophils Relative %: 83 %
Platelets: 248 10*3/uL (ref 150–400)
RBC: 3.94 MIL/uL — ABNORMAL LOW (ref 4.22–5.81)
RDW: 13.4 % (ref 11.5–15.5)
WBC: 8.8 10*3/uL (ref 4.0–10.5)
nRBC: 0 % (ref 0.0–0.2)

## 2022-06-19 LAB — BRAIN NATRIURETIC PEPTIDE: B Natriuretic Peptide: 116.5 pg/mL — ABNORMAL HIGH (ref 0.0–100.0)

## 2022-06-19 LAB — TROPONIN I (HIGH SENSITIVITY): Troponin I (High Sensitivity): 14 ng/L (ref <18)

## 2022-06-19 MED ORDER — IOHEXOL 350 MG/ML SOLN
75.0000 mL | Freq: Once | INTRAVENOUS | Status: AC | PRN
  Administered 2022-06-19: 75 mL via INTRAVENOUS

## 2022-06-19 MED ORDER — SODIUM CHLORIDE 0.9 % IV SOLN
1.0000 g | Freq: Once | INTRAVENOUS | Status: AC
  Administered 2022-06-19: 1 g via INTRAVENOUS
  Filled 2022-06-19: qty 10

## 2022-06-19 MED ORDER — IPRATROPIUM-ALBUTEROL 0.5-2.5 (3) MG/3ML IN SOLN
6.0000 mL | Freq: Once | RESPIRATORY_TRACT | Status: AC
  Administered 2022-06-19: 6 mL via RESPIRATORY_TRACT
  Filled 2022-06-19: qty 6

## 2022-06-19 MED ORDER — SODIUM CHLORIDE 0.9 % IV SOLN
500.0000 mg | Freq: Once | INTRAVENOUS | Status: AC
  Administered 2022-06-19: 500 mg via INTRAVENOUS
  Filled 2022-06-19: qty 5

## 2022-06-19 NOTE — ED Notes (Signed)
EDP at [facility] 

## 2022-06-19 NOTE — ED Notes (Signed)
RT at [facility], neb initiated

## 2022-06-19 NOTE — ED Triage Notes (Signed)
Patient arrived with complaints of shortness of breath over the last two days. Hx of COPD. Given duoneb and solumedrol prior to transport.

## 2022-06-19 NOTE — ED Notes (Signed)
Xray at BS 

## 2022-06-19 NOTE — ED Notes (Signed)
Lab called to run labs

## 2022-06-19 NOTE — ED Provider Notes (Signed)
[facility] FLOOR PROGRESSIVE CARE AND UROLOGY Provider Note  CSN: [id number] Arrival date & time: 06/19/22 2100  Chief Complaint(s) Shortness of Breath  HPI Jason Wong is a 67 y.o. male with PMH HTN, HLD, COPD on intermittent home oxygen, polysubstance abuse who presents emergency room for evaluation of shortness of breath.  Patient states symptoms progressively worsening the last 2 days and he feels his cigarette use is likely the central cause of his shortness of breath.  States that he is currently not using any additional illicit substances.  Received a DuoNeb and methylprednisolone prior to arrival.  Denies chest pain, abdominal pain, nausea, vomiting or other systemic symptoms.  He states he feels that he "cannot catch his breath".   Past Medical History Past Medical History:  Diagnosis Date   Asthma    COPD (chronic obstructive pulmonary disease)    Hepatitis C antibody positive in blood    HLD (hyperlipidemia)    HTN (hypertension)    On home oxygen therapy    per pt 08-31-2021 uses 02 about every other day   Patient Active Problem List   Diagnosis Date Noted   COPD with acute exacerbation 06/20/2022   Multifocal pneumonia 06/20/2022   Hypokalemia 06/20/2022   Polysubstance abuse 05/09/2022   On home oxygen therapy 08/31/2021   Malnutrition of moderate degree 06/16/2021   Acute exacerbation of chronic obstructive pulmonary disease (COPD) 06/14/2021   ETOH abuse 05/01/2021   Acute hypoxemic respiratory failure 04/30/2021   HLD (hyperlipidemia) 04/30/2021   COPD (chronic obstructive pulmonary disease) with emphysema 02/19/2021   COPD exacerbation 02/18/2021   Memory deficit 04/28/2020   Hepatitis C virus infection cured after antiviral drug therapy 04/27/2018   Enuresis 04/27/2018   Centrilobular emphysema 04/27/2018   Seasonal allergic rhinitis 04/27/2018   Tobacco dependence 08/24/2017   Weight loss, unintentional 08/24/2017   Insomnia 06/27/2017    Prediabetes 01/24/2017   Asthma 06/10/2016   Essential hypertension 06/10/2016   Current every day smoker 06/10/2016   Home Medication(s) Prior to Admission medications   Medication Sig Start Date End Date Taking? Authorizing Provider  acetaminophen (TYLENOL) 500 MG tablet Take 500 mg by mouth every 6 (six) hours as needed for moderate pain.   Yes [provider]  albuterol (PROAIR HFA) 108 (90 Base) MCG/ACT inhaler Inhale 2 puffs into the lungs once every 4 (four) hours as needed for wheezing or shortness of breath. 06/06/22  Yes Ladell Pier, MD  amLODipine (NORVASC) 5 MG tablet Take 1 tablet (5 mg total) by mouth daily. 01/04/22  Yes Ladell Pier, MD  atorvastatin (LIPITOR) 10 MG tablet Take 1 tablet (10 mg total) by mouth daily. 01/04/22  Yes Ladell Pier, MD  Fluticasone-Umeclidin-Vilant (TRELEGY ELLIPTA) 200-62.5-25 MCG/ACT AEPB Inhale 1 puff into the lungs daily. 01/04/22  Yes Ladell Pier, MD  ipratropium-albuterol (DUONEB) 0.5-2.5 (3) MG/3ML SOLN Take 3 mLs by nebulization every 6 (six) hours as needed (Shortness of breath or wheezing). 10/25/21 06/20/22 Yes Ladell Pier, MD  mirtazapine (REMERON) 15 MG tablet Take 0.5 tablets (7.5 mg total) by mouth at bedtime. Patient taking differently: Take 15 mg by mouth at bedtime. 05/09/22  Yes Ladell Pier, MD  pantoprazole (PROTONIX) 40 MG tablet Take 1 tablet (40 mg total) by mouth daily at 6 (six) AM. 02/14/22 06/20/22 Yes Ladell Pier, MD  Past Surgical History Past Surgical History:  Procedure Laterality Date   INCISE AND DRAIN ABCESS     dog bite   Family History Family History  Problem Relation Age of Onset   Hypertension Mother    Breast cancer Sister    Hypertension Sister    Colon cancer Brother    Colon polyps Neg Hx    Esophageal cancer Neg Hx     Rectal cancer Neg Hx    Stomach cancer Neg Hx     Social History Social History   Tobacco Use   Smoking status: Former    Packs/day: 0.50    Years: 45.00    Additional pack years: 0.00    Total pack years: 22.50    Types: Cigarettes    Quit date: 07/17/2021    Years since quitting: 0.9   Smokeless tobacco: Never  Vaping Use   Vaping Use: Never used  Substance Use Topics   Alcohol use: Yes    Alcohol/week: 2.0 standard drinks of alcohol    Types: 2 Cans of beer per week    Comment: Tries to stay alcohol free   Drug use: Not Currently    Types: "Crack" cocaine   Allergies Tobacco  Review of Systems Review of Systems  Respiratory:  Positive for cough, shortness of breath and wheezing.     Physical Exam Vital Signs  I have reviewed the triage vital signs BP (!) 136/90 (BP Location: Right Arm)   Pulse 73   Temp 97.6 F (36.4 C) (Oral)   Resp 16   Ht 5\' 7"  (1.702 m)   Wt 53.9 kg   SpO2 94%   BMI 18.61 kg/m   Physical Exam Constitutional:      General: He is not in acute distress.    Appearance: Normal appearance.  HENT:     Head: Normocephalic and atraumatic.     Nose: No congestion or rhinorrhea.  Eyes:     General:        Right eye: No discharge.        Left eye: No discharge.     Extraocular Movements: Extraocular movements intact.     Pupils: Pupils are equal, round, and reactive to light.  Cardiovascular:     Rate and Rhythm: Normal rate and regular rhythm.     Heart sounds: No murmur heard. Pulmonary:     Effort: No respiratory distress.     Breath sounds: Wheezing present. No rales.  Abdominal:     General: There is no distension.     Tenderness: There is no abdominal tenderness.  Musculoskeletal:        General: Normal range of motion.     Cervical back: Normal range of motion.  Skin:    General: Skin is warm and dry.  Neurological:     General: No focal deficit present.     Mental Status: He is alert.     ED Results and  Treatments Labs (all labs ordered are listed, but only abnormal results are displayed) Labs Reviewed  RESPIRATORY PANEL BY PCR - Abnormal; Notable for the following components:      Result Value   Metapneumovirus DETECTED (*)    All other components within normal limits  COMPREHENSIVE METABOLIC PANEL - Abnormal; Notable for the following components:   Potassium 3.1 (*)    CO2 19 (*)    Glucose, Bld 155 (*)    Creatinine, Ser 1.26 (*)    Calcium 8.8 (*)  AST 54 (*)    All other components within normal limits  CBC WITH DIFFERENTIAL/PLATELET - Abnormal; Notable for the following components:   RBC 3.94 (*)    Hemoglobin 12.9 (*)    HCT 38.5 (*)    All other components within normal limits  BRAIN NATRIURETIC PEPTIDE - Abnormal; Notable for the following components:   B Natriuretic Peptide 116.5 (*)    All other components within normal limits  D-DIMER, QUANTITATIVE - Abnormal; Notable for the following components:   D-Dimer, Quant 3.06 (*)    All other components within normal limits  BLOOD GAS, VENOUS - Abnormal; Notable for the following components:   pH, Ven 7.52 (*)    pCO2, Ven 27 (*)    pO2, Ven 54 (*)    All other components within normal limits  CBC - Abnormal; Notable for the following components:   RBC 3.79 (*)    Hemoglobin 12.3 (*)    HCT 37.1 (*)    All other components within normal limits  BASIC METABOLIC PANEL - Abnormal; Notable for the following components:   Potassium 3.0 (*)    CO2 20 (*)    Glucose, Bld 176 (*)    Calcium 8.7 (*)    All other components within normal limits  RESP PANEL BY RT-PCR (RSV, FLU A&B, COVID)  RVPGX2  MAGNESIUM  PROCALCITONIN  RAPID URINE DRUG SCREEN, HOSP PERFORMED  TROPONIN I (HIGH SENSITIVITY)  TROPONIN I (HIGH SENSITIVITY)                                                                                                                          Radiology CT Angio Chest PE W and/or Wo Contrast  Result Date:  06/20/2022 CLINICAL DATA:  Pulmonary embolism suspected, high probability. Shortness of breath. History of COPD. EXAM: CT ANGIOGRAPHY CHEST WITH CONTRAST TECHNIQUE: Multidetector CT imaging of the chest was performed using the standard protocol during bolus administration of intravenous contrast. Multiplanar CT image reconstructions and MIPs were obtained to evaluate the vascular anatomy. RADIATION DOSE REDUCTION: This exam was performed according to the departmental dose-optimization program which includes automated exposure control, adjustment of the mA and/or kV according to patient size and/or use of iterative reconstruction technique. CONTRAST:  55mL OMNIPAQUE IOHEXOL 350 MG/ML SOLN COMPARISON:  08/12/2021. FINDINGS: Cardiovascular: The heart is normal in size and there is no pericardial effusion. There is atherosclerotic calcification of the aorta without evidence of aneurysm. The pulmonary trunk is normal in caliber. No evidence of pulmonary embolism. Evaluation of the pulmonary arteries is limited due to respiratory motion artifact. Mediastinum/Nodes: No enlarged mediastinal, hilar, or axillary lymph nodes. Thyroid gland, trachea, and esophagus demonstrate no significant findings. Lungs/Pleura: Advanced emphysematous changes are present in the lungs. Airspace opacities are noted in the posterior aspect of the right upper lobe and lung bases bilaterally, greater on the right than on the left. No effusion or pneumothorax. Upper Abdomen: No acute abnormality. Musculoskeletal: No acute osseous abnormality. Review  of the MIP images confirms the above findings. IMPRESSION: 1. No evidence of pulmonary embolism. 2. Mild airspace opacities in the posterior aspect of the right upper lobe and lower lobes bilaterally, possible atelectasis, edema, or infiltrate. 3. Severe emphysema. 4. Aortic atherosclerosis. Electronically Signed   By: Brett Fairy M.D.   On: 06/20/2022 00:04   DG Chest Port 1 View  Result Date:  06/19/2022 CLINICAL DATA:  Cough, shortness of breath EXAM: PORTABLE CHEST 1 VIEW COMPARISON:  09/02/2021 FINDINGS: Patchy airspace opacities in the right upper lobe and right lower lobe concerning for pneumonia. Left lung clear. Heart is normal size. No effusions or acute bony abnormality. IMPRESSION: Patchy airspace disease in the right upper lobe and right lower lobe concerning for pneumonia. Electronically Signed   By: Rolm Baptise M.D.   On: 06/19/2022 21:38    Pertinent labs & imaging results that were available during my care of the patient were reviewed by me and considered in my medical decision making (see MDM for details).  Medications Ordered in ED Medications  azithromycin (ZITHROMAX) 500 mg in sodium chloride 0.9 % 250 mL IVPB (has no administration in time range)  cefTRIAXone (ROCEPHIN) 1 g in sodium chloride 0.9 % 100 mL IVPB (has no administration in time range)  albuterol (PROVENTIL) (2.5 MG/3ML) 0.083% nebulizer solution 2.5 mg (has no administration in time range)  enoxaparin (LOVENOX) injection 40 mg (40 mg Subcutaneous Given 06/20/22 0930)  acetaminophen (TYLENOL) tablet 650 mg (650 mg Oral Given 06/20/22 0342)    Or  acetaminophen (TYLENOL) suppository 650 mg ( Rectal See Alternative 06/20/22 0342)  methylPREDNISolone sodium succinate (SOLU-MEDROL) 40 mg/mL injection 40 mg (40 mg Intravenous Given 06/20/22 0930)  ipratropium-albuterol (DUONEB) 0.5-2.5 (3) MG/3ML nebulizer solution 3 mL (3 mLs Nebulization Given 06/20/22 0842)  Oral care mouth rinse (has no administration in time range)  LORazepam (ATIVAN) tablet 1-4 mg (has no administration in time range)  thiamine (VITAMIN B1) tablet 100 mg (100 mg Oral Given 06/20/22 0928)    Or  thiamine (VITAMIN B1) injection 100 mg ( Intravenous See Alternative [id number])  folic acid (FOLVITE) tablet 1 mg (1 mg Oral Given 06/20/22 0928)  multivitamin with minerals tablet 1 tablet (1 tablet Oral Given 06/20/22 0929)  potassium chloride SA  (KLOR-CON M) CR tablet 40 mEq (40 mEq Oral Given 06/20/22 0928)  nicotine (NICODERM CQ - dosed in mg/24 hr) patch 7 mg (has no administration in time range)  ipratropium-albuterol (DUONEB) 0.5-2.5 (3) MG/3ML nebulizer solution 6 mL (6 mLs Nebulization Given 06/19/22 2158)  cefTRIAXone (ROCEPHIN) 1 g in sodium chloride 0.9 % 100 mL IVPB (0 g Intravenous Stopped 06/19/22 2326)  azithromycin (ZITHROMAX) 500 mg in sodium chloride 0.9 % 250 mL IVPB (0 mg Intravenous Stopped 06/19/22 2356)  iohexol (OMNIPAQUE) 350 MG/ML injection 75 mL (75 mLs Intravenous Contrast Given 06/19/22 2335)  potassium chloride SA (KLOR-CON M) CR tablet 40 mEq (40 mEq Oral Given 06/20/22 0342)  Procedures .Critical Care  Performed by: Teressa Lower, MD Authorized by: Teressa Lower, MD   Critical care provider statement:    Critical care time (minutes):  30   Critical care was necessary to treat or prevent imminent or life-threatening deterioration of the following conditions:  Respiratory failure   Critical care was time spent personally by me on the following activities:  Development of treatment plan with patient or surrogate, discussions with consultants, evaluation of patient's response to treatment, examination of patient, ordering and review of laboratory studies, ordering and review of radiographic studies, ordering and performing treatments and interventions, pulse oximetry, re-evaluation of patient's condition and review of old charts   (including critical care time)  Medical Decision Making / ED Course   This patient presents to the ED for shortness of breath, this involves an extensive number of treatment options, and is a complaint that carries with it a high risk of complications and morbidity.  The differential diagnosis includes Pe, PTX, Pulmonary Edema, ARDS, COPD/Asthma, ACS, CHF  exacerbation, Arrhythmia, Pericardial Effusion/Tamponade, Anemia, Sepsis, Acidosis/Hypercapnia, Anxiety, Viral URI  MDM: Patient seen emergency room for evaluation of shortness of breath.  Physical exam reveals a tachypneic tachycardic patient requiring oxygen to maintain oxygen saturations.  Faint wheezing heard.  Patient received 2 additional DuoNeb's.  Laboratory evaluation with hypokalemia to 3.1, creatinine 1.26, hemoglobin 12.9 D-dimer 3.06, high-sensitivity troponin normal, pH 7.52 with no hypercarbia.  COVID, flu, RSV negative and obtained in the setting of shortness of breath.  Chest x-ray concerning for pneumonia and ceftriaxone azithromycin ordered.  With elevated D-dimer, tachypnea and tachycardia, CT PE was ordered.  This study is pending at time of signout.  Please see provider signout for continuation of workup.  Anticipate admission for shortness of breath with new oxygen requirement   Additional history obtained:  -External records from outside source obtained and reviewed including: Chart review including previous notes, labs, imaging, consultation notes   Lab Tests: -I ordered, reviewed, and interpreted labs.   The pertinent results include:   Labs Reviewed  RESPIRATORY PANEL BY PCR - Abnormal; Notable for the following components:      Result Value   Metapneumovirus DETECTED (*)    All other components within normal limits  COMPREHENSIVE METABOLIC PANEL - Abnormal; Notable for the following components:   Potassium 3.1 (*)    CO2 19 (*)    Glucose, Bld 155 (*)    Creatinine, Ser 1.26 (*)    Calcium 8.8 (*)    AST 54 (*)    All other components within normal limits  CBC WITH DIFFERENTIAL/PLATELET - Abnormal; Notable for the following components:   RBC 3.94 (*)    Hemoglobin 12.9 (*)    HCT 38.5 (*)    All other components within normal limits  BRAIN NATRIURETIC PEPTIDE - Abnormal; Notable for the following components:   B Natriuretic Peptide 116.5 (*)    All other  components within normal limits  D-DIMER, QUANTITATIVE - Abnormal; Notable for the following components:   D-Dimer, Quant 3.06 (*)    All other components within normal limits  BLOOD GAS, VENOUS - Abnormal; Notable for the following components:   pH, Ven 7.52 (*)    pCO2, Ven 27 (*)    pO2, Ven 54 (*)    All other components within normal limits  CBC - Abnormal; Notable for the following components:   RBC 3.79 (*)    Hemoglobin 12.3 (*)    HCT 37.1 (*)  All other components within normal limits  BASIC METABOLIC PANEL - Abnormal; Notable for the following components:   Potassium 3.0 (*)    CO2 20 (*)    Glucose, Bld 176 (*)    Calcium 8.7 (*)    All other components within normal limits  RESP PANEL BY RT-PCR (RSV, FLU A&B, COVID)  RVPGX2  MAGNESIUM  PROCALCITONIN  RAPID URINE DRUG SCREEN, HOSP PERFORMED  TROPONIN I (HIGH SENSITIVITY)  TROPONIN I (HIGH SENSITIVITY)      EKG   EKG Interpretation  Date/Time:  Sunday June 19 2022 21:23:20 EDT Ventricular Rate:  116 PR Interval:  171 QRS Duration: 71 QT Interval:  282 QTC Calculation: 392 R Axis:   80 Text Interpretation: Sinus tachycardia Biatrial enlargement Confirmed by Drummond ([id number]) on 06/19/2022 10:22:52 PM         Imaging Studies ordered: I ordered imaging studies including CXR I independently visualized and interpreted imaging. I agree with the radiologist interpretation  CTPE ordered and pending   Medicines ordered and prescription drug management: Meds ordered this encounter  Medications   ipratropium-albuterol (DUONEB) 0.5-2.5 (3) MG/3ML nebulizer solution 6 mL   cefTRIAXone (ROCEPHIN) 1 g in sodium chloride 0.9 % 100 mL IVPB    Order Specific Question:   Antibiotic Indication:    Answer:   CAP   azithromycin (ZITHROMAX) 500 mg in sodium chloride 0.9 % 250 mL IVPB   iohexol (OMNIPAQUE) 350 MG/ML injection 75 mL   azithromycin (ZITHROMAX) 500 mg in sodium chloride 0.9 % 250 mL IVPB    cefTRIAXone (ROCEPHIN) 1 g in sodium chloride 0.9 % 100 mL IVPB    Order Specific Question:   Antibiotic Indication:    Answer:   CAP   DISCONTD: ipratropium-albuterol (DUONEB) 0.5-2.5 (3) MG/3ML nebulizer solution 3 mL   albuterol (PROVENTIL) (2.5 MG/3ML) 0.083% nebulizer solution 2.5 mg   enoxaparin (LOVENOX) injection 40 mg   OR Linked Order Group    acetaminophen (TYLENOL) tablet 650 mg    acetaminophen (TYLENOL) suppository 650 mg   potassium chloride SA (KLOR-CON M) CR tablet 40 mEq   methylPREDNISolone sodium succinate (SOLU-MEDROL) 40 mg/mL injection 40 mg    IV methylprednisolone will be converted to either a q12h or q24h frequency with the same total daily dose (TDD).  Ordered Dose: 1 to 125 mg TDD; convert to: TDD q24h.  Ordered Dose: 126 to 250 mg TDD; convert to: TDD div q12h.  Ordered Dose: >250 mg TDD; DAW.   ipratropium-albuterol (DUONEB) 0.5-2.5 (3) MG/3ML nebulizer solution 3 mL   Oral care mouth rinse   LORazepam (ATIVAN) tablet 1-4 mg    Order Specific Question:   CIWA-AR < 5 =    Answer:   0 mg    Order Specific Question:   CIWA-AR 5 -10 =    Answer:   1 mg    Order Specific Question:   CIWA-AR 11 -15 =    Answer:   2 mg    Order Specific Question:   CIWA-AR 16 -20 =    Answer:   3 mg    Order Specific Question:   CIWA-AR 16 -20 =    Answer:   Recheck CIWA-AR in 1 hour; if > 20 notify MD    Order Specific Question:   CIWA-AR > 20 =    Answer:   4 mg    Order Specific Question:   CIWA-AR > 20 =    Answer:   Call Rapid Response  OR Linked Order Group    thiamine (VITAMIN B1) tablet 100 mg    thiamine (VITAMIN B1) injection 123XX123 mg   folic acid (FOLVITE) tablet 1 mg   multivitamin with minerals tablet 1 tablet   potassium chloride SA (KLOR-CON M) CR tablet 40 mEq   nicotine (NICODERM CQ - dosed in mg/24 hr) patch 7 mg    -I have reviewed the patients home medicines and have made adjustments as needed  Critical interventions Supplemental oxygen, multiple  DuoNebs, multiple antibiotics    Cardiac Monitoring: The patient was maintained on a cardiac monitor.  I personally viewed and interpreted the cardiac monitored which showed an underlying rhythm of: Sinus tachycardia  Social Determinants of Health:  Factors impacting patients care include: none   Reevaluation: After the interventions noted above, I reevaluated the patient and found that they have :improved  Co morbidities that complicate the patient evaluation  Past Medical History:  Diagnosis Date   Asthma    COPD (chronic obstructive pulmonary disease)    Hepatitis C antibody positive in blood    HLD (hyperlipidemia)    HTN (hypertension)    On home oxygen therapy    per pt 08-31-2021 uses 02 about every other day      Dispostion: I considered admission for this patient, and disposition pending completion of CT PE.  Anticipate admission     Final Clinical Impression(s) / ED Diagnoses Final diagnoses:  COPD exacerbation ([place])  Multifocal pneumonia  Hypoxia     @PCDICTATION @    Teressa Lower, MD 06/20/22 1105

## 2022-06-20 ENCOUNTER — Other Ambulatory Visit: Payer: Self-pay

## 2022-06-20 ENCOUNTER — Encounter (HOSPITAL_COMMUNITY): Payer: Self-pay | Admitting: Internal Medicine

## 2022-06-20 DIAGNOSIS — Z8249 Family history of ischemic heart disease and other diseases of the circulatory system: Secondary | ICD-10-CM | POA: Diagnosis not present

## 2022-06-20 DIAGNOSIS — E785 Hyperlipidemia, unspecified: Secondary | ICD-10-CM | POA: Diagnosis present

## 2022-06-20 DIAGNOSIS — J441 Chronic obstructive pulmonary disease with (acute) exacerbation: Secondary | ICD-10-CM | POA: Diagnosis present

## 2022-06-20 DIAGNOSIS — I10 Essential (primary) hypertension: Secondary | ICD-10-CM | POA: Diagnosis present

## 2022-06-20 DIAGNOSIS — J9601 Acute respiratory failure with hypoxia: Secondary | ICD-10-CM | POA: Diagnosis present

## 2022-06-20 DIAGNOSIS — Z800 Family history of malignant neoplasm of digestive organs: Secondary | ICD-10-CM | POA: Diagnosis not present

## 2022-06-20 DIAGNOSIS — F1410 Cocaine abuse, uncomplicated: Secondary | ICD-10-CM | POA: Diagnosis present

## 2022-06-20 DIAGNOSIS — J189 Pneumonia, unspecified organism: Secondary | ICD-10-CM | POA: Diagnosis present

## 2022-06-20 DIAGNOSIS — B9781 Human metapneumovirus as the cause of diseases classified elsewhere: Secondary | ICD-10-CM | POA: Diagnosis present

## 2022-06-20 DIAGNOSIS — Z681 Body mass index (BMI) 19 or less, adult: Secondary | ICD-10-CM | POA: Diagnosis not present

## 2022-06-20 DIAGNOSIS — R000 Tachycardia, unspecified: Secondary | ICD-10-CM | POA: Diagnosis present

## 2022-06-20 DIAGNOSIS — Z79899 Other long term (current) drug therapy: Secondary | ICD-10-CM | POA: Diagnosis not present

## 2022-06-20 DIAGNOSIS — Z9981 Dependence on supplemental oxygen: Secondary | ICD-10-CM | POA: Diagnosis not present

## 2022-06-20 DIAGNOSIS — J123 Human metapneumovirus pneumonia: Secondary | ICD-10-CM | POA: Diagnosis present

## 2022-06-20 DIAGNOSIS — K219 Gastro-esophageal reflux disease without esophagitis: Secondary | ICD-10-CM | POA: Diagnosis present

## 2022-06-20 DIAGNOSIS — Z7951 Long term (current) use of inhaled steroids: Secondary | ICD-10-CM | POA: Diagnosis not present

## 2022-06-20 DIAGNOSIS — E876 Hypokalemia: Secondary | ICD-10-CM | POA: Diagnosis present

## 2022-06-20 DIAGNOSIS — R5381 Other malaise: Secondary | ICD-10-CM | POA: Diagnosis present

## 2022-06-20 DIAGNOSIS — Z803 Family history of malignant neoplasm of breast: Secondary | ICD-10-CM | POA: Diagnosis not present

## 2022-06-20 DIAGNOSIS — F1010 Alcohol abuse, uncomplicated: Secondary | ICD-10-CM | POA: Diagnosis present

## 2022-06-20 DIAGNOSIS — Z1152 Encounter for screening for COVID-19: Secondary | ICD-10-CM | POA: Diagnosis not present

## 2022-06-20 DIAGNOSIS — J440 Chronic obstructive pulmonary disease with acute lower respiratory infection: Secondary | ICD-10-CM | POA: Diagnosis present

## 2022-06-20 DIAGNOSIS — B1920 Unspecified viral hepatitis C without hepatic coma: Secondary | ICD-10-CM | POA: Diagnosis present

## 2022-06-20 DIAGNOSIS — J432 Centrilobular emphysema: Secondary | ICD-10-CM | POA: Diagnosis present

## 2022-06-20 LAB — CBC
HCT: 37.1 % — ABNORMAL LOW (ref 39.0–52.0)
Hemoglobin: 12.3 g/dL — ABNORMAL LOW (ref 13.0–17.0)
MCH: 32.5 pg (ref 26.0–34.0)
MCHC: 33.2 g/dL (ref 30.0–36.0)
MCV: 97.9 fL (ref 80.0–100.0)
Platelets: 239 10*3/uL (ref 150–400)
RBC: 3.79 MIL/uL — ABNORMAL LOW (ref 4.22–5.81)
RDW: 13.4 % (ref 11.5–15.5)
WBC: 9.9 10*3/uL (ref 4.0–10.5)
nRBC: 0 % (ref 0.0–0.2)

## 2022-06-20 LAB — BASIC METABOLIC PANEL
Anion gap: 12 (ref 5–15)
BUN: 12 mg/dL (ref 8–23)
CO2: 20 mmol/L — ABNORMAL LOW (ref 22–32)
Calcium: 8.7 mg/dL — ABNORMAL LOW (ref 8.9–10.3)
Chloride: 106 mmol/L (ref 98–111)
Creatinine, Ser: 1.09 mg/dL (ref 0.61–1.24)
GFR, Estimated: >60 mL/min (ref >60)
Glucose, Bld: 176 mg/dL — ABNORMAL HIGH (ref 70–99)
Potassium: 3 mmol/L — ABNORMAL LOW (ref 3.5–5.1)
Sodium: 138 mmol/L (ref 135–145)

## 2022-06-20 LAB — PROCALCITONIN: Procalcitonin: 1.04 ng/mL

## 2022-06-20 LAB — RESPIRATORY PANEL BY PCR

## 2022-06-20 LAB — RESP PANEL BY RT-PCR (RSV, FLU A&B, COVID)  RVPGX2
Influenza A by PCR: NEGATIVE
Influenza B by PCR: NEGATIVE
Resp Syncytial Virus by PCR: NEGATIVE
SARS Coronavirus 2 by RT PCR: NEGATIVE

## 2022-06-20 LAB — MAGNESIUM: Magnesium: 1.9 mg/dL (ref 1.7–2.4)

## 2022-06-20 LAB — TROPONIN I (HIGH SENSITIVITY): Troponin I (High Sensitivity): 13 ng/L (ref <18)

## 2022-06-20 MED ORDER — IPRATROPIUM-ALBUTEROL 0.5-2.5 (3) MG/3ML IN SOLN: 3.0000 mL | Freq: Three times a day (TID) | RESPIRATORY_TRACT | Status: DC

## 2022-06-20 MED ORDER — ADULT MULTIVITAMIN W/MINERALS CH
1.0000 | ORAL_TABLET | Freq: Every day | ORAL | Status: DC
  Administered 2022-06-20 – 2022-06-23 (×4): 1 via ORAL
  Filled 2022-06-20 (×4): qty 1

## 2022-06-20 MED ORDER — POTASSIUM CHLORIDE CRYS ER 20 MEQ PO TBCR
40.0000 meq | EXTENDED_RELEASE_TABLET | ORAL | Status: AC
  Administered 2022-06-20 (×2): 40 meq via ORAL
  Filled 2022-06-20 (×2): qty 2

## 2022-06-20 MED ORDER — FOLIC ACID 1 MG PO TABS
1.0000 mg | ORAL_TABLET | Freq: Every day | ORAL | Status: DC
  Administered 2022-06-20 – 2022-06-23 (×4): 1 mg via ORAL
  Filled 2022-06-20 (×4): qty 1

## 2022-06-20 MED ORDER — ACETAMINOPHEN 650 MG RE SUPP: 650.0000 mg | Freq: Four times a day (QID) | RECTAL | Status: DC | PRN

## 2022-06-20 MED ORDER — SODIUM CHLORIDE 0.9 % IV SOLN
500.0000 mg | INTRAVENOUS | Status: DC
  Administered 2022-06-20 – 2022-06-22 (×3): 500 mg via INTRAVENOUS
  Filled 2022-06-20 (×4): qty 5

## 2022-06-20 MED ORDER — ENOXAPARIN SODIUM 40 MG/0.4ML IJ SOSY
40.0000 mg | PREFILLED_SYRINGE | INTRAMUSCULAR | Status: DC
  Administered 2022-06-20 – 2022-06-23 (×4): 40 mg via SUBCUTANEOUS
  Filled 2022-06-20 (×4): qty 0.4

## 2022-06-20 MED ORDER — IPRATROPIUM-ALBUTEROL 0.5-2.5 (3) MG/3ML IN SOLN
3.0000 mL | Freq: Four times a day (QID) | RESPIRATORY_TRACT | Status: DC
  Administered 2022-06-20 (×3): 3 mL via RESPIRATORY_TRACT
  Filled 2022-06-20 (×3): qty 3

## 2022-06-20 MED ORDER — THIAMINE MONONITRATE 100 MG PO TABS
100.0000 mg | ORAL_TABLET | Freq: Every day | ORAL | Status: DC
  Administered 2022-06-20 – 2022-06-23 (×4): 100 mg via ORAL
  Filled 2022-06-20 (×4): qty 1

## 2022-06-20 MED ORDER — SODIUM CHLORIDE 0.9 % IV SOLN
1.0000 g | INTRAVENOUS | Status: DC
  Administered 2022-06-20 – 2022-06-22 (×3): 1 g via INTRAVENOUS
  Filled 2022-06-20 (×3): qty 10

## 2022-06-20 MED ORDER — METHYLPREDNISOLONE SODIUM SUCC 40 MG IJ SOLR
40.0000 mg | Freq: Two times a day (BID) | INTRAMUSCULAR | Status: AC
  Administered 2022-06-20 – 2022-06-22 (×6): 40 mg via INTRAVENOUS
  Filled 2022-06-20 (×6): qty 1

## 2022-06-20 MED ORDER — ALBUTEROL SULFATE (2.5 MG/3ML) 0.083% IN NEBU: 2.5000 mg | INHALATION_SOLUTION | RESPIRATORY_TRACT | Status: DC | PRN

## 2022-06-20 MED ORDER — THIAMINE HCL 100 MG/ML IJ SOLN: 100.0000 mg | Freq: Every day | INTRAMUSCULAR | Status: DC

## 2022-06-20 MED ORDER — LORAZEPAM 1 MG PO TABS
1.0000 mg | ORAL_TABLET | ORAL | Status: DC | PRN
  Administered 2022-06-21: 1 mg via ORAL
  Administered 2022-06-22: 2 mg via ORAL
  Administered 2022-06-22 – 2022-06-23 (×2): 1 mg via ORAL
  Filled 2022-06-20 (×2): qty 1
  Filled 2022-06-20: qty 2
  Filled 2022-06-20: qty 1

## 2022-06-20 MED ORDER — NICOTINE 7 MG/24HR TD PT24
7.0000 mg | MEDICATED_PATCH | Freq: Every day | TRANSDERMAL | Status: DC
  Administered 2022-06-20 – 2022-06-23 (×4): 7 mg via TRANSDERMAL
  Filled 2022-06-20 (×4): qty 1

## 2022-06-20 MED ORDER — ORAL CARE MOUTH RINSE: 15.0000 mL | OROMUCOSAL | Status: DC | PRN

## 2022-06-20 MED ORDER — ACETAMINOPHEN 325 MG PO TABS
650.0000 mg | ORAL_TABLET | Freq: Four times a day (QID) | ORAL | Status: DC | PRN
  Administered 2022-06-20: 650 mg via ORAL
  Filled 2022-06-20: qty 2

## 2022-06-20 MED ORDER — POTASSIUM CHLORIDE CRYS ER 20 MEQ PO TBCR
40.0000 meq | EXTENDED_RELEASE_TABLET | Freq: Once | ORAL | Status: AC
  Administered 2022-06-20: 40 meq via ORAL
  Filled 2022-06-20: qty 2

## 2022-06-20 MED ORDER — ATORVASTATIN CALCIUM 10 MG PO TABS
10.0000 mg | ORAL_TABLET | Freq: Every day | ORAL | Status: DC
  Administered 2022-06-20 – 2022-06-23 (×4): 10 mg via ORAL
  Filled 2022-06-20 (×4): qty 1

## 2022-06-20 MED ORDER — AMLODIPINE BESYLATE 10 MG PO TABS
5.0000 mg | ORAL_TABLET | Freq: Every day | ORAL | Status: DC
  Administered 2022-06-20 – 2022-06-23 (×4): 5 mg via ORAL
  Filled 2022-06-20 (×4): qty 1

## 2022-06-20 NOTE — ED Provider Notes (Signed)
Nursing notes and vitals signs, including pulse oximetry, reviewed.  Summary of this visit's results, reviewed by myself:  EKG:  EKG Interpretation  Date/Time:  Sunday June 19 2022 21:23:20 EDT Ventricular Rate:  116 PR Interval:  171 QRS Duration: 71 QT Interval:  282 QTC Calculation: 392 R Axis:   80 Text Interpretation: Sinus tachycardia Biatrial enlargement Confirmed by Kommor, Madison ([id number]) on 06/19/2022 10:22:52 PM        Labs:  Results for orders placed or performed during the hospital encounter of 06/19/22 (from the past 24 hour(s))  Comprehensive metabolic panel     Status: Abnormal   Collection Time: 06/19/22  9:55 PM  Result Value Ref Range   Sodium 139 135 - 145 mmol/L   Potassium 3.1 (L) 3.5 - 5.1 mmol/L   Chloride 106 98 - 111 mmol/L   CO2 19 (L) 22 - 32 mmol/L   Glucose, Bld 155 (H) 70 - 99 mg/dL   BUN 11 8 - 23 mg/dL   Creatinine, Ser 1.26 (H) 0.61 - 1.24 mg/dL   Calcium 8.8 (L) 8.9 - 10.3 mg/dL   Total Protein 8.1 6.5 - 8.1 g/dL   Albumin 4.3 3.5 - 5.0 g/dL   AST 54 (H) 15 - 41 U/L   ALT 28 0 - 44 U/L   Alkaline Phosphatase 86 38 - 126 U/L   Total Bilirubin 1.1 0.3 - 1.2 mg/dL   GFR, Estimated >60 >60 mL/min   Anion gap 14 5 - 15  Troponin I (High Sensitivity)     Status: None   Collection Time: 06/19/22  9:55 PM  Result Value Ref Range   Troponin I (High Sensitivity) 14 <18 ng/L  CBC with Differential     Status: Abnormal   Collection Time: 06/19/22  9:55 PM  Result Value Ref Range   WBC 8.8 4.0 - 10.5 K/uL   RBC 3.94 (L) 4.22 - 5.81 MIL/uL   Hemoglobin 12.9 (L) 13.0 - 17.0 g/dL   HCT 38.5 (L) 39.0 - 52.0 %   MCV 97.7 80.0 - 100.0 fL   MCH 32.7 26.0 - 34.0 pg   MCHC 33.5 30.0 - 36.0 g/dL   RDW 13.4 11.5 - 15.5 %   Platelets 248 150 - 400 K/uL   nRBC 0.0 0.0 - 0.2 %   Neutrophils Relative % 83 %   Neutro Abs 7.3 1.7 - 7.7 K/uL   Lymphocytes Relative 12 %   Lymphs Abs 1.1 0.7 - 4.0 K/uL   Monocytes Relative 5 %   Monocytes Absolute 0.4  0.1 - 1.0 K/uL   Eosinophils Relative 0 %   Eosinophils Absolute 0.0 0.0 - 0.5 K/uL   Basophils Relative 0 %   Basophils Absolute 0.0 0.0 - 0.1 K/uL   Immature Granulocytes 0 %   Abs Immature Granulocytes 0.03 0.00 - 0.07 K/uL  Brain natriuretic peptide     Status: Abnormal   Collection Time: 06/19/22  9:55 PM  Result Value Ref Range   B Natriuretic Peptide 116.5 (H) 0.0 - 100.0 pg/mL  D-dimer, quantitative     Status: Abnormal   Collection Time: 06/19/22  9:55 PM  Result Value Ref Range   D-Dimer, Quant 3.06 (H) 0.00 - 0.50 ug/mL-FEU  Blood gas, venous (at [facility] and [facility])     Status: Abnormal   Collection Time: 06/19/22 10:05 PM  Result Value Ref Range   pH, Ven 7.52 (H) 7.25 - 7.43   pCO2, Ven 27 (L) 44 - 60 mmHg  pO2, Ven 54 (H) 32 - 45 mmHg   Bicarbonate 22.0 20.0 - 28.0 mmol/L   Acid-Base Excess 0.4 0.0 - 2.0 mmol/L   O2 Saturation 90.4 %   Patient temperature 37.0     Imaging Studies: CT Angio Chest PE W and/or Wo Contrast  Result Date: 06/20/2022 CLINICAL DATA:  Pulmonary embolism suspected, high probability. Shortness of breath. History of COPD. EXAM: CT ANGIOGRAPHY CHEST WITH CONTRAST TECHNIQUE: Multidetector CT imaging of the chest was performed using the standard protocol during bolus administration of intravenous contrast. Multiplanar CT image reconstructions and MIPs were obtained to evaluate the vascular anatomy. RADIATION DOSE REDUCTION: This exam was performed according to the departmental dose-optimization program which includes automated exposure control, adjustment of the mA and/or kV according to patient size and/or use of iterative reconstruction technique. CONTRAST:  22mL OMNIPAQUE IOHEXOL 350 MG/ML SOLN COMPARISON:  08/12/2021. FINDINGS: Cardiovascular: The heart is normal in size and there is no pericardial effusion. There is atherosclerotic calcification of the aorta without evidence of aneurysm. The pulmonary trunk is normal in caliber. No evidence of pulmonary  embolism. Evaluation of the pulmonary arteries is limited due to respiratory motion artifact. Mediastinum/Nodes: No enlarged mediastinal, hilar, or axillary lymph nodes. Thyroid gland, trachea, and esophagus demonstrate no significant findings. Lungs/Pleura: Advanced emphysematous changes are present in the lungs. Airspace opacities are noted in the posterior aspect of the right upper lobe and lung bases bilaterally, greater on the right than on the left. No effusion or pneumothorax. Upper Abdomen: No acute abnormality. Musculoskeletal: No acute osseous abnormality. Review of the MIP images confirms the above findings. IMPRESSION: 1. No evidence of pulmonary embolism. 2. Mild airspace opacities in the posterior aspect of the right upper lobe and lower lobes bilaterally, possible atelectasis, edema, or infiltrate. 3. Severe emphysema. 4. Aortic atherosclerosis. Electronically Signed   By: Brett Fairy M.D.   On: 06/20/2022 00:04   DG Chest Port 1 View  Result Date: 06/19/2022 CLINICAL DATA:  Cough, shortness of breath EXAM: PORTABLE CHEST 1 VIEW COMPARISON:  09/02/2021 FINDINGS: Patchy airspace opacities in the right upper lobe and right lower lobe concerning for pneumonia. Left lung clear. Heart is normal size. No effusions or acute bony abnormality. IMPRESSION: Patchy airspace disease in the right upper lobe and right lower lobe concerning for pneumonia. Electronically Signed   By: Rolm Baptise M.D.   On: 06/19/2022 21:38    1:10 AM Dr. Marlowe Sax to admit patient to hospitalist service.   Javen Ridings, MD 06/20/22 0111

## 2022-06-20 NOTE — H&P (Signed)
History and Physical    Jason Wong [id number] DOB: 03/11/1956 DOA: 06/19/2022  PCP: Ladell Pier, MD  Patient coming from: Home  Chief Complaint: Shortness of breath  HPI: Jason Wong is a 67 y.o. 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  He was given DuoNeb and Solu-Medrol by EMS.  He was tachycardic and significantly tachypneic on arrival to the ED.  Afebrile.  Oxygen saturation in the 80s and placed on supplemental oxygen.  Labs showing no leukocytosis, hemoglobin 12.9 (stable), potassium 3.1, bicarb 19 (slightly low on previous labs as well), anion gap 14, glucose 155, creatinine 1.2 (baseline 1.0), AST 54 (slightly elevated on labs done a month ago as well), ALT normal, alk phos and T. bili normal, troponin negative x 2, BNP 116, D-dimer 3.06, VBG with pH 7.52 and pCO2 27, COVID/influenza/RSV PCR negative.  CTA chest negative for PE but showing mild airspace opacities in the posterior aspect of the right upper lobe and lung bases bilaterally.  Showing severe emphysema. Patient received DuoNeb, ceftriaxone, and azithromycin.  [facility] called to admit.  Patient is a poor historian.  He reports 1 day history of shortness of breath, nonproductive cough, and wheezing.  He is using his home inhalers but they are not helping.  States normally he does not require home oxygen but had to use it yesterday.  States he stopped smoking cigarettes 5 or 6 months ago.  He reports improvement of his symptoms since after receiving treatments in ED.  Denies fevers or chest pain.  No other complaints.  Review of Systems:  Review of Systems  All other systems reviewed and are negative.   Past Medical History:  Diagnosis Date   Asthma    COPD (chronic obstructive pulmonary disease) (HCC)    Hepatitis C antibody  positive in blood    HLD (hyperlipidemia)    HTN (hypertension)    On home oxygen therapy    per pt 08-31-2021 uses 02 about every other day    Past Surgical History:  Procedure Laterality Date   INCISE AND DRAIN ABCESS     dog bite     reports that he quit smoking about 11 months ago. His smoking use included cigarettes. He has a 22.50 pack-year smoking history. He has never used smokeless tobacco. He reports current alcohol use of about 1.0 standard drink of alcohol per week. He reports that he does not currently use drugs after having used the following drugs: "Crack" cocaine.  Allergies  Allergen Reactions   Tobacco Shortness Of Breath and Other (See Comments)    CIGARETTE SMOKE TRIGGERS THE PATIENT'S RESPIRATORY ISSUES!!    Family History  Problem Relation Age of Onset   Hypertension Mother    Breast cancer Sister    Hypertension Sister    Colon cancer Brother    Colon polyps Neg Hx    Esophageal cancer Neg Hx    Rectal cancer Neg Hx    Stomach cancer Neg Hx     Prior to Admission medications   Medication Sig Start Date End Date Taking? Authorizing Provider  albuterol (PROAIR HFA) 108 (90 Base) MCG/ACT inhaler Inhale 2 puffs into the lungs once every 4 (four) hours as needed for wheezing or shortness of breath. 06/06/22   Ladell Pier, MD  amLODipine (NORVASC) 5 MG tablet Take 1 tablet (5 mg total) by mouth  daily. 01/04/22   Ladell Pier, MD  atorvastatin (LIPITOR) 10 MG tablet Take 1 tablet (10 mg total) by mouth daily. 01/04/22   Ladell Pier, MD  Fluticasone-Umeclidin-Vilant (TRELEGY ELLIPTA) 200-62.5-25 MCG/ACT AEPB Inhale 1 puff into the lungs daily. 01/04/22   Ladell Pier, MD  ipratropium-albuterol (DUONEB) 0.5-2.5 (3) MG/3ML SOLN Take 3 mLs by nebulization every 6 (six) hours as needed (Shortness of breath or wheezing). 10/25/21 01/23/22  Ladell Pier, MD  mirtazapine (REMERON) 15 MG tablet Take 0.5 tablets (7.5 mg total) by mouth at  bedtime. 05/09/22   Ladell Pier, MD  pantoprazole (PROTONIX) 40 MG tablet Take 1 tablet (40 mg total) by mouth daily at 6 (six) AM. 02/14/22 06/13/22  Ladell Pier, MD    Physical Exam: Vitals:   06/19/22 2230 06/19/22 2245 06/19/22 2300 06/20/22 0149  BP: (!) 132/98 (!) 141/80 131/83   Pulse: (!) 117 (!) 116 95   Resp: (!) 30 (!) 26 (!) 24   Temp:    98 F (36.7 C)  TempSrc:    Oral  SpO2: (!) 86% 92% 93%     Physical Exam Vitals reviewed.  Constitutional:      General: He is not in acute distress. HENT:     Head: Normocephalic and atraumatic.  Eyes:     Extraocular Movements: Extraocular movements intact.  Cardiovascular:     Rate and Rhythm: Normal rate and regular rhythm.     Pulses: Normal pulses.  Pulmonary:     Effort: Pulmonary effort is normal. No respiratory distress.     Breath sounds: No wheezing or rales.  Abdominal:     General: Bowel sounds are normal. There is no distension.     Palpations: Abdomen is soft.     Tenderness: There is no abdominal tenderness.  Musculoskeletal:     Cervical back: Normal range of motion.     Right lower leg: No edema.     Left lower leg: No edema.  Skin:    General: Skin is warm and dry.  Neurological:     General: No focal deficit present.     Mental Status: He is alert and oriented to person, place, and time.     Labs on Admission: I have personally reviewed following labs and imaging studies  CBC: Recent Labs  Lab 06/19/22 2155  WBC 8.8  NEUTROABS 7.3  HGB 12.9*  HCT 38.5*  MCV 97.7  PLT [id number]   Basic Metabolic Panel: Recent Labs  Lab 06/19/22 2155  NA 139  K 3.1*  CL 106  CO2 19*  GLUCOSE 155*  BUN 11  CREATININE 1.26*  CALCIUM 8.8*   GFR: CrCl cannot be calculated (Unknown ideal weight.). Liver Function Tests: Recent Labs  Lab 06/19/22 2155  AST 54*  ALT 28  ALKPHOS 86  BILITOT 1.1  PROT 8.1  ALBUMIN 4.3   No results for input(s): "LIPASE", "AMYLASE" in the last 168  hours. No results for input(s): "AMMONIA" in the last 168 hours. Coagulation Profile: No results for input(s): "INR", "PROTIME" in the last 168 hours. Cardiac Enzymes: No results for input(s): "CKTOTAL", "CKMB", "CKMBINDEX", "TROPONINI" in the last 168 hours. BNP (last 3 results) No results for input(s): "PROBNP" in the last 8760 hours. HbA1C: No results for input(s): "HGBA1C" in the last 72 hours. CBG: No results for input(s): "GLUCAP" in the last 168 hours. Lipid Profile: No results for input(s): "CHOL", "HDL", "LDLCALC", "TRIG", "CHOLHDL", "LDLDIRECT" in the last 72 hours.  Thyroid Function Tests: No results for input(s): "TSH", "T4TOTAL", "FREET4", "T3FREE", "THYROIDAB" in the last 72 hours. Anemia Panel: No results for input(s): "VITAMINB12", "FOLATE", "FERRITIN", "TIBC", "IRON", "RETICCTPCT" in the last 72 hours. Urine analysis:    Component Value Date/Time   COLORURINE YELLOW 06/14/2021 0715   APPEARANCEUR CLEAR 06/14/2021 0715   LABSPEC 1.012 06/14/2021 0715   PHURINE 5.0 06/14/2021 0715   GLUCOSEU NEGATIVE 06/14/2021 0715   HGBUR NEGATIVE 06/14/2021 0715   BILIRUBINUR NEGATIVE 06/14/2021 0715   KETONESUR NEGATIVE 06/14/2021 0715   PROTEINUR NEGATIVE 06/14/2021 0715   NITRITE NEGATIVE 06/14/2021 0715   LEUKOCYTESUR NEGATIVE 06/14/2021 0715    Radiological Exams on Admission: CT Angio Chest PE W and/or Wo Contrast  Result Date: 06/20/2022 CLINICAL DATA:  Pulmonary embolism suspected, high probability. Shortness of breath. History of COPD. EXAM: CT ANGIOGRAPHY CHEST WITH CONTRAST TECHNIQUE: Multidetector CT imaging of the chest was performed using the standard protocol during bolus administration of intravenous contrast. Multiplanar CT image reconstructions and MIPs were obtained to evaluate the vascular anatomy. RADIATION DOSE REDUCTION: This exam was performed according to the departmental dose-optimization program which includes automated exposure control, adjustment of  the mA and/or kV according to patient size and/or use of iterative reconstruction technique. CONTRAST:  19mL OMNIPAQUE IOHEXOL 350 MG/ML SOLN COMPARISON:  08/12/2021. FINDINGS: Cardiovascular: The heart is normal in size and there is no pericardial effusion. There is atherosclerotic calcification of the aorta without evidence of aneurysm. The pulmonary trunk is normal in caliber. No evidence of pulmonary embolism. Evaluation of the pulmonary arteries is limited due to respiratory motion artifact. Mediastinum/Nodes: No enlarged mediastinal, hilar, or axillary lymph nodes. Thyroid gland, trachea, and esophagus demonstrate no significant findings. Lungs/Pleura: Advanced emphysematous changes are present in the lungs. Airspace opacities are noted in the posterior aspect of the right upper lobe and lung bases bilaterally, greater on the right than on the left. No effusion or pneumothorax. Upper Abdomen: No acute abnormality. Musculoskeletal: No acute osseous abnormality. Review of the MIP images confirms the above findings. IMPRESSION: 1. No evidence of pulmonary embolism. 2. Mild airspace opacities in the posterior aspect of the right upper lobe and lower lobes bilaterally, possible atelectasis, edema, or infiltrate. 3. Severe emphysema. 4. Aortic atherosclerosis. Electronically Signed   By: Brett Fairy M.D.   On: 06/20/2022 00:04   DG Chest Port 1 View  Result Date: 06/19/2022 CLINICAL DATA:  Cough, shortness of breath EXAM: PORTABLE CHEST 1 VIEW COMPARISON:  09/02/2021 FINDINGS: Patchy airspace opacities in the right upper lobe and right lower lobe concerning for pneumonia. Left lung clear. Heart is normal size. No effusions or acute bony abnormality. IMPRESSION: Patchy airspace disease in the right upper lobe and right lower lobe concerning for pneumonia. Electronically Signed   By: Rolm Baptise M.D.   On: 06/19/2022 21:38    EKG: Independently reviewed.  Sinus tachycardia, baseline wander.  Assessment and  Plan  Acute hypoxemic respiratory failure secondary to acute COPD exacerbation and possible multifocal pneumonia Patient presenting with 1 day history of shortness breath, nonproductive cough, and wheezing.  Oxygen saturation in the 80s and initially tachypneic to the 30s.  VBG without evidence of hypercapnia.  He received Solu-Medrol and bronchodilator treatments after which his work of breathing improved.  Currently satting well on 2 L supplemental oxygen. CTA chest negative for PE but showing severe emphysema and mild airspace opacities in the posterior aspect of the right upper lobe and lung bases bilaterally.  ?Atelectasis. No history of CHF or  significant elevation of BNP. No fever or leukocytosis.  COVID/influenza/RSV PCR negative.  Continue treatment with ceftriaxone, azithromycin, Solu-Medrol 40 mg every 12 hours, DuoNeb every 6 hours, and albuterol neb every 4 hours as needed.  Pulmonary hygiene.  Check procalcitonin level and RVP ordered.  Continue supplemental oxygen, wean as tolerated.  Mild hypokalemia Monitor potassium and magnesium levels, continue to replace as needed.  History of substance abuse UDS ordered.  Hypertension: Blood pressure currently stable. Hyperlipidemia GERD Pharmacy med rec pending.  DVT prophylaxis: Lovenox Code Status: Full Code (discussed with the patient) Family Communication: No family available at this time. Level of care: Telemetry bed Admission status: It is my clinical opinion that referral for OBSERVATION is reasonable and necessary in this patient based on the above information provided. The aforementioned taken together are felt to place the patient at high risk for further clinical deterioration. However, it is anticipated that the patient may be medically stable for discharge from the hospital within 24 to 48 hours.   Shela Leff MD [facility]  If 7PM-7AM, please contact night-coverage [email or web address]  06/20/2022, 1:55 AM

## 2022-06-20 NOTE — Progress Notes (Signed)
Mobility Specialist - Progress Note   06/20/22 1254  Mobility  Activity Ambulated with assistance in hallway  Level of Assistance Contact guard assist, steadying assist  Assistive Device Front wheel walker  Distance Ambulated (ft) 120 ft  Activity Response Tolerated well  Mobility Referral Yes  $Mobility charge 1 Mobility   Pt received in recliner and agreeable to mobility. Unable to obtain proper O2 documentation due to accidentally throwing away notes. No complaints during session. Pt to bed after session with all needs met. Bed alarm turned on.  [facility]

## 2022-06-20 NOTE — Progress Notes (Signed)
PT demonstrated hans on understanding of Flutter device- NPC at this time.

## 2022-06-20 NOTE — Evaluation (Signed)
Physical Therapy Evaluation Patient Details Name: Jason Wong MRN: [id number] DOB: 28-May-1955 Today's Date: 06/20/2022  History of Present Illness  67 yo male admitted to hospital on 06/19/2022 due to progressive SOB and wheezing over the past 2 days requiring pt to utilize in home supplemental O2, and reporting no relief from inhalers. At presentation to ED O2 saturation in the 80s requiring supplemental O2, CTA negative for PE but showing opacities in RUL and B bases as well as severe emphysema. Pt admitted with dx of acute respiratory failure with hypoxia secondary to COPD with acute exacerbation with possible PNA. Pt has PMH including but not limited to: COPD, emphysema, tobacco abuse, hep C, HTN, and HDL.  Clinical Impression    Pt admitted with above diagnosis.  Pt currently with functional limitations due to the deficits listed below (see PT Problem List). Pt required encouragement to get OOB this am. Pt able to perform bed mobility with S, transfer with min guard, gait limited to 3 feet no AD and min guard due to pt desaturation to 78% on 2 L/min. Pt required 29s once seated in recliner and cues provided for pursed lip breathing to recover to 90% on 2 L/min. PT ensured pt remained >/= 90% on 2 L/min and all needs met prior to leaving pt in recliner. Nurse aware of pt desaturation with exertion and pt seated in recliner.  Pt will benefit from acute skilled PT to increase their independence and safety with mobility to allow discharge.        Recommendations for follow up therapy are one component of a multi-disciplinary discharge planning process, led by the attending physician.  Recommendations may be updated based on patient status, additional functional criteria and insurance authorization.  Follow Up Recommendations       Assistance Recommended at Discharge Intermittent Supervision/Assistance  Patient can return home with the following  A little help with walking and/or transfers;A little  help with bathing/dressing/bathroom;Assistance with cooking/housework;Assist for transportation;Help with stairs or ramp for entrance    Equipment Recommendations None recommended by PT (at this time, pending pt progression)  Recommendations for Other Services       Functional Status Assessment Patient has had a recent decline in their functional status and demonstrates the ability to make significant improvements in function in a reasonable and predictable amount of time.     Precautions / Restrictions Precautions Precautions: Fall;Other (comment) (monitor O2)      Mobility  Bed Mobility Overal bed mobility: Needs Assistance Bed Mobility: Supine to Sit     Supine to sit: Supervision     General bed mobility comments: HOB elevated    Transfers Overall transfer level: Needs assistance Equipment used: None Transfers: Sit to/from Stand Sit to Stand: Min guard           General transfer comment: posterior lean with initial standing    Ambulation/Gait Ambulation/Gait assistance: Min guard Gait Distance (Feet): 3 Feet Assistive device: None Gait Pattern/deviations: Shuffle, Step-to pattern Gait velocity: decreased     General Gait Details: gait limited due to pt desaturation to 78% on 2 L/min with cues for coordinated breathing. once seated pt required 29s to recover on 2 L/min to 90%. pt will benefit from reinforcement for proper breathing stratagies  Stairs            Wheelchair Mobility    Modified Rankin (Stroke Patients Only)       Balance Overall balance assessment: Needs assistance Sitting-balance support: Feet supported Sitting balance-Leahy Scale:  Fair     Standing balance support: No upper extremity supported Standing balance-Leahy Scale: Fair Standing balance comment: posterior lean, wide BOS                             Pertinent Vitals/Pain Pain Assessment Pain Assessment: No/denies pain    Home Living Family/patient  expects to be discharged to:: Private residence Living Arrangements: Other relatives Available Help at Discharge: Family;Available PRN/intermittently Type of Home: House Home Access: Stairs to enter Entrance Stairs-Rails: None Entrance Stairs-Number of Steps: 2   Home Layout: Two level;Able to live on main level with bedroom/bathroom Home Equipment: None Additional Comments: pt states lives with nephew    Prior Function Prior Level of Function : Independent/Modified Independent             Mobility Comments: IND with ADLs, self care tasks, IADLs no AD       Hand Dominance        Extremity/Trunk Assessment   Upper Extremity Assessment Upper Extremity Assessment:  (L hand amp of digits 2 and 3)    Lower Extremity Assessment Lower Extremity Assessment: Overall WFL for tasks assessed    Cervical / Trunk Assessment Cervical / Trunk Assessment: Normal  Communication   Communication: No difficulties  Cognition Arousal/Alertness: Awake/alert Behavior During Therapy: WFL for tasks assessed/performed Overall Cognitive Status: Within Functional Limits for tasks assessed                                          General Comments      Exercises     Assessment/Plan    PT Assessment Patient needs continued PT services  PT Problem List Decreased strength;Decreased activity tolerance;Decreased balance;Decreased mobility       PT Treatment Interventions DME instruction;Gait training;Stair training;Functional mobility training;Therapeutic activities;Therapeutic exercise;Balance training;Neuromuscular re-education;Patient/family education    PT Goals (Current goals can be found in the Care Plan section)  Acute Rehab PT Goals Patient Stated Goal: hang out with the guys on the block PT Goal Formulation: With patient Time For Goal Achievement: 06/20/22 Potential to Achieve Goals: Good    Frequency Min 3X/week     Co-evaluation                AM-PAC PT "6 Clicks" Mobility  Outcome Measure Help needed turning from your back to your side while in a flat bed without using bedrails?: None Help needed moving from lying on your back to sitting on the side of a flat bed without using bedrails?: A Little Help needed moving to and from a bed to a chair (including a wheelchair)?: A Little Help needed standing up from a chair using your arms (e.g., wheelchair or bedside chair)?: A Little Help needed to walk in hospital room?: A Lot Help needed climbing 3-5 steps with a railing? : A Lot 6 Click Score: 17    End of Session Equipment Utilized During Treatment: Gait belt Activity Tolerance: Patient limited by fatigue;Other (comment) (decreased O2 saturation) Patient left: in chair;with call bell/phone within reach;with chair alarm set Nurse Communication: Mobility status;Other (comment) (O2 desaturation) PT Visit Diagnosis: Unsteadiness on feet (R26.81);Other abnormalities of gait and mobility (R26.89);Muscle weakness (generalized) (M62.81)    Time: [id number] PT Time Calculation (min) (ACUTE ONLY): 26 min   Charges:   PT Evaluation $PT Eval Low Complexity: 1 Low PT Treatments $  Therapeutic Activity: 8-22 mins        Baird Lyons, PT   Adair Patter 06/20/2022, 10:55 AM

## 2022-06-20 NOTE — Progress Notes (Addendum)
PROGRESS NOTE    Jason Wong  [id number] DOB: [birth year] DOA: 06/19/2022 PCP: Ladell Pier, MD    Brief Narrative:   Jason Wong is a 67 y.o. 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  Patient also endorses nonproductive cough.  Has been using his home inhalers without improvement of symptoms.  States normally does not require home oxygen but had to use it the day prior to admission.  He reports stopping cigarette use 5-6 months ago but does have occasional relapses.  The ED, temperature 98.4 F, HR 119, RR 30, BP 153/98, SpO2 86% on room air.  WBC 8.8, hemoglobin 12.9, platelets 248.  Sodium 139, potassium 3.1, chloride 106, CO2 19, glucose 155, BUN 11, creatinine 1.26.  BNP 116.5.  High sensitive troponin 14.  Chest x-ray with patchy airspace disease right upper lobe and right lower lobe concerning for pneumonia.  CT angiogram chest negative for pulmonary embolism but notable mild airspace opacities posterior aspect of right upper lobe and lower lobes bilaterally, possible atelectasis, edema versus infiltrate, severe emphysema.  Patient was started on DuoNebs, ceftriaxone and azithromycin by EDP.  TRH consulted for admission for further evaluation and management of acute hypoxic respite failure secondary to commune acquired pneumonia, COPD exacerbation.  Assessment & Plan:   Acute hypoxic respiratory failure with hypoxia; POA Patient presenting to ED with progressive shortness of breath, wheezing and nonproductive cough.  Etiology likely multifactorial in setting of acute COPD exacerbation, metapneumovirus viral infection and community-acquired pneumonia. -- Continue treatment as below  Acute COPD  exacerbation Patient presenting with progressive shortness of breath, wheezing and nonproductive cough in the setting of continued intermittent use of tobacco.  Patient has oxygen at home to use as needed but typically at baseline does not require.  Was noted to be hypoxic on admission with SpO2 86% on room air. -- Solu-Medrol 40 mg IV q12h -- duonebs q6h -- Albuterol neb every 4 hours as needed wheezing/shortness of breath -- Continue supplemental oxygen, maintain SpO2 greater than 88% -- Ambulatory O2 screen tomorrow  Community-acquired pneumonia Chest x-ray and CT angiogram chest notable for consolidation right upper lobe and lower lobes bilaterally. -- Azithromycin 500 mg IV every 24 hours -- Ceftriaxone 1 g IV every 24 hours  Metapneumovirus viral infection Respiratory viral panel positive for metapneumovirus -- Supportive care, contact precaution  Hypokalemia Potassium 3.0, will continue to replete. -- Repeat electrolytes in the a.m. to include magnesium  Essential hypertension -- Amlodipine 5 mg p.o. daily  Hyperlipidemia -- Atorvastatin 10 mg p.o. daily  History of substance/cocaine abuse History of cocaine abuse, UDS positive on 06/14/2021.  Counseled on need for complete cessation/abstinence.  EtOH use disorder Counseled on need for complete cessation. -- CIWA protocol with symptom triggered Ativan -- Multivitamin, folate, thiamine  Tobacco use disorder Patient reports quit smoking 5 to 6 months ago; although does endorse occasional relapses.  Counseled on need for complete cessation/abstinence. -- Nicotine patch  Weakness/debility/deconditioning: Seen by PT with recommendation of home health on discharge. -- Continue therapy efforts while inpatient  DVT prophylaxis: enoxaparin (LOVENOX) injection 40 mg Start: 06/20/22 1000    Code Status: Full Code Family Communication: No family present at bedside this morning  Disposition Plan:  Level of care:  Telemetry  Status is: Inpatient Remains inpatient appropriate because: IV antibiotics, IV steroids, needs weaning off of Supple oxygen before ready for discharge home; anticipate 1-2 days    Consultants:  None  Procedures:  None  Antimicrobials:  Azithromycin 3/31.>> Ceftriaxone 3/31>>   Subjective: Patient seen examined at bedside, resting comfortably.  Sleeping but easily arousable.  Continues with shortness of breath, slightly improved since initial hospital presentation.  Remains on oxygen, 2 L per nasal cannula.  Continues with nonproductive cough.  No other specific complaints or concerns at this time.  Denies headache, no dizziness, no visual changes, no chest pain, no palpitations, no abdominal pain, no fever/chills/night sweats, no nausea/emesis diarrhea, no focal weakness, no fatigue, no paresthesias.  No acute events overnight per nursing staff.  Objective: Vitals:   06/20/22 0807 06/20/22 0843 06/20/22 1117 06/20/22 1434  BP: (!) 136/90  121/79   Pulse: 73  76   Resp: 16  16   Temp: 97.6 F (36.4 C)  98 F (36.7 C)   TempSrc: Oral  Oral   SpO2: 100% 94% 100% 94%  Weight:      Height:        Intake/Output Summary (Last 24 hours) at 06/20/2022 1503 Last data filed at 06/20/2022 0600 Gross per 24 hour  Intake 368.93 ml  Output --  Net 368.93 ml   Filed Weights   06/20/22 0319  Weight: 53.9 kg    Examination:  Physical Exam: GEN: NAD, alert and oriented x 3, chronically ill appearance, appears older than stated age HEENT: NCAT, PERRL, EOMI, sclera clear, MMM PULM: Breath sounds slight diminished bilateral bases, diffuse mid-to-late expiratory wheezing bilaterally, no crackles, normal respiratory effort without accessory muscle use, on 2 L nasal cannula with SpO2 95% at rest CV: RRR w/o M/G/R GI: abd soft, NTND, NABS, no R/G/M MSK: no peripheral edema, moves all extremities independently, noted left finger amputation NEURO: CN II-XII intact, no focal deficits,  sensation to light touch intact PSYCH: normal mood/affect Integumentary: dry/intact, no rashes or wounds    Data Reviewed: I have personally reviewed following labs and imaging studies  CBC: Recent Labs  Lab 06/19/22 2155 06/20/22 0421  WBC 8.8 9.9  NEUTROABS 7.3  --   HGB 12.9* 12.3*  HCT 38.5* 37.1*  MCV 97.7 97.9  PLT 248 [id number]   Basic Metabolic Panel: Recent Labs  Lab 06/19/22 2155 06/20/22 0421  NA 139 138  K 3.1* 3.0*  CL 106 106  CO2 19* 20*  GLUCOSE 155* 176*  BUN 11 12  CREATININE 1.26* 1.09  CALCIUM 8.8* 8.7*  MG  --  1.9   GFR: Estimated Creatinine Clearance: 50.1 mL/min (by C-G formula based on SCr of 1.09 mg/dL). Liver Function Tests: Recent Labs  Lab 06/19/22 2155  AST 54*  ALT 28  ALKPHOS 86  BILITOT 1.1  PROT 8.1  ALBUMIN 4.3   No results for input(s): "LIPASE", "AMYLASE" in the last 168 hours. No results for input(s): "AMMONIA" in the last 168 hours. Coagulation Profile: No results for input(s): "INR", "PROTIME" in the last 168 hours. Cardiac Enzymes: No results for input(s): "CKTOTAL", "CKMB", "CKMBINDEX", "TROPONINI" in the last 168 hours. BNP (last 3 results) No results for input(s): "PROBNP" in the last 8760 hours. HbA1C: No results for input(s): "HGBA1C" in the last 72 hours. CBG: No results for input(s): "GLUCAP" in the last 168 hours. Lipid Profile: No results for input(s): "CHOL", "HDL", "LDLCALC", "TRIG", "CHOLHDL", "LDLDIRECT" in the last 72 hours. Thyroid Function Tests: No  results for input(s): "TSH", "T4TOTAL", "FREET4", "T3FREE", "THYROIDAB" in the last 72 hours. Anemia Panel: No results for input(s): "VITAMINB12", "FOLATE", "FERRITIN", "TIBC", "IRON", "RETICCTPCT" in the last 72 hours. Sepsis Labs: Recent Labs  Lab 06/20/22 0421  PROCALCITON 1.04    Recent Results (from the past 240 hour(s))  Resp panel by RT-PCR (RSV, Flu A&B, Covid) Anterior Nasal Swab     Status: None   Collection Time: 06/20/22 12:57 AM    Specimen: Anterior Nasal Swab  Result Value Ref Range Status   SARS Coronavirus 2 by RT PCR NEGATIVE NEGATIVE Final    Comment: (NOTE) SARS-CoV-2 target nucleic acids are NOT DETECTED.  The SARS-CoV-2 RNA is generally detectable in upper respiratory specimens during the acute phase of infection. The lowest concentration of SARS-CoV-2 viral copies this assay can detect is 138 copies/mL. A negative result does not preclude SARS-Cov-2 infection and should not be used as the sole basis for treatment or other patient management decisions. A negative result may occur with  improper specimen collection/handling, submission of specimen other than nasopharyngeal swab, presence of viral mutation(s) within the areas targeted by this assay, and inadequate number of viral copies(<138 copies/mL). A negative result must be combined with clinical observations, patient history, and epidemiological information. The expected result is Negative.  Fact Sheet for Patients:  EntrepreneurPulse.com.au  Fact Sheet for Healthcare Providers:  IncredibleEmployment.be  This test is no t yet approved or cleared by the [place] FDA and  has been authorized for detection and/or diagnosis of SARS-CoV-2 by FDA under an Emergency Use Authorization (EUA). This EUA will remain  in effect (meaning this test can be used) for the duration of the COVID-19 declaration under Section 564(b)(1) of the Act, 21 U.S.C.section 360bbb-3(b)(1), unless the authorization is terminated  or revoked sooner.       Influenza A by PCR NEGATIVE NEGATIVE Final   Influenza B by PCR NEGATIVE NEGATIVE Final    Comment: (NOTE) The Xpert Xpress SARS-CoV-2/FLU/RSV plus assay is intended as an aid in the diagnosis of influenza from Nasopharyngeal swab specimens and should not be used as a sole basis for treatment. Nasal washings and aspirates are unacceptable for Xpert Xpress  SARS-CoV-2/FLU/RSV testing.  Fact Sheet for Patients: EntrepreneurPulse.com.au  Fact Sheet for Healthcare Providers: IncredibleEmployment.be  This test is not yet approved or cleared by the [place] FDA and has been authorized for detection and/or diagnosis of SARS-CoV-2 by FDA under an Emergency Use Authorization (EUA). This EUA will remain in effect (meaning this test can be used) for the duration of the COVID-19 declaration under Section 564(b)(1) of the Act, 21 U.S.C. section 360bbb-3(b)(1), unless the authorization is terminated or revoked.     Resp Syncytial Virus by PCR NEGATIVE NEGATIVE Final    Comment: (NOTE) Fact Sheet for Patients: EntrepreneurPulse.com.au  Fact Sheet for Healthcare Providers: IncredibleEmployment.be  This test is not yet approved or cleared by the [place] FDA and has been authorized for detection and/or diagnosis of SARS-CoV-2 by FDA under an Emergency Use Authorization (EUA). This EUA will remain in effect (meaning this test can be used) for the duration of the COVID-19 declaration under Section 564(b)(1) of the Act, 21 U.S.C. section 360bbb-3(b)(1), unless the authorization is terminated or revoked.  Performed at [facility], [place] [address], [place], [place] [address]   Respiratory (~20 pathogens) panel by PCR     Status: Abnormal   Collection Time: 06/20/22  5:00 AM   Specimen: Nasopharyngeal Swab; Respiratory  Result  Value Ref Range Status   Adenovirus NOT DETECTED NOT DETECTED Final   Coronavirus 229E NOT DETECTED NOT DETECTED Final    Comment: (NOTE) The Coronavirus on the Respiratory Panel, DOES NOT test for the novel  Coronavirus (2019 nCoV)    Coronavirus HKU1 NOT DETECTED NOT DETECTED Final   Coronavirus NL63 NOT DETECTED NOT DETECTED Final   Coronavirus OC43 NOT DETECTED NOT DETECTED Final   Metapneumovirus DETECTED (A) NOT  DETECTED Final   Rhinovirus / Enterovirus NOT DETECTED NOT DETECTED Final   Influenza A NOT DETECTED NOT DETECTED Final   Influenza B NOT DETECTED NOT DETECTED Final   Parainfluenza Virus 1 NOT DETECTED NOT DETECTED Final   Parainfluenza Virus 2 NOT DETECTED NOT DETECTED Final   Parainfluenza Virus 3 NOT DETECTED NOT DETECTED Final   Parainfluenza Virus 4 NOT DETECTED NOT DETECTED Final   Respiratory Syncytial Virus NOT DETECTED NOT DETECTED Final   Bordetella pertussis NOT DETECTED NOT DETECTED Final   Bordetella Parapertussis NOT DETECTED NOT DETECTED Final   Chlamydophila pneumoniae NOT DETECTED NOT DETECTED Final   Mycoplasma pneumoniae NOT DETECTED NOT DETECTED Final    Comment: Performed at [facility], [place] [address], [place], [place] [address]         Radiology Studies: CT Angio Chest PE W and/or Wo Contrast  Result Date: 06/20/2022 CLINICAL DATA:  Pulmonary embolism suspected, high probability. Shortness of breath. History of COPD. EXAM: CT ANGIOGRAPHY CHEST WITH CONTRAST TECHNIQUE: Multidetector CT imaging of the chest was performed using the standard protocol during bolus administration of intravenous contrast. Multiplanar CT image reconstructions and MIPs were obtained to evaluate the vascular anatomy. RADIATION DOSE REDUCTION: This exam was performed according to the departmental dose-optimization program which includes automated exposure control, adjustment of the mA and/or kV according to patient size and/or use of iterative reconstruction technique. CONTRAST:  14mL OMNIPAQUE IOHEXOL 350 MG/ML SOLN COMPARISON:  08/12/2021. FINDINGS: Cardiovascular: The heart is normal in size and there is no pericardial effusion. There is atherosclerotic calcification of the aorta without evidence of aneurysm. The pulmonary trunk is normal in caliber. No evidence of pulmonary embolism. Evaluation of the pulmonary arteries is limited due to respiratory motion artifact. Mediastinum/Nodes: No  enlarged mediastinal, hilar, or axillary lymph nodes. Thyroid gland, trachea, and esophagus demonstrate no significant findings. Lungs/Pleura: Advanced emphysematous changes are present in the lungs. Airspace opacities are noted in the posterior aspect of the right upper lobe and lung bases bilaterally, greater on the right than on the left. No effusion or pneumothorax. Upper Abdomen: No acute abnormality. Musculoskeletal: No acute osseous abnormality. Review of the MIP images confirms the above findings. IMPRESSION: 1. No evidence of pulmonary embolism. 2. Mild airspace opacities in the posterior aspect of the right upper lobe and lower lobes bilaterally, possible atelectasis, edema, or infiltrate. 3. Severe emphysema. 4. Aortic atherosclerosis. Electronically Signed   By: Brett Fairy M.D.   On: 06/20/2022 00:04   DG Chest Port 1 View  Result Date: 06/19/2022 CLINICAL DATA:  Cough, shortness of breath EXAM: PORTABLE CHEST 1 VIEW COMPARISON:  09/02/2021 FINDINGS: Patchy airspace opacities in the right upper lobe and right lower lobe concerning for pneumonia. Left lung clear. Heart is normal size. No effusions or acute bony abnormality. IMPRESSION: Patchy airspace disease in the right upper lobe and right lower lobe concerning for pneumonia. Electronically Signed   By: Rolm Baptise M.D.   On: 06/19/2022 21:38        Scheduled Meds:  enoxaparin (LOVENOX)  injection  40 mg Subcutaneous [id number]   folic acid  1 mg Oral Daily   ipratropium-albuterol  3 mL Nebulization Q6H   methylPREDNISolone (SOLU-MEDROL) injection  40 mg Intravenous Q12H   multivitamin with minerals  1 tablet Oral Daily   nicotine  7 mg Transdermal Daily   potassium chloride  40 mEq Oral Q4H   thiamine  100 mg Oral Daily   Or   thiamine  100 mg Intravenous Daily   Continuous Infusions:  azithromycin (ZITHROMAX) 500 mg in sodium chloride 0.9 % 250 mL IVPB     cefTRIAXone (ROCEPHIN)  IV       LOS: 0 days    Time spent: 52  minutes spent on chart review, discussion with nursing staff, consultants, updating family and interview/physical exam; more than 50% of that time was spent in counseling and/or coordination of care.    Lakesha Levinson J [place] DO [facility] Available via Epic secure chat 7am-7pm After these hours, please refer to coverage provider listed on [email or web address] 06/20/2022, 3:03 PM

## 2022-06-21 DIAGNOSIS — J441 Chronic obstructive pulmonary disease with (acute) exacerbation: Secondary | ICD-10-CM | POA: Diagnosis not present

## 2022-06-21 LAB — MAGNESIUM: Magnesium: 2.3 mg/dL (ref 1.7–2.4)

## 2022-06-21 LAB — RAPID URINE DRUG SCREEN, HOSP PERFORMED
Amphetamines: NOT DETECTED
Barbiturates: NOT DETECTED
Benzodiazepines: NOT DETECTED
Cocaine: POSITIVE — AB
Opiates: NOT DETECTED
Tetrahydrocannabinol: NOT DETECTED

## 2022-06-21 LAB — BASIC METABOLIC PANEL
Anion gap: 11 (ref 5–15)
BUN: 19 mg/dL (ref 8–23)
CO2: 18 mmol/L — ABNORMAL LOW (ref 22–32)
Calcium: 8.9 mg/dL (ref 8.9–10.3)
Chloride: 112 mmol/L — ABNORMAL HIGH (ref 98–111)
Creatinine, Ser: 0.98 mg/dL (ref 0.61–1.24)
GFR, Estimated: >60 mL/min (ref >60)
Glucose, Bld: 133 mg/dL — ABNORMAL HIGH (ref 70–99)
Potassium: 4.3 mmol/L (ref 3.5–5.1)
Sodium: 141 mmol/L (ref 135–145)

## 2022-06-21 MED ORDER — PANTOPRAZOLE SODIUM 40 MG PO TBEC
40.0000 mg | DELAYED_RELEASE_TABLET | Freq: Every day | ORAL | Status: DC
  Administered 2022-06-21 – 2022-06-23 (×3): 40 mg via ORAL
  Filled 2022-06-21 (×3): qty 1

## 2022-06-21 MED ORDER — IPRATROPIUM-ALBUTEROL 0.5-2.5 (3) MG/3ML IN SOLN
3.0000 mL | Freq: Two times a day (BID) | RESPIRATORY_TRACT | Status: DC
  Administered 2022-06-21 – 2022-06-23 (×5): 3 mL via RESPIRATORY_TRACT
  Filled 2022-06-21 (×5): qty 3

## 2022-06-21 MED ORDER — ARFORMOTEROL TARTRATE 15 MCG/2ML IN NEBU
15.0000 ug | INHALATION_SOLUTION | Freq: Two times a day (BID) | RESPIRATORY_TRACT | Status: DC
  Administered 2022-06-21 – 2022-06-23 (×4): 15 ug via RESPIRATORY_TRACT
  Filled 2022-06-21 (×4): qty 2

## 2022-06-21 MED ORDER — MIRTAZAPINE 15 MG PO TABS
15.0000 mg | ORAL_TABLET | Freq: Every day | ORAL | Status: DC
  Administered 2022-06-21 – 2022-06-22 (×2): 15 mg via ORAL
  Filled 2022-06-21 (×2): qty 1

## 2022-06-21 MED ORDER — BUDESONIDE 0.5 MG/2ML IN SUSP
0.5000 mg | Freq: Two times a day (BID) | RESPIRATORY_TRACT | Status: DC
  Administered 2022-06-21 – 2022-06-23 (×4): 0.5 mg via RESPIRATORY_TRACT
  Filled 2022-06-21 (×4): qty 2

## 2022-06-21 NOTE — Progress Notes (Signed)
PROGRESS NOTE    Jason Wong  [id number] DOB: April 24, 1955 DOA: 06/19/2022 PCP: Ladell Pier, MD    Brief Narrative:   Jason Wong is a 66 y.o. 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  Patient also endorses nonproductive cough.  Has been using his home inhalers without improvement of symptoms.  States normally does not require home oxygen but had to use it the day prior to admission.  He reports stopping cigarette use 5-6 months ago but does have occasional relapses.  The ED, temperature 98.4 F, HR 119, RR 30, BP 153/98, SpO2 86% on room air.  WBC 8.8, hemoglobin 12.9, platelets 248.  Sodium 139, potassium 3.1, chloride 106, CO2 19, glucose 155, BUN 11, creatinine 1.26.  BNP 116.5.  High sensitive troponin 14.  Chest x-ray with patchy airspace disease right upper lobe and right lower lobe concerning for pneumonia.  CT angiogram chest negative for pulmonary embolism but notable mild airspace opacities posterior aspect of right upper lobe and lower lobes bilaterally, possible atelectasis, edema versus infiltrate, severe emphysema.  Patient was started on DuoNebs, ceftriaxone and azithromycin by EDP.  TRH consulted for admission for further evaluation and management of acute hypoxic respite failure secondary to commune acquired pneumonia, COPD exacerbation.  Assessment & Plan:   Acute hypoxic respiratory failure with hypoxia; POA Patient presenting to ED with progressive shortness of breath, wheezing and nonproductive cough.  Etiology likely multifactorial in setting of acute COPD exacerbation, metapneumovirus viral infection and community-acquired pneumonia. -- Continue treatment as below  Acute COPD  exacerbation Patient presenting with progressive shortness of breath, wheezing and nonproductive cough in the setting of continued intermittent use of tobacco.  Patient has oxygen at home to use as needed but typically at baseline does not require.  Was noted to be hypoxic on admission with SpO2 86% on room air. -- Pulmicort neb twice daily -- Brovana neb twice daily -- Solu-Medrol 40 mg IV q12h -- duonebs q6h -- Albuterol neb every 4 hours as needed wheezing/shortness of breath -- Continue supplemental oxygen, maintain SpO2 > 88%; currently on 2 L nasal cannula with SpO2 93% at rest -- Ambulatory O2 screen pending for today  Community-acquired pneumonia Chest x-ray and CT angiogram chest notable for consolidation right upper lobe and lower lobes bilaterally. -- Azithromycin 500 mg IV every 24 hours -- Ceftriaxone 1 g IV every 24 hours  Metapneumovirus viral infection Respiratory viral panel positive for metapneumovirus -- Supportive care, contact precaution  Hypokalemia: Resolved Repleted.  Potassium 4.3, this morning. -- Repeat electrolytes in the a.m. to include magnesium  Essential hypertension -- Amlodipine 5 mg p.o. daily  Hyperlipidemia -- Atorvastatin 10 mg p.o. daily  History of substance/cocaine abuse History of cocaine abuse, UDS positive on 06/14/2021.  Counseled on need for complete cessation/abstinence.  EtOH use disorder Counseled on need for complete cessation. -- CIWA protocol with symptom triggered Ativan -- Multivitamin, folate, thiamine  Tobacco use disorder Patient reports quit smoking 5 to 6 months ago; although does endorse occasional relapses.  Counseled on need for complete cessation/abstinence. -- Nicotine patch  Weakness/debility/deconditioning: Seen by PT with recommendation of home health on discharge. -- Continue therapy efforts while inpatient  DVT prophylaxis: enoxaparin (LOVENOX) injection 40 mg Start: 06/20/22 1000  Code Status: Full  Code Family Communication: No family present at bedside this morning  Disposition Plan:  Level of care: Telemetry Status is: Inpatient Remains inpatient appropriate because: IV antibiotics, IV steroids, needs weaning off of oxygen before ready for discharge home with home health; anticipate 1-2 days    Consultants:  None  Procedures:  None  Antimicrobials:  Azithromycin 3/31.>> Ceftriaxone 3/31>>   Subjective: Patient seen examined at bedside, resting comfortably.  Sleeping but easily arousable.  Continues with shortness of breath, slightly improved since initial hospital presentation.  Remains on oxygen, 2 L per nasal cannula.  Continues with nonproductive cough.  No other specific complaints or concerns at this time.  Denies headache, no dizziness, no visual changes, no chest pain, no palpitations, no abdominal pain, no fever/chills/night sweats, no nausea/emesis diarrhea, no focal weakness, no fatigue, no paresthesias.  No acute events overnight per nursing staff.  Objective: Vitals:   06/20/22 2026 06/20/22 2117 06/21/22 0407 06/21/22 0826  BP: 112/77  122/86   Pulse: 66  61   Resp:      Temp: (!) 97.5 F (36.4 C)  97.7 F (36.5 C)   TempSrc: Oral  Oral   SpO2: 95% 95% 96% 93%  Weight:      Height:        Intake/Output Summary (Last 24 hours) at 06/21/2022 1159 Last data filed at 06/21/2022 0900 Gross per 24 hour  Intake 960 ml  Output --  Net 960 ml   Filed Weights   06/20/22 0319  Weight: 53.9 kg    Examination:  Physical Exam: GEN: NAD, alert and oriented x 3, chronically ill appearance, appears older than stated age HEENT: NCAT, PERRL, EOMI, sclera clear, MMM PULM: Breath sounds slight diminished bilateral bases, diffuse mid-to-late expiratory wheezing bilaterally, no crackles, normal respiratory effort without accessory muscle use, on 2 L nasal cannula with SpO2 95% at rest CV: RRR w/o M/G/R GI: abd soft, NTND, NABS, no R/G/M MSK: no peripheral edema,  moves all extremities independently, noted left finger amputation NEURO: CN II-XII intact, no focal deficits, sensation to light touch intact PSYCH: normal mood/affect Integumentary: dry/intact, no rashes or wounds    Data Reviewed: I have personally reviewed following labs and imaging studies  CBC: Recent Labs  Lab 06/19/22 2155 06/20/22 0421  WBC 8.8 9.9  NEUTROABS 7.3  --   HGB 12.9* 12.3*  HCT 38.5* 37.1*  MCV 97.7 97.9  PLT 248 [id number]   Basic Metabolic Panel: Recent Labs  Lab 06/19/22 2155 06/20/22 0421 06/21/22 0421  NA 139 138 141  K 3.1* 3.0* 4.3  CL 106 106 112*  CO2 19* 20* 18*  GLUCOSE 155* 176* 133*  BUN 11 12 19   CREATININE 1.26* 1.09 0.98  CALCIUM 8.8* 8.7* 8.9  MG  --  1.9 2.3   GFR: Estimated Creatinine Clearance: 55.8 mL/min (by C-G formula based on SCr of 0.98 mg/dL). Liver Function Tests: Recent Labs  Lab 06/19/22 2155  AST 54*  ALT 28  ALKPHOS 86  BILITOT 1.1  PROT 8.1  ALBUMIN 4.3   No results for input(s): "LIPASE", "AMYLASE" in the last 168 hours. No results for input(s): "AMMONIA" in the last 168 hours. Coagulation Profile: No results for input(s): "INR", "PROTIME" in the last 168 hours. Cardiac Enzymes: No results for input(s): "CKTOTAL", "CKMB", "CKMBINDEX", "TROPONINI" in the last 168 hours. BNP (last 3 results) No results for input(s): "PROBNP" in the last 8760 hours. HbA1C: No results for input(s): "HGBA1C" in the  last 72 hours. CBG: No results for input(s): "GLUCAP" in the last 168 hours. Lipid Profile: No results for input(s): "CHOL", "HDL", "LDLCALC", "TRIG", "CHOLHDL", "LDLDIRECT" in the last 72 hours. Thyroid Function Tests: No results for input(s): "TSH", "T4TOTAL", "FREET4", "T3FREE", "THYROIDAB" in the last 72 hours. Anemia Panel: No results for input(s): "VITAMINB12", "FOLATE", "FERRITIN", "TIBC", "IRON", "RETICCTPCT" in the last 72 hours. Sepsis Labs: Recent Labs  Lab 06/20/22 0421  PROCALCITON 1.04     Recent Results (from the past 240 hour(s))  Resp panel by RT-PCR (RSV, Flu A&B, Covid) Anterior Nasal Swab     Status: None   Collection Time: 06/20/22 12:57 AM   Specimen: Anterior Nasal Swab  Result Value Ref Range Status   SARS Coronavirus 2 by RT PCR NEGATIVE NEGATIVE Final    Comment: (NOTE) SARS-CoV-2 target nucleic acids are NOT DETECTED.  The SARS-CoV-2 RNA is generally detectable in upper respiratory specimens during the acute phase of infection. The lowest concentration of SARS-CoV-2 viral copies this assay can detect is 138 copies/mL. A negative result does not preclude SARS-Cov-2 infection and should not be used as the sole basis for treatment or other patient management decisions. A negative result may occur with  improper specimen collection/handling, submission of specimen other than nasopharyngeal swab, presence of viral mutation(s) within the areas targeted by this assay, and inadequate number of viral copies(<138 copies/mL). A negative result must be combined with clinical observations, patient history, and epidemiological information. The expected result is Negative.  Fact Sheet for Patients:  EntrepreneurPulse.com.au  Fact Sheet for Healthcare Providers:  IncredibleEmployment.be  This test is no t yet approved or cleared by the [place] FDA and  has been authorized for detection and/or diagnosis of SARS-CoV-2 by FDA under an Emergency Use Authorization (EUA). This EUA will remain  in effect (meaning this test can be used) for the duration of the COVID-19 declaration under Section 564(b)(1) of the Act, 21 U.S.C.section 360bbb-3(b)(1), unless the authorization is terminated  or revoked sooner.       Influenza A by PCR NEGATIVE NEGATIVE Final   Influenza B by PCR NEGATIVE NEGATIVE Final    Comment: (NOTE) The Xpert Xpress SARS-CoV-2/FLU/RSV plus assay is intended as an aid in the diagnosis of influenza from  Nasopharyngeal swab specimens and should not be used as a sole basis for treatment. Nasal washings and aspirates are unacceptable for Xpert Xpress SARS-CoV-2/FLU/RSV testing.  Fact Sheet for Patients: EntrepreneurPulse.com.au  Fact Sheet for Healthcare Providers: IncredibleEmployment.be  This test is not yet approved or cleared by the [place] FDA and has been authorized for detection and/or diagnosis of SARS-CoV-2 by FDA under an Emergency Use Authorization (EUA). This EUA will remain in effect (meaning this test can be used) for the duration of the COVID-19 declaration under Section 564(b)(1) of the Act, 21 U.S.C. section 360bbb-3(b)(1), unless the authorization is terminated or revoked.     Resp Syncytial Virus by PCR NEGATIVE NEGATIVE Final    Comment: (NOTE) Fact Sheet for Patients: EntrepreneurPulse.com.au  Fact Sheet for Healthcare Providers: IncredibleEmployment.be  This test is not yet approved or cleared by the [place] FDA and has been authorized for detection and/or diagnosis of SARS-CoV-2 by FDA under an Emergency Use Authorization (EUA). This EUA will remain in effect (meaning this test can be used) for the duration of the COVID-19 declaration under Section 564(b)(1) of the Act, 21 U.S.C. section 360bbb-3(b)(1), unless the authorization is terminated or revoked.  Performed at [facility], [place]  [address], [place], [place] [address]   Respiratory (~20 pathogens) panel by PCR     Status: Abnormal   Collection Time: 06/20/22  5:00 AM   Specimen: Nasopharyngeal Swab; Respiratory  Result Value Ref Range Status   Adenovirus NOT DETECTED NOT DETECTED Final   Coronavirus 229E NOT DETECTED NOT DETECTED Final    Comment: (NOTE) The Coronavirus on the Respiratory Panel, DOES NOT test for the novel  Coronavirus (2019 nCoV)    Coronavirus HKU1 NOT DETECTED NOT DETECTED  Final   Coronavirus NL63 NOT DETECTED NOT DETECTED Final   Coronavirus OC43 NOT DETECTED NOT DETECTED Final   Metapneumovirus DETECTED (A) NOT DETECTED Final   Rhinovirus / Enterovirus NOT DETECTED NOT DETECTED Final   Influenza A NOT DETECTED NOT DETECTED Final   Influenza B NOT DETECTED NOT DETECTED Final   Parainfluenza Virus 1 NOT DETECTED NOT DETECTED Final   Parainfluenza Virus 2 NOT DETECTED NOT DETECTED Final   Parainfluenza Virus 3 NOT DETECTED NOT DETECTED Final   Parainfluenza Virus 4 NOT DETECTED NOT DETECTED Final   Respiratory Syncytial Virus NOT DETECTED NOT DETECTED Final   Bordetella pertussis NOT DETECTED NOT DETECTED Final   Bordetella Parapertussis NOT DETECTED NOT DETECTED Final   Chlamydophila pneumoniae NOT DETECTED NOT DETECTED Final   Mycoplasma pneumoniae NOT DETECTED NOT DETECTED Final    Comment: Performed at [place], [address], [place], [place] [address]         Radiology Studies: CT Angio Chest PE W and/or Wo Contrast  Result Date: 06/20/2022 CLINICAL DATA:  Pulmonary embolism suspected, high probability. Shortness of breath. History of COPD. EXAM: CT ANGIOGRAPHY CHEST WITH CONTRAST TECHNIQUE: Multidetector CT imaging of the chest was performed using the standard protocol during bolus administration of intravenous contrast. Multiplanar CT image reconstructions and MIPs were obtained to evaluate the vascular anatomy. RADIATION DOSE REDUCTION: This exam was performed according to the departmental dose-optimization program which includes automated exposure control, adjustment of the mA and/or kV according to patient size and/or use of iterative reconstruction technique. CONTRAST:  30mL OMNIPAQUE IOHEXOL 350 MG/ML SOLN COMPARISON:  08/12/2021. FINDINGS: Cardiovascular: The heart is normal in size and there is no pericardial effusion. There is atherosclerotic calcification of the aorta without evidence of aneurysm. The pulmonary trunk is normal in  caliber. No evidence of pulmonary embolism. Evaluation of the pulmonary arteries is limited due to respiratory motion artifact. Mediastinum/Nodes: No enlarged mediastinal, hilar, or axillary lymph nodes. Thyroid gland, trachea, and esophagus demonstrate no significant findings. Lungs/Pleura: Advanced emphysematous changes are present in the lungs. Airspace opacities are noted in the posterior aspect of the right upper lobe and lung bases bilaterally, greater on the right than on the left. No effusion or pneumothorax. Upper Abdomen: No acute abnormality. Musculoskeletal: No acute osseous abnormality. Review of the MIP images confirms the above findings. IMPRESSION: 1. No evidence of pulmonary embolism. 2. Mild airspace opacities in the posterior aspect of the right upper lobe and lower lobes bilaterally, possible atelectasis, edema, or infiltrate. 3. Severe emphysema. 4. Aortic atherosclerosis. Electronically Signed   By: Brett Fairy M.D.   On: 06/20/2022 00:04   DG Chest Port 1 View  Result Date: 06/19/2022 CLINICAL DATA:  Cough, shortness of breath EXAM: PORTABLE CHEST 1 VIEW COMPARISON:  09/02/2021 FINDINGS: Patchy airspace opacities in the right upper lobe and right lower lobe concerning for pneumonia. Left lung clear. Heart is normal size. No effusions or acute bony abnormality. IMPRESSION: Patchy airspace disease in the right  upper lobe and right lower lobe concerning for pneumonia. Electronically Signed   By: Rolm Baptise M.D.   On: 06/19/2022 21:38        Scheduled Meds:  amLODipine  5 mg Oral Daily   atorvastatin  10 mg Oral Daily   enoxaparin (LOVENOX) injection  40 mg Subcutaneous [id number]   folic acid  1 mg Oral Daily   ipratropium-albuterol  3 mL Nebulization BID   methylPREDNISolone (SOLU-MEDROL) injection  40 mg Intravenous Q12H   mirtazapine  15 mg Oral QHS   multivitamin with minerals  1 tablet Oral Daily   nicotine  7 mg Transdermal Daily   pantoprazole  40 mg Oral Daily    thiamine  100 mg Oral Daily   Continuous Infusions:  azithromycin (ZITHROMAX) 500 mg in sodium chloride 0.9 % 250 mL IVPB 500 mg (06/20/22 2251)   cefTRIAXone (ROCEPHIN)  IV 1 g (06/20/22 2212)     LOS: 1 day    Time spent: 52 minutes spent on chart review, discussion with nursing staff, consultants, updating family and interview/physical exam; more than 50% of that time was spent in counseling and/or coordination of care.    Ashyr Hedgepath J [place] DO [facility] Available via Epic secure chat 7am-7pm After these hours, please refer to coverage provider listed on [email or web address] 06/21/2022, 11:59 AM

## 2022-06-21 NOTE — Plan of Care (Signed)
  Problem: Clinical Measurements: Goal: Diagnostic test results will improve Outcome: Progressing Goal: Respiratory complications will improve Outcome: Progressing   Problem: Activity: Goal: Risk for activity intolerance will decrease Outcome: Progressing   Problem: Nutrition: Goal: Adequate nutrition will be maintained Outcome: Progressing   Problem: Safety: Goal: Ability to remain free from injury will improve Outcome: Progressing   

## 2022-06-21 NOTE — Progress Notes (Signed)
Mobility Specialist - Progress Note   06/21/22 1327  Mobility  Activity Ambulated with assistance in hallway  Level of Assistance Standby assist, set-up cues, supervision of patient - no hands on  Assistive Device Front wheel walker  Distance Ambulated (ft) 200 ft  Activity Response Tolerated well  Mobility Referral Yes  $Mobility charge 1 Mobility   Pt received in bed and agreed to mobility, had no issues throughout session beside fatigue. Pt returned to chair with all needs met.  Roderick Pee Mobility Specialist

## 2022-06-22 ENCOUNTER — Ambulatory Visit: Payer: Medicare Other | Admitting: Neurology

## 2022-06-22 ENCOUNTER — Encounter: Payer: Self-pay | Admitting: Neurology

## 2022-06-22 ENCOUNTER — Telehealth: Payer: Self-pay | Admitting: Neurology

## 2022-06-22 DIAGNOSIS — J441 Chronic obstructive pulmonary disease with (acute) exacerbation: Secondary | ICD-10-CM | POA: Diagnosis not present

## 2022-06-22 LAB — BASIC METABOLIC PANEL
Anion gap: 9 (ref 5–15)
BUN: 19 mg/dL (ref 8–23)
CO2: 21 mmol/L — ABNORMAL LOW (ref 22–32)
Calcium: 8.9 mg/dL (ref 8.9–10.3)
Chloride: 110 mmol/L (ref 98–111)
Creatinine, Ser: 0.89 mg/dL (ref 0.61–1.24)
GFR, Estimated: >60 mL/min (ref >60)
Glucose, Bld: 130 mg/dL — ABNORMAL HIGH (ref 70–99)
Potassium: 4.3 mmol/L (ref 3.5–5.1)
Sodium: 140 mmol/L (ref 135–145)

## 2022-06-22 LAB — MAGNESIUM: Magnesium: 2.5 mg/dL — ABNORMAL HIGH (ref 1.7–2.4)

## 2022-06-22 MED ORDER — PREDNISONE 20 MG PO TABS
40.0000 mg | ORAL_TABLET | Freq: Every day | ORAL | Status: DC
  Administered 2022-06-23: 40 mg via ORAL
  Filled 2022-06-22: qty 2

## 2022-06-22 NOTE — Telephone Encounter (Signed)
New Patient no showed today but looks like he is unfortunately in the hospital. You can reschedule him with the next available neurologist so he doesn' thave to wait months again, doesn' t have to be me. Wish him well. Vivien Rota

## 2022-06-22 NOTE — TOC Progression Note (Signed)
Transition of Care [facility]) - Progression Note    Patient Details  Name: Jason Wong MRN: [id number] Date of Birth: Sep 03, 1955  Transition of [facility]) CM/SW [place], LCSW Phone Number: 06/22/2022, 3:47 PM  Clinical Narrative:     [name] is arranged with [facility]. TOC to follow for d/c needs.   Expected Discharge Plan: [place] Barriers to Discharge: Continued Medical Work up  Expected Discharge Plan and Services       Living arrangements for the past 2 months: Single Family Home                                       Social Determinants of Health (SDOH) Interventions SDOH Screenings   Food Insecurity: No Food Insecurity (06/20/2022)  Housing: Low Risk  (06/20/2022)  Transportation Needs: No Transportation Needs (06/20/2022)  Utilities: Not At Risk (06/20/2022)  Depression (PHQ2-9): Low Risk  (05/09/2022)  Tobacco Use: Medium Risk (06/20/2022)    Readmission Risk Interventions    06/21/2021    2:22 PM  Readmission Risk Prevention Plan  Transportation Screening Complete  PCP or Specialist Appt within 5-7 Days Complete  Home Care Screening Complete  Medication Review (RN CM) Complete

## 2022-06-22 NOTE — Progress Notes (Addendum)
PROGRESS NOTE    Jason Wong  [id number] DOB: [birth year] DOA: 06/19/2022 PCP: Ladell Pier, MD    Brief Narrative:   Jason Wong is a 67 y.o. 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  Patient also endorses nonproductive cough.  Has been using his home inhalers without improvement of symptoms.  States normally does not require home oxygen but had to use it the day prior to admission.  He reports stopping cigarette use 5-6 months ago but does have occasional relapses.  The ED, temperature 98.4 F, HR 119, RR 30, BP 153/98, SpO2 86% on room air.  WBC 8.8, hemoglobin 12.9, platelets 248.  Sodium 139, potassium 3.1, chloride 106, CO2 19, glucose 155, BUN 11, creatinine 1.26.  BNP 116.5.  High sensitive troponin 14.  Chest x-ray with patchy airspace disease right upper lobe and right lower lobe concerning for pneumonia.  CT angiogram chest negative for pulmonary embolism but notable mild airspace opacities posterior aspect of right upper lobe and lower lobes bilaterally, possible atelectasis, edema versus infiltrate, severe emphysema.  Patient was started on DuoNebs, ceftriaxone and azithromycin by EDP.  TRH consulted for admission for further evaluation and management of acute hypoxic respite failure secondary to commune acquired pneumonia, COPD exacerbation.  Assessment & Plan:   Acute hypoxic respiratory failure with hypoxia; POA Patient presenting to ED with progressive shortness of breath, wheezing and nonproductive cough.  Etiology likely multifactorial in setting of acute COPD exacerbation, metapneumovirus viral infection and community-acquired pneumonia. -- Continue treatment as below  Acute COPD  exacerbation Patient presenting with progressive shortness of breath, wheezing and nonproductive cough in the setting of continued intermittent use of tobacco.  Patient has oxygen at home to use as needed but typically at baseline does not require.  Was noted to be hypoxic on admission with SpO2 86% on room air. -- Pulmicort neb twice daily -- Brovana neb twice daily -- Solu-Medrol 40 mg IV q12h-- change to PO in the AM -- duonebs q6h -- Albuterol neb every 4 hours as needed wheezing/shortness of breath -- Continue supplemental oxygen, maintain SpO2 > 88%; currently on 2 L nasal cannula with SpO2 93% at rest -- Ambulatory O2 screen pending for today  Community-acquired pneumonia Chest x-ray and CT angiogram chest notable for consolidation right upper lobe and lower lobes bilaterally. -- Azithromycin 500 mg IV every 24 hours -- Ceftriaxone 1 g IV every 24 hours  Metapneumovirus viral infection Respiratory viral panel positive for metapneumovirus -- Supportive care, contact precaution  Hypokalemia: Resolved Repleted.  Potassium 4.3, this morning. -- Repeat electrolytes in the a.m. to include magnesium  Essential hypertension -- Amlodipine 5 mg p.o. daily  Hyperlipidemia -- Atorvastatin 10 mg p.o. daily  History of substance/cocaine abuse History of cocaine abuse, UDS positive on 06/14/2021.  Counseled on need for complete cessation/abstinence.  EtOH abuse Counseled on need for complete cessation. -- CIWA protocol with symptom triggered Ativan -- Multivitamin, folate, thiamine  Tobacco use disorder Patient reports quit smoking 5 to 6 months ago; although does endorse occasional relapses.  Counseled on need for complete cessation/abstinence. -- Nicotine patch  Weakness/debility/deconditioning: Seen by PT with recommendation of home health on discharge. -- Continue therapy efforts while inpatient  DVT prophylaxis: enoxaparin (LOVENOX) injection 40 mg  Start: 06/20/22 1000     Code Status: Full Code Family Communication: No family present at bedside this morning  Disposition Plan:  Level of care: Telemetry Status is: Inpatient Remains inpatient appropriate because:may need O2 at home   Antimicrobials:  Azithromycin 3/31.>> Ceftriaxone 3/31>>   Subjective: Thinks he may have been on Oxygen at home in the past  Objective: Vitals:   06/22/22 0420 06/22/22 0900 06/22/22 0903 06/22/22 0925  BP: 131/83     Pulse: 66 68    Resp:      Temp: 97.7 F (36.5 C)     TempSrc: Oral     SpO2: 94%  94% 95%  Weight:      Height:        Intake/Output Summary (Last 24 hours) at 06/22/2022 1106 Last data filed at 06/22/2022 0900 Gross per 24 hour  Intake 1455.1 ml  Output 350 ml  Net 1105.1 ml   Filed Weights   06/20/22 0319  Weight: 53.9 kg    Examination:  Physical Exam:  General: Appearance:    Thin male in no acute distress     Lungs:     On [place], diminished, respirations unlabored  Heart:    Normal heart rate. Normal rhythm. No murmurs, rubs, or gallops.   MS:   All extremities are intact.   Neurologic:   Awake, alert, oriented x 3. No apparent focal neurological           defect.       Data Reviewed: I have personally reviewed following labs and imaging studies  CBC: Recent Labs  Lab 06/19/22 2155 06/20/22 0421  WBC 8.8 9.9  NEUTROABS 7.3  --   HGB 12.9* 12.3*  HCT 38.5* 37.1*  MCV 97.7 97.9  PLT 248 [id number]   Basic Metabolic Panel: Recent Labs  Lab 06/19/22 2155 06/20/22 0421 06/21/22 0421 06/22/22 0444  NA 139 138 141 140  K 3.1* 3.0* 4.3 4.3  CL 106 106 112* 110  CO2 19* 20* 18* 21*  GLUCOSE 155* 176* 133* 130*  BUN 11 12 19 19   CREATININE 1.26* 1.09 0.98 0.89  CALCIUM 8.8* 8.7* 8.9 8.9  MG  --  1.9 2.3 2.5*   GFR: Estimated Creatinine Clearance: 61.4 mL/min (by C-G formula based on SCr of 0.89 mg/dL). Liver Function Tests: Recent Labs  Lab 06/19/22 2155  AST 54*  ALT 28  ALKPHOS 86  BILITOT 1.1  PROT 8.1  ALBUMIN  4.3   No results for input(s): "LIPASE", "AMYLASE" in the last 168 hours. No results for input(s): "AMMONIA" in the last 168 hours. Coagulation Profile: No results for input(s): "INR", "PROTIME" in the last 168 hours. Cardiac Enzymes: No results for input(s): "CKTOTAL", "CKMB", "CKMBINDEX", "TROPONINI" in the last 168 hours. BNP (last 3 results) No results for input(s): "PROBNP" in the last 8760 hours. HbA1C: No results for input(s): "HGBA1C" in the last 72 hours. CBG: No results for input(s): "GLUCAP" in the last 168 hours. Lipid Profile: No results for input(s): "CHOL", "HDL", "LDLCALC", "TRIG", "CHOLHDL", "LDLDIRECT" in the last 72 hours. Thyroid Function Tests: No results for input(s): "TSH", "T4TOTAL", "FREET4", "T3FREE", "THYROIDAB" in the last 72 hours. Anemia Panel: No results for input(s): "VITAMINB12", "FOLATE", "FERRITIN", "TIBC", "IRON", "RETICCTPCT" in the last 72 hours. Sepsis Labs: Recent Labs  Lab 06/20/22 0421  PROCALCITON 1.04    Recent Results (from the past 240 hour(s))  Resp panel by RT-PCR (RSV, Flu A&B, Covid) Anterior Nasal Swab     Status:  None   Collection Time: 06/20/22 12:57 AM   Specimen: Anterior Nasal Swab  Result Value Ref Range Status   SARS Coronavirus 2 by RT PCR NEGATIVE NEGATIVE Final    Comment: (NOTE) SARS-CoV-2 target nucleic acids are NOT DETECTED.  The SARS-CoV-2 RNA is generally detectable in upper respiratory specimens during the acute phase of infection. The lowest concentration of SARS-CoV-2 viral copies this assay can detect is 138 copies/mL. A negative result does not preclude SARS-Cov-2 infection and should not be used as the sole basis for treatment or other patient management decisions. A negative result may occur with  improper specimen collection/handling, submission of specimen other than nasopharyngeal swab, presence of viral mutation(s) within the areas targeted by this assay, and inadequate number of  viral copies(<138 copies/mL). A negative result must be combined with clinical observations, patient history, and epidemiological information. The expected result is Negative.  Fact Sheet for Patients:  EntrepreneurPulse.com.au  Fact Sheet for Healthcare Providers:  IncredibleEmployment.be  This test is no t yet approved or cleared by the [place] FDA and  has been authorized for detection and/or diagnosis of SARS-CoV-2 by FDA under an Emergency Use Authorization (EUA). This EUA will remain  in effect (meaning this test can be used) for the duration of the COVID-19 declaration under Section 564(b)(1) of the Act, 21 U.S.C.section 360bbb-3(b)(1), unless the authorization is terminated  or revoked sooner.       Influenza A by PCR NEGATIVE NEGATIVE Final   Influenza B by PCR NEGATIVE NEGATIVE Final    Comment: (NOTE) The Xpert Xpress SARS-CoV-2/FLU/RSV plus assay is intended as an aid in the diagnosis of influenza from Nasopharyngeal swab specimens and should not be used as a sole basis for treatment. Nasal washings and aspirates are unacceptable for Xpert Xpress SARS-CoV-2/FLU/RSV testing.  Fact Sheet for Patients: EntrepreneurPulse.com.au  Fact Sheet for Healthcare Providers: IncredibleEmployment.be  This test is not yet approved or cleared by the [place] FDA and has been authorized for detection and/or diagnosis of SARS-CoV-2 by FDA under an Emergency Use Authorization (EUA). This EUA will remain in effect (meaning this test can be used) for the duration of the COVID-19 declaration under Section 564(b)(1) of the Act, 21 U.S.C. section 360bbb-3(b)(1), unless the authorization is terminated or revoked.     Resp Syncytial Virus by PCR NEGATIVE NEGATIVE Final    Comment: (NOTE) Fact Sheet for Patients: EntrepreneurPulse.com.au  Fact Sheet for Healthcare  Providers: IncredibleEmployment.be  This test is not yet approved or cleared by the [place] FDA and has been authorized for detection and/or diagnosis of SARS-CoV-2 by FDA under an Emergency Use Authorization (EUA). This EUA will remain in effect (meaning this test can be used) for the duration of the COVID-19 declaration under Section 564(b)(1) of the Act, 21 U.S.C. section 360bbb-3(b)(1), unless the authorization is terminated or revoked.  Performed at [facility], [place] [address], [place], [place] [address]   Respiratory (~20 pathogens) panel by PCR     Status: Abnormal   Collection Time: 06/20/22  5:00 AM   Specimen: Nasopharyngeal Swab; Respiratory  Result Value Ref Range Status   Adenovirus NOT DETECTED NOT DETECTED Final   Coronavirus 229E NOT DETECTED NOT DETECTED Final    Comment: (NOTE) The Coronavirus on the Respiratory Panel, DOES NOT test for the novel  Coronavirus (2019 nCoV)    Coronavirus HKU1 NOT DETECTED NOT DETECTED Final   Coronavirus NL63 NOT DETECTED NOT DETECTED Final   Coronavirus OC43 NOT DETECTED NOT  DETECTED Final   Metapneumovirus DETECTED (A) NOT DETECTED Final   Rhinovirus / Enterovirus NOT DETECTED NOT DETECTED Final   Influenza A NOT DETECTED NOT DETECTED Final   Influenza B NOT DETECTED NOT DETECTED Final   Parainfluenza Virus 1 NOT DETECTED NOT DETECTED Final   Parainfluenza Virus 2 NOT DETECTED NOT DETECTED Final   Parainfluenza Virus 3 NOT DETECTED NOT DETECTED Final   Parainfluenza Virus 4 NOT DETECTED NOT DETECTED Final   Respiratory Syncytial Virus NOT DETECTED NOT DETECTED Final   Bordetella pertussis NOT DETECTED NOT DETECTED Final   Bordetella Parapertussis NOT DETECTED NOT DETECTED Final   Chlamydophila pneumoniae NOT DETECTED NOT DETECTED Final   Mycoplasma pneumoniae NOT DETECTED NOT DETECTED Final    Comment: Performed at [facility], [place] [address] [place], [place] [address]          Radiology Studies: No results found.      Scheduled Meds:  amLODipine  5 mg Oral Daily   arformoterol  15 mcg Nebulization BID   atorvastatin  10 mg Oral Daily   budesonide (PULMICORT) nebulizer solution  0.5 mg Nebulization BID   enoxaparin (LOVENOX) injection  40 mg Subcutaneous [id number]   folic acid  1 mg Oral Daily   ipratropium-albuterol  3 mL Nebulization BID   methylPREDNISolone (SOLU-MEDROL) injection  40 mg Intravenous Q12H   mirtazapine  15 mg Oral QHS   multivitamin with minerals  1 tablet Oral Daily   nicotine  7 mg Transdermal Daily   pantoprazole  40 mg Oral Daily   thiamine  100 mg Oral Daily   Continuous Infusions:  azithromycin (ZITHROMAX) 500 mg in sodium chloride 0.9 % 250 mL IVPB Stopped (06/21/22 2238)   cefTRIAXone (ROCEPHIN)  IV Stopped (06/21/22 2350)     LOS: 2 days    Time spent: 52 minutes spent on chart review, discussion with nursing staff, consultants, updating family and interview/physical exam; more than 50% of that time was spent in counseling and/or coordination of care.    Geradine Girt, DO [facility] Available via Epic secure chat 7am-7pm After these hours, please refer to coverage provider listed on [email or web address] 06/22/2022, 11:06 AM

## 2022-06-22 NOTE — Progress Notes (Signed)
Physical Therapy Treatment Patient Details Name: Jason Wong MRN: [id number] DOB: [birth year] Today's Date: 06/22/2022   History of Present Illness 67 yo male admitted to hospital on 06/19/2022 due to progressive SOB and wheezing over the past 2 days requiring pt to utilize in home supplemental O2, and reporting no relief from inhalers. At presentation to ED O2 saturation in the 80s requiring supplemental O2, CTA negative for PE but showing opacities in RUL and B bases as well as severe emphysema. Pt admitted with dx of acute respiratory failure with hypoxia secondary to COPD with acute exacerbation with possible PNA. Pt has PMH including but not limited to: COPD, emphysema, tobacco abuse, hep C, HTN, and HDL.    PT Comments     Pt admitted with above diagnosis.  Pt currently with functional limitations due to the deficits listed below (see PT Problem List). Pt standing in bathroom holding onto IV pole when PT arrived. Pt states he gets up sometimes to go to the bathroom and does not ask for assist. Pt had [place] tube donned however did not appear as if supplemental O2 turned on with flow rate of 0. PT doffed [place] tube once pt seated EOB and monitored pt recovery with O2 saturation remaining > 92% on RA. Pt requires cues for pursed lip breathing and slowed RR and is able to perform return demonstration with cues. Unproductive cough intermittently. Pt required S and cues for safety and tube management for transfer tasks.  Pt agreeable to gait training in hallway, use of IV pole and Min guard with cues for safety with turns short stride length for 100 feet. Pt indicated SOB with pt desaturating  to 86% on RA and recovery to 90% on RA in < 20s with pursed lip breathing. Pt elected to remain seated EOB and all needs met.  Pt will benefit from acute skilled PT to increase their independence and safety with mobility to allow discharge.     Recommendations for follow up therapy are one component of a multi-disciplinary  discharge planning process, led by the attending physician.  Recommendations may be updated based on patient status, additional functional criteria and insurance authorization.  Follow Up Recommendations       Assistance Recommended at Discharge Intermittent Supervision/Assistance  Patient can return home with the following A little help with walking and/or transfers;A little help with bathing/dressing/bathroom;Assistance with cooking/housework;Assist for transportation;Help with stairs or ramp for entrance   Equipment Recommendations  None recommended by PT (at this time, pending pt progression)    Recommendations for Other Services       Precautions / Restrictions Precautions Precautions: Fall;Other (comment) (monitor O2) Restrictions Weight Bearing Restrictions: No     Mobility  Bed Mobility Overal bed mobility: Modified Independent                  Transfers Overall transfer level: Needs assistance Equipment used: None Transfers: Sit to/from Stand Sit to Stand: Supervision (to asist in management of IV and [place] tube)           General transfer comment: slight posterior lean with initial standing with wide BOS    Ambulation/Gait Ambulation/Gait assistance: Min guard Gait Distance (Feet): 100 Feet Assistive device: IV Pole Gait Pattern/deviations: Step-to pattern Gait velocity: decreased     General Gait Details: O2 assessed t/o PT intervention and pt on RA with desaturation to 86% and pt recovered seated EOB < 20s to 90% on RA with cues or pursed lip breathing   Stairs  Wheelchair Mobility    Modified Rankin (Stroke Patients Only)       Balance Overall balance assessment: Needs assistance Sitting-balance support: Feet supported Sitting balance-Leahy Scale: Good     Standing balance support: No upper extremity supported Standing balance-Leahy Scale: Fair Standing balance comment: posterior lean, wide BOS                             Cognition Arousal/Alertness: Awake/alert Behavior During Therapy: WFL for tasks assessed/performed Overall Cognitive Status: Within Functional Limits for tasks assessed                                          Exercises      General Comments        Pertinent Vitals/Pain      Home Living                          Prior Function            PT Goals (current goals can now be found in the care plan section) Acute Rehab PT Goals Patient Stated Goal: hang out with the guys on the block PT Goal Formulation: With patient Time For Goal Achievement: 06/20/22 Potential to Achieve Goals: Good Progress towards PT goals: Progressing toward goals    Frequency    Min 3X/week      PT Plan Current plan remains appropriate    Co-evaluation              AM-PAC PT "6 Clicks" Mobility   Outcome Measure  Help needed turning from your back to your side while in a flat bed without using bedrails?: None Help needed moving from lying on your back to sitting on the side of a flat bed without using bedrails?: None Help needed moving to and from a bed to a chair (including a wheelchair)?: A Little Help needed standing up from a chair using your arms (e.g., wheelchair or bedside chair)?: A Little Help needed to walk in hospital room?: A Little Help needed climbing 3-5 steps with a railing? : A Lot 6 Click Score: 19    End of Session Equipment Utilized During Treatment: Gait belt Activity Tolerance: Patient limited by fatigue Patient left: with call bell/phone within reach;with bed alarm set Nurse Communication: Mobility status;Other (comment) (O2 saturation on RA) PT Visit Diagnosis: Unsteadiness on feet (R26.81);Other abnormalities of gait and mobility (R26.89);Muscle weakness (generalized) (M62.81)     Time: [id number] PT Time Calculation (min) (ACUTE ONLY): 29 min  Charges:  $Gait Training: 8-22 mins $Therapeutic Activity:  8-22 mins                    Baird Lyons, PT    Adair Patter 06/22/2022, 12:45 PM

## 2022-06-22 NOTE — TOC Initial Note (Signed)
Transition of Care [facility]) - Initial/Assessment Note    Patient Details  Name: Jason Wong MRN: [id number] Date of Birth: 1955-07-08  [facility] CM/SW Contact:    Illene Regulus, LCSW Phone Number: 06/22/2022, 12:50 PM  Clinical Narrative:                 CSW spoke with pt about recommendation for home health services. Pt agrees , and has not preferences in agency. TOC to follow.  Expected Discharge Plan: [place] Barriers to Discharge: Continued Medical Work up   Patient Goals and CMS Choice Patient states their goals for this hospitalization and ongoing recovery are:: retrun home CMS Medicare.gov Compare Post Acute Care list provided to:: Patient Choice offered to / list presented to : Patient      Expected Discharge Plan and Services       Living arrangements for the past 2 months: Single Family Home                                      Prior Living Arrangements/Services Living arrangements for the past 2 months: Single Family Home Lives with:: Self Patient language and need for interpreter reviewed:: Yes Do you feel safe going back to the place where you live?: Yes      Need for Family Participation in Patient Care: No (Comment) Care giver support system in place?: No (comment)   Criminal Activity/Legal Involvement Pertinent to Current Situation/Hospitalization: No - Comment as needed  Activities of Daily Living Home Assistive Devices/Equipment: Cane (specify quad or straight) ADL Screening (condition at time of admission) Patient's cognitive ability adequate to safely complete daily activities?: Yes Is the patient deaf or have difficulty hearing?: No Does the patient have difficulty seeing, even when wearing glasses/contacts?: No Does the patient have difficulty concentrating, remembering, or making decisions?: No Patient able to express need for assistance with ADLs?: Yes Does the patient have difficulty dressing  or bathing?: No Independently performs ADLs?: No Communication: Independent Dressing (OT): Needs assistance Is this a change from baseline?: Change from baseline, expected to last <3days Grooming: Independent Feeding: Independent Bathing: Needs assistance Is this a change from baseline?: Change from baseline, expected to last <3 days Toileting: Needs assistance Is this a change from baseline?: Change from baseline, expected to last <3 days In/Out Bed: Needs assistance Is this a change from baseline?: Change from baseline, expected to last <3 days Walks in Home: Independent with device (comment) Does the patient have difficulty walking or climbing stairs?: Yes Weakness of Legs: Left Weakness of Arms/Hands: None  Permission Sought/Granted                  Emotional Assessment Appearance:: Appears stated age Attitude/Demeanor/Rapport: Gracious Affect (typically observed): Accepting Orientation: : Oriented to Self, Oriented to Place, Oriented to  Time, Oriented to Situation      Admission diagnosis:  Hypoxia [R09.02] COPD exacerbation [J44.1] COPD with acute exacerbation [J44.1] Multifocal pneumonia [J18.9] Acute respiratory failure with hypoxia [J96.01] Patient Active Problem List   Diagnosis Date Noted   COPD with acute exacerbation 06/20/2022   Multifocal pneumonia 06/20/2022   Hypokalemia 06/20/2022   Acute respiratory failure with hypoxia 06/20/2022   Polysubstance abuse 05/09/2022   On home oxygen therapy 08/31/2021   Malnutrition of moderate degree 06/16/2021   Acute exacerbation of chronic obstructive pulmonary disease (COPD) 06/14/2021   ETOH abuse 05/01/2021  Acute hypoxemic respiratory failure 04/30/2021   HLD (hyperlipidemia) 04/30/2021   COPD (chronic obstructive pulmonary disease) with emphysema 02/19/2021   COPD exacerbation 02/18/2021   Memory deficit 04/28/2020   Hepatitis C virus infection cured after antiviral drug therapy 04/27/2018   Enuresis  04/27/2018   Centrilobular emphysema 04/27/2018   Seasonal allergic rhinitis 04/27/2018   Tobacco dependence 08/24/2017   Weight loss, unintentional 08/24/2017   Insomnia 06/27/2017   Prediabetes 01/24/2017   Asthma 06/10/2016   Essential hypertension 06/10/2016   Current every day smoker 06/10/2016   PCP:  Ladell Pier, MD Pharmacy:   [facility] [address] [place] [address] Phone: [phone] Fax: [phone]     Social Determinants of Health (SDOH) Social History: SDOH Screenings   Food Insecurity: No Food Insecurity (06/20/2022)  Housing: Low Risk  (06/20/2022)  Transportation Needs: No Transportation Needs (06/20/2022)  Utilities: Not At Risk (06/20/2022)  Depression (PHQ2-9): Low Risk  (05/09/2022)  Tobacco Use: Medium Risk (06/20/2022)   SDOH Interventions:     Readmission Risk Interventions    06/21/2021    2:22 PM  Readmission Risk Prevention Plan  Transportation Screening Complete  PCP or Specialist Appt within 5-7 Days Complete  Home Care Screening Complete  Medication Review (RN CM) Complete

## 2022-06-23 ENCOUNTER — Other Ambulatory Visit (HOSPITAL_COMMUNITY): Payer: Self-pay

## 2022-06-23 DIAGNOSIS — J441 Chronic obstructive pulmonary disease with (acute) exacerbation: Secondary | ICD-10-CM | POA: Diagnosis not present

## 2022-06-23 MED ORDER — NICOTINE 7 MG/24HR TD PT24
7.0000 mg | MEDICATED_PATCH | Freq: Every day | TRANSDERMAL | 0 refills | Status: DC
  Filled 2022-06-23: qty 28, 28d supply, fill #0

## 2022-06-23 MED ORDER — VITAMIN B-1 100 MG PO TABS
100.0000 mg | ORAL_TABLET | Freq: Every day | ORAL | 0 refills | Status: DC
  Filled 2022-06-23: qty 100, 100d supply, fill #0

## 2022-06-23 MED ORDER — PREDNISONE 20 MG PO TABS
40.0000 mg | ORAL_TABLET | Freq: Every day | ORAL | 0 refills | Status: DC
  Filled 2022-06-23: qty 6, 3d supply, fill #0

## 2022-06-23 NOTE — Progress Notes (Signed)
SATURATION QUALIFICATIONS: (This note is used to comply with regulatory documentation for home oxygen)  Patient Saturations on Room Air at Rest = 93%  Patient Saturations on Room Air while Ambulating = 89%  Patient Saturations on 2 Liters of oxygen while Ambulating = 94%  Please briefly explain why patient needs home oxygen: maintain safe O@ saturation

## 2022-06-23 NOTE — Progress Notes (Signed)
PT demonstrates hands on understanding of Flutter device- NPC at this time. 

## 2022-06-23 NOTE — Care Management Important Message (Signed)
Important Message  Patient Details IM Letter given. Name: Jason Wong MRN: [id number] Date of Birth: 1955/06/08   Medicare Important Message Given:  Yes     Kerin Salen 06/23/2022, 11:27 AM

## 2022-06-23 NOTE — TOC Transition Note (Addendum)
Transition of Care [facility]) - CM/SW Discharge Note   Patient Details  Name: Jason Wong MRN: [id number] Date of Birth: 23-Oct-1955  Transition of Care [facility]) CM/SW Contact:  Illene Regulus, LCSW Phone Number: 06/23/2022, 10:10 AM   Clinical Narrative:    CSW spoke with pt regarding home O2 , pt reported conflicting answers, CSW receive permission to speak with pt's nephew. Pt's nephew reported pt does not have O2 at home . He reports there are two half empty tanks that belonged to pt's siblings that passed away. He stated not knowing the DME company that provided pt's sibling O2. TOC to follow.   Pt does not qualify for home O2, pt reported needing transportation assistance home. CSW provided pt with a bus pass. Pt reported needing clothing as well. CSW was only able to find a T-shirt , shirt was provided . TOC sign off.  Final next level of care: Home w Home Health Services Barriers to Discharge: No Barriers Identified   Patient Goals and CMS Choice CMS Medicare.gov Compare Post Acute Care list provided to:: Patient Choice offered to / list presented to : Patient  Discharge Placement                    Name of family member notified: [name], [name]  [phone] Patient and family notified of of transfer: 06/23/22  Discharge Plan and Services Additional resources added to the After Visit Summary for                            [facility] Arranged: RN, PT [facility] Agency: [place] Date [facility] Agency Contacted: 06/22/22 Time HH Agency Contacted: 1300 Representative spoke with at [place]: cheryl  Social Determinants of Health ([place]) Interventions SDOH Screenings   Food Insecurity: No Food Insecurity (06/20/2022)  Housing: Low Risk  (06/20/2022)  Transportation Needs: No Transportation Needs (06/20/2022)  Utilities: Not At Risk (06/20/2022)  Depression (PHQ2-9): Low Risk  (05/09/2022)  Tobacco Use: Medium Risk (06/20/2022)     Readmission Risk  Interventions    06/21/2021    2:22 PM  Readmission Risk Prevention Plan  Transportation Screening Complete  PCP or Specialist Appt within 5-7 Days Complete  Home Care Screening Complete  Medication Review (RN CM) Complete

## 2022-06-23 NOTE — Discharge Summary (Signed)
Physician Discharge Summary  Jason Wong [id number] DOB: Aug 11, 1955 DOA: 06/19/2022  PCP: Ladell Pier, MD  Admit date: 06/19/2022 Discharge date: 06/23/2022  Admitted From: home Discharge disposition: home   Recommendations for Outpatient Follow-Up:   Home health Did not qualify for home O2 Encourage tobacco cessation   Discharge Diagnosis:   Principal Problem:   COPD with acute exacerbation Active Problems:   Essential hypertension   Acute hypoxemic respiratory failure   HLD (hyperlipidemia)   Multifocal pneumonia   Hypokalemia   Acute respiratory failure with hypoxia    Discharge Condition: Improved.  Diet recommendation: Low sodium, heart healthy  Wound care: None.  Code status: Full.   History of Present Illness:   Jason Wong is a 67 y.o. 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  He was given DuoNeb and Solu-Medrol by EMS.  He was tachycardic and significantly tachypneic on arrival to the ED.  Afebrile.  Oxygen saturation in the 80s and placed on supplemental oxygen.  Labs showing no leukocytosis, hemoglobin 12.9 (stable), potassium 3.1, bicarb 19 (slightly low on previous labs as well), anion gap 14, glucose 155, creatinine 1.2 (baseline 1.0), AST 54 (slightly elevated on labs done a month ago as well), ALT normal, alk phos and T. bili normal, troponin negative x 2, BNP 116, D-dimer 3.06, VBG with pH 7.52 and pCO2 27, COVID/influenza/RSV PCR negative.  CTA chest negative for PE but showing mild airspace opacities in the posterior aspect of the right upper lobe and lung bases bilaterally.  Showing severe emphysema. Patient received DuoNeb, ceftriaxone, and azithromycin.  TRH called to admit.     Hospital Course by Problem:   Acute hypoxic respiratory failure  with hypoxia; POA Patient presenting to ED with progressive shortness of breath, wheezing and nonproductive cough.  Etiology likely multifactorial in setting of acute COPD exacerbation, metapneumovirus viral infection and community-acquired pneumonia. -- finish treatment   Acute COPD exacerbation Patient presenting with progressive shortness of breath, wheezing and nonproductive cough in the setting of continued intermittent use of tobacco.  Patient has oxygen at home to use as needed but typically at baseline does not require.  Was noted to be hypoxic on admission with SpO2 86% on room air. -- Pulmicort neb twice daily -- Brovana neb twice daily -- Solu-Medrol 40 mg IV q12h-- changed to PO in the AM -- duonebs q6h -- Albuterol neb every 4 hours as needed wheezing/shortness of breath -- did not qualify for O2   Community-acquired pneumonia Chest x-ray and CT angiogram chest notable for consolidation right upper lobe and lower lobes bilaterally. -- finished abx treatment   Metapneumovirus viral infection Respiratory viral panel positive for metapneumovirus -- Supportive care, contact precaution   Hypokalemia: Resolved Repleted.    Essential hypertension -- Amlodipine 5 mg p.o. daily   Hyperlipidemia -- Atorvastatin 10 mg p.o. daily   History of substance/cocaine abuse History of cocaine abuse, UDS positive on 06/14/2021.  Counseled on need for complete cessation/abstinence.   EtOH abuse Counseled on need for complete cessation. -- CIWA protocol with symptom triggered Ativan -- Multivitamin, folate, thiamine   Tobacco use disorder Patient reports quit smoking 5 to 6 months ago; although does endorse occasional relapses.  Counseled on need for complete cessation/abstinence. -- Nicotine patch   Weakness/debility/deconditioning: Seen by PT with recommendation of home health on discharge. --  Continue therapy efforts while inpatient      Medical Consultants:       Discharge Exam:   Vitals:   06/23/22 0643 06/23/22 0926  BP: (!) 141/95   Pulse: 67   Resp: 20   Temp: 97.6 F (36.4 C)   SpO2: 97% 96%   Vitals:   06/23/22 0223 06/23/22 0625 06/23/22 0643 06/23/22 0926  BP: (!) 132/101 (!) 143/113 (!) 141/95   Pulse: 77 89 67   Resp: 18 20 20    Temp: (!) 97.2 F (36.2 C) 97.8 F (36.6 C) 97.6 F (36.4 C)   TempSrc: Oral Oral    SpO2: 97% 92% 97% 96%  Weight:      Height:        General exam: Appears calm and comfortable.    The results of significant diagnostics from this hospitalization (including imaging, microbiology, ancillary and laboratory) are listed below for reference.     Procedures and Diagnostic Studies:   CT Angio Chest PE W and/or Wo Contrast  Result Date: 06/20/2022 CLINICAL DATA:  Pulmonary embolism suspected, high probability. Shortness of breath. History of COPD. EXAM: CT ANGIOGRAPHY CHEST WITH CONTRAST TECHNIQUE: Multidetector CT imaging of the chest was performed using the standard protocol during bolus administration of intravenous contrast. Multiplanar CT image reconstructions and MIPs were obtained to evaluate the vascular anatomy. RADIATION DOSE REDUCTION: This exam was performed according to the departmental dose-optimization program which includes automated exposure control, adjustment of the mA and/or kV according to patient size and/or use of iterative reconstruction technique. CONTRAST:  13mL OMNIPAQUE IOHEXOL 350 MG/ML SOLN COMPARISON:  08/12/2021. FINDINGS: Cardiovascular: The heart is normal in size and there is no pericardial effusion. There is atherosclerotic calcification of the aorta without evidence of aneurysm. The pulmonary trunk is normal in caliber. No evidence of pulmonary embolism. Evaluation of the pulmonary arteries is limited due to respiratory motion artifact. Mediastinum/Nodes: No enlarged mediastinal, hilar, or axillary lymph nodes. Thyroid gland, trachea, and esophagus demonstrate no  significant findings. Lungs/Pleura: Advanced emphysematous changes are present in the lungs. Airspace opacities are noted in the posterior aspect of the right upper lobe and lung bases bilaterally, greater on the right than on the left. No effusion or pneumothorax. Upper Abdomen: No acute abnormality. Musculoskeletal: No acute osseous abnormality. Review of the MIP images confirms the above findings. IMPRESSION: 1. No evidence of pulmonary embolism. 2. Mild airspace opacities in the posterior aspect of the right upper lobe and lower lobes bilaterally, possible atelectasis, edema, or infiltrate. 3. Severe emphysema. 4. Aortic atherosclerosis. Electronically Signed   By: Brett Fairy M.D.   On: 06/20/2022 00:04   DG Chest Port 1 View  Result Date: 06/19/2022 CLINICAL DATA:  Cough, shortness of breath EXAM: PORTABLE CHEST 1 VIEW COMPARISON:  09/02/2021 FINDINGS: Patchy airspace opacities in the right upper lobe and right lower lobe concerning for pneumonia. Left lung clear. Heart is normal size. No effusions or acute bony abnormality. IMPRESSION: Patchy airspace disease in the right upper lobe and right lower lobe concerning for pneumonia. Electronically Signed   By: Rolm Baptise M.D.   On: 06/19/2022 21:38     Labs:   Basic Metabolic Panel: Recent Labs  Lab 06/19/22 2155 06/20/22 0421 06/21/22 0421 06/22/22 0444  NA 139 138 141 140  K 3.1* 3.0* 4.3 4.3  CL 106 106 112* 110  CO2 19* 20* 18* 21*  GLUCOSE 155* 176* 133* 130*  BUN 11 12 19 19   CREATININE 1.26* 1.09 0.98 0.89  CALCIUM 8.8* 8.7* 8.9 8.9  MG  --  1.9 2.3 2.5*   GFR Estimated Creatinine Clearance: 61.4 mL/min (by C-G formula based on SCr of 0.89 mg/dL). Liver Function Tests: Recent Labs  Lab 06/19/22 2155  AST 54*  ALT 28  ALKPHOS 86  BILITOT 1.1  PROT 8.1  ALBUMIN 4.3   No results for input(s): "LIPASE", "AMYLASE" in the last 168 hours. No results for input(s): "AMMONIA" in the last 168 hours. Coagulation  profile No results for input(s): "INR", "PROTIME" in the last 168 hours.  CBC: Recent Labs  Lab 06/19/22 2155 06/20/22 0421  WBC 8.8 9.9  NEUTROABS 7.3  --   HGB 12.9* 12.3*  HCT 38.5* 37.1*  MCV 97.7 97.9  PLT 248 239   Cardiac Enzymes: No results for input(s): "CKTOTAL", "CKMB", "CKMBINDEX", "TROPONINI" in the last 168 hours. BNP: Invalid input(s): "POCBNP" CBG: No results for input(s): "GLUCAP" in the last 168 hours. D-Dimer No results for input(s): "DDIMER" in the last 72 hours. Hgb A1c No results for input(s): "HGBA1C" in the last 72 hours. Lipid Profile No results for input(s): "CHOL", "HDL", "LDLCALC", "TRIG", "CHOLHDL", "LDLDIRECT" in the last 72 hours. Thyroid function studies No results for input(s): "TSH", "T4TOTAL", "T3FREE", "THYROIDAB" in the last 72 hours.  Invalid input(s): "FREET3" Anemia work up No results for input(s): "VITAMINB12", "FOLATE", "FERRITIN", "TIBC", "IRON", "RETICCTPCT" in the last 72 hours. Microbiology Recent Results (from the past 240 hour(s))  Resp panel by RT-PCR (RSV, Flu A&B, Covid) Anterior Nasal Swab     Status: None   Collection Time: 06/20/22 12:57 AM   Specimen: Anterior Nasal Swab  Result Value Ref Range Status   SARS Coronavirus 2 by RT PCR NEGATIVE NEGATIVE Final    Comment: (NOTE) SARS-CoV-2 target nucleic acids are NOT DETECTED.  The SARS-CoV-2 RNA is generally detectable in upper respiratory specimens during the acute phase of infection. The lowest concentration of SARS-CoV-2 viral copies this assay can detect is 138 copies/mL. A negative result does not preclude SARS-Cov-2 infection and should not be used as the sole basis for treatment or other patient management decisions. A negative result may occur with  improper specimen collection/handling, submission of specimen other than nasopharyngeal swab, presence of viral mutation(s) within the areas targeted by this assay, and inadequate number of viral copies(<138  copies/mL). A negative result must be combined with clinical observations, patient history, and epidemiological information. The expected result is Negative.  Fact Sheet for Patients:  EntrepreneurPulse.com.au  Fact Sheet for Healthcare Providers:  IncredibleEmployment.be  This test is no t yet approved or cleared by the [place] FDA and  has been authorized for detection and/or diagnosis of SARS-CoV-2 by FDA under an Emergency Use Authorization (EUA). This EUA will remain  in effect (meaning this test can be used) for the duration of the COVID-19 declaration under Section 564(b)(1) of the Act, 21 U.S.C.section 360bbb-3(b)(1), unless the authorization is terminated  or revoked sooner.       Influenza A by PCR NEGATIVE NEGATIVE Final   Influenza B by PCR NEGATIVE NEGATIVE Final    Comment: (NOTE) The Xpert Xpress SARS-CoV-2/FLU/RSV plus assay is intended as an aid in the diagnosis of influenza from Nasopharyngeal swab specimens and should not be used as a sole basis for treatment. Nasal washings and aspirates are unacceptable for Xpert Xpress SARS-CoV-2/FLU/RSV testing.  Fact Sheet for Patients: EntrepreneurPulse.com.au  Fact Sheet for Healthcare Providers: IncredibleEmployment.be  This test is not yet approved or cleared by the [place]  FDA and has been authorized for detection and/or diagnosis of SARS-CoV-2 by FDA under an Emergency Use Authorization (EUA). This EUA will remain in effect (meaning this test can be used) for the duration of the COVID-19 declaration under Section 564(b)(1) of the Act, 21 U.S.C. section 360bbb-3(b)(1), unless the authorization is terminated or revoked.     Resp Syncytial Virus by PCR NEGATIVE NEGATIVE Final    Comment: (NOTE) Fact Sheet for Patients: EntrepreneurPulse.com.au  Fact Sheet for Healthcare  Providers: IncredibleEmployment.be  This test is not yet approved or cleared by the [place] FDA and has been authorized for detection and/or diagnosis of SARS-CoV-2 by FDA under an Emergency Use Authorization (EUA). This EUA will remain in effect (meaning this test can be used) for the duration of the COVID-19 declaration under Section 564(b)(1) of the Act, 21 U.S.C. section 360bbb-3(b)(1), unless the authorization is terminated or revoked.  Performed at [facility], [place] [address], [place], [place] [address]   Respiratory (~20 pathogens) panel by PCR     Status: Abnormal   Collection Time: 06/20/22  5:00 AM   Specimen: Nasopharyngeal Swab; Respiratory  Result Value Ref Range Status   Adenovirus NOT DETECTED NOT DETECTED Final   Coronavirus 229E NOT DETECTED NOT DETECTED Final    Comment: (NOTE) The Coronavirus on the Respiratory Panel, DOES NOT test for the novel  Coronavirus (2019 nCoV)    Coronavirus HKU1 NOT DETECTED NOT DETECTED Final   Coronavirus NL63 NOT DETECTED NOT DETECTED Final   Coronavirus OC43 NOT DETECTED NOT DETECTED Final   Metapneumovirus DETECTED (A) NOT DETECTED Final   Rhinovirus / Enterovirus NOT DETECTED NOT DETECTED Final   Influenza A NOT DETECTED NOT DETECTED Final   Influenza B NOT DETECTED NOT DETECTED Final   Parainfluenza Virus 1 NOT DETECTED NOT DETECTED Final   Parainfluenza Virus 2 NOT DETECTED NOT DETECTED Final   Parainfluenza Virus 3 NOT DETECTED NOT DETECTED Final   Parainfluenza Virus 4 NOT DETECTED NOT DETECTED Final   Respiratory Syncytial Virus NOT DETECTED NOT DETECTED Final   Bordetella pertussis NOT DETECTED NOT DETECTED Final   Bordetella Parapertussis NOT DETECTED NOT DETECTED Final   Chlamydophila pneumoniae NOT DETECTED NOT DETECTED Final   Mycoplasma pneumoniae NOT DETECTED NOT DETECTED Final    Comment: Performed at [facility], [address] [place],  [address]      Discharge Instructions:   Discharge Instructions     Diet - low sodium heart healthy   Complete by: As directed    Increase activity slowly   Complete by: As directed       Allergies as of 06/23/2022       Reactions   Tobacco Shortness Of Breath, Other (See Comments)   CIGARETTE SMOKE TRIGGERS THE PATIENT'S RESPIRATORY ISSUES!!        Medication List     TAKE these medications    acetaminophen 500 MG tablet Commonly known as: TYLENOL Take 500 mg by mouth every 6 (six) hours as needed for moderate pain.   albuterol 108 (90 Base) MCG/ACT inhaler Commonly known as: ProAir HFA Inhale 2 puffs into the lungs once every 4 (four) hours as needed for wheezing or shortness of breath.   amLODipine 5 MG tablet Commonly known as: NORVASC Take 1 tablet (5 mg total) by mouth daily.   atorvastatin 10 MG tablet Commonly known as: LIPITOR Take 1 tablet (10 mg total) by mouth daily.   ipratropium-albuterol 0.5-2.5 (3) MG/3ML Soln Commonly known  as: DUONEB Take 3 mLs by nebulization every 6 (six) hours as needed (Shortness of breath or wheezing).   mirtazapine 15 MG tablet Commonly known as: REMERON Take 0.5 tablets (7.5 mg total) by mouth at bedtime. What changed: how much to take   nicotine 7 mg/24hr patch Commonly known as: NICODERM CQ - dosed in mg/24 hr Place 1 patch (7 mg total) onto the skin daily. Start taking on: June 24, 2022   pantoprazole 40 MG tablet Commonly known as: PROTONIX Take 1 tablet (40 mg total) by mouth daily at 6 (six) AM.   predniSONE 20 MG tablet Commonly known as: DELTASONE Take 2 tablets (40 mg total) by mouth daily with breakfast for 3 days. Start taking on: June 24, 2022   thiamine 100 MG tablet Commonly known as: Vitamin B-1 Take 1 tablet (100 mg total) by mouth daily. Start taking on: June 24, 2022   Trelegy Ellipta 200-62.5-25 MCG/ACT Aepb Generic drug: Fluticasone-Umeclidin-Vilant Inhale 1 puff into the lungs daily.         Follow-up Information     [facility] [place] Follow up.   Why: Home Health RN and PT will follow up 24 to 48hrs after discharge Contact information: [place] [address] [phone]                  Time coordinating discharge: 45 min  Signed:  Geradine Girt DO  [facility] 06/23/2022, 9:52 AM

## 2022-06-24 ENCOUNTER — Telehealth: Payer: Self-pay

## 2022-06-24 NOTE — Transitions of Care (Post Inpatient/ED Visit) (Signed)
   06/24/2022  Name: Jason Wong MRN: [id number] DOB: 09/21/1955  Today's TOC FU Call Status: Today's TOC FU Call Status:: Successful TOC FU Call Competed TOC FU Call Complete Date: 06/24/22  Transition Care Management Follow-up Telephone Call Date of Discharge: 06/23/22 Discharge Facility: [facility]) Type of Discharge: Inpatient Admission Primary Inpatient Discharge Diagnosis:: "COPD exacerbation" How have you been since you were released from the hospital?: Same (patient states he is still having dry coughing spells at times, occasional SOB if he "overdoes it.") Any questions or concerns?: No  Items Reviewed: Did you receive and understand the discharge instructions provided?: Yes Medications obtained and verified?: Yes (Medications Reviewed) (patient states he has not picked up new meds yet-will go pick them up today, needs a refill on inhaler-will call pharmacy as he has refills remaining) Any new allergies since your discharge?: No Dietary orders reviewed?: Yes Type of Diet Ordered:: low salt/heart healthy Do you have support at home?: Yes People in Home: other relative(s) Name of Support/Comfort Primary Source: [facility] and Equipment/Supplies: Were Home Health Services Ordered?: Yes Name of Home Health Agency:: [facility] Has Agency set up a time to come to your home?: No EMR reviewed for Home Health Orders:  (advised pt to contact agency if he does not hear from them within 48hrs post-discharge-confirmed he has agency contact info) Any new equipment or medical supplies ordered?: No (patient states he tried to get set up with home oxygen but did not qualify for it)  Functional Questionnaire: Do you need assistance with bathing/showering or dressing?: No Do you need assistance with meal preparation?: No Do you need assistance with eating?: No Do you have difficulty maintaining continence: No Do you need assistance with getting out of bed/getting out of a  chair/moving?: No Do you have difficulty managing or taking your medications?: No  Follow up appointments reviewed: PCP Follow-up appointment confirmed?: Yes Date of PCP follow-up appointment?: 06/27/22 Follow-up Provider: [facility] Follow-up appointment confirmed?: NA Do you need transportation to your follow-up appointment?: No (pt states he rides bus or nephew takes him) Do you understand care options if your condition(s) worsen?: Yes-patient verbalized understanding  SDOH Interventions Today    Flowsheet Row Most Recent Value  SDOH Interventions   Food Insecurity Interventions Intervention Not Indicated  Transportation Interventions Intervention Not Indicated  [pt states his nephew takes him to appts as needed]      TOC Interventions Today    Flowsheet Row Most Recent Value  TOC Interventions   TOC Interventions Discussed/Reviewed TOC Interventions Discussed, Post discharge activity limitations per provider, Arranged PCP follow up within 7 days/Care Guide scheduled      Interventions Today    Flowsheet Row Most Recent Value  General Interventions   General Interventions Discussed/Reviewed General Interventions Discussed, Doctor Visits  Doctor Visits Discussed/Reviewed Doctor Visits Discussed, PCP  PCP/Specialist Visits Compliance with follow-up visit  Education Interventions   Education Provided Provided Education  Provided Verbal Education On Nutrition, When to see the doctor, Medication, Other  [resp sx mgmt, smoking cessation-pt states he quit smoking 'cold [place]" last year]  Nutrition Interventions   Nutrition Discussed/Reviewed Nutrition Discussed, Adding fruits and vegetables, Decreasing salt  Pharmacy Interventions   Pharmacy Dicussed/Reviewed Pharmacy Topics Discussed, Medications and their functions  Safety Interventions   Safety Discussed/Reviewed Safety Discussed       Alessandra Grout [facility] Care  Management Care Management Community Coordinator Direct Phone: [phone] Toll Free: [phone] Fax: [phone]

## 2022-06-27 ENCOUNTER — Encounter: Payer: Self-pay | Admitting: Nurse Practitioner

## 2022-06-27 ENCOUNTER — Other Ambulatory Visit: Payer: Self-pay

## 2022-06-27 ENCOUNTER — Other Ambulatory Visit (HOSPITAL_COMMUNITY): Payer: Self-pay

## 2022-06-27 ENCOUNTER — Ambulatory Visit: Payer: Medicare Other | Attending: Nurse Practitioner | Admitting: Nurse Practitioner

## 2022-06-27 ENCOUNTER — Other Ambulatory Visit: Payer: Self-pay | Admitting: Internal Medicine

## 2022-06-27 VITALS — BP 136/90 | HR 73 | Ht 67.0 in | Wt 123.4 lb

## 2022-06-27 DIAGNOSIS — J432 Centrilobular emphysema: Secondary | ICD-10-CM | POA: Diagnosis not present

## 2022-06-27 DIAGNOSIS — Z1211 Encounter for screening for malignant neoplasm of colon: Secondary | ICD-10-CM | POA: Diagnosis not present

## 2022-06-27 DIAGNOSIS — E7800 Pure hypercholesterolemia, unspecified: Secondary | ICD-10-CM | POA: Diagnosis not present

## 2022-06-27 DIAGNOSIS — R630 Anorexia: Secondary | ICD-10-CM

## 2022-06-27 DIAGNOSIS — Z09 Encounter for follow-up examination after completed treatment for conditions other than malignant neoplasm: Secondary | ICD-10-CM | POA: Diagnosis not present

## 2022-06-27 DIAGNOSIS — Z87891 Personal history of nicotine dependence: Secondary | ICD-10-CM | POA: Diagnosis not present

## 2022-06-27 DIAGNOSIS — G4709 Other insomnia: Secondary | ICD-10-CM | POA: Diagnosis not present

## 2022-06-27 DIAGNOSIS — I10 Essential (primary) hypertension: Secondary | ICD-10-CM

## 2022-06-27 MED ORDER — MIRTAZAPINE 15 MG PO TABS
7.5000 mg | ORAL_TABLET | Freq: Every day | ORAL | 1 refills | Status: DC
Start: 2022-06-27 — End: 2023-01-31
  Filled 2022-06-27: qty 30, 60d supply, fill #0
  Filled 2022-08-19: qty 30, 60d supply, fill #1
  Filled 2022-10-10: qty 30, 60d supply, fill #2
  Filled 2022-12-29: qty 30, 60d supply, fill #3

## 2022-06-27 MED ORDER — TRELEGY ELLIPTA 200-62.5-25 MCG/ACT IN AEPB
1.0000 | INHALATION_SPRAY | Freq: Every day | RESPIRATORY_TRACT | 6 refills | Status: DC
Start: 2022-06-27 — End: 2022-07-20
  Filled 2022-06-27: qty 60, 30d supply, fill #0

## 2022-06-27 MED ORDER — AMLODIPINE BESYLATE 5 MG PO TABS
5.0000 mg | ORAL_TABLET | Freq: Every day | ORAL | 3 refills | Status: DC
Start: 2022-06-27 — End: 2023-07-24
  Filled 2022-06-27 – 2022-08-19 (×2): qty 90, 90d supply, fill #0
  Filled 2022-11-23: qty 90, 90d supply, fill #1
  Filled 2023-03-29: qty 90, 90d supply, fill #2

## 2022-06-27 MED ORDER — ATORVASTATIN CALCIUM 10 MG PO TABS
10.0000 mg | ORAL_TABLET | Freq: Every day | ORAL | 3 refills | Status: DC
Start: 2022-06-27 — End: 2023-06-28
  Filled 2022-06-27 – 2022-08-19 (×2): qty 90, 90d supply, fill #0
  Filled 2022-11-23: qty 90, 90d supply, fill #1
  Filled 2023-02-20: qty 90, 90d supply, fill #2

## 2022-06-27 MED ORDER — IPRATROPIUM-ALBUTEROL 0.5-2.5 (3) MG/3ML IN SOLN
3.0000 mL | Freq: Four times a day (QID) | RESPIRATORY_TRACT | 2 refills | Status: DC | PRN
  Filled 2022-06-27: qty 360, 30d supply, fill #0

## 2022-06-27 NOTE — Progress Notes (Addendum)
Assessment & Plan:  Jason Wong was seen today for hospitalization follow-up.  Diagnoses and all orders for this visit:  Hospital discharge follow-up CT lung cancer screening/fu PNA   Essential hypertension Well controlled -     amLODipine (NORVASC) 5 MG tablet; Take 1 tablet (5 mg total) by mouth daily. Continue all antihypertensives as prescribed.  Reminded to bring in blood pressure log for follow  up appointment.  RECOMMENDATIONS: DASH/Mediterranean Diets are healthier choices for HTN.    Centrilobular emphysema -     Fluticasone-Umeclidin-Vilant (TRELEGY ELLIPTA) 200-62.5-25 MCG/ACT AEPB; Inhale 1 puff into the lungs daily. Follow up with Pulmonology. Contact number for office given to Jason. Wong today -     ipratropium-albuterol (DUONEB) 0.5-2.5 (3) MG/3ML SOLN; Take 3 mLs by nebulization every 6 (six) hours as needed (Shortness of breath or wheezing).  Poor appetite -     mirtazapine (REMERON) 15 MG tablet; Take 0.5 tablets (7.5 mg total) by mouth at bedtime.  Other insomnia -     mirtazapine (REMERON) 15 MG tablet; Take 0.5 tablets (7.5 mg total) by mouth at bedtime.  History of tobacco abuse -     CT CHEST LUNG CA SCREEN LOW DOSE W/O CM; Future  Pure hypercholesterolemia -     atorvastatin (LIPITOR) 10 MG tablet; Take 1 tablet (10 mg total) by mouth daily.  Colon cancer screening -     Ambulatory referral to Gastroenterology      Patient has been counseled on age-appropriate routine health concerns for screening and prevention. These are reviewed and up-to-date. Referrals have been placed accordingly. Immunizations are up-to-date or declined.    Subjective:   Chief Complaint  Patient presents with   Hospitalization Follow-up   HPI Jason Wong 67 y.o. male presents to office today for HFU  He has a PMH of HTN, Hep C, substance abuse (crack/cocaine), GERD, HPL, COPD and peripheral polyneuropathy.  Jason Wong was admitted from 06-19-2022 through 06-23-2022 with COPD  with acute exacerbation (tobacco dependence), acute hypoxemic resp failure (did not qualify for home O2), multi focal PNA    PER HOSPITAL NOTE:  Patient presenting to ED with progressive shortness of breath, wheezing and nonproductive cough. Etiology likely multifactorial in setting of acute COPD exacerbation, metapneumovirus viral infection and community-acquired pneumonia.  Was noted to be hypoxic on admission with SpO2 86% on room air. -- Pulmicort neb twice daily -- Brovana neb twice daily -- Solu-Medrol 40 mg IV q12h-- changed to PO in the AM -- duonebs q6h -- Albuterol neb every 4 hours as needed wheezing/shortness of breath -- did not qualify for O2 Chest x-ray and CT angiogram chest notable for consolidation right upper lobe and lower lobes bilaterally. -- finished abx treatment History of substance/cocaine abuse UDS positive for cocaine on 06-20-2022 ETOH abuse Counseled on need for complete cessation. -- CIWA protocol with symptom triggered Ativan -- Multivitamin, folate, thiamine  Weakness/debility/deconditioning: Seen by PT with recommendation of home health on discharge.  Today he reports significant improvement in symptoms. Still with a productive cough but did not use his nebulizer this morning like he usually dose. He is requesting refill of trelegy.   Has been taking a whole tablet of remeron instead of the prescribed half tablet. Needs early refill.      Review of Systems  Constitutional:  Negative for fever, malaise/fatigue and weight loss.  HENT: Negative.  Negative for nosebleeds.   Eyes: Negative.  Negative for blurred vision, double vision and photophobia.  Respiratory:  Positive  for cough and sputum production. Negative for shortness of breath and wheezing.   Cardiovascular: Negative.  Negative for chest pain, palpitations and leg swelling.  Gastrointestinal: Negative.  Negative for heartburn, nausea and vomiting.  Musculoskeletal: Negative.  Negative for  myalgias.  Neurological: Negative.  Negative for dizziness, focal weakness, seizures and headaches.  Psychiatric/Behavioral: Negative.  Negative for suicidal ideas.     Past Medical History:  Diagnosis Date   Asthma    COPD (chronic obstructive pulmonary disease)    Hepatitis C antibody positive in blood    HLD (hyperlipidemia)    HTN (hypertension)    On home oxygen therapy    per pt 08-31-2021 uses 02 about every other day    Past Surgical History:  Procedure Laterality Date   INCISE AND DRAIN ABCESS     dog bite    Family History  Problem Relation Age of Onset   Hypertension Mother    Breast cancer Sister    Hypertension Sister    Colon cancer Brother    Colon polyps Neg Hx    Esophageal cancer Neg Hx    Rectal cancer Neg Hx    Stomach cancer Neg Hx     Social History Reviewed with no changes to be made today.   Outpatient Medications Prior to Visit  Medication Sig Dispense Refill   acetaminophen (TYLENOL) 500 MG tablet Take 500 mg by mouth every 6 (six) hours as needed for moderate pain.     albuterol (PROAIR HFA) 108 (90 Base) MCG/ACT inhaler Inhale 2 puffs into the lungs once every 4 (four) hours as needed for wheezing or shortness of breath. 6.7 g 3   amLODipine (NORVASC) 5 MG tablet Take 1 tablet (5 mg total) by mouth daily. 90 tablet 3   Fluticasone-Umeclidin-Vilant (TRELEGY ELLIPTA) 200-62.5-25 MCG/ACT AEPB Inhale 1 puff into the lungs daily. 60 each 6   mirtazapine (REMERON) 15 MG tablet Take 0.5 tablets (7.5 mg total) by mouth at bedtime. (Patient taking differently: Take 15 mg by mouth at bedtime.) 45 tablet 1   thiamine (VITAMIN B-1) 100 MG tablet Take 1 tablet (100 mg total) by mouth daily. 30 tablet 0   atorvastatin (LIPITOR) 10 MG tablet Take 1 tablet (10 mg total) by mouth daily. (Patient not taking: Reported on 06/27/2022) 90 tablet 3   ipratropium-albuterol (DUONEB) 0.5-2.5 (3) MG/3ML SOLN Take 3 mLs by nebulization every 6 (six) hours as needed  (Shortness of breath or wheezing). 360 mL 2   nicotine (NICODERM CQ - DOSED IN MG/24 HR) 7 mg/24hr patch Place 1 patch (7 mg total) onto the skin daily. (Patient not taking: Reported on 06/24/2022) 28 patch 0   pantoprazole (PROTONIX) 40 MG tablet Take 1 tablet (40 mg total) by mouth daily at 6 (six) AM. (Patient not taking: Reported on 06/27/2022) 90 tablet 0   predniSONE (DELTASONE) 20 MG tablet Take 2 tablets (40 mg total) by mouth daily with breakfast for 3 days. (Patient not taking: Reported on 06/27/2022) 6 tablet 0   No facility-administered medications prior to visit.    Allergies  Allergen Reactions   Tobacco Shortness Of Breath and Other (See Comments)    CIGARETTE SMOKE TRIGGERS THE PATIENT'S RESPIRATORY ISSUES!!       Objective:    BP (!) 136/90   Pulse 73   Ht 5\' 7"  (1.702 m)   Wt 123 lb 6.4 oz (56 kg)   SpO2 98%   BMI 19.33 kg/m  Wt Readings from Last 3 Encounters:  06/27/22 123 lb 6.4 oz (56 kg)  06/20/22 118 lb 13.3 oz (53.9 kg)  05/09/22 127 lb 9.6 oz (57.9 kg)    Physical Exam Vitals and nursing note reviewed.  Constitutional:      Appearance: He is well-developed.  HENT:     Head: Normocephalic and atraumatic.  Cardiovascular:     Rate and Rhythm: Normal rate and regular rhythm.     Heart sounds: Normal heart sounds. No murmur heard.    No friction rub. No gallop.  Pulmonary:     Effort: Pulmonary effort is normal. No tachypnea or respiratory distress.     Breath sounds: Normal breath sounds. No decreased breath sounds, wheezing, rhonchi or rales.  Chest:     Chest wall: No tenderness.  Abdominal:     General: Bowel sounds are normal.     Palpations: Abdomen is soft.  Musculoskeletal:        General: Normal range of motion.     Cervical back: Normal range of motion.  Skin:    General: Skin is warm and dry.  Neurological:     Mental Status: He is alert and oriented to person, place, and time.     Coordination: Coordination normal.  Psychiatric:         Behavior: Behavior normal. Behavior is cooperative.        Thought Content: Thought content normal.        Judgment: Judgment normal.          Patient has been counseled extensively about nutrition and exercise as well as the importance of adherence with medications and regular follow-up. The patient was given clear instructions to go to ER or return to medical center if symptoms don't improve, worsen or new problems develop. The patient verbalized understanding.   Follow-up: Return in about 3 months (around 09/26/2022) for with johnson.   Claiborne RiggZelda W Ela Moffat, FNP-BC [facility] [place], [place] [phone]   06/27/2022, 9:59 AM

## 2022-06-27 NOTE — Progress Notes (Signed)
5'7 

## 2022-06-27 NOTE — Patient Instructions (Signed)
Dr. Chilton Greathouse PULMONOLOGY Address: [address], [place], [place] [address] Phone: [phone]

## 2022-06-28 NOTE — Telephone Encounter (Signed)
Unable to refill per protocol, Rx expired. Discontinued 06/27/22.  Requested Prescriptions  Pending Prescriptions Disp Refills   pantoprazole (PROTONIX) 40 MG tablet 90 tablet 0    Sig: Take 1 tablet (40 mg total) by mouth daily at 6 (six) AM.     Gastroenterology: Proton Pump Inhibitors Passed - 06/27/2022  7:48 AM      Passed - Valid encounter within last 12 months    Recent Outpatient Visits           [facility] discharge follow-up   [facility] [place], Shea Stakes, NP   1 month ago Essential hypertension   [facility] Marcine Matar, MD   5 months ago Centrilobular emphysema [facility])   [facility] Marcine Matar, MD   10 months ago Hospital discharge follow-up   [facility] Marcine Matar, MD   10 months ago Encounter for [employer] annual wellness exam   [facility] Drucilla Chalet, RPH-CPP       Future Appointments             In 2 months Laural Benes, Binnie Rail, MD [facility]   In 3 months Laural Benes, Binnie Rail, MD [facility]

## 2022-07-01 ENCOUNTER — Other Ambulatory Visit: Payer: Self-pay

## 2022-07-20 ENCOUNTER — Other Ambulatory Visit: Payer: Self-pay

## 2022-07-20 ENCOUNTER — Ambulatory Visit (INDEPENDENT_AMBULATORY_CARE_PROVIDER_SITE_OTHER): Payer: Medicare Other | Admitting: Pulmonary Disease

## 2022-07-20 ENCOUNTER — Encounter: Payer: Self-pay | Admitting: Pulmonary Disease

## 2022-07-20 VITALS — BP 110/68 | HR 89 | Ht 67.0 in | Wt 124.4 lb

## 2022-07-20 DIAGNOSIS — J432 Centrilobular emphysema: Secondary | ICD-10-CM

## 2022-07-20 DIAGNOSIS — J4540 Moderate persistent asthma, uncomplicated: Secondary | ICD-10-CM

## 2022-07-20 MED ORDER — TRELEGY ELLIPTA 200-62.5-25 MCG/ACT IN AEPB
1.0000 | INHALATION_SPRAY | Freq: Every day | RESPIRATORY_TRACT | 5 refills | Status: DC
Start: 2022-07-20 — End: 2022-08-19
  Filled 2022-07-20 (×2): qty 60, 30d supply, fill #0
  Filled 2022-08-16: qty 60, 30d supply, fill #1

## 2022-07-20 MED ORDER — TRELEGY ELLIPTA 200-62.5-25 MCG/ACT IN AEPB: 1.0000 | INHALATION_SPRAY | Freq: Every day | RESPIRATORY_TRACT | 0 refills | Status: DC

## 2022-07-20 NOTE — Patient Instructions (Signed)
I am glad you are doing well with your breathing We will give you a sample of Trelegy inhaler and send a prescription for it Follow-up in 6 months.

## 2022-07-20 NOTE — Progress Notes (Signed)
Jason Wong    [id number]    1956-03-03  Primary Care Physician:Johnson, Binnie Rail, MD  Referring Physician: Marcine Matar, MD [address] [place],  [place] [address]  Chief complaint: Follow up for COPD  HPI: 67 y.o. with history of tobacco use, alcohol dependence, hypertension, hyperlipidemia, COPD, childhood asthma, GERD He has had at least 10 admissions and ED visits over the past year for recurrent COPD exacerbations.  It is unclear as to what inhalers he is on.  He has been prescribed controller medications on multiple occasions but is just using albuterol intermittently.  He was also supposed to be on a nebulizer but does not have the medications for it.  He was discharged home on supplemental oxygen but is not using it.  He is a poor historian and cannot give me clear clinical history  Pets: Dogs, cats Occupation: Retired Museum/gallery exhibitions officer Exposures: No mold, hot tub, Jacuzzi.  No feather pillows or comforter Smoking history: 23-pack-year smoker.  Family quit smoking in May 2023 Travel history: No significant travel history Relevant family history: No family history of lung disease.  Interim history: Had a hospitalization recently  for COPD exacerbation and community-acquired pneumonia.  Discharged on 06/23/2022 Continues to have dyspnea on exertion.  He is out of his Trelegy and albuterol inhaler.  Has cough with mucus production with occasional wheezing.  Denies any fevers, chills.  Outpatient Encounter Medications as of 07/20/2022  Medication Sig   acetaminophen (TYLENOL) 500 MG tablet Take 500 mg by mouth every 6 (six) hours as needed for moderate pain.   amLODipine (NORVASC) 5 MG tablet Take 1 tablet (5 mg total) by mouth daily.   atorvastatin (LIPITOR) 10 MG tablet Take 1 tablet (10 mg total) by mouth daily.   ipratropium-albuterol (DUONEB) 0.5-2.5 (3) MG/3ML SOLN Take 3 mLs by nebulization every 6 (six) hours as needed (Shortness of breath or  wheezing).   mirtazapine (REMERON) 15 MG tablet Take 0.5 tablets (7.5 mg total) by mouth at bedtime.   albuterol (PROAIR HFA) 108 (90 Base) MCG/ACT inhaler Inhale 2 puffs into the lungs once every 4 (four) hours as needed for wheezing or shortness of breath. (Patient not taking: Reported on 07/20/2022)   Fluticasone-Umeclidin-Vilant (TRELEGY ELLIPTA) 200-62.5-25 MCG/ACT AEPB Inhale 1 puff into the lungs daily. (Patient not taking: Reported on 07/20/2022)   thiamine (VITAMIN B-1) 100 MG tablet Take 1 tablet (100 mg total) by mouth daily. (Patient not taking: Reported on 07/20/2022)   No facility-administered encounter medications on file as of 07/20/2022.    Allergies as of 07/20/2022 - Review Complete 07/20/2022  Allergen Reaction Noted   Tobacco Shortness Of Breath and Other (See Comments) 04/30/2021   Physical Exam: Blood pressure 110/68, pulse 89, height 5\' 7"  (1.702 m), weight 124 lb 6.4 oz (56.4 kg), SpO2 100 %. Gen:      No acute distress HEENT:  EOMI, sclera anicteric Neck:     No masses; no thyromegaly Lungs:    Clear to auscultation bilaterally; normal respiratory effort CV:         Regular rate and rhythm; no murmurs Abd:      + bowel sounds; soft, non-tender; no palpable masses, no distension Ext:    No edema; adequate peripheral perfusion Skin:      Warm and dry; no rash Neuro: alert and oriented x 3 Psych: normal mood and affect   Data Reviewed: Imaging: Lung cancer screening 01/22/2020-mild to moderate  emphysema.  No lung nodules CTA 08/12/2021-no evidence of pulm embolism, emphysema. CTA 06/20/2022-no PE, mild airspace disease in the right upper lobe and lower lobe, severe emphysema. I reviewed the images personally.  PFTs: 10/18/2021 DLCO 11.31 [46%] Unable to complete spirometry and lung volumes.  Unreliable test due to poor patient effort Moderate/severe diffusion impairment  Labs: CBC 10/19/2021-WBC 5.4, eos 7.7%, absolute eosinophil count 416 IgE 10/19/2021-2671 Alpha-1  antitrypsin levels and phenotype 10/19/2021-125, PI MM  Assessment:  Emphysema, asthma Likely has COPD given changes of emphysema on CT scans and recurrent exacerbations.  May have underlying asthma as well as he has history of childhood issues with asthma.  Has  Has a recent discharge after recent exacerbation Will need to restart his Trelegy inhaler.  Will give him a sample and send a prescription Of Trelegy inhaler.  Unable to obtain full PFTs in the past due to difficulty completing the test  If he continues to have exacerbations then consider addition of Dupixent for control of T2 inflammation  Ex-smoker Referral for lung cancer screening CT to start in March 2025  Plan/Recommendations: Resume Trelegy Follow-up in 6 months  Chilton Greathouse MD [facility] 07/20/2022, 9:32 AM  CC: Marcine Matar, MD

## 2022-07-21 ENCOUNTER — Other Ambulatory Visit: Payer: Self-pay

## 2022-07-23 ENCOUNTER — Encounter: Payer: Self-pay | Admitting: Pulmonary Disease

## 2022-07-28 ENCOUNTER — Encounter: Payer: Self-pay | Admitting: Family Medicine

## 2022-08-01 ENCOUNTER — Inpatient Hospital Stay: Admission: RE | Admit: 2022-08-01 | Payer: Medicare Other | Source: Ambulatory Visit

## 2022-08-03 ENCOUNTER — Other Ambulatory Visit: Payer: Self-pay

## 2022-08-16 ENCOUNTER — Other Ambulatory Visit: Payer: Self-pay

## 2022-08-17 IMAGING — DX DG CHEST 1V PORT
1 series · 1 of 1 positions shown · non-contrast
Comparison: 07/17/2015

CLINICAL DATA: Found unresponsive

EXAM:
PORTABLE CHEST 1 VIEW

[chest]
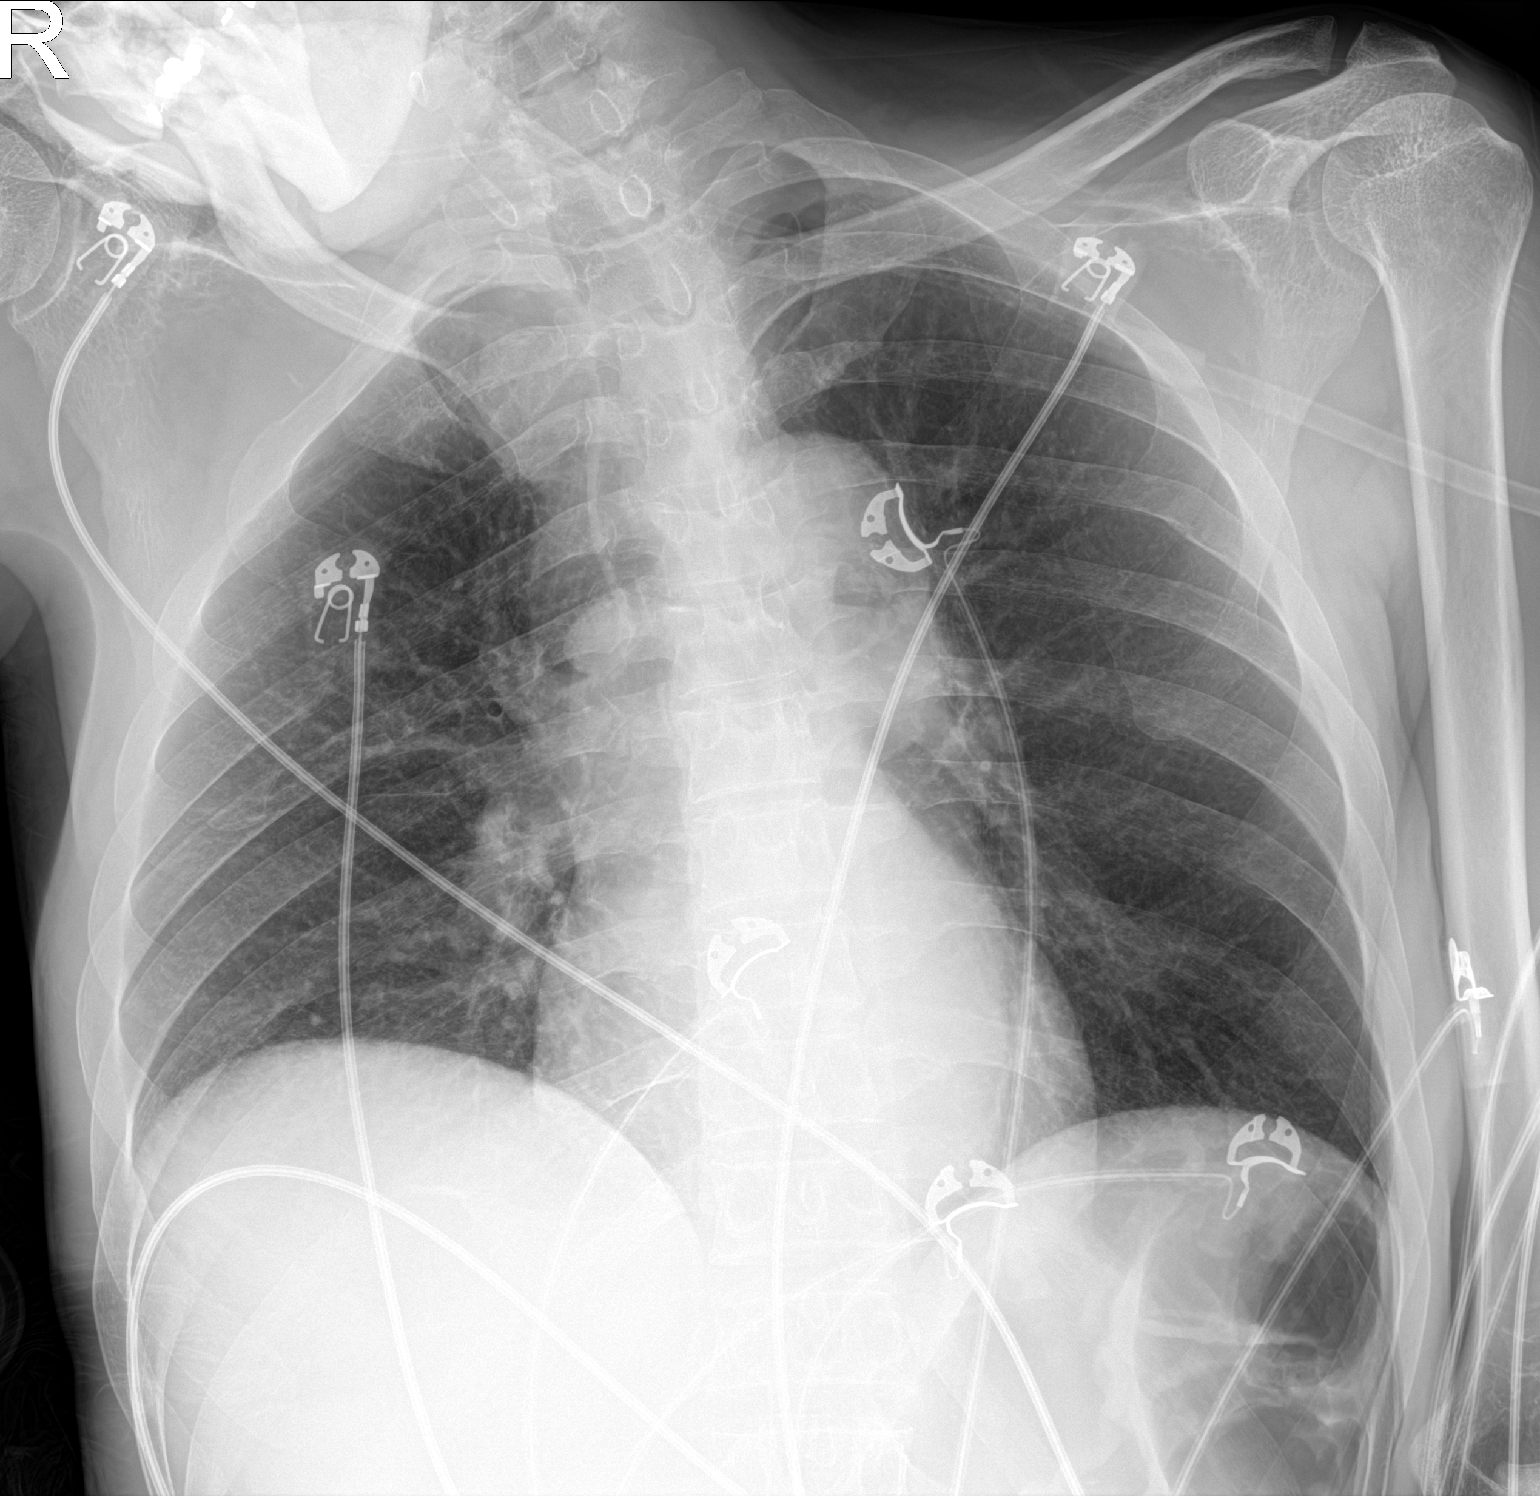

[1 of 1 positions shown; findings below may reference images not displayed]

FINDINGS: Single frontal view of the chest demonstrates an unremarkable
cardiac silhouette. No airspace disease, effusion, or pneumothorax.
No acute bony abnormalities.
IMPRESSION: 1. No acute intrathoracic process.

## 2022-08-19 ENCOUNTER — Other Ambulatory Visit: Payer: Self-pay

## 2022-08-19 ENCOUNTER — Emergency Department (HOSPITAL_COMMUNITY): Payer: Medicare Other

## 2022-08-19 ENCOUNTER — Encounter (HOSPITAL_COMMUNITY): Payer: Self-pay

## 2022-08-19 ENCOUNTER — Emergency Department (HOSPITAL_COMMUNITY)
Admission: EM | Admit: 2022-08-19 | Discharge: 2022-08-19 | Disposition: A | Discharge status: Home / Self Care | Payer: Medicare Other | Attending: Emergency Medicine | Admitting: Emergency Medicine

## 2022-08-19 DIAGNOSIS — F17210 Nicotine dependence, cigarettes, uncomplicated: Secondary | ICD-10-CM | POA: Diagnosis not present

## 2022-08-19 DIAGNOSIS — Z1152 Encounter for screening for COVID-19: Secondary | ICD-10-CM | POA: Diagnosis not present

## 2022-08-19 DIAGNOSIS — J432 Centrilobular emphysema: Secondary | ICD-10-CM

## 2022-08-19 DIAGNOSIS — I10 Essential (primary) hypertension: Secondary | ICD-10-CM | POA: Diagnosis not present

## 2022-08-19 DIAGNOSIS — R0602 Shortness of breath: Secondary | ICD-10-CM | POA: Diagnosis not present

## 2022-08-19 DIAGNOSIS — Z7951 Long term (current) use of inhaled steroids: Secondary | ICD-10-CM | POA: Diagnosis not present

## 2022-08-19 DIAGNOSIS — Z79899 Other long term (current) drug therapy: Secondary | ICD-10-CM | POA: Diagnosis not present

## 2022-08-19 DIAGNOSIS — J441 Chronic obstructive pulmonary disease with (acute) exacerbation: Secondary | ICD-10-CM | POA: Insufficient documentation

## 2022-08-19 LAB — CBC WITH DIFFERENTIAL/PLATELET
Abs Immature Granulocytes: 0.01 10*3/uL (ref 0.00–0.07)
Basophils Absolute: 0.1 10*3/uL (ref 0.0–0.1)
Basophils Relative: 1 %
Eosinophils Absolute: 1 10*3/uL — ABNORMAL HIGH (ref 0.0–0.5)
Eosinophils Relative: 14 %
HCT: 42.9 % (ref 39.0–52.0)
Hemoglobin: 13.6 g/dL (ref 13.0–17.0)
Immature Granulocytes: 0 %
Lymphocytes Relative: 37 %
Lymphs Abs: 2.6 10*3/uL (ref 0.7–4.0)
MCH: 32.3 pg (ref 26.0–34.0)
MCHC: 31.7 g/dL (ref 30.0–36.0)
MCV: 101.9 fL — ABNORMAL HIGH (ref 80.0–100.0)
Monocytes Absolute: 0.5 10*3/uL (ref 0.1–1.0)
Monocytes Relative: 8 %
Neutro Abs: 2.8 10*3/uL (ref 1.7–7.7)
Neutrophils Relative %: 40 %
Platelets: 292 10*3/uL (ref 150–400)
RBC: 4.21 MIL/uL — ABNORMAL LOW (ref 4.22–5.81)
RDW: 13.8 % (ref 11.5–15.5)
WBC: 7.1 10*3/uL (ref 4.0–10.5)
nRBC: 0 % (ref 0.0–0.2)

## 2022-08-19 LAB — COMPREHENSIVE METABOLIC PANEL
ALT: 20 U/L (ref 0–44)
AST: 32 U/L (ref 15–41)
Albumin: 4.5 g/dL (ref 3.5–5.0)
Alkaline Phosphatase: 76 U/L (ref 38–126)
Anion gap: 13 (ref 5–15)
BUN: 11 mg/dL (ref 8–23)
CO2: 18 mmol/L — ABNORMAL LOW (ref 22–32)
Calcium: 9.3 mg/dL (ref 8.9–10.3)
Chloride: 105 mmol/L (ref 98–111)
Creatinine, Ser: 1.16 mg/dL (ref 0.61–1.24)
GFR, Estimated: >60 mL/min (ref >60)
Glucose, Bld: 103 mg/dL — ABNORMAL HIGH (ref 70–99)
Potassium: 4.4 mmol/L (ref 3.5–5.1)
Sodium: 136 mmol/L (ref 135–145)
Total Bilirubin: 0.9 mg/dL (ref 0.3–1.2)
Total Protein: 7.7 g/dL (ref 6.5–8.1)

## 2022-08-19 LAB — I-STAT VENOUS BLOOD GAS, ED
Acid-base deficit: 5 mmol/L — ABNORMAL HIGH (ref 0.0–2.0)
Bicarbonate: 21.1 mmol/L (ref 20.0–28.0)
Calcium, Ion: 1.13 mmol/L — ABNORMAL LOW (ref 1.15–1.40)
HCT: 43 % (ref 39.0–52.0)
Hemoglobin: 14.6 g/dL (ref 13.0–17.0)
O2 Saturation: 89 %
Potassium: 4.5 mmol/L (ref 3.5–5.1)
Sodium: 140 mmol/L (ref 135–145)
TCO2: 22 mmol/L (ref 22–32)
pCO2, Ven: 42.7 mm[Hg] — ABNORMAL LOW (ref 44–60)
pH, Ven: 7.303 (ref 7.25–7.43)
pO2, Ven: 63 mm[Hg] — ABNORMAL HIGH (ref 32–45)

## 2022-08-19 LAB — TROPONIN I (HIGH SENSITIVITY)
Troponin I (High Sensitivity): 5 ng/L (ref <18)
Troponin I (High Sensitivity): 4 ng/L (ref <18)

## 2022-08-19 LAB — ETHANOL: Alcohol, Ethyl (B): <10 mg/dL (ref <10)

## 2022-08-19 LAB — BRAIN NATRIURETIC PEPTIDE: B Natriuretic Peptide: 52.4 pg/mL (ref 0.0–100.0)

## 2022-08-19 LAB — SARS CORONAVIRUS 2 BY RT PCR: SARS Coronavirus 2 by RT PCR: NEGATIVE

## 2022-08-19 MED ORDER — TRELEGY ELLIPTA 200-62.5-25 MCG/ACT IN AEPB
1.0000 | INHALATION_SPRAY | Freq: Every day | RESPIRATORY_TRACT | 5 refills | Status: DC
Start: 2022-08-19 — End: 2022-11-12
  Filled 2022-08-19: qty 60, 30d supply, fill #0
  Filled 2022-09-13: qty 60, 30d supply, fill #1
  Filled 2022-10-10: qty 60, 30d supply, fill #2

## 2022-08-19 MED ORDER — IPRATROPIUM-ALBUTEROL 0.5-2.5 (3) MG/3ML IN SOLN
3.0000 mL | RESPIRATORY_TRACT | Status: AC
  Administered 2022-08-19 (×3): 3 mL via RESPIRATORY_TRACT
  Filled 2022-08-19: qty 6
  Filled 2022-08-19: qty 3

## 2022-08-19 MED ORDER — PREDNISONE 20 MG PO TABS
40.0000 mg | ORAL_TABLET | Freq: Every day | ORAL | 0 refills | Status: DC
  Filled 2022-08-19: qty 8, 4d supply, fill #0

## 2022-08-19 MED ORDER — CHLORDIAZEPOXIDE HCL 25 MG PO CAPS
50.0000 mg | ORAL_CAPSULE | Freq: Once | ORAL | Status: AC
  Administered 2022-08-19: 50 mg via ORAL
  Filled 2022-08-19: qty 2

## 2022-08-19 MED ORDER — AMLODIPINE BESYLATE 5 MG PO TABS
5.0000 mg | ORAL_TABLET | Freq: Once | ORAL | Status: AC
  Administered 2022-08-19: 5 mg via ORAL
  Filled 2022-08-19: qty 1

## 2022-08-19 MED ORDER — METHYLPREDNISOLONE SODIUM SUCC 125 MG IJ SOLR
125.0000 mg | Freq: Once | INTRAMUSCULAR | Status: AC
  Administered 2022-08-19: 125 mg via INTRAVENOUS
  Filled 2022-08-19: qty 2

## 2022-08-19 MED ORDER — THIAMINE HCL 100 MG/ML IJ SOLN
100.0000 mg | Freq: Once | INTRAMUSCULAR | Status: AC
  Administered 2022-08-19: 100 mg via INTRAVENOUS
  Filled 2022-08-19: qty 2

## 2022-08-19 MED ORDER — DOXYCYCLINE HYCLATE 100 MG PO TABS
100.0000 mg | ORAL_TABLET | Freq: Once | ORAL | Status: AC
  Administered 2022-08-19: 100 mg via ORAL
  Filled 2022-08-19: qty 1

## 2022-08-19 MED ORDER — IPRATROPIUM-ALBUTEROL 0.5-2.5 (3) MG/3ML IN SOLN
3.0000 mL | Freq: Once | RESPIRATORY_TRACT | Status: AC
  Administered 2022-08-19: 3 mL via RESPIRATORY_TRACT
  Filled 2022-08-19: qty 3

## 2022-08-19 MED ORDER — MAGNESIUM SULFATE 2 GM/50ML IV SOLN
2.0000 g | Freq: Once | INTRAVENOUS | Status: AC
  Administered 2022-08-19: 2 g via INTRAVENOUS
  Filled 2022-08-19: qty 50

## 2022-08-19 MED ORDER — DOXYCYCLINE HYCLATE 100 MG PO TABS
100.0000 mg | ORAL_TABLET | Freq: Two times a day (BID) | ORAL | 0 refills | Status: DC
  Filled 2022-08-19: qty 13, 7d supply, fill #0

## 2022-08-19 MED ORDER — IPRATROPIUM-ALBUTEROL 0.5-2.5 (3) MG/3ML IN SOLN
3.0000 mL | Freq: Four times a day (QID) | RESPIRATORY_TRACT | 2 refills | Status: DC | PRN
  Filled 2022-08-19: qty 360, 30d supply, fill #0

## 2022-08-19 NOTE — Discharge Planning (Signed)
Pt currently active with [facility] for Home Health services RN and PT as confirmed by [facility] with Elnita Maxwell of [facility].  Pt will resume HH services with [facility]. Nos DME needs identified at this time include.

## 2022-08-19 NOTE — ED Notes (Signed)
Attempted IV twice with no success. Will notify MD and consult IV team.

## 2022-08-19 NOTE — Discharge Instructions (Signed)
You were seen in the emergency department today for a COPD exacerbation. You were given breathing treatments and your first dose of steroids here. You were prescribed additional steroids (prednisone) to take during the following 4 days to help your symptoms improve. Please take this as directed.  You were also given a prescription of antibiotics called doxycycline.  Take as prescribed do not miss any doses.  Continue to use your albuterol every 4-6 hours as needed and continue to take any other asthma medication that you use.   Please make an appointment with your primary care doctor within one week to assure improvement or resolution in symptoms.  If your breathing worsens and does not improve with your inhaler, you develop severe chest pain, fainting episodes, or you need to use your inhaler more than 2 times every 4 hours, you should return to the emergency department.

## 2022-08-19 NOTE — ED Triage Notes (Signed)
Here for shortness of breath since Monday with nonproductive cough. Hx of COPD and uses an inhaler with mild relief. O2 dropped in the 70s on RA upon arrival. Placed on O2. Pt is now 100% on RA. MD at bedside.

## 2022-08-19 NOTE — ED Provider Notes (Signed)
[name] EMERGENCY DEPARTMENT AT [facility] Provider Note   CSN: [id number] Arrival date & time: 08/19/22  4098     History  Chief Complaint  Patient presents with   Shortness of Breath    Jason Wong is a 67 y.o. male. 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  Patient says he is very short of breath on any type of exertion.  He denies any associated chest pain or syncopal episode.  He has been using his albuterol inhaler at home without relief.  He finally came in today due to worsening shortness of breath.  When he was walking around with the techs earlier this morning he was noted to be satting 70s on room air.  He has not been on recent steroids or antibiotics.  He denies any fevers or chills.  He does endorse increased cough from baseline but nonproductive.  No sore throat or congestion.  He does drink alcohol daily and last drink was around 8:30 PM last night.  He endorses getting shaky when he stops drinking but denies hallucinations or seizures.  He did not take any of his blood pressure medication today.  He denies any drug use.  HPI     Home Medications Prior to Admission medications   Medication Sig Start Date End Date Taking? Authorizing Provider  doxycycline (VIBRAMYCIN) 100 MG capsule Take 1 capsule (100 mg total) by mouth 2 (two) times daily for 7 days. 08/19/22 08/26/22 Yes Mardene Sayer, MD  predniSONE (DELTASONE) 20 MG tablet Take 2 tablets (40 mg total) by mouth daily for 4 days. 08/20/22 08/24/22 Yes Mardene Sayer, MD  acetaminophen (TYLENOL) 500 MG tablet Take 500 mg by mouth every 6 (six) hours as needed for moderate pain.    [provider]  albuterol (PROAIR HFA) 108 (90 Base) MCG/ACT inhaler Inhale 2 puffs into the lungs once every 4 (four) hours as needed  for wheezing or shortness of breath. Patient not taking: Reported on 07/20/2022 06/06/22   Marcine Matar, MD  amLODipine (NORVASC) 5 MG tablet Take 1 tablet (5 mg total) by mouth daily. 06/27/22   Claiborne Rigg, NP  atorvastatin (LIPITOR) 10 MG tablet Take 1 tablet (10 mg total) by mouth daily. 06/27/22   Claiborne Rigg, NP  Fluticasone-Umeclidin-Vilant (TRELEGY ELLIPTA) 200-62.5-25 MCG/ACT AEPB Inhale 1 puff into the lungs daily. 07/20/22   Mannam, Colbert Coyer, MD  Fluticasone-Umeclidin-Vilant (TRELEGY ELLIPTA) 200-62.5-25 MCG/ACT AEPB Inhale 1 puff into the lungs daily. 08/19/22   Mardene Sayer, MD  ipratropium-albuterol (DUONEB) 0.5-2.5 (3) MG/3ML SOLN Take 3 mLs by nebulization every 6 (six) hours as needed (Shortness of breath or wheezing). 08/19/22 11/17/22  Mardene Sayer, MD  mirtazapine (REMERON) 15 MG tablet Take 0.5 tablets (7.5 mg total) by mouth at bedtime. 06/27/22   Claiborne Rigg, NP  thiamine (VITAMIN B-1) 100 MG tablet Take 1 tablet (100 mg total) by mouth daily. Patient not taking: Reported on 07/20/2022 06/24/22   Joseph Art, DO      Allergies    Tobacco    Review of Systems   Review of Systems  Physical Exam Updated Vital Signs BP 123/84   Pulse 83   Temp 98.6 F (37 C) (Oral)   Resp 18   Ht 5\' 7"  (1.702 m)   Wt 56 kg   SpO2 90%  BMI 19.34 kg/m  Physical Exam Constitutional: Alert and oriented.  GCS 15, chronically ill in appearance but no acute distress speaking full sentences Eyes: Conjunctivae are normal. ENT      Head: Normocephalic and atraumatic.      Neck: No stridor. Cardiovascular: S1, S2,  Normal and symmetric distal pulses are present in all extremities.Warm and well perfused. Respiratory: Normal respiratory effort.  Mixed wheezes and rhonchi throughout all airspaces, O2 sat mid 90s on room air. Gastrointestinal: Soft and nontender.  Musculoskeletal: Normal range of motion in all extremities.      Right lower leg: No tenderness or  edema.      Left lower leg: No tenderness or edema. Neurologic: Normal speech and language.  No facial droop.  Moving all 4 extremities equally.  Sensation grossly intact.  No gross focal neurologic deficits are appreciated. Skin: Skin is warm, dry and intact. No rash noted. Psychiatric: Mood and affect are normal. Speech and behavior are normal.  ED Results / Procedures / Treatments   Labs (all labs ordered are listed, but only abnormal results are displayed) Labs Reviewed  COMPREHENSIVE METABOLIC PANEL - Abnormal; Notable for the following components:      Result Value   CO2 18 (*)    Glucose, Bld 103 (*)    All other components within normal limits  CBC WITH DIFFERENTIAL/PLATELET - Abnormal; Notable for the following components:   RBC 4.21 (*)    MCV 101.9 (*)    Eosinophils Absolute 1.0 (*)    All other components within normal limits  I-STAT VENOUS BLOOD GAS, ED - Abnormal; Notable for the following components:   pCO2, Ven 42.7 (*)    pO2, Ven 63 (*)    Acid-base deficit 5.0 (*)    Calcium, Ion 1.13 (*)    All other components within normal limits  SARS CORONAVIRUS 2 BY RT PCR  BRAIN NATRIURETIC PEPTIDE  ETHANOL  TROPONIN I (HIGH SENSITIVITY)  TROPONIN I (HIGH SENSITIVITY)    EKG EKG Interpretation  Date/Time:  Friday Aug 19 2022 09:12:52 EDT Ventricular Rate:  80 PR Interval:  149 QRS Duration: 91 QT Interval:  382 QTC Calculation: 441 R Axis:   86 Text Interpretation: Sinus rhythm Borderline right axis deviation Borderline ST elevation, anterolateral leads Concave elevation and early J point elevation No reciprocal changes Confirmed by Vivien Rossetti ([id number]) on 08/19/2022 9:18:25 AM  Radiology DG Chest Portable 1 View  Result Date: 08/19/2022 CLINICAL DATA:  Shortness of breath. EXAM: PORTABLE CHEST 1 VIEW COMPARISON:  06/19/2022. FINDINGS: Improved aeration of the lungs. No consolidation or pulmonary edema. Stable cardiac and mediastinal contours. No pleural  effusion or pneumothorax. IMPRESSION: No evidence of acute cardiopulmonary disease. Electronically Signed   By: Orvan Falconer M.D.   On: 08/19/2022 09:24    Procedures Procedures    Medications Ordered in ED Medications  ipratropium-albuterol (DUONEB) 0.5-2.5 (3) MG/3ML nebulizer solution 3 mL (3 mLs Nebulization Given 08/19/22 0948)  methylPREDNISolone sodium succinate (SOLU-MEDROL) 125 mg/2 mL injection 125 mg (125 mg Intravenous Given 08/19/22 1009)  magnesium sulfate IVPB 2 g 50 mL (0 g Intravenous Stopped 08/19/22 1107)  chlordiazePOXIDE (LIBRIUM) capsule 50 mg (50 mg Oral Given 08/19/22 0927)  amLODipine (NORVASC) tablet 5 mg (5 mg Oral Given 08/19/22 0927)  thiamine (VITAMIN B1) injection 100 mg (100 mg Intravenous Given 08/19/22 1009)  doxycycline (VIBRA-TABS) tablet 100 mg (100 mg Oral Given 08/19/22 0927)  ipratropium-albuterol (DUONEB) 0.5-2.5 (3) MG/3ML nebulizer solution 3 mL (3 mLs Nebulization  Given 08/19/22 1116)    ED Course/ Medical Decision Making/ A&P Clinical Course as of 08/19/22 1239  Fri Aug 19, 2022  1104 Patient's workup has been overall unremarkable and reassuring.  Chest x-ray reviewed by me no pneumonia no pulmonary edema no pneumothorax.  COVID-negative.  Normal white blood cell count 7.1.  Normal hemoglobin 13.6 no anemia.  High since he troponin 5 and reassuring doubt ACS.  Especially no chest pain reported.  Creatinine 1.16.  I-STAT VBG obtained pH 7.3 with no hypercarbia pCO2 42.7. [VB]  1105 Patient reassessed having improved air movement throughout all lung fields.  No significant increased work of breathing.  Feeling some improvement from treatments.  Still wheezing but more air movement.  O2 sat 95 on RA.  Patient supposed to be on intermittent home oxygen and notes having oxygen at home but does not like using it.  At rest he does not require oxygen.  Will consult TOC to help with home health. [VB]    Clinical Course User Index [VB] Mardene Sayer, MD                              Medical Decision Making Eliu Boegel is a 67 y.o. male. 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  Patient symptoms seem most consistent with COPD exacerbation in the setting of suspected possible viral URI versus pneumonia versus medication noncompliance and continued smoking.  Of note, on arrival patient hypertensive 139/102 however in setting of noncompliance, no medication use today and suspected mild withdrawal symptoms with tremulousness on exam.  Ordered for Librium and blood pressure medications.  Denying any chest pain suggestive of ACS, no signs of fluid overload, no focal deficits concerning for CVA. Confirmed no HTN emergency with workup. HTN resolved with home amlodipine and Librium.  DC blood pressure at 123/84  Patient's workup has been overall unremarkable and reassuring.  Chest x-ray reviewed by me no pneumonia no pulmonary edema no pneumothorax.  COVID-negative.  Normal white blood cell count 7.1.  Normal hemoglobin 13.6 no anemia.  High since he troponin 5 and reassuring doubt ACS.  Especially no chest pain reported.  Creatinine 1.16.  I-STAT VBG obtained pH 7.3 with no hypercarbia pCO2 42.7. [VB]  Patient reassessed having improved air movement throughout all lung fields.  No significant increased work of breathing.  Feeling some improvement from treatments.  Still wheezing but more air movement.  O2 sat 95 on RA.  Patient supposed to be on intermittent home oxygen and notes having oxygen at home but does not like using it.  At rest he does not require oxygen.  Will consult TOC to help with home health. [VB]  TOC has confirmed that patient has home health and home oxygen.  He is satting 96% on room air currently at rest.  His repeat troponin was 4.  Overall, he has no  indication for admission at this time as he has home oxygen to use.  I discharged with steroid burst and refills of patient's nebulizers as well as doxycycline for neck 7 days.  Advise close follow-up with PCP and strict return precautions.   Amount and/or Complexity of Data Reviewed Labs: ordered. Radiology: ordered.  Risk Prescription drug management.      Final Clinical Impression(s) / ED Diagnoses Final diagnoses:  COPD  exacerbation (HCC)    Rx / DC Orders ED Discharge Orders          Ordered    predniSONE (DELTASONE) 20 MG tablet  Daily        08/19/22 1226    doxycycline (VIBRAMYCIN) 100 MG capsule  2 times daily        08/19/22 1226    ipratropium-albuterol (DUONEB) 0.5-2.5 (3) MG/3ML SOLN  Every 6 hours PRN        08/19/22 1226    Fluticasone-Umeclidin-Vilant (TRELEGY ELLIPTA) 200-62.5-25 MCG/ACT AEPB  Daily        08/19/22 1226              Mardene Sayer, MD 08/19/22 1239

## 2022-08-25 ENCOUNTER — Other Ambulatory Visit: Payer: Self-pay | Admitting: Pharmacist

## 2022-08-25 ENCOUNTER — Other Ambulatory Visit: Payer: Self-pay | Admitting: Internal Medicine

## 2022-08-25 ENCOUNTER — Other Ambulatory Visit: Payer: Self-pay

## 2022-08-25 DIAGNOSIS — J432 Centrilobular emphysema: Secondary | ICD-10-CM

## 2022-08-25 MED ORDER — ALBUTEROL SULFATE HFA 108 (90 BASE) MCG/ACT IN AERS
2.0000 | INHALATION_SPRAY | RESPIRATORY_TRACT | 3 refills | Status: DC | PRN
Start: 2022-08-25 — End: 2022-11-12
  Filled 2022-08-25 – 2022-08-29 (×3): qty 6.7, 17d supply, fill #0
  Filled 2022-09-13: qty 6.7, 17d supply, fill #1
  Filled 2022-10-10: qty 6.7, 17d supply, fill #2

## 2022-08-29 ENCOUNTER — Other Ambulatory Visit: Payer: Self-pay

## 2022-08-29 NOTE — Progress Notes (Signed)
   Sabin Gibeault May 31, 1955 [id number]  Patient seen by Seward Meth and Mack Guise , PharmD Candidate on 08/29/22.  Blood Pressure Readings: Last documented ambulatory systolic blood pressure: 125 Last documented ambulatory diastolic blood pressure: 78 Systolic BP today: 104 Diastolic BP today: 65   Medication review was performed. Is the patient taking their medications as prescribed?: Yes   The following barriers to adherence were noted: Does the patient have a follow up scheduled with their primary care provider/cardiologist?: Yes   Interventions:  Medications were reviewed  The patient has follow up scheduled:  PCP: Marcine Matar, MD   Seward Meth, Student-PharmD

## 2022-09-07 ENCOUNTER — Inpatient Hospital Stay: Admission: RE | Admit: 2022-09-07 | Payer: Medicare Other | Source: Ambulatory Visit

## 2022-09-13 ENCOUNTER — Ambulatory Visit: Payer: Medicare Other | Attending: Internal Medicine | Admitting: Internal Medicine

## 2022-09-13 ENCOUNTER — Other Ambulatory Visit: Payer: Self-pay

## 2022-09-13 ENCOUNTER — Encounter: Payer: Self-pay | Admitting: Internal Medicine

## 2022-09-13 VITALS — BP 128/80 | HR 69 | Temp 97.9°F | Ht 67.0 in | Wt 121.0 lb

## 2022-09-13 DIAGNOSIS — Z1211 Encounter for screening for malignant neoplasm of colon: Secondary | ICD-10-CM | POA: Diagnosis not present

## 2022-09-13 DIAGNOSIS — J432 Centrilobular emphysema: Secondary | ICD-10-CM

## 2022-09-13 DIAGNOSIS — G4709 Other insomnia: Secondary | ICD-10-CM

## 2022-09-13 DIAGNOSIS — I10 Essential (primary) hypertension: Secondary | ICD-10-CM

## 2022-09-13 DIAGNOSIS — Z23 Encounter for immunization: Secondary | ICD-10-CM

## 2022-09-13 DIAGNOSIS — L720 Epidermal cyst: Secondary | ICD-10-CM | POA: Diagnosis not present

## 2022-09-13 DIAGNOSIS — R040 Epistaxis: Secondary | ICD-10-CM | POA: Diagnosis not present

## 2022-09-13 MED ORDER — ZOSTER VAC RECOMB ADJUVANTED 50 MCG/0.5ML IM SUSR
0.5000 mL | Freq: Once | INTRAMUSCULAR | 0 refills | Status: AC
Start: 2022-09-13 — End: 2022-09-13

## 2022-09-13 NOTE — Progress Notes (Signed)
Patient ID: Jason Wong, male    DOB: 1955/08/05  MRN: [id number]  CC: Hypertension (HTN f/u./Financial issues to obtain inhalers/Intermittent nose bleeds X1 week/Discuss colonoscopy. )   Subjective: Jason Wong is a 67 y.o. male who presents for chronic ds management His concerns today include:  Pt with hx of HTN, tob dep, seasonal allergies, asthma, centrilobular emphysema, insomnia, coronary atherosclerosis, pre-DM, polysub abuse (ETOH, cocaine, drug over dose accidentally 08/2020)  and hep C treated.    COPD:  out of Trelegy and Albuterol since last wk.  Reports he is unable to afford his meds.  Paying down his credit with our pharmacy. Has neb treatments at home.  Using BID.  Gets tired when he does a lot of walking.  Not on home O2 -still free of cigarettes. Referred for CT for lung cancer screening 06/2022 but unable to reach pt.  However he had CTA chest 05/2022 which did not show any nodules  Had RT nose bleed 1 wk ago; last a few minutes.  Stuffed nose with toilet paper to get it to stop.  Use to have nose bleed when he was younger.  Does not pick nose; no humidifier in his room   HTN:  on Norvasc 5 mg daily.  Takes med every morning.  Limits salt in foods.  NO CP HL:  should be on Lipitor 10 mg daily; reports compliance  Reports good results with sleep with Remeron.  Has not increased appetite.    Referred to gastroenterology for colonoscopy.  He was also referred for physical therapy and neurology for peripheral neuropathy.  However they were not successful in reaching him via phone.  Patient states that he lives in a house without the people and they do not answer the phone.  However he does have a cell phone changed that to his primary number on checkout today at the front desk.  Noticed a nodule over the right collarbone x 1-3 weeks.  It is not tender.  Patient Active Problem List   Diagnosis Date Noted   COPD with acute exacerbation (HCC) 06/20/2022   Multifocal  pneumonia 06/20/2022   Hypokalemia 06/20/2022   Acute respiratory failure with hypoxia (HCC) 06/20/2022   Polysubstance abuse (HCC) 05/09/2022   On home oxygen therapy 08/31/2021   Malnutrition of moderate degree 06/16/2021   Acute exacerbation of chronic obstructive pulmonary disease (COPD) (HCC) 06/14/2021   ETOH abuse 05/01/2021   Acute hypoxemic respiratory failure (HCC) 04/30/2021   HLD (hyperlipidemia) 04/30/2021   COPD (chronic obstructive pulmonary disease) with emphysema (HCC) 02/19/2021   COPD exacerbation (HCC) 02/18/2021   Memory deficit 04/28/2020   Hepatitis C virus infection cured after antiviral drug therapy 04/27/2018   Enuresis 04/27/2018   Centrilobular emphysema (HCC) 04/27/2018   Seasonal allergic rhinitis 04/27/2018   Tobacco dependence 08/24/2017   Weight loss, unintentional 08/24/2017   Insomnia 06/27/2017   Prediabetes 01/24/2017   Asthma 06/10/2016   Essential hypertension 06/10/2016   Current every day smoker 06/10/2016     Current Outpatient Medications on File Prior to Visit  Medication Sig Dispense Refill   acetaminophen (TYLENOL) 500 MG tablet Take 500 mg by mouth every 6 (six) hours as needed for moderate pain.     albuterol (PROAIR HFA) 108 (90 Base) MCG/ACT inhaler Inhale 2 puffs into the lungs once every 4 (four) hours as needed for wheezing or shortness of breath. 6.7 g 3   amLODipine (NORVASC) 5 MG tablet Take 1 tablet (5 mg total) by mouth  daily. 90 tablet 3   atorvastatin (LIPITOR) 10 MG tablet Take 1 tablet (10 mg total) by mouth daily. 90 tablet 3   Fluticasone-Umeclidin-Vilant (TRELEGY ELLIPTA) 200-62.5-25 MCG/ACT AEPB Inhale 1 puff into the lungs daily. 14 each 0   Fluticasone-Umeclidin-Vilant (TRELEGY ELLIPTA) 200-62.5-25 MCG/ACT AEPB Inhale 1 puff into the lungs daily. 60 each 5   ipratropium-albuterol (DUONEB) 0.5-2.5 (3) MG/3ML SOLN Take 3 mLs by nebulization every 6 (six) hours as needed (Shortness of breath or wheezing). 360 mL 2    mirtazapine (REMERON) 15 MG tablet Take 0.5 tablets (7.5 mg total) by mouth at bedtime. 90 tablet 1   predniSONE (DELTASONE) 20 MG tablet Take 2 tablets (40 mg total) by mouth daily. 8 tablet 0   thiamine (VITAMIN B-1) 100 MG tablet Take 1 tablet (100 mg total) by mouth daily. 30 tablet 0   No current facility-administered medications on file prior to visit.    Allergies  Allergen Reactions   Tobacco Shortness Of Breath and Other (See Comments)    CIGARETTE SMOKE TRIGGERS THE PATIENT'S RESPIRATORY ISSUES!!    Social History   Socioeconomic History   Marital status: Single    Spouse name: Not on file   Number of children: Not on file   Years of education: Not on file   Highest education level: Not on file  Occupational History   Not on file  Tobacco Use   Smoking status: Former    Packs/day: 0.50    Years: 45.00    Additional pack years: 0.00    Total pack years: 22.50    Types: Cigarettes    Quit date: 02/08/2022    Years since quitting: 0.5   Smokeless tobacco: Never  Vaping Use   Vaping Use: Never used  Substance and Sexual Activity   Alcohol use: Yes    Alcohol/week: 2.0 standard drinks of alcohol    Types: 2 Cans of beer per week    Comment: Tries to stay alcohol free   Drug use: Not Currently    Types: "Crack" cocaine   Sexual activity: Yes    Partners: Female  Other Topics Concern   Not on file  Social History Narrative   Not on file   Social Determinants of Health   Financial Resource Strain: Not on file  Food Insecurity: No Food Insecurity (06/24/2022)   Hunger Vital Sign    Worried About Running Out of Food in the Last Year: Never true    Ran Out of Food in the Last Year: Never true  Transportation Needs: No Transportation Needs (06/24/2022)   PRAPARE - Administrator, Civil Service (Medical): No    Lack of Transportation (Non-Medical): No  Physical Activity: Not on file  Stress: Not on file  Social Connections: Not on file  Intimate  Partner Violence: Not At Risk (06/20/2022)   Humiliation, Afraid, Rape, and Kick questionnaire    Fear of Current or Ex-Partner: No    Emotionally Abused: No    Physically Abused: No    Sexually Abused: No    Family History  Problem Relation Age of Onset   Hypertension Mother    Breast cancer Sister    Hypertension Sister    Colon cancer Brother    Colon polyps Neg Hx    Esophageal cancer Neg Hx    Rectal cancer Neg Hx    Stomach cancer Neg Hx     Past Surgical History:  Procedure Laterality Date   INCISE AND DRAIN ABCESS  dog bite    ROS: Review of Systems Negative except as stated above  PHYSICAL EXAM: BP 128/80   Pulse 69   Temp 97.9 F (36.6 C) (Oral)   Ht 5\' 7"  (1.702 m)   Wt 121 lb (54.9 kg)   SpO2 96%   BMI 18.95 kg/m   Wt Readings from Last 3 Encounters:  09/13/22 121 lb (54.9 kg)  08/19/22 123 lb 7.3 oz (56 kg)  07/20/22 124 lb 6.4 oz (56.4 kg)    Physical Exam   General appearance - alert, well appearing, and in no distress.  He has a cane with him today. Mental status - normal mood, behavior, speech, dress, motor activity, and thought processes Neck - supple, no significant adenopathy Nose: Patient has a lot of nasal hair so it was difficult to visualize inside the nostrils.  However no dried blood noted. Chest -mild scattered wheezing Heart - normal rate, regular rhythm, normal S1, S2, no murmurs, rubs, clicks or gallops Extremities -no lower extremity edema. Skin -has a 1 cm firm but movable subcutaneous nodule of the lateral aspect of the right collarbone.  It is not tender to touch.     Latest Ref Rng & Units 08/19/2022    9:41 AM 08/19/2022    8:51 AM 06/22/2022    4:44 AM  CMP  Glucose 70 - 99 mg/dL  409  811   BUN 8 - 23 mg/dL  11  19   Creatinine 9.14 - 1.24 mg/dL  7.82  9.56   Sodium 213 - 145 mmol/L 140  136  140   Potassium 3.5 - 5.1 mmol/L 4.5  4.4  4.3   Chloride 98 - 111 mmol/L  105  110   CO2 22 - 32 mmol/L  18  21    Calcium 8.9 - 10.3 mg/dL  9.3  8.9   Total Protein 6.5 - 8.1 g/dL  7.7    Total Bilirubin 0.3 - 1.2 mg/dL  0.9    Alkaline Phos 38 - 126 U/L  76    AST 15 - 41 U/L  32    ALT 0 - 44 U/L  20     Lipid Panel     Component Value Date/Time   CHOL 171 09/06/2019 1053   TRIG 57 09/06/2019 1053   HDL 70 09/06/2019 1053   CHOLHDL 2.4 09/06/2019 1053   LDLCALC 90 09/06/2019 1053    CBC    Component Value Date/Time   WBC 7.1 08/19/2022 0851   RBC 4.21 (L) 08/19/2022 0851   HGB 14.6 08/19/2022 0941   HGB 17.2 09/06/2019 1053   HCT 43.0 08/19/2022 0941   HCT 49.9 09/06/2019 1053   PLT 292 08/19/2022 0851   PLT 274 09/06/2019 1053   MCV 101.9 (H) 08/19/2022 0851   MCV 98 (H) 09/06/2019 1053   MCH 32.3 08/19/2022 0851   MCHC 31.7 08/19/2022 0851   RDW 13.8 08/19/2022 0851   RDW 11.5 (L) 09/06/2019 1053   LYMPHSABS 2.6 08/19/2022 0851   LYMPHSABS 2.5 06/13/2016 1110   MONOABS 0.5 08/19/2022 0851   EOSABS 1.0 (H) 08/19/2022 0851   EOSABS 0.1 06/13/2016 1110   BASOSABS 0.1 08/19/2022 0851   BASOSABS 0.0 06/13/2016 1110    ASSESSMENT AND PLAN: 1. Centrilobular emphysema (HCC) Patient to speak with the pharmacy today to see whether they would allow him to further credit refill on Trelegy and albuterol inhaler.  2. Essential hypertension At goal on Norvasc 5 mg daily  3. Right-sided epistaxis Advised to let me know if he has any further episodes so that we can refer to ENT.  Advised to pinch the nose and hold the head back if and when he has another episode.  If bleeding is severe or tracking at the back of the throat, he should be seen in the emergency room.  Most recent CBC showed normal platelet count.  4. Other insomnia Continue Remeron.  5. Epidermal cyst Appears to be a subcutaneous cyst.  Observe for now.  If it increases in size he will let me know.  6. Screening for colon cancer Patient to update his phone contact at the front desk today - Ambulatory referral to  Gastroenterology  7. Need for shingles vaccine Given prescription to take to our pharmacy to get his second Shingrix vaccine. - Zoster Vaccine Adjuvanted [facility]) injection; Inject 0.5 mLs into the muscle once for 1 dose.  Dispense: 0.5 mL; Refill: 0    Patient was given the opportunity to ask questions.  Patient verbalized understanding of the plan and was able to repeat key elements of the plan.   This documentation was completed using Paediatric nurse.  Any transcriptional errors are unintentional.  Orders Placed This Encounter  Procedures   Ambulatory referral to Gastroenterology     Requested Prescriptions   Signed Prescriptions Disp Refills   Zoster Vaccine Adjuvanted [facility]) injection 0.5 mL 0    Sig: Inject 0.5 mLs into the muscle once for 1 dose.    Return in about 4 months (around 01/13/2023) for AWV appt with CMA after 10/08/2022.  Cancel appt with me in July.Jonah Blue, MD, FACP

## 2022-09-30 ENCOUNTER — Ambulatory Visit: Payer: Medicare Other | Admitting: Internal Medicine

## 2022-10-10 ENCOUNTER — Other Ambulatory Visit: Payer: Self-pay

## 2022-10-14 ENCOUNTER — Ambulatory Visit: Payer: Medicare Other | Attending: Internal Medicine

## 2022-10-14 DIAGNOSIS — Z0000 Encounter for general adult medical examination without abnormal findings: Secondary | ICD-10-CM

## 2022-10-20 ENCOUNTER — Other Ambulatory Visit: Payer: Self-pay

## 2022-11-12 ENCOUNTER — Other Ambulatory Visit: Payer: Self-pay

## 2022-11-12 ENCOUNTER — Emergency Department (HOSPITAL_COMMUNITY)
Admission: EM | Admit: 2022-11-12 | Discharge: 2022-11-12 | Disposition: A | Discharge status: Home / Self Care | Payer: Medicare Other | Attending: Emergency Medicine | Admitting: Emergency Medicine

## 2022-11-12 ENCOUNTER — Emergency Department (HOSPITAL_COMMUNITY): Payer: Medicare Other

## 2022-11-12 ENCOUNTER — Encounter (HOSPITAL_COMMUNITY): Payer: Self-pay

## 2022-11-12 DIAGNOSIS — R0602 Shortness of breath: Secondary | ICD-10-CM | POA: Insufficient documentation

## 2022-11-12 DIAGNOSIS — I10 Essential (primary) hypertension: Secondary | ICD-10-CM | POA: Diagnosis not present

## 2022-11-12 DIAGNOSIS — Z79899 Other long term (current) drug therapy: Secondary | ICD-10-CM | POA: Insufficient documentation

## 2022-11-12 DIAGNOSIS — Z1152 Encounter for screening for COVID-19: Secondary | ICD-10-CM | POA: Diagnosis not present

## 2022-11-12 DIAGNOSIS — J449 Chronic obstructive pulmonary disease, unspecified: Secondary | ICD-10-CM | POA: Insufficient documentation

## 2022-11-12 DIAGNOSIS — J432 Centrilobular emphysema: Secondary | ICD-10-CM

## 2022-11-12 DIAGNOSIS — I700 Atherosclerosis of aorta: Secondary | ICD-10-CM | POA: Diagnosis not present

## 2022-11-12 LAB — CBC WITH DIFFERENTIAL/PLATELET
Abs Immature Granulocytes: 0.01 10*3/uL (ref 0.00–0.07)
Basophils Absolute: 0.1 10*3/uL (ref 0.0–0.1)
Basophils Relative: 1 %
Eosinophils Absolute: 0.6 10*3/uL — ABNORMAL HIGH (ref 0.0–0.5)
Eosinophils Relative: 7 %
HCT: 45.8 % (ref 39.0–52.0)
Hemoglobin: 14.9 g/dL (ref 13.0–17.0)
Immature Granulocytes: 0 %
Lymphocytes Relative: 41 %
Lymphs Abs: 3.3 10*3/uL (ref 0.7–4.0)
MCH: 32 pg (ref 26.0–34.0)
MCHC: 32.5 g/dL (ref 30.0–36.0)
MCV: 98.5 fL (ref 80.0–100.0)
Monocytes Absolute: 0.6 10*3/uL (ref 0.1–1.0)
Monocytes Relative: 7 %
Neutro Abs: 3.6 10*3/uL (ref 1.7–7.7)
Neutrophils Relative %: 44 %
Platelets: 303 10*3/uL (ref 150–400)
RBC: 4.65 MIL/uL (ref 4.22–5.81)
RDW: 14.1 % (ref 11.5–15.5)
WBC: 8.1 10*3/uL (ref 4.0–10.5)
nRBC: 0 % (ref 0.0–0.2)

## 2022-11-12 LAB — COMPREHENSIVE METABOLIC PANEL
ALT: 21 U/L (ref 0–44)
AST: 26 U/L (ref 15–41)
Albumin: 4.8 g/dL (ref 3.5–5.0)
Alkaline Phosphatase: 84 U/L (ref 38–126)
Anion gap: 10 (ref 5–15)
BUN: 14 mg/dL (ref 8–23)
CO2: 24 mmol/L (ref 22–32)
Calcium: 10.1 mg/dL (ref 8.9–10.3)
Chloride: 105 mmol/L (ref 98–111)
Creatinine, Ser: 1.06 mg/dL (ref 0.61–1.24)
GFR, Estimated: >60 mL/min (ref >60)
Glucose, Bld: 90 mg/dL (ref 70–99)
Potassium: 4.6 mmol/L (ref 3.5–5.1)
Sodium: 139 mmol/L (ref 135–145)
Total Bilirubin: 0.9 mg/dL (ref 0.3–1.2)
Total Protein: 8.5 g/dL — ABNORMAL HIGH (ref 6.5–8.1)

## 2022-11-12 LAB — TROPONIN I (HIGH SENSITIVITY): Troponin I (High Sensitivity): 5 ng/L (ref <18)

## 2022-11-12 LAB — SARS CORONAVIRUS 2 BY RT PCR: SARS Coronavirus 2 by RT PCR: NEGATIVE

## 2022-11-12 MED ORDER — ALBUTEROL SULFATE HFA 108 (90 BASE) MCG/ACT IN AERS
2.0000 | INHALATION_SPRAY | RESPIRATORY_TRACT | 3 refills | Status: DC | PRN
Start: 2022-11-12 — End: 2022-12-29
  Filled 2022-11-12 – 2022-11-23 (×2): qty 6.7, 17d supply, fill #0

## 2022-11-12 MED ORDER — TRELEGY ELLIPTA 200-62.5-25 MCG/ACT IN AEPB
1.0000 | INHALATION_SPRAY | Freq: Every day | RESPIRATORY_TRACT | 5 refills | Status: DC
Start: 2022-11-12 — End: 2023-02-20
  Filled 2022-11-12 – 2022-11-23 (×2): qty 60, 30d supply, fill #0

## 2022-11-12 MED ORDER — ALBUTEROL SULFATE HFA 108 (90 BASE) MCG/ACT IN AERS
2.0000 | INHALATION_SPRAY | RESPIRATORY_TRACT | Status: DC | PRN
  Administered 2022-11-12: 2 via RESPIRATORY_TRACT
  Filled 2022-11-12: qty 6.7

## 2022-11-12 NOTE — Discharge Instructions (Addendum)
You were seen in the emergency department for your shortness of breath.  Your workup showed no abnormality with your heart or lungs and you can follow-up with your primary doctor to have your symptoms rechecked.  You should refrain from cocaine and other substances prevent worsening of your symptoms.  You should return to the emergency department if you have significantly worsening shortness of breath, severe chest pain or any other new or concerning symptoms.

## 2022-11-12 NOTE — ED Notes (Signed)
Patient transported to X-ray 

## 2022-11-12 NOTE — ED Triage Notes (Signed)
Pt arrived POV from home c/o [facility] that started last night and some indigestion. Pt also admits he did crack and smoked a lot of cigarettes last night.

## 2022-11-12 NOTE — ED Provider Notes (Signed)
[place] EMERGENCY DEPARTMENT AT [facility] Provider Note   CSN: [id number] Arrival date & time: 11/12/22  1017     History  Chief Complaint  Patient presents with   Shortness of Breath    Jason Wong is a 67 y.o. male.  Patient is a 67 year old male with a past medical history of hypertension, COPD, polysubstance use presenting to the emergency department with shortness of breath.  The patient states that he did use cocaine last night for the first time in a while and shortly after started develop shortness of breath.  He reports feeling mostly short of breath on exertion.  He denies any associated chest pain.  He denies any lower extremity swelling.  He denies any fever or cough.  The history is provided by the patient.  Shortness of Breath      Home Medications Prior to Admission medications   Medication Sig Start Date End Date Taking? Authorizing Provider  acetaminophen (TYLENOL) 500 MG tablet Take 500 mg by mouth every 6 (six) hours as needed for moderate pain.    [provider]  albuterol (PROAIR HFA) 108 (90 Base) MCG/ACT inhaler Inhale 2 puffs into the lungs once every 4 (four) hours as needed for wheezing or shortness of breath. 11/12/22   Elayne Snare K, DO  amLODipine (NORVASC) 5 MG tablet Take 1 tablet (5 mg total) by mouth daily. 06/27/22   Claiborne Rigg, NP  atorvastatin (LIPITOR) 10 MG tablet Take 1 tablet (10 mg total) by mouth daily. 06/27/22   Claiborne Rigg, NP  Fluticasone-Umeclidin-Vilant (TRELEGY ELLIPTA) 200-62.5-25 MCG/ACT AEPB Inhale 1 puff into the lungs daily. 07/20/22   Mannam, Colbert Coyer, MD  Fluticasone-Umeclidin-Vilant (TRELEGY ELLIPTA) 200-62.5-25 MCG/ACT AEPB Inhale 1 puff into the lungs daily. 11/12/22   Elayne Snare K, DO  ipratropium-albuterol (DUONEB) 0.5-2.5 (3) MG/3ML SOLN Take 3 mLs by nebulization every 6 (six) hours as needed (Shortness of breath or wheezing). 08/19/22   Mardene Sayer, MD  mirtazapine  (REMERON) 15 MG tablet Take 0.5 tablets (7.5 mg total) by mouth at bedtime. 06/27/22   Claiborne Rigg, NP  predniSONE (DELTASONE) 20 MG tablet Take 2 tablets (40 mg total) by mouth daily. 08/20/22   Mardene Sayer, MD  thiamine (VITAMIN B-1) 100 MG tablet Take 1 tablet (100 mg total) by mouth daily. 06/24/22   Joseph Art, DO      Allergies    Tobacco    Review of Systems   Review of Systems  Respiratory:  Positive for shortness of breath.     Physical Exam Updated Vital Signs BP 120/84   Pulse 71   Temp 97.8 F (36.6 C) (Oral)   Resp 16   Ht 5\' 7"  (1.702 m)   Wt 55.8 kg   SpO2 96%   BMI 19.26 kg/m  Physical Exam Vitals and nursing note reviewed.  Constitutional:      General: He is not in acute distress.    Appearance: He is well-developed.  HENT:     Head: Normocephalic and atraumatic.     Mouth/Throat:     Mouth: Mucous membranes are moist.  Eyes:     Extraocular Movements: Extraocular movements intact.  Neck:     Vascular: No JVD.  Cardiovascular:     Rate and Rhythm: Normal rate and regular rhythm.  Pulmonary:     Effort: Pulmonary effort is normal.     Breath sounds: Normal breath sounds.  Abdominal:     Palpations:  Abdomen is soft.     Tenderness: There is no abdominal tenderness.  Musculoskeletal:        General: Normal range of motion.     Cervical back: Normal range of motion and neck supple.     Right lower leg: No edema.     Left lower leg: No edema.  Skin:    General: Skin is warm and dry.  Neurological:     General: No focal deficit present.     Mental Status: He is alert and oriented to person, place, and time.  Psychiatric:        Mood and Affect: Mood normal.        Behavior: Behavior normal.     ED Results / Procedures / Treatments   Labs (all labs ordered are listed, but only abnormal results are displayed) Labs Reviewed  COMPREHENSIVE METABOLIC PANEL - Abnormal; Notable for the following components:      Result Value   Total  Protein 8.5 (*)    All other components within normal limits  CBC WITH DIFFERENTIAL/PLATELET - Abnormal; Notable for the following components:   Eosinophils Absolute 0.6 (*)    All other components within normal limits  SARS CORONAVIRUS 2 BY RT PCR  TROPONIN I (HIGH SENSITIVITY)    EKG EKG Interpretation Date/Time:  Saturday November 12 2022 10:05:47 EDT Ventricular Rate:  80 PR Interval:  136 QRS Duration:  66 QT Interval:  364 QTC Calculation: 419 R Axis:   88  Text Interpretation: Normal sinus rhythm Right atrial enlargement Borderline ECG No significant change since last tracing Confirmed by Elayne Snare ([id number]) on 11/12/2022 10:59:03 AM  Radiology DG Chest 2 View  Result Date: 11/12/2022 CLINICAL DATA:  67 year old male with history of shortness of breath. EXAM: CHEST - 2 VIEW COMPARISON:  Chest x-ray 08/19/2022. FINDINGS: Lung volumes are normal. No consolidative airspace disease. No pleural effusions. No pneumothorax. No pulmonary nodule or mass noted. Pulmonary vasculature and the cardiomediastinal silhouette are within normal limits. Atherosclerosis in the thoracic aorta. IMPRESSION: 1.  No radiographic evidence of acute cardiopulmonary disease. 2. Aortic atherosclerosis. Electronically Signed   By: Trudie Reed M.D.   On: 11/12/2022 11:24    Procedures Procedures    Medications Ordered in ED Medications  albuterol (VENTOLIN HFA) 108 (90 Base) MCG/ACT inhaler 2 puff (has no administration in time range)    ED Course/ Medical Decision Making/ A&P Clinical Course as of 11/12/22 1410  Sat Nov 12, 2022  1322 Initial troponin negative. Symptoms ongoing since last night so single troponin is sufficient. Remainder of labs and CXR within normal range. COVID swab pending. [VK]    Clinical Course User Index [VK] Rexford Maus, DO                                 Medical Decision Making This patient presents to the ED with chief complaint(s) of SOB with  pertinent past medical history of COPD, HTN, polysubstance use which further complicates the presenting complaint. The complaint involves an extensive differential diagnosis and also carries with it a high risk of complications and morbidity.    The differential diagnosis includes ACS, arrhythmia, anemia, pneumonia, pneumothorax, pulmonary edema, pleural effusion, viral syndrome  Additional history obtained: Additional history obtained from N/A Records reviewed Primary Care Documents  ED Course and Reassessment: Patient's arrival he is hemodynamically stable in no acute distress.  EKG performed on arrival showed normal  sinus rhythm without acute ischemic changes.  The patient will have labs including troponin, chest x-ray and COVID swab performed.  He has no wheezing on exam making a COPD exacerbation unlikely.  He will be closely reassessed.  Independent labs interpretation:  The following labs were independently interpreted: Within normal range  Independent visualization of imaging: - I independently visualized the following imaging with scope of interpretation limited to determining acute life threatening conditions related to emergency care: Chest x-ray, which revealed no acute disease  Consultation: - Consulted or discussed management/test interpretation w/ external professional: N/A  Consideration for admission or further workup: Patient has no emergent conditions requiring admission or further work-up at this time and is stable for discharge home with primary care follow-up  Social Determinants of health: N/A    Amount and/or Complexity of Data Reviewed Labs: ordered. Radiology: ordered.  Risk Prescription drug management.          Final Clinical Impression(s) / ED Diagnoses Final diagnoses:  SOB (shortness of breath)    Rx / DC Orders ED Discharge Orders          Ordered    albuterol [facility] HFA) 108 (90 Base) MCG/ACT inhaler  Every 4 hours PRN         11/12/22 1409    Fluticasone-Umeclidin-Vilant (TRELEGY ELLIPTA) 200-62.5-25 MCG/ACT AEPB  Daily        11/12/22 1409              Rexford Maus, DO 11/12/22 1410

## 2022-11-14 ENCOUNTER — Other Ambulatory Visit: Payer: Self-pay

## 2022-11-17 ENCOUNTER — Other Ambulatory Visit: Payer: Self-pay

## 2022-11-17 ENCOUNTER — Emergency Department (HOSPITAL_COMMUNITY): Payer: Medicare Other

## 2022-11-17 ENCOUNTER — Encounter (HOSPITAL_COMMUNITY): Payer: Self-pay

## 2022-11-17 ENCOUNTER — Emergency Department (HOSPITAL_COMMUNITY)
Admission: EM | Admit: 2022-11-17 | Discharge: 2022-11-17 | Disposition: A | Discharge status: Home / Self Care | Payer: Medicare Other | Attending: Emergency Medicine | Admitting: Emergency Medicine

## 2022-11-17 DIAGNOSIS — J441 Chronic obstructive pulmonary disease with (acute) exacerbation: Secondary | ICD-10-CM | POA: Diagnosis not present

## 2022-11-17 DIAGNOSIS — I10 Essential (primary) hypertension: Secondary | ICD-10-CM | POA: Diagnosis not present

## 2022-11-17 DIAGNOSIS — Z7951 Long term (current) use of inhaled steroids: Secondary | ICD-10-CM | POA: Insufficient documentation

## 2022-11-17 DIAGNOSIS — Z79899 Other long term (current) drug therapy: Secondary | ICD-10-CM | POA: Diagnosis not present

## 2022-11-17 DIAGNOSIS — R0602 Shortness of breath: Secondary | ICD-10-CM

## 2022-11-17 DIAGNOSIS — R0689 Other abnormalities of breathing: Secondary | ICD-10-CM | POA: Diagnosis not present

## 2022-11-17 LAB — BASIC METABOLIC PANEL
Anion gap: 10 (ref 5–15)
BUN: 10 mg/dL (ref 8–23)
CO2: 24 mmol/L (ref 22–32)
Calcium: 9.5 mg/dL (ref 8.9–10.3)
Chloride: 105 mmol/L (ref 98–111)
Creatinine, Ser: 1.08 mg/dL (ref 0.61–1.24)
GFR, Estimated: >60 mL/min (ref >60)
Glucose, Bld: 95 mg/dL (ref 70–99)
Potassium: 4.6 mmol/L (ref 3.5–5.1)
Sodium: 139 mmol/L (ref 135–145)

## 2022-11-17 LAB — CBC
HCT: 43.2 % (ref 39.0–52.0)
Hemoglobin: 14.3 g/dL (ref 13.0–17.0)
MCH: 33.1 pg (ref 26.0–34.0)
MCHC: 33.1 g/dL (ref 30.0–36.0)
MCV: 100 fL (ref 80.0–100.0)
Platelets: 301 10*3/uL (ref 150–400)
RBC: 4.32 MIL/uL (ref 4.22–5.81)
RDW: 13.7 % (ref 11.5–15.5)
WBC: 4.8 10*3/uL (ref 4.0–10.5)
nRBC: 0 % (ref 0.0–0.2)

## 2022-11-17 MED ORDER — AMLODIPINE BESYLATE 5 MG PO TABS
5.0000 mg | ORAL_TABLET | Freq: Every day | ORAL | 0 refills | Status: DC
  Filled 2023-02-20: qty 30, 30d supply, fill #0

## 2022-11-17 MED ORDER — ALBUTEROL SULFATE (2.5 MG/3ML) 0.083% IN NEBU
2.5000 mg | INHALATION_SOLUTION | Freq: Once | RESPIRATORY_TRACT | Status: AC
  Administered 2022-11-17: 2.5 mg via RESPIRATORY_TRACT
  Filled 2022-11-17: qty 3

## 2022-11-17 MED ORDER — IPRATROPIUM-ALBUTEROL 0.5-2.5 (3) MG/3ML IN SOLN
3.0000 mL | Freq: Four times a day (QID) | RESPIRATORY_TRACT | 0 refills | Status: DC | PRN
  Filled 2022-11-23: qty 360, 30d supply, fill #0

## 2022-11-17 MED ORDER — METHYLPREDNISOLONE SODIUM SUCC 125 MG IJ SOLR
125.0000 mg | Freq: Once | INTRAMUSCULAR | Status: AC
  Administered 2022-11-17: 125 mg via INTRAVENOUS
  Filled 2022-11-17: qty 2

## 2022-11-17 MED ORDER — TRELEGY ELLIPTA 200-62.5-25 MCG/ACT IN AEPB
1.0000 | INHALATION_SPRAY | Freq: Every day | RESPIRATORY_TRACT | 0 refills | Status: DC
  Filled 2022-12-29: qty 60, 30d supply, fill #0

## 2022-11-17 MED ORDER — ALBUTEROL SULFATE HFA 108 (90 BASE) MCG/ACT IN AERS
2.0000 | INHALATION_SPRAY | RESPIRATORY_TRACT | Status: DC | PRN
  Administered 2022-11-17: 2 via RESPIRATORY_TRACT
  Filled 2022-11-17 (×2): qty 6.7

## 2022-11-17 MED ORDER — IPRATROPIUM-ALBUTEROL 0.5-2.5 (3) MG/3ML IN SOLN
3.0000 mL | Freq: Once | RESPIRATORY_TRACT | Status: AC
  Administered 2022-11-17: 3 mL via RESPIRATORY_TRACT
  Filled 2022-11-17: qty 3

## 2022-11-17 NOTE — ED Triage Notes (Signed)
Patient reports his COPD is flaring up and has been able to get his inhalers.  Patient denies pain.  Denies coughing up anything.

## 2022-11-17 NOTE — ED Notes (Signed)
Pt O2 while ambulating 93-94%

## 2022-11-17 NOTE — Discharge Instructions (Addendum)
You are seen in the emergency apartment for shortness of breath and cough.  As we discussed your blood work and chest x-ray looked reassuring.  We gave you an inhaler and 2 breathing treatments, as well as some steroid medication.  I hope this continues to help your breathing.  I printed prescriptions for your blood pressure medication, your trilogy inhaler, and DuoNeb nebulizer solution that you can take to what ever pharmacy you prefer.  Your other prescriptions were sent to the [facility] health community pharmacy, please try to fill these when you can.  Continue to monitor how you're doing and return to the ER for new or worsening symptoms.

## 2022-11-17 NOTE — ED Provider Notes (Signed)
[place] EMERGENCY DEPARTMENT AT [facility] Provider Note   CSN: [id number] Arrival date & time: 11/17/22  7829     History  Chief Complaint  Patient presents with   Shortness of Breath    Jason Wong is a 67 y.o. male with history of asthma, hypertension, hepatitis C, COPD, hyperlipidemia, polysubstance use disorder who presents to the emergency department complaining of flare of his COPD.  Patient's been having more shortness of breath, wheezing, nonproductive cough for the past few days.  States that he was seen in the ER over the weekend, and shortly after being discharged his symptoms worsened again.  His breathing is especially worse with ambulating. He has not been able to fill his inhalers, did run out of them about 2 to 3 days ago.  Denies any fever or chest pain.   Shortness of Breath Associated symptoms: cough and wheezing        Home Medications Prior to Admission medications   Medication Sig Start Date End Date Taking? Authorizing Provider  amLODipine (NORVASC) 5 MG tablet Take 1 tablet (5 mg total) by mouth daily. 11/17/22  Yes Shadiyah Wernli T, PA-C  Fluticasone-Umeclidin-Vilant (TRELEGY ELLIPTA) 200-62.5-25 MCG/ACT AEPB Inhale 1 puff into the lungs daily. 11/17/22  Yes Wataru Mccowen T, PA-C  ipratropium-albuterol (DUONEB) 0.5-2.5 (3) MG/3ML SOLN Take 3 mLs by nebulization every 6 (six) hours as needed. 11/17/22  Yes Lyndal Reggio T, PA-C  acetaminophen (TYLENOL) 500 MG tablet Take 500 mg by mouth every 6 (six) hours as needed for moderate pain.    [provider]  albuterol (PROAIR HFA) 108 (90 Base) MCG/ACT inhaler Inhale 2 puffs into the lungs once every 4 (four) hours as needed for wheezing or shortness of breath. 11/12/22   Elayne Snare K, DO  amLODipine (NORVASC) 5 MG tablet Take 1 tablet (5 mg total) by mouth daily. 06/27/22   Claiborne Rigg, NP  atorvastatin (LIPITOR) 10 MG tablet Take 1 tablet (10 mg total) by mouth daily.  06/27/22   Claiborne Rigg, NP  Fluticasone-Umeclidin-Vilant (TRELEGY ELLIPTA) 200-62.5-25 MCG/ACT AEPB Inhale 1 puff into the lungs daily. 11/12/22   Elayne Snare K, DO  mirtazapine (REMERON) 15 MG tablet Take 0.5 tablets (7.5 mg total) by mouth at bedtime. 06/27/22   Claiborne Rigg, NP  predniSONE (DELTASONE) 20 MG tablet Take 2 tablets (40 mg total) by mouth daily. 08/20/22   Mardene Sayer, MD  thiamine (VITAMIN B-1) 100 MG tablet Take 1 tablet (100 mg total) by mouth daily. 06/24/22   Joseph Art, DO      Allergies    Tobacco    Review of Systems   Review of Systems  Respiratory:  Positive for cough, shortness of breath and wheezing.   All other systems reviewed and are negative.   Physical Exam Updated Vital Signs BP (!) 143/100   Pulse 84   Temp (!) 97.5 F (36.4 C) (Axillary)   Resp (!) 23   Ht 5\' 7"  (1.702 m)   Wt 55.8 kg   SpO2 94%   BMI 19.26 kg/m  Physical Exam Vitals and nursing note reviewed.  Constitutional:      Appearance: Normal appearance.  HENT:     Head: Normocephalic and atraumatic.  Eyes:     Conjunctiva/sclera: Conjunctivae normal.  Cardiovascular:     Rate and Rhythm: Normal rate and regular rhythm.  Pulmonary:     Effort: No respiratory distress.     Breath sounds: Wheezing present.  Comments: Expiratory wheezing in all lung fields.  Good air movement to the bases.  Mildly increased respiratory effort, without distress.  On 2 L nasal cannula, turned this off and he maintained normal oxygen saturations Abdominal:     General: There is no distension.     Palpations: Abdomen is soft.     Tenderness: There is no abdominal tenderness.  Skin:    General: Skin is warm and dry.  Neurological:     General: No focal deficit present.     Mental Status: He is alert.     ED Results / Procedures / Treatments   Labs (all labs ordered are listed, but only abnormal results are displayed) Labs Reviewed  BASIC METABOLIC PANEL  CBC     EKG EKG Interpretation Date/Time:  Thursday November 17 2022 17:11:57 EDT Ventricular Rate:  90 PR Interval:  133 QRS Duration:  90 QT Interval:  344 QTC Calculation: 421 R Axis:   84  Text Interpretation: Sinus rhythm Consider right atrial enlargement Borderline right axis deviation No significant change since last tracing Confirmed by Gwyneth Sprout ([id number]) on 11/17/2022 5:17:52 PM  Radiology DG Chest 2 View  Result Date: 11/17/2022 CLINICAL DATA:  Difficulty breathing. No pain or cough. History of COPD. EXAM: CHEST - 2 VIEW COMPARISON:  11/12/2022. FINDINGS: Bilateral lung fields are clear. Bilateral costophrenic angles are clear. Normal cardio-mediastinal silhouette. No acute osseous abnormalities. The soft tissues are within normal limits. IMPRESSION: No active cardiopulmonary disease. Electronically Signed   By: Jules Schick M.D.   On: 11/17/2022 09:37    Procedures Procedures    Medications Ordered in ED Medications  albuterol (VENTOLIN HFA) 108 (90 Base) MCG/ACT inhaler 2 puff (2 puffs Inhalation Given 11/17/22 1715)  methylPREDNISolone sodium succinate (SOLU-MEDROL) 125 mg/2 mL injection 125 mg (125 mg Intravenous Given 11/17/22 1802)  ipratropium-albuterol (DUONEB) 0.5-2.5 (3) MG/3ML nebulizer solution 3 mL (3 mLs Nebulization Given 11/17/22 1800)  albuterol (PROVENTIL) (2.5 MG/3ML) 0.083% nebulizer solution 2.5 mg (2.5 mg Nebulization Given 11/17/22 1951)    ED Course/ Medical Decision Making/ A&P                                 Medical Decision Making Amount and/or Complexity of Data Reviewed Labs: ordered. Radiology: ordered.  Risk Prescription drug management.   This patient is a 67 y.o. male  who presents to the ED for concern of shortness of breath, wheezing, and cough.   Differential diagnoses prior to evaluation: The emergent differential diagnosis includes, but is not limited to,  COPD, CHF, pneumonia, PE, pneumothorax, ACS/MI. This is not an  exhaustive differential.   Past Medical History / Co-morbidities / Social History: asthma, hypertension, hepatitis C, COPD, hyperlipidemia  Additional history: Chart reviewed. Pertinent results include: Patient admitted in March of this year for COPD exacerbation.  Also reviewed ER visit note from 8/24, patient had presented complaining of shortness of breath after using cocaine.  He was given albuterol inhaler, had reassuring laboratory evaluation with negative troponins, and was discharged in stable condition.  He was discharged with albuterol inhaler and Trelegy, which he has not been able to pick up.  Physical Exam: Physical exam performed. The pertinent findings include: Hypertensive, otherwise normal vital signs.  Patient was on 2 L nasal cannula while I was in the room, but I did turn it off and he maintained normal oxygen saturations while resting in the bed.  Heart regular  rate and rhythm, expiratory wheezing in all lung fields.  Mildly increased respiratory effort. Maintained O2 saturation at 93-94% while ambulating.  Lab Tests/Imaging studies: I personally interpreted labs/imaging and the pertinent results include: CBC and BMP normal.  Chest x-ray without acute abnormalities.. I agree with the radiologist interpretation.  Cardiac monitoring: EKG obtained and interpreted by myself and attending physician which shows: Sinus rhythm   Medications: I ordered medication including DuoNeb, Solu-Medrol.  I have reviewed the patients home medicines and have made adjustments as needed.   Disposition: After consideration of the diagnostic results and the patients response to treatment, I feel that emergency department workup does not suggest an emergent condition requiring admission or immediate intervention beyond what has been performed at this time. The plan is: discharge to home with ongoing symptomatic management of COPD exacerbation. Patient requested printed prescriptions for medications.  Sent prescriptions for trelegy, duonebs, and refill of his BP medication. No increased sputum or concern for pneumonia, so will defer antibiotics at this time. The patient is safe for discharge and has been instructed to return immediately for worsening symptoms, change in symptoms or any other concerns.  Final Clinical Impression(s) / ED Diagnoses Final diagnoses:  Shortness of breath  COPD exacerbation (HCC)    Rx / DC Orders ED Discharge Orders          Ordered    amLODipine (NORVASC) 5 MG tablet  Daily        11/17/22 2045    Fluticasone-Umeclidin-Vilant (TRELEGY ELLIPTA) 200-62.5-25 MCG/ACT AEPB  Daily        11/17/22 2045    ipratropium-albuterol (DUONEB) 0.5-2.5 (3) MG/3ML SOLN  Every 6 hours PRN        11/17/22 2045           Portions of this report may have been transcribed using voice recognition software. Every effort was made to ensure accuracy; however, inadvertent computerized transcription errors may be present.    Jeanella Flattery 11/17/22 2303    Gwyneth Sprout, MD 11/18/22 [phone]

## 2022-11-23 ENCOUNTER — Other Ambulatory Visit: Payer: Self-pay

## 2022-11-24 ENCOUNTER — Telehealth: Payer: Self-pay | Admitting: *Deleted

## 2022-11-24 NOTE — Progress Notes (Signed)
\   Care Coordination  Outreach Note  11/24/2022 Name: Jason Wong MRN: [id number] DOB: 05-02-1955   Care Coordination Outreach Attempts: An unsuccessful telephone outreach was attempted today to offer the patient information about available care coordination services.  Follow Up Plan:  Additional outreach attempts will be made to offer the patient care coordination information and services.   Encounter Outcome:  No Answer  Christie Nottingham  Care Coordination Care Guide  Direct Dial: [phone]

## 2022-11-25 NOTE — Progress Notes (Unsigned)
  Care Coordination  Outreach Note  11/25/2022 Name: Ritwik Lapinsky MRN: [id number] DOB: 12/15/1955   Care Coordination Outreach Attempts: A second unsuccessful outreach was attempted today to offer the patient with information about available care coordination services.  Follow Up Plan:  Additional outreach attempts will be made to offer the patient care coordination information and services.   Encounter Outcome:  No Answer  Christie Nottingham  Care Coordination Care Guide  Direct Dial: [phone]

## 2022-11-29 NOTE — Progress Notes (Signed)
  Care Coordination  Outreach Note  11/29/2022 Name: Jason Wong MRN: [id number] DOB: August 31, 1955   Care Coordination Outreach Attempts: A third unsuccessful outreach was attempted today to offer the patient with information about available care coordination services.  Follow Up Plan:  No further outreach attempts will be made at this time. We have been unable to contact the patient to offer or enroll patient in care coordination services  Encounter Outcome:  No Answer  Gwenevere Ghazi  Care Coordination Care Guide  Direct Dial: [phone]

## 2022-11-30 ENCOUNTER — Other Ambulatory Visit: Payer: Self-pay

## 2022-12-29 ENCOUNTER — Encounter (HOSPITAL_COMMUNITY): Payer: Self-pay

## 2022-12-29 ENCOUNTER — Other Ambulatory Visit: Payer: Self-pay

## 2022-12-29 ENCOUNTER — Emergency Department (HOSPITAL_COMMUNITY): Payer: Medicare Other

## 2022-12-29 ENCOUNTER — Emergency Department (HOSPITAL_COMMUNITY)
Admission: EM | Admit: 2022-12-29 | Discharge: 2022-12-29 | Disposition: A | Discharge status: Home / Self Care | Payer: Medicare Other | Attending: Emergency Medicine | Admitting: Emergency Medicine

## 2022-12-29 DIAGNOSIS — Z1152 Encounter for screening for COVID-19: Secondary | ICD-10-CM | POA: Diagnosis not present

## 2022-12-29 DIAGNOSIS — R0602 Shortness of breath: Secondary | ICD-10-CM | POA: Diagnosis present

## 2022-12-29 DIAGNOSIS — J449 Chronic obstructive pulmonary disease, unspecified: Secondary | ICD-10-CM | POA: Insufficient documentation

## 2022-12-29 DIAGNOSIS — Z79899 Other long term (current) drug therapy: Secondary | ICD-10-CM | POA: Diagnosis not present

## 2022-12-29 DIAGNOSIS — J432 Centrilobular emphysema: Secondary | ICD-10-CM

## 2022-12-29 DIAGNOSIS — J441 Chronic obstructive pulmonary disease with (acute) exacerbation: Secondary | ICD-10-CM | POA: Diagnosis not present

## 2022-12-29 LAB — CBC WITH DIFFERENTIAL/PLATELET
Abs Immature Granulocytes: 0 10*3/uL (ref 0.00–0.07)
Basophils Absolute: 0.1 10*3/uL (ref 0.0–0.1)
Basophils Relative: 2 %
Eosinophils Absolute: 0.7 10*3/uL — ABNORMAL HIGH (ref 0.0–0.5)
Eosinophils Relative: 13 %
HCT: 42.3 % (ref 39.0–52.0)
Hemoglobin: 14 g/dL (ref 13.0–17.0)
Immature Granulocytes: 0 %
Lymphocytes Relative: 48 %
Lymphs Abs: 2.7 10*3/uL (ref 0.7–4.0)
MCH: 32 pg (ref 26.0–34.0)
MCHC: 33.1 g/dL (ref 30.0–36.0)
MCV: 96.8 fL (ref 80.0–100.0)
Monocytes Absolute: 0.4 10*3/uL (ref 0.1–1.0)
Monocytes Relative: 7 %
Neutro Abs: 1.7 10*3/uL (ref 1.7–7.7)
Neutrophils Relative %: 30 %
Platelets: 299 10*3/uL (ref 150–400)
RBC: 4.37 MIL/uL (ref 4.22–5.81)
RDW: 13.6 % (ref 11.5–15.5)
WBC: 5.7 10*3/uL (ref 4.0–10.5)
nRBC: 0 % (ref 0.0–0.2)

## 2022-12-29 LAB — BASIC METABOLIC PANEL
Anion gap: 15 (ref 5–15)
BUN: 14 mg/dL (ref 8–23)
CO2: 22 mmol/L (ref 22–32)
Calcium: 10.1 mg/dL (ref 8.9–10.3)
Chloride: 105 mmol/L (ref 98–111)
Creatinine, Ser: 1.12 mg/dL (ref 0.61–1.24)
GFR, Estimated: >60 mL/min (ref >60)
Glucose, Bld: 84 mg/dL (ref 70–99)
Potassium: 4.3 mmol/L (ref 3.5–5.1)
Sodium: 142 mmol/L (ref 135–145)

## 2022-12-29 LAB — TROPONIN I (HIGH SENSITIVITY): Troponin I (High Sensitivity): 5 ng/L (ref <18)

## 2022-12-29 LAB — BRAIN NATRIURETIC PEPTIDE: B Natriuretic Peptide: 44 pg/mL (ref 0.0–100.0)

## 2022-12-29 LAB — SARS CORONAVIRUS 2 BY RT PCR: SARS Coronavirus 2 by RT PCR: NEGATIVE

## 2022-12-29 MED ORDER — AMOXICILLIN-POT CLAVULANATE 875-125 MG PO TABS
1.0000 | ORAL_TABLET | Freq: Two times a day (BID) | ORAL | 0 refills | Status: DC
Start: 2022-12-29 — End: 2023-01-23
  Filled 2022-12-29: qty 14, 7d supply, fill #0

## 2022-12-29 MED ORDER — DEXAMETHASONE 10 MG/ML FOR PEDIATRIC ORAL USE
10.0000 mg | Freq: Once | INTRAMUSCULAR | Status: DC
  Filled 2022-12-29: qty 1

## 2022-12-29 MED ORDER — IPRATROPIUM-ALBUTEROL 0.5-2.5 (3) MG/3ML IN SOLN
3.0000 mL | Freq: Once | RESPIRATORY_TRACT | Status: AC
  Administered 2022-12-29: 3 mL via RESPIRATORY_TRACT
  Filled 2022-12-29: qty 3

## 2022-12-29 MED ORDER — PREDNISONE 20 MG PO TABS
60.0000 mg | ORAL_TABLET | Freq: Every day | ORAL | 0 refills | Status: AC
  Filled 2022-12-29: qty 15, 5d supply, fill #0

## 2022-12-29 MED ORDER — ALBUTEROL SULFATE HFA 108 (90 BASE) MCG/ACT IN AERS
1.0000 | INHALATION_SPRAY | RESPIRATORY_TRACT | Status: DC | PRN
  Administered 2022-12-29: 1 via RESPIRATORY_TRACT
  Filled 2022-12-29: qty 6.7

## 2022-12-29 MED ORDER — ALBUTEROL SULFATE HFA 108 (90 BASE) MCG/ACT IN AERS
2.0000 | INHALATION_SPRAY | RESPIRATORY_TRACT | 0 refills | Status: DC | PRN
  Filled 2022-12-29: qty 6.7, 17d supply, fill #0

## 2022-12-29 MED ORDER — DEXAMETHASONE SODIUM PHOSPHATE 10 MG/ML IJ SOLN
10.0000 mg | Freq: Once | INTRAMUSCULAR | Status: AC
  Administered 2022-12-29: 10 mg

## 2022-12-29 NOTE — ED Triage Notes (Signed)
Pt c/o SOBx3d. Pt denies any other sx.

## 2022-12-29 NOTE — ED Provider Triage Note (Signed)
Emergency Medicine Provider Triage Evaluation Note  Jason Wong , a 67 y.o. male  was evaluated in triage.  Pt complains of DIB. Hx copd, ran out of inhalers around week ago, couldn't get 2/2 cost. DIB over last 3 days, mild cough not productive which is not worse than prior. Exertional dyspnea, wheezing. No CP or n/v, no palpitations, no fevers/chills or travel.  Review of Systems  Positive: Cough, dib Negative: Fever cp  Physical Exam  BP (!) 147/101   Pulse 74   Temp 98 F (36.7 C) (Oral)   Resp 18   Ht 5\' 7"  (1.702 m)   Wt 55.8 kg   SpO2 98%   BMI 19.27 kg/m  Gen:   Awake, no distress   Resp:  Normal effort, trace wheezing b/l MSK:   Moves extremities without difficulty, no LE edema Other:  No jaundice, no [facility]  Medical Decision Making  Medically screening exam initiated at 9:12 AM.  Appropriate orders placed.  Mykle Pascua was informed that the remainder of the evaluation will be completed by another provider, this initial triage assessment does not replace that evaluation, and the importance of remaining in the ED until their evaluation is complete.  Pt here w/ dib, ran out of home meds. Screening labs and XR ordered. Duoneb/decadron. Albuterol MDI.    Sloan Leiter, DO 12/29/22 [phone]

## 2022-12-29 NOTE — Discharge Instructions (Addendum)
Your exam was reassuring.  Blood work was all reassuring.  Chest x-ray without evidence of pneumonia.  You likely have a COPD exacerbation.  I sent prednisone, antibiotic into the pharmacy for you.  You received albuterol inhaler prior to discharge.  Have also sent in a refill into the pharmacy for you.  For any concerning symptoms return to the emergency room otherwise follow-up with your primary care provider.

## 2022-12-29 NOTE — ED Provider Notes (Signed)
[place] EMERGENCY DEPARTMENT AT [facility] Provider Note   CSN: [id number] Arrival date & time: 12/29/22  6578     History  No chief complaint on file.   Jason Wong is a 67 y.o. male.  67 year old male with past medical history significant for COPD presents today for 3-days worsening of his shortness of breath and wheezing.  He states he ran out of his albuterol inhalers.  No chest pain.  Does not use baseline supplemental O2.  He was given Solu-Medrol, breathing treatment through the triage.  Reports some improvement in his breathing.  Denies fever.  The history is provided by the patient. No language interpreter was used.       Home Medications Prior to Admission medications   Medication Sig Start Date End Date Taking? Authorizing Provider  acetaminophen (TYLENOL) 500 MG tablet Take 500 mg by mouth every 6 (six) hours as needed for moderate pain.    [provider]  albuterol (PROAIR HFA) 108 (90 Base) MCG/ACT inhaler Inhale 2 puffs into the lungs once every 4 (four) hours as needed for wheezing or shortness of breath. 11/12/22   Elayne Snare K, DO  amLODipine (NORVASC) 5 MG tablet Take 1 tablet (5 mg total) by mouth daily. 06/27/22   Claiborne Rigg, NP  amLODipine (NORVASC) 5 MG tablet Take 1 tablet (5 mg total) by mouth daily. 11/17/22   Roemhildt, Lorin T, PA-C  atorvastatin (LIPITOR) 10 MG tablet Take 1 tablet (10 mg total) by mouth daily. 06/27/22   Claiborne Rigg, NP  Fluticasone-Umeclidin-Vilant (TRELEGY ELLIPTA) 200-62.5-25 MCG/ACT AEPB Inhale 1 puff into the lungs daily. 11/12/22   Rexford Maus, DO  Fluticasone-Umeclidin-Vilant (TRELEGY ELLIPTA) 200-62.5-25 MCG/ACT AEPB Inhale 1 puff into the lungs daily. 11/17/22   Roemhildt, Lorin T, PA-C  ipratropium-albuterol (DUONEB) 0.5-2.5 (3) MG/3ML SOLN Take 3 mLs by nebulization every 6 (six) hours as needed. 11/17/22   Roemhildt, Lorin T, PA-C  mirtazapine (REMERON) 15 MG tablet Take 0.5  tablets (7.5 mg total) by mouth at bedtime. 06/27/22   Claiborne Rigg, NP  predniSONE (DELTASONE) 20 MG tablet Take 2 tablets (40 mg total) by mouth daily. 08/20/22   Mardene Sayer, MD  thiamine (VITAMIN B-1) 100 MG tablet Take 1 tablet (100 mg total) by mouth daily. 06/24/22   Joseph Art, DO      Allergies    Tobacco    Review of Systems   Review of Systems  Constitutional:  Negative for chills and fever.  Respiratory:  Positive for cough and shortness of breath.   Cardiovascular:  Negative for chest pain.  Gastrointestinal:  Negative for abdominal pain.  Neurological:  Negative for light-headedness.  All other systems reviewed and are negative.   Physical Exam Updated Vital Signs BP (!) 152/98   Pulse 72   Temp 98.1 F (36.7 C) (Oral)   Resp 20   Ht 5\' 7"  (1.702 m)   Wt 55.8 kg   SpO2 100%   BMI 19.27 kg/m  Physical Exam Vitals and nursing note reviewed.  Constitutional:      General: He is not in acute distress.    Appearance: Normal appearance. He is not ill-appearing.  HENT:     Head: Normocephalic and atraumatic.     Nose: Nose normal.  Eyes:     General: No scleral icterus.    Extraocular Movements: Extraocular movements intact.     Conjunctiva/sclera: Conjunctivae normal.  Cardiovascular:     Rate and  Rhythm: Normal rate and regular rhythm.     Heart sounds: Normal heart sounds.  Pulmonary:     Effort: Pulmonary effort is normal. No respiratory distress.     Breath sounds: Normal breath sounds. No wheezing or rales.  Abdominal:     General: There is no distension.     Tenderness: There is no abdominal tenderness.  Musculoskeletal:        General: Normal range of motion.     Cervical back: Normal range of motion.     Right lower leg: No edema.     Left lower leg: No edema.  Skin:    General: Skin is warm and dry.  Neurological:     General: No focal deficit present.     Mental Status: He is alert. Mental status is at baseline.     ED  Results / Procedures / Treatments   Labs (all labs ordered are listed, but only abnormal results are displayed) Labs Reviewed  CBC WITH DIFFERENTIAL/PLATELET - Abnormal; Notable for the following components:      Result Value   Eosinophils Absolute 0.7 (*)    All other components within normal limits  SARS CORONAVIRUS 2 BY RT PCR  BASIC METABOLIC PANEL  BRAIN NATRIURETIC PEPTIDE  TROPONIN I (HIGH SENSITIVITY)  TROPONIN I (HIGH SENSITIVITY)    EKG None  Radiology DG Chest 2 View  Result Date: 12/29/2022 CLINICAL DATA:  Shortness of breath. EXAM: CHEST - 2 VIEW COMPARISON:  November 17, 2022. FINDINGS: The heart size and mediastinal contours are within normal limits. Both lungs are clear. The visualized skeletal structures are unremarkable. IMPRESSION: No active cardiopulmonary disease. Electronically Signed   By: Lupita Raider M.D.   On: 12/29/2022 10:43    Procedures Procedures    Medications Ordered in ED Medications  albuterol (VENTOLIN HFA) 108 (90 Base) MCG/ACT inhaler 1-2 puff (1 puff Inhalation Given 12/29/22 1437)  ipratropium-albuterol (DUONEB) 0.5-2.5 (3) MG/3ML nebulizer solution 3 mL (3 mLs Nebulization Given 12/29/22 0913)  dexamethasone (DECADRON) injection 10 mg (10 mg Other Given 12/29/22 1610)    ED Course/ Medical Decision Making/ A&P                                 Medical Decision Making  67 year old male presents today for concern of shortness of breath.  Has history of COPD.  He was given DuoNeb, Decadron in triage.  He has had improvement.  I ambulated patient in the halls approximately 100 feet.  He maintained saturations of 97% or above.  Lung sounds are clear.  CBC is unremarkable.  BMP is unremarkable.  Troponin initially 5.  BNP within normal.  COVID-negative.  Chest x-ray without acute cardiopulmonary process.  EKG without acute ischemic changes.  Low suspicion for ACS.  Likely COPD exacerbation.  Will start patient on Augmentin, and provide  short course of steroids.  Albuterol refilled.  Patient is stable for discharge.  Discharged in stable condition.  Return precautions discussed.    Admission considered however patient hemodynamically stable, and symptoms improved.   Final Clinical Impression(s) / ED Diagnoses Final diagnoses:  Chronic obstructive pulmonary disease, unspecified COPD type (HCC)    Rx / DC Orders ED Discharge Orders          Ordered    amoxicillin-clavulanate (AUGMENTIN) 875-125 MG tablet  Every 12 hours        12/29/22 1450    predniSONE (DELTASONE)  20 MG tablet  Daily with breakfast        12/29/22 1450              Marita Kansas, PA-C 12/29/22 1452    Alvira Monday, MD 12/29/22 2237

## 2023-01-10 ENCOUNTER — Other Ambulatory Visit: Payer: Self-pay

## 2023-01-13 ENCOUNTER — Ambulatory Visit: Payer: Self-pay | Admitting: Internal Medicine

## 2023-01-17 ENCOUNTER — Other Ambulatory Visit: Payer: Self-pay

## 2023-01-17 ENCOUNTER — Ambulatory Visit: Payer: Medicare Other | Attending: Internal Medicine

## 2023-01-17 ENCOUNTER — Encounter (HOSPITAL_COMMUNITY): Payer: Self-pay

## 2023-01-17 ENCOUNTER — Emergency Department (HOSPITAL_COMMUNITY)
Admission: EM | Admit: 2023-01-17 | Discharge: 2023-01-17 | Disposition: A | Discharge status: Home / Self Care | Payer: Medicare Other | Attending: Emergency Medicine | Admitting: Emergency Medicine

## 2023-01-17 ENCOUNTER — Emergency Department (HOSPITAL_COMMUNITY): Payer: Medicare Other

## 2023-01-17 DIAGNOSIS — R1013 Epigastric pain: Secondary | ICD-10-CM

## 2023-01-17 DIAGNOSIS — I10 Essential (primary) hypertension: Secondary | ICD-10-CM | POA: Diagnosis not present

## 2023-01-17 DIAGNOSIS — K2970 Gastritis, unspecified, without bleeding: Secondary | ICD-10-CM | POA: Insufficient documentation

## 2023-01-17 DIAGNOSIS — J449 Chronic obstructive pulmonary disease, unspecified: Secondary | ICD-10-CM | POA: Insufficient documentation

## 2023-01-17 DIAGNOSIS — Z7951 Long term (current) use of inhaled steroids: Secondary | ICD-10-CM | POA: Insufficient documentation

## 2023-01-17 DIAGNOSIS — Z79899 Other long term (current) drug therapy: Secondary | ICD-10-CM | POA: Insufficient documentation

## 2023-01-17 DIAGNOSIS — K429 Umbilical hernia without obstruction or gangrene: Secondary | ICD-10-CM | POA: Diagnosis not present

## 2023-01-17 DIAGNOSIS — R1033 Periumbilical pain: Secondary | ICD-10-CM | POA: Diagnosis not present

## 2023-01-17 LAB — COMPREHENSIVE METABOLIC PANEL
ALT: 24 U/L (ref 0–44)
AST: 28 U/L (ref 15–41)
Albumin: 3.7 g/dL (ref 3.5–5.0)
Alkaline Phosphatase: 63 U/L (ref 38–126)
Anion gap: 11 (ref 5–15)
BUN: 14 mg/dL (ref 8–23)
CO2: 21 mmol/L — ABNORMAL LOW (ref 22–32)
Calcium: 9.3 mg/dL (ref 8.9–10.3)
Chloride: 108 mmol/L (ref 98–111)
Creatinine, Ser: 1.2 mg/dL (ref 0.61–1.24)
GFR, Estimated: >60 mL/min (ref >60)
Glucose, Bld: 104 mg/dL — ABNORMAL HIGH (ref 70–99)
Potassium: 3.8 mmol/L (ref 3.5–5.1)
Sodium: 140 mmol/L (ref 135–145)
Total Bilirubin: 0.7 mg/dL (ref 0.3–1.2)
Total Protein: 6.8 g/dL (ref 6.5–8.1)

## 2023-01-17 LAB — CBC
HCT: 41.2 % (ref 39.0–52.0)
Hemoglobin: 14 g/dL (ref 13.0–17.0)
MCH: 32.3 pg (ref 26.0–34.0)
MCHC: 34 g/dL (ref 30.0–36.0)
MCV: 95.2 fL (ref 80.0–100.0)
Platelets: 268 10*3/uL (ref 150–400)
RBC: 4.33 MIL/uL (ref 4.22–5.81)
RDW: 13 % (ref 11.5–15.5)
WBC: 7.2 10*3/uL (ref 4.0–10.5)
nRBC: 0 % (ref 0.0–0.2)

## 2023-01-17 LAB — TROPONIN I (HIGH SENSITIVITY): Troponin I (High Sensitivity): 4 ng/L (ref <18)

## 2023-01-17 LAB — LIPASE, BLOOD: Lipase: 28 U/L (ref 11–51)

## 2023-01-17 MED ORDER — FAMOTIDINE 20 MG PO TABS
20.0000 mg | ORAL_TABLET | Freq: Two times a day (BID) | ORAL | 0 refills | Status: DC
Start: 2023-01-17 — End: 2023-03-29
  Filled 2023-01-17: qty 30, 15d supply, fill #0

## 2023-01-17 MED ORDER — OXYCODONE HCL 5 MG PO TABS
5.0000 mg | ORAL_TABLET | Freq: Four times a day (QID) | ORAL | 0 refills | Status: DC | PRN
Start: 2023-01-17 — End: 2023-05-14
  Filled 2023-01-17: qty 6, 2d supply, fill #0

## 2023-01-17 MED ORDER — SUCRALFATE 1 G PO TABS
1.0000 g | ORAL_TABLET | Freq: Three times a day (TID) | ORAL | 1 refills | Status: DC
  Filled 2023-01-17: qty 60, 15d supply, fill #0

## 2023-01-17 MED ORDER — HYDROMORPHONE HCL 1 MG/ML IJ SOLN
0.5000 mg | Freq: Once | INTRAMUSCULAR | Status: AC
  Administered 2023-01-17: 0.5 mg via INTRAVENOUS
  Filled 2023-01-17: qty 1

## 2023-01-17 MED ORDER — PANTOPRAZOLE SODIUM 40 MG IV SOLR
40.0000 mg | Freq: Once | INTRAVENOUS | Status: AC
  Administered 2023-01-17: 40 mg via INTRAVENOUS
  Filled 2023-01-17: qty 10

## 2023-01-17 MED ORDER — ONDANSETRON HCL 4 MG/2ML IJ SOLN
4.0000 mg | Freq: Once | INTRAMUSCULAR | Status: AC
  Administered 2023-01-17: 4 mg via INTRAVENOUS
  Filled 2023-01-17: qty 2

## 2023-01-17 MED ORDER — PANTOPRAZOLE SODIUM 40 MG PO TBEC
40.0000 mg | DELAYED_RELEASE_TABLET | Freq: Every day | ORAL | 0 refills | Status: DC
  Filled 2023-01-17 (×2): qty 30, 30d supply, fill #0

## 2023-01-17 MED ORDER — IOHEXOL 350 MG/ML SOLN
60.0000 mL | Freq: Once | INTRAVENOUS | Status: AC | PRN
  Administered 2023-01-17: 60 mL via INTRAVENOUS

## 2023-01-17 MED ORDER — ALBUTEROL SULFATE HFA 108 (90 BASE) MCG/ACT IN AERS
2.0000 | INHALATION_SPRAY | RESPIRATORY_TRACT | Status: DC | PRN
  Administered 2023-01-17: 2 via RESPIRATORY_TRACT
  Filled 2023-01-17: qty 6.7

## 2023-01-17 MED ORDER — OXYCODONE-ACETAMINOPHEN 5-325 MG PO TABS
1.0000 | ORAL_TABLET | Freq: Once | ORAL | Status: AC
  Administered 2023-01-17: 1 via ORAL
  Filled 2023-01-17: qty 1

## 2023-01-17 NOTE — ED Notes (Signed)
Patient given PO challenge of sandwich and ginger ale.

## 2023-01-17 NOTE — Discharge Instructions (Signed)
Please read and follow all provided instructions.  Your diagnoses today include:  1. Epigastric abdominal pain   2. Gastritis without bleeding, unspecified chronicity, unspecified gastritis type     Tests performed today include: Blood cell counts and platelets Kidney and liver function tests Pancreas function test (called lipase) Urine test to look for infection CT scan of your abdomen and pelvis shows signs of gastritis Vital signs. See below for your results today.   Medications prescribed:  Pantoprazole - stomach acid reducer  Pepcid (famotidine) - antihistamine  You can find this medication over-the-counter.   DO NOT exceed:  20mg  Pepcid every 12 hours  Carafate - for stomach upset and to protect your stomach  Oxycodone - narcotic pain medication  DO NOT drive or perform any activities that require you to be awake and alert because this medicine can make you drowsy.   Take any prescribed medications only as directed.  Home care instructions:  Follow any educational materials contained in this packet. Please stick to a clear liquid diet over the next 24 hours and then slowly advance to a bland diet.  Follow-up instructions: Please follow-up with your primary care provider in the next 3 days for further evaluation of your symptoms.    Return instructions:  SEEK IMMEDIATE MEDICAL ATTENTION IF: The pain does not go away or becomes severe  A temperature above 101F develops  Repeated vomiting occurs (multiple episodes)  The pain becomes localized to portions of the abdomen. The right side could possibly be appendicitis. In an adult, the left lower portion of the abdomen could be colitis or diverticulitis.  Blood is being passed in stools or vomit (bright red or black tarry stools)  You develop chest pain, difficulty breathing, dizziness or fainting, or become confused, poorly responsive, or inconsolable (young children) If you have any other emergent concerns regarding  your health  Additional Information: Abdominal (belly) pain can be caused by many things. Your caregiver performed an examination and possibly ordered blood/urine tests and imaging (CT scan, x-rays, ultrasound). Many cases can be observed and treated at home after initial evaluation in the emergency department. Even though you are being discharged home, abdominal pain can be unpredictable. Therefore, you need a repeated exam if your pain does not resolve, returns, or worsens. Most patients with abdominal pain don't have to be admitted to the hospital or have surgery, but serious problems like appendicitis and gallbladder attacks can start out as nonspecific pain. Many abdominal conditions cannot be diagnosed in one visit, so follow-up evaluations are very important.  Your vital signs today were: BP 107/83   Pulse (!) 49   Temp 97.6 F (36.4 C)   Resp 12   Ht 5\' 7"  (1.702 m)   Wt 57.6 kg   SpO2 94%   BMI 19.89 kg/m  If your blood pressure (bp) was elevated above 135/85 this visit, please have this repeated by your doctor within one month. --------------

## 2023-01-17 NOTE — ED Provider Notes (Signed)
[place] EMERGENCY DEPARTMENT AT [facility] Provider Note   CSN: [id number] Arrival date & time: 01/17/23  0845     History  Chief Complaint  Patient presents with   Abdominal Pain    Jason Wong is a 67 y.o. male.  Patient with history of COPD, alcohol use, tobacco use, hypertension --presents the emergency department today for evaluation of abdominal pain.  He states that his symptoms started several days ago but may have been more mild last week.  He states that he is lost his appetite.  Pain is in the epigastric area as well as the periumbilical area.  It does not radiate.  He has not had any vomiting or diarrhea with his symptoms.  No dysuria, increased frequency or urgency or hematuria.  He states that he has never had pain like this before.  No treatments at home.  He is uncertain if it is worse with eating or drinking.  Denies heavy NSAID use but has tried some Tylenol and ibuprofen for his current pain.       Home Medications Prior to Admission medications   Medication Sig Start Date End Date Taking? Authorizing Provider  acetaminophen (TYLENOL) 500 MG tablet Take 500 mg by mouth every 6 (six) hours as needed for moderate pain.    [provider]  albuterol (PROAIR HFA) 108 (90 Base) MCG/ACT inhaler Inhale 2 puffs into the lungs once every 4 (four) hours as needed for wheezing or shortness of breath. 12/29/22   Karie Mainland, Amjad, PA-C  amLODipine (NORVASC) 5 MG tablet Take 1 tablet (5 mg total) by mouth daily. 06/27/22   Claiborne Rigg, NP  amLODipine (NORVASC) 5 MG tablet Take 1 tablet (5 mg total) by mouth daily. 11/17/22   Roemhildt, Lorin T, PA-C  amoxicillin-clavulanate (AUGMENTIN) 875-125 MG tablet Take 1 tablet by mouth every 12 (twelve) hours. 12/29/22   Karie Mainland, Amjad, PA-C  atorvastatin (LIPITOR) 10 MG tablet Take 1 tablet (10 mg total) by mouth daily. 06/27/22   Claiborne Rigg, NP  Fluticasone-Umeclidin-Vilant (TRELEGY ELLIPTA) 200-62.5-25 MCG/ACT AEPB  Inhale 1 puff into the lungs daily. 11/12/22   Rexford Maus, DO  Fluticasone-Umeclidin-Vilant (TRELEGY ELLIPTA) 200-62.5-25 MCG/ACT AEPB Inhale 1 puff into the lungs daily. 11/17/22   Roemhildt, Lorin T, PA-C  ipratropium-albuterol (DUONEB) 0.5-2.5 (3) MG/3ML SOLN Take 3 mLs by nebulization every 6 (six) hours as needed. 11/17/22   Roemhildt, Lorin T, PA-C  mirtazapine (REMERON) 15 MG tablet Take 0.5 tablets (7.5 mg total) by mouth at bedtime. 06/27/22   Claiborne Rigg, NP  thiamine (VITAMIN B-1) 100 MG tablet Take 1 tablet (100 mg total) by mouth daily. 06/24/22   Joseph Art, DO      Allergies    Tobacco    Review of Systems   Review of Systems  Physical Exam Updated Vital Signs BP (!) 126/93 (BP Location: Right Arm)   Pulse 85   Temp 97.6 F (36.4 C)   Resp 18   Ht 5\' 7"  (1.702 m)   Wt 57.6 kg   SpO2 100%   BMI 19.89 kg/m  Physical Exam Vitals and nursing note reviewed.  Constitutional:      General: He is not in acute distress.    Appearance: He is well-developed.  HENT:     Head: Normocephalic and atraumatic.  Eyes:     General:        Right eye: No discharge.        Left eye: No  discharge.     Conjunctiva/sclera: Conjunctivae normal.  Cardiovascular:     Rate and Rhythm: Normal rate and regular rhythm.     Heart sounds: Normal heart sounds.  Pulmonary:     Effort: Pulmonary effort is normal.     Breath sounds: Normal breath sounds.  Abdominal:     Palpations: Abdomen is soft.     Tenderness: There is abdominal tenderness in the epigastric area and periumbilical area. There is no guarding or rebound. Negative signs include Murphy's sign and McBurney's sign.  Musculoskeletal:     Cervical back: Normal range of motion and neck supple.  Skin:    General: Skin is warm and dry.  Neurological:     Mental Status: He is alert.     ED Results / Procedures / Treatments   Labs (all labs ordered are listed, but only abnormal results are displayed) Labs  Reviewed  COMPREHENSIVE METABOLIC PANEL - Abnormal; Notable for the following components:      Result Value   CO2 21 (*)    Glucose, Bld 104 (*)    All other components within normal limits  LIPASE, BLOOD  CBC  URINALYSIS, ROUTINE W REFLEX MICROSCOPIC  TROPONIN I (HIGH SENSITIVITY)    EKG None  Radiology CT ABDOMEN PELVIS W CONTRAST  Result Date: 01/17/2023 CLINICAL DATA:  Abdominal pain, acute, nonlocalized epigastric/periumbilical pain. EXAM: CT ABDOMEN AND PELVIS WITH CONTRAST TECHNIQUE: Multidetector CT imaging of the abdomen and pelvis was performed using the standard protocol following bolus administration of intravenous contrast. RADIATION DOSE REDUCTION: This exam was performed according to the departmental dose-optimization program which includes automated exposure control, adjustment of the mA and/or kV according to patient size and/or use of iterative reconstruction technique. CONTRAST:  60mL OMNIPAQUE IOHEXOL 350 MG/ML SOLN COMPARISON:  None Available. FINDINGS: Lower chest: There are subpleural atelectatic changes in the visualized lung bases. No overt consolidation. No pleural effusion. The heart is normal in size. No pericardial effusion. Hepatobiliary: The liver is normal in size. Non-cirrhotic configuration. No suspicious mass. There is a subcentimeter hypoattenuating focus in the left hepatic lobe, segment 2, favored to represent a cyst. No intrahepatic or extrahepatic bile duct dilation. No calcified gallstones. Normal gallbladder wall thickness. No pericholecystic inflammatory changes. Pancreas: Unremarkable. No pancreatic ductal dilatation or surrounding inflammatory changes. Spleen: Within normal limits. No focal lesion. Adrenals/Urinary Tract: Adrenal glands are unremarkable. No suspicious renal mass. There are several subcentimeter hypoattenuating foci in the left kidney, too small to adequately characterize. No hydronephrosis. No renal or ureteric calculi. Unremarkable  urinary bladder. Stomach/Bowel: There is asymmetric, circumferential thickening of the pylorus of the stomach, which may be accentuated by underdistention. No surrounding fat stranding. There is a single approximately 1 cm sized submucosal lipoma in the pylorus. No disproportionate dilation of the small or large bowel loops. No evidence of abnormal bowel wall thickening or inflammatory changes. The appendix is unremarkable. Vascular/Lymphatic: No ascites or pneumoperitoneum. No abdominal or pelvic lymphadenopathy, by size criteria. No aneurysmal dilation of the major abdominal arteries. There are moderate peripheral atherosclerotic vascular calcifications of the aorta and its major branches. Reproductive: There is a top normal-sized prostate gland with median lobe projecting into the bladder base. Symmetric bilateral seminal vesicles. Other: There is a tiny fat containing periumbilical hernia. The soft tissues and abdominal wall are otherwise unremarkable. Musculoskeletal: No suspicious osseous lesions. Note is made of fatty replacement of the sacrum. IMPRESSION: *No acute inflammatory process identified within the abdomen or pelvis. *Asymmetric circumferential thickening of the  pylorus of the stomach, which may be accentuated by underdistention. Findings are nonspecific but can be seen with gastritis. Correlate clinically to determine the need for further evaluation with nonemergent endoscopy. *Multiple other nonacute observations, as described above. Electronically Signed   By: Jules Schick M.D.   On: 01/17/2023 14:07    Procedures Procedures    Medications Ordered in ED Medications  HYDROmorphone (DILAUDID) injection 0.5 mg (0.5 mg Intravenous Given 01/17/23 1034)  ondansetron (ZOFRAN) injection 4 mg (4 mg Intravenous Given 01/17/23 1034)  iohexol (OMNIPAQUE) 350 MG/ML injection 60 mL (60 mLs Intravenous Contrast Given 01/17/23 1237)  pantoprazole (PROTONIX) injection 40 mg (40 mg Intravenous Given  01/17/23 1420)  oxyCODONE-acetaminophen (PERCOCET/ROXICET) 5-325 MG per tablet 1 tablet (1 tablet Oral Given 01/17/23 1440)    ED Course/ Medical Decision Making/ A&P    Patient seen and examined. History obtained directly from patient.   Labs/EKG: Ordered CBC, CMP, lipase, UA, troponin.  Imaging: Ordered CT abdomen pelvis with contrast.  Medications/Fluids: Ordered: Hydromorphone 0.5 mg IV, Zofran 4 mg IV.   Most recent vital signs reviewed and are as follows: BP (!) 126/93 (BP Location: Right Arm)   Pulse 85   Temp 97.6 F (36.4 C)   Resp 18   Ht 5\' 7"  (1.702 m)   Wt 57.6 kg   SpO2 100%   BMI 19.89 kg/m   Initial impression: Abdominal pain  3:12 PM Reassessment performed. Patient appears stable.  Requesting pain medicine.  Labs personally reviewed and interpreted including: CBC unremarkable; CMP glucose 104, bicarb 21; lipase normal; troponin normal at 4.  Imaging personally visualized and interpreted including: CT abdomen pelvis, agree no acute findings other than signs of gastritis.  Reviewed pertinent lab work and imaging with patient at bedside. Questions answered.   Most current vital signs reviewed and are as follows: BP 107/83   Pulse (!) 49   Temp 97.6 F (36.4 C)   Resp 12   Ht 5\' 7"  (1.702 m)   Wt 57.6 kg   SpO2 94%   BMI 19.89 kg/m   Plan: Will give dose of oral pain medicine, dose of IV Protonix.  Plan for discharge to home.  3:24 PM   Prescriptions written for: # 6 tablets oxycodone 5 mg to use for severe pain, p.o. Protonix, p.o. Pepcid, p.o. Carafate  Other home care instructions discussed: Avoidance of alcohol, avoidance of NSAIDs as these can make gastritis worse  ED return instructions discussed: For this patient's complaint of abdominal pain, the following conditions were considered on the differential diagnosis: gastritis/PUD, enteritis/duodenitis, appendicitis, cholelithiasis/cholecystitis, cholangitis, pancreatitis, ruptured viscus,  colitis, diverticulitis, small/large bowel obstruction, proctitis, cystitis, pyelonephritis, ureteral colic, aortic dissection, aortic aneurysm. In women, ectopic pregnancy, pelvic inflammatory disease, ovarian cysts, and tubo-ovarian abscess were also considered. Atypical chest etiologies were also considered including ACS, PE, and pneumonia.  Follow-up instructions discussed: Patient encouraged to follow-up with their PCP in 3 days.                                  Medical Decision Making Amount and/or Complexity of Data Reviewed Labs: ordered. Radiology: ordered.  Risk Prescription drug management.   For this patient's complaint of abdominal pain, the following conditions were considered on the differential diagnosis: gastritis/PUD, enteritis/duodenitis, appendicitis, cholelithiasis/cholecystitis, cholangitis, pancreatitis, ruptured viscus, colitis, diverticulitis, small/large bowel obstruction, proctitis, cystitis, pyelonephritis, ureteral colic, aortic dissection, aortic aneurysm. Atypical chest etiologies were also considered including  ACS, PE, and pneumonia.  Lab work overall is very reassuring.  Normal liver function testing.  Normal hemoglobin, no signs of occult GI bleeding.  CT with signs of gastritis which is consistent with the patient's symptoms.  He will be treated symptomatically.  Small amount of pain medicine given for more severe pain.  Symptoms controlled in ED.        Final Clinical Impression(s) / ED Diagnoses Final diagnoses:  Epigastric abdominal pain  Gastritis without bleeding, unspecified chronicity, unspecified gastritis type    Rx / DC Orders ED Discharge Orders          Ordered    oxyCODONE (OXY IR/ROXICODONE) 5 MG immediate release tablet  Every 6 hours PRN        01/17/23 1451    pantoprazole (PROTONIX) 40 MG tablet  Daily        01/17/23 1451    famotidine (PEPCID) 20 MG tablet  2 times daily        01/17/23 1451    sucralfate (CARAFATE) 1 g  tablet  3 times daily with meals & bedtime        01/17/23 1451              Renne Crigler, PA-C 01/17/23 1526    Gerhard Munch, MD 01/18/23 1510

## 2023-01-17 NOTE — ED Triage Notes (Signed)
Pt c/o abdominal pain x 4 days. Pt denies nausea, vomiting, diarrhea, and constipation. Last BM this am.

## 2023-01-23 ENCOUNTER — Other Ambulatory Visit: Payer: Self-pay

## 2023-01-23 ENCOUNTER — Emergency Department (HOSPITAL_COMMUNITY): Payer: Medicare Other

## 2023-01-23 ENCOUNTER — Emergency Department (HOSPITAL_COMMUNITY)
Admission: EM | Admit: 2023-01-23 | Discharge: 2023-01-23 | Disposition: A | Discharge status: Home / Self Care | Payer: Medicare Other | Attending: Emergency Medicine | Admitting: Emergency Medicine

## 2023-01-23 DIAGNOSIS — I10 Essential (primary) hypertension: Secondary | ICD-10-CM | POA: Insufficient documentation

## 2023-01-23 DIAGNOSIS — J441 Chronic obstructive pulmonary disease with (acute) exacerbation: Secondary | ICD-10-CM | POA: Diagnosis not present

## 2023-01-23 DIAGNOSIS — Z7951 Long term (current) use of inhaled steroids: Secondary | ICD-10-CM | POA: Diagnosis not present

## 2023-01-23 DIAGNOSIS — J45909 Unspecified asthma, uncomplicated: Secondary | ICD-10-CM | POA: Diagnosis not present

## 2023-01-23 DIAGNOSIS — J432 Centrilobular emphysema: Secondary | ICD-10-CM

## 2023-01-23 DIAGNOSIS — Z79899 Other long term (current) drug therapy: Secondary | ICD-10-CM | POA: Diagnosis not present

## 2023-01-23 DIAGNOSIS — R0602 Shortness of breath: Secondary | ICD-10-CM | POA: Diagnosis present

## 2023-01-23 LAB — BASIC METABOLIC PANEL
Anion gap: 13 (ref 5–15)
BUN: 5 mg/dL — ABNORMAL LOW (ref 8–23)
CO2: 18 mmol/L — ABNORMAL LOW (ref 22–32)
Calcium: 9.4 mg/dL (ref 8.9–10.3)
Chloride: 108 mmol/L (ref 98–111)
Creatinine, Ser: 1.01 mg/dL (ref 0.61–1.24)
GFR, Estimated: >60 mL/min (ref >60)
Glucose, Bld: 110 mg/dL — ABNORMAL HIGH (ref 70–99)
Potassium: 4.1 mmol/L (ref 3.5–5.1)
Sodium: 139 mmol/L (ref 135–145)

## 2023-01-23 LAB — CBC
HCT: 37.3 % — ABNORMAL LOW (ref 39.0–52.0)
Hemoglobin: 12.5 g/dL — ABNORMAL LOW (ref 13.0–17.0)
MCH: 31.8 pg (ref 26.0–34.0)
MCHC: 33.5 g/dL (ref 30.0–36.0)
MCV: 94.9 fL (ref 80.0–100.0)
Platelets: 296 10*3/uL (ref 150–400)
RBC: 3.93 MIL/uL — ABNORMAL LOW (ref 4.22–5.81)
RDW: 13.9 % (ref 11.5–15.5)
WBC: 5.6 10*3/uL (ref 4.0–10.5)
nRBC: 0 % (ref 0.0–0.2)

## 2023-01-23 MED ORDER — ALBUTEROL SULFATE HFA 108 (90 BASE) MCG/ACT IN AERS
2.0000 | INHALATION_SPRAY | RESPIRATORY_TRACT | 0 refills | Status: DC | PRN
  Filled 2023-01-23: qty 6.7, 17d supply, fill #0

## 2023-01-23 MED ORDER — PREDNISONE 10 MG PO TABS
40.0000 mg | ORAL_TABLET | Freq: Every day | ORAL | 0 refills | Status: AC
  Filled 2023-01-23: qty 16, 4d supply, fill #0

## 2023-01-23 MED ORDER — AMOXICILLIN-POT CLAVULANATE 875-125 MG PO TABS
1.0000 | ORAL_TABLET | Freq: Two times a day (BID) | ORAL | 0 refills | Status: AC
  Filled 2023-01-23: qty 14, 7d supply, fill #0

## 2023-01-23 MED ORDER — METHYLPREDNISOLONE SODIUM SUCC 125 MG IJ SOLR
125.0000 mg | Freq: Once | INTRAMUSCULAR | Status: AC
  Administered 2023-01-23: 125 mg via INTRAVENOUS
  Filled 2023-01-23: qty 2

## 2023-01-23 MED ORDER — ALBUTEROL SULFATE HFA 108 (90 BASE) MCG/ACT IN AERS
2.0000 | INHALATION_SPRAY | Freq: Once | RESPIRATORY_TRACT | Status: AC
  Administered 2023-01-23: 2 via RESPIRATORY_TRACT
  Filled 2023-01-23: qty 6.7

## 2023-01-23 MED ORDER — IPRATROPIUM-ALBUTEROL 0.5-2.5 (3) MG/3ML IN SOLN
3.0000 mL | Freq: Once | RESPIRATORY_TRACT | Status: AC
  Administered 2023-01-23: 3 mL via RESPIRATORY_TRACT
  Filled 2023-01-23: qty 3

## 2023-01-23 NOTE — ED Triage Notes (Signed)
Patient with hx of COPD complains of DOE x 3 days. Denies chest pain or swelling in extremities.

## 2023-01-23 NOTE — Discharge Instructions (Addendum)
Antibiotic and prednisone sent into your pharmacy.  Start the prednisone tomorrow since you received IV dose.  For any concerning symptoms return to the emergency room.

## 2023-01-23 NOTE — ED Provider Notes (Signed)
[place] [facility] DE[facility]MENT AT [facility] Provider Note   CSN: [id number] Arrival date & time: 01/23/23  4098     History  Chief Complaint  Patient presents with   Shortness of Breath    Collier Bohnet is a 67 y.o. male.  67 year old male presents today for concern of worsening shortness of breath for the past 3 days.  Particularly worse with exertion.  No prior history of CHF.  Does have history of COPD.  Feels similar to his previous COPD exacerbations.  No chest pain, lightheadedness, or palpitations.  He is out of his albuterol inhaler.  Denies fever or worsening cough.  Does report chronic productive cough though.  The history is provided by the patient. No language interpreter was used.       Home Medications Prior to Admission medications   Medication Sig Start Date End Date Taking? Authorizing Provider  acetaminophen (TYLENOL) 500 MG tablet Take 500 mg by mouth every 6 (six) hours as needed for moderate pain.    [provider]  albuterol (PROAIR HFA) 108 (90 Base) MCG/ACT inhaler Inhale 2 puffs into the lungs once every 4 (four) hours as needed for wheezing or shortness of breath. 12/29/22   Karie Mainland, Elzabeth Mcquerry, PA-C  amLODipine (NORVASC) 5 MG tablet Take 1 tablet (5 mg total) by mouth daily. 06/27/22   Claiborne Rigg, NP  amLODipine (NORVASC) 5 MG tablet Take 1 tablet (5 mg total) by mouth daily. 11/17/22   Roemhildt, Lorin T, PA-C  amoxicillin-clavulanate (AUGMENTIN) 875-125 MG tablet Take 1 tablet by mouth every 12 (twelve) hours. 12/29/22   Karie Mainland, Saranne Crislip, PA-C  atorvastatin (LIPITOR) 10 MG tablet Take 1 tablet (10 mg total) by mouth daily. 06/27/22   Claiborne Rigg, NP  famotidine (PEPCID) 20 MG tablet Take 1 tablet (20 mg total) by mouth 2 (two) times daily. 01/17/23   Renne Crigler, PA-C  Fluticasone-Umeclidin-Vilant (TRELEGY ELLIPTA) 200-62.5-25 MCG/ACT AEPB Inhale 1 puff into the lungs daily. 11/12/22   Rexford Maus, DO   Fluticasone-Umeclidin-Vilant (TRELEGY ELLIPTA) 200-62.5-25 MCG/ACT AEPB Inhale 1 puff into the lungs daily. 11/17/22   Roemhildt, Lorin T, PA-C  ipratropium-albuterol (DUONEB) 0.5-2.5 (3) MG/3ML SOLN Take 3 mLs by nebulization every 6 (six) hours as needed. 11/17/22   Roemhildt, Lorin T, PA-C  mirtazapine (REMERON) 15 MG tablet Take 0.5 tablets (7.5 mg total) by mouth at bedtime. 06/27/22   Claiborne Rigg, NP  oxyCODONE (OXY IR/ROXICODONE) 5 MG immediate release tablet Take 1 tablet (5 mg total) by mouth every 6 (six) hours as needed for severe pain (pain score 7-10). 01/17/23   Renne Crigler, PA-C  pantoprazole (PROTONIX) 40 MG tablet Take 1 tablet (40 mg total) by mouth daily. 01/17/23   Renne Crigler, PA-C  sucralfate (CARAFATE) 1 g tablet Take 1 tablet (1 g total) by mouth 4 (four) times daily -  with meals and at bedtime. 01/17/23   Renne Crigler, PA-C  thiamine (VITAMIN B-1) 100 MG tablet Take 1 tablet (100 mg total) by mouth daily. 06/24/22   Joseph Art, DO      Allergies    Tobacco    Review of Systems   Review of Systems  Constitutional:  Negative for chills and fever.  Respiratory:  Positive for cough and shortness of breath.   Cardiovascular:  Negative for chest pain, palpitations and leg swelling.  Neurological:  Negative for light-headedness.  All other systems reviewed and are negative.   Physical Exam Updated Vital Signs BP 101/84  Pulse 82   Temp 98.1 F (36.7 C)   Resp 17   SpO2 94%  Physical Exam Vitals and nursing note reviewed.  Constitutional:      General: He is not in acute distress.    Appearance: Normal appearance. He is not ill-appearing.  HENT:     Head: Normocephalic and atraumatic.     Nose: Nose normal.  Eyes:     General: No scleral icterus.    Extraocular Movements: Extraocular movements intact.     Conjunctiva/sclera: Conjunctivae normal.  Cardiovascular:     Rate and Rhythm: Normal rate and regular rhythm.     Heart sounds: Normal  heart sounds.  Pulmonary:     Effort: Pulmonary effort is normal. No respiratory distress.     Breath sounds: Normal breath sounds. No wheezing or rales.  Abdominal:     General: There is no distension.     Tenderness: There is no abdominal tenderness.  Musculoskeletal:        General: No deformity. Normal range of motion.     Cervical back: Normal range of motion.     Right lower leg: No edema.     Left lower leg: No edema.  Skin:    General: Skin is warm and dry.     Findings: No rash.  Neurological:     General: No focal deficit present.     Mental Status: He is alert. Mental status is at baseline.     ED Results / Procedures / Treatments   Labs (all labs ordered are listed, but only abnormal results are displayed) Labs Reviewed  CBC - Abnormal; Notable for the following components:      Result Value   RBC 3.93 (*)    Hemoglobin 12.5 (*)    HCT 37.3 (*)    All other components within normal limits  BASIC METABOLIC PANEL - Abnormal; Notable for the following components:   CO2 18 (*)    Glucose, Bld 110 (*)    BUN 5 (*)    All other components within normal limits    EKG None  Radiology DG Chest 2 View  Result Date: 01/23/2023 CLINICAL DATA:  Shortness of breath. EXAM: CHEST - 2 VIEW COMPARISON:  12/29/2022 FINDINGS: The lungs are clear without focal pneumonia, edema, pneumothorax or pleural effusion. Nodular density/densities projecting over the lungs are compatible with pads for telemetry leads. The cardiopericardial silhouette is within normal limits for size. Old posterior left 5th rib fracture evident. IMPRESSION: No active cardiopulmonary disease. Electronically Signed   By: Kennith Center M.D.   On: 01/23/2023 10:29    Procedures Procedures    Medications Ordered in ED Medications  ipratropium-albuterol (DUONEB) 0.5-2.5 (3) MG/3ML nebulizer solution 3 mL (3 mLs Nebulization Given 01/23/23 1049)  methylPREDNISolone sodium succinate (SOLU-MEDROL) 125 mg/2 mL  injection 125 mg (125 mg Intravenous Given 01/23/23 1053)    ED Course/ Medical Decision Making/ A&P                                 Medical Decision Making Amount and/or Complexity of Data Reviewed Labs: ordered. Radiology: ordered.  Risk Prescription drug management.   Medical Decision Making / ED Course   This patient presents to the ED for concern of shortness of breath, this involves an extensive number of treatment options, and is a complaint that carries with it a high risk of complications and morbidity.  The differential  diagnosis includes COPD exacerbation, CHF exacerbation, ACS, PE, pneumonia  MDM: 67 year old male presents today for concern of worsening shortness of breath for the past 3 days.  Has history of COPD.  States this feels similar.  I ambulated the patient within the room.  No hypoxia.  Hemodynamically stable.  Consistent with his COPD exacerbation.  Improved after dose of steroids and DuoNeb in the emergency department.  Albuterol inhaler provided.  Course of Augmentin and prednisone given.  He is stable for discharge.  Discharged in stable condition.  Return precaution discussed.  Patient voices understanding and is in agreement with plan.  No evidence of ACS given no anginal symptoms particularly no chest pain.  No prior history of CHF making CHF exacerbation unlikely.  No prior history of DVT or PE.  No evidence of pneumonia on chest x-ray.  Lab Tests: -I ordered, reviewed, and interpreted labs.   The pertinent results include:   Labs Reviewed  CBC - Abnormal; Notable for the following components:      Result Value   RBC 3.93 (*)    Hemoglobin 12.5 (*)    HCT 37.3 (*)    All other components within normal limits  BASIC METABOLIC PANEL - Abnormal; Notable for the following components:   CO2 18 (*)    Glucose, Bld 110 (*)    BUN 5 (*)    All other components within normal limits      EKG  EKG Interpretation Date/Time:    Ventricular Rate:    PR  Interval:    QRS Duration:    QT Interval:    QTC Calculation:   R Axis:      Text Interpretation:           Imaging Studies ordered: I ordered imaging studies including chest x-ray I independently visualized and interpreted imaging. I agree with the radiologist interpretation   Medicines ordered and prescription drug management: Meds ordered this encounter  Medications   ipratropium-albuterol (DUONEB) 0.5-2.5 (3) MG/3ML nebulizer solution 3 mL   methylPREDNISolone sodium succinate (SOLU-MEDROL) 125 mg/2 mL injection 125 mg    IV methylprednisolone will be converted to either a q12h or q24h frequency with the same total daily dose (TDD).  Ordered Dose: 1 to 125 mg TDD; convert to: TDD q24h.  Ordered Dose: 126 to 250 mg TDD; convert to: TDD div q12h.  Ordered Dose: >250 mg TDD; DAW.    -I have reviewed the patients home medicines and have made adjustments as needed  Cardiac Monitoring: The patient was maintained on a cardiac monitor.  I personally viewed and interpreted the cardiac monitored which showed an underlying rhythm of: Normal sinus rhythm  Reevaluation: After the interventions noted above, I reevaluated the patient and found that they have :improved  Co morbidities that complicate the patient evaluation  Past Medical History:  Diagnosis Date   Asthma    COPD (chronic obstructive pulmonary disease) (HCC)    Hepatitis C antibody positive in blood    HLD (hyperlipidemia)    HTN (hypertension)    On home oxygen therapy    per pt 08-31-2021 uses 02 about every other day      Dispostion: Patient discharged in stable condition.  No indication for admission or additional workup at this time.  Strict return precautions given.   Final Clinical Impression(s) / ED Diagnoses Final diagnoses:  COPD exacerbation (HCC)    Rx / DC Orders ED Discharge Orders  Ordered    predniSONE (DELTASONE) 10 MG tablet  Daily with breakfast        01/23/23 1423     albuterol (PROAIR HFA) 108 (90 Base) MCG/ACT inhaler  Every 4 hours PRN        01/23/23 1423    amoxicillin-clavulanate (AUGMENTIN) 875-125 MG tablet  Every 12 hours        01/23/23 1423              Marita Kansas, PA-C 01/23/23 1424    Ernie Avena, MD 01/26/23 1146

## 2023-01-30 ENCOUNTER — Other Ambulatory Visit: Payer: Self-pay

## 2023-01-30 ENCOUNTER — Emergency Department (HOSPITAL_COMMUNITY): Payer: Medicare Other

## 2023-01-30 ENCOUNTER — Encounter (HOSPITAL_COMMUNITY): Payer: Self-pay | Admitting: Internal Medicine

## 2023-01-30 ENCOUNTER — Observation Stay (HOSPITAL_COMMUNITY)
Admission: EM | Admit: 2023-01-30 | Discharge: 2023-01-31 | Disposition: A | Discharge status: Home / Self Care | Payer: Medicare Other | Attending: Emergency Medicine | Admitting: Emergency Medicine

## 2023-01-30 DIAGNOSIS — R6889 Other general symptoms and signs: Secondary | ICD-10-CM | POA: Diagnosis not present

## 2023-01-30 DIAGNOSIS — J188 Other pneumonia, unspecified organism: Secondary | ICD-10-CM | POA: Diagnosis not present

## 2023-01-30 DIAGNOSIS — Z23 Encounter for immunization: Secondary | ICD-10-CM | POA: Diagnosis not present

## 2023-01-30 DIAGNOSIS — J441 Chronic obstructive pulmonary disease with (acute) exacerbation: Secondary | ICD-10-CM | POA: Diagnosis not present

## 2023-01-30 DIAGNOSIS — I499 Cardiac arrhythmia, unspecified: Secondary | ICD-10-CM | POA: Diagnosis not present

## 2023-01-30 DIAGNOSIS — J439 Emphysema, unspecified: Secondary | ICD-10-CM | POA: Diagnosis not present

## 2023-01-30 DIAGNOSIS — J9601 Acute respiratory failure with hypoxia: Secondary | ICD-10-CM | POA: Diagnosis not present

## 2023-01-30 DIAGNOSIS — R0603 Acute respiratory distress: Secondary | ICD-10-CM | POA: Diagnosis not present

## 2023-01-30 DIAGNOSIS — Z1152 Encounter for screening for COVID-19: Secondary | ICD-10-CM | POA: Insufficient documentation

## 2023-01-30 DIAGNOSIS — Z79899 Other long term (current) drug therapy: Secondary | ICD-10-CM | POA: Diagnosis not present

## 2023-01-30 DIAGNOSIS — R0689 Other abnormalities of breathing: Secondary | ICD-10-CM | POA: Diagnosis not present

## 2023-01-30 DIAGNOSIS — Z87891 Personal history of nicotine dependence: Secondary | ICD-10-CM | POA: Insufficient documentation

## 2023-01-30 DIAGNOSIS — Z743 Need for continuous supervision: Secondary | ICD-10-CM | POA: Diagnosis not present

## 2023-01-30 DIAGNOSIS — R0602 Shortness of breath: Secondary | ICD-10-CM | POA: Diagnosis present

## 2023-01-30 DIAGNOSIS — R062 Wheezing: Secondary | ICD-10-CM | POA: Diagnosis not present

## 2023-01-30 DIAGNOSIS — I10 Essential (primary) hypertension: Secondary | ICD-10-CM | POA: Insufficient documentation

## 2023-01-30 DIAGNOSIS — J189 Pneumonia, unspecified organism: Principal | ICD-10-CM | POA: Diagnosis present

## 2023-01-30 DIAGNOSIS — E785 Hyperlipidemia, unspecified: Secondary | ICD-10-CM | POA: Insufficient documentation

## 2023-01-30 LAB — CBC WITH DIFFERENTIAL/PLATELET
Abs Immature Granulocytes: 0.01 10*3/uL (ref 0.00–0.07)
Basophils Absolute: 0.1 10*3/uL (ref 0.0–0.1)
Basophils Relative: 1 %
Eosinophils Absolute: 0.9 10*3/uL — ABNORMAL HIGH (ref 0.0–0.5)
Eosinophils Relative: 11 %
HCT: 42 % (ref 39.0–52.0)
Hemoglobin: 13.8 g/dL (ref 13.0–17.0)
Immature Granulocytes: 0 %
Lymphocytes Relative: 51 %
Lymphs Abs: 4.4 10*3/uL — ABNORMAL HIGH (ref 0.7–4.0)
MCH: 32.5 pg (ref 26.0–34.0)
MCHC: 32.9 g/dL (ref 30.0–36.0)
MCV: 98.8 fL (ref 80.0–100.0)
Monocytes Absolute: 0.8 10*3/uL (ref 0.1–1.0)
Monocytes Relative: 9 %
Neutro Abs: 2.4 10*3/uL (ref 1.7–7.7)
Neutrophils Relative %: 28 %
Platelets: 350 10*3/uL (ref 150–400)
RBC: 4.25 MIL/uL (ref 4.22–5.81)
RDW: 14.7 % (ref 11.5–15.5)
WBC: 8.5 10*3/uL (ref 4.0–10.5)
nRBC: 0 % (ref 0.0–0.2)

## 2023-01-30 LAB — LACTIC ACID, PLASMA
Lactic Acid, Venous: 3.4 mmol/L (ref 0.5–1.9)
Lactic Acid, Venous: 3.4 mmol/L (ref 0.5–1.9)
Lactic Acid, Venous: 3 mmol/L (ref 0.5–1.9)

## 2023-01-30 LAB — COMPREHENSIVE METABOLIC PANEL
ALT: 19 U/L (ref 0–44)
AST: 25 U/L (ref 15–41)
Albumin: 4.3 g/dL (ref 3.5–5.0)
Alkaline Phosphatase: 70 U/L (ref 38–126)
Anion gap: 9 (ref 5–15)
BUN: 8 mg/dL (ref 8–23)
CO2: 27 mmol/L (ref 22–32)
Calcium: 9.9 mg/dL (ref 8.9–10.3)
Chloride: 106 mmol/L (ref 98–111)
Creatinine, Ser: 0.99 mg/dL (ref 0.61–1.24)
GFR, Estimated: >60 mL/min (ref >60)
Glucose, Bld: 137 mg/dL — ABNORMAL HIGH (ref 70–99)
Potassium: 3.7 mmol/L (ref 3.5–5.1)
Sodium: 142 mmol/L (ref 135–145)
Total Bilirubin: 0.9 mg/dL (ref <1.2)
Total Protein: 7.5 g/dL (ref 6.5–8.1)

## 2023-01-30 LAB — RESP PANEL BY RT-PCR (RSV, FLU A&B, COVID)  RVPGX2
Influenza A by PCR: NEGATIVE
Influenza B by PCR: NEGATIVE
Resp Syncytial Virus by PCR: NEGATIVE
SARS Coronavirus 2 by RT PCR: NEGATIVE

## 2023-01-30 LAB — I-STAT CG4 LACTIC ACID, ED: Lactic Acid, Venous: 2.6 mmol/L (ref 0.5–1.9)

## 2023-01-30 LAB — BLOOD GAS, VENOUS
Acid-base deficit: 1.4 mmol/L (ref 0.0–2.0)
Bicarbonate: 22.8 mmol/L (ref 20.0–28.0)
Drawn by: 8222
O2 Saturation: 82.6 %
Patient temperature: 37
pCO2, Ven: 36 mm[Hg] — ABNORMAL LOW (ref 44–60)
pH, Ven: 7.41 (ref 7.25–7.43)
pO2, Ven: 49 mm[Hg] — ABNORMAL HIGH (ref 32–45)

## 2023-01-30 LAB — HIV ANTIBODY (ROUTINE TESTING W REFLEX): HIV Screen 4th Generation wRfx: NONREACTIVE

## 2023-01-30 LAB — GLUCOSE, CAPILLARY: Glucose-Capillary: 157 mg/dL — ABNORMAL HIGH (ref 70–99)

## 2023-01-30 MED ORDER — IPRATROPIUM BROMIDE 0.02 % IN SOLN
0.5000 mg | Freq: Four times a day (QID) | RESPIRATORY_TRACT | Status: DC
  Administered 2023-01-30: 0.5 mg via RESPIRATORY_TRACT
  Filled 2023-01-30: qty 2.5

## 2023-01-30 MED ORDER — DEXTROSE 5 % IV SOLN: 250.0000 mg | INTRAVENOUS | Status: DC

## 2023-01-30 MED ORDER — ALBUTEROL SULFATE (2.5 MG/3ML) 0.083% IN NEBU: 2.5000 mg | INHALATION_SOLUTION | RESPIRATORY_TRACT | Status: DC | PRN

## 2023-01-30 MED ORDER — IPRATROPIUM-ALBUTEROL 0.5-2.5 (3) MG/3ML IN SOLN
3.0000 mL | RESPIRATORY_TRACT | Status: AC
  Administered 2023-01-30 (×3): 3 mL via RESPIRATORY_TRACT
  Filled 2023-01-30: qty 9

## 2023-01-30 MED ORDER — RIVAROXABAN 10 MG PO TABS
10.0000 mg | ORAL_TABLET | Freq: Every day | ORAL | Status: DC
  Administered 2023-01-30 – 2023-01-31 (×2): 10 mg via ORAL
  Filled 2023-01-30 (×2): qty 1

## 2023-01-30 MED ORDER — MIRTAZAPINE 15 MG PO TABS
15.0000 mg | ORAL_TABLET | Freq: Every day | ORAL | Status: DC
  Administered 2023-01-30: 15 mg via ORAL
  Filled 2023-01-30: qty 1

## 2023-01-30 MED ORDER — INFLUENZA VAC A&B SURF ANT ADJ 0.5 ML IM SUSY
0.5000 mL | PREFILLED_SYRINGE | INTRAMUSCULAR | Status: AC
  Administered 2023-01-31: 0.5 mL via INTRAMUSCULAR
  Filled 2023-01-30: qty 0.5

## 2023-01-30 MED ORDER — DEXTROSE 5 % IV SOLN
500.0000 mg | Freq: Once | INTRAVENOUS | Status: AC
  Administered 2023-01-30: 500 mg via INTRAVENOUS
  Filled 2023-01-30: qty 5

## 2023-01-30 MED ORDER — ACETAMINOPHEN 325 MG PO TABS
650.0000 mg | ORAL_TABLET | Freq: Once | ORAL | Status: AC
  Administered 2023-01-30: 650 mg via ORAL
  Filled 2023-01-30: qty 2

## 2023-01-30 MED ORDER — PREDNISONE 20 MG PO TABS
40.0000 mg | ORAL_TABLET | Freq: Every day | ORAL | Status: DC
  Administered 2023-01-30 – 2023-01-31 (×2): 40 mg via ORAL
  Filled 2023-01-30 (×2): qty 2

## 2023-01-30 MED ORDER — SODIUM CHLORIDE 0.9 % IV SOLN
2.0000 g | INTRAVENOUS | Status: DC
  Administered 2023-01-31: 2 g via INTRAVENOUS
  Filled 2023-01-30: qty 20

## 2023-01-30 MED ORDER — ALBUTEROL SULFATE (2.5 MG/3ML) 0.083% IN NEBU
2.5000 mg | INHALATION_SOLUTION | Freq: Four times a day (QID) | RESPIRATORY_TRACT | Status: DC
  Administered 2023-01-30: 2.5 mg via RESPIRATORY_TRACT
  Filled 2023-01-30: qty 3

## 2023-01-30 MED ORDER — SENNOSIDES-DOCUSATE SODIUM 8.6-50 MG PO TABS: 1.0000 | ORAL_TABLET | Freq: Every evening | ORAL | Status: DC | PRN

## 2023-01-30 MED ORDER — LACTATED RINGERS IV BOLUS
1000.0000 mL | Freq: Once | INTRAVENOUS | Status: AC
  Administered 2023-01-30: 1000 mL via INTRAVENOUS

## 2023-01-30 MED ORDER — BUDESONIDE 0.25 MG/2ML IN SUSP
0.2500 mg | Freq: Two times a day (BID) | RESPIRATORY_TRACT | Status: DC
  Administered 2023-01-30 – 2023-01-31 (×3): 0.25 mg via RESPIRATORY_TRACT
  Filled 2023-01-30 (×3): qty 2

## 2023-01-30 MED ORDER — IPRATROPIUM-ALBUTEROL 0.5-2.5 (3) MG/3ML IN SOLN
3.0000 mL | Freq: Four times a day (QID) | RESPIRATORY_TRACT | Status: DC
  Administered 2023-01-30 – 2023-01-31 (×4): 3 mL via RESPIRATORY_TRACT
  Filled 2023-01-30 (×4): qty 3

## 2023-01-30 MED ORDER — DEXTROSE 5 % IV SOLN
250.0000 mg | INTRAVENOUS | Status: DC
  Administered 2023-01-31: 250 mg via INTRAVENOUS
  Filled 2023-01-30 (×2): qty 2.5

## 2023-01-30 MED ORDER — ACETAMINOPHEN 325 MG PO TABS
650.0000 mg | ORAL_TABLET | Freq: Four times a day (QID) | ORAL | Status: DC | PRN
  Administered 2023-01-31: 650 mg via ORAL
  Filled 2023-01-30: qty 2

## 2023-01-30 MED ORDER — SODIUM CHLORIDE 0.9 % IV SOLN
2.0000 g | Freq: Once | INTRAVENOUS | Status: AC
  Administered 2023-01-30: 2 g via INTRAVENOUS
  Filled 2023-01-30: qty 20

## 2023-01-30 MED ORDER — ATORVASTATIN CALCIUM 10 MG PO TABS
10.0000 mg | ORAL_TABLET | Freq: Every day | ORAL | Status: DC
  Administered 2023-01-30 – 2023-01-31 (×2): 10 mg via ORAL
  Filled 2023-01-30 (×2): qty 1

## 2023-01-30 MED ORDER — SODIUM CHLORIDE 0.9 % IV SOLN: 2.0000 g | INTRAVENOUS | Status: DC

## 2023-01-30 MED ORDER — ACETAMINOPHEN 650 MG RE SUPP: 650.0000 mg | Freq: Four times a day (QID) | RECTAL | Status: DC | PRN

## 2023-01-30 NOTE — ED Notes (Signed)
ED TO INPATIENT HANDOFF REPORT  ED Nurse Name and Phone #: Theophilus Bones [phone]  S Name/Age/Gender Jason Wong 67 y.o. male Room/Bed: 047C/047C  Code Status   Code Status: Full Code  Home/SNF/Other Home Patient oriented to: self, place, time, and situation Is this baseline? Yes   Triage Complete: Triage complete  Chief Complaint Multifocal pneumonia [J18.9]  Triage Note Pt BIB [facility] from home d/t SOB progressed over the last week worsening this morning. EMS reports finding pt increased work of breathing 98% RA. Was given 2 duo neb, 125 soul medrol, and 2 g mg in route. On arrival to ED pt placed on bipap     Allergies No Known Allergies  Level of Care/Admitting Diagnosis ED Disposition     ED Disposition  Admit   Condition  --   Comment  Hospital Area: [facility] [[id number]]  Level of Care: Telemetry Medical [104]  May place patient in observation at [facility] or [facility] if equivalent level of care is available:: No  Covid Evaluation: Symptomatic Person Under Investigation (PUI) or recent exposure (last 10 days) *Testing Required*  Diagnosis: Multifocal pneumonia [4540981]  Admitting Physician: Earl Lagos [[id number]]  Attending Physician: Earl Lagos [[id number]]          B Medical/Surgery History History reviewed. No pertinent past medical history. History reviewed. No pertinent surgical history.   A IV Location/Drains/Wounds Patient Lines/Drains/Airways Status     Active Line/Drains/Airways     Name Placement date Placement time Site Days   Peripheral IV 01/30/23 20 G Left Wrist 01/30/23  0608  Wrist  less than 1   Peripheral IV 01/30/23 20 G Right Antecubital 01/30/23  0609  Antecubital  less than 1            Intake/Output Last 24 hours  Intake/Output Summary (Last 24 hours) at 01/30/2023 1210 Last data filed at 01/30/2023 0820 Gross per 24 hour  Intake 100.01 ml  Output --  Net 100.01 ml     Labs/Imaging Results for orders placed or performed during the hospital encounter of 01/30/23 (from the past 48 hour(s))  CBC with Differential     Status: Abnormal   Collection Time: 01/30/23  6:15 AM  Result Value Ref Range   WBC 8.5 4.0 - 10.5 K/uL   RBC 4.25 4.22 - 5.81 MIL/uL   Hemoglobin 13.8 13.0 - 17.0 g/dL   HCT 86.5 78.4 - 69.6 %   MCV 98.8 80.0 - 100.0 fL   MCH 32.5 26.0 - 34.0 pg   MCHC 32.9 30.0 - 36.0 g/dL   RDW 29.5 28.4 - 13.2 %   Platelets 350 150 - 400 K/uL   nRBC 0.0 0.0 - 0.2 %   Neutrophils Relative % 28 %   Neutro Abs 2.4 1.7 - 7.7 K/uL   Lymphocytes Relative 51 %   Lymphs Abs 4.4 (H) 0.7 - 4.0 K/uL   Monocytes Relative 9 %   Monocytes Absolute 0.8 0.1 - 1.0 K/uL   Eosinophils Relative 11 %   Eosinophils Absolute 0.9 (H) 0.0 - 0.5 K/uL   Basophils Relative 1 %   Basophils Absolute 0.1 0.0 - 0.1 K/uL   Immature Granulocytes 0 %   Abs Immature Granulocytes 0.01 0.00 - 0.07 K/uL    Comment: Performed at [facility], [address], [place], [place] [address]  Comprehensive metabolic panel     Status: Abnormal   Collection Time: 01/30/23  6:15 AM  Result Value Ref Range  Sodium 142 135 - 145 mmol/L   Potassium 3.7 3.5 - 5.1 mmol/L   Chloride 106 98 - 111 mmol/L   CO2 27 22 - 32 mmol/L   Glucose, Bld 137 (H) 70 - 99 mg/dL    Comment: Glucose reference range applies only to samples taken after fasting for at least 8 hours.   BUN 8 8 - 23 mg/dL   Creatinine, Ser 7.84 0.61 - 1.24 mg/dL   Calcium 9.9 8.9 - 69.6 mg/dL   Total Protein 7.5 6.5 - 8.1 g/dL   Albumin 4.3 3.5 - 5.0 g/dL   AST 25 15 - 41 U/L   ALT 19 0 - 44 U/L   Alkaline Phosphatase 70 38 - 126 U/L   Total Bilirubin 0.9 <1.2 mg/dL   GFR, Estimated >29 >52 mL/min    Comment: (NOTE) Calculated using the CKD-EPI Creatinine Equation (2021)    Anion gap 9 5 - 15    Comment: Performed at [facility], [address] [place], [place] [address]  Resp panel by RT-PCR (RSV, Flu  A&B, Covid) Anterior Nasal Swab     Status: None   Collection Time: 01/30/23  9:27 AM   Specimen: Anterior Nasal Swab  Result Value Ref Range   SARS Coronavirus 2 by RT PCR NEGATIVE NEGATIVE   Influenza A by PCR NEGATIVE NEGATIVE   Influenza B by PCR NEGATIVE NEGATIVE    Comment: (NOTE) The Xpert Xpress SARS-CoV-2/FLU/RSV plus assay is intended as an aid in the diagnosis of influenza from Nasopharyngeal swab specimens and should not be used as a sole basis for treatment. Nasal washings and aspirates are unacceptable for Xpert Xpress SARS-CoV-2/FLU/RSV testing.  Fact Sheet for Patients: BloggerCourse.com  Fact Sheet for Healthcare Providers: SeriousBroker.it  This test is not yet approved or cleared by the [place] FDA and has been authorized for detection and/or diagnosis of SARS-CoV-2 by FDA under an Emergency Use Authorization (EUA). This EUA will remain in effect (meaning this test can be used) for the duration of the COVID-19 declaration under Section 564(b)(1) of the Act, 21 U.S.C. section 360bbb-3(b)(1), unless the authorization is terminated or revoked.     Resp Syncytial Virus by PCR NEGATIVE NEGATIVE    Comment: (NOTE) Fact Sheet for Patients: BloggerCourse.com  Fact Sheet for Healthcare Providers: SeriousBroker.it  This test is not yet approved or cleared by the [place] FDA and has been authorized for detection and/or diagnosis of SARS-CoV-2 by FDA under an Emergency Use Authorization (EUA). This EUA will remain in effect (meaning this test can be used) for the duration of the COVID-19 declaration under Section 564(b)(1) of the Act, 21 U.S.C. section 360bbb-3(b)(1), unless the authorization is terminated or revoked.  Performed at [facility], [address] [place], [place] [address]   I-Stat CG4 Lactic Acid, ED     Status: Abnormal    Collection Time: 01/30/23 10:46 AM  Result Value Ref Range   Lactic Acid, Venous 2.6 (HH) 0.5 - 1.9 mmol/L   Comment NOTIFIED PHYSICIAN    DG Chest Portable 1 View  Result Date: 01/30/2023 CLINICAL DATA:  67 year old male with respiratory distress. EXAM: PORTABLE CHEST 1 VIEW COMPARISON:  Chest radiographs 01/23/2023 and earlier. FINDINGS: Portable AP upright view at 0612 hours. Chronic hyperinflation and emphysema demonstrated on CTA this year. Mediastinal contours are stable, within normal limits. Mild but increased since October bilateral perihilar and basilar predominant reticular interstitial lung markings. No superimposed pneumothorax, pulmonary edema, pleural effusion or  consolidation. Visualized tracheal air column is within normal limits. No acute osseous abnormality identified. Negative visible bowel gas. IMPRESSION: Emphysema (EPP29-J18.9) with evidence of acute bilateral interstitial infectious exacerbation. No consolidation or pleural effusion. Electronically Signed   By: Odessa Fleming M.D.   On: 01/30/2023 06:42    Pending Labs Unresulted Labs (From admission, onward)     Start     Ordered   01/31/23 0500  Basic metabolic panel  Tomorrow morning,   R        01/30/23 1031   01/31/23 0500  CBC  Tomorrow morning,   R        01/30/23 1031   01/30/23 1054  Lactic acid, plasma  (Lactic Acid)  STAT Now then every 3 hours,   R      01/30/23 1053   01/30/23 1033  Blood gas, venous  Once,   R        01/30/23 1032   01/30/23 1029  HIV Antibody (routine testing w rflx)  (HIV Antibody (Routine testing w reflex) panel)  Once,   R        01/30/23 1031            Vitals/Pain Today's Vitals   01/30/23 0645 01/30/23 0817 01/30/23 0900 01/30/23 1035  BP:   106/70   Pulse:   91   Resp:   14   Temp: 98 F (36.7 C)   98.5 F (36.9 C)  TempSrc: Axillary   Oral  SpO2:   100%   Weight:      Height:      PainSc:  2       Isolation Precautions Airborne and Contact  precautions  Medications Medications  rivaroxaban (XARELTO) tablet 10 mg (10 mg Oral Given 01/30/23 1200)  acetaminophen (TYLENOL) tablet 650 mg (has no administration in time range)    Or  acetaminophen (TYLENOL) suppository 650 mg (has no administration in time range)  senna-docusate (Senokot-S) tablet 1 tablet (has no administration in time range)  albuterol (PROVENTIL) (2.5 MG/3ML) 0.083% nebulizer solution 2.5 mg (2.5 mg Nebulization Given 01/30/23 1201)  ipratropium (ATROVENT) nebulizer solution 0.5 mg (0.5 mg Nebulization Given 01/30/23 1201)  budesonide (PULMICORT) nebulizer solution 0.25 mg (0.25 mg Nebulization Given 01/30/23 1201)  azithromycin (ZITHROMAX) 250 mg in dextrose 5 % 125 mL IVPB (has no administration in time range)  cefTRIAXone (ROCEPHIN) 2 g in sodium chloride 0.9 % 100 mL IVPB (has no administration in time range)  ipratropium-albuterol (DUONEB) 0.5-2.5 (3) MG/3ML nebulizer solution 3 mL (3 mLs Nebulization Given 01/30/23 8416)  cefTRIAXone (ROCEPHIN) 2 g in sodium chloride 0.9 % 100 mL IVPB (0 g Intravenous Stopped 01/30/23 0820)  azithromycin (ZITHROMAX) 500 mg in dextrose 5 % 250 mL IVPB (0 mg Intravenous Stopped 01/30/23 0917)  acetaminophen (TYLENOL) tablet 650 mg (650 mg Oral Given 01/30/23 0820)    Mobility walks with device     Focused Assessments Pulmonary Assessment Handoff:  Lung sounds: Bilateral Breath Sounds: Diminished O2 Device: Nasal Cannula O2 Flow Rate (L/min): 4 L/min    R Recommendations: See Admitting Provider Note  Report given to:   Additional Notes:

## 2023-01-30 NOTE — ED Provider Notes (Addendum)
Patient now off BiPAP.  He felt he did not need it anymore.  He has received his antibiotics Rocephin and Zithromax for the multifocal pneumonia.  Patient just on nasal cannula.  Oxygen sats are currently good.  No signs of sepsis at this point in time.  Were trying to still sort out who he is will try to get registration to get his records straight because this patient's been seen several times before.  But it looks like he is a brand-new patient.  Once we know this I can get him admitted.   Vanetta Mulders, MD 01/30/23 [phone]  [facility]  Patient therefore would be an unassigned medicine admission.  Also.  Patient is registered wrong.  His MRN number should be [id number]    Vanetta Mulders, MD 01/30/23 [phone]

## 2023-01-30 NOTE — ED Triage Notes (Signed)
Pt BIB [facility] from home d/t SOB progressed over the last week worsening this morning. EMS reports finding pt increased work of breathing 98% RA. Was given 2 duo neb, 125 soul medrol, and 2 g mg in route. On arrival to ED pt placed on bipap

## 2023-01-30 NOTE — Evaluation (Signed)
Physical Therapy Evaluation Patient Details Name: Jason Wong MRN: [id number] DOB: 1955-05-09 Today's Date: 01/30/2023  History of Present Illness  67 y.o. male presents to [facility] on 01/30/2023 with SOB and cough. Chest x-ray is concerning for multifocal PNA. PMH includes HTN, HLD, COPD, asthma, alcohol and tobacco use disorders.  Clinical Impression  Pt reports to physical therapy with deficits in activity tolerance and mobility. Pt tolerated ambulating household distances with the use of a cane with no exacerbations of SOB. Pt started session on 4LO2 [place], but during ambulation was able to wean to 2L02 with O2 sats stable at 97 at the end of the session. Pt was left on 2LO2 at the end of the session and the nurse was notified.The PT will continue to benefit from skilled PT while in the hospital to address remaining functional deficits. PT does not recommend follow therapy post discharge at this time.      If plan is discharge home, recommend the following: A little help with walking and/or transfers;Assistance with cooking/housework;Assist for transportation   Can travel by private vehicle        Equipment Recommendations None recommended by PT  Recommendations for Other Services       Functional Status Assessment Patient has had a recent decline in their functional status and demonstrates the ability to make significant improvements in function in a reasonable and predictable amount of time.     Precautions / Restrictions Precautions Precautions: Fall Restrictions Weight Bearing Restrictions: No      Mobility  Bed Mobility Overal bed mobility: Independent Bed Mobility: Supine to Sit, Sit to Supine     Supine to sit: Independent Sit to supine: Independent        Transfers Overall transfer level: Needs assistance Equipment used: Straight cane Transfers: Sit to/from Stand Sit to Stand: Contact guard assist                Ambulation/Gait Ambulation/Gait  assistance: Contact guard assist Gait Distance (Feet): 300 Feet Assistive device: Straight cane Gait Pattern/deviations: Step-through pattern, Decreased stride length, Narrow base of support Gait velocity: slow Gait velocity interpretation: <1.8 ft/sec, indicate of risk for recurrent falls   General Gait Details: Pt started session with 4LO2, but during ambulation was able to wean to 2L02 with O2 sats at 97.  Stairs            Wheelchair Mobility     Tilt Bed    Modified Rankin (Stroke Patients Only)       Balance Overall balance assessment: Needs assistance Sitting-balance support: No upper extremity supported, Feet supported Sitting balance-Leahy Scale: Good     Standing balance support: Single extremity supported, During functional activity Standing balance-Leahy Scale: Fair                               Pertinent Vitals/Pain Pain Assessment Pain Assessment: No/denies pain    Home Living Family/patient expects to be discharged to:: Private residence Living Arrangements: Other relatives (nephew and niece) Available Help at Discharge: Family;Available 24 hours/day Type of Home: House Home Access: Stairs to enter Entrance Stairs-Rails: Can reach both Entrance Stairs-Number of Steps: 3 Alternate Level Stairs-Number of Steps: 12 Home Layout: Two level;Able to live on main level with bedroom/bathroom (Pt primarily stay on first level.) Home Equipment: Cane - single point      Prior Function Prior Level of Function : Independent/Modified Independent  Mobility Comments: Used cane at baseline ADLs Comments: Pt does not drive at baseline     Extremity/Trunk Assessment   Upper Extremity Assessment Upper Extremity Assessment: Overall WFL for tasks assessed    Lower Extremity Assessment Lower Extremity Assessment: Overall WFL for tasks assessed    Cervical / Trunk Assessment Cervical / Trunk Assessment: Normal  Communication    Communication Communication: No apparent difficulties Cueing Techniques: Verbal cues;Tactile cues;Visual cues  Cognition Arousal: Alert Behavior During Therapy: WFL for tasks assessed/performed Overall Cognitive Status: Within Functional Limits for tasks assessed                                          General Comments General comments (skin integrity, edema, etc.): VSS on 2L [place]    Exercises     Assessment/Plan    PT Assessment Patient needs continued PT services  PT Problem List Decreased activity tolerance;Decreased balance;Decreased mobility       PT Treatment Interventions Gait training;Stair training;Functional mobility training;Therapeutic activities;Therapeutic exercise;Balance training;Neuromuscular re-education    PT Goals (Current goals can be found in the Care Plan section)  Acute Rehab PT Goals Patient Stated Goal: To return home PT Goal Formulation: With patient Time For Goal Achievement: 02/13/23 Potential to Achieve Goals: Good    Frequency Min 1X/week     Co-evaluation               AM-PAC PT "6 Clicks" Mobility  Outcome Measure Help needed turning from your back to your side while in a flat bed without using bedrails?: None Help needed moving from lying on your back to sitting on the side of a flat bed without using bedrails?: None Help needed moving to and from a bed to a chair (including a wheelchair)?: None Help needed standing up from a chair using your arms (e.g., wheelchair or bedside chair)?: None Help needed to walk in hospital room?: A Little Help needed climbing 3-5 steps with a railing? : A Little 6 Click Score: 22    End of Session Equipment Utilized During Treatment: Gait belt;Oxygen Activity Tolerance: Patient tolerated treatment well Patient left: in bed;with call bell/phone within reach;with bed alarm set Nurse Communication: Mobility status PT Visit Diagnosis: Other abnormalities of gait and mobility  (R26.89)    Time: 4098-1191 PT Time Calculation (min) (ACUTE ONLY): 24 min   Charges:   PT Evaluation $PT Eval Low Complexity: 1 Low   PT General Charges $$ ACUTE PT VISIT: 1 Visit         [name], SPT Acute Rehabilitation Office Phone [phone]   [name] 01/30/2023, 3:48 PM

## 2023-01-30 NOTE — ED Provider Notes (Signed)
[facility] AT [facility] Provider Note   CSN: [id number] Arrival date & time: 01/30/23  4098     History  Chief Complaint  Patient presents with   Respiratory Distress    Jason Wong is a 67 y.o. male.  67yo M w/ h/o COPD here with dyspnea. States he was here a week ago with the same. Was fine for a few days but the last 4 days has had progressively worsening symptoms and tonight it got too much to bear with dyspnea and non productive cough. Tried his inhaler at home this whole time. Presumably is on steroids. EMS called. 98% on RA, 100 on neb. Had mag and steroids with them. Afebrile. Slight tachy, otherwise VS reassuring. No swelling. No fevers.         Home Medications Prior to Admission medications   Not on File      Allergies    Patient has no allergy information on record.    Review of Systems   Review of Systems  Physical Exam Updated Vital Signs BP 106/86   Pulse 91   Temp 98 F (36.7 C) (Axillary)   Resp 16   Ht 5\' 7"  (1.702 m)   Wt 59 kg   SpO2 100%   BMI 20.36 kg/m  Physical Exam Vitals and nursing note reviewed.  Constitutional:      Appearance: He is well-developed.  HENT:     Head: Normocephalic and atraumatic.  Eyes:     Pupils: Pupils are equal, round, and reactive to light.  Cardiovascular:     Rate and Rhythm: Tachycardia present.  Pulmonary:     Effort: Tachypnea and respiratory distress present.     Breath sounds: Wheezing present.  Abdominal:     General: There is no distension.  Musculoskeletal:        General: Normal range of motion.     Cervical back: Normal range of motion.  Skin:    General: Skin is warm and dry.  Neurological:     General: No focal deficit present.     Mental Status: He is alert.     ED Results / Procedures / Treatments   Labs (all labs ordered are listed, but only abnormal results are displayed) Labs Reviewed  CBC WITH DIFFERENTIAL/PLATELET - Abnormal; Notable  for the following components:      Result Value   Lymphs Abs 4.4 (*)    Eosinophils Absolute 0.9 (*)    All other components within normal limits  COMPREHENSIVE METABOLIC PANEL    EKG EKG Interpretation Date/Time:  Monday January 30 2023 06:11:40 EST Ventricular Rate:  104 PR Interval:    QRS Duration:  93 QT Interval:  313 QTC Calculation: 412 R Axis:   87  Text Interpretation: sinus rhythm with significant artifact Borderline right axis deviation Borderline ST depression, diffuse leads Minimal ST elevation, lateral leads Artifact in lead(s) I II III aVR aVL aVF V1 V4 V5 V6 Reconfirmed by Marily Memos [id number]) on 01/30/2023 6:17:59 AM  Radiology DG Chest Portable 1 View  Result Date: 01/30/2023 CLINICAL DATA:  67 year old male with respiratory distress. EXAM: PORTABLE CHEST 1 VIEW COMPARISON:  Chest radiographs 01/23/2023 and earlier. FINDINGS: Portable AP upright view at 0612 hours. Chronic hyperinflation and emphysema demonstrated on CTA this year. Mediastinal contours are stable, within normal limits. Mild but increased since October bilateral perihilar and basilar predominant reticular interstitial lung markings. No superimposed pneumothorax, pulmonary edema, pleural effusion or consolidation. Visualized tracheal air column  is within normal limits. No acute osseous abnormality identified. Negative visible bowel gas. IMPRESSION: Emphysema ([id number]) with evidence of acute bilateral interstitial infectious exacerbation. No consolidation or pleural effusion. Electronically Signed   By: Odessa Fleming M.D.   On: 01/30/2023 06:42    Procedures .Critical Care  Performed by: Marily Memos, MD Authorized by: Marily Memos, MD   Critical care provider statement:    Critical care time (minutes):  30   Critical care was necessary to treat or prevent imminent or life-threatening deterioration of the following conditions:  Respiratory failure   Critical care was time spent personally by me on  the following activities:  Development of treatment plan with patient or surrogate, discussions with consultants, evaluation of patient's response to treatment, examination of patient, ordering and review of laboratory studies, ordering and review of radiographic studies, ordering and performing treatments and interventions, pulse oximetry, re-evaluation of patient's condition and review of old charts     Medications Ordered in ED Medications  ipratropium-albuterol (DUONEB) 0.5-2.5 (3) MG/3ML nebulizer solution 3 mL (3 mLs Nebulization Given 01/30/23 0347)    ED Course/ Medical Decision Making/ A&P                                 Medical Decision Making Amount and/or Complexity of Data Reviewed Labs: ordered. Radiology: ordered. ECG/medicine tests: ordered.  Risk OTC drugs. Prescription drug management. Decision regarding hospitalization.  Patient without recod to compare anything so registration will work on it.   Will start bipap. Cxr, labs, ecg. No symptoms currently to suggest pneumonia or PE. Will treat supportivewly with duonebs and bipap pending workup then attempt to wean. Already had steroids. No indication for antibiotics currently Continued bipap pending workup. Care transferred pending reevaluation. On linked chart it appears the patient often is discharged from similar conditions.    Final Clinical Impression(s) / ED Diagnoses Final diagnoses:  None    Rx / DC Orders ED Discharge Orders     None         Kyros Salzwedel, Barbara Cower, MD 02/01/23 0002

## 2023-01-30 NOTE — H&P (Cosign Needed Addendum)
Date: 01/30/2023               Patient Name:  Jason Wong MRN: [id number]  DOB: Dec 16, 1955 Age / Sex: 67 y.o., male   PCP: Pcp, No         Medical Service: Internal Medicine Teaching Service         Attending Physician: Dr. Earl Lagos, MD       *Please do not leave messages for physician in this sticky note. Please page appropriate physician as below.*   Weekday Hours (7AM-5PM):   First Contact: Kathleen Lime, MD        Pager: [place] [phone]        Second Contact: Morene Crocker, MD    Pager: [facility] [phone]    ** If no return call within 15 minutes (after trying both pagers listed above), please call after hours pagers.   After 5 pm or weekends:  1st Contact: Pager: [phone]  2nd Contact: Pager: [phone]     Chief Complaint: worsening shortness of breath  History of Present Illness:  Pt is a 67 year old male with past medical hx of HTN, HLD, COPD, Asthma, alcohol use disorder and hx of tobacco use disorder in remission. Pt presents with worsening 4 days of worsening shortness of breath and cough that is non productive. He is not reporting any other URI symptoms. He is denying any sick contacts. States other than his breathing he feels well.   On chart review, pt has a duplicate chart which is his main chart. MRN is [id number]. It appears pt was recently in the ED for similar symptoms and treated for COPD exacerbation. Pt noted some symptoms improvement with that and reports this started four days ago out of the blue. He is not able to recall any inciting events but reports over the last 4 days it has been difficult to catch his breath.   Review of Systems negative unless stated in the HPI.  In the ED, pt was given rocephin and azithromycin. Initial lab work and imaging was obtained that showed findings consistent with multifocal PNA. IMTS was called for admission.   Past Medical History: HTN HLD COPD Asthma Alcohol use disorder Tobacco use  disorder Hep C infection  Meds: Current Outpatient Medications  Medication Instructions   albuterol (VENTOLIN HFA) 108 (90 Base) MCG/ACT inhaler 2 puffs, Every 4 hours PRN   amLODipine (NORVASC) 5 mg, Oral, Daily   amoxicillin-clavulanate (AUGMENTIN) 875-125 MG tablet 1 tablet, Oral, 2 times daily   atorvastatin (LIPITOR) 10 mg, Oral, Daily   Fluticasone-Umeclidin-Vilant (TRELEGY ELLIPTA) 200-62.5-25 MCG/ACT AEPB 1 puff, Daily   ipratropium-albuterol (DUONEB) 0.5-2.5 (3) MG/3ML SOLN 3 mLs, Every 4 hours PRN   predniSONE (DELTASONE) 10 mg, See admin instructions    Allergies: NKDA  Past Surgical History: No surgical Hx  Family History:  HTN; mother and sister Colon Cancer: brother Breast Cancer: Sister  Social History:  Lives with niece and nephew Currently not working Tobacco- >20 year smoking hx but no cigarette use in one month EtOH- Reports he occasionally drinks beer and liquor. Does not report daily use but states he drank yesterday. States he gets tremors from withdrawal but no other problems Illicit drug use- rarely uses marijuana.  IADLs/ADLs- can person independently at baseline   Physical Exam: Blood pressure 106/70, pulse 91, temperature 98.5 F (36.9 C), temperature source Oral, resp. rate 14, height 5\' 7"  (1.702 m), weight 59 kg, SpO2 100%. General: NAD HENT: [place] in  place Lungs: diffuse wheezing, decreased breath sounds in the bases Cardiovascular: Distant heart sounds obscured by diffuse wheezing, S1S2 appreciated. Asymmetric pulses with diminished on the left Abdomen: No TTP, normal bowel sounds MSK: no asymmetry, decreased muscle bulk Skin: dry skin, poor feet hygiene.  Neuro: alert and oriented x4 Psych: normal mood and normal affect  Diagnostics:     Latest Ref Rng & Units 01/30/2023    6:15 AM  CBC  WBC 4.0 - 10.5 K/uL 8.5   Hemoglobin 13.0 - 17.0 g/dL 16.1   Hematocrit 09.6 - 52.0 % 42.0   Platelets 150 - 400 K/uL 350        Latest Ref Rng  & Units 01/30/2023    6:15 AM  CMP  Glucose 70 - 99 mg/dL 045   BUN 8 - 23 mg/dL 8   Creatinine 4.09 - 8.11 mg/dL 9.14   Sodium 782 - 956 mmol/L 142   Potassium 3.5 - 5.1 mmol/L 3.7   Chloride 98 - 111 mmol/L 106   CO2 22 - 32 mmol/L 27   Calcium 8.9 - 10.3 mg/dL 9.9   Total Protein 6.5 - 8.1 g/dL 7.5   Total Bilirubin <2.1 mg/dL 0.9   Alkaline Phos 38 - 126 U/L 70   AST 15 - 41 U/L 25   ALT 0 - 44 U/L 19     DG Chest Portable 1 View  Result Date: 01/30/2023 CLINICAL DATA:  67 year old male with respiratory distress. EXAM: PORTABLE CHEST 1 VIEW COMPARISON:  Chest radiographs 01/23/2023 and earlier. FINDINGS: Portable AP upright view at 0612 hours. Chronic hyperinflation and emphysema demonstrated on CTA this year. Mediastinal contours are stable, within normal limits. Mild but increased since October bilateral perihilar and basilar predominant reticular interstitial lung markings. No superimposed pneumothorax, pulmonary edema, pleural effusion or consolidation. Visualized tracheal air column is within normal limits. No acute osseous abnormality identified. Negative visible bowel gas. IMPRESSION: Emphysema (HYQ65-H84.9) with evidence of acute bilateral interstitial infectious exacerbation. No consolidation or pleural effusion. Electronically Signed   By: Odessa Fleming M.D.   On: 01/30/2023 06:42     EKG: personally reviewed my interpretation is sinus rhythm with significant artifact. Repeating  CXR: personally reviewed my interpretation is bilateral infiltrates concerning for multifocal PNA. Chronic emphysematous changes.   Assessment & Plan by Problem:  Present on Admission:  Multifocal pneumonia   Multifocal PNA Pt with 4 day hx of worsening shortness of breath with imaging suggestive of multifocal PNA. He received IV abx in the ED. Will continue Ceftriaxone and Azithromycin for total 5 day course. RVP checked given he has a lymphocytic predominance of his differential suggesting  possibility of viral etiology. Pt is satting well on 4 L of [place] which is improved from his initial Bipap requirement. Blood cultures pending. Will wean oxygen as able. Monitor leukocyte count and fever curve. Pt does have 2/3 symptoms of COPD exacerbation so will treat him for this as well. He is already getting azithromycin for his pneumonia will add 5 doses of prednisone. PE considered but unlikely given no tachypnea or tachycardia.   Lactic acidosis: repeating given elevated istat level. No signs of hypoperfusion at this time. Likely spurious result. If elevated, will give 1 L IVF.   Chronic Problems COPD/Asthma: Currently on nebulizers but can switch to inhalers once pt has good respiratory effort.  HTN: Normotensive this am. Will hold antihypertensive given infection. Will CTM.  Alcohol use disorder: States he can quit whenever he wants. Does not want  naltrexone or other therapies. CIWA without ativan ordered.  Tobacco use disorder: in remission. Congratulated the patient.  Insomnia: will start home Remeron.  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 Given asymmetric pulses and exertional lower extremity pain, will get ABI study.   DVT prophx: Xarelto Diet: HH Bowel: PRN Code: Full  Prior to Admission Living Arrangement: Home Anticipated Discharge Location: Home Barriers to Discharge: Medical Workup  Dispo: Admit patient to Observation with expected length of stay less than 2 midnights.  Gwenevere Abbot, MD Eligha Bridegroom. [facility] Internal Medicine Residency, PGY-3 Pager: [phone]

## 2023-01-30 NOTE — ED Notes (Signed)
ED TO INPATIENT HANDOFF REPORT  ED Nurse Name and Phone #:   S Name/Age/Gender Jason Wong 67 y.o. male Room/Bed: 047C/047C  Code Status   Code Status: Full Code  Home/SNF/Other Home Patient oriented to: self, place, time, and situation Is this baseline? Yes   Triage Complete: Triage complete  Chief Complaint Multifocal pneumonia [J18.9]  Triage Note Pt BIB [facility] from home d/t SOB progressed over the last week worsening this morning. EMS reports finding pt increased work of breathing 98% RA. Was given 2 duo neb, 125 soul medrol, and 2 g mg in route. On arrival to ED pt placed on bipap     Allergies No Known Allergies  Level of Care/Admitting Diagnosis ED Disposition     ED Disposition  Admit   Condition  --   Comment  Hospital Area: [facility] [[id number]]  Level of Care: Telemetry Medical [104]  May place patient in observation at [facility] or [facility] if equivalent level of care is available:: No  Covid Evaluation: Symptomatic Person Under Investigation (PUI) or recent exposure (last 10 days) *Testing Required*  Diagnosis: Multifocal pneumonia [5409811]  Admitting Physician: Earl Lagos [[id number]]  Attending Physician: Earl Lagos [[id number]]          B Medical/Surgery History No past medical history on file.    A IV Location/Drains/Wounds Patient Lines/Drains/Airways Status     Active Line/Drains/Airways     Name Placement date Placement time Site Days   Peripheral IV 01/30/23 20 G Left Wrist 01/30/23  0608  Wrist  less than 1   Peripheral IV 01/30/23 20 G Right Antecubital 01/30/23  0609  Antecubital  less than 1            Intake/Output Last 24 hours  Intake/Output Summary (Last 24 hours) at 01/30/2023 1108 Last data filed at 01/30/2023 0820 Gross per 24 hour  Intake 100.01 ml  Output --  Net 100.01 ml    Labs/Imaging Results for orders placed or performed during the hospital encounter of 01/30/23 (from  the past 48 hour(s))  CBC with Differential     Status: Abnormal   Collection Time: 01/30/23  6:15 AM  Result Value Ref Range   WBC 8.5 4.0 - 10.5 K/uL   RBC 4.25 4.22 - 5.81 MIL/uL   Hemoglobin 13.8 13.0 - 17.0 g/dL   HCT 65.7 84.6 - 96.2 %   MCV 98.8 80.0 - 100.0 fL   MCH 32.5 26.0 - 34.0 pg   MCHC 32.9 30.0 - 36.0 g/dL   RDW 95.2 84.1 - 32.4 %   Platelets 350 150 - 400 K/uL   nRBC 0.0 0.0 - 0.2 %   Neutrophils Relative % 28 %   Neutro Abs 2.4 1.7 - 7.7 K/uL   Lymphocytes Relative 51 %   Lymphs Abs 4.4 (H) 0.7 - 4.0 K/uL   Monocytes Relative 9 %   Monocytes Absolute 0.8 0.1 - 1.0 K/uL   Eosinophils Relative 11 %   Eosinophils Absolute 0.9 (H) 0.0 - 0.5 K/uL   Basophils Relative 1 %   Basophils Absolute 0.1 0.0 - 0.1 K/uL   Immature Granulocytes 0 %   Abs Immature Granulocytes 0.01 0.00 - 0.07 K/uL    Comment: Performed at [facility], [address], [place], [place] [address]  Comprehensive metabolic panel     Status: Abnormal   Collection Time: 01/30/23  6:15 AM  Result Value Ref Range   Sodium 142 135 - 145 mmol/L  Potassium 3.7 3.5 - 5.1 mmol/L   Chloride 106 98 - 111 mmol/L   CO2 27 22 - 32 mmol/L   Glucose, Bld 137 (H) 70 - 99 mg/dL    Comment: Glucose reference range applies only to samples taken after fasting for at least 8 hours.   BUN 8 8 - 23 mg/dL   Creatinine, Ser 4.54 0.61 - 1.24 mg/dL   Calcium 9.9 8.9 - 09.8 mg/dL   Total Protein 7.5 6.5 - 8.1 g/dL   Albumin 4.3 3.5 - 5.0 g/dL   AST 25 15 - 41 U/L   ALT 19 0 - 44 U/L   Alkaline Phosphatase 70 38 - 126 U/L   Total Bilirubin 0.9 <1.2 mg/dL   GFR, Estimated >11 >91 mL/min    Comment: (NOTE) Calculated using the CKD-EPI Creatinine Equation (2021)    Anion gap 9 5 - 15    Comment: Performed at [facility], [address], [place], [place] [address]  I-Stat CG4 Lactic Acid, ED     Status: Abnormal   Collection Time: 01/30/23 10:46 AM  Result Value Ref Range   Lactic Acid, Venous 2.6  (HH) 0.5 - 1.9 mmol/L   Comment NOTIFIED PHYSICIAN    DG Chest Portable 1 View  Result Date: 01/30/2023 CLINICAL DATA:  67 year old male with respiratory distress. EXAM: PORTABLE CHEST 1 VIEW COMPARISON:  Chest radiographs 01/23/2023 and earlier. FINDINGS: Portable AP upright view at 0612 hours. Chronic hyperinflation and emphysema demonstrated on CTA this year. Mediastinal contours are stable, within normal limits. Mild but increased since October bilateral perihilar and basilar predominant reticular interstitial lung markings. No superimposed pneumothorax, pulmonary edema, pleural effusion or consolidation. Visualized tracheal air column is within normal limits. No acute osseous abnormality identified. Negative visible bowel gas. IMPRESSION: Emphysema ([id number]) with evidence of acute bilateral interstitial infectious exacerbation. No consolidation or pleural effusion. Electronically Signed   By: Odessa Fleming M.D.   On: 01/30/2023 06:42    Pending Labs Unresulted Labs (From admission, onward)     Start     Ordered   01/31/23 0500  Basic metabolic panel  Tomorrow morning,   R        01/30/23 1031   01/31/23 0500  CBC  Tomorrow morning,   R        01/30/23 1031   01/30/23 1054  Lactic acid, plasma  (Lactic Acid)  STAT Now then every 3 hours,   R (with STAT occurrences)      01/30/23 1053   01/30/23 1033  Blood gas, venous  Once,   R        01/30/23 1032   01/30/23 1029  HIV Antibody (routine testing w rflx)  (HIV Antibody (Routine testing w reflex) panel)  Once,   R        01/30/23 1031   01/30/23 0924  Resp panel by RT-PCR (RSV, Flu A&B, Covid) Anterior Nasal Swab  (SARS Coronavirus 2 by RT PCR (hospital order, performed in [facility] hospital lab) *cepheid single result test*)  Once,   URGENT        01/30/23 0923            Vitals/Pain Today's Vitals   01/30/23 0645 01/30/23 0817 01/30/23 0900 01/30/23 1035  BP:   106/70   Pulse:   91   Resp:   14   Temp: 98 F (36.7 C)   98.5  F (36.9 C)  TempSrc: Axillary   Oral  SpO2:  100%   Weight:      Height:      PainSc:  2       Isolation Precautions Airborne and Contact precautions  Medications Medications  rivaroxaban (XARELTO) tablet 10 mg (has no administration in time range)  acetaminophen (TYLENOL) tablet 650 mg (has no administration in time range)    Or  acetaminophen (TYLENOL) suppository 650 mg (has no administration in time range)  senna-docusate (Senokot-S) tablet 1 tablet (has no administration in time range)  albuterol (PROVENTIL) (2.5 MG/3ML) 0.083% nebulizer solution 2.5 mg (has no administration in time range)  ipratropium (ATROVENT) nebulizer solution 0.5 mg (has no administration in time range)  budesonide (PULMICORT) nebulizer solution 0.25 mg (has no administration in time range)  azithromycin (ZITHROMAX) 250 mg in dextrose 5 % 125 mL IVPB (has no administration in time range)  cefTRIAXone (ROCEPHIN) 2 g in sodium chloride 0.9 % 100 mL IVPB (has no administration in time range)  ipratropium-albuterol (DUONEB) 0.5-2.5 (3) MG/3ML nebulizer solution 3 mL (3 mLs Nebulization Given 01/30/23 0632)  cefTRIAXone (ROCEPHIN) 2 g in sodium chloride 0.9 % 100 mL IVPB (0 g Intravenous Stopped 01/30/23 0820)  azithromycin (ZITHROMAX) 500 mg in dextrose 5 % 250 mL IVPB (0 mg Intravenous Stopped 01/30/23 0917)  acetaminophen (TYLENOL) tablet 650 mg (650 mg Oral Given 01/30/23 0820)    Mobility walks with person assist     Focused Assessments    R Recommendations: See Admitting Provider Note  Report given to:   Additional Notes:

## 2023-01-30 NOTE — ED Notes (Signed)
Removed Bi pap at this time. Placed on 4L via [place].

## 2023-01-31 ENCOUNTER — Other Ambulatory Visit (HOSPITAL_COMMUNITY): Payer: Self-pay

## 2023-01-31 ENCOUNTER — Other Ambulatory Visit: Payer: Self-pay | Admitting: Student

## 2023-01-31 ENCOUNTER — Observation Stay (HOSPITAL_BASED_OUTPATIENT_CLINIC_OR_DEPARTMENT_OTHER): Payer: Medicare Other

## 2023-01-31 DIAGNOSIS — G4709 Other insomnia: Secondary | ICD-10-CM

## 2023-01-31 DIAGNOSIS — R630 Anorexia: Secondary | ICD-10-CM

## 2023-01-31 DIAGNOSIS — I7090 Unspecified atherosclerosis: Secondary | ICD-10-CM

## 2023-01-31 DIAGNOSIS — J189 Pneumonia, unspecified organism: Secondary | ICD-10-CM

## 2023-01-31 LAB — COMPREHENSIVE METABOLIC PANEL
ALT: 18 U/L (ref 0–44)
AST: 20 U/L (ref 15–41)
Albumin: 3.5 g/dL (ref 3.5–5.0)
Alkaline Phosphatase: 57 U/L (ref 38–126)
Anion gap: 9 (ref 5–15)
BUN: 10 mg/dL (ref 8–23)
CO2: 22 mmol/L (ref 22–32)
Calcium: 9.5 mg/dL (ref 8.9–10.3)
Chloride: 111 mmol/L (ref 98–111)
Creatinine, Ser: 0.93 mg/dL (ref 0.61–1.24)
GFR, Estimated: >60 mL/min (ref >60)
Glucose, Bld: 135 mg/dL — ABNORMAL HIGH (ref 70–99)
Potassium: 4.4 mmol/L (ref 3.5–5.1)
Sodium: 142 mmol/L (ref 135–145)
Total Bilirubin: 0.5 mg/dL (ref <1.2)
Total Protein: 6.4 g/dL — ABNORMAL LOW (ref 6.5–8.1)

## 2023-01-31 LAB — CBC
HCT: 36 % — ABNORMAL LOW (ref 39.0–52.0)
Hemoglobin: 12 g/dL — ABNORMAL LOW (ref 13.0–17.0)
MCH: 32.2 pg (ref 26.0–34.0)
MCHC: 33.3 g/dL (ref 30.0–36.0)
MCV: 96.5 fL (ref 80.0–100.0)
Platelets: 303 10*3/uL (ref 150–400)
RBC: 3.73 MIL/uL — ABNORMAL LOW (ref 4.22–5.81)
RDW: 14.6 % (ref 11.5–15.5)
WBC: 6.3 10*3/uL (ref 4.0–10.5)
nRBC: 0 % (ref 0.0–0.2)

## 2023-01-31 LAB — LACTIC ACID, PLASMA
Lactic Acid, Venous: 2.6 mmol/L (ref 0.5–1.9)
Lactic Acid, Venous: 1.6 mmol/L (ref 0.5–1.9)

## 2023-01-31 LAB — VAS US ABI WITH/WO TBI
Left ABI: 1.12
Right ABI: 1.22

## 2023-01-31 LAB — PROCALCITONIN: Procalcitonin: <0.1 ng/mL

## 2023-01-31 LAB — GLUCOSE, CAPILLARY: Glucose-Capillary: 153 mg/dL — ABNORMAL HIGH (ref 70–99)

## 2023-01-31 MED ORDER — MIRTAZAPINE 7.5 MG PO TABS
7.5000 mg | ORAL_TABLET | Freq: Every day | ORAL | 0 refills | Status: DC
  Filled 2023-01-31 – 2023-02-01 (×2): qty 30, 30d supply, fill #0

## 2023-01-31 MED ORDER — PREDNISONE 20 MG PO TABS
40.0000 mg | ORAL_TABLET | Freq: Every day | ORAL | 0 refills | Status: AC
  Filled 2023-01-31: qty 6, 3d supply, fill #0

## 2023-01-31 MED ORDER — AZITHROMYCIN 250 MG PO TABS
250.0000 mg | ORAL_TABLET | Freq: Every day | ORAL | 0 refills | Status: AC
  Filled 2023-01-31: qty 3, 3d supply, fill #0

## 2023-01-31 NOTE — Progress Notes (Signed)
At 1524   Pt has returned to the unit, pt's primary RN plans to give pt his scheduled neb prior to discharge.

## 2023-01-31 NOTE — Progress Notes (Signed)
Neb has been given, pt has signed the rider waiver and release of liability form. Pt feeling fine, no SOB, moving about the room without C/O. Discharge instructions reviewed with pt.  Copy of instructions given to pt, [facility] has filled scripts and will be picked up on the way to the discharge lounge, where the pt will wait for the taxi to arrive.  Pt to be d/c'd via wheelchair with belongings, escorted by staff.   Janaiyah Blackard,RN SWOT

## 2023-01-31 NOTE — Progress Notes (Signed)
Nurse requested Mobility Specialist to perform oxygen saturation test with pt which includes removing pt from oxygen both at rest and while ambulating.  Below are the results from that testing.     Patient Saturations on Room Air at Rest = spO2 95%  Patient Saturations on Room Air while Ambulating = sp02 90%   Patient Saturations on N/A Liters of oxygen while Ambulating = sp02 N/A%  At end of testing pt left in room on RA .  Reported results to nurse.   Addison Lank Mobility Specialist Please contact via SecureChat or  Rehab office at [phone]

## 2023-01-31 NOTE — Discharge Summary (Addendum)
Name: Jason Wong MRN: [id number] DOB: 12-14-1955 67 y.o. PCP: Pcp, No  Date of Admission: 01/30/2023  6:06 AM Date of Discharge: 01/31/2023 6:38 PM Attending Physician: Dr. Heide Spark  Discharge Diagnosis: Principal Problem:   Multifocal pneumonia Active Problems:   COPD with acute exacerbation [facility])    Discharge Medications: Allergies as of 01/31/2023   No Known Allergies      Medication List     STOP taking these medications    amoxicillin-clavulanate 875-125 MG tablet Commonly known as: AUGMENTIN       TAKE these medications    albuterol 108 (90 Base) MCG/ACT inhaler Commonly known as: VENTOLIN HFA Inhale 2 puffs into the lungs every 4 (four) hours as needed for wheezing or shortness of breath.   amLODipine 5 MG tablet Commonly known as: NORVASC Take 5 mg by mouth daily.   atorvastatin 10 MG tablet Commonly known as: LIPITOR Take 10 mg by mouth daily.   azithromycin 250 MG tablet Commonly known as: ZITHROMAX Take 1 tablet (250 mg total) by mouth daily for 3 days. Start taking on: February 01, 2023   ipratropium-albuterol 0.5-2.5 (3) MG/3ML Soln Commonly known as: DUONEB Take 3 mLs by nebulization every 4 (four) hours as needed (wheezing, shortness of breath).   predniSONE 20 MG tablet Commonly known as: DELTASONE Take 2 tablets (40 mg total) by mouth daily with breakfast for 3 days. Start taking on: February 01, 2023 What changed:  medication strength how much to take when to take this   Trelegy Ellipta 200-62.5-25 MCG/ACT Aepb Generic drug: Fluticasone-Umeclidin-Vilant Inhale 1 puff into the lungs daily.        Disposition and follow-up:   Jason Wong was discharged from [facility] in Stable condition.  At the hospital follow up visit please address:  1.  Follow-up:  COPD - On home Trelegy  and Duonebs - Kindly assess his medication compliance  - Please continue counseling on inhaler use technique   2.   Labs / imaging needed at time of follow-up: CBC. BMP   3.  Pending labs/ test needing follow-up: N/A  4.  Medication Changes  ADDED  - Azithromycin : take one pill starting tomorrow 11/13 - 11/15 -Prednisone: take one pill starting tomorrow 11/13 -11/15  Follow-up Appointments:  Hospital Course by problem list:  #Multifocal Pneumonia  This is a 67 year old male with a history of COPD and asthma, hypertension hyperlipidemia who presented due to concerns for shortness of breath, found to have multifocal pneumonia on chest x-ray, which seems fairly unchanged from previous x-rays during hospital admission. Patient was started on azithromycin and ceftriaxone.On day 2 of this admission , patient was still afebrile and didn't have leucocytosis. Lactic acid resolved and pro calcitonin was normal. Patient didn't require any oxygen during orthostatics and good saturations. Antibiotics (Rocephin) were discontinued and patient was discharged.   # COPD exacerbation Patient has a history of COPD on home albuterol presented on 01/30/2023 due to concerns for shortness of breath found to have acute COPD exacerbation.  Patient was given breathing treatment which improved his oxygen saturations.  On 01/31/2023, patient was no longer requiring supplemental oxygen and had no positive orthostatic signs.He was discharged on Azithromycin and Prednisone to be completed through to 11/15.  Discharge Subjective:  Patient is seen at bedside. He was sitting on his bed in no acute distress. Had 2 L [place] but weaned to RA and was sating at 100%. He didn't have any concerns and denied any shortness  of breath or any chest pain.   Discharge Exam:   Blood pressure 112/79, pulse 81, temperature 98.3 F (36.8 C), temperature source Oral, resp. rate 18, height 5\' 7"  (1.702 m), weight 52.9 kg, SpO2 97%.  Constitutional:well-appearing man, sitting on bed , in no acute distress HENT: normocephalic atraumatic, mucous membranes  moist Cardiovascular: regular rate and rhythm, no m/r/g, Pulmonary/Chest: normal work of breathing on room air, lungs clear to auscultation bilaterally. No wheezing Abdominal: soft, non-tender, non-distended. Neurological: alert & oriented x 3 MSK: no gross abnormalities. Skin: warm and dry Psych: Normal mood and affect  Pertinent Labs, Studies, and Procedures:     Latest Ref Rng & Units 01/31/2023    4:26 AM 01/30/2023    6:15 AM  CBC  WBC 4.0 - 10.5 K/uL 6.3  8.5   Hemoglobin 13.0 - 17.0 g/dL 82.9  56.2   Hematocrit 39.0 - 52.0 % 36.0  42.0   Platelets 150 - 400 K/uL 303  350        Latest Ref Rng & Units 01/31/2023    4:26 AM 01/30/2023    6:15 AM  CMP  Glucose 70 - 99 mg/dL 130  865   BUN 8 - 23 mg/dL 10  8   Creatinine 7.84 - 1.24 mg/dL 6.96  2.95   Sodium 284 - 145 mmol/L 142  142   Potassium 3.5 - 5.1 mmol/L 4.4  3.7   Chloride 98 - 111 mmol/L 111  106   CO2 22 - 32 mmol/L 22  27   Calcium 8.9 - 10.3 mg/dL 9.5  9.9   Total Protein 6.5 - 8.1 g/dL 6.4  7.5   Total Bilirubin <1.2 mg/dL 0.5  0.9   Alkaline Phos 38 - 126 U/L 57  70   AST 15 - 41 U/L 20  25   ALT 0 - 44 U/L 18  19     DG Chest Portable 1 View  Result Date: 01/30/2023 CLINICAL DATA:  67 year old male with respiratory distress. EXAM: PORTABLE CHEST 1 VIEW COMPARISON:  Chest radiographs 01/23/2023 and earlier. FINDINGS: Portable AP upright view at 0612 hours. Chronic hyperinflation and emphysema demonstrated on CTA this year. Mediastinal contours are stable, within normal limits. Mild but increased since October bilateral perihilar and basilar predominant reticular interstitial lung markings. No superimposed pneumothorax, pulmonary edema, pleural effusion or consolidation. Visualized tracheal air column is within normal limits. No acute osseous abnormality identified. Negative visible bowel gas. IMPRESSION: Emphysema ([id number]) with evidence of acute bilateral interstitial infectious exacerbation. No  consolidation or pleural effusion. Electronically Signed   By: Odessa Fleming M.D.   On: 01/30/2023 06:42     Discharge Instructions: Discharge Instructions     Call MD for:  difficulty breathing, headache or visual disturbances   Complete by: As directed    Call MD for:  extreme fatigue   Complete by: As directed    Call MD for:  hives   Complete by: As directed    Call MD for:  persistant dizziness or light-headedness   Complete by: As directed    Call MD for:  persistant nausea and vomiting   Complete by: As directed    Call MD for:  redness, tenderness, or signs of infection (pain, swelling, redness, odor or green/yellow discharge around incision site)   Complete by: As directed    Call MD for:  severe uncontrolled pain   Complete by: As directed    Call MD for:  temperature >100.4  Complete by: As directed    Diet - low sodium heart healthy   Complete by: As directed    Discharge instructions   Complete by: As directed    Dear Mr Goscinski , It was a pleasure taking care of you at [facility]. You were admitted for for shortness of breath and cough and treated for possible lung infection and worsening of your COPD. We did tests in the hospital to evaluate whether you need oxygen, but, fortunately, you do not.  We are discharging you home now that you are doing better. Please follow the following instructions:  1) For your history of COPD: - Azithromycin : take one pill starting tomorrow 11/13 - 11/15 -Prednisone: take one pill starting tomorrow 11/13 -11/15 -Continue your Trelegy inhaler daily -Continue your duonebs  Please make an appointment with your primary care doctor, Dr. Jonah Blue in the next 1-2 weeks to follow up with your symptoms and manage your chronic diseases.   Take care,  Dr. Kathleen Lime, MD Morene Crocker, MD   Increase activity slowly   Complete by: As directed        Signed: Kathleen Lime, MD [facility] -  PGY1 Pager: [phone] 01/31/2023, 6:38 PM    Please contact the on call pager after 5 pm and on weekends at [phone].

## 2023-01-31 NOTE — Progress Notes (Signed)
Mobility Specialist Progress Note:   01/31/23 0930  Mobility  Activity Ambulated with assistance in hallway  Level of Assistance Contact guard assist, steadying assist  Assistive Device None  Distance Ambulated (ft) 120 ft  Activity Response Tolerated well  Mobility Referral Yes  $Mobility charge 1 Mobility  Mobility Specialist Start Time (ACUTE ONLY) 0930  Mobility Specialist Stop Time (ACUTE ONLY) 0945  Mobility Specialist Time Calculation (min) (ACUTE ONLY) 15 min   Pt agreeable to mobility session. Required steadying assist throughout ambulation with no AD. SpO2 WFL on RA throughout session when good pleth. Pt back in bed with all needs met, encouraged frequent ambulation.   Addison Lank Mobility Specialist Please contact via SecureChat or  Rehab office at [phone]

## 2023-01-31 NOTE — Progress Notes (Signed)
Pt still in vascular lab, ABI are done, pt waiting for transport to bring him back up to the unit. Pt's primary RN Isabelle Course reached out to vascular lab and they confirmed this.  Pt's primary RN has reached back out to unit RNCM to offer the taxi voucher back to pt (pt had refused earlier and wanted a bus pass), pt has ambulated with mobility team twice today and sats have remained 90% and above. Pt c/o some SOB, stated to primary nurse he didn't think he could go today, MD was reached out to and pt will still go home today. MD instructed for pt to use his MDI and nebulizer prn as instructed, and complete the antibiotics and steroids prescribed.   Waiting for pt to return to the unit and d/c instructions will be given. And taxi will be arranged to take pt home.   Mikeria Valin,RN SWOT

## 2023-01-31 NOTE — Progress Notes (Signed)
Mobility Specialist Progress Note:   01/31/23 1400  Mobility  Activity Ambulated with assistance in hallway  Level of Assistance Contact guard assist, steadying assist  Assistive Device None  Distance Ambulated (ft) 60 ft  Activity Response Tolerated fair  Mobility Referral Yes  $Mobility charge 1 Mobility  Mobility Specialist Start Time (ACUTE ONLY) 1400  Mobility Specialist Stop Time (ACUTE ONLY) 1415  Mobility Specialist Time Calculation (min) (ACUTE ONLY) 15 min   Pt requested second mobility session. Required only minG assist during ambulation. SpO2 98% throughout on room air. Pt c/o significant SOB. RN aware. Back in bed with all needs met.   Addison Lank Mobility Specialist Please contact via SecureChat or  Rehab office at [phone]

## 2023-01-31 NOTE — TOC Transition Note (Addendum)
Transition of Care [facility]) - CM/SW Discharge Note   Patient Details  Name: Jason Wong MRN: [id number] Date of Birth: Oct 05, 1955  [facility]) CM/SW Contact:  Tom-Johnson, Hershal Coria, RN Phone Number: 01/31/2023, 2:29 PM   Clinical Narrative:     Patient is scheduled for discharge today.  Hospital f/u and discharge instructions on AVS. Prescriptions sent to [facility] pharmacy and meds will be delivered to patient at bedside prior discharge. Buss Pass given to RN for transportation at discharge.  No further TOC needs noted.  15:20- CM notified by RN that patient requesting a cab voucher instead of Bus Pass d/t shortness of breath on exertion. Home a little away from Bus stop. Therapist, nutritional and Release of Liability form explained to patient with understanding verbalized, form signed and placed in patient's chart. Cab voucher given to RN.   Non further TOC needs noted .      Final next level of care: Home/Self Care Barriers to Discharge: Barriers Resolved   Patient Goals and CMS Choice CMS Medicare.gov Compare Post Acute Care list provided to:: Patient Choice offered to / list presented to : Patient  Discharge Placement                  Patient to be transferred to facility by: [facility]      Discharge Plan and Services Additional resources added to the After Visit Summary for                  DME Arranged: N/A DME Agency: NA       HH Arranged: NA HH Agency: NA        Social Determinants of Health (SDOH) Interventions SDOH Screenings   Food Insecurity: No Food Insecurity (01/30/2023)  Housing: Low Risk  (01/30/2023)  Transportation Needs: Unmet Transportation Needs (01/30/2023)  Utilities: Not At Risk (01/30/2023)  Tobacco Use: Low Risk  (01/30/2023)     Readmission Risk Interventions     No data to display

## 2023-01-31 NOTE — Care Management Obs Status (Signed)
MEDICARE OBSERVATION STATUS NOTIFICATION   Patient Details  Name: Jason Wong MRN: [id number] Date of Birth: 26-May-1955   Medicare Observation Status Notification Given:  Yes    Tom-Johnson, Hershal Coria, RN 01/31/2023, 2:27 PM

## 2023-01-31 NOTE — Plan of Care (Signed)

## 2023-01-31 NOTE — Hospital Course (Addendum)
Hospital Course by problem list: #Multifocal Pneumonia  This is a 67 year old male with a history of COPD and asthma, hypertension hyperlipidemia who presented due to concerns for shortness of breath, found to have multifocal pneumonia on chest x-ray, which seems fairly unchanged from previous x-rays during hospital admission. Patient was started on azithromycin and ceftriaxone.On day 2 of this admission , patient was still afebrile and didn't have leucocytosis. Lactic acid resolved and pro calcitonin was normal. Patient didn't require any oxygen during orthostatics and good saturations and was stable to discharge at this time.   # COPD exacerbation Patient has a history of COPD on home albuterol presented on 01/30/2023 due to concerns for shortness of breath found to have acute COPD exacerbation.  Patient was given breathing treatment which improved his oxygen saturation.  On 01/31/2023, patient was no longer requiring oxygen and was stable did great and had no positive orthostatic signs.  Patient was stable for discharge at this time  Dear Mr Belflower , It was a pleasure taking care of you at [facility]. You were admitted for for shortness of breath and cough and treated for possible lung infection and worsening of your COPD. We did tests in the hospital to evaluate whether you need oxygen, but, fortunately, you do not.  We are discharging you home now that you are doing better. Please follow the following instructions:  1) For your history of COPD: - Azithromycin : take one pill starting tomorrow 11/13 - 11/15 -Prednisone: take one pill starting tomorrow 11/13 -11/15 -Continue your Trelegy inhaler daily -Continue your duonebs  Please make an appointment with your primary care doctor, Dr. Jonah Blue in the next 1-2 weeks to follow up with your symptoms and manage your chronic diseases.   Take care,  Dr. Kathleen Lime, MD Morene Crocker, MD

## 2023-01-31 NOTE — Progress Notes (Signed)
Ankle-brachial index completed. Please see CV Procedures for preliminary results.  Shona Simpson, RVT 01/31/23 2:48 PM

## 2023-01-31 NOTE — Progress Notes (Signed)
HD#0 SUBJECTIVE:  Patient Summary: Jason Wong is a 67 y.o. with a pertinent PMH of  HTN, HLD, COPD, Asthma, alcohol use disorder who presented with  worsening shortness of breath and non productive cough for the past 4-days ,found to have multifocal pneumonia, started on Zithromax and Rocephin, also found to have COPD exacerbations started on Pulmicort  and duoneb and prednisone.  Overnight Events: No events overnight   Interim History: Patient is seen at bedside. He was sitting on his bed in no acute distress. Had 2 L [place] but weaned to RA and was sating at 100%. He didn't have any concerns and denied any shortness of breath or any chest pain.   OBJECTIVE:  Vital Signs: Vitals:   01/31/23 0500 01/31/23 0637 01/31/23 0757 01/31/23 0810  BP:  (!) 124/90 112/79   Pulse:  69 81   Resp:   18   Temp:  98.1 F (36.7 C) 98.3 F (36.8 C)   TempSrc:  Oral Oral   SpO2:  100% 99% 97%  Weight: 52.9 kg     Height:       Supplemental O2: Room Air SpO2: 97 % O2 Flow Rate (L/min): 2 L/min FiO2 (%): 40 %  Filed Weights   01/30/23 0614 01/31/23 0500  Weight: 59 kg 52.9 kg     Intake/Output Summary (Last 24 hours) at 01/31/2023 0923 Last data filed at 01/31/2023 0553 Gross per 24 hour  Intake 1348 ml  Output 200 ml  Net 1148 ml   Net IO Since Admission: 1,248.01 mL [01/31/23 0923]  Physical Exam: Physical Exam Constitutional:      General: He is not in acute distress.    Appearance: Normal appearance. He is not ill-appearing, toxic-appearing or diaphoretic.  HENT:     Head: Normocephalic.  Cardiovascular:     Rate and Rhythm: Normal rate and regular rhythm.  Pulmonary:     Effort: No respiratory distress.     Breath sounds: No stridor. No wheezing.     Comments: No wheezing appreciated on exam  Skin:    General: Skin is warm and dry.  Neurological:     General: No focal deficit present.     Mental Status: He is alert.  Psychiatric:        Mood and Affect: Mood normal.         Behavior: Behavior normal.     Patient Lines/Drains/Airways Status     Active Line/Drains/Airways     Name Placement date Placement time Site Days   Peripheral IV 01/30/23 20 G Left Wrist 01/30/23  0608  Wrist  1   Peripheral IV 01/30/23 20 G Right Antecubital 01/30/23  0609  Antecubital  1             ASSESSMENT/PLAN:  Assessment: Principal Problem:   Multifocal pneumonia Active Problems:   COPD with acute exacerbation (HCC)   Plan: #Multifocal Pneumonia  This is a 67 year old male with a history of COPD and asthma, hypertension hyperlipidemia who presented due to concerns for shortness of breath, found to have multifocal pneumonia on chest x-ray, which seems fairly unchanged from previous x-rays during hospital admission. Lactic acidosis resolved to 1.6 this morning. Patient is afebrile, white count is within normal limits, nonoxygen requiring this morning, but he does endorse dry cough.,  Started on azithromycin and ceftriaxone yesterday. Wondering if there is an active infectious processes going on in this patient at this time or not.  Will get a procalcitonin levels  today  and if it comes back normal, will consider stopping ceftriaxone and continue the patient on azithromycin.   # COPD exacerbation Patient has a history of COPD and asthma, on home Trelegy Ellipta and albuterol, says he uses his albuterol at least once a day came in due to shortness of breath concerning for his COPD exacerbation, endorses cough but no sputum production.  Patient is currently on DuoNebs and prednisone and seems to be doing well today.  I did not appreciate any wheezing on exam and the patient is not requiring any oxygen at this time.  Will continue breathing treatment today and provide oxygen supplementation if needed. Will also get orthostatic vitals today and assess his need for oxygen on exertion  # Chronic conditions Hypertension - Patient has been normotensive throughout this  admission we will continue to monitor and resume home antihypertensive if needed  Alcohol use disorder -Patient does not think he has an issue with alcohol at this time and has declined medical therapies for alcohol cessation.  It is unclear when he had his last drink but he did not seems to be withdrawing at this time.  Will however continue on CIWA protocol without Ativan and we counseled patient on substance abuse when appropriate.  Insomnia - On home Remeron , resumed  Hyperlipidemia/peripheral artery disease - Continue home Lipitor 10 mg  Best Practice: Diet: Regular diet VTE: rivaroxaban (XARELTO) tablet 10 mg Start: 01/30/23 1045 Code: Full AB: Rocephin and Azithromycin Family Contact: EDIL, RUFUS [phone], to be notified. DISPO: Anticipated discharge today to Home pending  medical work up and treatment  .  Signature: Kathleen Lime , MD Internal Medicine Resident, PGY-1 [facility]  Pager: [phone] 9:23 AM, 01/31/2023   Please contact the on call pager after 5 pm and on weekends at [phone].

## 2023-01-31 NOTE — Progress Notes (Signed)
Pt's friend called, pt will not need taxi to take home, friend coming.

## 2023-02-01 ENCOUNTER — Other Ambulatory Visit: Payer: Self-pay

## 2023-02-01 ENCOUNTER — Ambulatory Visit: Payer: Medicare Other | Attending: Internal Medicine | Admitting: Physician Assistant

## 2023-02-01 ENCOUNTER — Encounter: Payer: Self-pay | Admitting: Physician Assistant

## 2023-02-01 VITALS — BP 130/88 | HR 86 | Wt 122.0 lb

## 2023-02-01 DIAGNOSIS — J069 Acute upper respiratory infection, unspecified: Secondary | ICD-10-CM | POA: Diagnosis not present

## 2023-02-01 DIAGNOSIS — Z09 Encounter for follow-up examination after completed treatment for conditions other than malignant neoplasm: Secondary | ICD-10-CM

## 2023-02-01 DIAGNOSIS — J432 Centrilobular emphysema: Secondary | ICD-10-CM | POA: Diagnosis not present

## 2023-02-01 DIAGNOSIS — Z23 Encounter for immunization: Secondary | ICD-10-CM

## 2023-02-01 MED ORDER — ALBUTEROL SULFATE HFA 108 (90 BASE) MCG/ACT IN AERS
2.0000 | INHALATION_SPRAY | RESPIRATORY_TRACT | 3 refills | Status: DC | PRN
  Filled 2023-02-01: qty 18, fill #0
  Filled 2023-02-09 (×2): qty 18, 16d supply, fill #0

## 2023-02-01 NOTE — Progress Notes (Signed)
Patient ID: Jason Wong, male   DOB: 10/20/1955, 67 y.o.   MRN: [id number]    Jason Wong, is a 67 y.o. male  [id number]  [id number]  DOB - [birth year]  Chief Complaint  Patient presents with   Hospitalization Follow-up       Subjective:   Jason Wong is a 67 y.o. male here today for a follow up visit after being seen at the ED 11/5 with SOB/wheezing and covered with prednisone, augmentin and given a dose of solumedrol.  He is feeling much better.  He is using his trelegy but needs an albuterol. No fever.  Appetite a little improved.  Wants to get flu shot.    From ED note: Seen at ED multiple times-most recently for COPD exacerbation: Jason Wong is a 67 y.o. male.   67 year old male presents today for concern of worsening shortness of breath for the past 3 days.  Particularly worse with exertion.  No prior history of CHF.  Does have history of COPD.  Feels similar to his previous COPD exacerbations.  No chest pain, lightheadedness, or palpitations.  He is out of his albuterol inhaler.  Denies fever or worsening cough.  Does report chronic productive cough though.  From A/P: This patient presents to the ED for concern of shortness of breath, this involves an extensive number of treatment options, and is a complaint that carries with it a high risk of complications and morbidity.  The differential diagnosis includes COPD exacerbation, CHF exacerbation, ACS, PE, pneumonia   MDM: 67 year old male presents today for concern of worsening shortness of breath for the past 3 days.  Has history of COPD.  States this feels similar.  I ambulated the patient within the room.  No hypoxia.  Hemodynamically stable.  Consistent with his COPD exacerbation.  Improved after dose of steroids and DuoNeb in the emergency department.  Albuterol inhaler provided.  Course of Augmentin and prednisone given.  He is stable for discharge.  Discharged in stable condition.  Return precaution discussed.   Patient voices understanding and is in agreement with plan.   No evidence of ACS given no anginal symptoms particularly no chest pain.  No prior history of CHF making CHF exacerbation unlikely.  No prior history of DVT or PE.  No evidence of pneumonia on chest x-ray.  No problems updated.  ALLERGIES: Allergies  Allergen Reactions   Tobacco Shortness Of Breath and Other (See Comments)    CIGARETTE SMOKE TRIGGERS THE PATIENT'S RESPIRATORY ISSUES!!    PAST MEDICAL HISTORY: Past Medical History:  Diagnosis Date   Asthma    COPD (chronic obstructive pulmonary disease) (HCC)    Hepatitis C antibody positive in blood    HLD (hyperlipidemia)    HTN (hypertension)    On home oxygen therapy    per pt 08-31-2021 uses 02 about every other day    MEDICATIONS AT HOME: Prior to Admission medications   Medication Sig Start Date End Date Taking? Authorizing Provider  acetaminophen (TYLENOL) 500 MG tablet Take 500 mg by mouth every 6 (six) hours as needed for moderate pain.   Yes [provider]  amLODipine (NORVASC) 5 MG tablet Take 1 tablet (5 mg total) by mouth daily. 06/27/22  Yes Claiborne Rigg, NP  amLODipine (NORVASC) 5 MG tablet Take 1 tablet (5 mg total) by mouth daily. 11/17/22  Yes Roemhildt, Lorin T, PA-C  atorvastatin (LIPITOR) 10 MG tablet Take 1 tablet (10 mg total) by mouth daily. 06/27/22  Yes Bertram Denver  W, NP  famotidine (PEPCID) 20 MG tablet Take 1 tablet (20 mg total) by mouth 2 (two) times daily. 01/17/23  Yes Renne Crigler, PA-C  Fluticasone-Umeclidin-Vilant (TRELEGY ELLIPTA) 200-62.5-25 MCG/ACT AEPB Inhale 1 puff into the lungs daily. 11/12/22  Yes Theresia Lo, Benetta Spar K, DO  Fluticasone-Umeclidin-Vilant (TRELEGY ELLIPTA) 200-62.5-25 MCG/ACT AEPB Inhale 1 puff into the lungs daily. 11/17/22  Yes Roemhildt, Lorin T, PA-C  ipratropium-albuterol (DUONEB) 0.5-2.5 (3) MG/3ML SOLN Take 3 mLs by nebulization every 6 (six) hours as needed. 11/17/22  Yes Roemhildt, Lorin T, PA-C   mirtazapine (REMERON) 7.5 MG tablet Take 1 tablet (7.5 mg total) by mouth at bedtime. 01/31/23 03/03/23 Yes Morene Crocker, MD  oxyCODONE (OXY IR/ROXICODONE) 5 MG immediate release tablet Take 1 tablet (5 mg total) by mouth every 6 (six) hours as needed for severe pain (pain score 7-10). 01/17/23  Yes Renne Crigler, PA-C  pantoprazole (PROTONIX) 40 MG tablet Take 1 tablet (40 mg total) by mouth daily. 01/17/23  Yes Renne Crigler, PA-C  sucralfate (CARAFATE) 1 g tablet Take 1 tablet (1 g total) by mouth 4 (four) times daily -  with meals and at bedtime. 01/17/23  Yes Renne Crigler, PA-C  thiamine (VITAMIN B-1) 100 MG tablet Take 1 tablet (100 mg total) by mouth daily. 06/24/22  Yes Vann, Jessica U, DO  albuterol (PROAIR HFA) 108 (90 Base) MCG/ACT inhaler Inhale 2 puffs into the lungs once every 4 (four) hours as needed for wheezing or shortness of breath. 02/01/23   Sharlot Sturkey, Marzella Schlein, PA-C    ROS: Neg HEENT Neg resp Neg cardiac Neg GI Neg GU Neg MS Neg psych Neg neuro  Objective:   Vitals:   02/01/23 0900  BP: 130/88  Pulse: 86  SpO2: 98%  Weight: 122 lb (55.3 kg)   Exam General appearance : Awake, alert, not in any distress. Speech Clear. Not toxic looking HEENT: Atraumatic and Normocephalic Neck: Supple, no JVD. No cervical lymphadenopathy.  Chest: fair air entry bilaterally, .  No rales/rhonchi.  There is mild wheezing throughout CVS: S1 S2 regular, no murmurs.  Extremities: B/L Lower Ext shows no edema, both legs are warm to touch Neurology: Awake alert, and oriented X 3, CN II-XII intact, Non focal Skin: No Rash  Data Review Lab Results  Component Value Date   HGBA1C 5.5 05/09/2022   HGBA1C 5.7 (H) 08/28/2017   HGBA1C 5.7 01/24/2017    Assessment & Plan   1. Centrilobular emphysema (HCC) - albuterol (PROAIR HFA) 108 (90 Base) MCG/ACT inhaler; Inhale 2 puffs into the lungs once every 4 (four) hours as needed for wheezing or shortness of breath.  Dispense:  18 g; Refill: 3  2. Encounter for examination following treatment at hospital improving  3. Upper respiratory tract infection, unspecified type Complete augmentin and prednisone  4. Needs flu shot given    Return in about 4 months (around 06/01/2023) for PCP for chronic conditions-[place].  The patient was given clear instructions to go to ER or return to medical center if symptoms don't improve, worsen or new problems develop. The patient verbalized understanding. The patient was told to call to get lab results if they haven't heard anything in the next week.      Georgian Co, PA-C [facility] [place], [place] [phone]   02/01/2023, 9:13 AM

## 2023-02-04 LAB — CULTURE, BLOOD (ROUTINE X 2)
Culture: NO GROWTH
Culture: NO GROWTH

## 2023-02-09 ENCOUNTER — Other Ambulatory Visit: Payer: Self-pay | Admitting: Student

## 2023-02-09 ENCOUNTER — Other Ambulatory Visit: Payer: Self-pay | Admitting: Physician Assistant

## 2023-02-09 ENCOUNTER — Other Ambulatory Visit: Payer: Self-pay

## 2023-02-09 ENCOUNTER — Other Ambulatory Visit: Payer: Self-pay | Admitting: Pharmacist

## 2023-02-09 DIAGNOSIS — J432 Centrilobular emphysema: Secondary | ICD-10-CM

## 2023-02-09 DIAGNOSIS — R630 Anorexia: Secondary | ICD-10-CM

## 2023-02-09 DIAGNOSIS — G4709 Other insomnia: Secondary | ICD-10-CM

## 2023-02-09 MED ORDER — ALBUTEROL SULFATE HFA 108 (90 BASE) MCG/ACT IN AERS
2.0000 | INHALATION_SPRAY | RESPIRATORY_TRACT | 3 refills | Status: DC | PRN
  Filled 2023-02-09: qty 6.7, 17d supply, fill #0
  Filled 2023-04-03: qty 6.7, 17d supply, fill #1

## 2023-02-09 MED ORDER — ALBUTEROL SULFATE HFA 108 (90 BASE) MCG/ACT IN AERS
2.0000 | INHALATION_SPRAY | RESPIRATORY_TRACT | 3 refills | Status: DC | PRN
  Filled 2023-02-09: qty 6.7, 17d supply, fill #0

## 2023-02-13 ENCOUNTER — Other Ambulatory Visit: Payer: Self-pay

## 2023-02-15 IMAGING — DX DG CHEST 1V PORT
1 series · 1 of 1 positions shown · non-contrast
Comparison: Prior chest radiographs 12/21/2020 and earlier.

CLINICAL DATA: Shortness of breath.

EXAM:
PORTABLE CHEST 1 VIEW

[chest]
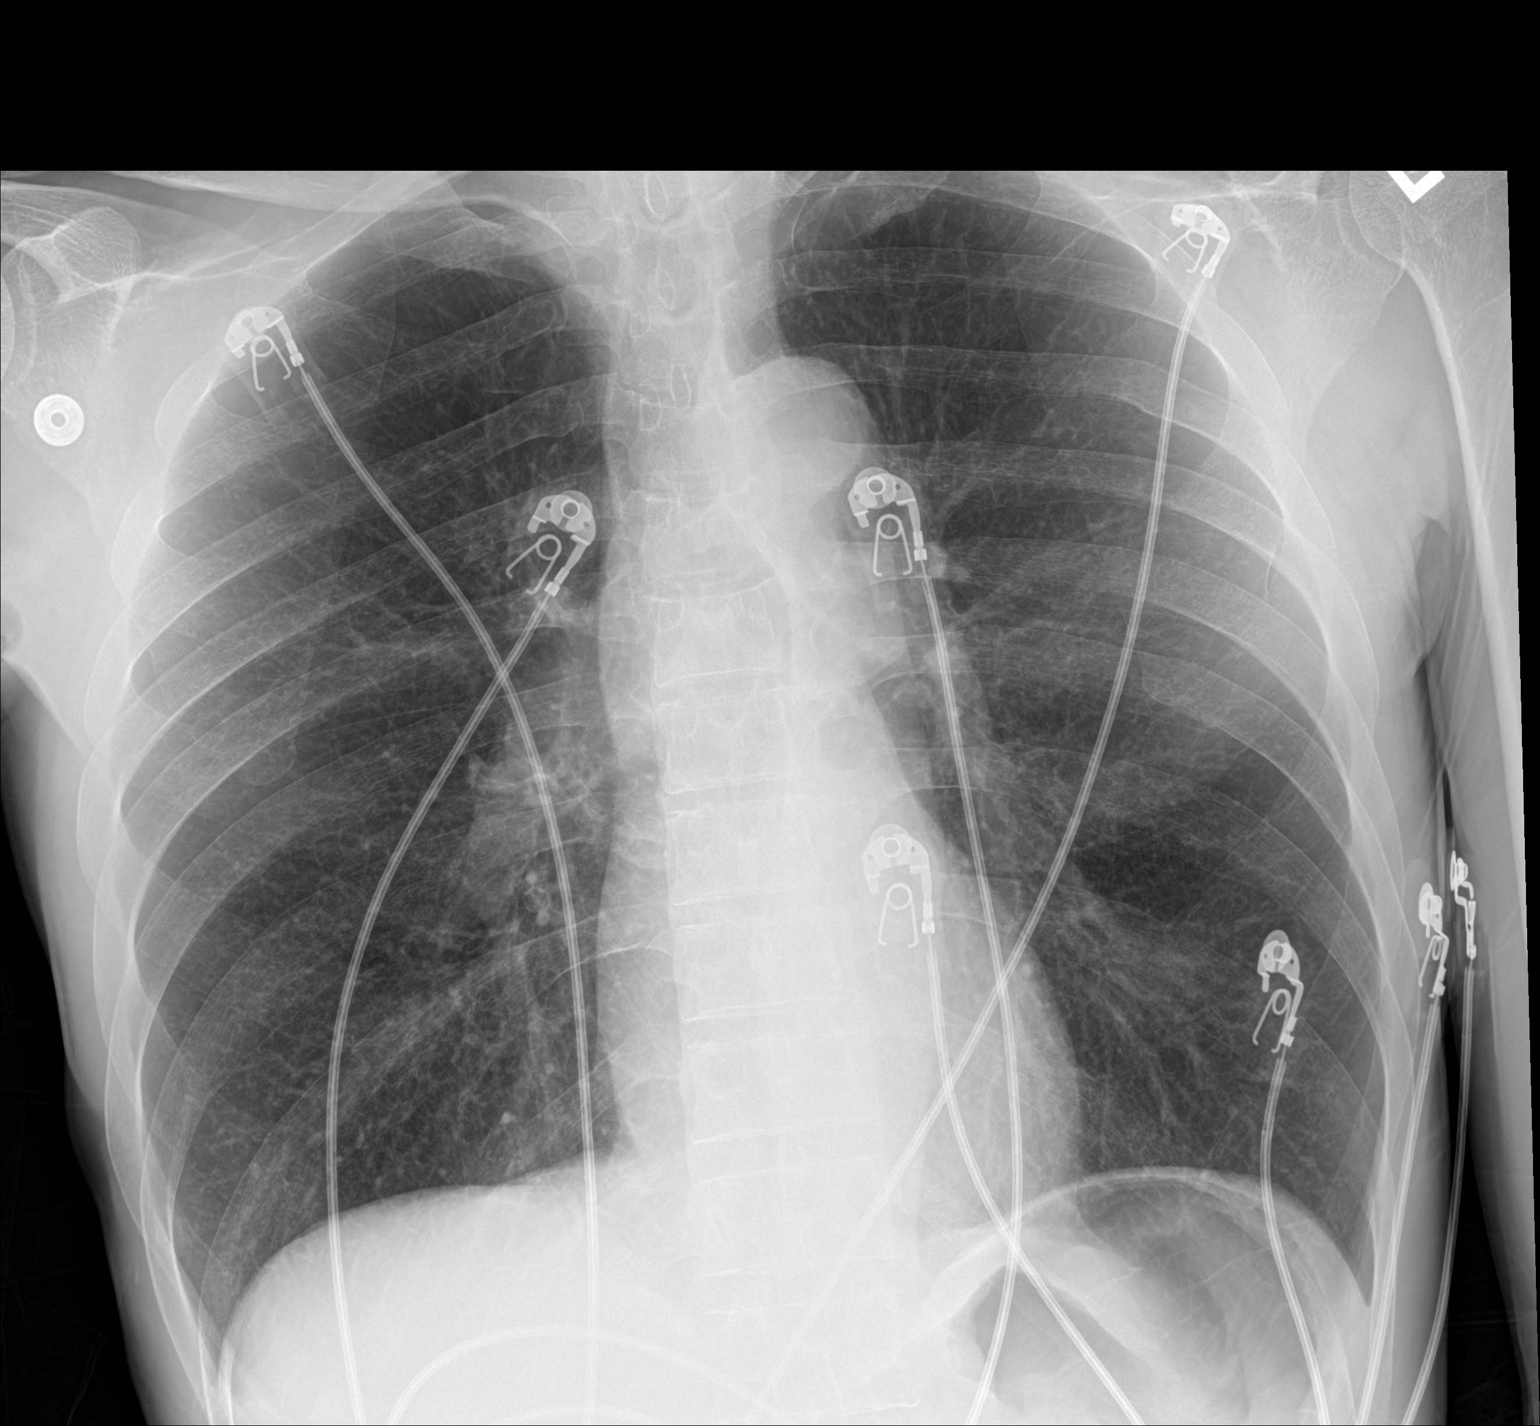

[1 of 1 positions shown; findings below may reference images not displayed]

FINDINGS: Heart size within normal limits. No appreciable airspace
consolidation or pulmonary edema. No evidence of pleural effusion or
pneumothorax. No acute bony abnormality identified.
IMPRESSION: No evidence of active cardiopulmonary disease.

## 2023-02-17 ENCOUNTER — Observation Stay (HOSPITAL_COMMUNITY)
Admission: EM | Admit: 2023-02-17 | Discharge: 2023-02-18 | Disposition: A | Discharge status: Home / Self Care | Payer: Medicare Other | Attending: Family Medicine | Admitting: Family Medicine

## 2023-02-17 ENCOUNTER — Other Ambulatory Visit: Payer: Self-pay

## 2023-02-17 ENCOUNTER — Encounter (HOSPITAL_COMMUNITY): Payer: Self-pay

## 2023-02-17 ENCOUNTER — Emergency Department (HOSPITAL_COMMUNITY): Payer: Medicare Other

## 2023-02-17 DIAGNOSIS — N179 Acute kidney failure, unspecified: Secondary | ICD-10-CM | POA: Diagnosis not present

## 2023-02-17 DIAGNOSIS — Z720 Tobacco use: Secondary | ICD-10-CM

## 2023-02-17 DIAGNOSIS — E785 Hyperlipidemia, unspecified: Secondary | ICD-10-CM | POA: Diagnosis not present

## 2023-02-17 DIAGNOSIS — J441 Chronic obstructive pulmonary disease with (acute) exacerbation: Secondary | ICD-10-CM | POA: Diagnosis not present

## 2023-02-17 DIAGNOSIS — J9601 Acute respiratory failure with hypoxia: Secondary | ICD-10-CM | POA: Diagnosis not present

## 2023-02-17 DIAGNOSIS — F17200 Nicotine dependence, unspecified, uncomplicated: Secondary | ICD-10-CM

## 2023-02-17 DIAGNOSIS — Z79899 Other long term (current) drug therapy: Secondary | ICD-10-CM | POA: Diagnosis not present

## 2023-02-17 DIAGNOSIS — I10 Essential (primary) hypertension: Secondary | ICD-10-CM | POA: Diagnosis not present

## 2023-02-17 DIAGNOSIS — R0602 Shortness of breath: Secondary | ICD-10-CM | POA: Diagnosis not present

## 2023-02-17 HISTORY — DX: Chronic obstructive pulmonary disease, unspecified: J44.9

## 2023-02-17 LAB — CBC
HCT: 43.1 % (ref 39.0–52.0)
Hemoglobin: 13.9 g/dL (ref 13.0–17.0)
MCH: 31.8 pg (ref 26.0–34.0)
MCHC: 32.3 g/dL (ref 30.0–36.0)
MCV: 98.6 fL (ref 80.0–100.0)
Platelets: 253 10*3/uL (ref 150–400)
RBC: 4.37 MIL/uL (ref 4.22–5.81)
RDW: 15.4 % (ref 11.5–15.5)
WBC: 5.5 10*3/uL (ref 4.0–10.5)
nRBC: 0 % (ref 0.0–0.2)

## 2023-02-17 LAB — BASIC METABOLIC PANEL
Anion gap: 10 (ref 5–15)
BUN: 16 mg/dL (ref 8–23)
CO2: 25 mmol/L (ref 22–32)
Calcium: 9.8 mg/dL (ref 8.9–10.3)
Chloride: 110 mmol/L (ref 98–111)
Creatinine, Ser: 1.25 mg/dL — ABNORMAL HIGH (ref 0.61–1.24)
GFR, Estimated: >60 mL/min (ref >60)
Glucose, Bld: 82 mg/dL (ref 70–99)
Potassium: 4.5 mmol/L (ref 3.5–5.1)
Sodium: 145 mmol/L (ref 135–145)

## 2023-02-17 LAB — BRAIN NATRIURETIC PEPTIDE: B Natriuretic Peptide: 69.4 pg/mL (ref 0.0–100.0)

## 2023-02-17 LAB — TROPONIN I (HIGH SENSITIVITY)
Troponin I (High Sensitivity): 5 ng/L (ref <18)
Troponin I (High Sensitivity): 6 ng/L (ref <18)

## 2023-02-17 MED ORDER — METHYLPREDNISOLONE SODIUM SUCC 125 MG IJ SOLR
125.0000 mg | Freq: Once | INTRAMUSCULAR | Status: AC
  Administered 2023-02-17: 125 mg via INTRAVENOUS
  Filled 2023-02-17: qty 2

## 2023-02-17 MED ORDER — FOLIC ACID 1 MG PO TABS
1.0000 mg | ORAL_TABLET | Freq: Every day | ORAL | Status: DC
  Administered 2023-02-17 – 2023-02-18 (×2): 1 mg via ORAL
  Filled 2023-02-17 (×2): qty 1

## 2023-02-17 MED ORDER — MAGNESIUM SULFATE 2 GM/50ML IV SOLN
2.0000 g | Freq: Once | INTRAVENOUS | Status: AC
  Administered 2023-02-17: 2 g via INTRAVENOUS
  Filled 2023-02-17: qty 50

## 2023-02-17 MED ORDER — LORAZEPAM 2 MG/ML IJ SOLN: 1.0000 mg | INTRAMUSCULAR | Status: DC | PRN

## 2023-02-17 MED ORDER — UMECLIDINIUM-VILANTEROL 62.5-25 MCG/ACT IN AEPB: 1.0000 | INHALATION_SPRAY | Freq: Every day | RESPIRATORY_TRACT | Status: DC

## 2023-02-17 MED ORDER — IPRATROPIUM-ALBUTEROL 0.5-2.5 (3) MG/3ML IN SOLN
9.0000 mL | Freq: Once | RESPIRATORY_TRACT | Status: AC
  Administered 2023-02-17: 9 mL via RESPIRATORY_TRACT
  Filled 2023-02-17: qty 3

## 2023-02-17 MED ORDER — IPRATROPIUM-ALBUTEROL 0.5-2.5 (3) MG/3ML IN SOLN
RESPIRATORY_TRACT | Status: AC
  Filled 2023-02-17: qty 9

## 2023-02-17 MED ORDER — GUAIFENESIN 100 MG/5ML PO LIQD
5.0000 mL | ORAL | Status: DC | PRN
  Administered 2023-02-18: 5 mL via ORAL
  Filled 2023-02-17: qty 15

## 2023-02-17 MED ORDER — ADULT MULTIVITAMIN W/MINERALS CH
1.0000 | ORAL_TABLET | Freq: Every day | ORAL | Status: DC
  Administered 2023-02-17 – 2023-02-18 (×2): 1 via ORAL
  Filled 2023-02-17 (×2): qty 1

## 2023-02-17 MED ORDER — UMECLIDINIUM BROMIDE 62.5 MCG/ACT IN AEPB
1.0000 | INHALATION_SPRAY | Freq: Every day | RESPIRATORY_TRACT | Status: DC
  Administered 2023-02-18: 1 via RESPIRATORY_TRACT
  Filled 2023-02-17: qty 7

## 2023-02-17 MED ORDER — THIAMINE MONONITRATE 100 MG PO TABS
100.0000 mg | ORAL_TABLET | Freq: Every day | ORAL | Status: DC
  Administered 2023-02-17 – 2023-02-18 (×2): 100 mg via ORAL
  Filled 2023-02-17 (×2): qty 1

## 2023-02-17 MED ORDER — THIAMINE HCL 100 MG/ML IJ SOLN: 100.0000 mg | Freq: Every day | INTRAMUSCULAR | Status: DC

## 2023-02-17 MED ORDER — ATORVASTATIN CALCIUM 10 MG PO TABS
10.0000 mg | ORAL_TABLET | Freq: Every day | ORAL | Status: DC
  Administered 2023-02-18: 10 mg via ORAL
  Filled 2023-02-17: qty 1

## 2023-02-17 MED ORDER — ACETAMINOPHEN 325 MG PO TABS: 650.0000 mg | ORAL_TABLET | Freq: Four times a day (QID) | ORAL | Status: DC | PRN

## 2023-02-17 MED ORDER — ENOXAPARIN SODIUM 40 MG/0.4ML IJ SOSY
40.0000 mg | PREFILLED_SYRINGE | INTRAMUSCULAR | Status: DC
  Administered 2023-02-17: 40 mg via SUBCUTANEOUS
  Filled 2023-02-17: qty 0.4

## 2023-02-17 MED ORDER — FLUTICASONE FUROATE-VILANTEROL 200-25 MCG/ACT IN AEPB: 1.0000 | INHALATION_SPRAY | Freq: Every day | RESPIRATORY_TRACT | Status: DC

## 2023-02-17 MED ORDER — AMLODIPINE BESYLATE 5 MG PO TABS
5.0000 mg | ORAL_TABLET | Freq: Every day | ORAL | Status: DC
  Administered 2023-02-18: 5 mg via ORAL
  Filled 2023-02-17: qty 1

## 2023-02-17 MED ORDER — FLUTICASONE FUROATE-VILANTEROL 200-25 MCG/ACT IN AEPB
1.0000 | INHALATION_SPRAY | Freq: Every day | RESPIRATORY_TRACT | Status: DC
  Administered 2023-02-18: 1 via RESPIRATORY_TRACT
  Filled 2023-02-17: qty 28

## 2023-02-17 MED ORDER — METHYLPREDNISOLONE SODIUM SUCC 40 MG IJ SOLR
40.0000 mg | Freq: Two times a day (BID) | INTRAMUSCULAR | Status: DC
  Administered 2023-02-18: 40 mg via INTRAVENOUS
  Filled 2023-02-17: qty 1

## 2023-02-17 MED ORDER — PREDNISONE 10 MG PO TABS: 40.0000 mg | ORAL_TABLET | Freq: Every day | ORAL | Status: DC

## 2023-02-17 MED ORDER — ACETAMINOPHEN 650 MG RE SUPP: 650.0000 mg | Freq: Four times a day (QID) | RECTAL | Status: DC | PRN

## 2023-02-17 MED ORDER — UMECLIDINIUM BROMIDE 62.5 MCG/ACT IN AEPB: 1.0000 | INHALATION_SPRAY | Freq: Every day | RESPIRATORY_TRACT | Status: DC

## 2023-02-17 MED ORDER — LORAZEPAM 1 MG PO TABS: 1.0000 mg | ORAL_TABLET | ORAL | Status: DC | PRN

## 2023-02-17 MED ORDER — ALBUTEROL SULFATE (2.5 MG/3ML) 0.083% IN NEBU
2.5000 mg | INHALATION_SOLUTION | RESPIRATORY_TRACT | Status: DC | PRN
  Administered 2023-02-18: 2.5 mg via RESPIRATORY_TRACT
  Filled 2023-02-17: qty 3

## 2023-02-17 NOTE — ED Notes (Signed)
ED TO INPATIENT HANDOFF REPORT  ED Nurse Name and Phone #: Durene Cal RN   S Name/Age/Gender Jason Wong 67 y.o. male Room/Bed: RESUSC/RESUSC  Code Status   Code Status: Full Code  Home/SNF/Other Home Patient oriented to: self, place, time, and situation Is this baseline? Yes   Triage Complete: Triage complete  Chief Complaint Acute exacerbation of chronic obstructive pulmonary disease (COPD) (HCC) [J44.1]  Triage Note Pt c.o sob on exertion with a dry cough. Pt also c.o some chest pain   Allergies No Known Allergies  Level of Care/Admitting Diagnosis ED Disposition     ED Disposition  Admit   Condition  --   Comment  Hospital Area: [facility] [[id number]]  Level of Care: Med-Surg [16]  May admit patient to [facility] or [facility] if equivalent level of care is available:: Yes  Covid Evaluation: Confirmed COVID Negative  Diagnosis: Acute exacerbation of chronic obstructive pulmonary disease (COPD) (HCC) [132440]  Admitting Physician: Alvester Chou  Attending Physician: Gery Pray [[id number]]  Certification:: I certify this patient will need inpatient services for at least 2 midnights  Expected Medical Readiness: 02/19/2023          B Medical/Surgery History Past Medical History:  Diagnosis Date   COPD (chronic obstructive pulmonary disease) (HCC)    History reviewed. No pertinent surgical history.   A IV Location/Drains/Wounds Patient Lines/Drains/Airways Status     Active Line/Drains/Airways     Name Placement date Placement time Site Days   Peripheral IV 02/17/23 20 G Left;Posterior Wrist 02/17/23  --  Wrist  less than 1            Intake/Output Last 24 hours No intake or output data in the 24 hours ending 02/17/23 2205  Labs/Imaging Results for orders placed or performed during the hospital encounter of 02/17/23 (from the past 48 hour(s))  Basic metabolic panel     Status: Abnormal   Collection Time: 02/17/23 11:49  AM  Result Value Ref Range   Sodium 145 135 - 145 mmol/L   Potassium 4.5 3.5 - 5.1 mmol/L   Chloride 110 98 - 111 mmol/L   CO2 25 22 - 32 mmol/L   Glucose, Bld 82 70 - 99 mg/dL    Comment: Glucose reference range applies only to samples taken after fasting for at least 8 hours.   BUN 16 8 - 23 mg/dL   Creatinine, Ser 1.02 (H) 0.61 - 1.24 mg/dL   Calcium 9.8 8.9 - 72.5 mg/dL   GFR, Estimated >36 >64 mL/min    Comment: (NOTE) Calculated using the CKD-EPI Creatinine Equation (2021)    Anion gap 10 5 - 15    Comment: Performed at [facility], [address], [place], [place] [address]  CBC     Status: None   Collection Time: 02/17/23 11:49 AM  Result Value Ref Range   WBC 5.5 4.0 - 10.5 K/uL   RBC 4.37 4.22 - 5.81 MIL/uL   Hemoglobin 13.9 13.0 - 17.0 g/dL   HCT 42.5 95.6 - 38.7 %   MCV 98.6 80.0 - 100.0 fL   MCH 31.8 26.0 - 34.0 pg   MCHC 32.3 30.0 - 36.0 g/dL   RDW 56.4 33.2 - 95.1 %   Platelets 253 150 - 400 K/uL   nRBC 0.0 0.0 - 0.2 %    Comment: Performed at [facility], [address] [place], [place] [address]  Troponin I (High Sensitivity)  Status: None   Collection Time: 02/17/23 11:49 AM  Result Value Ref Range   Troponin I (High Sensitivity) 5 <18 ng/L    Comment: (NOTE) Elevated high sensitivity troponin I (hsTnI) values and significant  changes across serial measurements may suggest ACS but many other  chronic and acute conditions are known to elevate hsTnI results.  Refer to the "Links" section for chest pain algorithms and additional  guidance. Performed at [facility], [address], [place], [place] [address]   Troponin I (High Sensitivity)     Status: None   Collection Time: 02/17/23  2:47 PM  Result Value Ref Range   Troponin I (High Sensitivity) 6 <18 ng/L    Comment: (NOTE) Elevated high sensitivity troponin I (hsTnI) values and significant  changes across serial measurements may suggest ACS but many other  chronic and acute  conditions are known to elevate hsTnI results.  Refer to the "Links" section for chest pain algorithms and additional  guidance. Performed at [facility], [address], [place], [place] [address]   Brain natriuretic peptide     Status: None   Collection Time: 02/17/23  4:00 PM  Result Value Ref Range   B Natriuretic Peptide 69.4 0.0 - 100.0 pg/mL    Comment: Performed at [facility], [address], [place], [place] [address]   DG Chest 2 View  Result Date: 02/17/2023 CLINICAL DATA:  Shortness of breath EXAM: CHEST - 2 VIEW COMPARISON:  01/30/2023 FINDINGS: The heart size and mediastinal contours are within normal limits. Both lungs are clear. The visualized skeletal structures are unremarkable. IMPRESSION: No active cardiopulmonary disease. Electronically Signed   By: Duanne Guess D.O.   On: 02/17/2023 12:26    Pending Labs Unresulted Labs (From admission, onward)     Start     Ordered   02/24/23 0500  Creatinine, serum  (enoxaparin (LOVENOX)    CrCl >/= 30 ml/min)  Weekly,   R     Comments: while on enoxaparin therapy    02/17/23 2126   02/18/23 0500  CBC with Differential  Tomorrow morning,   R        02/17/23 2125   02/18/23 0500  Basic metabolic panel  Tomorrow morning,   R        02/17/23 2125   02/17/23 2127  Magnesium  Add-on,   AD        02/17/23 2126   02/17/23 2127  Phosphorus  Add-on,   AD        02/17/23 2126   02/17/23 2126  CBC  (enoxaparin (LOVENOX)    CrCl >/= 30 ml/min)  Once,   R       Comments: Baseline for enoxaparin therapy IF NOT ALREADY DRAWN.  Notify MD if PLT < 100 K.    02/17/23 2126   02/17/23 2126  Creatinine, serum  (enoxaparin (LOVENOX)    CrCl >/= 30 ml/min)  Once,   R       Comments: Baseline for enoxaparin therapy IF NOT ALREADY DRAWN.    02/17/23 2126   02/17/23 2125  Expectorated Sputum Assessment w Gram Stain, Rflx to Resp Cult  Once,   R        02/17/23 2125            Vitals/Pain Today's Vitals   02/17/23  1144 02/17/23 1549 02/17/23 2015 02/17/23 2030  BP:  (!) 148/109 (!) 119/90 (!) 160/80  Pulse:  87 72  83  Resp:  (!) 22 12 13   Temp:      SpO2:  99% 94% 97%  Weight: 54.4 kg     Height: 5\' 7"  (1.702 m)     PainSc: 5        Isolation Precautions No active isolations  Medications Medications  amLODipine (NORVASC) tablet 5 mg (has no administration in time range)  atorvastatin (LIPITOR) tablet 10 mg (has no administration in time range)  fluticasone furoate-vilanterol (BREO ELLIPTA) 200-25 MCG/ACT 1 puff (has no administration in time range)  methylPREDNISolone sodium succinate (SOLU-MEDROL) 40 mg/mL injection 40 mg (has no administration in time range)    Followed by  predniSONE (DELTASONE) tablet 40 mg (has no administration in time range)  albuterol (PROVENTIL) (2.5 MG/3ML) 0.083% nebulizer solution 2.5 mg (has no administration in time range)  umeclidinium-vilanterol (ANORO ELLIPTA) 62.5-25 MCG/ACT 1 puff (has no administration in time range)  enoxaparin (LOVENOX) injection 40 mg (has no administration in time range)  acetaminophen (TYLENOL) tablet 650 mg (has no administration in time range)    Or  acetaminophen (TYLENOL) suppository 650 mg (has no administration in time range)  guaiFENesin (ROBITUSSIN) 100 MG/5ML liquid 5 mL (has no administration in time range)  LORazepam (ATIVAN) tablet 1-4 mg (has no administration in time range)    Or  LORazepam (ATIVAN) injection 1-4 mg (has no administration in time range)  thiamine (VITAMIN B1) tablet 100 mg (has no administration in time range)    Or  thiamine (VITAMIN B1) injection 100 mg (has no administration in time range)  folic acid (FOLVITE) tablet 1 mg (has no administration in time range)  multivitamin with minerals tablet 1 tablet (has no administration in time range)  ipratropium-albuterol (DUONEB) 0.5-2.5 (3) MG/3ML nebulizer solution 9 mL (9 mLs Nebulization Given 02/17/23 1848)  methylPREDNISolone sodium succinate  (SOLU-MEDROL) 125 mg/2 mL injection 125 mg (125 mg Intravenous Given 02/17/23 1823)  magnesium sulfate IVPB 2 g 50 mL (0 g Intravenous Stopped 02/17/23 1928)    Mobility walks with person assist     Focused Assessments     R Recommendations: See Admitting Provider Note  Report given to:   Additional Notes:

## 2023-02-17 NOTE — ED Provider Notes (Signed)
[place] EMERGENCY DEPARTMENT AT [facility] Provider Note   CSN: [id number] Arrival date & time: 02/17/23  1114     History Chief Complaint  Patient presents with   Shortness of Breath    HPI Jason Wong is a 67 y.o. male presenting for SOB. Wheezing and hx of COPD.   Patient's recorded medical, surgical, social, medication list and allergies were reviewed in the Snapshot window as part of the initial history.   Review of Systems   Review of Systems  Constitutional:  Negative for chills and fever.  HENT:  Negative for ear pain and sore throat.   Eyes:  Negative for pain and visual disturbance.  Respiratory:  Positive for shortness of breath and wheezing. Negative for cough.   Cardiovascular:  Negative for chest pain and palpitations.  Gastrointestinal:  Negative for abdominal pain and vomiting.  Genitourinary:  Negative for dysuria and hematuria.  Musculoskeletal:  Negative for arthralgias and back pain.  Skin:  Negative for color change and rash.  Neurological:  Negative for seizures and syncope.  All other systems reviewed and are negative.   Physical Exam Updated Vital Signs BP (!) 160/80   Pulse 83   Temp 97.7 F (36.5 C)   Resp 13   Ht 5\' 7"  (1.702 m)   Wt 54.4 kg   SpO2 97%   BMI 18.79 kg/m  Physical Exam Vitals and nursing note reviewed.  Constitutional:      General: He is not in acute distress.    Appearance: He is well-developed.  HENT:     Head: Normocephalic and atraumatic.  Eyes:     Conjunctiva/sclera: Conjunctivae normal.  Cardiovascular:     Rate and Rhythm: Normal rate and regular rhythm.     Heart sounds: No murmur heard. Pulmonary:     Effort: Pulmonary effort is normal. No respiratory distress.     Breath sounds: Wheezing and rhonchi present.  Abdominal:     Palpations: Abdomen is soft.     Tenderness: There is no abdominal tenderness.  Musculoskeletal:        General: No swelling.     Cervical back: Neck supple.   Skin:    General: Skin is warm and dry.     Capillary Refill: Capillary refill takes less than 2 seconds.  Neurological:     Mental Status: He is alert.  Psychiatric:        Mood and Affect: Mood normal.      ED Course/ Medical Decision Making/ A&P    Procedures .Critical Care  Performed by: Glyn Ade, MD Authorized by: Glyn Ade, MD   Critical care provider statement:    Critical care time (minutes):  30   Critical care was necessary to treat or prevent imminent or life-threatening deterioration of the following conditions:  Respiratory failure   Critical care was time spent personally by me on the following activities:  Development of treatment plan with patient or surrogate, discussions with consultants, evaluation of patient's response to treatment, examination of patient, ordering and review of laboratory studies, ordering and review of radiographic studies, ordering and performing treatments and interventions, pulse oximetry, re-evaluation of patient's condition and review of old charts   Care discussed with: admitting provider      Medications Ordered in ED Medications  amLODipine (NORVASC) tablet 5 mg (has no administration in time range)  atorvastatin (LIPITOR) tablet 10 mg (has no administration in time range)  fluticasone furoate-vilanterol (BREO ELLIPTA) 200-25 MCG/ACT 1 puff (has  no administration in time range)  methylPREDNISolone sodium succinate (SOLU-MEDROL) 40 mg/mL injection 40 mg (has no administration in time range)    Followed by  predniSONE (DELTASONE) tablet 40 mg (has no administration in time range)  albuterol (PROVENTIL) (2.5 MG/3ML) 0.083% nebulizer solution 2.5 mg (has no administration in time range)  umeclidinium-vilanterol (ANORO ELLIPTA) 62.5-25 MCG/ACT 1 puff (has no administration in time range)  enoxaparin (LOVENOX) injection 40 mg (has no administration in time range)  acetaminophen (TYLENOL) tablet 650 mg (has no  administration in time range)    Or  acetaminophen (TYLENOL) suppository 650 mg (has no administration in time range)  guaiFENesin (ROBITUSSIN) 100 MG/5ML liquid 5 mL (has no administration in time range)  LORazepam (ATIVAN) tablet 1-4 mg (has no administration in time range)    Or  LORazepam (ATIVAN) injection 1-4 mg (has no administration in time range)  thiamine (VITAMIN B1) tablet 100 mg (has no administration in time range)    Or  thiamine (VITAMIN B1) injection 100 mg (has no administration in time range)  folic acid (FOLVITE) tablet 1 mg (has no administration in time range)  multivitamin with minerals tablet 1 tablet (has no administration in time range)  ipratropium-albuterol (DUONEB) 0.5-2.5 (3) MG/3ML nebulizer solution 9 mL (9 mLs Nebulization Given 02/17/23 1848)  methylPREDNISolone sodium succinate (SOLU-MEDROL) 125 mg/2 mL injection 125 mg (125 mg Intravenous Given 02/17/23 1823)  magnesium sulfate IVPB 2 g 50 mL (0 g Intravenous Stopped 02/17/23 1928)   Medical Decision Making:   Jason Wong is a 67 y.o. male with a history of COPD, who presented to the ED today with acute on chronic SOB. They are endorsing worsening of their baseline dyspnea over the past 72 hours. Their baseline is a 0L O2 requirement. At their baseline they are able to get around the neighborhood and they are not able to at this time.   On my initial exam, the pt was SOB and tachypneic. Audible wheezing and grossly decreased breath sounds appreciated.  They are endorsing increased sputum production.    Reviewed and confirmed nursing documentation for past medical history, family history, social history.    Initial Assessment:   With the patient's presentation of SOB in the above setting, most likely diagnosis is COPD Exacerbation. Other diagnoses were considered including (but not limited to) CAP, PE, ACS, viral infection, PTX. These are considered less likely due to history of present illness and physical  exam findings.   This is most consistent with an acute life/limb threatening illness complicated by underlying chronic conditions.  Initial Plan:  Empiric treatment of patient's symptoms with immediate initiation of inhaled bronchodilators and IV steroids. Given advanced nature of patient's presentation, will proceed with IV magnesium as a rescue therapy.   Evaluation for ACS with EKG and delta troponin  Evaluation for infectious versus intrathoracic abnormality with chest x-ray  Evaluation for volume overload with BNP  Screening labs including CBC and Metabolic panel to evaluate for infectious or metabolic etiology of disease.  Patient's Wells score is low and patient does not warrant further objective evaluation for PE based on consistency of presentation of alternative diagnosis.  Objective evaluation as below reviewed   Initial Study Results:   Laboratory  All laboratory results reviewed without evidence of clinically relevant pathology.    EKG EKG was reviewed independently. Rate, rhythm, axis, intervals all examined and without medically relevant abnormality. ST segments without concerns for elevations.    Radiology:  All images reviewed independently. Agree with  radiology report at this time.   DG Chest 2 View  Result Date: 02/17/2023 CLINICAL DATA:  Shortness of breath EXAM: CHEST - 2 VIEW COMPARISON:  01/30/2023 FINDINGS: The heart size and mediastinal contours are within normal limits. Both lungs are clear. The visualized skeletal structures are unremarkable. IMPRESSION: No active cardiopulmonary disease. Electronically Signed   By: Duanne Guess D.O.   On: 02/17/2023 12:26   VAS [place] ABI WITH/WO TBI  Result Date: 01/31/2023  LOWER EXTREMITY DOPPLER STUDY Patient Name:  FAITH BETSILL  Date of Exam:   01/31/2023 Medical Rec #: [id number]     Accession #:    [id number] Date of Birth: 1955/09/28     Patient Gender: M Patient Age:   59 years Exam Location:  [facility]  Procedure:      VAS [place] ABI WITH/WO TBI Referring Phys: NISCHAL NARENDRA --------------------------------------------------------------------------------  Indications: Rest pain. High Risk Factors: Hypertension, hyperlipidemia, past history of smoking.  Comparison Study: No prior study Performing Technologist: Shona Simpson  Examination Guidelines: A complete evaluation includes at minimum, Doppler waveform signals and systolic blood pressure reading at the level of bilateral brachial, anterior tibial, and posterior tibial arteries, when vessel segments are accessible. Bilateral testing is considered an integral part of a complete examination. Photoelectric Plethysmograph (PPG) waveforms and toe systolic pressure readings are included as required and additional duplex testing as needed. Limited examinations for reoccurring indications may be performed as noted.  ABI Findings: +--------+------------------+-----+---------+--------+ Right   Rt Pressure (mmHg)IndexWaveform Comment  +--------+------------------+-----+---------+--------+ [id number]                    triphasic         +--------+------------------+-----+---------+--------+ PTA     141               1.22 triphasic         +--------+------------------+-----+---------+--------+ DP      133               1.15 triphasic         +--------+------------------+-----+---------+--------+ +--------+------------------+-----+-----------+-------+ Left    Lt Pressure (mmHg)IndexWaveform   Comment +--------+------------------+-----+-----------+-------+ [id number]                    triphasic          +--------+------------------+-----+-----------+-------+ PTA     130               1.12 triphasic          +--------+------------------+-----+-----------+-------+ DP      129               1.11 multiphasic        +--------+------------------+-----+-----------+-------+ +-------+-----------+-----------+------------+------------+  ABI/TBIToday's ABIToday's TBIPrevious ABIPrevious TBI +-------+-----------+-----------+------------+------------+ Right  1.22                                           +-------+-----------+-----------+------------+------------+ Left   1.12                                           +-------+-----------+-----------+------------+------------+  Summary: Right: Resting right ankle-brachial index is within normal range. Left: Resting left ankle-brachial index is within normal range. *See table(s) above for measurements and observations.  Electronically signed by Lemar Livings MD on 01/31/2023 at 7:00:58 PM.  Final    DG Chest Portable 1 View  Result Date: 01/30/2023 CLINICAL DATA:  67 year old male with respiratory distress. EXAM: PORTABLE CHEST 1 VIEW COMPARISON:  Chest radiographs 01/23/2023 and earlier. FINDINGS: Portable AP upright view at 0612 hours. Chronic hyperinflation and emphysema demonstrated on CTA this year. Mediastinal contours are stable, within normal limits. Mild but increased since October bilateral perihilar and basilar predominant reticular interstitial lung markings. No superimposed pneumothorax, pulmonary edema, pleural effusion or consolidation. Visualized tracheal air column is within normal limits. No acute osseous abnormality identified. Negative visible bowel gas. IMPRESSION: Emphysema ([id number]) with evidence of acute bilateral interstitial infectious exacerbation. No consolidation or pleural effusion. Electronically Signed   By: Odessa Fleming M.D.   On: 01/30/2023 06:42    Final Assessment and Plan:   After initiation of medical therapies, patient is grossly improved and no longer in acute distress.    Given the advanced nature of the patient's prenetation, patient will require advanced care and admission was arranged.     Clinical Impression:  1. SOB (shortness of breath)      Admit   Final Clinical Impression(s) / ED Diagnoses Final diagnoses:  SOB  (shortness of breath)    Rx / DC Orders ED Discharge Orders     None         Glyn Ade, MD 02/17/23 2302

## 2023-02-17 NOTE — H&P (Signed)
lPCP:   Pcp, No   Chief Complaint:  Shortness of breath, wheeze.  HPI: This is a 67 year old male with past medical history significant for HTN, HLD, COPD/asthma, alcohol use disorder and tobacco use disorder in remission.  Per patient last Sunday a friend visited the smoke cigarettes together.  After that he developed shortness of breath, dizziness on standing.  For the past 4 days he been wheezing.  He has been using nebulizers without improvement.  His wheezing gets worse as he is up and walking around.  He denies fevers or chills.  It became severe enough, 911 was called, he was brought to the ER.  In the ER patient dyspneic, tachypneic and wheezing.  He was treated with IV magnesium, steroids and nebulizers with some improvement however, felt wheezing persists.  Creatinine 1.34.  Baseline normal.  Overnight observation requested..  Review of Systems:  Per HPI.Marland Kitchen  Past Medical History: Past Medical History:  Diagnosis Date   COPD (chronic obstructive pulmonary disease) (HCC)    History reviewed. No pertinent surgical history.  Medications: Prior to Admission medications   Medication Sig Start Date End Date Taking? Authorizing Provider  albuterol (VENTOLIN HFA) 108 (90 Base) MCG/ACT inhaler Inhale 2 puffs into the lungs every 4 (four) hours as needed for wheezing or shortness of breath. Patient not taking: Reported on 01/30/2023    [provider]  amLODipine (NORVASC) 5 MG tablet Take 5 mg by mouth daily.    [provider]  atorvastatin (LIPITOR) 10 MG tablet Take 10 mg by mouth daily.    [provider]  Fluticasone-Umeclidin-Vilant (TRELEGY ELLIPTA) 200-62.5-25 MCG/ACT AEPB Inhale 1 puff into the lungs daily. Patient not taking: Reported on 01/30/2023    [provider]  ipratropium-albuterol (DUONEB) 0.5-2.5 (3) MG/3ML SOLN Take 3 mLs by nebulization every 4 (four) hours as needed (wheezing, shortness of breath). Patient not taking: Reported  on 01/30/2023    [provider]    Allergies:  No Known Allergies  Social History:  reports that he has never smoked. He has never used smokeless tobacco. No history on file for alcohol use and drug use.  Family History: History reviewed. No pertinent family history.  Physical Exam: Vitals:   02/17/23 1138 02/17/23 1144 02/17/23 1549 02/17/23 2015  BP: 119/85  (!) 148/109 (!) 119/90  Pulse: 84  87 72  Resp: 15  (!) 22 12  Temp: 97.7 F (36.5 C)     SpO2: 99%  99% 94%  Weight:  54.4 kg    Height:  5\' 7"  (1.702 m)      General: A and O x 3, well developed and nourished, no acute distress Eyes: Pink conjunctiva, no scleral icterus ENT: Moist oral mucosa, neck supple, no thyromegaly Lungs: Mild generalized wheezing, no crackles, no use of accessory muscles Cardiovascular: RRR, no regurgitation, no gallops, no murmurs. No carotid bruits, no JVD Abdomen: soft, positive BS, NTND, no organomegaly, not an acute abdomen GU: not examined Neuro: CN II - XII grossly intact, sensation intact Musculoskeletal: strength 5/5 all extremities, no clubbing, cyanosis or edema Skin: no rash, no subcutaneous crepitation, no decubitus Psych: appropriate patient  Labs on Admission:  Recent Labs    02/17/23 1149  NA 145  K 4.5  CL 110  CO2 25  GLUCOSE 82  BUN 16  CREATININE 1.25*  CALCIUM 9.8    Recent Labs    02/17/23 1149  WBC 5.5  HGB 13.9  HCT 43.1  MCV 98.6  PLT 253     Radiological Exams on Admission: DG Chest 2 View  Result Date: 02/17/2023 CLINICAL DATA:  Shortness of breath EXAM: CHEST - 2 VIEW COMPARISON:  01/30/2023 FINDINGS: The heart size and mediastinal contours are within normal limits. Both lungs are clear. The visualized skeletal structures are unremarkable. IMPRESSION: No active cardiopulmonary disease. Electronically Signed   By: Duanne Guess D.O.   On: 02/17/2023 12:26    Assessment/Plan Present on Admission:  Acute exacerbation COPD -COPD  order set initiated -IV Solu-Medrol, transition to p.o. -PRN Solu-Medrol -No antibiotics initiated -Flonase ordered -Robitussin as needed -Incruse Ellipta inhalation initiated.   Tobacco use -Declines nicotine patch  Alcohol use -Per patient he drinks 1 mini liquor bottle and 1 beer daily -CIWA protocol initiated.  MVI, vitamin B1, folic acid initiated.  First doses now   AKI -Gentle IV fluid hydration.   HTN -Norvasc resumed   HLD -Atorvastatin resumed   Gwendalyn Mcgonagle 02/17/2023, 8:34 PM

## 2023-02-17 NOTE — ED Triage Notes (Signed)
Pt c.o sob on exertion with a dry cough. Pt also c.o some chest pain

## 2023-02-18 ENCOUNTER — Other Ambulatory Visit: Payer: Self-pay

## 2023-02-18 DIAGNOSIS — J441 Chronic obstructive pulmonary disease with (acute) exacerbation: Secondary | ICD-10-CM | POA: Diagnosis not present

## 2023-02-18 LAB — CBC WITH DIFFERENTIAL/PLATELET
Abs Immature Granulocytes: 0.01 10*3/uL (ref 0.00–0.07)
Basophils Absolute: 0 10*3/uL (ref 0.0–0.1)
Basophils Relative: 0 %
Eosinophils Absolute: 0 10*3/uL (ref 0.0–0.5)
Eosinophils Relative: 0 %
HCT: 40.7 % (ref 39.0–52.0)
Hemoglobin: 14 g/dL (ref 13.0–17.0)
Immature Granulocytes: 0 %
Lymphocytes Relative: 11 %
Lymphs Abs: 0.6 10*3/uL — ABNORMAL LOW (ref 0.7–4.0)
MCH: 33.4 pg (ref 26.0–34.0)
MCHC: 34.4 g/dL (ref 30.0–36.0)
MCV: 97.1 fL (ref 80.0–100.0)
Monocytes Absolute: 0 10*3/uL — ABNORMAL LOW (ref 0.1–1.0)
Monocytes Relative: 1 %
Neutro Abs: 4.4 10*3/uL (ref 1.7–7.7)
Neutrophils Relative %: 88 %
Platelets: 249 10*3/uL (ref 150–400)
RBC: 4.19 MIL/uL — ABNORMAL LOW (ref 4.22–5.81)
RDW: 15.3 % (ref 11.5–15.5)
WBC: 5 10*3/uL (ref 4.0–10.5)
nRBC: 0 % (ref 0.0–0.2)

## 2023-02-18 LAB — BASIC METABOLIC PANEL
Anion gap: 10 (ref 5–15)
Anion gap: 10 (ref 5–15)
BUN: 15 mg/dL (ref 8–23)
BUN: 16 mg/dL (ref 8–23)
CO2: 21 mmol/L — ABNORMAL LOW (ref 22–32)
CO2: 21 mmol/L — ABNORMAL LOW (ref 22–32)
Calcium: 9.4 mg/dL (ref 8.9–10.3)
Calcium: 9.7 mg/dL (ref 8.9–10.3)
Chloride: 108 mmol/L (ref 98–111)
Chloride: 109 mmol/L (ref 98–111)
Creatinine, Ser: 1.08 mg/dL (ref 0.61–1.24)
Creatinine, Ser: 1.34 mg/dL — ABNORMAL HIGH (ref 0.61–1.24)
GFR, Estimated: 58 mL/min — ABNORMAL LOW (ref >60)
GFR, Estimated: >60 mL/min (ref >60)
Glucose, Bld: 120 mg/dL — ABNORMAL HIGH (ref 70–99)
Glucose, Bld: 177 mg/dL — ABNORMAL HIGH (ref 70–99)
Potassium: 4 mmol/L (ref 3.5–5.1)
Potassium: 4 mmol/L (ref 3.5–5.1)
Sodium: 139 mmol/L (ref 135–145)
Sodium: 140 mmol/L (ref 135–145)

## 2023-02-18 MED ORDER — ALBUTEROL SULFATE HFA 108 (90 BASE) MCG/ACT IN AERS: 2.0000 | INHALATION_SPRAY | RESPIRATORY_TRACT | 2 refills | Status: DC | PRN

## 2023-02-18 MED ORDER — PREDNISONE 20 MG PO TABS
ORAL_TABLET | ORAL | 0 refills | Status: AC
  Filled 2023-02-18: qty 7, 5d supply, fill #0

## 2023-02-18 MED ORDER — FLUTICASONE FUROATE-VILANTEROL 200-25 MCG/ACT IN AEPB
1.0000 | INHALATION_SPRAY | Freq: Every day | RESPIRATORY_TRACT | 0 refills | Status: DC
  Filled 2023-02-18: qty 60, 30d supply, fill #0

## 2023-02-18 MED ORDER — ALBUTEROL SULFATE (2.5 MG/3ML) 0.083% IN NEBU
2.5000 mg | INHALATION_SOLUTION | RESPIRATORY_TRACT | 12 refills | Status: DC | PRN
  Filled 2023-02-18: qty 75, 5d supply, fill #0

## 2023-02-18 NOTE — Progress Notes (Addendum)
Ambulated to bathroom, O2 remained >95% on RA however patient very dyspneic, does state that he feels a little dizzy. Assisted back to bed, took patient a few minutes to return to regular RR/RE. Admin PRN breathing treatment, O2 97% on RA. Bed alarm on, call light within reach.

## 2023-02-18 NOTE — Plan of Care (Signed)
  Problem: Education: Goal: Knowledge of General Education information will improve Description Including pain rating scale, medication(s)/side effects and non-pharmacologic comfort measures Outcome: Progressing   Problem: Health Behavior/Discharge Planning: Goal: Ability to manage health-related needs will improve Outcome: Progressing   

## 2023-02-18 NOTE — Discharge Summary (Signed)
Physician Discharge Summary   Patient: Jason Wong MRN: [id number] DOB: 12/02/1955  Admit date:     02/17/2023  Discharge date: 02/18/23  Discharge Physician: Debarah Crape   PCP: Pcp, No   Recommendations at discharge:    Follow with your PCP within 2 weeks.  Discharge Diagnoses: Principal Problem:   Acute exacerbation of chronic obstructive pulmonary disease (COPD) (HCC) Active Problems:   COPD with acute exacerbation (HCC)   Acute respiratory failure with hypoxia (HCC)   HTN (hypertension)   HLD (hyperlipidemia)   Tobacco use  Resolved Problems:   * No resolved hospital problems. [facility] Course: This patient is a 67 year old male with hypertension, hyperlipidemia, asthma, alcohol use disorder, tobacco use disorder who presents to the ED for shortness of breath.  Patient reported prior to arrival he had been smoking a few cigarettes when he developed shortness of breath, and dizziness with standing.  He also endorses ongoing wheezing and reported he had recently went ran out of his inhaler.  On arrival to the ED he was found to be dyspneic, tachypneic, and having wheezing.  He was treated with magnesium, steroids, and breathing treatments.  He was also found to have a mild AKI.  Chest x-ray was unremarkable, with no leukocytosis or fever.  He was admitted for overnight observation and treatment of COPD exacerbation. By evaluation on 11/30 the patient's AKI was beginning to resolve.  He had no further wheezing and endorsed feeling much improved.  He was maintaining oxygen saturations above 98% on room air.  He will need to continue taking prednisone outpatient for 5 days.  He was given refills of his inhalers and nebulizer treatments.  Assessment and Plan: No notes have been filed under this hospital service. Service: Hospitalist        Consultants: none Procedures performed: none  Disposition: Home Diet recommendation:  Discharge Diet Orders (From admission, onward)      Start     Ordered   02/18/23 0000  Diet - low sodium heart healthy        02/18/23 1458           Regular diet DISCHARGE MEDICATION: Allergies as of 02/18/2023   No Known Allergies      Medication List     STOP taking these medications    ibuprofen 200 MG tablet Commonly known as: ADVIL   ipratropium-albuterol 0.5-2.5 (3) MG/3ML Soln Commonly known as: DUONEB   Trelegy Ellipta 200-62.5-25 MCG/ACT Aepb Generic drug: Fluticasone-Umeclidin-Vilant       TAKE these medications    acetaminophen 500 MG tablet Commonly known as: TYLENOL Take 500 mg by mouth as needed for mild pain (pain score 1-3) or headache.   albuterol (2.5 MG/3ML) 0.083% nebulizer solution Commonly known as: PROVENTIL Take 3 mLs (2.5 mg total) by nebulization every 4 (four) hours as needed for shortness of breath. What changed: You were already taking a medication with the same name, and this prescription was added. Make sure you understand how and when to take each.   albuterol 108 (90 Base) MCG/ACT inhaler Commonly known as: VENTOLIN HFA Inhale 2 puffs into the lungs every 4 (four) hours as needed for wheezing or shortness of breath. What changed: Another medication with the same name was added. Make sure you understand how and when to take each.   amLODipine 5 MG tablet Commonly known as: NORVASC Take 5 mg by mouth daily.   atorvastatin 10 MG tablet Commonly known as: LIPITOR Take 10 mg by  mouth daily.   fluticasone furoate-vilanterol 200-25 MCG/ACT Aepb Commonly known as: BREO ELLIPTA Inhale 1 puff into the lungs daily. Start taking on: February 19, 2023   predniSONE 20 MG tablet Commonly known as: DELTASONE Take 2 tabs daily for 2 days, then 1 tab daily for 3 days.        Discharge Exam: Filed Weights   02/17/23 1144  Weight: 54.4 kg   Constitutional:  Normal appearance. Non toxic-appearing.  HENT: Head Normocephalic and atraumatic.  Mucous membranes are moist.  Eyes:   Extraocular intact. Conjunctivae normal. Pupils are equal, round, and reactive to light.  Cardiovascular: Rate and Rhythm: Normal rate and regular rhythm.  Pulmonary: Non labored, symmetric rise of chest wall. No wheezing. Good aeration b/l. Musculoskeletal:  Normal range of motion.  Skin: warm and dry. not jaundiced.  Neurological: No focal deficit present. alert. Oriented. Psychiatric: Mood and Affect congruent.    Condition at discharge: good  The results of significant diagnostics from this hospitalization (including imaging, microbiology, ancillary and laboratory) are listed below for reference.   Imaging Studies: DG Chest 2 View  Result Date: 02/17/2023 CLINICAL DATA:  Shortness of breath EXAM: CHEST - 2 VIEW COMPARISON:  01/30/2023 FINDINGS: The heart size and mediastinal contours are within normal limits. Both lungs are clear. The visualized skeletal structures are unremarkable. IMPRESSION: No active cardiopulmonary disease. Electronically Signed   By: Duanne Guess D.O.   On: 02/17/2023 12:26   VAS [place] ABI WITH/WO TBI  Result Date: 01/31/2023  LOWER EXTREMITY DOPPLER STUDY Patient Name:  KHALIB KERNS  Date of Exam:   01/31/2023 Medical Rec #: [id number]     Accession #:    [id number] Date of Birth: 27-Jan-1956     Patient Gender: M Patient Age:   85 years Exam Location:  [facility] Procedure:      VAS [place] ABI WITH/WO TBI Referring Phys: NISCHAL NARENDRA --------------------------------------------------------------------------------  Indications: Rest pain. High Risk Factors: Hypertension, hyperlipidemia, past history of smoking.  Comparison Study: No prior study Performing Technologist: Shona Simpson  Examination Guidelines: A complete evaluation includes at minimum, Doppler waveform signals and systolic blood pressure reading at the level of bilateral brachial, anterior tibial, and posterior tibial arteries, when vessel segments are accessible. Bilateral testing is considered  an integral part of a complete examination. Photoelectric Plethysmograph (PPG) waveforms and toe systolic pressure readings are included as required and additional duplex testing as needed. Limited examinations for reoccurring indications may be performed as noted.  ABI Findings: +--------+------------------+-----+---------+--------+ Right   Rt Pressure (mmHg)IndexWaveform Comment  +--------+------------------+-----+---------+--------+ [id number]                    triphasic         +--------+------------------+-----+---------+--------+ PTA     141               1.22 triphasic         +--------+------------------+-----+---------+--------+ DP      133               1.15 triphasic         +--------+------------------+-----+---------+--------+ +--------+------------------+-----+-----------+-------+ Left    Lt Pressure (mmHg)IndexWaveform   Comment +--------+------------------+-----+-----------+-------+ [id number]                    triphasic          +--------+------------------+-----+-----------+-------+ PTA     130               1.12 triphasic          +--------+------------------+-----+-----------+-------+  DP      129               1.11 multiphasic        +--------+------------------+-----+-----------+-------+ +-------+-----------+-----------+------------+------------+ ABI/TBIToday's ABIToday's TBIPrevious ABIPrevious TBI +-------+-----------+-----------+------------+------------+ Right  1.22                                           +-------+-----------+-----------+------------+------------+ Left   1.12                                           +-------+-----------+-----------+------------+------------+  Summary: Right: Resting right ankle-brachial index is within normal range. Left: Resting left ankle-brachial index is within normal range. *See table(s) above for measurements and observations.  Electronically signed by Lemar Livings MD on  01/31/2023 at 7:00:58 PM.    Final    DG Chest Portable 1 View  Result Date: 01/30/2023 CLINICAL DATA:  67 year old male with respiratory distress. EXAM: PORTABLE CHEST 1 VIEW COMPARISON:  Chest radiographs 01/23/2023 and earlier. FINDINGS: Portable AP upright view at 0612 hours. Chronic hyperinflation and emphysema demonstrated on CTA this year. Mediastinal contours are stable, within normal limits. Mild but increased since October bilateral perihilar and basilar predominant reticular interstitial lung markings. No superimposed pneumothorax, pulmonary edema, pleural effusion or consolidation. Visualized tracheal air column is within normal limits. No acute osseous abnormality identified. Negative visible bowel gas. IMPRESSION: Emphysema ([id number]) with evidence of acute bilateral interstitial infectious exacerbation. No consolidation or pleural effusion. Electronically Signed   By: Odessa Fleming M.D.   On: 01/30/2023 06:42    Microbiology: Results for orders placed or performed during the hospital encounter of 01/30/23  Resp panel by RT-PCR (RSV, Flu A&B, Covid) Anterior Nasal Swab     Status: None   Collection Time: 01/30/23  9:27 AM   Specimen: Anterior Nasal Swab  Result Value Ref Range Status   SARS Coronavirus 2 by RT PCR NEGATIVE NEGATIVE Final   Influenza A by PCR NEGATIVE NEGATIVE Final   Influenza B by PCR NEGATIVE NEGATIVE Final    Comment: (NOTE) The Xpert Xpress SARS-CoV-2/FLU/RSV plus assay is intended as an aid in the diagnosis of influenza from Nasopharyngeal swab specimens and should not be used as a sole basis for treatment. Nasal washings and aspirates are unacceptable for Xpert Xpress SARS-CoV-2/FLU/RSV testing.  Fact Sheet for Patients: BloggerCourse.com  Fact Sheet for Healthcare Providers: SeriousBroker.it  This test is not yet approved or cleared by the [place] FDA and has been authorized for detection and/or  diagnosis of SARS-CoV-2 by FDA under an Emergency Use Authorization (EUA). This EUA will remain in effect (meaning this test can be used) for the duration of the COVID-19 declaration under Section 564(b)(1) of the Act, 21 U.S.C. section 360bbb-3(b)(1), unless the authorization is terminated or revoked.     Resp Syncytial Virus by PCR NEGATIVE NEGATIVE Final    Comment: (NOTE) Fact Sheet for Patients: BloggerCourse.com  Fact Sheet for Healthcare Providers: SeriousBroker.it  This test is not yet approved or cleared by the [place] FDA and has been authorized for detection and/or diagnosis of SARS-CoV-2 by FDA under an Emergency Use Authorization (EUA). This EUA will remain in effect (meaning this test can be used) for the duration of the COVID-19 declaration under Section 564(b)(1) of the Act, 21  U.S.C. section 360bbb-3(b)(1), unless the authorization is terminated or revoked.  Performed at [name], [address], [place], [place] [address]   Culture, blood (Routine X 2) w Reflex to ID Panel     Status: None   Collection Time: 01/30/23  2:50 PM   Specimen: BLOOD  Result Value Ref Range Status   Specimen Description BLOOD SITE NOT SPECIFIED  Final   Special Requests   Final    BOTTLES DRAWN AEROBIC ONLY Blood Culture results may not be optimal due to an inadequate volume of blood received in culture bottles   Culture   Final    NO GROWTH 5 DAYS Performed at [facility], [address], [place], [place] [address]    Report Status 02/04/2023 FINAL  Final  Culture, blood (Routine X 2) w Reflex to ID Panel     Status: None   Collection Time: 01/30/23  2:50 PM   Specimen: BLOOD  Result Value Ref Range Status   Specimen Description BLOOD SITE NOT SPECIFIED  Final   Special Requests   Final    BOTTLES DRAWN AEROBIC ONLY Blood Culture results may not be optimal due to an inadequate volume of blood received in  culture bottles   Culture   Final    NO GROWTH 5 DAYS Performed at [facility], [address], [place], [place] [address]    Report Status 02/04/2023 FINAL  Final    Labs: CBC: Recent Labs  Lab 02/17/23 1149 02/17/23 2345  WBC 5.5 5.0  NEUTROABS  --  4.4  HGB 13.9 14.0  HCT 43.1 40.7  MCV 98.6 97.1  PLT 253 249   Basic Metabolic Panel: Recent Labs  Lab 02/17/23 1149 02/17/23 2345 02/18/23 1208  NA 145 140 139  K 4.5 4.0 4.0  CL 110 109 108  CO2 25 21* 21*  GLUCOSE 82 177* 120*  BUN 16 16 15   CREATININE 1.25* 1.34* 1.08  CALCIUM 9.8 9.4 9.7   Liver Function Tests: No results for input(s): "AST", "ALT", "ALKPHOS", "BILITOT", "PROT", "ALBUMIN" in the last 168 hours. CBG: No results for input(s): "GLUCAP" in the last 168 hours.  Discharge time spent: greater than 30 minutes.  Signed: Debarah Crape, DO [facility] 02/18/2023

## 2023-02-18 NOTE — Care Management Obs Status (Signed)
MEDICARE OBSERVATION STATUS NOTIFICATION   Patient Details  Name: Jason Wong MRN: [id number] Date of Birth: Mar 30, 1955   Medicare Observation Status Notification Given:  Yes    Lawerance Sabal, RN 02/18/2023, 3:03 PM

## 2023-02-18 NOTE — Evaluation (Signed)
Occupational Therapy Evaluation Patient Details Name: Jason Wong MRN: [id number] DOB: 09-Aug-1955 Today's Date: 02/18/2023   History of Present Illness 67 y.o. male presents to [facility] hospital on 01/30/2023 with SOB and cough. Chest x-ray is concerning for multifocal PNA. PMH includes HTN, HLD, COPD, asthma, alcohol and tobacco use disorders.   Clinical Impression   Pt admitted for above, presents close to baseline as he ambulated in hall with supervision no AD but normally uses his SPC at baseline. Pt was able to ambulate >153ft without rest break, completed his ADLs independently while sitting EOB. Pt has no further acute skilled OT needs, no post acute OT needed at this time.        If plan is discharge home, recommend the following: Other (comment) (n/a)    Functional Status Assessment  Patient has not had a recent decline in their functional status  Equipment Recommendations  None recommended by OT    Recommendations for Other Services       Precautions / Restrictions Restrictions Weight Bearing Restrictions: No      Mobility Bed Mobility Overal bed mobility: Independent                  Transfers Overall transfer level: Needs assistance Equipment used: None Transfers: Sit to/from Stand Sit to Stand: Supervision           General transfer comment: supervision for safety, pt cane not in room      Balance Overall balance assessment: Mild deficits observed, not formally tested                                         ADL either performed or assessed with clinical judgement   ADL Overall ADL's : Independent                                       General ADL Comments: Pt walking in hall with supervision for safety, independently demonstrated ability to complete seated bathing and dressing activities independently. based on mobility pt can complete standing ADLs independently     Vision         Perception          Praxis         Pertinent Vitals/Pain Pain Assessment Pain Assessment: No/denies pain     Extremity/Trunk Assessment Upper Extremity Assessment Upper Extremity Assessment: Overall WFL for tasks assessed   Lower Extremity Assessment Lower Extremity Assessment: Defer to PT evaluation   Cervical / Trunk Assessment Cervical / Trunk Assessment: Normal   Communication Communication Communication: No apparent difficulties Cueing Techniques: Verbal cues   Cognition Arousal: Alert Behavior During Therapy: WFL for tasks assessed/performed Overall Cognitive Status: Within Functional Limits for tasks assessed                                       General Comments  VSS on RA, notified RN that pt cane is missing from room. Pt unsure if he got it during transport. Bp sitting 150/114(125) and 155/137(145) standing. Pt with reports of wheezing at end of session, sp02 >95% RA    Exercises     Shoulder Instructions      Home Living Family/patient expects to be discharged to::  Private residence Living Arrangements: Other relatives (nephew and neice) Available Help at Discharge: Family;Available 24 hours/day Type of Home: House Home Access: Stairs to enter [employer] of Steps: 3 Entrance Stairs-Rails: Can reach both Home Layout: Two level;Able to live on main level with bedroom/bathroom Alternate Level Stairs-Number of Steps: 12 Alternate Level Stairs-Rails: Can reach both Bathroom Shower/Tub: Producer, television/film/video: Standard     Home Equipment: [employer] - single point          Prior Functioning/Environment Prior Level of Function : Independent/Modified Independent             Mobility Comments: Used cane at baseline ADLs Comments: ind, does not drive        OT Problem List: Decreased activity tolerance      OT Treatment/Interventions:      OT Goals(Current goals can be found in the care plan section) Acute Rehab OT  Goals Patient Stated Goal: To get something to eat OT Goal Formulation: With patient Time For Goal Achievement: 03/04/23 Potential to Achieve Goals: Good  OT Frequency:      Co-evaluation              AM-PAC OT "6 Clicks" Daily Activity     Outcome Measure Help from another person eating meals?: None Help from another person taking care of personal grooming?: None Help from another person toileting, which includes using toliet, bedpan, or urinal?: None Help from another person bathing (including washing, rinsing, drying)?: None Help from another person to put on and taking off regular upper body clothing?: None Help from another person to put on and taking off regular lower body clothing?: None 6 Click Score: 24   End of Session Nurse Communication: Mobility status  Activity Tolerance: Patient tolerated treatment well Patient left: in bed;with call bell/phone within reach  OT Visit Diagnosis: Other (comment) (SOB)                Time: 4540-9811 OT Time Calculation (min): 19 min Charges:  OT General Charges $OT Visit: 1 Visit OT Evaluation $OT Eval Low Complexity: 1 Low  02/18/2023  AB, OTR/L  Acute Rehabilitation Services  Office: [phone]   Tristan Schroeder 02/18/2023, 10:22 AM

## 2023-02-18 NOTE — Plan of Care (Signed)

## 2023-02-18 NOTE — Evaluation (Signed)
Physical Therapy Evaluation and Discharge  Patient Details Name: Jason Wong MRN: [id number] DOB: 1955-12-12 Today's Date: 02/18/2023  History of Present Illness  Pt is a 67 y/o male who presents 02/17/2023 with SOB, dizziness, wheezing after smoking cigarettes. PMH significant for HTN, COPD/asthma, ETOH and tobacco use.   Clinical Impression  Patient evaluated by Physical Therapy with no further acute PT needs identified. All education has been completed and the patient has no further questions. At the time of PT eval pt was eager to mobilize. He was able to demonstrate transfers and ambulation with modified independence, with SpO2 remaining between 94-97% on RA. Pt mildly dyspneic at times however able to carry on a conversation throughout gait training. Pt reports he left his cane down in the ED. RN reports she has already called to inquire about it however cane has not been located. May need another cane for d/c if his is unable to located. Pt is currently at functional baseline. See below for any follow-up Physical Therapy or equipment needs. PT is signing off. Thank you for this referral.         If plan is discharge home, recommend the following: A little help with walking and/or transfers;Assistance with cooking/housework;Assist for transportation   Can travel by private vehicle        Equipment Recommendations None recommended by PT  Recommendations for Other Services       Functional Status Assessment Patient has had a recent decline in their functional status and demonstrates the ability to make significant improvements in function in a reasonable and predictable amount of time.     Precautions / Restrictions Precautions Precautions: Fall Restrictions Weight Bearing Restrictions: No      Mobility  Bed Mobility Overal bed mobility: Independent Bed Mobility: Supine to Sit, Sit to Supine                Transfers Overall transfer level: Modified  independent Equipment used: None Transfers: Sit to/from Stand             General transfer comment: No assist required. No unsteadiness or LOB noted.    Ambulation/Gait Ambulation/Gait assistance: Modified independent (Device/Increase time) Gait Distance (Feet): 600 Feet Assistive device: None Gait Pattern/deviations: Step-through pattern, Decreased stride length, Narrow base of support Gait velocity: Decreased Gait velocity interpretation: 1.31 - 2.62 ft/sec, indicative of limited community ambulator   General Gait Details: Pt on RA throughout gait training. Pt maintained SpO2 94-97%. Mildly dyspneic at times but able to carry on a conversation easily throughout gait training.  Stairs            Wheelchair Mobility     Tilt Bed    Modified Rankin (Stroke Patients Only)       Balance Overall balance assessment: Mild deficits observed, not formally tested Sitting-balance support: No upper extremity supported, Feet supported Sitting balance-Leahy Scale: Good     Standing balance support: Single extremity supported, During functional activity Standing balance-Leahy Scale: Fair                               Pertinent Vitals/Pain Pain Assessment Pain Assessment: No/denies pain    Home Living Family/patient expects to be discharged to:: Private residence Living Arrangements: Other relatives (nephew and niece) Available Help at Discharge: Family;Available 24 hours/day Type of Home: House Home Access: Stairs to enter Entrance Stairs-Rails: Can reach both Entrance Stairs-Number of Steps: 3 Alternate Level Stairs-Number of Steps: 12  Home Layout: Two level;Able to live on main level with bedroom/bathroom Home Equipment: Cane - single point      Prior Function Prior Level of Function : Independent/Modified Independent             Mobility Comments: Used cane at baseline ADLs Comments: ind, does not drive     Extremity/Trunk Assessment    Upper Extremity Assessment Upper Extremity Assessment: Overall WFL for tasks assessed    Lower Extremity Assessment Lower Extremity Assessment: LLE deficits/detail LLE Deficits / Details: Bilateral strength 4+/5 in quads, hamstrings and ankle DF. Pt holding his L hip during ambulation at times. Reports he has baseline issues with that leg but could not remember what he was told was wrong with it    Cervical / Trunk Assessment Cervical / Trunk Assessment: Normal  Communication   Communication Communication: No apparent difficulties Cueing Techniques: Verbal cues;Gestural cues  Cognition Arousal: Alert Behavior During Therapy: WFL for tasks assessed/performed Overall Cognitive Status: Within Functional Limits for tasks assessed                                          General Comments      Exercises     Assessment/Plan    PT Assessment Patient does not need any further PT services  PT Problem List         PT Treatment Interventions Gait training;Stair training;Functional mobility training;Therapeutic activities;Therapeutic exercise;Balance training;Neuromuscular re-education;Patient/family education    PT Goals (Current goals can be found in the Care Plan section)  Acute Rehab PT Goals Patient Stated Goal: To return home PT Goal Formulation: All assessment and education complete, DC therapy    Frequency Min 1X/week     Co-evaluation               AM-PAC PT "6 Clicks" Mobility  Outcome Measure Help needed turning from your back to your side while in a flat bed without using bedrails?: None Help needed moving from lying on your back to sitting on the side of a flat bed without using bedrails?: None Help needed moving to and from a bed to a chair (including a wheelchair)?: None Help needed standing up from a chair using your arms (e.g., wheelchair or bedside chair)?: None Help needed to walk in hospital room?: None Help needed climbing 3-5 steps  with a railing? : A Little 6 Click Score: 23    End of Session Equipment Utilized During Treatment: Gait belt Activity Tolerance: Patient tolerated treatment well Patient left: in bed;with call bell/phone within reach Nurse Communication: Mobility status PT Visit Diagnosis: Other abnormalities of gait and mobility (R26.89)    Time: 0865-7846 PT Time Calculation (min) (ACUTE ONLY): 18 min   Charges:   PT Evaluation $PT Eval Low Complexity: 1 Low   PT General Charges $$ ACUTE PT VISIT: 1 Visit         Conni Slipper, PT, DPT Acute Rehabilitation Services Secure Chat Preferred Office: [phone]   Marylynn Pearson 02/18/2023, 2:40 PM

## 2023-02-18 NOTE — Hospital Course (Addendum)
This patient is a 67 year old male with hypertension, hyperlipidemia, asthma, alcohol use disorder, tobacco use disorder who presents to the ED for shortness of breath.  Patient reported prior to arrival he had been smoking a few cigarettes when he developed shortness of breath, and dizziness with standing.  He also endorses ongoing wheezing and reported he had recently went ran out of his inhaler.  On arrival to the ED he was found to be dyspneic, tachypneic, and having wheezing.  He was treated with magnesium, steroids, and breathing treatments.  He was also found to have a mild AKI.  Chest x-ray was unremarkable, with no leukocytosis or fever.  He was admitted for overnight observation and treatment of COPD exacerbation. By evaluation on 11/30 the patient's AKI was beginning to resolve.  He had no further wheezing and endorsed feeling much improved.  He was maintaining oxygen saturations above 98% on room air.  He will need to continue taking prednisone outpatient for 5 days.  He was given refills of his inhalers and nebulizer treatments.

## 2023-02-18 NOTE — Care Management CC44 (Signed)
Condition Code 44 Documentation Completed  Patient Details  Name: Jason Wong MRN: [id number] Date of Birth: 11/03/1955   Condition Code 44 given:  Yes Patient signature on Condition Code 44 notice:  Yes Documentation of 2 MD's agreement:  Yes Code 44 added to claim:  Yes    Lawerance Sabal, RN 02/18/2023, 3:03 PM

## 2023-02-20 ENCOUNTER — Other Ambulatory Visit: Payer: Self-pay

## 2023-03-16 ENCOUNTER — Ambulatory Visit: Payer: Medicare Other | Admitting: Gastroenterology

## 2023-03-29 ENCOUNTER — Other Ambulatory Visit: Payer: Self-pay

## 2023-03-29 ENCOUNTER — Other Ambulatory Visit: Payer: Self-pay | Admitting: Student

## 2023-03-29 ENCOUNTER — Other Ambulatory Visit: Payer: Self-pay | Admitting: Internal Medicine

## 2023-03-29 DIAGNOSIS — G4709 Other insomnia: Secondary | ICD-10-CM

## 2023-03-29 DIAGNOSIS — R630 Anorexia: Secondary | ICD-10-CM

## 2023-03-29 MED ORDER — FAMOTIDINE 20 MG PO TABS
20.0000 mg | ORAL_TABLET | Freq: Two times a day (BID) | ORAL | 2 refills | Status: DC
  Filled 2023-03-29: qty 60, 30d supply, fill #0

## 2023-03-29 MED ORDER — PANTOPRAZOLE SODIUM 40 MG PO TBEC
40.0000 mg | DELAYED_RELEASE_TABLET | Freq: Every day | ORAL | 2 refills | Status: DC
  Filled 2023-03-29: qty 30, 30d supply, fill #0

## 2023-03-29 NOTE — Telephone Encounter (Signed)
 Not imc patient

## 2023-03-30 ENCOUNTER — Encounter (HOSPITAL_COMMUNITY): Payer: Self-pay

## 2023-04-03 ENCOUNTER — Telehealth: Payer: Self-pay | Admitting: Internal Medicine

## 2023-04-03 ENCOUNTER — Other Ambulatory Visit: Payer: Self-pay

## 2023-04-03 ENCOUNTER — Telehealth: Payer: Self-pay

## 2023-04-03 NOTE — Telephone Encounter (Signed)
 Patient requesting Supplies for CPAP Machine. Unsure if patient is has a CPAP machine unable to locate this information in patient Chart. Call placed to patient for clarification. Unable to reach message left on VM

## 2023-04-03 NOTE — Telephone Encounter (Signed)
DISCARD

## 2023-04-04 ENCOUNTER — Other Ambulatory Visit: Payer: Self-pay

## 2023-04-08 ENCOUNTER — Emergency Department (HOSPITAL_COMMUNITY): Payer: Medicare Other

## 2023-04-08 ENCOUNTER — Encounter (HOSPITAL_COMMUNITY): Payer: Self-pay

## 2023-04-08 ENCOUNTER — Emergency Department (HOSPITAL_COMMUNITY)
Admission: EM | Admit: 2023-04-08 | Discharge: 2023-04-08 | Disposition: A | Discharge status: Home / Self Care | Payer: Medicare Other | Attending: Emergency Medicine | Admitting: Emergency Medicine

## 2023-04-08 ENCOUNTER — Other Ambulatory Visit: Payer: Self-pay

## 2023-04-08 DIAGNOSIS — J441 Chronic obstructive pulmonary disease with (acute) exacerbation: Secondary | ICD-10-CM | POA: Insufficient documentation

## 2023-04-08 DIAGNOSIS — I10 Essential (primary) hypertension: Secondary | ICD-10-CM | POA: Insufficient documentation

## 2023-04-08 DIAGNOSIS — F17200 Nicotine dependence, unspecified, uncomplicated: Secondary | ICD-10-CM | POA: Diagnosis not present

## 2023-04-08 DIAGNOSIS — J45909 Unspecified asthma, uncomplicated: Secondary | ICD-10-CM | POA: Insufficient documentation

## 2023-04-08 DIAGNOSIS — R0602 Shortness of breath: Secondary | ICD-10-CM | POA: Diagnosis not present

## 2023-04-08 DIAGNOSIS — Z79899 Other long term (current) drug therapy: Secondary | ICD-10-CM | POA: Diagnosis not present

## 2023-04-08 DIAGNOSIS — R059 Cough, unspecified: Secondary | ICD-10-CM | POA: Diagnosis not present

## 2023-04-08 LAB — CBC WITH DIFFERENTIAL/PLATELET
Abs Immature Granulocytes: 0.01 10*3/uL (ref 0.00–0.07)
Basophils Absolute: 0.1 10*3/uL (ref 0.0–0.1)
Basophils Relative: 2 %
Eosinophils Absolute: 0.7 10*3/uL — ABNORMAL HIGH (ref 0.0–0.5)
Eosinophils Relative: 10 %
HCT: 41.7 % (ref 39.0–52.0)
Hemoglobin: 13.7 g/dL (ref 13.0–17.0)
Immature Granulocytes: 0 %
Lymphocytes Relative: 33 %
Lymphs Abs: 2.4 10*3/uL (ref 0.7–4.0)
MCH: 33.6 pg (ref 26.0–34.0)
MCHC: 32.9 g/dL (ref 30.0–36.0)
MCV: 102.2 fL — ABNORMAL HIGH (ref 80.0–100.0)
Monocytes Absolute: 0.6 10*3/uL (ref 0.1–1.0)
Monocytes Relative: 8 %
Neutro Abs: 3.4 10*3/uL (ref 1.7–7.7)
Neutrophils Relative %: 47 %
Platelets: 366 10*3/uL (ref 150–400)
RBC: 4.08 MIL/uL — ABNORMAL LOW (ref 4.22–5.81)
RDW: 13.8 % (ref 11.5–15.5)
WBC: 7.3 10*3/uL (ref 4.0–10.5)
nRBC: 0 % (ref 0.0–0.2)

## 2023-04-08 LAB — COMPREHENSIVE METABOLIC PANEL
ALT: 16 U/L (ref 0–44)
AST: 26 U/L (ref 15–41)
Albumin: 4.4 g/dL (ref 3.5–5.0)
Alkaline Phosphatase: 76 U/L (ref 38–126)
Anion gap: 18 — ABNORMAL HIGH (ref 5–15)
BUN: 6 mg/dL — ABNORMAL LOW (ref 8–23)
CO2: 22 mmol/L (ref 22–32)
Calcium: 9.6 mg/dL (ref 8.9–10.3)
Chloride: 104 mmol/L (ref 98–111)
Creatinine, Ser: 0.97 mg/dL (ref 0.61–1.24)
GFR, Estimated: >60 mL/min (ref >60)
Glucose, Bld: 84 mg/dL (ref 70–99)
Potassium: 3.7 mmol/L (ref 3.5–5.1)
Sodium: 144 mmol/L (ref 135–145)
Total Bilirubin: 0.9 mg/dL (ref 0.0–1.2)
Total Protein: 7.6 g/dL (ref 6.5–8.1)

## 2023-04-08 MED ORDER — PREDNISONE 20 MG PO TABS
40.0000 mg | ORAL_TABLET | Freq: Every day | ORAL | 0 refills | Status: DC
  Filled 2023-04-08: qty 10, 5d supply, fill #0

## 2023-04-08 MED ORDER — ALBUTEROL SULFATE (2.5 MG/3ML) 0.083% IN NEBU
2.5000 mg | INHALATION_SOLUTION | Freq: Four times a day (QID) | RESPIRATORY_TRACT | 12 refills | Status: DC | PRN
  Filled 2023-04-08: qty 75, 7d supply, fill #0

## 2023-04-08 MED ORDER — METHYLPREDNISOLONE SODIUM SUCC 125 MG IJ SOLR
125.0000 mg | Freq: Once | INTRAMUSCULAR | Status: AC
  Administered 2023-04-08: 125 mg via INTRAVENOUS
  Filled 2023-04-08: qty 2

## 2023-04-08 MED ORDER — ALBUTEROL SULFATE HFA 108 (90 BASE) MCG/ACT IN AERS
2.0000 | INHALATION_SPRAY | Freq: Once | RESPIRATORY_TRACT | Status: AC
  Administered 2023-04-08: 2 via RESPIRATORY_TRACT
  Filled 2023-04-08: qty 6.7

## 2023-04-08 MED ORDER — IPRATROPIUM-ALBUTEROL 0.5-2.5 (3) MG/3ML IN SOLN: 3.0000 mL | Freq: Once | RESPIRATORY_TRACT | Status: DC

## 2023-04-08 MED ORDER — ALBUTEROL SULFATE HFA 108 (90 BASE) MCG/ACT IN AERS
2.0000 | INHALATION_SPRAY | RESPIRATORY_TRACT | 3 refills | Status: DC | PRN
  Filled 2023-04-08 – 2023-05-12 (×4): qty 6.7, 17d supply, fill #0

## 2023-04-08 MED ORDER — ALBUTEROL SULFATE (2.5 MG/3ML) 0.083% IN NEBU
10.0000 mg/h | INHALATION_SOLUTION | RESPIRATORY_TRACT | Status: AC
  Administered 2023-04-08: 10 mg/h via RESPIRATORY_TRACT
  Filled 2023-04-08: qty 12

## 2023-04-08 NOTE — ED Notes (Signed)
Patient transported to X-ray 

## 2023-04-08 NOTE — ED Notes (Signed)
Discharge instructions reviewed with patient. Pt in no acute distress at time of discharge, alert and oriented x4. Bus pass given to patient.

## 2023-04-08 NOTE — ED Triage Notes (Addendum)
Patient complains of sob for 1 days. Has ben using nebulizer with no relief. Patient denies cough, no fever, no chills. Patient alert and oriented, speaking complete sentences. Does have oxygen at home but doesn't use. Patient out of neb treatments

## 2023-04-08 NOTE — Discharge Instructions (Signed)
Your x-ray shows no signs of pneumonia, your testing has been reassuring and you have improved with the medications that we have given you.  We have given you an inhaler for home because her pharmacy is not open today.  Please get your other medications from your pharmacy tomorrow  Thank you for allowing [place] to treat you in the emergency department today.  After reviewing your examination and potential testing that was done it appears that you are safe to go home.  I would like for you to follow-up with your doctor within the next several days, have them obtain your records and follow-up with them to review all potential tests and results from your visit.  If you should develop severe or worsening symptoms return to the emergency department immediately  Albuterol is an inhaled medication which can help you to breathe better, you should take 2 puffs every 4 hours as needed, this may cause your heart to feel like it is racing, this should be a temporary side effect.   Prednisone is a steroid that helps to reduce certain types of inflammation and may be used for allergic reactions, some rashes such as poison ivy or dermatitis, for asthma attacks or bronchitis and for certain types of pain.  Please take this medicine exactly as prescribed - 40mg  by mouth daily for 5 days.  This can have certain side effects with some people including feeling like you can't sleep, feeling anxious or feeling like you are on a "high".  It should not cause weight gain if only taken for a short time.  Please be aware that this medication may also cause an elevation in your blood sugar if you are a diabetic so if you are a diabetic you will need to keep a very close eye on your blood sugar, make sure that you are eating an extremely low level of carbohydrates and taking your medications exactly as prescribed.  If you should develop severe high blood sugar or start to feel poorly return to the emergency department immediately

## 2023-04-08 NOTE — ED Provider Notes (Signed)
[place] EMERGENCY DEPARTMENT AT [facility] Provider Note   CSN: [id number] Arrival date & time: 04/08/23  1115     History  No chief complaint on file.   Jason Wong is a 68 y.o. male.  HPI   Patient is a 68 year old male history of COPD, history of asthma, history of hypertension, states he has been out of his medications at home for his COPD and has had about 3 days of progressive shortness of breath.  He states he is coughing but it is nonproductive, no fevers or chills, he denies any chest pain and states he is not even tight in his chest.  The shortness of breath is constant.  He took the bus here today.  He states he went to see his family doctor but his medications were not refilled, currently smokes cigarettes, it appears his last visit to his family doctor was in November 2024, a phone call to the doctor was placed on January 13, at that time he was requesting supplies for his CPAP machine, they were unable to reach the patient.  According to the medical record the patient had been admitted with multifocal pneumonia in November 2024 and then admitted a couple of weeks later for shortness of breath.  Home Medications Prior to Admission medications   Medication Sig Start Date End Date Taking? Authorizing Provider  albuterol (PROVENTIL) (2.5 MG/3ML) 0.083% nebulizer solution Take 3 mLs (2.5 mg total) by nebulization every 6 (six) hours as needed for wheezing or shortness of breath. 04/08/23  Yes Eber Hong, MD  albuterol (VENTOLIN HFA) 108 (90 Base) MCG/ACT inhaler Inhale 2 puffs into the lungs every 4 (four) hours as needed for wheezing or shortness of breath. 04/08/23  Yes Eber Hong, MD  predniSONE (DELTASONE) 20 MG tablet Take 2 tablets (40 mg total) by mouth daily. 04/08/23  Yes Eber Hong, MD  acetaminophen (TYLENOL) 500 MG tablet Take 500 mg by mouth every 6 (six) hours as needed for moderate pain.    [provider]  acetaminophen (TYLENOL) 500  MG tablet Take 500 mg by mouth as needed for mild pain (pain score 1-3) or headache.    [provider]  amLODipine (NORVASC) 5 MG tablet Take 5 mg by mouth daily.    [provider]  amLODipine (NORVASC) 5 MG tablet Take 1 tablet (5 mg total) by mouth daily. 06/27/22   Claiborne Rigg, NP  amLODipine (NORVASC) 5 MG tablet Take 1 tablet (5 mg total) by mouth daily. 11/17/22   Roemhildt, Lorin T, PA-C  atorvastatin (LIPITOR) 10 MG tablet Take 10 mg by mouth daily.    [provider]  atorvastatin (LIPITOR) 10 MG tablet Take 1 tablet (10 mg total) by mouth daily. 06/27/22   Claiborne Rigg, NP  famotidine (PEPCID) 20 MG tablet Take 1 tablet (20 mg total) by mouth 2 (two) times daily. 03/29/23   Marcine Matar, MD  fluticasone furoate-vilanterol (BREO ELLIPTA) 200-25 MCG/ACT AEPB Inhale 1 puff into the lungs daily. 02/19/23   Dezii, Alexandra, DO  ipratropium-albuterol (DUONEB) 0.5-2.5 (3) MG/3ML SOLN Take 3 mLs by nebulization every 6 (six) hours as needed. 11/17/22   Roemhildt, Lorin T, PA-C  mirtazapine (REMERON) 7.5 MG tablet Take 1 tablet (7.5 mg total) by mouth at bedtime. 01/31/23 03/03/23  Morene Crocker, MD  oxyCODONE (OXY IR/ROXICODONE) 5 MG immediate release tablet Take 1 tablet (5 mg total) by mouth every 6 (six) hours as needed for severe pain (pain score 7-10).  01/17/23   Renne Crigler, PA-C  pantoprazole (PROTONIX) 40 MG tablet Take 1 tablet (40 mg total) by mouth daily. 03/29/23   Marcine Matar, MD  sucralfate (CARAFATE) 1 g tablet Take 1 tablet (1 g total) by mouth 4 (four) times daily -  with meals and at bedtime. 01/17/23   Renne Crigler, PA-C  thiamine (VITAMIN B-1) 100 MG tablet Take 1 tablet (100 mg total) by mouth daily. 06/24/22   Joseph Art, DO      Allergies    Tobacco    Review of Systems   Review of Systems  All other systems reviewed and are negative.   Physical Exam Updated Vital Signs BP (!) 137/94   Pulse 78   Temp 98.2  F (36.8 C) (Oral)   Resp 16   SpO2 95%  Physical Exam Vitals and nursing note reviewed.  Constitutional:      General: He is not in acute distress.    Appearance: He is well-developed.  HENT:     Head: Normocephalic and atraumatic.     Mouth/Throat:     Pharynx: No oropharyngeal exudate.  Eyes:     General: No scleral icterus.       Right eye: No discharge.        Left eye: No discharge.     Conjunctiva/sclera: Conjunctivae normal.     Pupils: Pupils are equal, round, and reactive to light.  Neck:     Thyroid: No thyromegaly.     Vascular: No JVD.  Cardiovascular:     Rate and Rhythm: Normal rate and regular rhythm.     Heart sounds: Normal heart sounds. No murmur heard.    No friction rub. No gallop.  Pulmonary:     Effort: Respiratory distress present.     Breath sounds: Normal breath sounds. No rales.     Comments: Decreased breath sounds, mild tachypnea, mild expiratory wheezing, no rhonchi or rales, tachypneic Abdominal:     General: Bowel sounds are normal. There is no distension.     Palpations: Abdomen is soft. There is no mass.     Tenderness: There is no abdominal tenderness.  Musculoskeletal:        General: No tenderness. Normal range of motion.     Cervical back: Normal range of motion and neck supple.     Right lower leg: No edema.     Left lower leg: No edema.  Lymphadenopathy:     Cervical: No cervical adenopathy.  Skin:    General: Skin is warm and dry.     Findings: No erythema or rash.  Neurological:     Mental Status: He is alert.     Coordination: Coordination normal.  Psychiatric:        Behavior: Behavior normal.     ED Results / Procedures / Treatments   Labs (all labs ordered are listed, but only abnormal results are displayed) Labs Reviewed  CBC WITH DIFFERENTIAL/PLATELET - Abnormal; Notable for the following components:      Result Value   RBC 4.08 (*)    MCV 102.2 (*)    Eosinophils Absolute 0.7 (*)    All other components  within normal limits  COMPREHENSIVE METABOLIC PANEL - Abnormal; Notable for the following components:   BUN 6 (*)    Anion gap 18 (*)    All other components within normal limits    EKG EKG Interpretation Date/Time:  Saturday April 08 2023 12:34:22 EST Ventricular Rate:  79 PR  Interval:  150 QRS Duration:  75 QT Interval:  381 QTC Calculation: 437 R Axis:   82  Text Interpretation: Sinus rhythm Atrial premature complex Probable left atrial enlargement Borderline right axis deviation Confirmed by Eber Hong ([id number]) on 04/08/2023 12:38:26 PM  Radiology DG Chest 2 View Result Date: 04/08/2023 CLINICAL DATA:  Cough, shortness of breath EXAM: CHEST - 2 VIEW COMPARISON:  02/17/2023 FINDINGS: The heart size and mediastinal contours are within normal limits. Both lungs are clear. The visualized skeletal structures are unremarkable. IMPRESSION: No active cardiopulmonary disease. Electronically Signed   By: Duanne Guess D.O.   On: 04/08/2023 14:37    Procedures Procedures    Medications Ordered in ED Medications  albuterol (PROVENTIL) (2.5 MG/3ML) 0.083% nebulizer solution (10 mg/hr Nebulization New Bag/Given 04/08/23 1233)  ipratropium-albuterol (DUONEB) 0.5-2.5 (3) MG/3ML nebulizer solution 3 mL (has no administration in time range)  albuterol (VENTOLIN HFA) 108 (90 Base) MCG/ACT inhaler 2 puff (has no administration in time range)  methylPREDNISolone sodium succinate (SOLU-MEDROL) 125 mg/2 mL injection 125 mg (125 mg Intravenous Given 04/08/23 1232)    ED Course/ Medical Decision Making/ A&P                                 Medical Decision Making Amount and/or Complexity of Data Reviewed Labs: ordered. Radiology: ordered. ECG/medicine tests: ordered.  Risk Prescription drug management.    This patient presents to the ED for concern of shortness of breath, this involves an extensive number of treatment options, and is a complaint that carries with it a high risk of  complications and morbidity.  The differential diagnosis includes COPD, pneumonia, pneumothorax, less likely be cardiac disease but EKG will be obtained   Co morbidities that complicate the patient evaluation  Ongoing tobacco use, COPD   Additional history obtained:  Additional history obtained from medical record External records from outside source obtained and reviewed including multiple admissions within the last few months   Lab Tests:  I Ordered, and personally interpreted labs.  The pertinent results include: No leukocytosis or anemia, metabolic panel is reassuring without acute findings   Imaging Studies ordered:  I ordered imaging studies including chest x-ray I independently visualized and interpreted imaging which showed no pneumonia and no pneumothorax I agree with the radiologist interpretation   Cardiac Monitoring: / EKG:  The patient was maintained on a cardiac monitor.  I personally viewed and interpreted the cardiac monitored which showed an underlying rhythm of: Normal sinus rhythm, no arrhythmias    Problem List / ED Course / Critical interventions / Medication management  The patient was given an albuterol continuous treatment as well as a DuoNeb and some steroids, on repeat exam his oxygen is 95 to 98%, his respiratory rate is 16 and he speaks in full sentences.  His lungs are much more clear, he is much more comfortable and feels comfortable going home with medications. I have reviewed the patients home medicines and have made adjustments as needed   Social Determinants of Health:  Chronic tobacco use   Test / Admission - Considered:  Considered admission but the patient was not hypoxic or in any distress after treatments and feels comfortable with the plan of discharge         Final Clinical Impression(s) / ED Diagnoses Final diagnoses:  COPD exacerbation (HCC)    Rx / DC Orders ED Discharge Orders  Ordered    predniSONE  (DELTASONE) 20 MG tablet  Daily        04/08/23 1513    albuterol (VENTOLIN HFA) 108 (90 Base) MCG/ACT inhaler  Every 4 hours PRN        04/08/23 1513    albuterol (PROVENTIL) (2.5 MG/3ML) 0.083% nebulizer solution  Every 6 hours PRN        04/08/23 1513              Eber Hong, MD 04/08/23 1515

## 2023-04-10 ENCOUNTER — Other Ambulatory Visit: Payer: Self-pay

## 2023-04-11 ENCOUNTER — Other Ambulatory Visit: Payer: Self-pay

## 2023-04-14 ENCOUNTER — Telehealth: Payer: Self-pay | Admitting: *Deleted

## 2023-04-14 NOTE — Progress Notes (Signed)
Transition Care Management Unsuccessful Follow-up Telephone Call  Date of discharge and from where:  [place]. [facility]  04/08/2023  Attempts:  1st Attempt  Reason for unsuccessful TCM follow-up call:  No answer/busy

## 2023-04-17 ENCOUNTER — Other Ambulatory Visit: Payer: Self-pay

## 2023-04-17 ENCOUNTER — Telehealth: Payer: Self-pay | Admitting: *Deleted

## 2023-04-17 NOTE — Progress Notes (Signed)
Transition Care Management Unsuccessful Follow-up Telephone Call  Date of discharge and from where:  [facility] [place]. [facility]  04/08/2023  Attempts:  2nd Attempt  Reason for unsuccessful TCM follow-up call:  Left voice message

## 2023-04-26 ENCOUNTER — Other Ambulatory Visit: Payer: Self-pay

## 2023-05-11 ENCOUNTER — Emergency Department (HOSPITAL_COMMUNITY)
Admission: EM | Admit: 2023-05-11 | Discharge: 2023-05-11 | Disposition: A | Discharge status: Home / Self Care | Payer: Medicare Other | Source: Home / Self Care | Attending: Emergency Medicine | Admitting: Emergency Medicine

## 2023-05-11 ENCOUNTER — Emergency Department (HOSPITAL_COMMUNITY): Payer: Medicare Other

## 2023-05-11 ENCOUNTER — Other Ambulatory Visit: Payer: Self-pay

## 2023-05-11 DIAGNOSIS — J439 Emphysema, unspecified: Secondary | ICD-10-CM | POA: Diagnosis not present

## 2023-05-11 DIAGNOSIS — J9621 Acute and chronic respiratory failure with hypoxia: Secondary | ICD-10-CM | POA: Diagnosis not present

## 2023-05-11 DIAGNOSIS — D7589 Other specified diseases of blood and blood-forming organs: Secondary | ICD-10-CM | POA: Diagnosis not present

## 2023-05-11 DIAGNOSIS — R739 Hyperglycemia, unspecified: Secondary | ICD-10-CM | POA: Diagnosis not present

## 2023-05-11 DIAGNOSIS — Z7951 Long term (current) use of inhaled steroids: Secondary | ICD-10-CM | POA: Diagnosis not present

## 2023-05-11 DIAGNOSIS — Z91128 Patient's intentional underdosing of medication regimen for other reason: Secondary | ICD-10-CM | POA: Diagnosis not present

## 2023-05-11 DIAGNOSIS — R109 Unspecified abdominal pain: Secondary | ICD-10-CM | POA: Diagnosis not present

## 2023-05-11 DIAGNOSIS — Z1152 Encounter for screening for COVID-19: Secondary | ICD-10-CM | POA: Diagnosis not present

## 2023-05-11 DIAGNOSIS — K219 Gastro-esophageal reflux disease without esophagitis: Secondary | ICD-10-CM | POA: Diagnosis not present

## 2023-05-11 DIAGNOSIS — R338 Other retention of urine: Secondary | ICD-10-CM | POA: Diagnosis not present

## 2023-05-11 DIAGNOSIS — J441 Chronic obstructive pulmonary disease with (acute) exacerbation: Secondary | ICD-10-CM | POA: Insufficient documentation

## 2023-05-11 DIAGNOSIS — R0689 Other abnormalities of breathing: Secondary | ICD-10-CM | POA: Diagnosis not present

## 2023-05-11 DIAGNOSIS — R062 Wheezing: Secondary | ICD-10-CM | POA: Diagnosis not present

## 2023-05-11 DIAGNOSIS — R404 Transient alteration of awareness: Secondary | ICD-10-CM | POA: Diagnosis not present

## 2023-05-11 DIAGNOSIS — E785 Hyperlipidemia, unspecified: Secondary | ICD-10-CM | POA: Diagnosis not present

## 2023-05-11 DIAGNOSIS — R310 Gross hematuria: Secondary | ICD-10-CM | POA: Diagnosis not present

## 2023-05-11 DIAGNOSIS — I499 Cardiac arrhythmia, unspecified: Secondary | ICD-10-CM | POA: Diagnosis not present

## 2023-05-11 DIAGNOSIS — J09X2 Influenza due to identified novel influenza A virus with other respiratory manifestations: Secondary | ICD-10-CM | POA: Diagnosis not present

## 2023-05-11 DIAGNOSIS — Z8249 Family history of ischemic heart disease and other diseases of the circulatory system: Secondary | ICD-10-CM | POA: Diagnosis not present

## 2023-05-11 DIAGNOSIS — E872 Acidosis, unspecified: Secondary | ICD-10-CM | POA: Diagnosis not present

## 2023-05-11 DIAGNOSIS — F17200 Nicotine dependence, unspecified, uncomplicated: Secondary | ICD-10-CM | POA: Diagnosis not present

## 2023-05-11 DIAGNOSIS — T380X6A Underdosing of glucocorticoids and synthetic analogues, initial encounter: Secondary | ICD-10-CM | POA: Diagnosis not present

## 2023-05-11 DIAGNOSIS — J101 Influenza due to other identified influenza virus with other respiratory manifestations: Secondary | ICD-10-CM | POA: Diagnosis not present

## 2023-05-11 DIAGNOSIS — N3091 Cystitis, unspecified with hematuria: Secondary | ICD-10-CM | POA: Diagnosis not present

## 2023-05-11 DIAGNOSIS — R339 Retention of urine, unspecified: Secondary | ICD-10-CM | POA: Diagnosis not present

## 2023-05-11 DIAGNOSIS — I10 Essential (primary) hypertension: Secondary | ICD-10-CM | POA: Diagnosis not present

## 2023-05-11 DIAGNOSIS — Z79899 Other long term (current) drug therapy: Secondary | ICD-10-CM | POA: Diagnosis not present

## 2023-05-11 DIAGNOSIS — R918 Other nonspecific abnormal finding of lung field: Secondary | ICD-10-CM | POA: Diagnosis not present

## 2023-05-11 DIAGNOSIS — Z743 Need for continuous supervision: Secondary | ICD-10-CM | POA: Diagnosis not present

## 2023-05-11 DIAGNOSIS — J9601 Acute respiratory failure with hypoxia: Secondary | ICD-10-CM | POA: Diagnosis not present

## 2023-05-11 DIAGNOSIS — T380X5A Adverse effect of glucocorticoids and synthetic analogues, initial encounter: Secondary | ICD-10-CM | POA: Diagnosis not present

## 2023-05-11 DIAGNOSIS — R0602 Shortness of breath: Secondary | ICD-10-CM | POA: Diagnosis not present

## 2023-05-11 DIAGNOSIS — E43 Unspecified severe protein-calorie malnutrition: Secondary | ICD-10-CM | POA: Diagnosis not present

## 2023-05-11 DIAGNOSIS — R059 Cough, unspecified: Secondary | ICD-10-CM | POA: Diagnosis not present

## 2023-05-11 DIAGNOSIS — Z681 Body mass index (BMI) 19 or less, adult: Secondary | ICD-10-CM | POA: Diagnosis not present

## 2023-05-11 LAB — CBC WITH DIFFERENTIAL/PLATELET
Abs Immature Granulocytes: 0 10*3/uL (ref 0.00–0.07)
Basophils Absolute: 0.1 10*3/uL (ref 0.0–0.1)
Basophils Relative: 2 %
Eosinophils Absolute: 0.6 10*3/uL — ABNORMAL HIGH (ref 0.0–0.5)
Eosinophils Relative: 12 %
HCT: 42.6 % (ref 39.0–52.0)
Hemoglobin: 14.3 g/dL (ref 13.0–17.0)
Immature Granulocytes: 0 %
Lymphocytes Relative: 35 %
Lymphs Abs: 1.7 10*3/uL (ref 0.7–4.0)
MCH: 33.5 pg (ref 26.0–34.0)
MCHC: 33.6 g/dL (ref 30.0–36.0)
MCV: 99.8 fL (ref 80.0–100.0)
Monocytes Absolute: 0.6 10*3/uL (ref 0.1–1.0)
Monocytes Relative: 13 %
Neutro Abs: 1.8 10*3/uL (ref 1.7–7.7)
Neutrophils Relative %: 38 %
Platelets: 380 10*3/uL (ref 150–400)
RBC: 4.27 MIL/uL (ref 4.22–5.81)
RDW: 13.7 % (ref 11.5–15.5)
WBC: 4.8 10*3/uL (ref 4.0–10.5)
nRBC: 0 % (ref 0.0–0.2)

## 2023-05-11 LAB — COMPREHENSIVE METABOLIC PANEL
ALT: 13 U/L (ref 0–44)
AST: 23 U/L (ref 15–41)
Albumin: 4.4 g/dL (ref 3.5–5.0)
Alkaline Phosphatase: 74 U/L (ref 38–126)
Anion gap: 17 — ABNORMAL HIGH (ref 5–15)
BUN: 9 mg/dL (ref 8–23)
CO2: 20 mmol/L — ABNORMAL LOW (ref 22–32)
Calcium: 10.4 mg/dL — ABNORMAL HIGH (ref 8.9–10.3)
Chloride: 107 mmol/L (ref 98–111)
Creatinine, Ser: 1.1 mg/dL (ref 0.61–1.24)
GFR, Estimated: >60 mL/min (ref >60)
Glucose, Bld: 85 mg/dL (ref 70–99)
Potassium: 4.1 mmol/L (ref 3.5–5.1)
Sodium: 144 mmol/L (ref 135–145)
Total Bilirubin: 1.2 mg/dL (ref 0.0–1.2)
Total Protein: 7.6 g/dL (ref 6.5–8.1)

## 2023-05-11 LAB — RESP PANEL BY RT-PCR (RSV, FLU A&B, COVID)  RVPGX2
Influenza A by PCR: NEGATIVE
Influenza B by PCR: NEGATIVE
Resp Syncytial Virus by PCR: NEGATIVE
SARS Coronavirus 2 by RT PCR: NEGATIVE

## 2023-05-11 MED ORDER — PREDNISONE 20 MG PO TABS
60.0000 mg | ORAL_TABLET | Freq: Once | ORAL | Status: AC
  Administered 2023-05-11: 60 mg via ORAL
  Filled 2023-05-11: qty 3

## 2023-05-11 MED ORDER — IPRATROPIUM-ALBUTEROL 0.5-2.5 (3) MG/3ML IN SOLN
3.0000 mL | RESPIRATORY_TRACT | Status: AC
  Administered 2023-05-11 (×3): 3 mL via RESPIRATORY_TRACT
  Filled 2023-05-11: qty 3
  Filled 2023-05-11: qty 6

## 2023-05-11 MED ORDER — ALBUTEROL SULFATE HFA 108 (90 BASE) MCG/ACT IN AERS
2.0000 | INHALATION_SPRAY | Freq: Once | RESPIRATORY_TRACT | Status: AC
  Administered 2023-05-11: 2 via RESPIRATORY_TRACT
  Filled 2023-05-11: qty 6.7

## 2023-05-11 MED ORDER — PREDNISONE 20 MG PO TABS
40.0000 mg | ORAL_TABLET | Freq: Every day | ORAL | 0 refills | Status: DC
  Filled 2023-05-11: qty 8, 4d supply, fill #0

## 2023-05-11 NOTE — Discharge Instructions (Signed)
Use your inhaler every 4 hours(6 puffs) while awake, return for sudden worsening shortness of breath, or if you need to use your inhaler more often.   This would be the max dosing for the inhaler.  If you take it this often it will make your heart race.

## 2023-05-11 NOTE — ED Provider Notes (Signed)
[place] EMERGENCY DEPARTMENT AT [facility] Provider Note   CSN: [id number] Arrival date & time: 05/11/23  1159     History  Chief Complaint  Patient presents with   Shortness of Breath    Jason Wong is a 68 y.o. male.  68 yo M with a chief complaint of difficulty breathing.  He says it feels like when he has a COPD exacerbation.  Going on for weeks.  Got worse today.  Came to the ED for evaluation.  Has been coughing a bit.  No fevers.   Shortness of Breath      Home Medications Prior to Admission medications   Medication Sig Start Date End Date Taking? Authorizing Provider  predniSONE (DELTASONE) 20 MG tablet 2 tabs po daily x 4 days 05/11/23  Yes Melene Plan, DO  acetaminophen (TYLENOL) 500 MG tablet Take 500 mg by mouth every 6 (six) hours as needed for moderate pain.    [provider]  acetaminophen (TYLENOL) 500 MG tablet Take 500 mg by mouth as needed for mild pain (pain score 1-3) or headache.    [provider]  albuterol (PROVENTIL) (2.5 MG/3ML) 0.083% nebulizer solution Take 3 mLs (2.5 mg total) by nebulization every 6 (six) hours as needed for wheezing or shortness of breath. 04/08/23   Eber Hong, MD  albuterol (VENTOLIN HFA) 108 (90 Base) MCG/ACT inhaler Inhale 2 puffs into the lungs every 4 (four) hours as needed for wheezing or shortness of breath. 04/08/23   Eber Hong, MD  amLODipine (NORVASC) 5 MG tablet Take 5 mg by mouth daily.    [provider]  amLODipine (NORVASC) 5 MG tablet Take 1 tablet (5 mg total) by mouth daily. 06/27/22   Claiborne Rigg, NP  amLODipine (NORVASC) 5 MG tablet Take 1 tablet (5 mg total) by mouth daily. 11/17/22   Roemhildt, Lorin T, PA-C  atorvastatin (LIPITOR) 10 MG tablet Take 10 mg by mouth daily.    [provider]  atorvastatin (LIPITOR) 10 MG tablet Take 1 tablet (10 mg total) by mouth daily. 06/27/22   Claiborne Rigg, NP  famotidine (PEPCID) 20 MG tablet Take 1 tablet (20 mg  total) by mouth 2 (two) times daily. 03/29/23   Marcine Matar, MD  fluticasone furoate-vilanterol (BREO ELLIPTA) 200-25 MCG/ACT AEPB Inhale 1 puff into the lungs daily. 02/19/23   Dezii, Alexandra, DO  ipratropium-albuterol (DUONEB) 0.5-2.5 (3) MG/3ML SOLN Take 3 mLs by nebulization every 6 (six) hours as needed. 11/17/22   Roemhildt, Lorin T, PA-C  mirtazapine (REMERON) 7.5 MG tablet Take 1 tablet (7.5 mg total) by mouth at bedtime. 01/31/23 03/03/23  Morene Crocker, MD  oxyCODONE (OXY IR/ROXICODONE) 5 MG immediate release tablet Take 1 tablet (5 mg total) by mouth every 6 (six) hours as needed for severe pain (pain score 7-10). 01/17/23   Renne Crigler, PA-C  pantoprazole (PROTONIX) 40 MG tablet Take 1 tablet (40 mg total) by mouth daily. 03/29/23   Marcine Matar, MD  sucralfate (CARAFATE) 1 g tablet Take 1 tablet (1 g total) by mouth 4 (four) times daily -  with meals and at bedtime. 01/17/23   Renne Crigler, PA-C  thiamine (VITAMIN B-1) 100 MG tablet Take 1 tablet (100 mg total) by mouth daily. 06/24/22   Joseph Art, DO      Allergies    Tobacco    Review of Systems   Review of Systems  Respiratory:  Positive for shortness of breath.  Physical Exam Updated Vital Signs BP 123/85   Pulse 85   Temp 98.6 F (37 C)   Resp (!) 21   SpO2 95%  Physical Exam Vitals and nursing note reviewed.  Constitutional:      Appearance: He is well-developed.  HENT:     Head: Normocephalic and atraumatic.  Eyes:     Pupils: Pupils are equal, round, and reactive to light.  Neck:     Vascular: No JVD.  Cardiovascular:     Rate and Rhythm: Normal rate and regular rhythm.     Heart sounds: No murmur heard.    No friction rub. No gallop.  Pulmonary:     Effort: No respiratory distress.     Breath sounds: Wheezing present.     Comments: Fields with prolonged expiratory efforts and end expiratory wheezes Abdominal:     General: There is no distension.     Tenderness: There is  no abdominal tenderness. There is no guarding or rebound.  Musculoskeletal:        General: Normal range of motion.     Cervical back: Normal range of motion and neck supple.  Skin:    Coloration: Skin is not pale.     Findings: No rash.  Neurological:     Mental Status: He is alert and oriented to person, place, and time.  Psychiatric:        Behavior: Behavior normal.     ED Results / Procedures / Treatments   Labs (all labs ordered are listed, but only abnormal results are displayed) Labs Reviewed  COMPREHENSIVE METABOLIC PANEL - Abnormal; Notable for the following components:      Result Value   CO2 20 (*)    Calcium 10.4 (*)    Anion gap 17 (*)    All other components within normal limits  CBC WITH DIFFERENTIAL/PLATELET - Abnormal; Notable for the following components:   Eosinophils Absolute 0.6 (*)    All other components within normal limits  RESP PANEL BY RT-PCR (RSV, FLU A&B, COVID)  [id number]    EKG EKG Interpretation Date/Time:  Thursday May 11 2023 12:27:03 EST Ventricular Rate:  85 PR Interval:  136 QRS Duration:  68 QT Interval:  366 QTC Calculation: 435 R Axis:   80  Text Interpretation: Normal sinus rhythm Biatrial enlargement Abnormal ECG No significant change since last tracing Confirmed by Melene Plan [phone]) on 05/11/2023 4:02:28 PM  Radiology DG Chest 2 View Result Date: 05/11/2023 CLINICAL DATA:  Shortness of breath.  COPD. EXAM: CHEST - 2 VIEW COMPARISON:  Chest radiograph dated 04/08/2023. FINDINGS: Background of emphysema. No focal consolidation, pleural effusion, or pneumothorax. The cardiac silhouette is within normal limits. No acute osseous pathology. IMPRESSION: 1. No active cardiopulmonary disease. 2. Emphysema. Electronically Signed   By: Elgie Collard M.D.   On: 05/11/2023 13:14    Procedures Procedures    Medications Ordered in ED Medications  albuterol (VENTOLIN HFA) 108 (90 Base) MCG/ACT inhaler 2 puff (has no administration  in time range)  ipratropium-albuterol (DUONEB) 0.5-2.5 (3) MG/3ML nebulizer solution 3 mL (3 mLs Nebulization Given 05/11/23 1830)  predniSONE (DELTASONE) tablet 60 mg (60 mg Oral Given 05/11/23 1822)    ED Course/ Medical Decision Making/ A&P                                 Medical Decision Making Amount and/or Complexity of Data Reviewed Labs: ordered. Radiology: ordered.  Risk Prescription drug management.   68 yo M difficulty breathing.  Patient is concerned he is having a COPD exacerbation.  Given 3 DuoNebs back-to-back and prednisone and reassess.   Chest x-ray independent interpreted by me without focal obstructive pneumothorax.  No anemia, no leukocytosis, COVID flu and RSV are negative.  On repeat examination the patient is feeling quite a bit better.  Better aeration on repeat lung exam.  Will discharge home.  Burst dose of steroids.  PCP follow-up.  7:55 PM:  I have discussed the diagnosis/risks/treatment options with the patient.  Evaluation and diagnostic testing in the emergency department does not suggest an emergent condition requiring admission or immediate intervention beyond what has been performed at this time.  They will follow up with PCP. We also discussed returning to the ED immediately if new or worsening sx occur. We discussed the sx which are most concerning (e.g., sudden worsening pain, fever, inability to tolerate by mouth, need to use inhaler more often than every 4 hours) that necessitate immediate return. Medications administered to the patient during their visit and any new prescriptions provided to the patient are listed below.  Medications given during this visit Medications  albuterol (VENTOLIN HFA) 108 (90 Base) MCG/ACT inhaler 2 puff (has no administration in time range)  ipratropium-albuterol (DUONEB) 0.5-2.5 (3) MG/3ML nebulizer solution 3 mL (3 mLs Nebulization Given 05/11/23 1830)  predniSONE (DELTASONE) tablet 60 mg (60 mg Oral Given 05/11/23 1822)      The patient appears reasonably screen and/or stabilized for discharge and I doubt any other medical condition or other [facility] requiring further screening, evaluation, or treatment in the ED at this time prior to discharge.          Final Clinical Impression(s) / ED Diagnoses Final diagnoses:  COPD exacerbation (HCC)    Rx / DC Orders ED Discharge Orders          Ordered    predniSONE (DELTASONE) 20 MG tablet        05/11/23 1954              Melene Plan, DO 05/11/23 1955

## 2023-05-11 NOTE — ED Triage Notes (Signed)
Pt reports SHOB "for a while." Denies leg swelling and fevers.

## 2023-05-11 NOTE — ED Notes (Signed)
Patient given sandwich bag and gingerale

## 2023-05-12 ENCOUNTER — Other Ambulatory Visit: Payer: Self-pay

## 2023-05-13 ENCOUNTER — Emergency Department (HOSPITAL_COMMUNITY): Payer: Medicare Other

## 2023-05-13 ENCOUNTER — Other Ambulatory Visit: Payer: Self-pay

## 2023-05-13 ENCOUNTER — Observation Stay (HOSPITAL_COMMUNITY): Payer: Medicare Other

## 2023-05-13 ENCOUNTER — Encounter (HOSPITAL_COMMUNITY): Payer: Self-pay

## 2023-05-13 ENCOUNTER — Inpatient Hospital Stay (HOSPITAL_COMMUNITY)
Admission: EM | Admit: 2023-05-13 | Discharge: 2023-05-17 | DRG: 193 | Disposition: A | Discharge status: Home Health | Payer: Medicare Other | Attending: Internal Medicine | Admitting: Internal Medicine

## 2023-05-13 DIAGNOSIS — Z7951 Long term (current) use of inhaled steroids: Secondary | ICD-10-CM

## 2023-05-13 DIAGNOSIS — R338 Other retention of urine: Secondary | ICD-10-CM | POA: Diagnosis present

## 2023-05-13 DIAGNOSIS — J101 Influenza due to other identified influenza virus with other respiratory manifestations: Principal | ICD-10-CM | POA: Diagnosis present

## 2023-05-13 DIAGNOSIS — F17200 Nicotine dependence, unspecified, uncomplicated: Secondary | ICD-10-CM | POA: Diagnosis present

## 2023-05-13 DIAGNOSIS — J9621 Acute and chronic respiratory failure with hypoxia: Secondary | ICD-10-CM

## 2023-05-13 DIAGNOSIS — R0602 Shortness of breath: Secondary | ICD-10-CM | POA: Diagnosis not present

## 2023-05-13 DIAGNOSIS — T380X5A Adverse effect of glucocorticoids and synthetic analogues, initial encounter: Secondary | ICD-10-CM | POA: Diagnosis not present

## 2023-05-13 DIAGNOSIS — R739 Hyperglycemia, unspecified: Secondary | ICD-10-CM | POA: Diagnosis not present

## 2023-05-13 DIAGNOSIS — R319 Hematuria, unspecified: Secondary | ICD-10-CM | POA: Insufficient documentation

## 2023-05-13 DIAGNOSIS — E43 Unspecified severe protein-calorie malnutrition: Secondary | ICD-10-CM | POA: Diagnosis present

## 2023-05-13 DIAGNOSIS — Z79899 Other long term (current) drug therapy: Secondary | ICD-10-CM

## 2023-05-13 DIAGNOSIS — Z9981 Dependence on supplemental oxygen: Secondary | ICD-10-CM

## 2023-05-13 DIAGNOSIS — D7589 Other specified diseases of blood and blood-forming organs: Secondary | ICD-10-CM | POA: Diagnosis present

## 2023-05-13 DIAGNOSIS — R310 Gross hematuria: Secondary | ICD-10-CM | POA: Diagnosis not present

## 2023-05-13 DIAGNOSIS — J441 Chronic obstructive pulmonary disease with (acute) exacerbation: Principal | ICD-10-CM | POA: Diagnosis present

## 2023-05-13 DIAGNOSIS — N3090 Cystitis, unspecified without hematuria: Secondary | ICD-10-CM | POA: Insufficient documentation

## 2023-05-13 DIAGNOSIS — R062 Wheezing: Secondary | ICD-10-CM | POA: Diagnosis not present

## 2023-05-13 DIAGNOSIS — R0689 Other abnormalities of breathing: Secondary | ICD-10-CM | POA: Diagnosis not present

## 2023-05-13 DIAGNOSIS — J9601 Acute respiratory failure with hypoxia: Secondary | ICD-10-CM | POA: Diagnosis present

## 2023-05-13 DIAGNOSIS — Z8249 Family history of ischemic heart disease and other diseases of the circulatory system: Secondary | ICD-10-CM

## 2023-05-13 DIAGNOSIS — R404 Transient alteration of awareness: Secondary | ICD-10-CM | POA: Diagnosis not present

## 2023-05-13 DIAGNOSIS — Z91128 Patient's intentional underdosing of medication regimen for other reason: Secondary | ICD-10-CM

## 2023-05-13 DIAGNOSIS — Z7952 Long term (current) use of systemic steroids: Secondary | ICD-10-CM

## 2023-05-13 DIAGNOSIS — I499 Cardiac arrhythmia, unspecified: Secondary | ICD-10-CM | POA: Diagnosis not present

## 2023-05-13 DIAGNOSIS — Z8659 Personal history of other mental and behavioral disorders: Secondary | ICD-10-CM

## 2023-05-13 DIAGNOSIS — E46 Unspecified protein-calorie malnutrition: Secondary | ICD-10-CM | POA: Insufficient documentation

## 2023-05-13 DIAGNOSIS — E872 Acidosis, unspecified: Secondary | ICD-10-CM | POA: Diagnosis present

## 2023-05-13 DIAGNOSIS — F1010 Alcohol abuse, uncomplicated: Secondary | ICD-10-CM | POA: Diagnosis present

## 2023-05-13 DIAGNOSIS — Z743 Need for continuous supervision: Secondary | ICD-10-CM | POA: Diagnosis not present

## 2023-05-13 DIAGNOSIS — I10 Essential (primary) hypertension: Secondary | ICD-10-CM | POA: Diagnosis present

## 2023-05-13 DIAGNOSIS — R059 Cough, unspecified: Secondary | ICD-10-CM | POA: Diagnosis not present

## 2023-05-13 DIAGNOSIS — N401 Enlarged prostate with lower urinary tract symptoms: Secondary | ICD-10-CM | POA: Insufficient documentation

## 2023-05-13 DIAGNOSIS — Z681 Body mass index (BMI) 19 or less, adult: Secondary | ICD-10-CM

## 2023-05-13 DIAGNOSIS — R918 Other nonspecific abnormal finding of lung field: Secondary | ICD-10-CM | POA: Diagnosis not present

## 2023-05-13 DIAGNOSIS — K219 Gastro-esophageal reflux disease without esophagitis: Secondary | ICD-10-CM | POA: Diagnosis present

## 2023-05-13 DIAGNOSIS — T380X6A Underdosing of glucocorticoids and synthetic analogues, initial encounter: Secondary | ICD-10-CM | POA: Diagnosis present

## 2023-05-13 DIAGNOSIS — Z720 Tobacco use: Secondary | ICD-10-CM | POA: Diagnosis present

## 2023-05-13 DIAGNOSIS — Z1152 Encounter for screening for COVID-19: Secondary | ICD-10-CM

## 2023-05-13 DIAGNOSIS — R109 Unspecified abdominal pain: Secondary | ICD-10-CM | POA: Diagnosis not present

## 2023-05-13 DIAGNOSIS — E785 Hyperlipidemia, unspecified: Secondary | ICD-10-CM | POA: Diagnosis present

## 2023-05-13 DIAGNOSIS — N3091 Cystitis, unspecified with hematuria: Secondary | ICD-10-CM | POA: Diagnosis present

## 2023-05-13 LAB — I-STAT CG4 LACTIC ACID, ED: Lactic Acid, Venous: 3.1 mmol/L (ref 0.5–1.9)

## 2023-05-13 LAB — CBC WITH DIFFERENTIAL/PLATELET
Abs Immature Granulocytes: 0.01 10*3/uL (ref 0.00–0.07)
Basophils Absolute: 0 10*3/uL (ref 0.0–0.1)
Basophils Relative: 0 %
Eosinophils Absolute: 0 10*3/uL (ref 0.0–0.5)
Eosinophils Relative: 0 %
HCT: 42.4 % (ref 39.0–52.0)
Hemoglobin: 13.9 g/dL (ref 13.0–17.0)
Immature Granulocytes: 0 %
Lymphocytes Relative: 14 %
Lymphs Abs: 0.5 10*3/uL — ABNORMAL LOW (ref 0.7–4.0)
MCH: 33.1 pg (ref 26.0–34.0)
MCHC: 32.8 g/dL (ref 30.0–36.0)
MCV: 101 fL — ABNORMAL HIGH (ref 80.0–100.0)
Monocytes Absolute: 0.3 10*3/uL (ref 0.1–1.0)
Monocytes Relative: 7 %
Neutro Abs: 3.1 10*3/uL (ref 1.7–7.7)
Neutrophils Relative %: 79 %
Platelets: 301 10*3/uL (ref 150–400)
RBC: 4.2 MIL/uL — ABNORMAL LOW (ref 4.22–5.81)
RDW: 14 % (ref 11.5–15.5)
WBC: 3.9 10*3/uL — ABNORMAL LOW (ref 4.0–10.5)
nRBC: 0 % (ref 0.0–0.2)

## 2023-05-13 LAB — BLOOD GAS, VENOUS
Acid-base deficit: 2 mmol/L (ref 0.0–2.0)
Bicarbonate: 22.3 mmol/L (ref 20.0–28.0)
O2 Saturation: 86.8 %
Patient temperature: 37
pCO2, Ven: 36 mm[Hg] — ABNORMAL LOW (ref 44–60)
pH, Ven: 7.4 (ref 7.25–7.43)
pO2, Ven: 55 mm[Hg] — ABNORMAL HIGH (ref 32–45)

## 2023-05-13 LAB — BASIC METABOLIC PANEL
Anion gap: 13 (ref 5–15)
BUN: 17 mg/dL (ref 8–23)
CO2: 20 mmol/L — ABNORMAL LOW (ref 22–32)
Calcium: 9.7 mg/dL (ref 8.9–10.3)
Chloride: 109 mmol/L (ref 98–111)
Creatinine, Ser: 1.08 mg/dL (ref 0.61–1.24)
GFR, Estimated: >60 mL/min (ref >60)
Glucose, Bld: 130 mg/dL — ABNORMAL HIGH (ref 70–99)
Potassium: 4.1 mmol/L (ref 3.5–5.1)
Sodium: 142 mmol/L (ref 135–145)

## 2023-05-13 LAB — RESP PANEL BY RT-PCR (RSV, FLU A&B, COVID)  RVPGX2
Influenza A by PCR: POSITIVE — AB
Influenza B by PCR: NEGATIVE
Resp Syncytial Virus by PCR: NEGATIVE
SARS Coronavirus 2 by RT PCR: NEGATIVE

## 2023-05-13 MED ORDER — THIAMINE HCL 100 MG/ML IJ SOLN
100.0000 mg | Freq: Every day | INTRAMUSCULAR | Status: DC
  Administered 2023-05-15: 100 mg via INTRAVENOUS
  Filled 2023-05-13 (×2): qty 2

## 2023-05-13 MED ORDER — METHYLPREDNISOLONE SODIUM SUCC 125 MG IJ SOLR
125.0000 mg | Freq: Once | INTRAMUSCULAR | Status: AC
  Administered 2023-05-13: 125 mg via INTRAVENOUS
  Filled 2023-05-13: qty 2

## 2023-05-13 MED ORDER — MAGNESIUM SULFATE 2 GM/50ML IV SOLN
2.0000 g | Freq: Once | INTRAVENOUS | Status: AC
  Administered 2023-05-13: 2 g via INTRAVENOUS
  Filled 2023-05-13: qty 50

## 2023-05-13 MED ORDER — ENOXAPARIN SODIUM 40 MG/0.4ML IJ SOSY
40.0000 mg | PREFILLED_SYRINGE | INTRAMUSCULAR | Status: DC
  Administered 2023-05-13: 40 mg via SUBCUTANEOUS
  Filled 2023-05-13: qty 0.4

## 2023-05-13 MED ORDER — ARFORMOTEROL TARTRATE 15 MCG/2ML IN NEBU
15.0000 ug | INHALATION_SOLUTION | Freq: Two times a day (BID) | RESPIRATORY_TRACT | Status: DC
  Administered 2023-05-13 – 2023-05-17 (×8): 15 ug via RESPIRATORY_TRACT
  Filled 2023-05-13 (×8): qty 2

## 2023-05-13 MED ORDER — PREDNISONE 20 MG PO TABS
40.0000 mg | ORAL_TABLET | Freq: Every day | ORAL | Status: DC
  Administered 2023-05-14: 40 mg via ORAL
  Filled 2023-05-13: qty 2

## 2023-05-13 MED ORDER — IPRATROPIUM-ALBUTEROL 0.5-2.5 (3) MG/3ML IN SOLN
3.0000 mL | Freq: Once | RESPIRATORY_TRACT | Status: AC
  Administered 2023-05-13: 3 mL via RESPIRATORY_TRACT
  Filled 2023-05-13: qty 3

## 2023-05-13 MED ORDER — GUAIFENESIN ER 600 MG PO TB12
600.0000 mg | ORAL_TABLET | Freq: Two times a day (BID) | ORAL | Status: DC
  Administered 2023-05-13 – 2023-05-17 (×6): 600 mg via ORAL
  Filled 2023-05-13 (×6): qty 1

## 2023-05-13 MED ORDER — IPRATROPIUM-ALBUTEROL 0.5-2.5 (3) MG/3ML IN SOLN
3.0000 mL | Freq: Four times a day (QID) | RESPIRATORY_TRACT | Status: DC
  Administered 2023-05-14: 3 mL via RESPIRATORY_TRACT
  Filled 2023-05-13 (×2): qty 3

## 2023-05-13 MED ORDER — SODIUM CHLORIDE 0.9 % IV BOLUS
1000.0000 mL | Freq: Once | INTRAVENOUS | Status: AC
  Administered 2023-05-14: 1000 mL via INTRAVENOUS

## 2023-05-13 MED ORDER — ADULT MULTIVITAMIN W/MINERALS CH
1.0000 | ORAL_TABLET | Freq: Every day | ORAL | Status: DC
  Administered 2023-05-13 – 2023-05-17 (×4): 1 via ORAL
  Filled 2023-05-13 (×4): qty 1

## 2023-05-13 MED ORDER — LORAZEPAM 2 MG/ML IJ SOLN
1.0000 mg | INTRAMUSCULAR | Status: AC | PRN
  Administered 2023-05-16: 1 mg via INTRAVENOUS
  Filled 2023-05-13: qty 1

## 2023-05-13 MED ORDER — THIAMINE MONONITRATE 100 MG PO TABS
100.0000 mg | ORAL_TABLET | Freq: Every day | ORAL | Status: DC
  Administered 2023-05-13 – 2023-05-17 (×4): 100 mg via ORAL
  Filled 2023-05-13 (×4): qty 1

## 2023-05-13 MED ORDER — ACETAMINOPHEN 500 MG PO TABS
1000.0000 mg | ORAL_TABLET | Freq: Once | ORAL | Status: AC
  Administered 2023-05-13: 1000 mg via ORAL
  Filled 2023-05-13: qty 2

## 2023-05-13 MED ORDER — ALBUTEROL SULFATE (2.5 MG/3ML) 0.083% IN NEBU
2.5000 mg | INHALATION_SOLUTION | Freq: Once | RESPIRATORY_TRACT | Status: AC
  Administered 2023-05-13: 2.5 mg via RESPIRATORY_TRACT
  Filled 2023-05-13: qty 3

## 2023-05-13 MED ORDER — OSELTAMIVIR PHOSPHATE 30 MG PO CAPS
30.0000 mg | ORAL_CAPSULE | Freq: Two times a day (BID) | ORAL | Status: DC
  Administered 2023-05-14: 30 mg via ORAL
  Filled 2023-05-13: qty 1

## 2023-05-13 MED ORDER — IPRATROPIUM-ALBUTEROL 0.5-2.5 (3) MG/3ML IN SOLN
RESPIRATORY_TRACT | Status: AC
  Administered 2023-05-13: 3 mL via RESPIRATORY_TRACT
  Filled 2023-05-13: qty 3

## 2023-05-13 MED ORDER — LORAZEPAM 1 MG PO TABS
1.0000 mg | ORAL_TABLET | ORAL | Status: AC | PRN
  Administered 2023-05-13 – 2023-05-14 (×2): 2 mg via ORAL
  Filled 2023-05-13 (×2): qty 2

## 2023-05-13 MED ORDER — BUDESONIDE 0.25 MG/2ML IN SUSP
0.2500 mg | Freq: Two times a day (BID) | RESPIRATORY_TRACT | Status: DC
  Administered 2023-05-13 – 2023-05-17 (×8): 0.25 mg via RESPIRATORY_TRACT
  Filled 2023-05-13 (×8): qty 2

## 2023-05-13 MED ORDER — SODIUM CHLORIDE 0.9% FLUSH
3.0000 mL | Freq: Two times a day (BID) | INTRAVENOUS | Status: DC
  Administered 2023-05-13 – 2023-05-17 (×8): 3 mL via INTRAVENOUS

## 2023-05-13 MED ORDER — REVEFENACIN 175 MCG/3ML IN SOLN
175.0000 ug | Freq: Every day | RESPIRATORY_TRACT | Status: DC
  Administered 2023-05-13 – 2023-05-17 (×4): 175 ug via RESPIRATORY_TRACT
  Filled 2023-05-13 (×4): qty 3

## 2023-05-13 MED ORDER — SODIUM CHLORIDE 0.9 % IV BOLUS
1000.0000 mL | Freq: Once | INTRAVENOUS | Status: AC
  Administered 2023-05-13: 1000 mL via INTRAVENOUS

## 2023-05-13 MED ORDER — SODIUM CHLORIDE 0.9 % IV SOLN
1.0000 g | Freq: Once | INTRAVENOUS | Status: AC
  Administered 2023-05-13: 1 g via INTRAVENOUS
  Filled 2023-05-13: qty 10

## 2023-05-13 MED ORDER — FOLIC ACID 1 MG PO TABS
1.0000 mg | ORAL_TABLET | Freq: Every day | ORAL | Status: DC
  Administered 2023-05-13 – 2023-05-14 (×2): 1 mg via ORAL
  Filled 2023-05-13 (×2): qty 1

## 2023-05-13 NOTE — ED Triage Notes (Signed)
 [facility] reports pt coming from home c/o sob since last night. Pt had 1 duo neb before EMS arrival.

## 2023-05-13 NOTE — ED Notes (Signed)
 [facility] called.

## 2023-05-13 NOTE — H&P (Incomplete)
 Date: 05/13/2023               Patient Name:  Jason Wong MRN: [id number]  DOB: [birth year] Age / Sex: 68 y.o., male   PCP: Jason Matar, MD         Medical Service: Internal Medicine Teaching Service         Attending Physician: Dr. Reymundo Poll, MD      First Contact: Dr. Lovie Macadamia MD Pager [phone]    Second Contact: Dr. Champ Mungo, DO Pager [phone]         After Hours (After 5p/  First Contact Pager: [phone]  weekends / holidays): Second Contact Pager: [phone]   SUBJECTIVE   Chief Complaint: shortness of breath  History of Present Illness: Jason Wong is a 68 yo male with a PMH significant for uncontrolled COPD, current tobacco use,  and recurrent ED visits for exacerbations of same who presented to the [facility] with shortness of breath  Patient reports that has been experiencing worsening cough for the past few weeks since his last ED visit in January of 2025. However, he felt worse about two days ago, which prompted him to come to the [facility]. At that time he was diagnosed with COPD exacerbation, managed with ED nebulized treatments, steroids, and discharged to continue outpatient management. However, patient felt worse, with increased cough, sputum production, and shortness of breath associated with wheezing. Of note, this patient has been out of his inhaled triple combination COPD inhalers since December of last year.   Denies chest pain, nausea, vomiting, orthopnea, or PND. Patient endorses decreased appetite and oral intake. No changes in his bowel or urinary habits. No recent travel or prolonged immobilization. No extremity pain or swelling.  ED Course: Afebrile, tachypneic with oxygen saturations >94% on RA.. Macrocytosis on CBC without leukocytosis. Influenza A positive. No focal consolidations on CXR. IMTS paged for admission after wheezing failed to improve with multiple rounds of duonebs, magnesium IV, and Iv steroids.   Medications:  Current Outpatient  Medications  Medication Instructions  . acetaminophen (TYLENOL) 500 mg, Oral, Every 6 hours PRN  . acetaminophen (TYLENOL) 500 mg, Oral, As needed  . albuterol (PROVENTIL) 2.5 mg, Nebulization, Every 6 hours PRN  . albuterol (VENTOLIN HFA) 108 (90 Base) MCG/ACT inhaler 2 puffs, Inhalation, Every 4 hours PRN  . amLODipine (NORVASC) 5 mg, Oral, Daily  . amLODipine (NORVASC) 5 mg, Oral, Daily  . amLODipine (NORVASC) 5 mg, Oral, Daily  . atorvastatin (LIPITOR) 10 mg, Oral, Daily  . atorvastatin (LIPITOR) 10 mg, Oral, Daily  . famotidine (PEPCID) 20 mg, Oral, 2 times daily  . fluticasone furoate-vilanterol (BREO ELLIPTA) 200-25 MCG/ACT AEPB 1 puff, Inhalation, Daily  . ipratropium-albuterol (DUONEB) 0.5-2.5 (3) MG/3ML SOLN 3 mLs, Nebulization, Every 6 hours PRN  . mirtazapine (REMERON) 7.5 mg, Oral, Daily at bedtime  . oxyCODONE (OXY IR/ROXICODONE) 5 mg, Oral, Every 6 hours PRN  . pantoprazole (PROTONIX) 40 mg, Oral, Daily  . predniSONE (DELTASONE) 40 mg, Oral, Daily  . sucralfate (CARAFATE) 1 g, Oral, 3 times daily with meals & bedtime  . thiamine (VITAMIN B-1) 100 mg, Oral, Daily    Past Medical History Past Medical History:  Diagnosis Date  . Asthma   . COPD (chronic obstructive pulmonary disease) (HCC)   . Hepatitis C antibody positive in blood   . HLD (hyperlipidemia)   . HTN (hypertension)   . On home oxygen therapy    per pt 08-31-2021 uses 02 about every other day  Past Surgical History Past Surgical History:  Procedure Laterality Date  . INCISE AND DRAIN ABCESS     dog bite    Social:  Lives With: with his nephrew here in [place] Occupation: Retired, former Estate agent Support: Family and friends Level of Function: independent with ADLs and iADLs PCP: Jason Wong at [facility] Substances: s 2 beers/day 2 shots/day, no illegal drugs use. Last drink was yesterday.   Family History:  Family History  Problem Relation Age of Onset   . Hypertension Mother   . Breast cancer Sister   . Hypertension Sister   . Colon cancer Brother   . Colon polyps Neg Hx   . Esophageal cancer Neg Hx   . Rectal cancer Neg Hx   . Stomach cancer Neg Hx     Allergies: Allergies as of 05/13/2023 - Review Complete 05/13/2023  Allergen Reaction Noted  . Tobacco Shortness Of Breath and Other (See Comments) 04/30/2021    Review of Systems: A complete ROS was negative except as per HPI.   OBJECTIVE:   Physical Exam: Blood pressure 112/70, pulse 87, temperature 98.4 F (36.9 C), temperature source Oral, resp. rate (!) 22, SpO2 98%.  Constitutional: cachectic, ill-appearing man laying in bed, in mild acute distress HENT: Mucous membranes moist Eyes: conjunctiva non-erythematous Neck: supple. No JVP Cardiovascular: tachycardia, no m/r/g Pulmonary/Chest: increased work of breathing, unable to speak in full sentences. Clear to auscultation bilaterally with referred upper airway sounds. Inspiratory and expiratory wheezing throughout. No crackles. 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 Neurological: alert & oriented x 3, 5/5 strength in bilateral upper and lower extremities, gait exam deferred Skin: warm and dry, decreased skin turgor Psych: Pleasant mood and affect  Labs: CBC    Component Value Date/Time   WBC 3.9 (L) 05/13/2023 1455   RBC 4.20 (L) 05/13/2023 1455   HGB 13.9 05/13/2023 1455   HGB 17.2 09/06/2019 1053   HCT 42.4 05/13/2023 1455   HCT 49.9 09/06/2019 1053   PLT 301 05/13/2023 1455   PLT 274 09/06/2019 1053   MCV 101.0 (H) 05/13/2023 1455   MCV 98 (H) 09/06/2019 1053   MCH 33.1 05/13/2023 1455   MCHC 32.8 05/13/2023 1455   RDW 14.0 05/13/2023 1455   RDW 11.5 (L) 09/06/2019 1053   LYMPHSABS 0.5 (L) 05/13/2023 1455   LYMPHSABS 2.5 06/13/2016 1110   MONOABS 0.3 05/13/2023 1455   EOSABS 0.0 05/13/2023 1455   EOSABS 0.1 06/13/2016 1110   BASOSABS 0.0 05/13/2023  1455   BASOSABS 0.0 06/13/2016 1110     CMP     Component Value Date/Time   NA 142 05/13/2023 1455   NA 144 05/09/2022 1004   K 4.1 05/13/2023 1455   CL 109 05/13/2023 1455   CO2 20 (L) 05/13/2023 1455   GLUCOSE 130 (H) 05/13/2023 1455   BUN 17 05/13/2023 1455   BUN 9 05/09/2022 1004   CREATININE 1.08 05/13/2023 1455   CREATININE 1.18 10/03/2017 1037   CALCIUM 9.7 05/13/2023 1455   PROT 7.6 05/11/2023 1232   PROT 8.0 05/09/2022 1004   ALBUMIN 4.4 05/11/2023 1232   ALBUMIN 5.0 (H) 05/09/2022 1004   AST 23 05/11/2023 1232   ALT 13 05/11/2023 1232   ALKPHOS 74 05/11/2023 1232   BILITOT 1.2 05/11/2023 1232   BILITOT 0.7 05/09/2022 1004   GFRNONAA >60 05/13/2023 1455   GFRAA 66 01/07/2020 0934    Imaging: DG CHEST PORT  1 VIEW Result Date: 05/13/2023 CLINICAL DATA:  Shortness of breath EXAM: PORTABLE CHEST 1 VIEW COMPARISON:  05/13/2023 FINDINGS: Heart and mediastinal contours are within normal limits. No focal opacities or effusions. No acute bony abnormality. Previously described hazy round opacity over the left lower lung no longer visualized. IMPRESSION: No active disease. Electronically Signed   By: Charlett Nose M.D.   On: 05/13/2023 23:23   DG Chest Port 1 View Result Date: 05/13/2023 CLINICAL DATA:  Shortness of breath and cough EXAM: PORTABLE CHEST 1 VIEW COMPARISON:  Chest radiograph dated 05/11/2023 FINDINGS: Normal lung volumes. Hazy rounded opacity in the left lower lung may represent nipple shadow. No pleural effusion or pneumothorax. The heart size and mediastinal contours are within normal limits. No acute osseous abnormality. IMPRESSION: Hazy rounded opacity in the left lower lung may represent nipple shadow. Recommend repeat chest radiograph with nipple markers. Otherwise, no focal consolidations. Electronically Signed   By: Agustin Cree M.D.   On: 05/13/2023 16:02     EKG: pending  ASSESSMENT & PLAN:   Assessment & Plan by Problem: Principal Problem:   COPD with  acute exacerbation (HCC) Active Problems:   Tobacco use   Influenza A   Protein-calorie malnutrition (HCC)   Urinary retention due to benign prostatic hyperplasia   Sem Mccaughey is a 68 y.o. person living with a history of COPD, BPH,  who presented with shortness of breath and wheezing and admitted for COPD exacerbation due to influenza A on hospital day 0  COPD exacerbation Influenza A Poorly controlled COPD with non adherence to inhalers. Cardinal signs for COPD exacerbation met in setting of viral infection. No signs of peripheral congestion or pulmonary edema to suggest cardiac etiology. Currently no signs of hypoxia despite severe wheezing. Repeat CXR without over consolidation. Will manage with supportive treatment, scheduled bronchodilators, and oral steroids.  - Tamiflu 75 mg BID 2/22 - 2/27, for 10 doses - Nebulized Yulperi, Brovana, and budesonide, scheduled - Scheduled Duonebs q6HR for 24 Hrs, consider continuing PRN thereafter - S/p solumedrol in the ED; will continue prednisone 40 mg until 2/26 - Supplemental O2 therapy if hypoxic - O2 saturations with ambulation, ordered - Incentive spirometer/flutter valve - Follow up EKG - Telemetry for 24 hours  Elevated lactic acid Without metabolic acidosis iSTAT lactic acid 3.1. likely in the setting of poor oral intake for the past 4 days. Other etiologies considered acute infection, increased B2 agonist use, recent alcohol use. - Now s/p 2L IVF resuscitation - Patient remains unable to eat, will order maintenance fluids for 12hrs - Monitor lactic acid q3HR until resolution - Follow up urine toxicology studies, ethanol level  BPH Urinary retention >900 mL post void bladder scan, concerning for overstretch injury. Patient has documented LUTS of hesitancy and forced urination in the past.  - Foley catheter - Trial of void - May need BPH pharmacologic treatment before discharge  Concern for alcohol use disorder Patient drinks  4 or more drinks per day, every day; however, could not elucidate other potential pathological signs that it is a disorder for him per DSM-V criteria. No documented or subjective history of alcohol withdrawal, seizures or DT.  Given that his last drink was yesterday, will place on CIWA - CIWA without ativan  Macrocytosis without anemia Stable Hgb. Likely in the setting of chronic alcohol use. However, will check other labs below - Supplementing Folate - B12 levels   Diet: Normal VTE: Enoxaparin IVF: None,100cc/hr Code: Full  Prior to Admission Living  Arrangement: Home, living nephew Anticipated Discharge Location: Home Barriers to Discharge: Clinical improvement  Dispo: Admit patient to Observation with expected length of stay less than 2 midnights.  Signed: Morene Crocker, MD Internal Medicine Resident PGY-2 05/13/2023, 11:31 PM

## 2023-05-13 NOTE — H&P (Incomplete)
 Date: 05/13/2023               Patient Name:  Jason Wong MRN: [id number]  DOB: 1956/03/06 Age / Sex: 68 y.o., male   PCP: Marcine Matar, MD         Medical Service: Internal Medicine Teaching Service         Attending Physician: Dr. Reymundo Poll, MD      First Contact: Dr. Lovie Macadamia MD Pager [phone]    Second Contact: Dr. Champ Mungo, DO Pager [phone]         After Hours (After 5p/  First Contact Pager: [phone]  weekends / holidays): Second Contact Pager: [phone]   SUBJECTIVE   Chief Complaint: shortness of breath  History of Present Illness: Jason Wong is a 68 yo male with a PMH significant for uncontrolled COPD, current tobacco use,  and recurrent ED visits for exacerbations of same who presented to the [facility] with shortness of breath  Patient reports that has been experiencing worsening cough for the past few weeks since his last ED visit in January of 2025. However, he felt worse about two days ago, which prompted him to come to the [facility]. At that time he was diagnosed with COPD exacerbation, managed with ED nebulized treatments, steroids, and discharged to continue outpatient management. However, patient felt worse, with increased cough, sputum production, and shortness of breath associated with wheezing. Of note, this patient has been out of his inhaled triple combination COPD inhalers since December of last year.   Denies chest pain, nausea, vomiting, orthopnea, or PND. Patient endorses decreased appetite and oral intake. No changes in his bowel or urinary habits. No recent travel or prolonged immobilization. No extremity pain or swelling.  ED Course: Afebrile, tachypneic with oxygen saturations >94% on RA.. Macrocytosis on CBC without leukocytosis. Influenza A positive. No focal consolidations on CXR. IMTS paged for admission after wheezing failed to improve with multiple rounds of duonebs, magnesium IV, and Iv steroids.   Medical  history COPD Hypertension Hyperlipidemia BPH GERD Tobacco use    Medications:  Currently only taking: Amlodipine 5 mg daily Atorvastatin 10 mg daily  Other medications previously prescribed per the EMR: Current Outpatient Medications  Medication Instructions   acetaminophen (TYLENOL) 500 mg, Oral, Every 6 hours PRN   acetaminophen (TYLENOL) 500 mg, Oral, As needed   albuterol (PROVENTIL) 2.5 mg, Nebulization, Every 6 hours PRN   albuterol (VENTOLIN HFA) 108 (90 Base) MCG/ACT inhaler 2 puffs, Inhalation, Every 4 hours PRN   amLODipine (NORVASC) 5 mg, Oral, Daily   amLODipine (NORVASC) 5 mg, Oral, Daily   amLODipine (NORVASC) 5 mg, Oral, Daily   atorvastatin (LIPITOR) 10 mg, Oral, Daily   atorvastatin (LIPITOR) 10 mg, Oral, Daily   famotidine (PEPCID) 20 mg, Oral, 2 times daily   fluticasone furoate-vilanterol (BREO ELLIPTA) 200-25 MCG/ACT AEPB 1 puff, Inhalation, Daily   ipratropium-albuterol (DUONEB) 0.5-2.5 (3) MG/3ML SOLN 3 mLs, Nebulization, Every 6 hours PRN   mirtazapine (REMERON) 7.5 mg, Oral, Daily at bedtime   oxyCODONE (OXY IR/ROXICODONE) 5 mg, Oral, Every 6 hours PRN   pantoprazole (PROTONIX) 40 mg, Oral, Daily   predniSONE (DELTASONE) 40 mg, Oral, Daily   sucralfate (CARAFATE) 1 g, Oral, 3 times daily with meals & bedtime   thiamine (VITAMIN B-1) 100 mg, Oral, Daily    Past Medical History Past Medical History:  Diagnosis Date   Asthma    COPD (chronic obstructive pulmonary disease) (HCC)    Hepatitis C antibody positive  in blood    HLD (hyperlipidemia)    HTN (hypertension)    On home oxygen therapy    per pt 08-31-2021 uses 02 about every other day    Past Surgical History Past Surgical History:  Procedure Laterality Date   INCISE AND DRAIN ABCESS     dog bite    Social:  Lives With: with his nephrew here in [place] Occupation: Retired, 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  Jonah Blue at [facility] Substances: s 2 beers/day 2 shots/day, no illegal drugs use. Last drink was yesterday.   Family History:  Family History  Problem Relation Age of Onset   Hypertension Mother    Breast cancer Sister    Hypertension Sister    Colon cancer Brother    Colon polyps Neg Hx    Esophageal cancer Neg Hx    Rectal cancer Neg Hx    Stomach cancer Neg Hx     Allergies: Allergies as of 05/13/2023 - Review Complete 05/13/2023  Allergen Reaction Noted   Tobacco Shortness Of Breath and Other (See Comments) 04/30/2021    Review of Systems: A complete ROS was negative except as per HPI.   OBJECTIVE:   Physical Exam: Blood pressure 112/70, pulse 87, temperature 98.4 F (36.9 C), temperature source Oral, resp. rate (!) 22, SpO2 98%.  Constitutional: cachectic, ill-appearing man laying in bed, in mild acute distress HENT: Mucous membranes moist Eyes: conjunctiva non-erythematous Neck: supple. No JVP Cardiovascular: tachycardia, no m/r/g Pulmonary/Chest: increased work of breathing, unable to speak in full sentences. Clear to auscultation bilaterally with referred upper airway sounds. Inspiratory and expiratory wheezing throughout. No crackles. 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 Neurological: alert & oriented x 3, 5/5 strength in bilateral upper and lower extremities, gait exam deferred Skin: warm and dry, decreased skin turgor Psych: Pleasant mood and affect  Labs: CBC    Component Value Date/Time   WBC 3.9 (L) 05/13/2023 1455   RBC 4.20 (L) 05/13/2023 1455   HGB 13.9 05/13/2023 1455   HGB 17.2 09/06/2019 1053   HCT 42.4 05/13/2023 1455   HCT 49.9 09/06/2019 1053   PLT 301 05/13/2023 1455   PLT 274 09/06/2019 1053   MCV 101.0 (H) 05/13/2023 1455   MCV 98 (H) 09/06/2019 1053   MCH 33.1 05/13/2023 1455   MCHC 32.8 05/13/2023 1455   RDW 14.0 05/13/2023 1455   RDW 11.5 (L)  09/06/2019 1053   LYMPHSABS 0.5 (L) 05/13/2023 1455   LYMPHSABS 2.5 06/13/2016 1110   MONOABS 0.3 05/13/2023 1455   EOSABS 0.0 05/13/2023 1455   EOSABS 0.1 06/13/2016 1110   BASOSABS 0.0 05/13/2023 1455   BASOSABS 0.0 06/13/2016 1110     CMP     Component Value Date/Time   NA 142 05/13/2023 1455   NA 144 05/09/2022 1004   K 4.1 05/13/2023 1455   CL 109 05/13/2023 1455   CO2 20 (L) 05/13/2023 1455   GLUCOSE 130 (H) 05/13/2023 1455   BUN 17 05/13/2023 1455   BUN 9 05/09/2022 1004   CREATININE 1.08 05/13/2023 1455   CREATININE 1.18 10/03/2017 1037   CALCIUM 9.7 05/13/2023 1455   PROT 7.6 05/11/2023 1232   PROT 8.0 05/09/2022 1004   ALBUMIN 4.4 05/11/2023 1232   ALBUMIN 5.0 (H) 05/09/2022 1004   AST 23 05/11/2023 1232   ALT 13 05/11/2023 1232   ALKPHOS  74 05/11/2023 1232   BILITOT 1.2 05/11/2023 1232   BILITOT 0.7 05/09/2022 1004   GFRNONAA >60 05/13/2023 1455   GFRAA 66 01/07/2020 0934    Imaging: DG CHEST PORT 1 VIEW Result Date: 05/13/2023 CLINICAL DATA:  Shortness of breath EXAM: PORTABLE CHEST 1 VIEW COMPARISON:  05/13/2023 FINDINGS: Heart and mediastinal contours are within normal limits. No focal opacities or effusions. No acute bony abnormality. Previously described hazy round opacity over the left lower lung no longer visualized. IMPRESSION: No active disease. Electronically Signed   By: Charlett Nose M.D.   On: 05/13/2023 23:23   DG Chest Port 1 View Result Date: 05/13/2023 CLINICAL DATA:  Shortness of breath and cough EXAM: PORTABLE CHEST 1 VIEW COMPARISON:  Chest radiograph dated 05/11/2023 FINDINGS: Normal lung volumes. Hazy rounded opacity in the left lower lung may represent nipple shadow. No pleural effusion or pneumothorax. The heart size and mediastinal contours are within normal limits. No acute osseous abnormality. IMPRESSION: Hazy rounded opacity in the left lower lung may represent nipple shadow. Recommend repeat chest radiograph with nipple markers.  Otherwise, no focal consolidations. Electronically Signed   By: Agustin Cree M.D.   On: 05/13/2023 16:02     EKG: sinus rhythm and no signs of ischemia when compared to 05/10/2022 EKG  ASSESSMENT & PLAN:   Assessment & Plan by Problem: Principal Problem:   COPD with acute exacerbation (HCC) Active Problems:   Tobacco use   Influenza A   Protein-calorie malnutrition (HCC)   Urinary retention due to benign prostatic hyperplasia   Jason Wong is a 68 y.o. person living with a history of COPD, BPH,  who presented with shortness of breath and wheezing and admitted for COPD exacerbation due to influenza A on hospital day 0  COPD exacerbation Influenza A Poorly controlled COPD with non adherence to inhalers. Cardinal signs for COPD exacerbation met in setting of viral infection. Repeat CXR without over consolidation. PE wells score of 0. No signs of peripheral congestion or pulmonary edema to suggest cardiac etiology. Currently no signs of hypoxia despite severe wheezing. Will manage with supportive treatment, scheduled bronchodilators, and oral steroids.  - Tamiflu 75 mg BID 2/22 - 2/27, for 10 doses - Nebulized Yulperi, Brovana, and budesonide, scheduled - Scheduled Duonebs q6HR for 24 Hrs, consider continuing PRN thereafter - S/p solumedrol in the ED; will continue prednisone 40 mg until 2/26 - Supplemental O2 therapy if hypoxic - O2 saturations with ambulation, ordered - Incentive spirometer/flutter valve - Follow up EKG - Telemetry for 24 hours  Elevated lactic acid Without metabolic acidosis iSTAT lactic acid 3.1. likely in the setting of poor oral intake for the past 4 days. Other etiologies considered acute infection, increased B2 agonist use, recent alcohol use. No SIRS criteria met. However, will check BNP. If elevated, inclined to check TTE to rule out low perfusion stated given chronic cocaine use. - Now s/p 2L IVF resuscitation - Patient remains unable to eat, will order  maintenance fluids for 12hrs - Monitor lactic acid q3HR until resolution - Follow up urine toxicology studies and ethanol level  BPH Urinary retention >900 mL post void bladder scan, concerning for overstretch injury. Patient has documented LUTS of hesitancy and forced urination in the past.  - Foley catheter - Trial of void - May need BPH pharmacologic treatment before discharge  Concern for alcohol use disorder Substance use Patient drinks 4 or more drinks per day, every day; however, could not elucidate other potential pathological signs that  it is a disorder for him per DSM-V criteria. No documented or subjective history of alcohol withdrawal, seizures or DT.  Given that his last drink was yesterday, will place on CIWA.  -UDS cocaine + - CIWA without ativan - Counsel cessation at discharge  Hematuria Urine Hgb and RBC this admission without history of trauma, recent instrumentation, flank pain or LUTS. No pyuria or bacteryuria or AKI. Hyaline casts on microscopy but no dismorphic RBCs. Will further evaluate with CT for potential stone vs urothelial cancer given high risk factors. - CT hematuria protocol, ordered  Macrocytosis without anemia Stable Hgb. Likely in the setting of chronic alcohol use. However, will check other labs below - Supplementing Folate - B12 levels  HTN HLD - Will hold amlodipine as his BP is stable - Continue lipitor   GERD - Continue pantoprazole  Diet: Normal VTE: Enoxaparin IVF: None,100cc/hr Code: Full  Prior to Admission Living Arrangement: Home, living nephew Anticipated Discharge Location: Home Barriers to Discharge: Clinical improvement  Dispo: Admit patient to Observation with expected length of stay less than 2 midnights.  Signed: Morene Crocker, MD Internal Medicine Resident PGY-2 05/13/2023, 11:31 PM

## 2023-05-13 NOTE — ED Provider Notes (Signed)
 [place] EMERGENCY DEPARTMENT AT [facility] Provider Note   CSN: [id number] Arrival date & time: 05/13/23  1441     History  Chief Complaint  Patient presents with   Shortness of Breath    Jason Wong is a 68 y.o. male.   Shortness of Breath 68 year old male history of hypertension, hyperlipidemia, COPD present for shortness of breath.  He has felt short of breath for some time.  Seen in the ED a couple days ago for COPD and discharged home.  However this morning breathing got worse again.  He has received 2 DuoNebs so far between 1 at home and 1 with EMS.  He is not feeling significantly better.  No chest pain.  He is at increased sputum production with phlegm.  No vomiting or diarrhea.  No fevers.     Home Medications Prior to Admission medications   Medication Sig Start Date End Date Taking? Authorizing Provider  acetaminophen (TYLENOL) 500 MG tablet Take 500 mg by mouth every 6 (six) hours as needed for moderate pain.    [provider]  acetaminophen (TYLENOL) 500 MG tablet Take 500 mg by mouth as needed for mild pain (pain score 1-3) or headache.    [provider]  albuterol (PROVENTIL) (2.5 MG/3ML) 0.083% nebulizer solution Take 3 mLs (2.5 mg total) by nebulization every 6 (six) hours as needed for wheezing or shortness of breath. 04/08/23   Eber Hong, MD  albuterol (VENTOLIN HFA) 108 (90 Base) MCG/ACT inhaler Inhale 2 puffs into the lungs every 4 (four) hours as needed for wheezing or shortness of breath. 04/08/23   Eber Hong, MD  amLODipine (NORVASC) 5 MG tablet Take 5 mg by mouth daily.    [provider]  amLODipine (NORVASC) 5 MG tablet Take 1 tablet (5 mg total) by mouth daily. 06/27/22   Claiborne Rigg, NP  amLODipine (NORVASC) 5 MG tablet Take 1 tablet (5 mg total) by mouth daily. 11/17/22   Roemhildt, Lorin T, PA-C  atorvastatin (LIPITOR) 10 MG tablet Take 10 mg by mouth daily.    [provider]  atorvastatin  (LIPITOR) 10 MG tablet Take 1 tablet (10 mg total) by mouth daily. 06/27/22   Claiborne Rigg, NP  famotidine (PEPCID) 20 MG tablet Take 1 tablet (20 mg total) by mouth 2 (two) times daily. 03/29/23   Marcine Matar, MD  fluticasone furoate-vilanterol (BREO ELLIPTA) 200-25 MCG/ACT AEPB Inhale 1 puff into the lungs daily. 02/19/23   Dezii, Alexandra, DO  ipratropium-albuterol (DUONEB) 0.5-2.5 (3) MG/3ML SOLN Take 3 mLs by nebulization every 6 (six) hours as needed. 11/17/22   Roemhildt, Lorin T, PA-C  mirtazapine (REMERON) 7.5 MG tablet Take 1 tablet (7.5 mg total) by mouth at bedtime. 01/31/23 03/03/23  Morene Crocker, MD  oxyCODONE (OXY IR/ROXICODONE) 5 MG immediate release tablet Take 1 tablet (5 mg total) by mouth every 6 (six) hours as needed for severe pain (pain score 7-10). 01/17/23   Renne Crigler, PA-C  pantoprazole (PROTONIX) 40 MG tablet Take 1 tablet (40 mg total) by mouth daily. 03/29/23   Marcine Matar, MD  predniSONE (DELTASONE) 20 MG tablet Take 2 tablets (40 mg total) by mouth daily for 4 days. 05/11/23 05/16/23  Melene Plan, DO  sucralfate (CARAFATE) 1 g tablet Take 1 tablet (1 g total) by mouth 4 (four) times daily -  with meals and at bedtime. 01/17/23   Renne Crigler, PA-C  thiamine (VITAMIN B-1) 100 MG tablet Take 1 tablet (  100 mg total) by mouth daily. 06/24/22   Joseph Art, DO      Allergies    Tobacco    Review of Systems   Review of Systems  Respiratory:  Positive for shortness of breath.   Review of systems completed and notable as per HPI.  ROS otherwise negative.   Physical Exam Updated Vital Signs BP (!) 136/90   Pulse 84   Temp 98.4 F (36.9 C) (Oral)   Resp (!) 22   SpO2 98%  Physical Exam Vitals and nursing note reviewed.  Constitutional:      General: He is not in acute distress.    Appearance: He is well-developed.  HENT:     Head: Normocephalic and atraumatic.     Mouth/Throat:     Mouth: Mucous membranes are moist.     Pharynx:  Oropharynx is clear.  Eyes:     Conjunctiva/sclera: Conjunctivae normal.  Cardiovascular:     Rate and Rhythm: Normal rate and regular rhythm.     Pulses: Normal pulses.     Heart sounds: Normal heart sounds. No murmur heard. Pulmonary:     Effort: Pulmonary effort is normal. Tachypnea present.     Breath sounds: Decreased breath sounds and wheezing present.  Abdominal:     Palpations: Abdomen is soft.     Tenderness: There is no abdominal tenderness.  Musculoskeletal:        General: No swelling.     Cervical back: Neck supple.     Right lower leg: No edema.     Left lower leg: No edema.  Skin:    General: Skin is warm and dry.     Capillary Refill: Capillary refill takes less than 2 seconds.  Neurological:     Mental Status: He is alert.  Psychiatric:        Mood and Affect: Mood normal.     ED Results / Procedures / Treatments   Labs (all labs ordered are listed, but only abnormal results are displayed) Labs Reviewed  RESP PANEL BY RT-PCR (RSV, FLU A&B, COVID)  RVPGX2 - Abnormal; Notable for the following components:      Result Value   Influenza A by PCR POSITIVE (*)    All other components within normal limits  BASIC METABOLIC PANEL - Abnormal; Notable for the following components:   CO2 20 (*)    Glucose, Bld 130 (*)    All other components within normal limits  CBC WITH DIFFERENTIAL/PLATELET - Abnormal; Notable for the following components:   WBC 3.9 (*)    RBC 4.20 (*)    MCV 101.0 (*)    Lymphs Abs 0.5 (*)    All other components within normal limits  BLOOD GAS, VENOUS - Abnormal; Notable for the following components:   pCO2, Ven 36 (*)    pO2, Ven 55 (*)    All other components within normal limits  COMPREHENSIVE METABOLIC PANEL  CBC    EKG None  Radiology DG Chest Port 1 View Result Date: 05/13/2023 CLINICAL DATA:  Shortness of breath and cough EXAM: PORTABLE CHEST 1 VIEW COMPARISON:  Chest radiograph dated 05/11/2023 FINDINGS: Normal lung  volumes. Hazy rounded opacity in the left lower lung may represent nipple shadow. No pleural effusion or pneumothorax. The heart size and mediastinal contours are within normal limits. No acute osseous abnormality. IMPRESSION: Hazy rounded opacity in the left lower lung may represent nipple shadow. Recommend repeat chest radiograph with nipple markers. Otherwise, no focal consolidations.  Electronically Signed   By: Agustin Cree M.D.   On: 05/13/2023 16:02    Procedures Procedures    Medications Ordered in ED Medications  enoxaparin (LOVENOX) injection 40 mg (has no administration in time range)  sodium chloride flush (NS) 0.9 % injection 3 mL (has no administration in time range)  thiamine (VITAMIN B1) tablet 100 mg (has no administration in time range)    Or  thiamine (VITAMIN B1) injection 100 mg (has no administration in time range)  folic acid (FOLVITE) tablet 1 mg (has no administration in time range)  multivitamin with minerals tablet 1 tablet (has no administration in time range)  ipratropium-albuterol (DUONEB) 0.5-2.5 (3) MG/3ML nebulizer solution (has no administration in time range)  sodium chloride 0.9 % bolus 1,000 mL (0 mLs Intravenous Stopped 05/13/23 1618)  magnesium sulfate IVPB 2 g 50 mL (0 g Intravenous Stopped 05/13/23 1618)  methylPREDNISolone sodium succinate (SOLU-MEDROL) 125 mg/2 mL injection 125 mg (125 mg Intravenous Given 05/13/23 1502)  ipratropium-albuterol (DUONEB) 0.5-2.5 (3) MG/3ML nebulizer solution 3 mL (3 mLs Nebulization Given 05/13/23 1622)  albuterol (PROVENTIL) (2.5 MG/3ML) 0.083% nebulizer solution 2.5 mg (2.5 mg Nebulization Given 05/13/23 1702)  cefTRIAXone (ROCEPHIN) 1 g in sodium chloride 0.9 % 100 mL IVPB (0 g Intravenous Stopped 05/13/23 1849)  ipratropium-albuterol (DUONEB) 0.5-2.5 (3) MG/3ML nebulizer solution 3 mL (3 mLs Nebulization Given 05/13/23 1855)  acetaminophen (TYLENOL) tablet 1,000 mg (1,000 mg Oral Given 05/13/23 1946)  ipratropium-albuterol  (DUONEB) 0.5-2.5 (3) MG/3ML nebulizer solution 3 mL (3 mLs Nebulization Given 05/13/23 2031)    ED Course/ Medical Decision Making/ A&P Clinical Course as of 05/13/23 2104  Sat May 13, 2023  1501 Discussed with RT [JD]    Clinical Course User Index [JD] Laurence Spates, MD                                 Medical Decision Making Amount and/or Complexity of Data Reviewed Labs: ordered. Radiology: ordered.  Risk OTC drugs. Prescription drug management. Decision regarding hospitalization.   Medical Decision Making:   Omkar Stratmann is a 68 y.o. male who presented to the ED today with shortness of breath.  Suspect COPD exacerbation.  He was seen here 2 days ago with negative COVID and flu at that time.  He has had increased sputum production which could be bacterial component.  No chest pain, low concern for ACS.   Patient placed on continuous vitals and telemetry monitoring while in ED which was reviewed periodically.  Reviewed and confirmed nursing documentation for past medical history, family history, social history.  Reassessment and Plan:   Patient is flu positive.  His x-ray is overall reassuring.  Despite breathing treatments, steroids, and exam he is still short of breath.  I think he needs admission for management COPD exacerbation.  Discussed with hospitalist and admitted.   Patient's presentation is most consistent with acute complicated illness / injury requiring diagnostic workup.           Final Clinical Impression(s) / ED Diagnoses Final diagnoses:  COPD exacerbation [facility])    Rx / DC Orders ED Discharge Orders     None         Laurence Spates, MD 05/13/23 2104

## 2023-05-13 NOTE — ED Notes (Signed)
 Pt c/o sob.  96% on ra with rr of 22. Wheezing noted on auscultation.  MD notified.

## 2023-05-14 ENCOUNTER — Observation Stay (HOSPITAL_COMMUNITY): Payer: Medicare Other

## 2023-05-14 DIAGNOSIS — R339 Retention of urine, unspecified: Secondary | ICD-10-CM | POA: Diagnosis not present

## 2023-05-14 DIAGNOSIS — T380X6A Underdosing of glucocorticoids and synthetic analogues, initial encounter: Secondary | ICD-10-CM | POA: Diagnosis present

## 2023-05-14 DIAGNOSIS — J441 Chronic obstructive pulmonary disease with (acute) exacerbation: Secondary | ICD-10-CM

## 2023-05-14 DIAGNOSIS — N3091 Cystitis, unspecified with hematuria: Secondary | ICD-10-CM | POA: Diagnosis present

## 2023-05-14 DIAGNOSIS — R188 Other ascites: Secondary | ICD-10-CM | POA: Diagnosis not present

## 2023-05-14 DIAGNOSIS — E43 Unspecified severe protein-calorie malnutrition: Secondary | ICD-10-CM | POA: Diagnosis present

## 2023-05-14 DIAGNOSIS — Z79899 Other long term (current) drug therapy: Secondary | ICD-10-CM | POA: Diagnosis not present

## 2023-05-14 DIAGNOSIS — Z8249 Family history of ischemic heart disease and other diseases of the circulatory system: Secondary | ICD-10-CM | POA: Diagnosis not present

## 2023-05-14 DIAGNOSIS — D7589 Other specified diseases of blood and blood-forming organs: Secondary | ICD-10-CM | POA: Diagnosis present

## 2023-05-14 DIAGNOSIS — J9690 Respiratory failure, unspecified, unspecified whether with hypoxia or hypercapnia: Secondary | ICD-10-CM | POA: Diagnosis not present

## 2023-05-14 DIAGNOSIS — F1010 Alcohol abuse, uncomplicated: Secondary | ICD-10-CM | POA: Diagnosis present

## 2023-05-14 DIAGNOSIS — N3090 Cystitis, unspecified without hematuria: Secondary | ICD-10-CM | POA: Insufficient documentation

## 2023-05-14 DIAGNOSIS — N401 Enlarged prostate with lower urinary tract symptoms: Secondary | ICD-10-CM | POA: Diagnosis present

## 2023-05-14 DIAGNOSIS — Z7951 Long term (current) use of inhaled steroids: Secondary | ICD-10-CM | POA: Diagnosis not present

## 2023-05-14 DIAGNOSIS — K219 Gastro-esophageal reflux disease without esophagitis: Secondary | ICD-10-CM | POA: Diagnosis present

## 2023-05-14 DIAGNOSIS — R338 Other retention of urine: Secondary | ICD-10-CM | POA: Diagnosis present

## 2023-05-14 DIAGNOSIS — J9601 Acute respiratory failure with hypoxia: Secondary | ICD-10-CM | POA: Diagnosis not present

## 2023-05-14 DIAGNOSIS — J09X2 Influenza due to identified novel influenza A virus with other respiratory manifestations: Secondary | ICD-10-CM

## 2023-05-14 DIAGNOSIS — R739 Hyperglycemia, unspecified: Secondary | ICD-10-CM | POA: Diagnosis not present

## 2023-05-14 DIAGNOSIS — R109 Unspecified abdominal pain: Secondary | ICD-10-CM | POA: Diagnosis not present

## 2023-05-14 DIAGNOSIS — K7689 Other specified diseases of liver: Secondary | ICD-10-CM | POA: Diagnosis not present

## 2023-05-14 DIAGNOSIS — Z681 Body mass index (BMI) 19 or less, adult: Secondary | ICD-10-CM | POA: Diagnosis not present

## 2023-05-14 DIAGNOSIS — T380X5A Adverse effect of glucocorticoids and synthetic analogues, initial encounter: Secondary | ICD-10-CM | POA: Diagnosis not present

## 2023-05-14 DIAGNOSIS — J101 Influenza due to other identified influenza virus with other respiratory manifestations: Secondary | ICD-10-CM | POA: Diagnosis present

## 2023-05-14 DIAGNOSIS — Z1152 Encounter for screening for COVID-19: Secondary | ICD-10-CM | POA: Diagnosis not present

## 2023-05-14 DIAGNOSIS — K5900 Constipation, unspecified: Secondary | ICD-10-CM | POA: Diagnosis not present

## 2023-05-14 DIAGNOSIS — F17200 Nicotine dependence, unspecified, uncomplicated: Secondary | ICD-10-CM | POA: Diagnosis present

## 2023-05-14 DIAGNOSIS — Z91128 Patient's intentional underdosing of medication regimen for other reason: Secondary | ICD-10-CM | POA: Diagnosis not present

## 2023-05-14 DIAGNOSIS — E785 Hyperlipidemia, unspecified: Secondary | ICD-10-CM | POA: Diagnosis present

## 2023-05-14 DIAGNOSIS — I10 Essential (primary) hypertension: Secondary | ICD-10-CM | POA: Diagnosis present

## 2023-05-14 DIAGNOSIS — R3129 Other microscopic hematuria: Secondary | ICD-10-CM | POA: Diagnosis not present

## 2023-05-14 DIAGNOSIS — J9621 Acute and chronic respiratory failure with hypoxia: Secondary | ICD-10-CM | POA: Diagnosis not present

## 2023-05-14 DIAGNOSIS — E872 Acidosis, unspecified: Secondary | ICD-10-CM | POA: Diagnosis present

## 2023-05-14 LAB — CBG MONITORING, ED: Glucose-Capillary: 111 mg/dL — ABNORMAL HIGH (ref 70–99)

## 2023-05-14 LAB — I-STAT ARTERIAL BLOOD GAS, ED
Acid-Base Excess: 0 mmol/L (ref 0.0–2.0)
Bicarbonate: 24.8 mmol/L (ref 20.0–28.0)
Calcium, Ion: 1.19 mmol/L (ref 1.15–1.40)
HCT: 40 % (ref 39.0–52.0)
Hemoglobin: 13.6 g/dL (ref 13.0–17.0)
O2 Saturation: 95 %
Patient temperature: 98.6
Potassium: 3.8 mmol/L (ref 3.5–5.1)
Sodium: 139 mmol/L (ref 135–145)
TCO2: 26 mmol/L (ref 22–32)
pCO2 arterial: 39.7 mm[Hg] (ref 32–48)
pH, Arterial: 7.404 (ref 7.35–7.45)
pO2, Arterial: 78 mm[Hg] — ABNORMAL LOW (ref 83–108)

## 2023-05-14 LAB — CBC
HCT: 39.3 % (ref 39.0–52.0)
Hemoglobin: 12.5 g/dL — ABNORMAL LOW (ref 13.0–17.0)
MCH: 32.8 pg (ref 26.0–34.0)
MCHC: 31.8 g/dL (ref 30.0–36.0)
MCV: 103.1 fL — ABNORMAL HIGH (ref 80.0–100.0)
Platelets: 258 10*3/uL (ref 150–400)
RBC: 3.81 MIL/uL — ABNORMAL LOW (ref 4.22–5.81)
RDW: 14.1 % (ref 11.5–15.5)
WBC: 2.9 10*3/uL — ABNORMAL LOW (ref 4.0–10.5)
nRBC: 0 % (ref 0.0–0.2)

## 2023-05-14 LAB — I-STAT CG4 LACTIC ACID, ED
Lactic Acid, Venous: 3.3 mmol/L (ref 0.5–1.9)
Lactic Acid, Venous: 1.5 mmol/L (ref 0.5–1.9)

## 2023-05-14 LAB — COMPREHENSIVE METABOLIC PANEL
ALT: 14 U/L (ref 0–44)
AST: 24 U/L (ref 15–41)
Albumin: 3.3 g/dL — ABNORMAL LOW (ref 3.5–5.0)
Alkaline Phosphatase: 51 U/L (ref 38–126)
Anion gap: 10 (ref 5–15)
BUN: 11 mg/dL (ref 8–23)
CO2: 19 mmol/L — ABNORMAL LOW (ref 22–32)
Calcium: 8.2 mg/dL — ABNORMAL LOW (ref 8.9–10.3)
Chloride: 113 mmol/L — ABNORMAL HIGH (ref 98–111)
Creatinine, Ser: 0.87 mg/dL (ref 0.61–1.24)
GFR, Estimated: >60 mL/min (ref >60)
Glucose, Bld: 148 mg/dL — ABNORMAL HIGH (ref 70–99)
Potassium: 3.9 mmol/L (ref 3.5–5.1)
Sodium: 142 mmol/L (ref 135–145)
Total Bilirubin: 0.4 mg/dL (ref 0.0–1.2)
Total Protein: 6.1 g/dL — ABNORMAL LOW (ref 6.5–8.1)

## 2023-05-14 LAB — URINALYSIS, ROUTINE W REFLEX MICROSCOPIC
Bacteria, UA: NONE SEEN
Bilirubin Urine: NEGATIVE
Glucose, UA: 50 mg/dL — AB
Ketones, ur: NEGATIVE mg/dL
Leukocytes,Ua: NEGATIVE
Nitrite: NEGATIVE
Protein, ur: NEGATIVE mg/dL
Specific Gravity, Urine: 1.02 (ref 1.005–1.030)
pH: 5 (ref 5.0–8.0)

## 2023-05-14 LAB — CK: Total CK: 198 U/L (ref 49–397)

## 2023-05-14 LAB — RAPID URINE DRUG SCREEN, HOSP PERFORMED
Amphetamines: NOT DETECTED
Barbiturates: NOT DETECTED
Benzodiazepines: NOT DETECTED
Cocaine: POSITIVE — AB
Opiates: NOT DETECTED
Tetrahydrocannabinol: NOT DETECTED

## 2023-05-14 LAB — VITAMIN B12: Vitamin B-12: 572 pg/mL (ref 180–914)

## 2023-05-14 LAB — ETHANOL: Alcohol, Ethyl (B): <10 mg/dL (ref <10)

## 2023-05-14 LAB — BRAIN NATRIURETIC PEPTIDE: B Natriuretic Peptide: 169 pg/mL — ABNORMAL HIGH (ref 0.0–100.0)

## 2023-05-14 LAB — HIV ANTIBODY (ROUTINE TESTING W REFLEX): HIV Screen 4th Generation wRfx: NONREACTIVE

## 2023-05-14 MED ORDER — ETOMIDATE 2 MG/ML IV SOLN
INTRAVENOUS | Status: AC
Start: 2023-05-14 — End: 2023-05-15
  Filled 2023-05-14: qty 20

## 2023-05-14 MED ORDER — SODIUM CHLORIDE 0.9 % IV BOLUS
1000.0000 mL | Freq: Once | INTRAVENOUS | Status: AC
  Administered 2023-05-14: 1000 mL via INTRAVENOUS

## 2023-05-14 MED ORDER — ORAL CARE MOUTH RINSE: 15.0000 mL | OROMUCOSAL | Status: DC | PRN

## 2023-05-14 MED ORDER — KETAMINE HCL 50 MG/5ML IJ SOSY
PREFILLED_SYRINGE | INTRAMUSCULAR | Status: DC
Start: 2023-05-14 — End: 2023-05-14
  Filled 2023-05-14: qty 10

## 2023-05-14 MED ORDER — IOHEXOL 350 MG/ML SOLN
75.0000 mL | Freq: Once | INTRAVENOUS | Status: AC | PRN
  Administered 2023-05-14: 75 mL via INTRAVENOUS

## 2023-05-14 MED ORDER — IPRATROPIUM-ALBUTEROL 0.5-2.5 (3) MG/3ML IN SOLN
3.0000 mL | RESPIRATORY_TRACT | Status: AC | PRN
  Administered 2023-05-14 – 2023-05-15 (×4): 3 mL via RESPIRATORY_TRACT
  Filled 2023-05-14 (×4): qty 3

## 2023-05-14 MED ORDER — ROCURONIUM BROMIDE 10 MG/ML (PF) SYRINGE
PREFILLED_SYRINGE | INTRAVENOUS | Status: AC
  Filled 2023-05-14: qty 10

## 2023-05-14 MED ORDER — HYDROXYZINE HCL 25 MG PO TABS
25.0000 mg | ORAL_TABLET | Freq: Three times a day (TID) | ORAL | Status: DC | PRN
  Administered 2023-05-16 – 2023-05-17 (×2): 25 mg via ORAL
  Filled 2023-05-14 (×2): qty 1

## 2023-05-14 MED ORDER — OSELTAMIVIR PHOSPHATE 30 MG PO CAPS
30.0000 mg | ORAL_CAPSULE | Freq: Two times a day (BID) | ORAL | Status: DC
  Administered 2023-05-14: 30 mg via ORAL
  Filled 2023-05-14 (×4): qty 1

## 2023-05-14 MED ORDER — SUCCINYLCHOLINE CHLORIDE 200 MG/10ML IV SOSY
PREFILLED_SYRINGE | INTRAVENOUS | Status: DC
Start: 2023-05-14 — End: 2023-05-14
  Filled 2023-05-14: qty 10

## 2023-05-14 MED ORDER — ALBUTEROL SULFATE (2.5 MG/3ML) 0.083% IN NEBU
10.0000 mg/h | INHALATION_SOLUTION | RESPIRATORY_TRACT | Status: DC
Start: 2023-05-14 — End: 2023-05-16

## 2023-05-14 MED ORDER — CHLORHEXIDINE GLUCONATE CLOTH 2 % EX PADS
6.0000 | MEDICATED_PAD | Freq: Every day | CUTANEOUS | Status: DC
  Administered 2023-05-14 – 2023-05-17 (×4): 6 via TOPICAL

## 2023-05-14 MED ORDER — MIDAZOLAM HCL 2 MG/2ML IJ SOLN: 2.0000 mg | Freq: Once | INTRAMUSCULAR | Status: DC

## 2023-05-14 MED ORDER — DEXMEDETOMIDINE HCL IN NACL 400 MCG/100ML IV SOLN
0.0000 ug/kg/h | INTRAVENOUS | Status: DC
  Administered 2023-05-14: 0.9 ug/kg/h via INTRAVENOUS
  Administered 2023-05-14: 0.4 ug/kg/h via INTRAVENOUS
  Administered 2023-05-15: 1 ug/kg/h via INTRAVENOUS
  Administered 2023-05-15: 0.8 ug/kg/h via INTRAVENOUS
  Filled 2023-05-14 (×4): qty 100

## 2023-05-14 MED ORDER — KETAMINE HCL 50 MG/5ML IJ SOSY: 60.0000 mg | PREFILLED_SYRINGE | Freq: Once | INTRAMUSCULAR | Status: DC

## 2023-05-14 MED ORDER — FENTANYL CITRATE PF 50 MCG/ML IJ SOSY: 50.0000 ug | PREFILLED_SYRINGE | Freq: Once | INTRAMUSCULAR | Status: DC

## 2023-05-14 MED ORDER — PANTOPRAZOLE SODIUM 40 MG PO TBEC
40.0000 mg | DELAYED_RELEASE_TABLET | Freq: Every day | ORAL | Status: DC
  Administered 2023-05-14: 40 mg via ORAL
  Filled 2023-05-14: qty 1

## 2023-05-14 MED ORDER — ALBUTEROL SULFATE (2.5 MG/3ML) 0.083% IN NEBU
INHALATION_SOLUTION | RESPIRATORY_TRACT | Status: AC
  Administered 2023-05-14: 10 mg/h via RESPIRATORY_TRACT
  Filled 2023-05-14: qty 12

## 2023-05-14 MED ORDER — FENTANYL CITRATE PF 50 MCG/ML IJ SOSY
PREFILLED_SYRINGE | INTRAMUSCULAR | Status: DC
Start: 2023-05-14 — End: 2023-05-14
  Filled 2023-05-14: qty 2

## 2023-05-14 MED ORDER — PANTOPRAZOLE SODIUM 40 MG IV SOLR
40.0000 mg | Freq: Every day | INTRAVENOUS | Status: DC
  Administered 2023-05-15: 40 mg via INTRAVENOUS
  Filled 2023-05-14: qty 10

## 2023-05-14 MED ORDER — ORAL CARE MOUTH RINSE
15.0000 mL | OROMUCOSAL | Status: DC
Start: 2023-05-14 — End: 2023-05-16
  Administered 2023-05-15 – 2023-05-16 (×5): 15 mL via OROMUCOSAL

## 2023-05-14 MED ORDER — ETOMIDATE 2 MG/ML IV SOLN
INTRAVENOUS | Status: AC
  Filled 2023-05-14: qty 10

## 2023-05-14 MED ORDER — SODIUM CHLORIDE 0.9 % IV SOLN: INTRAVENOUS | Status: DC

## 2023-05-14 MED ORDER — MIDAZOLAM HCL 2 MG/2ML IJ SOLN
INTRAMUSCULAR | Status: AC
  Filled 2023-05-14: qty 2

## 2023-05-14 MED ORDER — METHYLPREDNISOLONE SODIUM SUCC 125 MG IJ SOLR
60.0000 mg | Freq: Two times a day (BID) | INTRAMUSCULAR | Status: DC
  Administered 2023-05-14 – 2023-05-16 (×5): 60 mg via INTRAVENOUS
  Filled 2023-05-14 (×5): qty 2

## 2023-05-14 MED ORDER — ROCURONIUM BROMIDE 10 MG/ML (PF) SYRINGE
100.0000 mg | PREFILLED_SYRINGE | Freq: Once | INTRAVENOUS | Status: DC
Start: 2023-05-14 — End: 2023-05-15

## 2023-05-14 NOTE — Progress Notes (Addendum)
 HD#0 Subjective:   Summary: Jason Wong is a 68 y.o. male with a PMH significant for uncontrolled COPD, current tobacco use, and recurrent ED visits for exacerbations of same who presented to the [facility] with shortness of breath   Overnight Events: Admitted overnight, persistent respiratory distress.  This AM, patient is having audible wheezing and respiratory distress. Very poor air movement. Discussed placing patient on BiPap, code status reiterated with patient d/t c/f resp status. Patient is full code and would like intubation if necessary.   Objective:  Vital signs in last 24 hours: Vitals:   05/14/23 0900 05/14/23 0921 05/14/23 1111 05/14/23 1137  BP: (!) 129/99 (!) 151/90  (!) 132/101  Pulse: 100 (!) 102 (!) 102 94  Resp: (!) 22 (!) 23 (!) 30 (!) 24  Temp:    98.3 F (36.8 C)  TempSrc:    Axillary  SpO2: 100% 100% 100% 99%     Physical Exam:  Constitutional: Ill appearing HENT: Oropharynx without signs of infection Cardiovascular: regular rate and rhythm, no m/r/g Pulmonary/Chest: increased work of breathing on room air, Diffuse wheezing bilaterally. Poor air movement.  Abdominal: soft, non-tender, non-distended  There were no vitals filed for this visit.    Intake/Output Summary (Last 24 hours) at 05/14/2023 1219 Last data filed at 05/14/2023 1149 Gross per 24 hour  Intake 2000 ml  Output 3700 ml  Net -1700 ml   Net IO Since Admission: -1,700 mL [05/14/23 1219]  Pertinent Labs:    Latest Ref Rng & Units 05/14/2023    9:03 AM 05/14/2023    2:05 AM 05/13/2023    2:55 PM  CBC  WBC 4.0 - 10.5 K/uL  2.9  3.9   Hemoglobin 13.0 - 17.0 g/dL 16.1  09.6  04.5   Hematocrit 39.0 - 52.0 % 40.0  39.3  42.4   Platelets 150 - 400 K/uL  258  301        Latest Ref Rng & Units 05/14/2023    9:03 AM 05/14/2023    2:05 AM 05/13/2023    2:55 PM  CMP  Glucose 70 - 99 mg/dL  409  811   BUN 8 - 23 mg/dL  11  17   Creatinine 9.14 - 1.24 mg/dL  7.82  9.56   Sodium 213 - 145  mmol/L 139  142  142   Potassium 3.5 - 5.1 mmol/L 3.8  3.9  4.1   Chloride 98 - 111 mmol/L  113  109   CO2 22 - 32 mmol/L  19  20   Calcium 8.9 - 10.3 mg/dL  8.2  9.7   Total Protein 6.5 - 8.1 g/dL  6.1    Total Bilirubin 0.0 - 1.2 mg/dL  0.4    Alkaline Phos 38 - 126 U/L  51    AST 15 - 41 U/L  24    ALT 0 - 44 U/L  14      Assessment/Plan:   Principal Problem:   COPD with acute exacerbation (HCC) Active Problems:   Tobacco use   Influenza A   Protein-calorie malnutrition (HCC)   Urinary retention due to benign prostatic hyperplasia  COPD exacerbation Influenza A Poorly controlled COPD with non adherence to inhalers. Normal CXR on admission and again this PM.No Chest pain. PE wells score of 0. No signs of peripheral congestion or pulmonary edema to suggest cardiac etiology. This AM. His work of breathing is greatly increased but saturating well on room air. ABG relatively  unremarkable. Patient was reassessed following rounds. He has been placed on BiPAP with some relief of his symptoms, but still was not feeling well. Exam still with diffuse wheezing, poor air movement, though a bit improved on BiPAP. Later in the afternoon, patient was reassessed by the call team and was found to have worsening respiratory decline. PCCM consulted - appreciate their help. Patient taken to ICU for increased level of care. - Per PCCM, appreciate them greatly  Elevated lactic acid - Resolved Without metabolic acidosis - Resolved Resolved with fluids  BPH Urinary retention Hematuria Foley catheter placed, CT hematuria study completed. Study showing diffuse bladder wall thickening. Differential would include hypertrophy, cystitis or infiltrating disease. No signs of UTI on UA.   Concern for alcohol use disorder Substance use No documented or subjective history of alcohol withdrawal, seizures or DT.  Placed on CIWA this AM, has been receiving ativan. Wonder how much 2-2 withdrawal versus respiratory  distress.  - UDS cocaine + - CIWA w/ ativan - Counsel cessation at discharge   Macrocytosis without anemia Stable Hgb. Likely in the setting of chronic alcohol use. However, will check other labs below - Supplementing Folate - B12 levels wnl   HTN HLD - Will hold amlodipine as his BP is stable - Continue lipitor    GERD - Continue pantoprazole  Diet: NPO IVF: None,None Code: Full Family Update: Earnestine Mealing updated per patient request this AM - [phone] .   Lovie Macadamia MD Internal Medicine Resident PGY-1 Pager: [phone] Please contact the on call pager after 5 pm and on weekends at [phone].

## 2023-05-14 NOTE — Hospital Course (Signed)
 COPD exacerbation Influenza A Poorly controlled COPD with non adherence to inhalers. Normal CXR on admission. No chest pain. PE wells score of 0. No signs of peripheral congestion or pulmonary edema to suggest cardiac etiology.  He was placed on BiPAP with some relief of his symptoms, but was not feeling well. Required ICU admission for close monitoring and sedation while on BiPAP, but did not require intubation. Treated with tamiflu, inhalers, nebulizers, and prednisone. Follow ICU step down, ambulated without desaturation on room air and was medically cleared to go home.    Abdominal Pain Began on 05/17/2023, with left upper quadrant / epigastric abdominal pain. Worse with movement. Positive Carnett's sign. No rebound or guarding. Will treat with tylenol and continue PPI.  Elevated lactic acid - Resolved Without metabolic acidosis - Resolved Resolved with fluids   BPH Urinary retention Hematuria Foley catheter placed, CT hematuria study completed. Study showing diffuse bladder wall thickening. Differential would include hypertrophy, cystitis or infiltrating disease. No signs of UTI on UA. Will benefit from Urology follow up - Amb Ref to Urology   Concern for alcohol use disorder Substance use No documented or subjective history of alcohol withdrawal, seizures or DT.  Received CIWA triggered ativan but eventually required ICU admission for worsening RF and sedation.  - UDS cocaine +, SUD resources given - Counsel cessation at discharge   Macrocytosis without anemia Stable Hgb. Likely in the setting of chronic alcohol use. - Supplementing Folate - B12 levels wnl   HTN - Continue amlodipine  HLD - Continue lipitor    GERD - Continue pantoprazole

## 2023-05-14 NOTE — Progress Notes (Signed)
 RT note. Patient transferred from ED to 2Hrm 7 on bipap without any complications. RT will continue to monitor.

## 2023-05-14 NOTE — Progress Notes (Signed)
 CSW added substance abuse resources to patient's AVS.  Edwin Dada, MSW, LCSW Transitions of Care  Clinical Social Worker II [phone]

## 2023-05-14 NOTE — Discharge Instructions (Signed)

## 2023-05-14 NOTE — Progress Notes (Signed)
 More settled on precedex.   RR 21 with no accessory muscle use.  Continue to monitor. Hopefully can avoid intubation.  Lynnell Catalan, MD [facility] ICU Physician [facility] Critical Care  Pager: [phone] Or Epic Secure Chat After hours: [phone].  05/14/2023, 5:17 PM

## 2023-05-14 NOTE — ED Notes (Signed)
 Verified with ccmd that pt is on the monitor

## 2023-05-14 NOTE — Progress Notes (Signed)
 RT note. Patient placed on bipap at this time per MD.   05/14/23 0900  Vent Select  Invasive or Noninvasive Noninvasive  Adult Vent Y  Vent start date 05/14/23  Vent start time 0831  Adult Ventilator Settings  Vent Type Servo i  Humidity HME  Vent Mode (S)  PCV;BIPAP  Set Rate 15 bmp  FiO2 (%) 50 %  I Time 0.7 Sec(s)  IPAP 10 cmH20  EPAP 5 cmH20  Pressure Control 5 cmH20  PEEP 5 cmH20  Adult Ventilator Measurements  Peak Airway Pressure 10 L/min  Mean Airway Pressure 7 cmH20  Resp Rate Spontaneous 11 br/min  Resp Rate Total 26 br/min  Exhaled Vt 711 mL  Measured Ve 19.5 L  I:E Ratio Measured 1:2.7  Auto PEEP 0 cmH20  Total PEEP 5 cmH20  SpO2 100 %  Adult Ventilator Alarms  Alarms On Y  Ve High Alarm 20 L/min  Ve Low Alarm 5 L/min  Resp Rate High Alarm 40 br/min  Resp Rate Low Alarm 10  PEEP Low Alarm 2 cmH2O  Press High Alarm 40 cmH2O  T Apnea 20 sec(s)

## 2023-05-14 NOTE — ED Notes (Signed)
 Pt incontinent of bowel. Put on bedpan, cleaned up and new linen provided to pt. Repositioned in bed with call bell nearby

## 2023-05-14 NOTE — Consult Note (Signed)
 NAME:  Jason Wong, MRN:  [id number], DOB:  1955/09/14, LOS: 0 ADMISSION DATE:  05/13/2023, CONSULTATION DATE:  05/14/2023 REFERRING MD:  Antony Contras - IMTS, CHIEF COMPLAINT:  dyspnea.    History of Present Illness:  68 year old man who presented with 48 hours of increasing dyspnea.  Increased cough for prior 2 weeks.  Has been out of triple combination inhaler since December.   History of tobacco abuse and COPD with prior intubation.   Initially admitted to IMTS and started on BiPAP, bronchodilators and steroids. Additional concern for alcohol withdrawal.   Pertinent  Medical History   Past Medical History:  Diagnosis Date   Asthma    COPD (chronic obstructive pulmonary disease) (HCC)    Hepatitis C antibody positive in blood    HLD (hyperlipidemia)    HTN (hypertension)    On home oxygen therapy    per pt 08-31-2021 uses 02 about every other day   Significant Hospital Events: Including procedures, antibiotic start and stop dates in addition to other pertinent events   2/23 - admitted to IMTS  Interim History / Subjective:  Continues to be tachypneic despite multiple doses of bronchodilators. Patient states he's not getting better and requesting intubation (in full sentence without dyspnea)  Objective   Blood pressure 122/88, pulse 97, temperature 98.3 F (36.8 C), temperature source Axillary, resp. rate (!) 24, SpO2 99%.    Vent Mode: BIPAP FiO2 (%):  [30 %-50 %] 30 % Set Rate:  [15 bmp] 15 bmp PEEP:  [5 cmH20] 5 cmH20   Intake/Output Summary (Last 24 hours) at 05/14/2023 1432 Last data filed at 05/14/2023 1149 Gross per 24 hour  Intake 2000 ml  Output 3700 ml  Net -1700 ml   There were no vitals filed for this visit.  Examination: General: thin man in distress.  HENT: BiPAP mask with good seal Lungs: fine expiratory wheezes. Good air entry centrally Cardiovascular: HS normal, no JVD Abdomen: soft, no distentions Extremities: no edema Neuro: no focal  deficits. GU: foley in place - mild bloody tinged  Resolved Hospital Problem list   Normal pH on ABG, hypoxic. Mild metabolic acidosis. Lactate 1.5 WBC 2.9 CXR - hyperinflated but no infiltrate. Assessment & Plan:  Acute hypoxic respiratory failure due to acute COPD exacerbation related to influenza A infection. COPD Acute urinary retention with hemorrhagic cystitis.  Alcohol and   Plan:  - Continue BIPAP - Try low dose precedex - still tachypneic but moving more air and speaking in sentences and grunting - anxiety component? - If still no symptomatic improvement, will intubate at that time.  - Continue current bronchodilators - Increase IV steroids.  - Tamiflu - Hold on antibiotics. No signs of HF. History not consistent with VTE  Best Practice (right click and "Reselect all SmartList Selections" daily)   Diet/type: tubefeeds and NPO w/ meds via tube DVT prophylaxis LMWH Pressure ulcer(s): N/A GI prophylaxis: H2B Lines: N/A Foley:  Yes, and it is still needed Code Status:  full code Last date of multidisciplinary goals of care discussion [pending]   CRITICAL CARE Performed by: Lynnell Catalan   Total critical care time: 40 minutes  Critical care time was exclusive of separately billable procedures and treating other patients.  Critical care was necessary to treat or prevent imminent or life-threatening deterioration.  Critical care was time spent personally by me on the following activities: development of treatment plan with patient and/or surrogate as well as nursing, discussions with consultants, evaluation of patient's response  to treatment, examination of patient, obtaining history from patient or surrogate, ordering and performing treatments and interventions, ordering and review of laboratory studies, ordering and review of radiographic studies, pulse oximetry, re-evaluation of patient's condition and participation in multidisciplinary rounds.  Lynnell Catalan, MD  [facility] ICU Physician [facility] [place] Critical Care  Pager: [phone] Mobile: [phone] After hours: [phone].

## 2023-05-14 NOTE — Progress Notes (Signed)
 Patient seen at bedside. He remains on Bipap since 9AM with increased work of breathing. ABG have been stable and oxygen saturation stable.   P: With increased work of breathing despite bipap support will call PCCM for possible intubation. Patient confirms full code and thay he would want to be intubated. Team called nephew earlier today as well to update.

## 2023-05-15 DIAGNOSIS — J9601 Acute respiratory failure with hypoxia: Secondary | ICD-10-CM | POA: Diagnosis not present

## 2023-05-15 DIAGNOSIS — J441 Chronic obstructive pulmonary disease with (acute) exacerbation: Secondary | ICD-10-CM | POA: Diagnosis not present

## 2023-05-15 DIAGNOSIS — J09X2 Influenza due to identified novel influenza A virus with other respiratory manifestations: Secondary | ICD-10-CM | POA: Diagnosis not present

## 2023-05-15 LAB — CBC
HCT: 43.5 % (ref 39.0–52.0)
Hemoglobin: 14.4 g/dL (ref 13.0–17.0)
MCH: 33 pg (ref 26.0–34.0)
MCHC: 33.1 g/dL (ref 30.0–36.0)
MCV: 99.5 fL (ref 80.0–100.0)
Platelets: 241 10*3/uL (ref 150–400)
RBC: 4.37 MIL/uL (ref 4.22–5.81)
RDW: 13.4 % (ref 11.5–15.5)
WBC: 2.5 10*3/uL — ABNORMAL LOW (ref 4.0–10.5)
nRBC: 0 % (ref 0.0–0.2)

## 2023-05-15 LAB — COMPREHENSIVE METABOLIC PANEL
ALT: 14 U/L (ref 0–44)
AST: 22 U/L (ref 15–41)
Albumin: 3.3 g/dL — ABNORMAL LOW (ref 3.5–5.0)
Alkaline Phosphatase: 57 U/L (ref 38–126)
Anion gap: 11 (ref 5–15)
BUN: 10 mg/dL (ref 8–23)
CO2: 24 mmol/L (ref 22–32)
Calcium: 9.1 mg/dL (ref 8.9–10.3)
Chloride: 104 mmol/L (ref 98–111)
Creatinine, Ser: 0.76 mg/dL (ref 0.61–1.24)
GFR, Estimated: >60 mL/min (ref >60)
Glucose, Bld: 172 mg/dL — ABNORMAL HIGH (ref 70–99)
Potassium: 4.7 mmol/L (ref 3.5–5.1)
Sodium: 139 mmol/L (ref 135–145)
Total Bilirubin: 0.3 mg/dL (ref 0.0–1.2)
Total Protein: 6.7 g/dL (ref 6.5–8.1)

## 2023-05-15 LAB — URINE CULTURE
Culture: NO GROWTH
Special Requests: NORMAL

## 2023-05-15 LAB — GLUCOSE, CAPILLARY
Glucose-Capillary: 183 mg/dL — ABNORMAL HIGH (ref 70–99)
Glucose-Capillary: 123 mg/dL — ABNORMAL HIGH (ref 70–99)

## 2023-05-15 LAB — PROCALCITONIN: Procalcitonin: <0.1 ng/mL

## 2023-05-15 MED ORDER — ENSURE ENLIVE PO LIQD
237.0000 mL | Freq: Three times a day (TID) | ORAL | Status: DC
  Administered 2023-05-16 – 2023-05-17 (×4): 237 mL via ORAL

## 2023-05-15 MED ORDER — INSULIN ASPART 100 UNIT/ML IJ SOLN
1.0000 [IU] | INTRAMUSCULAR | Status: DC
  Administered 2023-05-15: 1 [IU] via SUBCUTANEOUS
  Administered 2023-05-16: 2 [IU] via SUBCUTANEOUS
  Administered 2023-05-16 (×2): 1 [IU] via SUBCUTANEOUS

## 2023-05-15 MED ORDER — OSELTAMIVIR PHOSPHATE 75 MG PO CAPS
75.0000 mg | ORAL_CAPSULE | Freq: Two times a day (BID) | ORAL | Status: DC
  Administered 2023-05-15 – 2023-05-17 (×4): 75 mg via ORAL
  Filled 2023-05-15 (×6): qty 1

## 2023-05-15 MED ORDER — FOLIC ACID 5 MG/ML IJ SOLN
1.0000 mg | Freq: Every day | INTRAMUSCULAR | Status: DC
  Administered 2023-05-15: 1 mg via INTRAVENOUS
  Filled 2023-05-15 (×2): qty 0.2

## 2023-05-15 NOTE — Progress Notes (Signed)
 OT Cancellation Note  Patient Details Name: [name] MRN: [id number] DOB: August 20, 1955   Cancelled Treatment:    Reason Eval/Treat Not Completed: Fatigue/lethargy limiting ability to participate Discussed with staff; pt remains on BiPAP, not alert enough to participate in OT eval today. Will check back tomorrow.   [name] 05/15/2023, 12:14 PM

## 2023-05-15 NOTE — Progress Notes (Signed)
 Pt trialed off bipap only to tolerate for less than 5 minutes. Pt with increased work of breathing and tacypnea on 3L nasal cannula. Pt returned back to bipap.  RT will continue to monitor and be available as needed.

## 2023-05-15 NOTE — Progress Notes (Addendum)
   NAME:  Jason Wong, MRN:  [id number], DOB:  06/14/1955, LOS: 1 ADMISSION DATE:  05/13/2023, CONSULTATION DATE:  05/14/2023 REFERRING MD:  Antony Contras - IMTS, CHIEF COMPLAINT:  dyspnea.    History of Present Illness:  68 year old man who presented with 48 hours of increasing dyspnea.  Increased cough for prior 2 weeks.  Has been out of triple combination inhaler since December.   History of tobacco abuse and COPD with prior intubation.   Initially admitted to IMTS and started on BiPAP, bronchodilators and steroids. Additional concern for alcohol withdrawal.   Pertinent  Medical History   Past Medical History:  Diagnosis Date   Asthma    COPD (chronic obstructive pulmonary disease) (HCC)    Hepatitis C antibody positive in blood    HLD (hyperlipidemia)    HTN (hypertension)    On home oxygen therapy    per pt 08-31-2021 uses 02 about every other day   Significant Hospital Events: Including procedures, antibiotic start and stop dates in addition to other pertinent events   2/23 - admitted to IMTS > tx to ICU for BiPAP, Flu A hypoxia  Interim History / Subjective:  Remains on BiPAP since 0900 yesterday Trial off elicited significant work of breathing, no improvement with breathing tx.  Precedex 0.8 - sleepy.  3L negative for admission Tmax 97.9  Objective   Blood pressure (!) 159/130, pulse (!) 53, temperature (!) 97 F (36.1 C), temperature source Axillary, resp. rate 14, weight 49.9 kg, SpO2 98%.    Vent Mode: BIPAP FiO2 (%):  [30 %] 30 % Set Rate:  [15 bmp] 15 bmp PEEP:  [8 cmH20] 8 cmH20   Intake/Output Summary (Last 24 hours) at 05/15/2023 1200 Last data filed at 05/15/2023 1125 Gross per 24 hour  Intake 286.76 ml  Output 1915 ml  Net -1628.24 ml   Filed Weights   05/14/23 1444 05/15/23 0600  Weight: 55 kg 49.9 kg    Examination: General: thin adult male in NAD on BiPAP HENT: BiPAP mask with good seal, [place]/AT Lungs: Clear inspiratory sounds. Moaning on  expiration. No obvious wheeze or rhonchi Cardiovascular: RRR, no MRG, no JVD Abdomen: Soft, NT, ND Extremities: no acute deformity or ROM limitation.  Neuro: sedated - RASS -2 GU: foley in place  Resolved Hospital Problem list     Assessment & Plan:  Acute hypoxic respiratory failure  Influenza A infection. COPD with acute exacerbation - PRN BiPAP for work of breathing - Precedex to better tolerate BiPAP now weaned off - Will give another trial off BiPAP - Sat goal > 95% - Triple neb therapy - IV steroids - Tamiflu - low threshold to start antibiotics if not improving. Hold for now  Acute urinary retention with hemorrhagic cystitis.  - foley in place  Alcohol use - CIWA protocol  HTN - resume home amlodipine when taking PO - PRN hydralazine in the mean time  GERD - PPI  Best Practice (right click and "Reselect all SmartList Selections" daily)   Diet/type: tubefeeds and NPO w/ meds via tube DVT prophylaxis LMWH Pressure ulcer(s): N/A GI prophylaxis: H2B Lines: N/A Foley:  Yes, and it is still needed Code Status:  full code Last date of multidisciplinary goals of care discussion [pending]    Joneen Roach, AGACNP-BC [facility]  See Amion for personal pager PCCM on call pager [phone] until 7pm. Please call Elink 7p-7a. [phone]  05/15/2023 3:45 PM

## 2023-05-15 NOTE — Progress Notes (Signed)
 PT Cancellation Note  Patient Details Name: Jason Wong MRN: [id number] DOB: [birth year]   Cancelled Treatment:    Reason Eval/Treat Not Completed: Fatigue/lethargy limiting ability to participate (pt unable to arouse to sternal rub or even elevating trunk, unable to participate at this time)   [name] 05/15/2023, 11:40 AM [name], PT Acute Rehabilitation Services Office: [phone]

## 2023-05-16 ENCOUNTER — Telehealth: Payer: Self-pay | Admitting: Pulmonary Disease

## 2023-05-16 DIAGNOSIS — J441 Chronic obstructive pulmonary disease with (acute) exacerbation: Secondary | ICD-10-CM | POA: Diagnosis not present

## 2023-05-16 DIAGNOSIS — J9601 Acute respiratory failure with hypoxia: Secondary | ICD-10-CM | POA: Diagnosis not present

## 2023-05-16 DIAGNOSIS — J09X2 Influenza due to identified novel influenza A virus with other respiratory manifestations: Secondary | ICD-10-CM | POA: Diagnosis not present

## 2023-05-16 DIAGNOSIS — J9621 Acute and chronic respiratory failure with hypoxia: Secondary | ICD-10-CM

## 2023-05-16 LAB — RESPIRATORY PANEL BY PCR

## 2023-05-16 LAB — BASIC METABOLIC PANEL
Anion gap: 11 (ref 5–15)
BUN: 26 mg/dL — ABNORMAL HIGH (ref 8–23)
CO2: 27 mmol/L (ref 22–32)
Calcium: 9.4 mg/dL (ref 8.9–10.3)
Chloride: 101 mmol/L (ref 98–111)
Creatinine, Ser: 0.87 mg/dL (ref 0.61–1.24)
GFR, Estimated: >60 mL/min (ref >60)
Glucose, Bld: 113 mg/dL — ABNORMAL HIGH (ref 70–99)
Potassium: 4.4 mmol/L (ref 3.5–5.1)
Sodium: 139 mmol/L (ref 135–145)

## 2023-05-16 LAB — GLUCOSE, CAPILLARY
Glucose-Capillary: 113 mg/dL — ABNORMAL HIGH (ref 70–99)
Glucose-Capillary: 118 mg/dL — ABNORMAL HIGH (ref 70–99)
Glucose-Capillary: 127 mg/dL — ABNORMAL HIGH (ref 70–99)
Glucose-Capillary: 149 mg/dL — ABNORMAL HIGH (ref 70–99)
Glucose-Capillary: 172 mg/dL — ABNORMAL HIGH (ref 70–99)
Glucose-Capillary: 178 mg/dL — ABNORMAL HIGH (ref 70–99)

## 2023-05-16 MED ORDER — MELATONIN 5 MG PO TABS
5.0000 mg | ORAL_TABLET | Freq: Every evening | ORAL | Status: DC | PRN
  Administered 2023-05-16: 5 mg via ORAL
  Filled 2023-05-16: qty 1

## 2023-05-16 MED ORDER — METHYLPREDNISOLONE SODIUM SUCC 40 MG IJ SOLR
40.0000 mg | Freq: Two times a day (BID) | INTRAMUSCULAR | Status: DC
  Administered 2023-05-17: 40 mg via INTRAVENOUS
  Filled 2023-05-16: qty 1

## 2023-05-16 MED ORDER — ACETAMINOPHEN 325 MG PO TABS
650.0000 mg | ORAL_TABLET | Freq: Four times a day (QID) | ORAL | Status: DC | PRN
  Administered 2023-05-16 – 2023-05-17 (×3): 650 mg via ORAL
  Filled 2023-05-16 (×3): qty 2

## 2023-05-16 MED ORDER — FOLIC ACID 1 MG PO TABS
1.0000 mg | ORAL_TABLET | Freq: Every day | ORAL | Status: DC
  Administered 2023-05-16 – 2023-05-17 (×2): 1 mg via ORAL
  Filled 2023-05-16 (×2): qty 1

## 2023-05-16 MED ORDER — AMLODIPINE BESYLATE 5 MG PO TABS
5.0000 mg | ORAL_TABLET | Freq: Every day | ORAL | Status: DC
  Administered 2023-05-16 – 2023-05-17 (×2): 5 mg via ORAL
  Filled 2023-05-16 (×2): qty 1

## 2023-05-16 MED ORDER — ORAL CARE MOUTH RINSE: 15.0000 mL | OROMUCOSAL | Status: DC | PRN

## 2023-05-16 MED ORDER — INSULIN ASPART 100 UNIT/ML IJ SOLN
1.0000 [IU] | Freq: Three times a day (TID) | INTRAMUSCULAR | Status: DC
  Administered 2023-05-16: 2 [IU] via SUBCUTANEOUS
  Administered 2023-05-17: 1 [IU] via SUBCUTANEOUS

## 2023-05-16 MED ORDER — PANTOPRAZOLE SODIUM 40 MG PO TBEC
40.0000 mg | DELAYED_RELEASE_TABLET | Freq: Every day | ORAL | Status: DC
  Administered 2023-05-16 – 2023-05-17 (×2): 40 mg via ORAL
  Filled 2023-05-16 (×2): qty 1

## 2023-05-16 NOTE — Evaluation (Signed)
 Physical Therapy Evaluation Patient Details Name: Jason Wong MRN: [id number] DOB: [birth year] Today's Date: 05/16/2023  History of Present Illness  68 y/o male admitted 2/22 with dyspnea, tachypnea, COPD exacerbation requiring Bipap. PMhx: HTN, COPD/asthma, HLD, hep C, ETOH and tobacco use.   Clinical Impression  Pt admitted with above. Pt much improved from level of alertness and ability to mobilize this date compared to yesterday. Despite pt report of "I can't breath" pts SpO2 >95% on 2lO2 via [place] while ambulating and on 1Lo2 via [place] at rest. Pt anxious regarding SOB however anticipate will progress well. Pt reports not living in a great neighborhood where there is a lot of gun violence therefore unsure if [facility] agency will pick him up to provide [facility] therapy but pt would definitely benefit as pt was indep PTA. Acute PT to cont to follow.        If plan is discharge home, recommend the following: A little help with walking and/or transfers;A little help with bathing/dressing/bathroom;Help with stairs or ramp for entrance;Assist for transportation   Can travel by private vehicle        Equipment Recommendations Rolling walker (2 wheels)  Recommendations for Other Services       Functional Status Assessment Patient has had a recent decline in their functional status and demonstrates the ability to make significant improvements in function in a reasonable and predictable amount of time.     Precautions / Restrictions Precautions Precautions: Fall;Other (comment) Precaution/Restrictions Comments: monitor O2 Restrictions Weight Bearing Restrictions Per Provider Order: No      Mobility  Bed Mobility Overal bed mobility: Needs Assistance Bed Mobility: Supine to Sit     Supine to sit: Supervision     General bed mobility comments: increased time, HOB elevated, no physical assist    Transfers Overall transfer level: Needs assistance Equipment used: Rolling walker (2  wheels) Transfers: Sit to/from Stand Sit to Stand: Min assist           General transfer comment: light Min A to stand from bedside with RW, verbal cues for hand placement    Ambulation/Gait Ambulation/Gait assistance: Min assist Gait Distance (Feet): 15 Feet Assistive device: Rolling walker (2 wheels) Gait Pattern/deviations: Step-through pattern, Decreased stride length Gait velocity: dec Gait velocity interpretation: <1.31 ft/sec, indicative of household ambulator   General Gait Details: short step height and length, verbal cues for breaths through nose/out mouth. SPO2 at 95% on 2Lo2 via [place]. increased dependency on UEs  Stairs            Wheelchair Mobility     Tilt Bed    Modified Rankin (Stroke Patients Only)       Balance Overall balance assessment: Needs assistance Sitting-balance support: No upper extremity supported, Feet supported Sitting balance-Leahy Scale: Good     Standing balance support: Bilateral upper extremity supported, During functional activity Standing balance-Leahy Scale: Poor                               Pertinent Vitals/Pain Pain Assessment Pain Assessment: No/denies pain    Home Living Family/patient expects to be discharged to:: Private residence Living Arrangements: Other relatives (nephew) Available Help at Discharge: Family;Available 24 hours/day Type of Home: House Home Access: Stairs to enter Entrance Stairs-Rails: Can reach both Entrance Stairs-Number of Steps: 3 Alternate Level Stairs-Number of Steps: 12 Home Layout: Two level;Able to live on main level with bedroom/bathroom Home Equipment: Gilmer Mor - single point;Cane -  quad;Rolling Walker (2 wheels);Shower seat Additional Comments: pt states lives with nephew    Prior Function Prior Level of Function : Independent/Modified Independent             Mobility Comments: pt uses cane primarily for mobility (hurrycane) ADLs Comments: ind, does not drive      Extremity/Trunk Assessment   Upper Extremity Assessment Upper Extremity Assessment: Generalized weakness    Lower Extremity Assessment Lower Extremity Assessment: Generalized weakness    Cervical / Trunk Assessment Cervical / Trunk Assessment: Normal  Communication   Communication Communication: No apparent difficulties    Cognition Arousal: Alert Behavior During Therapy: WFL for tasks assessed/performed, Anxious (anxious regarding SOB)                           PT - Cognition Comments: pt responsive to max encouragement from PT and OT Following commands: Intact       Cueing Cueing Techniques: Verbal cues, Gestural cues     General Comments General comments (skin integrity, edema, etc.): SpO2 95% on 1 L O2 though reporting SOB stating "I can't breath", increasde to 2 L O2 for mobility and cues for pursed lip breathing, pt maintained SpO2 >95% during amb on 2Lo2 via [place]    Exercises     Assessment/Plan    PT Assessment Patient needs continued PT services  PT Problem List Decreased strength;Decreased activity tolerance;Decreased balance;Decreased mobility       PT Treatment Interventions DME instruction;Gait training;Stair training;Functional mobility training;Therapeutic activities;Therapeutic exercise;Balance training    PT Goals (Current goals can be found in the Care Plan section)  Acute Rehab PT Goals Patient Stated Goal: improve breathing PT Goal Formulation: With patient Time For Goal Achievement: 05/30/23 Potential to Achieve Goals: Good    Frequency Min 1X/week     Co-evaluation PT/OT/SLP Co-Evaluation/Treatment: Yes Reason for Co-Treatment: To address functional/ADL transfers PT goals addressed during session: Mobility/safety with mobility         AM-PAC PT "6 Clicks" Mobility  Outcome Measure Help needed turning from your back to your side while in a flat bed without using bedrails?: None Help needed moving from lying on your  back to sitting on the side of a flat bed without using bedrails?: None Help needed moving to and from a bed to a chair (including a wheelchair)?: A Little Help needed standing up from a chair using your arms (e.g., wheelchair or bedside chair)?: A Little Help needed to walk in hospital room?: A Lot (<67ft) Help needed climbing 3-5 steps with a railing? : A Lot 6 Click Score: 18    End of Session Equipment Utilized During Treatment: Gait belt;Oxygen Activity Tolerance: Patient limited by fatigue Patient left: in chair;with chair alarm set;with call bell/phone within reach Nurse Communication: Mobility status PT Visit Diagnosis: Unsteadiness on feet (R26.81);Muscle weakness (generalized) (M62.81)    Time: 1660-6301 PT Time Calculation (min) (ACUTE ONLY): 19 min   Charges:   PT Evaluation $PT Eval Moderate Complexity: 1 Mod   PT General Charges $$ ACUTE PT VISIT: 1 Visit         Lewis Shock, PT, DPT Acute Rehabilitation Services Secure chat preferred Office #: [phone]   Iona Hansen 05/16/2023, 12:50 PM

## 2023-05-16 NOTE — Evaluation (Signed)
 Occupational Therapy Evaluation Patient Details Name: Jason Wong MRN: [id number] DOB: 08/24/1955 Today's Date: 05/16/2023   History of Present Illness   68 y/o male admitted 2/22 with dyspnea, tachypnea, COPD exacerbation requiring Bipap. PMhx: HTN, COPD/asthma, HLD, hep C, ETOH and tobacco use.     Clinical Impressions PTA, pt lives with nephew, typically Modified Independent with ADLs and mobility using cane. Pt presents now with deficits in cardiopulmonary endurance, balance and strength. Pt requires overall CGA-Min A for ADLs and short mobility in room using RW. DOE observed with all activity w/ seated rest breaks and pursed lip breathing cues needed despite SpO2 95% on 2 L O2. Anticipate good progress while admitted to return home with HHOT vs no OT follow up. Plan to emphasize energy conservation strategies in next session.      If plan is discharge home, recommend the following:   A little help with walking and/or transfers;A little help with bathing/dressing/bathroom;Assistance with cooking/housework;Assist for transportation;Help with stairs or ramp for entrance     Functional Status Assessment   Patient has had a recent decline in their functional status and demonstrates the ability to make significant improvements in function in a reasonable and predictable amount of time.     Equipment Recommendations   None recommended by OT     Recommendations for Other Services         Precautions/Restrictions   Precautions Precautions: Fall;Other (comment) Precaution/Restrictions Comments: monitor O2 Restrictions Weight Bearing Restrictions Per Provider Order: No     Mobility Bed Mobility Overal bed mobility: Needs Assistance Bed Mobility: Supine to Sit     Supine to sit: Supervision          Transfers Overall transfer level: Needs assistance Equipment used: Rolling walker (2 wheels) Transfers: Sit to/from Stand Sit to Stand: Min assist            General transfer comment: light Min A to stand from bedside with RW      Balance Overall balance assessment: Needs assistance Sitting-balance support: No upper extremity supported, Feet supported Sitting balance-Leahy Scale: Good     Standing balance support: Bilateral upper extremity supported, During functional activity Standing balance-Leahy Scale: Poor                             ADL either performed or assessed with clinical judgement   ADL Overall ADL's : Needs assistance/impaired Eating/Feeding: Independent   Grooming: Set up;Sitting   Upper Body Bathing: Set up;Sitting   Lower Body Bathing: Minimal assistance;Sitting/lateral leans;Sit to/from stand   Upper Body Dressing : Set up;Sitting   Lower Body Dressing: Minimal assistance;Sit to/from stand;Sitting/lateral leans   Toilet Transfer: Contact guard assist;Ambulation;Rolling walker (2 wheels)   Toileting- Clothing Manipulation and Hygiene: Contact guard assist;Sitting/lateral lean;Sit to/from stand       Functional mobility during ADLs: Contact guard assist;Rolling walker (2 wheels)       Vision Ability to See in Adequate Light: 0 Adequate Patient Visual Report: No change from baseline Vision Assessment?: No apparent visual deficits     Perception         Praxis         Pertinent Vitals/Pain Pain Assessment Pain Assessment: No/denies pain     Extremity/Trunk Assessment Upper Extremity Assessment Upper Extremity Assessment: Generalized weakness;Right hand dominant   Lower Extremity Assessment Lower Extremity Assessment: Defer to PT evaluation   Cervical / Trunk Assessment Cervical / Trunk Assessment: Normal   Communication  Communication Communication: No apparent difficulties   Cognition Arousal: Alert Behavior During Therapy: WFL for tasks assessed/performed Cognition: No family/caregiver present to determine baseline             OT - Cognition Comments: appears WFL,  some anxiety with mobility due to SOB                 Following commands: Intact       Cueing  General Comments   Cueing Techniques: Verbal cues;Gestural cues      Exercises     Shoulder Instructions      Home Living Family/patient expects to be discharged to:: Private residence Living Arrangements: Other relatives (nephew) Available Help at Discharge: Family;Available 24 hours/day Type of Home: House Home Access: Stairs to enter [employer] of Steps: 3 Entrance Stairs-Rails: Can reach both Home Layout: Two level;Able to live on main level with bedroom/bathroom Alternate Level Stairs-Number of Steps: 12 Alternate Level Stairs-Rails: Can reach both Bathroom Shower/Tub: Producer, television/film/video: Standard Bathroom Accessibility: Yes   Home Equipment: Cane - single point;Cane - Programmer, applications (2 wheels);Shower seat          Prior Functioning/Environment Prior Level of Function : Independent/Modified Independent             Mobility Comments: pt uses cane primarily for mobility (hurrycane) ADLs Comments: ind, does not drive    OT Problem List: Decreased strength;Decreased activity tolerance;Impaired balance (sitting and/or standing);Cardiopulmonary status limiting activity   OT Treatment/Interventions: Self-care/ADL training;Therapeutic exercise;Energy conservation;DME and/or AE instruction;Therapeutic activities;Patient/family education;Balance training      OT Goals(Current goals can be found in the care plan section)   Acute Rehab OT Goals Patient Stated Goal: improve breathing OT Goal Formulation: With patient Time For Goal Achievement: 05/30/23 Potential to Achieve Goals: Good   OT Frequency:  Min 1X/week    Co-evaluation              AM-PAC OT "6 Clicks" Daily Activity     Outcome Measure Help from another person eating meals?: None Help from another person taking care of personal grooming?: A Little Help  from another person toileting, which includes using toliet, bedpan, or urinal?: A Little Help from another person bathing (including washing, rinsing, drying)?: A Little Help from another person to put on and taking off regular upper body clothing?: A Little Help from another person to put on and taking off regular lower body clothing?: A Little 6 Click Score: 19   End of Session Equipment Utilized During Treatment: Rolling walker (2 wheels);Oxygen  Activity Tolerance: Patient tolerated treatment well Patient left: in chair;with call bell/phone within reach;with chair alarm set  OT Visit Diagnosis: Unsteadiness on feet (R26.81);Other abnormalities of gait and mobility (R26.89)                Time: 1610-9604 OT Time Calculation (min): 18 min Charges:  OT General Charges $OT Visit: 1 Visit OT Evaluation $OT Eval Moderate Complexity: 1 Mod  Bradd Canary, OTR/L Acute Rehab Services Office: [phone]   [name] 05/16/2023, 12:36 PM

## 2023-05-16 NOTE — Progress Notes (Signed)
 Patient stable for discharge out of ICU. IMTS to assume care 2/26 at 7AM.

## 2023-05-16 NOTE — Progress Notes (Addendum)
 NAME:  Jason Wong, MRN:  [id number], DOB:  [birth year], LOS: 2 ADMISSION DATE:  05/13/2023, CONSULTATION DATE:  05/14/2023 REFERRING MD:  Antony Contras - IMTS, CHIEF COMPLAINT:  dyspnea.    History of Present Illness:  68 year old man who presented with 48 hours of increasing dyspnea.  Increased cough for prior 2 weeks.  Has been out of triple combination inhaler since December.   History of tobacco abuse and COPD with prior intubation.   Initially admitted to IMTS and started on BiPAP, bronchodilators and steroids. Additional concern for alcohol withdrawal.   Pertinent  Medical History   Past Medical History:  Diagnosis Date   Asthma    COPD (chronic obstructive pulmonary disease) (HCC)    Hepatitis C antibody positive in blood    HLD (hyperlipidemia)    HTN (hypertension)    On home oxygen therapy    per pt 08-31-2021 uses 02 about every other day   Significant Hospital Events: Including procedures, antibiotic start and stop dates in addition to other pertinent events   2/23 - admitted to IMTS > tx to ICU for BiPAP, Flu A hypoxia 2/24 off BiPAP and tolerating well.   Interim History / Subjective:  Precedex off since yesterday morning BiPAP off at 3am and tolerating well Still somewhat SOB, but feels better overall Reports he is still smoking Uses his COPD maintenance medications incorrectly.   Objective   Blood pressure 118/88, pulse 60, temperature 97.7 F (36.5 C), temperature source Oral, resp. rate 12, weight 49.9 kg, SpO2 98%.    Vent Mode: BIPAP;PCV FiO2 (%):  [30 %] 30 % Set Rate:  [12 bmp] 12 bmp PEEP:  [5 cmH20] 5 cmH20   Intake/Output Summary (Last 24 hours) at 05/16/2023 0838 Last data filed at 05/16/2023 0600 Gross per 24 hour  Intake 73.86 ml  Output 1050 ml  Net -976.14 ml   Filed Weights   05/14/23 1444 05/15/23 0600  Weight: 55 kg 49.9 kg    Examination:  General: thin adult male in NAD HENT:[place]/AT, PERRL, no JVD. Poor dentition.  Lungs: Clear  bilateral breath sounds. No wheeze.  Cardiovascular: RRR, no MRG, no edema.  Abdomen: Soft, NT, ND Extremities: No acute deformity or ROM limitation.  Neuro: Alert, oriented, non-focal GU: foley in place  Resolved Hospital Problem list     Assessment & Plan:  Acute hypoxic respiratory failure   Influenza A infection. COPD with acute exacerbation - PRN BiPAP for work of breathing - Precedex off DC - Sat goal > 92% - Triple neb therapy - IV steroids - Tamiflu - defer antibiotics - Will need education regarding proper inhaler use at home (not taking trelegy every day and relying on albuterol nebs)  Acute urinary retention with hemorrhagic cystitis. CT hematuria study completed. Study showing diffuse bladder wall thickening. Differential would include hypertrophy, cystitis or infiltrating disease. No signs of UTI on UA.  - foley in place, consider DC  Alcohol use - CIWA protocol  HTN - resume home amlodipine  GERD - PPI  Message sent to our clinic for follow up hopefully sometime next week. He is a patient of Dr. Isaiah Serge.   Best Practice (right click and "Reselect all SmartList Selections" daily)   Diet/type: tubefeeds and NPO w/ meds via tube DVT prophylaxis LMWH Pressure ulcer(s): N/A GI prophylaxis: H2B Lines: N/A Foley:  Yes, and it is still needed Code Status:  full code Last date of multidisciplinary goals of care discussion [pending]    Joneen Roach,  AGACNP-BC  Pulmonary & Critical Care  See Amion for personal pager PCCM on call pager [phone] until 7pm. Please call Elink 7p-7a. [phone]  05/16/2023 8:38 AM

## 2023-05-16 NOTE — Progress Notes (Signed)
 eLink Physician-Brief Progress Note Patient Name: Jason Wong DOB: 03/26/1955 MRN: [id number]   Date of Service  05/16/2023  HPI/Events of Note  Pt requesting for sleep aide. He can take PO.   eICU Interventions  Melatonin ordered at bedtime PRN.     Intervention Category Minor Interventions: Other:  Larinda Buttery 05/16/2023, 10:26 PM

## 2023-05-16 NOTE — Progress Notes (Signed)
   05/16/23 2327  BiPAP/CPAP/SIPAP  Reason BIPAP/CPAP not in use Other(comment) (Bipap order is prn, not indicated at this time)

## 2023-05-16 NOTE — Progress Notes (Signed)
 eLink Physician-Brief Progress Note Patient Name: Jason Wong DOB: 07-26-1955 MRN: [id number]   Date of Service  05/16/2023  HPI/Events of Note  Notified of patient complaint of headache 10/10.   eICU Interventions  Tylenol PRN ordered.     Intervention Category Minor Interventions: Other:  Larinda Buttery 05/16/2023, 12:30 AM

## 2023-05-16 NOTE — Telephone Encounter (Signed)
 I can see 3/4 at 1:30

## 2023-05-17 ENCOUNTER — Other Ambulatory Visit (HOSPITAL_COMMUNITY): Payer: Self-pay

## 2023-05-17 DIAGNOSIS — J441 Chronic obstructive pulmonary disease with (acute) exacerbation: Secondary | ICD-10-CM | POA: Diagnosis not present

## 2023-05-17 DIAGNOSIS — R319 Hematuria, unspecified: Secondary | ICD-10-CM | POA: Insufficient documentation

## 2023-05-17 LAB — BASIC METABOLIC PANEL
Anion gap: 8 (ref 5–15)
BUN: 39 mg/dL — ABNORMAL HIGH (ref 8–23)
CO2: 26 mmol/L (ref 22–32)
Calcium: 8.9 mg/dL (ref 8.9–10.3)
Chloride: 105 mmol/L (ref 98–111)
Creatinine, Ser: 0.83 mg/dL (ref 0.61–1.24)
GFR, Estimated: >60 mL/min (ref >60)
Glucose, Bld: 111 mg/dL — ABNORMAL HIGH (ref 70–99)
Potassium: 4.8 mmol/L (ref 3.5–5.1)
Sodium: 139 mmol/L (ref 135–145)

## 2023-05-17 LAB — GLUCOSE, CAPILLARY
Glucose-Capillary: 136 mg/dL — ABNORMAL HIGH (ref 70–99)
Glucose-Capillary: 118 mg/dL — ABNORMAL HIGH (ref 70–99)

## 2023-05-17 LAB — HEMOGLOBIN A1C
Hgb A1c MFr Bld: 5.4 % (ref 4.8–5.6)
Mean Plasma Glucose: 108.28 mg/dL

## 2023-05-17 MED ORDER — SENNA 8.6 MG PO TABS
1.0000 | ORAL_TABLET | Freq: Every day | ORAL | Status: DC
  Administered 2023-05-17: 8.6 mg via ORAL
  Filled 2023-05-17: qty 1

## 2023-05-17 MED ORDER — FOLIC ACID 1 MG PO TABS
1.0000 mg | ORAL_TABLET | Freq: Every day | ORAL | 0 refills | Status: DC
  Filled 2023-05-17: qty 30, 30d supply, fill #0

## 2023-05-17 MED ORDER — BUDESONIDE 0.25 MG/2ML IN SUSP
0.2500 mg | Freq: Two times a day (BID) | RESPIRATORY_TRACT | 2 refills | Status: DC
  Filled 2023-05-17 (×2): qty 60, 15d supply, fill #0
  Filled 2023-06-28: qty 60, 15d supply, fill #1

## 2023-05-17 MED ORDER — LIDOCAINE 5 % EX PTCH
1.0000 | MEDICATED_PATCH | CUTANEOUS | Status: DC
  Administered 2023-05-17: 1 via TRANSDERMAL
  Filled 2023-05-17: qty 1

## 2023-05-17 MED ORDER — PREDNISONE 20 MG PO TABS
ORAL_TABLET | ORAL | 0 refills | Status: DC
  Filled 2023-05-17: qty 27, 9d supply, fill #0

## 2023-05-17 MED ORDER — IPRATROPIUM-ALBUTEROL 0.5-2.5 (3) MG/3ML IN SOLN
3.0000 mL | Freq: Four times a day (QID) | RESPIRATORY_TRACT | 1 refills | Status: DC | PRN
  Filled 2023-05-17: qty 360, 30d supply, fill #0
  Filled 2023-06-28: qty 360, 30d supply, fill #1

## 2023-05-17 MED ORDER — RIVAROXABAN 10 MG PO TABS
10.0000 mg | ORAL_TABLET | Freq: Every day | ORAL | Status: DC
  Administered 2023-05-17: 10 mg via ORAL
  Filled 2023-05-17: qty 1

## 2023-05-17 MED ORDER — ADULT MULTIVITAMIN W/MINERALS CH
1.0000 | ORAL_TABLET | Freq: Every day | ORAL | 0 refills | Status: DC
  Filled 2023-05-17: qty 30, 30d supply, fill #0

## 2023-05-17 MED ORDER — THIAMINE HCL 100 MG PO TABS
100.0000 mg | ORAL_TABLET | Freq: Every day | ORAL | 0 refills | Status: DC
  Filled 2023-05-17: qty 30, 30d supply, fill #0

## 2023-05-17 MED ORDER — POLYETHYLENE GLYCOL 3350 17 G PO PACK
17.0000 g | PACK | Freq: Every day | ORAL | Status: DC
  Administered 2023-05-17: 17 g via ORAL
  Filled 2023-05-17: qty 1

## 2023-05-17 MED ORDER — PANTOPRAZOLE SODIUM 40 MG PO TBEC
40.0000 mg | DELAYED_RELEASE_TABLET | Freq: Every day | ORAL | 0 refills | Status: DC
  Filled 2023-05-17: qty 30, 30d supply, fill #0

## 2023-05-17 MED ORDER — ARFORMOTEROL TARTRATE 15 MCG/2ML IN NEBU
15.0000 ug | INHALATION_SOLUTION | Freq: Two times a day (BID) | RESPIRATORY_TRACT | 2 refills | Status: DC
  Filled 2023-05-17: qty 120, 30d supply, fill #0
  Filled 2023-05-17: qty 60, 30d supply, fill #0

## 2023-05-17 MED ORDER — OSELTAMIVIR PHOSPHATE 75 MG PO CAPS
75.0000 mg | ORAL_CAPSULE | Freq: Two times a day (BID) | ORAL | 0 refills | Status: DC
  Filled 2023-05-17: qty 4, 2d supply, fill #0

## 2023-05-17 MED ORDER — ALUM & MAG HYDROXIDE-SIMETH 200-200-20 MG/5ML PO SUSP
30.0000 mL | Freq: Four times a day (QID) | ORAL | Status: DC | PRN
  Administered 2023-05-17: 30 mL via ORAL
  Filled 2023-05-17: qty 30

## 2023-05-17 MED ORDER — PREDNISONE 20 MG PO TABS
50.0000 mg | ORAL_TABLET | Freq: Two times a day (BID) | ORAL | Status: DC
  Administered 2023-05-17: 50 mg via ORAL
  Filled 2023-05-17: qty 1

## 2023-05-17 NOTE — Discharge Summary (Cosign Needed Addendum)
 Name: Jason Wong MRN: [id number] DOB: November 09, 1955 68 y.o. PCP: Marcine Matar, MD  Date of Admission: 05/13/2023  2:41 PM Date of Discharge:  05/17/2023 Attending Physician: Dr. Antony Contras  DISCHARGE DIAGNOSIS:  Primary Problem: COPD with acute exacerbation [facility])   Hospital Problems: Principal Problem:   COPD with acute exacerbation (HCC) Active Problems:   ETOH abuse   Acute respiratory failure with hypoxia (HCC)   Tobacco use   Influenza A   Protein-calorie malnutrition (HCC)   Urinary retention due to benign prostatic hyperplasia   Cystitis   Acute on chronic respiratory failure with hypoxia (HCC)   Hematuria    DISCHARGE MEDICATIONS:   Allergies as of 05/17/2023       Reactions   Tobacco Shortness Of Breath, Other (See Comments)   CIGARETTE SMOKE TRIGGERS THE PATIENT'S RESPIRATORY ISSUES!!        Medication List     STOP taking these medications    albuterol (2.5 MG/3ML) 0.083% nebulizer solution Commonly known as: PROVENTIL   albuterol 108 (90 Base) MCG/ACT inhaler Commonly known as: VENTOLIN HFA   Breo Ellipta 200-25 MCG/ACT Aepb Generic drug: fluticasone furoate-vilanterol   famotidine 20 MG tablet Commonly known as: PEPCID       TAKE these medications    acetaminophen 500 MG tablet Commonly known as: TYLENOL Take 500 mg by mouth as needed for mild pain (pain score 1-3) or headache.   amLODipine 5 MG tablet Commonly known as: NORVASC Take 1 tablet (5 mg total) by mouth daily.   atorvastatin 10 MG tablet Commonly known as: LIPITOR Take 1 tablet (10 mg total) by mouth daily.   budesonide 0.25 MG/2ML nebulizer solution Commonly known as: PULMICORT Take 2 mLs (0.25 mg total) by nebulization 2 (two) times daily.   folic acid 1 MG tablet Commonly known as: FOLVITE Take 1 tablet (1 mg total) by mouth daily. Start taking on: May 18, 2023   ipratropium-albuterol 0.5-2.5 (3) MG/3ML Soln Commonly known as: DUONEB Take 3 mLs by  nebulization every 6 (six) hours as needed.   multivitamin with minerals Tabs tablet Take 1 tablet by mouth daily. Start taking on: May 18, 2023   oseltamivir 75 MG capsule Commonly known as: TAMIFLU Take 1 capsule (75 mg total) by mouth 2 (two) times daily.   pantoprazole 40 MG tablet Commonly known as: PROTONIX Take 1 tablet (40 mg total) by mouth daily. Start taking on: May 18, 2023   predniSONE 20 MG tablet Commonly known as: DELTASONE Take 2 tablets (40 mg total) by mouth 2 (two) times daily with a meal for 3 days, THEN 3 tablets (60 mg total) daily with breakfast for 3 days, THEN 2 tablets (40 mg total) daily with breakfast for 3 days. Start taking on: May 17, 2023 What changed: See the new instructions.   thiamine 100 MG tablet Commonly known as: Vitamin B-1 Take 1 tablet (100 mg total) by mouth daily. Start taking on: May 18, 2023   Tiotropium Bromide-Olodaterol 2.5-2.5 MCG/ACT Aers Inhale 2 puffs into the lungs daily.        DISPOSITION AND FOLLOW-UP:  Jason Wong was discharged from [facility] in [place] condition. At the hospital follow up visit please address:  Follow-up Recommendations: Consults: Urology referral placed in setting of hematuria Labs: Basic Metabolic Profile Studies: None Medications: Please follow up to make sure patient has taken their steroid taper.   Follow-up Appointments: Please call your primary care doctor in order to follow up with  them. We will place an order for you to see the Urology to follow on the blood you had in your urine.   HOSPITAL COURSE:  Patient Summary: COPD exacerbation Influenza A Poorly controlled COPD with non adherence to inhalers. Normal CXR on admission. No chest pain. PE wells score of 0. No signs of peripheral congestion or pulmonary edema to suggest cardiac etiology.  He was placed on BiPAP with some relief of his symptoms, but was not feeling well. Required ICU  admission for close monitoring and sedation while on BiPAP, but did not require intubation. Treated with tamiflu, inhalers, nebulizers, and prednisone. Follow ICU step down, ambulated without desaturation on room air and was medically cleared to go home.    Abdominal Pain Began on 05/17/2023, with left upper quadrant / epigastric abdominal pain. Worse with movement. Positive Carnett's sign. No rebound or guarding. Will treat with tylenol and continue PPI.  Elevated lactic acid - Resolved Without metabolic acidosis - Resolved Resolved with fluids   BPH Urinary retention Hematuria Foley catheter placed, CT hematuria study completed. Study showing diffuse bladder wall thickening. Differential would include hypertrophy, cystitis or infiltrating disease. No signs of UTI on UA. Will benefit from Urology follow up - Amb Ref to Urology   Concern for alcohol use disorder Substance use No documented or subjective history of alcohol withdrawal, seizures or DT.  Received CIWA triggered ativan but eventually required ICU admission for worsening RF and sedation.  - UDS cocaine +, SUD resources given - Counsel cessation at discharge   Macrocytosis without anemia Stable Hgb. Likely in the setting of chronic alcohol use. - Supplementing Folate - B12 levels wnl   HTN - Continue amlodipine  HLD - Continue lipitor    GERD - Continue pantoprazole   DISCHARGE INSTRUCTIONS:   Discharge Instructions     Ambulatory referral to Urology   Complete by: As directed    Call MD for:  difficulty breathing, headache or visual disturbances   Complete by: As directed    Call MD for:  extreme fatigue   Complete by: As directed    Call MD for:  persistant dizziness or light-headedness   Complete by: As directed    Call MD for:  persistant nausea and vomiting   Complete by: As directed    Call MD for:  severe uncontrolled pain   Complete by: As directed    Call MD for:  temperature >100.4   Complete by:  As directed    Diet - low sodium heart healthy   Complete by: As directed    Increase activity slowly   Complete by: As directed    No wound care   Complete by: As directed        SUBJECTIVE:  Patient's breathing better this AM. Endorses some abdominal pain, likely in the setting of coughing. Exam c/w abdominal muscle pain. No desaturation on ambulation.   Discharge Vitals:   BP 121/85 (BP Location: Left Arm)   Pulse 70   Temp 98.1 F (36.7 C) (Oral)   Resp 20   Ht 5\' 7"  (1.702 m)   Wt 48.4 kg   SpO2 95%   BMI 16.71 kg/m   OBJECTIVE:  Physical Exam Constitutional:      Appearance: He is well-developed.  Cardiovascular:     Rate and Rhythm: Normal rate and regular rhythm.  Pulmonary:     Effort: Pulmonary effort is normal. No accessory muscle usage or respiratory distress.     Breath sounds: Normal  breath sounds. No stridor.  Abdominal:     Tenderness: There is abdominal tenderness.     Comments: Positive Carnett sign. LUQ abdominal ttp. No rebound, no guarding.    Neurological:     Mental Status: He is alert.      Pertinent Labs, Studies, and Procedures:     Latest Ref Rng & Units 05/15/2023    3:53 AM 05/14/2023    9:03 AM 05/14/2023    2:05 AM  CBC  WBC 4.0 - 10.5 K/uL 2.5   2.9   Hemoglobin 13.0 - 17.0 g/dL 96.0  45.4  09.8   Hematocrit 39.0 - 52.0 % 43.5  40.0  39.3   Platelets 150 - 400 K/uL 241   258        Latest Ref Rng & Units 05/17/2023    2:33 AM 05/16/2023    2:06 AM 05/15/2023    3:53 AM  CMP  Glucose 70 - 99 mg/dL 119  147  829   BUN 8 - 23 mg/dL 39  26  10   Creatinine 0.61 - 1.24 mg/dL 5.62  1.30  8.65   Sodium 135 - 145 mmol/L 139  139  139   Potassium 3.5 - 5.1 mmol/L 4.8  4.4  4.7   Chloride 98 - 111 mmol/L 105  101  104   CO2 22 - 32 mmol/L 26  27  24    Calcium 8.9 - 10.3 mg/dL 8.9  9.4  9.1   Total Protein 6.5 - 8.1 g/dL   6.7   Total Bilirubin 0.0 - 1.2 mg/dL   0.3   Alkaline Phos 38 - 126 U/L   57   AST 15 - 41 U/L   22   ALT  0 - 44 U/L   14     DG CHEST PORT 1 VIEW Result Date: 05/14/2023 CLINICAL DATA:  Respiratory failure. EXAM: PORTABLE CHEST 1 VIEW COMPARISON:  May 13, 2023. FINDINGS: The heart size and mediastinal contours are within normal limits. Both lungs are clear. The visualized skeletal structures are unremarkable. IMPRESSION: No active disease. Electronically Signed   By: Lupita Raider M.D.   On: 05/14/2023 14:59   CT HEMATURIA WORKUP Result Date: 05/14/2023 CLINICAL DATA:  230123. Microscopic hematuria. EXAM: CT ABDOMEN AND PELVIS WITHOUT AND WITH CONTRAST TECHNIQUE: Multidetector CT imaging of the abdomen and pelvis was performed following the standard protocol before and following the bolus administration of intravenous contrast. RADIATION DOSE REDUCTION: This exam was performed according to the departmental dose-optimization program which includes automated exposure control, adjustment of the mA and/or kV according to patient size and/or use of iterative reconstruction technique. CONTRAST:  75mL OMNIPAQUE IOHEXOL 350 MG/ML SOLN COMPARISON:  CT with IV contrast 01/17/2023 FINDINGS: Lower chest: Limited due to respiratory motion. No consolidation or effusion. The cardiac size is normal. Hepatobiliary: Loss of fine detail due to breathing motion. Mild hepatic steatosis. 1.1 cm cyst noted in the dome of the liver in segment 2. Hounsfield density is 9.3. No other focal abnormality. Gallbladder and bile ducts unremarkable as well as can be seen allowing for motion. Pancreas: No abnormality is seen through the breathing motion. Spleen: No abnormality is seen through the breathing motion. Adrenals/Urinary Tract: No adrenal or renal mass enhancement. Loss of fine detail due to abundant breathing motion. A few scattered subcentimeter too small to characterize Bosniak 2 cysts are again noted. No follow-up imaging is recommended. No urinary stone or obstruction is seen. The ureters are mildly  distended but there is no  hydronephrosis. There is symmetric renal excretion in the pyelographic phase of the study. No collecting system or ureteral filling defect is seen. The bladder is catheterized. Although contracted, the bladder does appear diffusely thickened with a contracted bladder wall 1.7 cm. Differential diagnosis is mainly between hypertrophy, cystitis and infiltrating disease. Further evaluation recommended. This was not seen previously. Again, nodular median prostatic lobe hypertrophy does protrudes into the bladder base which was seen previously. Stomach/Bowel: Limited visualization of the stomach due to breathing motion. Elsewhere no dilatation or wall thickening including the appendix. Mild-to-moderate fecal stasis. Vascular/Lymphatic: Aortic atherosclerosis. No enlarged abdominal or pelvic lymph nodes. Reproductive: Propranolol limit of normal size of the prostate with again noted median lobe hypertrophy protruding into the posterior bladder. Unremarkable seminal vesicles. Other: Minimal ascites in the posterior deep pelvis. No drainable ascites. No free hemorrhage, free air, abscess or other localizing collection. There is no incarcerated hernia. Musculoskeletal: Chronic appearance of osteonecrosis both superior femoral heads. Currently no articular surface subsidence is seen. Nonspecific marrow heterogeneity in the sacrum but no destructive process. No acute or other significant osseous findings. IMPRESSION: 1. Catheterized bladder with diffuse bladder wall thickening. Differential diagnosis is mainly between hypertrophy, cystitis and infiltrating disease. Further evaluation recommended. 2. No urinary stone or obstruction is seen. No collecting system or ureteral filling defect is seen in the posterior excretion images. 3. Constipation. 4. Minimal ascites in the posterior deep pelvis. 5. Aortic atherosclerosis. 6. Chronic osteonecrosis both superior femoral heads. 7. Nonspecific sacral marrow heterogeneity. Aortic  Atherosclerosis (ICD10-I70.0). Electronically Signed   By: Almira Bar M.D.   On: 05/14/2023 04:52   DG CHEST PORT 1 VIEW Result Date: 05/13/2023 CLINICAL DATA:  Shortness of breath EXAM: PORTABLE CHEST 1 VIEW COMPARISON:  05/13/2023 FINDINGS: Heart and mediastinal contours are within normal limits. No focal opacities or effusions. No acute bony abnormality. Previously described hazy round opacity over the left lower lung no longer visualized. IMPRESSION: No active disease. Electronically Signed   By: Charlett Nose M.D.   On: 05/13/2023 23:23   DG Chest Port 1 View Result Date: 05/13/2023 CLINICAL DATA:  Shortness of breath and cough EXAM: PORTABLE CHEST 1 VIEW COMPARISON:  Chest radiograph dated 05/11/2023 FINDINGS: Normal lung volumes. Hazy rounded opacity in the left lower lung may represent nipple shadow. No pleural effusion or pneumothorax. The heart size and mediastinal contours are within normal limits. No acute osseous abnormality. IMPRESSION: Hazy rounded opacity in the left lower lung may represent nipple shadow. Recommend repeat chest radiograph with nipple markers. Otherwise, no focal consolidations. Electronically Signed   By: Agustin Cree M.D.   On: 05/13/2023 16:02     Signed: Lovie Macadamia MD Internal Medicine Resident, PGY-1 [facility]  Pager: [phone] 2:35 PM, 05/17/2023

## 2023-05-17 NOTE — Progress Notes (Signed)
 Physical Therapy Treatment Patient Details Name: Jason Wong MRN: [id number] DOB: Dec 29, 1955 Today's Date: 05/17/2023   History of Present Illness 68 y/o male admitted 2/22 with dyspnea, tachypnea, COPD exacerbation requiring Bipap. PMhx: HTN, COPD/asthma, HLD, hep C, ETOH and tobacco use.    PT Comments  Time spent educating pt on the importance of purse lipped breathing when pt feels short of breathe to help decreased respirations and for pt respiratory musculature to relax. Pt reports he feels better. Discussed pacing activities at home if he feels fatigue and pt stated understanding. Pt is Mod I to supervision for all functional activities including 100 ft of gait. Due to pt current functional status, home set up and available assistance at home recommending skilled physical therapy services 3x/week in order to address strength, balance and functional mobility to decrease risk for falls, injury and re-hospitalization.       If plan is discharge home, recommend the following: Help with stairs or ramp for entrance;Assist for transportation     Equipment Recommendations  Rolling walker (2 wheels)       Precautions / Restrictions Precautions Precautions: Fall;Other (comment) Precaution/Restrictions Comments: monitor O2 Restrictions Weight Bearing Restrictions Per Provider Order: No     Mobility  Bed Mobility Overal bed mobility: Modified Independent Bed Mobility: Supine to Sit, Sit to Supine     Supine to sit: Modified independent (Device/Increase time) Sit to supine: Modified independent (Device/Increase time)   General bed mobility comments: increased time, HOB elevated, no physical assist    Transfers Overall transfer level: Modified independent Equipment used: Rolling walker (2 wheels) Transfers: Sit to/from Stand Sit to Stand: Supervision, Modified independent (Device/Increase time)           General transfer comment: initially supervision with verbal cues for safe  hand placement improved on second attempt.    Ambulation/Gait Ambulation/Gait assistance: Supervision Gait Distance (Feet): 100 Feet Assistive device: Rolling walker (2 wheels) Gait Pattern/deviations: Step-through pattern, Decreased stride length Gait velocity: decreased Gait velocity interpretation: 1.31 - 2.62 ft/sec, indicative of limited community ambulator   General Gait Details: short step height and length, verbal cues for breaths through nose/out mouth. SPO2 at 95% on room air. Pt improved with verbal cues for purse lipped breathing stating he felt better.     Balance Overall balance assessment: Needs assistance Sitting-balance support: No upper extremity supported, Feet supported Sitting balance-Leahy Scale: Good     Standing balance support: Bilateral upper extremity supported, During functional activity Standing balance-Leahy Scale: Fair Standing balance comment: no overt LOB      Communication Communication Communication: No apparent difficulties  Cognition Arousal: Alert Behavior During Therapy: WFL for tasks assessed/performed, Anxious   PT - Cognitive impairments: No apparent impairments       Following commands: Intact      Cueing Cueing Techniques: Verbal cues     General Comments General comments (skin integrity, edema, etc.): SPO2 remained in the 90's throughout session on room air. Pt reports he is "wheezing". Audible grunting sound. Pt was cued for purse lipped breathing and improved.      Pertinent Vitals/Pain Pain Assessment Pain Assessment: No/denies pain     PT Goals (current goals can now be found in the care plan section) Acute Rehab PT Goals Patient Stated Goal: improve breathing PT Goal Formulation: With patient Time For Goal Achievement: 05/30/23 Potential to Achieve Goals: Good Progress towards PT goals: Progressing toward goals    Frequency    Min 1X/week      PT  Plan  Continue with current POC        AM-PAC PT "6  Clicks" Mobility   Outcome Measure  Help needed turning from your back to your side while in a flat bed without using bedrails?: None Help needed moving from lying on your back to sitting on the side of a flat bed without using bedrails?: None Help needed moving to and from a bed to a chair (including a wheelchair)?: None Help needed standing up from a chair using your arms (e.g., wheelchair or bedside chair)?: None Help needed to walk in hospital room?: None Help needed climbing 3-5 steps with a railing? : A Little 6 Click Score: 23    End of Session Equipment Utilized During Treatment: Gait belt Activity Tolerance: Patient tolerated treatment well Patient left: in bed;with call bell/phone within reach Nurse Communication: Mobility status PT Visit Diagnosis: Unsteadiness on feet (R26.81);Muscle weakness (generalized) (M62.81)     Time: 6045-4098 PT Time Calculation (min) (ACUTE ONLY): 15 min  Charges:    $Therapeutic Activity: 8-22 mins PT General Charges $$ ACUTE PT VISIT: 1 Visit                     Harrel Carina, DPT, CLT  Acute Rehabilitation Services Office: [phone] (Secure chat preferred)    Claudia Desanctis 05/17/2023, 3:34 PM

## 2023-05-17 NOTE — Plan of Care (Signed)

## 2023-05-17 NOTE — TOC Initial Note (Addendum)
 Transition of Care [facility]) - Initial/Assessment Note    Patient Details  Name: Jason Wong MRN: [id number] Date of Birth: [birth year]  [facility]) CM/SW Contact:    Marliss Coots, LCSW Phone Number: 05/17/2023, 9:24 AM  Clinical Narrative:                  9:25 AM Per chart review, patient has SDOH needs (transportation). CSW added SDOH resources to patient's AVS. ED CSW added substance use resources to patient's AVS, as well.  Expected Discharge Plan: Home w Home Health Services Barriers to Discharge: Continued Medical Work up   Patient Goals and CMS Choice            Expected Discharge Plan and Services In-house Referral: Clinical Social Work   Post Acute Care Choice: Home Health Living arrangements for the past 2 months: Single Family Home                                      Prior Living Arrangements/Services Living arrangements for the past 2 months: Single Family Home Lives with:: Relatives Patient language and need for interpreter reviewed:: Yes              Criminal Activity/Legal Involvement Pertinent to Current Situation/Hospitalization: No - Comment as needed  Activities of Daily Living   ADL Screening (condition at time of admission) Independently performs ADLs?: Yes (appropriate for developmental age) Is the patient deaf or have difficulty hearing?: No Does the patient have difficulty seeing, even when wearing glasses/contacts?: No Does the patient have difficulty concentrating, remembering, or making decisions?: No  Permission Sought/Granted Permission sought to share information with : Family Supports Permission granted to share information with : No (Contact information on chart)  Share Information with NAME: Lenna Sciara     Permission granted to share info w Relationship: Significant Other  Permission granted to share info w Contact Information: [phone]  Emotional Assessment       Orientation: : Oriented  to Self, Oriented to  Time, Oriented to Place, Oriented to Situation Alcohol / Substance Use: Not Applicable Psych Involvement: No (comment)  Admission diagnosis:  Influenza A [J10.1] COPD exacerbation (HCC) [J44.1] Acute respiratory failure with hypoxia (HCC) [J96.01] Patient Active Problem List   Diagnosis Date Noted   Acute on chronic respiratory failure with hypoxia (HCC) 05/16/2023   Cystitis 05/14/2023   Influenza A 05/13/2023   Protein-calorie malnutrition (HCC) 05/13/2023   Urinary retention due to benign prostatic hyperplasia 05/13/2023   Acute respiratory failure with hypoxia (HCC) 02/17/2023   HTN (hypertension) 02/17/2023   HLD (hyperlipidemia) 02/17/2023   Tobacco use 02/17/2023   Acute exacerbation of chronic obstructive pulmonary disease (COPD) (HCC) 02/17/2023   Multifocal pneumonia 01/30/2023   COPD with acute exacerbation (HCC) 01/30/2023   COPD with acute exacerbation (HCC) 06/20/2022   Multifocal pneumonia 06/20/2022   Hypokalemia 06/20/2022   Acute respiratory failure with hypoxia (HCC) 06/20/2022   Polysubstance abuse (HCC) 05/09/2022   On home oxygen therapy 08/31/2021   Malnutrition of moderate degree 06/16/2021   Acute exacerbation of chronic obstructive pulmonary disease (COPD) (HCC) 06/14/2021   ETOH abuse 05/01/2021   Acute hypoxemic respiratory failure (HCC) 04/30/2021   HLD (hyperlipidemia) 04/30/2021   COPD (chronic obstructive pulmonary disease) with emphysema (HCC) 02/19/2021   COPD exacerbation (HCC) 02/18/2021   Memory deficit 04/28/2020   Hepatitis C virus infection cured after antiviral drug therapy  04/27/2018   Enuresis 04/27/2018   Centrilobular emphysema (HCC) 04/27/2018   Seasonal allergic rhinitis 04/27/2018   Tobacco dependence 08/24/2017   Weight loss, unintentional 08/24/2017   Insomnia 06/27/2017   Prediabetes 01/24/2017   Asthma 06/10/2016   Essential hypertension 06/10/2016   Current every day smoker 06/10/2016   PCP:   Marcine Matar, MD Pharmacy:   [facility] [address], [address] [place] [address] Phone: [phone] Fax: [phone]  [facility] [address] [place] [employer] [place] [address] Phone: [phone] Fax: [phone]  [facility] [address] [place] [address] Phone: [phone] Fax: [phone]     Social Drivers of Health (SDOH) Social History: SDOH Screenings   Food Insecurity: No Food Insecurity (05/15/2023)  Housing: Low Risk  (02/17/2023)  Transportation Needs: Unmet Transportation Needs (05/15/2023)  Utilities: Not At Risk (05/15/2023)  Depression (PHQ2-9): Low Risk  (02/01/2023)  Tobacco Use: Low Risk  (05/13/2023)   SDOH Interventions:     Readmission Risk Interventions    06/21/2021    2:22 PM  Readmission Risk Prevention Plan  Transportation Screening Complete  PCP or Specialist Appt within 5-7 Days Complete  Home Care Screening Complete  Medication Review (RN CM) Complete

## 2023-05-17 NOTE — Progress Notes (Signed)
 Pharmacist and student pharmacists rounding with the IMTS/[facility] Service. We have been asked by Joneen Roach, NP in his note of 05/16/23 to address: "Will need education regarding proper inhaler use at home (not taking trelegy every day and relying on albuterol nebs) ."  I was physically present during the key portions of the student pharmacists' provided service and participated in providing the patient education as requested.   Printout on "Fluticasone; vilanterol dry powder inhaler" (Breo Ellipta) was used as a Special educational needs teacher. Patient was instructed to never use the [facility] for an acute asthma attack, but instead, use the short-acting rescue inhaler (albuterol) for an acute asthma attack.   Patient was given an opportunity to ask and have answered any questions. Patient was the sole learner in the room and accepted our explanation and repeated our instructions by teach-back and verbalized his understanding of the same.   Hulen Luster, PharmD, CPP

## 2023-05-17 NOTE — TOC Transition Note (Signed)
 Transition of Care [facility]) - Discharge Note   Patient Details  Name: Jason Wong MRN: [id number] Date of Birth: 11/24/1955  Transition [facility]) CM/SW Contact:  Harriet Masson, RN Phone Number: 05/17/2023, 3:40 PM   Clinical Narrative:    Spoke to patient regarding transition needs.  Patient lives with nephew and uses walker. Patient has used [facility] home health in the past. Elnita Maxwell with [facility] declined referral due to insurance.  This RNCM offered choice for Home Health, Patient states he has no preference since [facility] can't accept, RNCM made referral to [facility] with Frances Furbish, He is able to take referral.  Patient takes bus to apts and requesting bus pass for discharge.     Final next level of care: Home w Home Health Services Barriers to Discharge: Barriers Resolved   Patient Goals and CMS Choice Patient states their goals for this hospitalization and ongoing recovery are:: return home CMS Medicare.gov Compare Post Acute Care list provided to:: Patient Choice offered to / list presented to : Patient      Discharge Placement               home        Discharge Plan and Services Additional resources added to the After Visit Summary for   In-house Referral: Clinical Social Work Discharge Planning Services: CM Consult Post Acute Care Choice: Home Health                    HH Arranged: OT, PT [facility] Agency: [facility] Date [facility] Agency Contacted: 05/17/23 Time HH Agency Contacted: 1540 Representative spoke with at [facility] Agency: Kandee Keen  Social Drivers of Health (SDOH) Interventions SDOH Screenings   Food Insecurity: No Food Insecurity (05/15/2023)  Housing: Low Risk  (02/17/2023)  Transportation Needs: Unmet Transportation Needs (05/15/2023)  Utilities: Not At Risk (05/15/2023)  Depression (PHQ2-9): Low Risk  (02/01/2023)  Tobacco Use: Low Risk  (05/13/2023)     Readmission Risk Interventions    06/21/2021    2:22 PM  Readmission Risk Prevention Plan   Transportation Screening Complete  PCP or Specialist Appt within 5-7 Days Complete  Home Care Screening Complete  Medication Review (RN CM) Complete

## 2023-05-18 ENCOUNTER — Telehealth: Payer: Self-pay

## 2023-05-18 NOTE — Transitions of Care (Post Inpatient/ED Visit) (Signed)
   05/18/2023  Name: Jason Wong MRN: [id number] DOB: 1955/07/12  Today's TOC FU Call Status: Today's TOC FU Call Status:: Unsuccessful Call (1st Attempt)  Attempted to reach the patient regarding the most recent Inpatient/ED visit.  Follow Up Plan: Additional outreach attempts will be made to reach the patient to complete the Transitions of Care (Post Inpatient/ED visit) call.   Ari Bernabei A. Mliss Fritz RN, BA, [facility], CRRN [facility] RN Care Manager, Transition of Care [phone]

## 2023-05-18 NOTE — Transitions of Care (Post Inpatient/ED Visit) (Signed)
   05/18/2023  Name: Jason Wong MRN: [id number] DOB: 1956/03/16  Today's TOC FU Call Status: Today's TOC FU Call Status:: Unsuccessful Call (1st Attempt) Unsuccessful Call (1st Attempt) Date: 05/18/23  Attempted to reach the patient regarding the most recent Inpatient/ED visit.  Follow Up Plan: Additional outreach attempts will be made to reach the patient to complete the Transitions of Care (Post Inpatient/ED visit) call.   Signature  Robyne Peers, RN

## 2023-05-18 NOTE — Consult Note (Signed)
 [facility] Liaison Consult Note   05/18/2023  Tieler Cournoyer [birth year] [id number]  Insurance: [employer]  Primary Care Provider: Marcine Matar, MD [facility],  this provider is listed for the transition of care follow up appointments  and [facility] calls   [facility] Liaison screened the patient remotely at [facility]. Patient transitioned home last evening.  Called phone number listed and  phone service is not accepting calls   The patient was screened for hospitalization from ICU to progressive LOC with noted extreme risk score for unplanned readmission risk 6 ED visit and 3 hospital admissions in 6 months.  The patient was assessed for potential [facility] Coordination service needs for post hospital transition for care coordination. Review of patient's electronic medical record reveals patient was transitioned home with [facility]. Reviewed transportation needs and inpatient notes that patient uses the bus system to get to appointments.  Plan:   Referral request for community care coordination: Anticipate TOC follow up with PCP, was unable to reach patient. Also, phone number listed in DPR is no longer in service.   [facility] Community Care, Population Health does not replace or interfere with any arrangements made by the Inpatient Transition of Care team.   For questions contact:   Charlesetta Shanks, RN, BSN, CCM [place]  [facility], [facility] Liaison Direct Dial: [phone] or secure chat Email: [email or web address]

## 2023-05-19 ENCOUNTER — Telehealth: Payer: Self-pay

## 2023-05-19 NOTE — Transitions of Care (Post Inpatient/ED Visit) (Signed)
   05/19/2023  Name: Jason Wong MRN: [id number] DOB: Oct 13, 1955  Today's TOC FU Call Status: Today's TOC FU Call Status:: Unsuccessful Call (2nd Attempt) Unsuccessful Call (2nd Attempt) Date: 05/19/23  Attempted to reach the patient regarding the most recent Inpatient/ED visit.  Follow Up Plan: Additional outreach attempts will be made to reach the patient to complete the Transitions of Care (Post Inpatient/ED visit) call.   Shelah Heatley A. Mliss Fritz RN, BA, [facility], CRRN [facility] RN Care Manager, Transition of Care [phone]

## 2023-05-22 ENCOUNTER — Telehealth: Payer: Self-pay

## 2023-05-22 NOTE — Transitions of Care (Post Inpatient/ED Visit) (Signed)
   05/22/2023  Name: Jason Wong MRN: [id number] DOB: 02-12-1956  Today's TOC FU Call Status: Today's TOC FU Call Status:: Unsuccessful Call (3rd Attempt) Unsuccessful Call (3rd Attempt) Date: 05/22/23  Attempted to reach the patient regarding the most recent Inpatient/ED visit.  Follow Up Plan: No further outreach attempts will be made at this time. We have been unable to contact the patient.  [name] A. Mliss Fritz RN, BA, [facility], CRRN [facility] RN Care Manager, Transition of Care [phone]

## 2023-06-01 ENCOUNTER — Ambulatory Visit: Payer: Medicare Other | Admitting: Internal Medicine

## 2023-06-11 IMAGING — DX DG CHEST 1V PORT
1 series · 1 of 1 positions shown · non-contrast
Comparison: PA Lat 05/07/2021.

CLINICAL DATA: Dyspnea and coughing.

EXAM:
PORTABLE CHEST 1 VIEW

[chest ap]
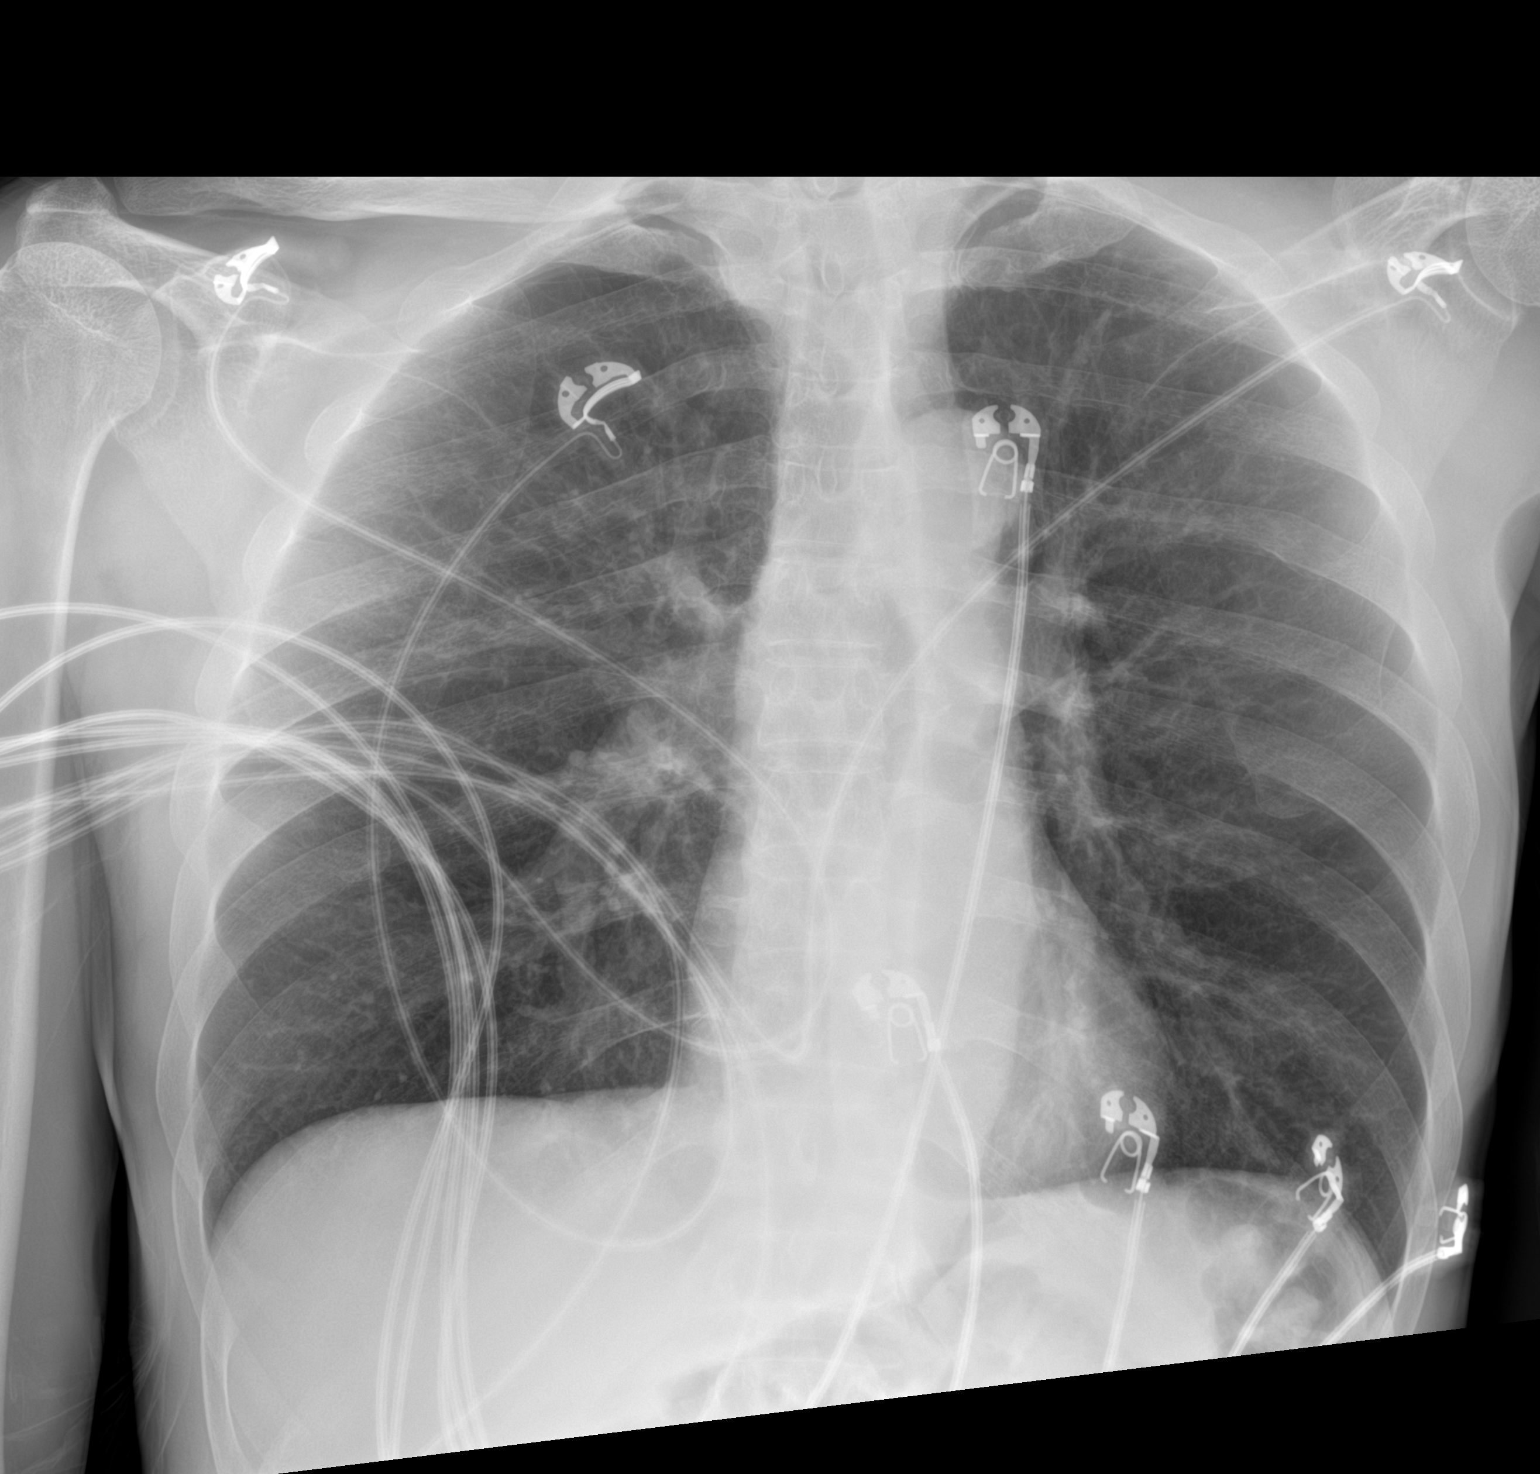

[1 of 1 positions shown; findings below may reference images not displayed]

FINDINGS: Heart size and vasculature are normal aside from aortic
atherosclerosis. The mediastinum is stable. The lungs are mildly
emphysematous but clear. No pleural effusion is seen. No acute
osseous findings with mild osteopenia.
IMPRESSION: No active disease.  Stable COPD chest.  Aortic atherosclerosis.

## 2023-06-28 ENCOUNTER — Other Ambulatory Visit: Payer: Self-pay

## 2023-06-28 ENCOUNTER — Other Ambulatory Visit: Payer: Self-pay | Admitting: Internal Medicine

## 2023-06-28 DIAGNOSIS — E7800 Pure hypercholesterolemia, unspecified: Secondary | ICD-10-CM

## 2023-06-28 DIAGNOSIS — J432 Centrilobular emphysema: Secondary | ICD-10-CM

## 2023-06-28 MED ORDER — ATORVASTATIN CALCIUM 10 MG PO TABS
10.0000 mg | ORAL_TABLET | Freq: Every day | ORAL | 0 refills | Status: DC
  Filled 2023-06-28: qty 30, 30d supply, fill #0

## 2023-06-28 MED ORDER — FOLIC ACID 1 MG PO TABS
1.0000 mg | ORAL_TABLET | Freq: Every day | ORAL | 0 refills | Status: DC
  Filled 2023-06-28: qty 30, 30d supply, fill #0

## 2023-06-28 MED ORDER — PREDNISONE 20 MG PO TABS
40.0000 mg | ORAL_TABLET | Freq: Every day | ORAL | 0 refills | Status: AC
Start: 2023-06-28 — End: 2023-07-01
  Filled 2023-06-28: qty 6, 3d supply, fill #0

## 2023-06-28 MED ORDER — PANTOPRAZOLE SODIUM 40 MG PO TBEC
40.0000 mg | DELAYED_RELEASE_TABLET | Freq: Every day | ORAL | 0 refills | Status: DC
  Filled 2023-06-28: qty 30, 30d supply, fill #0

## 2023-06-28 MED ORDER — ADULT MULTIVITAMIN W/MINERALS CH
1.0000 | ORAL_TABLET | Freq: Every day | ORAL | 0 refills | Status: DC
  Filled 2023-06-28: qty 30, 30d supply, fill #0

## 2023-06-28 MED ORDER — ALBUTEROL SULFATE HFA 108 (90 BASE) MCG/ACT IN AERS
2.0000 | INHALATION_SPRAY | Freq: Four times a day (QID) | RESPIRATORY_TRACT | 0 refills | Status: DC | PRN
  Filled 2023-06-28: qty 8.5, 25d supply, fill #0

## 2023-06-28 MED ORDER — FAMOTIDINE 20 MG PO TABS
20.0000 mg | ORAL_TABLET | Freq: Two times a day (BID) | ORAL | 0 refills | Status: DC
  Filled 2023-06-28: qty 60, 30d supply, fill #0

## 2023-06-29 ENCOUNTER — Other Ambulatory Visit: Payer: Self-pay

## 2023-07-07 ENCOUNTER — Other Ambulatory Visit: Payer: Self-pay

## 2023-07-10 ENCOUNTER — Other Ambulatory Visit: Payer: Self-pay

## 2023-07-20 ENCOUNTER — Emergency Department (HOSPITAL_COMMUNITY)
Admission: EM | Admit: 2023-07-20 | Discharge: 2023-07-20 | Disposition: A | Discharge status: Home / Self Care | Attending: Emergency Medicine | Admitting: Emergency Medicine

## 2023-07-20 ENCOUNTER — Emergency Department (HOSPITAL_COMMUNITY)

## 2023-07-20 ENCOUNTER — Encounter (HOSPITAL_COMMUNITY): Payer: Self-pay

## 2023-07-20 DIAGNOSIS — Z79899 Other long term (current) drug therapy: Secondary | ICD-10-CM | POA: Diagnosis not present

## 2023-07-20 DIAGNOSIS — J45909 Unspecified asthma, uncomplicated: Secondary | ICD-10-CM | POA: Insufficient documentation

## 2023-07-20 DIAGNOSIS — I10 Essential (primary) hypertension: Secondary | ICD-10-CM | POA: Insufficient documentation

## 2023-07-20 DIAGNOSIS — R0789 Other chest pain: Secondary | ICD-10-CM | POA: Diagnosis not present

## 2023-07-20 DIAGNOSIS — Z7951 Long term (current) use of inhaled steroids: Secondary | ICD-10-CM | POA: Insufficient documentation

## 2023-07-20 DIAGNOSIS — J432 Centrilobular emphysema: Secondary | ICD-10-CM

## 2023-07-20 DIAGNOSIS — R079 Chest pain, unspecified: Secondary | ICD-10-CM | POA: Diagnosis not present

## 2023-07-20 DIAGNOSIS — J449 Chronic obstructive pulmonary disease, unspecified: Secondary | ICD-10-CM | POA: Insufficient documentation

## 2023-07-20 LAB — BASIC METABOLIC PANEL WITH GFR
Anion gap: 12 (ref 5–15)
BUN: 12 mg/dL (ref 8–23)
CO2: 21 mmol/L — ABNORMAL LOW (ref 22–32)
Calcium: 9.6 mg/dL (ref 8.9–10.3)
Chloride: 110 mmol/L (ref 98–111)
Creatinine, Ser: 1.04 mg/dL (ref 0.61–1.24)
GFR, Estimated: >60 mL/min (ref >60)
Glucose, Bld: 91 mg/dL (ref 70–99)
Potassium: 3.8 mmol/L (ref 3.5–5.1)
Sodium: 143 mmol/L (ref 135–145)

## 2023-07-20 LAB — CBC
HCT: 41.6 % (ref 39.0–52.0)
Hemoglobin: 13.6 g/dL (ref 13.0–17.0)
MCH: 32.9 pg (ref 26.0–34.0)
MCHC: 32.7 g/dL (ref 30.0–36.0)
MCV: 100.7 fL — ABNORMAL HIGH (ref 80.0–100.0)
Platelets: 337 10*3/uL (ref 150–400)
RBC: 4.13 MIL/uL — ABNORMAL LOW (ref 4.22–5.81)
RDW: 15.3 % (ref 11.5–15.5)
WBC: 7.2 10*3/uL (ref 4.0–10.5)
nRBC: 0 % (ref 0.0–0.2)

## 2023-07-20 LAB — TROPONIN I (HIGH SENSITIVITY)
Troponin I (High Sensitivity): 4 ng/L (ref <18)
Troponin I (High Sensitivity): 5 ng/L (ref <18)

## 2023-07-20 MED ORDER — ACETAMINOPHEN 500 MG PO TABS
1000.0000 mg | ORAL_TABLET | Freq: Once | ORAL | Status: AC
  Administered 2023-07-20: 1000 mg via ORAL
  Filled 2023-07-20: qty 2

## 2023-07-20 MED ORDER — BUDESONIDE 0.25 MG/2ML IN SUSP
0.2500 mg | Freq: Two times a day (BID) | RESPIRATORY_TRACT | 2 refills | Status: DC
  Filled 2023-07-20: qty 60, 15d supply, fill #0

## 2023-07-20 MED ORDER — OXYCODONE HCL 5 MG PO TABS
5.0000 mg | ORAL_TABLET | Freq: Once | ORAL | Status: AC
  Administered 2023-07-20: 5 mg via ORAL
  Filled 2023-07-20: qty 1

## 2023-07-20 MED ORDER — ARFORMOTEROL TARTRATE 15 MCG/2ML IN NEBU
15.0000 ug | INHALATION_SOLUTION | Freq: Two times a day (BID) | RESPIRATORY_TRACT | 2 refills | Status: DC
  Filled 2023-07-20: qty 120, 30d supply, fill #0

## 2023-07-20 MED ORDER — ALBUTEROL SULFATE HFA 108 (90 BASE) MCG/ACT IN AERS
2.0000 | INHALATION_SPRAY | Freq: Four times a day (QID) | RESPIRATORY_TRACT | Status: DC
  Administered 2023-07-20: 2 via RESPIRATORY_TRACT
  Filled 2023-07-20: qty 6.7

## 2023-07-20 MED ORDER — ALBUTEROL SULFATE HFA 108 (90 BASE) MCG/ACT IN AERS
2.0000 | INHALATION_SPRAY | Freq: Four times a day (QID) | RESPIRATORY_TRACT | 0 refills | Status: DC | PRN
  Filled 2023-07-20: qty 6.7, 25d supply, fill #0

## 2023-07-20 NOTE — Discharge Instructions (Addendum)
 We evaluated you for your chest pain.  Your testing in the emergency department is reassuring.  We have placed a referral for a cardiologist.  We have also refilled your home inhaler since you are out.  Please follow-up closely with your primary doctor as well.  Return for any worsening symptoms.

## 2023-07-20 NOTE — ED Provider Notes (Signed)
 [place] EMERGENCY DEPARTMENT AT [facility] Provider Note  CSN: [id number] Arrival date & time: 07/20/23 1345  Chief Complaint(s) Chest Pain  HPI Anchor Huisinga is a 68 y.o. male history of COPD, hypertension, hyperlipidemia presenting to the emergency department with chest pain.  Patient reports chest pain with cough, also worse with movement.  Reports it is worse if he gets up and moves around the bed.  Not pleuritic.  No fevers or chills.  No nausea or vomiting.  Reports that he is out of all of his inhalers and needs refills of all of his inhalers.  Not coughing up any phlegm.  Reports chronic cough.  No back pain.  No abdominal pain.   Past Medical History Past Medical History:  Diagnosis Date   Asthma    COPD (chronic obstructive pulmonary disease) (HCC)    Hepatitis C antibody positive in blood    HLD (hyperlipidemia)    HTN (hypertension)    On home oxygen  therapy    per pt 08-31-2021 uses 02 about every other day   Patient Active Problem List   Diagnosis Date Noted   Hematuria 05/17/2023   Acute on chronic respiratory failure with hypoxia (HCC) 05/16/2023   Cystitis 05/14/2023   Influenza A 05/13/2023   Protein-calorie malnutrition (HCC) 05/13/2023   Urinary retention due to benign prostatic hyperplasia 05/13/2023   Acute respiratory failure with hypoxia (HCC) 02/17/2023   HTN (hypertension) 02/17/2023   HLD (hyperlipidemia) 02/17/2023   Tobacco use 02/17/2023   Acute exacerbation of chronic obstructive pulmonary disease (COPD) (HCC) 02/17/2023   Multifocal pneumonia 01/30/2023   COPD with acute exacerbation (HCC) 01/30/2023   COPD with acute exacerbation (HCC) 06/20/2022   Multifocal pneumonia 06/20/2022   Hypokalemia 06/20/2022   Acute respiratory failure with hypoxia (HCC) 06/20/2022   Polysubstance abuse (HCC) 05/09/2022   On home oxygen  therapy 08/31/2021   Malnutrition of moderate degree 06/16/2021   Acute exacerbation of chronic obstructive  pulmonary disease (COPD) (HCC) 06/14/2021   ETOH abuse 05/01/2021   Acute hypoxemic respiratory failure (HCC) 04/30/2021   HLD (hyperlipidemia) 04/30/2021   COPD (chronic obstructive pulmonary disease) with emphysema (HCC) 02/19/2021   COPD exacerbation (HCC) 02/18/2021   Memory deficit 04/28/2020   Hepatitis C virus infection cured after antiviral drug therapy 04/27/2018   Enuresis 04/27/2018   Centrilobular emphysema (HCC) 04/27/2018   Seasonal allergic rhinitis 04/27/2018   Tobacco dependence 08/24/2017   Weight loss, unintentional 08/24/2017   Insomnia 06/27/2017   Prediabetes 01/24/2017   Asthma 06/10/2016   Essential hypertension 06/10/2016   Current every day smoker 06/10/2016   Home Medication(s) Prior to Admission medications   Medication Sig Start Date End Date Taking? Authorizing Provider  acetaminophen  (TYLENOL ) 500 MG tablet Take 500 mg by mouth as needed for mild pain (pain score 1-3) or headache.    [provider]  albuterol  (VENTOLIN  HFA) 108 (90 Base) MCG/ACT inhaler Inhale 2 puffs into the lungs every 6 (six) hours as needed for wheezing or shortness of breath. 07/20/23   Mordecai Applebaum, MD  amLODipine  (NORVASC ) 5 MG tablet Take 1 tablet (5 mg total) by mouth daily. 06/27/22   Fleming, Zelda W, NP  arformoterol  (BROVANA ) 15 MCG/2ML NEBU Inhale 2 mLs (15 mcg total) into the lungs 2 (two) times daily. 07/20/23   Mordecai Applebaum, MD  atorvastatin  (LIPITOR) 10 MG tablet Take 1 tablet (10 mg total) by mouth daily. 06/28/23   Lawrance Presume, MD  budesonide  (PULMICORT ) 0.25 MG/2ML nebulizer  solution Take 2 mLs (0.25 mg total) by nebulization 2 (two) times daily. 07/20/23 07/19/24  Mordecai Applebaum, MD  famotidine  (PEPCID ) 20 MG tablet Take 1 tablet (20 mg total) by mouth 2 (two) times daily. 06/28/23   Lawrance Presume, MD  folic acid  (FOLVITE ) 1 MG tablet Take 1 tablet (1 mg total) by mouth daily. 06/28/23   Lawrance Presume, MD  ipratropium-albuterol  (DUONEB)  0.5-2.5 (3) MG/3ML SOLN Take 3 mLs by nebulization every 6 (six) hours as needed. 05/17/23   Malen Scudder, DO  Multiple Vitamin (MULTIVITAMIN WITH MINERALS) TABS tablet Take 1 tablet by mouth daily. 06/28/23   Lawrance Presume, MD  oseltamivir  (TAMIFLU ) 75 MG capsule Take 1 capsule (75 mg total) by mouth 2 (two) times daily. 05/17/23   Sheree Dieter, MD  pantoprazole  (PROTONIX ) 40 MG tablet Take 1 tablet (40 mg total) by mouth daily at 6 (six) AM. 06/28/23   Lawrance Presume, MD  thiamine  (VITAMIN B1) 100 MG tablet Take 1 tablet (100 mg total) by mouth daily. 05/18/23   Malen Scudder, DO                                                                                                                                    Past Surgical History Past Surgical History:  Procedure Laterality Date   INCISE AND DRAIN ABCESS     dog bite   Family History Family History  Problem Relation Age of Onset   Hypertension Mother    Breast cancer Sister    Hypertension Sister    Colon cancer Brother    Colon polyps Neg Hx    Esophageal cancer Neg Hx    Rectal cancer Neg Hx    Stomach cancer Neg Hx     Social History Social History   Tobacco Use   Smoking status: Never   Smokeless tobacco: Never  Vaping Use   Vaping status: Never Used  Substance Use Topics   Alcohol use: Yes    Alcohol/week: 2.0 standard drinks of alcohol    Types: 2 Cans of beer per week   Drug use: Not Currently    Types: "Crack" cocaine   Allergies Tobacco  Review of Systems Review of Systems  All other systems reviewed and are negative.   Physical Exam Vital Signs  I have reviewed the triage vital signs BP (!) 143/102 (BP Location: Left Arm)   Pulse 70   Temp (!) 97.4 F (36.3 C) (Oral)   Resp (!) 23   SpO2 97%  Physical Exam Vitals and nursing note reviewed.  Constitutional:      General: He is not in acute distress.    Appearance: Normal appearance.  HENT:     Mouth/Throat:     Mouth: Mucous membranes  are moist.  Eyes:     Conjunctiva/sclera: Conjunctivae normal.  Cardiovascular:     Rate and Rhythm: Normal rate and regular  rhythm.     Pulses:          Radial pulses are 2+ on the right side and 2+ on the left side.  Pulmonary:     Effort: Pulmonary effort is normal. No respiratory distress.     Breath sounds: Normal breath sounds.  Chest:     Chest wall: Tenderness (LUSB, reproduces pain) present.  Abdominal:     General: Abdomen is flat.     Palpations: Abdomen is soft.     Tenderness: There is no abdominal tenderness.  Musculoskeletal:     Right lower leg: No edema.     Left lower leg: No edema.  Skin:    General: Skin is warm and dry.     Capillary Refill: Capillary refill takes less than 2 seconds.  Neurological:     Mental Status: He is alert and oriented to person, place, and time. Mental status is at baseline.  Psychiatric:        Mood and Affect: Mood normal.        Behavior: Behavior normal.     ED Results and Treatments Labs (all labs ordered are listed, but only abnormal results are displayed) Labs Reviewed  BASIC METABOLIC PANEL WITH GFR - Abnormal; Notable for the following components:      Result Value   CO2 21 (*)    All other components within normal limits  CBC - Abnormal; Notable for the following components:   RBC 4.13 (*)    MCV 100.7 (*)    All other components within normal limits  TROPONIN I (HIGH SENSITIVITY)  TROPONIN I (HIGH SENSITIVITY)                                                                                                                          Radiology DG Chest 2 View Result Date: 07/20/2023 CLINICAL DATA:  Chest pain. EXAM: CHEST - 2 VIEW COMPARISON:  05/14/2023 FINDINGS: Both lungs are clear. Heart and mediastinum are within normal limits. Trachea is midline. Negative for a pneumothorax. No pleural effusions. No acute bone abnormality. IMPRESSION: No active cardiopulmonary disease. Electronically Signed   By: Elene Griffes M.D.    On: 07/20/2023 15:39    Pertinent labs & imaging results that were available during my care of the patient were reviewed by me and considered in my medical decision making (see MDM for details).  Medications Ordered in ED Medications  acetaminophen  (TYLENOL ) tablet 1,000 mg (has no administration in time range)  Procedures Procedures  (including critical care time)  Medical Decision Making / ED Course   MDM:  68 year old presenting to the emergency department with chest pain.  Patient well-appearing, physical examination with reproducible chest pain.  EKG with no significant change from prior.  Chest pain seems very consistent with musculoskeletal chest pain.  Is worse with moving, cough.  Is reproducible on palpation.  Considered ACS however troponin negative x 2 and ECG with no changes, symptoms atypical for ACS.  Chest x-ray obtained with no evidence of pneumonia, pneumothorax,.  Equal pulses, pain does not radiate, chest x-ray with no mediastinal widening, low concern for dissection or other process.  Lungs are clear on exam with no wheezing to suggest active COPD exacerbation.  The patient request refills of his inhalers which I will provide.  He is also requesting that we give him a meal.  Will discharge patient to home. All questions answered. Patient comfortable with plan of discharge. Return precautions discussed with patient and specified on the after visit summary.         Additional history obtained:  -External records from outside source obtained and reviewed including: Chart review including previous notes, labs, imaging, consultation notes including prior notes    Lab Tests: -I ordered, reviewed, and interpreted labs.   The pertinent results include:   Labs Reviewed  BASIC METABOLIC PANEL WITH GFR - Abnormal; Notable for the  following components:      Result Value   CO2 21 (*)    All other components within normal limits  CBC - Abnormal; Notable for the following components:   RBC 4.13 (*)    MCV 100.7 (*)    All other components within normal limits  TROPONIN I (HIGH SENSITIVITY)  TROPONIN I (HIGH SENSITIVITY)    Notable for normal troponin  EKG   EKG Interpretation Date/Time:  Thursday Jul 20 2023 13:51:54 EDT Ventricular Rate:  88 PR Interval:  138 QRS Duration:  74 QT Interval:  352 QTC Calculation: 425 R Axis:   87  Text Interpretation: Normal sinus rhythm Right atrial enlargement Borderline ECG When compared with ECG of 13-May-2023 23:57, No significant change since last tracing Confirmed by Hiawatha Lout ([id number]) on 07/20/2023 6:26:10 PM         Imaging Studies ordered: I ordered imaging studies including CXR On my interpretation imaging demonstrates no acute process I independently visualized and interpreted imaging. I agree with the radiologist interpretation   Medicines ordered and prescription drug management: Meds ordered this encounter  Medications   arformoterol  (BROVANA ) 15 MCG/2ML NEBU    Sig: Inhale 2 mLs (15 mcg total) into the lungs 2 (two) times daily.    Dispense:  120 mL    Refill:  2    ok per Dr. Rozelle Corning to switch to [place]ana  neb soln...[facility]   albuterol  (VENTOLIN  HFA) 108 (90 Base) MCG/ACT inhaler    Sig: Inhale 2 puffs into the lungs every 6 (six) hours as needed for wheezing or shortness of breath.    Dispense:  8.5 g    Refill:  0   budesonide  (PULMICORT ) 0.25 MG/2ML nebulizer solution    Sig: Take 2 mLs (0.25 mg total) by nebulization 2 (two) times daily.    Dispense:  60 mL    Refill:  2   acetaminophen  (TYLENOL ) tablet 1,000 mg    -I have reviewed the patients home medicines and have made adjustments as needed   Social Determinants of Health:  Diagnosis or treatment significantly limited  by social determinants of health: polysubstance  abuse   Reevaluation: After the interventions noted above, I reevaluated the patient and found that their symptoms have improved  Co morbidities that complicate the patient evaluation  Past Medical History:  Diagnosis Date   Asthma    COPD (chronic obstructive pulmonary disease) (HCC)    Hepatitis C antibody positive in blood    HLD (hyperlipidemia)    HTN (hypertension)    On home oxygen  therapy    per pt 08-31-2021 uses 02 about every other day      Dispostion: Disposition decision including need for hospitalization was considered, and patient discharged from emergency department.    Final Clinical Impression(s) / ED Diagnoses Final diagnoses:  Atypical chest pain     This chart was dictated using voice recognition software.  Despite best efforts to proofread,  errors can occur which can change the documentation meaning.    Mordecai Applebaum, MD 07/20/23 [phone]

## 2023-07-20 NOTE — ED Triage Notes (Signed)
 Pt c/o midsternal CP and SOB that woke him from sleep last night. Pt denies N/V, diaphoresis.

## 2023-07-20 NOTE — ED Provider Triage Note (Signed)
 Emergency Medicine Provider Triage Evaluation Note  Jason Wong , a 68 y.o. male  was evaluated in triage.  Pt complains of chest pain, center to left side, onset 6am, constant, with SHOB. Pain lying down, not worse with exertion.  Review of Systems  Positive: CP, SHOB Negative: Nausea, vomiting, diaphoresis   Physical Exam  BP 102/73 (BP Location: Right Arm)   Pulse 92   Temp 98.5 F (36.9 C)   Resp 18   SpO2 96%  Gen:   Awake, no distress   Resp:  Normal effort  MSK:   Moves extremities without difficulty  Other:    Medical Decision Making  Medically screening exam initiated at 2:07 PM.  Appropriate orders placed.  Jason Wong was informed that the remainder of the evaluation will be completed by another provider, this initial triage assessment does not replace that evaluation, and the importance of remaining in the ED until their evaluation is complete.     Jason Eisenmenger, PA-C 07/20/23 1408

## 2023-07-20 NOTE — ED Notes (Signed)
 Patient reports increasing SHOB.  Reports "I can't breath when I do anything."

## 2023-07-21 ENCOUNTER — Other Ambulatory Visit: Payer: Self-pay

## 2023-07-24 ENCOUNTER — Ambulatory Visit: Payer: Self-pay | Attending: Internal Medicine | Admitting: Internal Medicine

## 2023-07-24 ENCOUNTER — Encounter: Payer: Self-pay | Admitting: Internal Medicine

## 2023-07-24 ENCOUNTER — Other Ambulatory Visit: Payer: Self-pay

## 2023-07-24 VITALS — BP 114/75 | HR 74 | Temp 98.2°F | Ht 67.0 in | Wt 115.0 lb

## 2023-07-24 DIAGNOSIS — R0789 Other chest pain: Secondary | ICD-10-CM

## 2023-07-24 DIAGNOSIS — F17210 Nicotine dependence, cigarettes, uncomplicated: Secondary | ICD-10-CM

## 2023-07-24 DIAGNOSIS — F1010 Alcohol abuse, uncomplicated: Secondary | ICD-10-CM

## 2023-07-24 DIAGNOSIS — I10 Essential (primary) hypertension: Secondary | ICD-10-CM

## 2023-07-24 DIAGNOSIS — E7800 Pure hypercholesterolemia, unspecified: Secondary | ICD-10-CM

## 2023-07-24 DIAGNOSIS — R040 Epistaxis: Secondary | ICD-10-CM | POA: Diagnosis not present

## 2023-07-24 DIAGNOSIS — J432 Centrilobular emphysema: Secondary | ICD-10-CM | POA: Diagnosis not present

## 2023-07-24 DIAGNOSIS — F1910 Other psychoactive substance abuse, uncomplicated: Secondary | ICD-10-CM

## 2023-07-24 DIAGNOSIS — F17200 Nicotine dependence, unspecified, uncomplicated: Secondary | ICD-10-CM

## 2023-07-24 DIAGNOSIS — Z1331 Encounter for screening for depression: Secondary | ICD-10-CM

## 2023-07-24 DIAGNOSIS — F1491 Cocaine use, unspecified, in remission: Secondary | ICD-10-CM

## 2023-07-24 DIAGNOSIS — Z1211 Encounter for screening for malignant neoplasm of colon: Secondary | ICD-10-CM

## 2023-07-24 MED ORDER — PANTOPRAZOLE SODIUM 40 MG PO TBEC
40.0000 mg | DELAYED_RELEASE_TABLET | Freq: Every day | ORAL | 1 refills | Status: DC
  Filled 2023-07-24: qty 60, 60d supply, fill #0

## 2023-07-24 MED ORDER — SALINE SPRAY 0.65 % NA SOLN
1.0000 | NASAL | 1 refills | Status: DC | PRN
  Filled 2023-07-24: qty 30, fill #0

## 2023-07-24 MED ORDER — FOLIC ACID 1 MG PO TABS
1.0000 mg | ORAL_TABLET | Freq: Every day | ORAL | 1 refills | Status: DC
  Filled 2023-07-24: qty 90, 90d supply, fill #0

## 2023-07-24 MED ORDER — UMECLIDINIUM BROMIDE 62.5 MCG/ACT IN AEPB
1.0000 | INHALATION_SPRAY | Freq: Every day | RESPIRATORY_TRACT | 6 refills | Status: DC
  Filled 2023-07-24: qty 30, 30d supply, fill #0

## 2023-07-24 MED ORDER — FOLIC ACID 1 MG PO TABS: 1.0000 mg | ORAL_TABLET | Freq: Every day | ORAL | 1 refills | Status: DC

## 2023-07-24 MED ORDER — THIAMINE HCL 100 MG PO TABS
100.0000 mg | ORAL_TABLET | Freq: Every day | ORAL | 0 refills | Status: DC
  Filled 2023-07-24: qty 30, 30d supply, fill #0

## 2023-07-24 MED ORDER — IPRATROPIUM-ALBUTEROL 0.5-2.5 (3) MG/3ML IN SOLN
3.0000 mL | Freq: Four times a day (QID) | RESPIRATORY_TRACT | 1 refills | Status: DC | PRN
  Filled 2023-07-24: qty 360, 30d supply, fill #0

## 2023-07-24 MED ORDER — ATORVASTATIN CALCIUM 10 MG PO TABS
10.0000 mg | ORAL_TABLET | Freq: Every day | ORAL | 1 refills | Status: DC
  Filled 2023-07-24: qty 90, 90d supply, fill #0

## 2023-07-24 MED ORDER — ALBUTEROL SULFATE HFA 108 (90 BASE) MCG/ACT IN AERS: 2.0000 | INHALATION_SPRAY | Freq: Four times a day (QID) | RESPIRATORY_TRACT | 0 refills | Status: DC | PRN

## 2023-07-24 NOTE — Progress Notes (Addendum)
 Patient ID: Zavian Cheers, male    DOB: 1956-01-05  MRN: [id number]  CC: Hypertension (HTN f/u. Med refills. /Reports being out of meds X1 mo/Intermittent bleeds X2-3 weeks )   Subjective: Elis Simonini is a 68 y.o. male who presents for chronic ds management. His concerns today include:  Pt with hx of HTN, tob dep, seasonal allergies, asthma, centrilobular emphysema, insomnia, coronary atherosclerosis, pre-DM, polysub abuse (ETOH, cocaine, drug over dose accidentally 08/2020)  and hep C treated.    Patient does not have medications with him.  Discussed the use of AI scribe software for clinical note transcription with the patient, who gave verbal consent to proceed.  History of Present Illness   Jason Wong is a 68 year old male with COPD and hypertension who presents with chest pain and medication refill.  He experienced severe and sudden chest pain on May 1st, leading to an ER visit where no acute findings were noted on EKG or chest x-ray.  Thought to be musculoskeletal in nature.  He occasionally feels the pain might return but it does not fully manifest.  HTN: He has hypertension and was previously on amlodipine  5 mg but has been out of this medication for almost a month. He does not monitor his blood pressure at home.  His blood pressure was 143/102 during the ER visit and 114/75 today.  COPD/tobacco dependence: He has COPD and uses a nebulizer at home two to three times a week for dyspnea.  He should have DuoNeb and Pulmicort  liquid at home.  He ran out of his albuterol  inhaler. He experiences increased cough with occasional phlegm and morning dyspnea. He smokes one cigarette daily, reduced from previous higher usage, and wants to quit.  Does not feel that he needs medication to help him quit.  EtOH use/cocaine use: He consumes alcohol daily, typically one beer and two to three shots of liquor, and is willing to cut back.  Reports he has been free of cocaine for quite some time  now.  He reports minor epistaxis from the right nostril over the past three to four weeks, occurring about three times.  He does not pick his nose.  HL: Out of atorvastatin .    HM: Due for colon cancer screening.  Referred to GI last year but they were unsuccessful in reaching him via phone.  Patient states that he lives with people who do not answer the phone.  He has a cell phone but inconsistently carries it. Patient Active Problem List   Diagnosis Date Noted   Hematuria 05/17/2023   Acute on chronic respiratory failure with hypoxia (HCC) 05/16/2023   Cystitis 05/14/2023   Influenza A 05/13/2023   Protein-calorie malnutrition (HCC) 05/13/2023   Urinary retention due to benign prostatic hyperplasia 05/13/2023   Acute respiratory failure with hypoxia (HCC) 02/17/2023   HTN (hypertension) 02/17/2023   HLD (hyperlipidemia) 02/17/2023   Tobacco use 02/17/2023   Acute exacerbation of chronic obstructive pulmonary disease (COPD) (HCC) 02/17/2023   Multifocal pneumonia 01/30/2023   COPD with acute exacerbation (HCC) 01/30/2023   COPD with acute exacerbation (HCC) 06/20/2022   Multifocal pneumonia 06/20/2022   Hypokalemia 06/20/2022   Acute respiratory failure with hypoxia (HCC) 06/20/2022   Polysubstance abuse (HCC) 05/09/2022   On home oxygen  therapy 08/31/2021   Malnutrition of moderate degree 06/16/2021   Acute exacerbation of chronic obstructive pulmonary disease (COPD) (HCC) 06/14/2021   ETOH abuse 05/01/2021   Acute hypoxemic respiratory failure (HCC) 04/30/2021   HLD (hyperlipidemia)  04/30/2021   COPD (chronic obstructive pulmonary disease) with emphysema (HCC) 02/19/2021   COPD exacerbation (HCC) 02/18/2021   Memory deficit 04/28/2020   Hepatitis C virus infection cured after antiviral drug therapy 04/27/2018   Enuresis 04/27/2018   Centrilobular emphysema (HCC) 04/27/2018   Seasonal allergic rhinitis 04/27/2018   Tobacco dependence 08/24/2017   Weight loss, unintentional  08/24/2017   Insomnia 06/27/2017   Prediabetes 01/24/2017   Asthma 06/10/2016   Essential hypertension 06/10/2016   Current every day smoker 06/10/2016     Current Outpatient Medications on File Prior to Visit  Medication Sig Dispense Refill   acetaminophen  (TYLENOL ) 500 MG tablet Take 500 mg by mouth as needed for mild pain (pain score 1-3) or headache. (Patient not taking: Reported on 07/24/2023)     arformoterol  (BROVANA ) 15 MCG/2ML NEBU Inhale 2 mLs (15 mcg total) into the lungs 2 (two) times daily. (Patient not taking: Reported on 07/24/2023) 120 mL 2   budesonide  (PULMICORT ) 0.25 MG/2ML nebulizer solution Take 2 mLs (0.25 mg total) by nebulization 2 (two) times daily. (Patient not taking: Reported on 07/24/2023) 60 mL 2   famotidine  (PEPCID ) 20 MG tablet Take 1 tablet (20 mg total) by mouth 2 (two) times daily. (Patient not taking: Reported on 07/24/2023) 60 tablet 0   No current facility-administered medications on file prior to visit.    Allergies  Allergen Reactions   Tobacco Shortness Of Breath and Other (See Comments)    CIGARETTE SMOKE TRIGGERS THE PATIENT'S RESPIRATORY ISSUES!!    Social History   Socioeconomic History   Marital status: Single    Spouse name: Not on file   Number of children: Not on file   Years of education: Not on file   Highest education level: Not on file  Occupational History   Not on file  Tobacco Use   Smoking status: Never   Smokeless tobacco: Never  Vaping Use   Vaping status: Never Used  Substance and Sexual Activity   Alcohol use: Yes    Alcohol/week: 2.0 standard drinks of alcohol    Types: 2 Cans of beer per week   Drug use: Not Currently    Types: "Crack" cocaine   Sexual activity: Yes    Partners: Female  Other Topics Concern   Not on file  Social History Narrative   ** Merged History Encounter **       Pt states he is from home with his nephew. He uses the bus to get to appointments and the grocery store and will be using the  bus to get home. He has a nebulizer at home and two metal oxygen  tanks that he uses as needed. He states he doesn't know who fil   ls them, but says they do know who to call when they get empty.   Social Drivers of Corporate investment banker Strain: Low Risk  (07/24/2023)   Overall Financial Resource Strain (CARDIA)    Difficulty of Paying Living Expenses: Not hard at all  Food Insecurity: No Food Insecurity (07/24/2023)   Hunger Vital Sign    Worried About Running Out of Food in the Last Year: Never true    Ran Out of Food in the Last Year: Never true  Transportation Needs: No Transportation Needs (07/24/2023)   PRAPARE - Administrator, Civil Service (Medical): No    Lack of Transportation (Non-Medical): No  Recent Concern: Transportation Needs - Unmet Transportation Needs (05/15/2023)   PRAPARE - Transportation  Lack of Transportation (Medical): Yes    Lack of Transportation (Non-Medical): Yes  Physical Activity: Inactive (07/24/2023)   Exercise Vital Sign    Days of Exercise per Week: 0 days    Minutes of Exercise per Session: 0 min  Stress: No Stress Concern Present (07/24/2023)   [employer] of Occupational Health - Occupational Stress Questionnaire    Feeling of Stress : Only a little  Social Connections: Socially Integrated (07/24/2023)   Social Connection and Isolation Panel [NHANES]    Frequency of Communication with Friends and Family: More than three times a week    Frequency of Social Gatherings with Friends and Family: More than three times a week    Attends Religious Services: More than 4 times per year    Active Member of [employer] or Organizations: Yes    Attends Banker Meetings: 1 to 4 times per year    Marital Status: Married  Catering manager Violence: Not At Risk (07/24/2023)   Humiliation, Afraid, Rape, and Kick questionnaire    Fear of Current or Ex-Partner: No    Emotionally Abused: No    Physically Abused: No    Sexually Abused: No     Family History  Problem Relation Age of Onset   Hypertension Mother    Breast cancer Sister    Hypertension Sister    Colon cancer Brother    Colon polyps Neg Hx    Esophageal cancer Neg Hx    Rectal cancer Neg Hx    Stomach cancer Neg Hx     Past Surgical History:  Procedure Laterality Date   INCISE AND DRAIN ABCESS     dog bite    ROS: Review of Systems Negative except as stated above  PHYSICAL EXAM: BP 114/75 (BP Location: Left Arm, Patient Position: Sitting, Cuff Size: Small)   Pulse 74   Temp 98.2 F (36.8 C) (Oral)   Ht 5\' 7"  (1.702 m)   Wt 115 lb (52.2 kg)   SpO2 97%   BMI 18.01 kg/m   Physical Exam   General appearance -alert older African-American male in NAD.  He appears unkept with soiled clothing Mental status -patient answers questions appropriately but he is forgetful. Nose -nasal mucosa is dry.  No dried blood noted at this time Neck - supple, no significant adenopathy Chest -breath sounds moderately decreased but no wheezes heard Heart - normal rate, regular rhythm, normal S1, S2, no murmurs, rubs, clicks or gallops Extremities - peripheral pulses normal, no pedal edema, no clubbing or cyanosis    07/24/2023    9:39 AM 02/01/2023    9:00 AM 09/13/2022    9:08 AM  Depression screen PHQ 2/9  Decreased Interest 3 3 0  Down, Depressed, Hopeless 0 0 0  PHQ - 2 Score 3 3 0  Altered sleeping 3 0 0  Tired, decreased energy 2 0 1  Change in appetite 3 0 0  Feeling bad or failure about yourself  3 0 0  Trouble concentrating 0 0 0  Moving slowly or fidgety/restless 0 0 0  Suicidal thoughts 0 0 0  PHQ-9 Score 14 3 1   Difficult doing work/chores Not difficult at all         Latest Ref Rng & Units 07/20/2023    1:58 PM 05/17/2023    2:33 AM 05/16/2023    2:06 AM  CMP  Glucose 70 - 99 mg/dL 91  098  119   BUN 8 - 23 mg/dL 12  39  26   Creatinine 0.61 - 1.24 mg/dL 1.61  0.96  0.45   Sodium 135 - 145 mmol/L 143  139  139   Potassium 3.5 - 5.1  mmol/L 3.8  4.8  4.4   Chloride 98 - 111 mmol/L 110  105  101   CO2 22 - 32 mmol/L 21  26  27    Calcium  8.9 - 10.3 mg/dL 9.6  8.9  9.4    Lipid Panel     Component Value Date/Time   CHOL 171 09/06/2019 1053   TRIG 57 09/06/2019 1053   HDL 70 09/06/2019 1053   CHOLHDL 2.4 09/06/2019 1053   LDLCALC 90 09/06/2019 1053    CBC    Component Value Date/Time   WBC 7.2 07/20/2023 1358   RBC 4.13 (L) 07/20/2023 1358   HGB 13.6 07/20/2023 1358   HGB 17.2 09/06/2019 1053   HCT 41.6 07/20/2023 1358   HCT 49.9 09/06/2019 1053   PLT 337 07/20/2023 1358   PLT 274 09/06/2019 1053   MCV 100.7 (H) 07/20/2023 1358   MCV 98 (H) 09/06/2019 1053   MCH 32.9 07/20/2023 1358   MCHC 32.7 07/20/2023 1358   RDW 15.3 07/20/2023 1358   RDW 11.5 (L) 09/06/2019 1053   LYMPHSABS 0.5 (L) 05/13/2023 1455   LYMPHSABS 2.5 06/13/2016 1110   MONOABS 0.3 05/13/2023 1455   EOSABS 0.0 05/13/2023 1455   EOSABS 0.1 06/13/2016 1110   BASOSABS 0.0 05/13/2023 1455   BASOSABS 0.0 06/13/2016 1110    ASSESSMENT AND PLAN: 1. Essential hypertension (Primary) Blood pressure today is normal.  It was repeated and still good.  Will hold off on refilling the amlodipine  since blood pressure is good and he has been out of it for over a week.  2. Centrilobular emphysema (HCC) Will try to get him on Incruse.  Refill sent on albuterol  inhaler.  Advised to quit smoking. - albuterol  (VENTOLIN  HFA) 108 (90 Base) MCG/ACT inhaler; Inhale 2 puffs into the lungs every 6 (six) hours as needed for wheezing or shortness of breath.  Dispense: 6.7 g; Refill: 0 - ipratropium-albuterol  (DUONEB) 0.5-2.5 (3) MG/3ML SOLN; Take 3 mLs by nebulization every 6 (six) hours as needed.  Dispense: 360 mL; Refill: 1 - umeclidinium bromide  (INCRUSE ELLIPTA ) 62.5 MCG/ACT AEPB; Inhale 1 puff into the lungs daily.  Dispense: 30 each; Refill: 6  3. Atypical chest pain No further workup at this time.  Advised to let me know if he has any further chest  pains  4. Pure hypercholesterolemia Refill atorvastatin  - atorvastatin  (LIPITOR) 10 MG tablet; Take 1 tablet (10 mg total) by mouth daily.  Dispense: 90 tablet; Refill: 1  5. Tobacco dependence Advised to quit.  Patient states he is ready to give a trial of quitting again.  He plans to quit cold [place].  Declines nicotine  patches/gum or Chantix.  Advised that he set a quit date.  We will reassess progress on next visit. 3 mins spent on counseling.  6. Polysubstance abuse (HCC) Commended him on being free of cocaine. However he is abusing alcohol.  Discussed health risks associated with alcohol abuse.  Encouraged him to quit completely if not tried to cut back to no more than one 12 oz beer a day - folic acid  (FOLVITE ) 1 MG tablet; Take 1 tablet (1 mg total) by mouth daily.  Dispense: 90 tablet; Refill: 1 - thiamine  (VITAMIN B1) 100 MG tablet; Take 1 tablet (100 mg total) by mouth daily.  Dispense: 30  tablet; Refill: 0  7. Cocaine use disorder in remission See #6 above  8. Positive depression screening Depression screen is positive but patient states this is not a major issue for him.  Does not feel he needs any medication or counseling at this time  9. Right-sided epistaxis Advised to avoid picking the nose.  Nasal mucosa looks dry.  Prescription sent for saline nasal spray.  Will refer to ENT - Ambulatory referral to ENT - PT AND PTT  10. Screening for colon cancer - Fecal occult blood, imunochemical([facility])     Patient was given the opportunity to ask questions.  Patient verbalized understanding of the plan and was able to repeat key elements of the plan.   This documentation was completed using Paediatric nurse.  Any transcriptional errors are unintentional.  Orders Placed This Encounter  Procedures   Fecal occult blood, imunochemical([facility])   PT AND PTT   Ambulatory referral to ENT     Requested Prescriptions   Signed  Prescriptions Disp Refills   albuterol  (VENTOLIN  HFA) 108 (90 Base) MCG/ACT inhaler 6.7 g 0    Sig: Inhale 2 puffs into the lungs every 6 (six) hours as needed for wheezing or shortness of breath.   atorvastatin  (LIPITOR) 10 MG tablet 90 tablet 1    Sig: Take 1 tablet (10 mg total) by mouth daily.   pantoprazole  (PROTONIX ) 40 MG tablet 60 tablet 1    Sig: Take 1 tablet (40 mg total) by mouth daily at 6 (six) AM.   ipratropium-albuterol  (DUONEB) 0.5-2.5 (3) MG/3ML SOLN 360 mL 1    Sig: Take 3 mLs by nebulization every 6 (six) hours as needed.   umeclidinium bromide  (INCRUSE ELLIPTA ) 62.5 MCG/ACT AEPB 30 each 6    Sig: Inhale 1 puff into the lungs daily.   folic acid  (FOLVITE ) 1 MG tablet 90 tablet 1    Sig: Take 1 tablet (1 mg total) by mouth daily.   thiamine  (VITAMIN B1) 100 MG tablet 30 tablet 0    Sig: Take 1 tablet (100 mg total) by mouth daily.   sodium chloride  (OCEAN) 0.65 % SOLN nasal spray 30 mL 1    Sig: Place 1 spray into both nostrils as needed for congestion.    Return in about 4 weeks (around 08/21/2023) for for Medicare Wellness Visit with me.Concetta Dee, MD, FACP

## 2023-07-25 LAB — PT AND PTT
INR: 1 (ref 0.9–1.2)
Prothrombin Time: 11.7 s (ref 9.1–12.0)
aPTT: 30 s (ref 24–33)

## 2023-07-27 ENCOUNTER — Other Ambulatory Visit: Payer: Self-pay

## 2023-08-02 ENCOUNTER — Ambulatory Visit: Payer: Self-pay

## 2023-08-02 ENCOUNTER — Other Ambulatory Visit: Payer: Self-pay

## 2023-08-03 ENCOUNTER — Other Ambulatory Visit: Payer: Self-pay

## 2023-08-24 ENCOUNTER — Emergency Department (HOSPITAL_COMMUNITY)

## 2023-08-24 ENCOUNTER — Inpatient Hospital Stay (HOSPITAL_COMMUNITY)
Admission: EM | Admit: 2023-08-24 | Discharge: 2023-08-26 | DRG: 189 | Disposition: A | Discharge status: Home / Self Care | Attending: Internal Medicine | Admitting: Internal Medicine

## 2023-08-24 DIAGNOSIS — Z1152 Encounter for screening for COVID-19: Secondary | ICD-10-CM | POA: Diagnosis not present

## 2023-08-24 DIAGNOSIS — Z9981 Dependence on supplemental oxygen: Secondary | ICD-10-CM | POA: Diagnosis not present

## 2023-08-24 DIAGNOSIS — E785 Hyperlipidemia, unspecified: Secondary | ICD-10-CM | POA: Diagnosis present

## 2023-08-24 DIAGNOSIS — N400 Enlarged prostate without lower urinary tract symptoms: Secondary | ICD-10-CM | POA: Diagnosis present

## 2023-08-24 DIAGNOSIS — I119 Hypertensive heart disease without heart failure: Secondary | ICD-10-CM | POA: Diagnosis present

## 2023-08-24 DIAGNOSIS — Z681 Body mass index (BMI) 19 or less, adult: Secondary | ICD-10-CM | POA: Diagnosis not present

## 2023-08-24 DIAGNOSIS — Z716 Tobacco abuse counseling: Secondary | ICD-10-CM

## 2023-08-24 DIAGNOSIS — J432 Centrilobular emphysema: Secondary | ICD-10-CM

## 2023-08-24 DIAGNOSIS — I5021 Acute systolic (congestive) heart failure: Secondary | ICD-10-CM | POA: Diagnosis not present

## 2023-08-24 DIAGNOSIS — E43 Unspecified severe protein-calorie malnutrition: Secondary | ICD-10-CM | POA: Diagnosis not present

## 2023-08-24 DIAGNOSIS — J9601 Acute respiratory failure with hypoxia: Secondary | ICD-10-CM | POA: Diagnosis not present

## 2023-08-24 DIAGNOSIS — R0602 Shortness of breath: Secondary | ICD-10-CM | POA: Diagnosis present

## 2023-08-24 DIAGNOSIS — K219 Gastro-esophageal reflux disease without esophagitis: Secondary | ICD-10-CM | POA: Diagnosis present

## 2023-08-24 DIAGNOSIS — D7589 Other specified diseases of blood and blood-forming organs: Secondary | ICD-10-CM | POA: Diagnosis not present

## 2023-08-24 DIAGNOSIS — J441 Chronic obstructive pulmonary disease with (acute) exacerbation: Principal | ICD-10-CM | POA: Diagnosis present

## 2023-08-24 DIAGNOSIS — F1410 Cocaine abuse, uncomplicated: Secondary | ICD-10-CM | POA: Diagnosis present

## 2023-08-24 DIAGNOSIS — Z8249 Family history of ischemic heart disease and other diseases of the circulatory system: Secondary | ICD-10-CM

## 2023-08-24 DIAGNOSIS — Z7951 Long term (current) use of inhaled steroids: Secondary | ICD-10-CM

## 2023-08-24 DIAGNOSIS — F17210 Nicotine dependence, cigarettes, uncomplicated: Secondary | ICD-10-CM | POA: Diagnosis present

## 2023-08-24 DIAGNOSIS — F1010 Alcohol abuse, uncomplicated: Secondary | ICD-10-CM | POA: Diagnosis present

## 2023-08-24 DIAGNOSIS — F1910 Other psychoactive substance abuse, uncomplicated: Secondary | ICD-10-CM

## 2023-08-24 LAB — RAPID URINE DRUG SCREEN, HOSP PERFORMED
Amphetamines: NOT DETECTED
Barbiturates: NOT DETECTED
Benzodiazepines: NOT DETECTED
Cocaine: POSITIVE — AB
Opiates: NOT DETECTED
Tetrahydrocannabinol: NOT DETECTED

## 2023-08-24 LAB — RESP PANEL BY RT-PCR (RSV, FLU A&B, COVID)  RVPGX2
Influenza A by PCR: NEGATIVE
Influenza B by PCR: NEGATIVE
Resp Syncytial Virus by PCR: NEGATIVE
SARS Coronavirus 2 by RT PCR: NEGATIVE

## 2023-08-24 LAB — CBC WITH DIFFERENTIAL/PLATELET
Abs Immature Granulocytes: 0.01 10*3/uL (ref 0.00–0.07)
Basophils Absolute: 0.1 10*3/uL (ref 0.0–0.1)
Basophils Relative: 2 %
Eosinophils Absolute: 0.7 10*3/uL — ABNORMAL HIGH (ref 0.0–0.5)
Eosinophils Relative: 13 %
HCT: 44.1 % (ref 39.0–52.0)
Hemoglobin: 14.8 g/dL (ref 13.0–17.0)
Immature Granulocytes: 0 %
Lymphocytes Relative: 38 %
Lymphs Abs: 2.1 10*3/uL (ref 0.7–4.0)
MCH: 34.3 pg — ABNORMAL HIGH (ref 26.0–34.0)
MCHC: 33.6 g/dL (ref 30.0–36.0)
MCV: 102.3 fL — ABNORMAL HIGH (ref 80.0–100.0)
Monocytes Absolute: 0.5 10*3/uL (ref 0.1–1.0)
Monocytes Relative: 9 %
Neutro Abs: 2.1 10*3/uL (ref 1.7–7.7)
Neutrophils Relative %: 38 %
Platelets: 286 10*3/uL (ref 150–400)
RBC: 4.31 MIL/uL (ref 4.22–5.81)
RDW: 14 % (ref 11.5–15.5)
WBC: 5.6 10*3/uL (ref 4.0–10.5)
nRBC: 0 % (ref 0.0–0.2)

## 2023-08-24 LAB — TROPONIN I (HIGH SENSITIVITY)
Troponin I (High Sensitivity): 7 ng/L (ref <18)
Troponin I (High Sensitivity): 7 ng/L (ref <18)

## 2023-08-24 LAB — COMPREHENSIVE METABOLIC PANEL WITH GFR
ALT: 12 U/L (ref 0–44)
AST: 23 U/L (ref 15–41)
Albumin: 4.3 g/dL (ref 3.5–5.0)
Alkaline Phosphatase: 88 U/L (ref 38–126)
Anion gap: 12 (ref 5–15)
BUN: 11 mg/dL (ref 8–23)
CO2: 23 mmol/L (ref 22–32)
Calcium: 9.7 mg/dL (ref 8.9–10.3)
Chloride: 107 mmol/L (ref 98–111)
Creatinine, Ser: 0.96 mg/dL (ref 0.61–1.24)
GFR, Estimated: >60 mL/min (ref >60)
Glucose, Bld: 103 mg/dL — ABNORMAL HIGH (ref 70–99)
Potassium: 3.9 mmol/L (ref 3.5–5.1)
Sodium: 142 mmol/L (ref 135–145)
Total Bilirubin: 1.3 mg/dL — ABNORMAL HIGH (ref 0.0–1.2)
Total Protein: 7.4 g/dL (ref 6.5–8.1)

## 2023-08-24 MED ORDER — THIAMINE HCL 100 MG/ML IJ SOLN: 100.0000 mg | Freq: Every day | INTRAMUSCULAR | Status: DC

## 2023-08-24 MED ORDER — ALBUTEROL SULFATE (2.5 MG/3ML) 0.083% IN NEBU
2.5000 mg | INHALATION_SOLUTION | RESPIRATORY_TRACT | Status: DC | PRN
Start: 2023-08-24 — End: 2023-08-26
  Administered 2023-08-26: 2.5 mg via RESPIRATORY_TRACT
  Filled 2023-08-24: qty 3

## 2023-08-24 MED ORDER — PREDNISONE 20 MG PO TABS
40.0000 mg | ORAL_TABLET | Freq: Every day | ORAL | Status: DC
  Administered 2023-08-26: 40 mg via ORAL
  Filled 2023-08-24: qty 2

## 2023-08-24 MED ORDER — MAGNESIUM SULFATE 2 GM/50ML IV SOLN
2.0000 g | Freq: Once | INTRAVENOUS | Status: AC
  Administered 2023-08-24: 2 g via INTRAVENOUS
  Filled 2023-08-24: qty 50

## 2023-08-24 MED ORDER — ALBUTEROL SULFATE (2.5 MG/3ML) 0.083% IN NEBU
5.0000 mg | INHALATION_SOLUTION | Freq: Once | RESPIRATORY_TRACT | Status: AC
  Administered 2023-08-24: 5 mg via RESPIRATORY_TRACT
  Filled 2023-08-24: qty 6

## 2023-08-24 MED ORDER — ADULT MULTIVITAMIN W/MINERALS CH
1.0000 | ORAL_TABLET | Freq: Every day | ORAL | Status: DC
Start: 2023-08-25 — End: 2023-08-26
  Administered 2023-08-25 – 2023-08-26 (×2): 1 via ORAL
  Filled 2023-08-24 (×2): qty 1

## 2023-08-24 MED ORDER — ONDANSETRON HCL 4 MG PO TABS: 4.0000 mg | ORAL_TABLET | Freq: Four times a day (QID) | ORAL | Status: DC | PRN

## 2023-08-24 MED ORDER — LORAZEPAM 1 MG PO TABS: 1.0000 mg | ORAL_TABLET | ORAL | Status: DC | PRN

## 2023-08-24 MED ORDER — BISACODYL 5 MG PO TBEC: 5.0000 mg | DELAYED_RELEASE_TABLET | Freq: Every day | ORAL | Status: DC | PRN

## 2023-08-24 MED ORDER — ONDANSETRON HCL 4 MG/2ML IJ SOLN: 4.0000 mg | Freq: Four times a day (QID) | INTRAMUSCULAR | Status: DC | PRN

## 2023-08-24 MED ORDER — IPRATROPIUM-ALBUTEROL 0.5-2.5 (3) MG/3ML IN SOLN
3.0000 mL | Freq: Four times a day (QID) | RESPIRATORY_TRACT | Status: DC
  Administered 2023-08-25: 3 mL via RESPIRATORY_TRACT
  Filled 2023-08-24: qty 3

## 2023-08-24 MED ORDER — LORAZEPAM 2 MG/ML IJ SOLN: 1.0000 mg | INTRAMUSCULAR | Status: DC | PRN

## 2023-08-24 MED ORDER — SODIUM CHLORIDE 0.9% FLUSH
3.0000 mL | Freq: Two times a day (BID) | INTRAVENOUS | Status: DC
  Administered 2023-08-25 (×3): 3 mL via INTRAVENOUS

## 2023-08-24 MED ORDER — SODIUM CHLORIDE 0.9% FLUSH: 3.0000 mL | INTRAVENOUS | Status: DC | PRN

## 2023-08-24 MED ORDER — POLYETHYLENE GLYCOL 3350 17 G PO PACK: 17.0000 g | PACK | Freq: Every day | ORAL | Status: DC | PRN

## 2023-08-24 MED ORDER — PANTOPRAZOLE SODIUM 40 MG IV SOLR
40.0000 mg | Freq: Two times a day (BID) | INTRAVENOUS | Status: DC
  Administered 2023-08-25: 40 mg via INTRAVENOUS
  Filled 2023-08-24: qty 10

## 2023-08-24 MED ORDER — METHYLPREDNISOLONE SODIUM SUCC 125 MG IJ SOLR
125.0000 mg | Freq: Once | INTRAMUSCULAR | Status: AC
  Administered 2023-08-24: 125 mg via INTRAVENOUS
  Filled 2023-08-24: qty 2

## 2023-08-24 MED ORDER — DOXYCYCLINE HYCLATE 100 MG PO TABS
100.0000 mg | ORAL_TABLET | Freq: Two times a day (BID) | ORAL | Status: DC
  Administered 2023-08-25 – 2023-08-26 (×4): 100 mg via ORAL
  Filled 2023-08-24 (×4): qty 1

## 2023-08-24 MED ORDER — FOLIC ACID 1 MG PO TABS
1.0000 mg | ORAL_TABLET | Freq: Every day | ORAL | Status: DC
Start: 2023-08-25 — End: 2023-08-26
  Administered 2023-08-25 – 2023-08-26 (×2): 1 mg via ORAL
  Filled 2023-08-24 (×2): qty 1

## 2023-08-24 MED ORDER — ACETAMINOPHEN 650 MG RE SUPP: 650.0000 mg | Freq: Four times a day (QID) | RECTAL | Status: DC | PRN

## 2023-08-24 MED ORDER — GUAIFENESIN ER 600 MG PO TB12: 600.0000 mg | ORAL_TABLET | Freq: Two times a day (BID) | ORAL | Status: DC | PRN

## 2023-08-24 MED ORDER — HEPARIN SODIUM (PORCINE) 5000 UNIT/ML IJ SOLN
5000.0000 [IU] | Freq: Three times a day (TID) | INTRAMUSCULAR | Status: DC
  Administered 2023-08-25 – 2023-08-26 (×5): 5000 [IU] via SUBCUTANEOUS
  Filled 2023-08-24 (×5): qty 1

## 2023-08-24 MED ORDER — IPRATROPIUM-ALBUTEROL 0.5-2.5 (3) MG/3ML IN SOLN
3.0000 mL | RESPIRATORY_TRACT | Status: AC
  Administered 2023-08-24 (×3): 3 mL via RESPIRATORY_TRACT
  Filled 2023-08-24 (×3): qty 3

## 2023-08-24 MED ORDER — ACETAMINOPHEN 325 MG PO TABS: 650.0000 mg | ORAL_TABLET | Freq: Four times a day (QID) | ORAL | Status: DC | PRN

## 2023-08-24 MED ORDER — METHYLPREDNISOLONE SODIUM SUCC 125 MG IJ SOLR
60.0000 mg | Freq: Two times a day (BID) | INTRAMUSCULAR | Status: AC
  Administered 2023-08-25: 60 mg via INTRAVENOUS
  Filled 2023-08-24: qty 2

## 2023-08-24 MED ORDER — SODIUM CHLORIDE 0.9% FLUSH: 3.0000 mL | Freq: Two times a day (BID) | INTRAVENOUS | Status: DC

## 2023-08-24 NOTE — ED Notes (Addendum)
 Pt placed on 2L N/C in lobby.

## 2023-08-24 NOTE — ED Triage Notes (Addendum)
 Pt. Stated, Ive beeb SOB and I've had 2 nosebleeds since yesterday. One today and one yesterday. I did crack and cocaine yesterday

## 2023-08-24 NOTE — H&P (Addendum)
 History and Physical    Patient: Jason Wong ZHY:[id number] DOB: 06/06/1955 DOA: 08/24/2023 DOS: the patient was seen and examined on 08/24/2023 PCP: Lawrance Presume, MD  Patient coming from: Home Chief complaint: Chief Complaint  Patient presents with   Shortness of Breath   Epistaxis   HPI:  Jason Wong is a 68 y.o. male with past medical history  of chronic tobacco abuse/COPD/COPD exacerbation, essential hypertension, hyperlipidemia, BPH, GERD, cocaine abuse, alcohol abuse with last drink yesterday morning and history of withdrawals comes today for shortness of breath and epistaxis.  [name] reports that his shortness of breath has been going on for the past 2 days.  Patient is a very pleasant and cooperative patient.  He does not report any  other complaints of chest pain nausea vomiting and does not repor any other complaints.  Discussed with patient about cocaine causing the nosebleeds and agreeing to intervention up to his health and heart.  ED Course: Pt in ed at bedside  is alert,oriented and afebrile.  Vital signs in the ED were notable for the following:  Vitals:   08/24/23 2117 08/24/23 2200 08/24/23 2231 08/24/23 2300  BP: 115/85 (!) 122/105 (!) 120/100 (!) 139/94  Pulse: 88 79 77 78  Temp: 98.4 F (36.9 C)     Resp: (!) 22 19 18 16   Height:      Weight:      SpO2: 98% 96% 97% 96%  TempSrc: Oral     BMI (Calculated):      >>ED evaluation thus far shows: Normal CMP mild elevated bilirubin at 1.3. Normal troponin x 2. Procalcitonin ordered and pending. Normal CBC with a MCV of 102.3. Viral panel negative for flu RSV and COVID. Urine toxicology positive for cocaine. EKG today shows sinus rhythm 89 with a PR of 134 QRS of 82 QTc 433 right atrial enlargement,  >>While in the ED patient received the following: Medications  albuterol  (PROVENTIL ) (2.5 MG/3ML) 0.083% nebulizer solution 5 mg (has no administration in time range)  ipratropium-albuterol  (DUONEB) 0.5-2.5  (3) MG/3ML nebulizer solution 3 mL (3 mLs Nebulization Given 08/24/23 2158)  methylPREDNISolone  sodium succinate (SOLU-MEDROL ) 125 mg/2 mL injection 125 mg (125 mg Intravenous Given 08/24/23 2147)  magnesium  sulfate IVPB 2 g 50 mL (0 g Intravenous Stopped 08/24/23 2210)    Review of Systems  Respiratory:  Positive for shortness of breath.   Cardiovascular: Negative.   All other systems reviewed and are negative.  Past Medical History:  Diagnosis Date   Asthma    COPD (chronic obstructive pulmonary disease) (HCC)    Hepatitis C antibody positive in blood    HLD (hyperlipidemia)    HTN (hypertension)    On home oxygen  therapy    per pt 08-31-2021 uses 02 about every other day   Past Surgical History:  Procedure Laterality Date   INCISE AND DRAIN ABCESS     dog bite    reports that he has never smoked. He has never used smokeless tobacco. He reports current alcohol use of about 2.0 standard drinks of alcohol per week. He reports that he does not currently use drugs after having used the following drugs: "Crack" cocaine. Allergies  Allergen Reactions   Tobacco Shortness Of Breath and Other (See Comments)    CIGARETTE SMOKE TRIGGERS THE PATIENT'S RESPIRATORY ISSUES!!   Family History  Problem Relation Age of Onset   Hypertension Mother    Breast cancer Sister    Hypertension Sister    Colon cancer Brother  Colon polyps Neg Hx    Esophageal cancer Neg Hx    Rectal cancer Neg Hx    Stomach cancer Neg Hx    Prior to Admission medications   Medication Sig Start Date End Date Taking? Authorizing Provider  acetaminophen  (TYLENOL ) 500 MG tablet Take 500 mg by mouth as needed for mild pain (pain score 1-3) or headache. Patient not taking: Reported on 07/24/2023    [provider]  albuterol  (VENTOLIN  HFA) 108 (90 Base) MCG/ACT inhaler Inhale 2 puffs into the lungs every 6 (six) hours as needed for wheezing or shortness of breath. 07/24/23   Lawrance Presume, MD  arformoterol   (BROVANA ) 15 MCG/2ML NEBU Inhale 2 mLs (15 mcg total) into the lungs 2 (two) times daily. Patient not taking: Reported on 07/24/2023 07/20/23   Mordecai Applebaum, MD  atorvastatin  (LIPITOR) 10 MG tablet Take 1 tablet (10 mg total) by mouth daily. 07/24/23   Lawrance Presume, MD  budesonide  (PULMICORT ) 0.25 MG/2ML nebulizer solution Take 2 mLs (0.25 mg total) by nebulization 2 (two) times daily. Patient not taking: Reported on 07/24/2023 07/20/23   Mordecai Applebaum, MD  famotidine  (PEPCID ) 20 MG tablet Take 1 tablet (20 mg total) by mouth 2 (two) times daily. Patient not taking: Reported on 07/24/2023 06/28/23   Lawrance Presume, MD  folic acid  (FOLVITE ) 1 MG tablet Take 1 tablet (1 mg total) by mouth daily. 07/24/23   Lawrance Presume, MD  ipratropium-albuterol  (DUONEB) 0.5-2.5 (3) MG/3ML SOLN Take 3 mLs by nebulization every 6 (six) hours as needed. 07/24/23   Lawrance Presume, MD  pantoprazole  (PROTONIX ) 40 MG tablet Take 1 tablet (40 mg total) by mouth daily at 6 (six) AM. 07/24/23   Lawrance Presume, MD  sodium chloride  (OCEAN) 0.65 % SOLN nasal spray Place 1 spray into both nostrils as needed for congestion. 07/24/23   Lawrance Presume, MD  thiamine  (VITAMIN B1) 100 MG tablet Take 1 tablet (100 mg total) by mouth daily. 07/24/23   Lawrance Presume, MD  umeclidinium bromide  (INCRUSE ELLIPTA ) 62.5 MCG/ACT AEPB Inhale 1 puff into the lungs daily. 07/24/23   Lawrance Presume, MD                                                                                 Vitals:   08/24/23 2117 08/24/23 2200 08/24/23 2231 08/24/23 2300  BP: 115/85 (!) 122/105 (!) 120/100 (!) 139/94  Pulse: 88 79 77 78  Resp: (!) 22 19 18 16   Temp: 98.4 F (36.9 C)     TempSrc: Oral     SpO2: 98% 96% 97% 96%  Weight:      Height:       Physical Exam Vitals and nursing note reviewed.  Constitutional:      General: He is not in acute distress. HENT:     Head: Normocephalic and atraumatic.     Right Ear: Hearing normal.      Left Ear: Hearing normal.     Nose: No nasal deformity.     Mouth/Throat:     Lips: Pink.  Eyes:     General: Lids are normal.  Extraocular Movements: Extraocular movements intact.  Cardiovascular:     Rate and Rhythm: Normal rate and regular rhythm.     Heart sounds: Normal heart sounds.  Pulmonary:     Effort: Pulmonary effort is normal.     Breath sounds: Normal breath sounds.  Abdominal:     General: Bowel sounds are normal. There is no distension.     Palpations: Abdomen is soft. There is no mass.     Tenderness: There is no abdominal tenderness.  Musculoskeletal:     Right lower leg: No edema.     Left lower leg: No edema.  Skin:    General: Skin is warm.  Neurological:     General: No focal deficit present.     Mental Status: He is alert and oriented to person, place, and time.     Cranial Nerves: Cranial nerves 2-12 are intact.  Psychiatric:        Speech: Speech normal.     Labs on Admission: I have personally reviewed following labs and imaging studies CBC: Recent Labs  Lab 08/24/23 1427  WBC 5.6  NEUTROABS 2.1  HGB 14.8  HCT 44.1  MCV 102.3*  PLT 286   Basic Metabolic Panel: Recent Labs  Lab 08/24/23 1427  NA 142  K 3.9  CL 107  CO2 23  GLUCOSE 103*  BUN 11  CREATININE 0.96  CALCIUM  9.7   GFR: Estimated Creatinine Clearance: 42.5 mL/min (by C-G formula based on SCr of 0.96 mg/dL). Liver Function Tests: Recent Labs  Lab 08/24/23 1427  AST 23  ALT 12  ALKPHOS 88  BILITOT 1.3*  PROT 7.4  ALBUMIN 4.3   No results for input(s): "LIPASE", "AMYLASE" in the last 168 hours. No results for input(s): "AMMONIA" in the last 168 hours. Coagulation Profile: No results for input(s): "INR", "PROTIME" in the last 168 hours. Cardiac Enzymes: No results for input(s): "CKTOTAL", "CKMB", "CKMBINDEX", "TROPONINI" in the last 168 hours. BNP (last 3 results) No results for input(s): "PROBNP" in the last 8760 hours. HbA1C: No results for input(s):  "HGBA1C" in the last 72 hours. CBG: No results for input(s): "GLUCAP" in the last 168 hours. Lipid Profile: No results for input(s): "CHOL", "HDL", "LDLCALC", "TRIG", "CHOLHDL", "LDLDIRECT" in the last 72 hours. Thyroid  Function Tests: No results for input(s): "TSH", "T4TOTAL", "FREET4", "T3FREE", "THYROIDAB" in the last 72 hours. Anemia Panel: No results for input(s): "VITAMINB12", "FOLATE", "FERRITIN", "TIBC", "IRON", "RETICCTPCT" in the last 72 hours. Urine analysis:    Component Value Date/Time   COLORURINE YELLOW 05/13/2023 2334   APPEARANCEUR CLEAR 05/13/2023 2334   LABSPEC 1.020 05/13/2023 2334   PHURINE 5.0 05/13/2023 2334   GLUCOSEU 50 (A) 05/13/2023 2334   HGBUR SMALL (A) 05/13/2023 2334   BILIRUBINUR NEGATIVE 05/13/2023 2334   KETONESUR NEGATIVE 05/13/2023 2334   PROTEINUR NEGATIVE 05/13/2023 2334   NITRITE NEGATIVE 05/13/2023 2334   LEUKOCYTESUR NEGATIVE 05/13/2023 2334   Radiological Exams on Admission: DG Chest 2 View Result Date: 08/24/2023 CLINICAL DATA:  Shortness of breath. EXAM: CHEST - 2 VIEW COMPARISON:  Jul 20, 2023. FINDINGS: The heart size and mediastinal contours are within normal limits. Both lungs are clear. The visualized skeletal structures are unremarkable. IMPRESSION: No active cardiopulmonary disease. Electronically Signed   By: Rosalene Colon M.D.   On: 08/24/2023 15:02   Data Reviewed: Relevant notes from primary care and specialist visits, past discharge summaries as available in EHR, including Care Everywhere. Prior diagnostic testing as pertinent to current admission diagnoses,  Updated medications and problem lists for reconciliation ED course, including vitals, labs, imaging, treatment and response to treatment,Triage notes, nursing and pharmacy notes and ED provider's notes Notable results as noted in HPI.Discussed case with EDMD/ ED APP/ or Specialty MD on call and as needed.  Assessment & Plan   >>SOB/ acute respiratory failure/with hypoxia  COPD exacerbation: Will admit patient to telemetry unit with continuous cardiac monitoring and pulse oximetry.  COPD order set protocol.  PRN albuterol  and Duoneb.  SpO2: 96 % O2 Flow Rate (L/min): 4 L/min FiO2 (%): 36 % Cont rocephin  and doxycycline .  Incentive spirometer Due to cocaine and alcohol abuse we will obtain 2 d echo and assess f for any  WMA or valvular abnormality. EKG shows Atrial enlargement.    >> Polysubstance abuse: Admit patient with close monitoring for withdrawals for his alcohol abuse patient started on thiamine , magnesium  ordered and pending. CIWA protocol. Aspiration and fall precaution. Nicotine  patch. Patient counseled briefly about tobacco cessation..   >> Macrocytosis without anemia: Attribute to alcohol abuse, will obtain a B12 folate TSH level.    >>PCM: Regular diet, nutritionist consult, protein supplementation..    >> Essential hypertension: Vitals:   08/24/23 1320 08/24/23 1800 08/24/23 2117 08/24/23 2200  BP: (!) 137/94 (!) 106/90 115/85 (!) 122/105   08/24/23 2231 08/24/23 2300  BP: (!) 120/100 (!) 139/94  No BP meds.  We will follow .   >> Epistaxis: Secondary to using cocaine discussed with patient about refraining from using cocaine and risk factors including heart attacks.  Patient verbalized understanding s.   DVT prophylaxis:  Heparin. Consults:  None  Advance Care Planning:    Code Status: Full Code   Family Communication:  None  Disposition Plan:  TBD Severity of Illness: The appropriate patient status for this patient is OBSERVATION. Observation status is judged to be reasonable and necessary in order to provide the required intensity of service to ensure the patient's safety. The patient's presenting symptoms, physical exam findings, and initial radiographic and laboratory data in the context of their medical condition is felt to place them at decreased risk for further clinical deterioration. Furthermore, it is  anticipated that the patient will be medically stable for discharge from the hospital within 2 midnights of admission.   Unresulted Labs (From admission, onward)     Start     Ordered   08/25/23 0500  CBC  Tomorrow morning,   R        08/24/23 2248   08/25/23 0500  Comprehensive metabolic panel  Tomorrow morning,   R        08/24/23 2248   08/24/23 2248  Vitamin B12  Add-on,   AD        08/24/23 2248   08/24/23 2248  Folate  Add-on,   AD        08/24/23 2248   08/24/23 2247  Ethanol  Add-on,   AD        08/24/23 2248   08/24/23 2247  Magnesium   Add-on,   AD        08/24/23 2248   08/24/23 2246  CBC  (heparin)  Once,   R       Comments: Baseline for heparin therapy IF NOT ALREADY DRAWN.  Notify MD if PLT < 100 K.    08/24/23 2248   08/24/23 2246  Creatinine, serum  (heparin)  Once,   R       Comments: Baseline for heparin therapy IF NOT  ALREADY DRAWN.    08/24/23 2248   08/24/23 2233  Brain natriuretic peptide  Once,   URGENT        08/24/23 2232            Meds ordered this encounter  Medications   ipratropium-albuterol  (DUONEB) 0.5-2.5 (3) MG/3ML nebulizer solution 3 mL   methylPREDNISolone  sodium succinate (SOLU-MEDROL ) 125 mg/2 mL injection 125 mg    IV methylprednisolone  will be converted to either a q12h or q24h frequency with the same total daily dose (TDD).  Ordered Dose: 1 to 125 mg TDD; convert to: TDD q24h.  Ordered Dose: 126 to 250 mg TDD; convert to: TDD div q12h.  Ordered Dose: >250 mg TDD; DAW.   magnesium  sulfate IVPB 2 g 50 mL   albuterol  (PROVENTIL ) (2.5 MG/3ML) 0.083% nebulizer solution 5 mg   FOLLOWED BY Linked Order Group    methylPREDNISolone  sodium succinate (SOLU-MEDROL ) 125 mg/2 mL injection 60 mg     IV methylprednisolone  will be converted to either a q12h or q24h frequency with the same total daily dose (TDD).  Ordered Dose: 1 to 125 mg TDD; convert to: TDD q24h.  Ordered Dose: 126 to 250 mg TDD; convert to: TDD div q12h.  Ordered Dose: >250  mg TDD; DAW.    predniSONE  (DELTASONE ) tablet 40 mg   ipratropium-albuterol  (DUONEB) 0.5-2.5 (3) MG/3ML nebulizer solution 3 mL   albuterol  (PROVENTIL ) (2.5 MG/3ML) 0.083% nebulizer solution 2.5 mg   sodium chloride  flush (NS) 0.9 % injection 3 mL   OR Linked Order Group    acetaminophen  (TYLENOL ) tablet 650 mg    acetaminophen  (TYLENOL ) suppository 650 mg   polyethylene glycol (MIRALAX  / GLYCOLAX ) packet 17 g   DISCONTD: bisacodyl (DULCOLAX) EC tablet 5 mg   OR Linked Order Group    ondansetron  (ZOFRAN ) tablet 4 mg    ondansetron  (ZOFRAN ) injection 4 mg   DISCONTD: sodium chloride  flush (NS) 0.9 % injection 3-10 mL   sodium chloride  flush (NS) 0.9 % injection 3-10 mL   doxycycline  (VIBRA -TABS) tablet 100 mg   heparin injection 5,000 Units   guaiFENesin  (MUCINEX ) 12 hr tablet 600 mg   thiamine  (VITAMIN B1) injection 100 mg   pantoprazole  (PROTONIX ) injection 40 mg   OR Linked Order Group    LORazepam  (ATIVAN ) tablet 1-4 mg     CIWA-AR < 5 =:   0 mg     CIWA-AR 5 -10 =:   1 mg     CIWA-AR 11 -15 =:   2 mg     CIWA-AR 16 -20 =:   3 mg     CIWA-AR 16 -20 =:   Recheck CIWA-AR in 1 hour; if > 20 notify MD     CIWA-AR > 20 =:   4 mg     CIWA-AR > 20 =:   Call Rapid Response    LORazepam  (ATIVAN ) injection 1-4 mg     CIWA-AR < 5 =:   0 mg     CIWA-AR 5 -10 =:   1 mg     CIWA-AR 11 -15 =:   2 mg     CIWA-AR 16 -20 =:   3 mg     CIWA-AR 16 -20 =:   Recheck CIWA-AR in 1 hour; if > 20 notify MD     CIWA-AR > 20 =:   4 mg     CIWA-AR > 20 =:   Call Rapid Response   folic acid  (FOLVITE ) tablet 1 mg  multivitamin with minerals tablet 1 tablet    Orders Placed This Encounter  Procedures   Critical Care   Resp panel by RT-PCR (RSV, Flu A&B, Covid) Anterior Nasal Swab   DG Chest 2 View   CBC with Differential   Rapid urine drug screen (hospital performed)   Comprehensive metabolic panel   Brain natriuretic peptide   CBC   Creatinine, serum   Ethanol   CBC   Comprehensive  metabolic panel   Magnesium    Vitamin B12   Folate   Diet heart healthy/carb modified Room service appropriate? Yes; Fluid consistency: Thin   Refer to Sidebar Report:   Apply Chronic Obstructive Pulmonary Disease Care Plan   Provide Smoking / Tobacco cessation education.  Provide education material and information on community counseling.   Cardiac Monitoring Continuous x 24 hours Indications for use: Other; other indications for use: COPD exacerbation   Maintain IV access   Vital signs   Notify physician (specify)   Mobility Protocol: No Restrictions RN to initiate protocols based on patient's level of care   Refer to Sidebar Report Refer to ICU, Med-Surg, Progressive, and Step-Down Mobility Protocol Sidebars   Initiate Adult Central Line Maintenance and Catheter Protocol for patients with central line (CVC, PICC, Port, Hemodialysis, Trialysis)   If patient diabetic or glucose greater than 140 notify physician for Sliding Scale Insulin  Orders   Intake and Output   Do not place and if present remove PureWick   Initiate Oral Care Protocol   Initiate Carrier Fluid Protocol   Nurse to provide smoking / tobacco cessation education   RN may order General Admission PRN Orders utilizing "General Admission PRN medications" (through manage orders) for the following patient needs: allergy symptoms (Claritin), cold sores (Carmex), cough (Robitussin DM), eye irritation (Liquifilm Tears), hemorrhoids (Tucks), indigestion (Maalox), minor skin irritation (Hydrocortisone Cream), muscle pain Lovena Rubinstein Gay), nose irritation (saline nasal spray) and sore throat (Chloraseptic spray).   Clinical Institute Withdrawal Assessent (CIWA)   If Ativan  given, reassess Clinical Institute Withdrawal Assessment (CIWA) with blood pressure and pulse rate within 1 hour of Ativan  administration   Notify Pharmacy to change IV Ativan  to PO if tolerating POs well.   Full code   Consult to hospitalist   Nutritional services consult    Consult to Transition of Care Team   Consult to respiratory care treatment   Pharmacy consult   OT eval and treat   PT eval and treat   Pulse oximetry check with vital signs   Oxygen  therapy Mode or (Route): Nasal cannula; Liters Per Minute: 2; Keep O2 saturation between: greater than 92 %   EKG 12-Lead   ECHOCARDIOGRAM COMPLETE   Place in observation (patient's expected length of stay will be less than 2 midnights)   Aspiration precautions   Fall precautions     Author: Lavanda Porter, MD  [facility] 08/24/2023 11:49 PM

## 2023-08-24 NOTE — ED Notes (Signed)
 Pt placed on 4L [place] due to desating into the high 80s on 2L [place].

## 2023-08-24 NOTE — ED Provider Triage Note (Signed)
 Emergency Medicine Provider Triage Evaluation Note  Crit Obremski , a 68 y.o. male  was evaluated in triage.  Pt complains of intermittent epistaxis x2 days - no bleeding right now. Patient also with SOB since using cocaine yesterday. Also with some coughing. Also endorses vomiting after coughing fit yesterday. Denies fever, chest pain, diarrhea.   Review of Systems  Positive:  Negative:  Physical Exam  BP (!) 137/94 (BP Location: Right Arm)   Pulse 90   Temp 98.2 F (36.8 C)   Resp (!) 24   Ht 5\' 7"  (1.702 m)   Wt 40.8 kg   SpO2 95%   BMI 14.10 kg/m  Gen:   Awake, no distress   Resp:  Normal effort  MSK:   Moves extremities without difficulty  Other:    Medical Decision Making  Medically screening exam initiated at 2:11 PM.  Appropriate orders placed.  Krystal Delduca was informed that the remainder of the evaluation will be completed by another provider, this initial triage assessment does not replace that evaluation, and the importance of remaining in the ED until their evaluation is complete.     [place]o [place], [place] 08/24/23 1413

## 2023-08-24 NOTE — ED Provider Notes (Signed)
 [place] EMERGENCY DEPARTMENT AT [facility] Provider Note   CSN: [id number] Arrival date & time: 08/24/23  1316     History  Chief Complaint  Patient presents with   Shortness of Breath   Epistaxis    Jason Wong is a 68 y.o. male.  68 yo M with a chief complaint of difficulty breathing.  Started today.  He denies any chest pain denies fevers denies trauma.  He has been having some nosebleeds off and on.  He thinks it could be due to using cocaine.  He denies any sick contacts.  Denies fevers.  He does have a history of COPD and thinks this feels somewhat similar.   Shortness of Breath Epistaxis      Home Medications Prior to Admission medications   Medication Sig Start Date End Date Taking? Authorizing Provider  acetaminophen  (TYLENOL ) 500 MG tablet Take 500 mg by mouth as needed for mild pain (pain score 1-3) or headache. Patient not taking: Reported on 07/24/2023    [provider]  albuterol  (VENTOLIN  HFA) 108 (90 Base) MCG/ACT inhaler Inhale 2 puffs into the lungs every 6 (six) hours as needed for wheezing or shortness of breath. 07/24/23   Lawrance Presume, MD  arformoterol  (BROVANA ) 15 MCG/2ML NEBU Inhale 2 mLs (15 mcg total) into the lungs 2 (two) times daily. Patient not taking: Reported on 07/24/2023 07/20/23   Mordecai Applebaum, MD  atorvastatin  (LIPITOR) 10 MG tablet Take 1 tablet (10 mg total) by mouth daily. 07/24/23   Lawrance Presume, MD  budesonide  (PULMICORT ) 0.25 MG/2ML nebulizer solution Take 2 mLs (0.25 mg total) by nebulization 2 (two) times daily. Patient not taking: Reported on 07/24/2023 07/20/23   Mordecai Applebaum, MD  famotidine  (PEPCID ) 20 MG tablet Take 1 tablet (20 mg total) by mouth 2 (two) times daily. Patient not taking: Reported on 07/24/2023 06/28/23   Lawrance Presume, MD  folic acid  (FOLVITE ) 1 MG tablet Take 1 tablet (1 mg total) by mouth daily. 07/24/23   Lawrance Presume, MD  ipratropium-albuterol  (DUONEB) 0.5-2.5 (3)  MG/3ML SOLN Take 3 mLs by nebulization every 6 (six) hours as needed. 07/24/23   Lawrance Presume, MD  pantoprazole  (PROTONIX ) 40 MG tablet Take 1 tablet (40 mg total) by mouth daily at 6 (six) AM. 07/24/23   Lawrance Presume, MD  sodium chloride  (OCEAN) 0.65 % SOLN nasal spray Place 1 spray into both nostrils as needed for congestion. 07/24/23   Lawrance Presume, MD  thiamine  (VITAMIN B1) 100 MG tablet Take 1 tablet (100 mg total) by mouth daily. 07/24/23   Lawrance Presume, MD  umeclidinium bromide  (INCRUSE ELLIPTA ) 62.5 MCG/ACT AEPB Inhale 1 puff into the lungs daily. 07/24/23   Lawrance Presume, MD      Allergies    Tobacco    Review of Systems   Review of Systems  HENT:  Positive for nosebleeds.   Respiratory:  Positive for shortness of breath.     Physical Exam Updated Vital Signs BP 115/85 (BP Location: Right Arm)   Pulse 88   Temp 98.4 F (36.9 C) (Oral)   Resp (!) 22   Ht 5\' 7"  (1.702 m)   Wt 40.8 kg   SpO2 98%   BMI 14.10 kg/m  Physical Exam Vitals and nursing note reviewed.  Constitutional:      Appearance: He is well-developed.  HENT:     Head: Normocephalic and atraumatic.  Eyes:  Pupils: Pupils are equal, round, and reactive to light.  Neck:     Vascular: No JVD.  Cardiovascular:     Rate and Rhythm: Normal rate and regular rhythm.     Heart sounds: No murmur heard.    No friction rub. No gallop.  Pulmonary:     Effort: No respiratory distress.     Breath sounds: Wheezing present.     Comments: Diminished breath sounds in all fields with prolonged expiratory effort and wheezes. Abdominal:     General: There is no distension.     Tenderness: There is no abdominal tenderness. There is no guarding or rebound.  Musculoskeletal:        General: Normal range of motion.     Cervical back: Normal range of motion and neck supple.  Skin:    Coloration: Skin is not pale.     Findings: No rash.  Neurological:     Mental Status: He is alert and oriented to  person, place, and time.  Psychiatric:        Behavior: Behavior normal.     ED Results / Procedures / Treatments   Labs (all labs ordered are listed, but only abnormal results are displayed) Labs Reviewed  CBC WITH DIFFERENTIAL/PLATELET - Abnormal; Notable for the following components:      Result Value   MCV 102.3 (*)    MCH 34.3 (*)    Eosinophils Absolute 0.7 (*)    All other components within normal limits  RAPID URINE DRUG SCREEN, HOSP PERFORMED - Abnormal; Notable for the following components:   Cocaine POSITIVE (*)    All other components within normal limits  COMPREHENSIVE METABOLIC PANEL WITH GFR - Abnormal; Notable for the following components:   Glucose, Bld 103 (*)    Total Bilirubin 1.3 (*)    All other components within normal limits  RESP PANEL BY RT-PCR (RSV, FLU A&B, COVID)  RVPGX2  TROPONIN I (HIGH SENSITIVITY)  TROPONIN I (HIGH SENSITIVITY)    EKG EKG Interpretation Date/Time:  Thursday August 24 2023 13:22:59 EDT Ventricular Rate:  89 PR Interval:  134 QRS Duration:  82 QT Interval:  356 QTC Calculation: 433 R Axis:   83  Text Interpretation: Normal sinus rhythm Right atrial enlargement Borderline ECG No significant change since last tracing Confirmed by Albertus Hughs [id number]) on 08/24/2023 9:46:55 PM  Radiology DG Chest 2 View Result Date: 08/24/2023 CLINICAL DATA:  Shortness of breath. EXAM: CHEST - 2 VIEW COMPARISON:  Jul 20, 2023. FINDINGS: The heart size and mediastinal contours are within normal limits. Both lungs are clear. The visualized skeletal structures are unremarkable. IMPRESSION: No active cardiopulmonary disease. Electronically Signed   By: Rosalene Colon M.D.   On: 08/24/2023 15:02    Procedures .Critical Care  Performed by: Albertus Hughs, DO Authorized by: Albertus Hughs, DO   Critical care provider statement:    Critical care time (minutes):  35   Critical care time was exclusive of:  Separately billable procedures and treating other  patients   Critical care was time spent personally by me on the following activities:  Development of treatment plan with patient or surrogate, discussions with consultants, evaluation of patient's response to treatment, examination of patient, ordering and review of laboratory studies, ordering and review of radiographic studies, ordering and performing treatments and interventions, pulse oximetry, re-evaluation of patient's condition and review of old charts   Care discussed with: admitting provider       Medications Ordered in ED  Medications  albuterol  (PROVENTIL ) (2.5 MG/3ML) 0.083% nebulizer solution 5 mg (has no administration in time range)  ipratropium-albuterol  (DUONEB) 0.5-2.5 (3) MG/3ML nebulizer solution 3 mL (3 mLs Nebulization Given 08/24/23 2158)  methylPREDNISolone  sodium succinate (SOLU-MEDROL ) 125 mg/2 mL injection 125 mg (125 mg Intravenous Given 08/24/23 2147)  magnesium  sulfate IVPB 2 g 50 mL (2 g Intravenous New Bag/Given 08/24/23 2155)    ED Course/ Medical Decision Making/ A&P                                 Medical Decision Making Amount and/or Complexity of Data Reviewed Labs: ordered.  Risk Prescription drug management.   68 yo M with a chief complaints of difficulty breathing.  This has been going on since this morning.  Patient has a history of COPD and thinks this feels similar.  Blood work and chest x-ray performed through the triage process.  No acute anemia, no significant electrolyte abnormalities.  UDS is positive for cocaine as it has been previously.  Chest x-ray independently interpreted by me without focal infiltrate or pneumothorax.  Patient's lung exam consistent with COPD.  Will give 3 DuoNebs back-to-back steroids magnesium  reassess.  Patient with significant improvement of work of breathing.  Improved lung aeration.  Patient still requiring 4 L of oxygen .  Not on oxygen  at home.  Will discuss with medicine for admission.  The patients results  and plan were reviewed and discussed.   Any x-rays performed were independently reviewed by myself.   Differential diagnosis were considered with the presenting HPI.  Medications  albuterol  (PROVENTIL ) (2.5 MG/3ML) 0.083% nebulizer solution 5 mg (has no administration in time range)  ipratropium-albuterol  (DUONEB) 0.5-2.5 (3) MG/3ML nebulizer solution 3 mL (3 mLs Nebulization Given 08/24/23 2158)  methylPREDNISolone  sodium succinate (SOLU-MEDROL ) 125 mg/2 mL injection 125 mg (125 mg Intravenous Given 08/24/23 2147)  magnesium  sulfate IVPB 2 g 50 mL (2 g Intravenous New Bag/Given 08/24/23 2155)    Vitals:   08/24/23 1331 08/24/23 1332 08/24/23 1800 08/24/23 2117  BP:   (!) 106/90 115/85  Pulse:   98 88  Resp:   18 (!) 22  Temp:   98.3 F (36.8 C) 98.4 F (36.9 C)  TempSrc:    Oral  SpO2:   (!) 89% 98%  Weight: 36.3 kg 40.8 kg    Height: 5\' 7"  (1.702 m)       Final diagnoses:  COPD exacerbation (HCC)    Admission/ observation were discussed with the admitting physician, patient and/or family and they are comfortable with the plan.          Final Clinical Impression(s) / ED Diagnoses Final diagnoses:  COPD exacerbation [facility])    Rx / DC Orders ED Discharge Orders     None         Albertus Hughs, DO 08/24/23 2219

## 2023-08-25 ENCOUNTER — Observation Stay (HOSPITAL_COMMUNITY)

## 2023-08-25 ENCOUNTER — Encounter (HOSPITAL_COMMUNITY): Payer: Self-pay | Admitting: Internal Medicine

## 2023-08-25 ENCOUNTER — Other Ambulatory Visit: Payer: Self-pay

## 2023-08-25 DIAGNOSIS — E785 Hyperlipidemia, unspecified: Secondary | ICD-10-CM | POA: Diagnosis present

## 2023-08-25 DIAGNOSIS — E43 Unspecified severe protein-calorie malnutrition: Secondary | ICD-10-CM | POA: Diagnosis present

## 2023-08-25 DIAGNOSIS — F1010 Alcohol abuse, uncomplicated: Secondary | ICD-10-CM | POA: Diagnosis present

## 2023-08-25 DIAGNOSIS — Z8249 Family history of ischemic heart disease and other diseases of the circulatory system: Secondary | ICD-10-CM | POA: Diagnosis not present

## 2023-08-25 DIAGNOSIS — N400 Enlarged prostate without lower urinary tract symptoms: Secondary | ICD-10-CM | POA: Diagnosis present

## 2023-08-25 DIAGNOSIS — Z716 Tobacco abuse counseling: Secondary | ICD-10-CM | POA: Diagnosis not present

## 2023-08-25 DIAGNOSIS — J9601 Acute respiratory failure with hypoxia: Secondary | ICD-10-CM | POA: Diagnosis present

## 2023-08-25 DIAGNOSIS — Z9981 Dependence on supplemental oxygen: Secondary | ICD-10-CM | POA: Diagnosis not present

## 2023-08-25 DIAGNOSIS — K219 Gastro-esophageal reflux disease without esophagitis: Secondary | ICD-10-CM | POA: Diagnosis present

## 2023-08-25 DIAGNOSIS — R0602 Shortness of breath: Secondary | ICD-10-CM | POA: Diagnosis present

## 2023-08-25 DIAGNOSIS — I5021 Acute systolic (congestive) heart failure: Secondary | ICD-10-CM | POA: Diagnosis not present

## 2023-08-25 DIAGNOSIS — I119 Hypertensive heart disease without heart failure: Secondary | ICD-10-CM | POA: Diagnosis present

## 2023-08-25 DIAGNOSIS — Z1152 Encounter for screening for COVID-19: Secondary | ICD-10-CM | POA: Diagnosis not present

## 2023-08-25 DIAGNOSIS — F1410 Cocaine abuse, uncomplicated: Secondary | ICD-10-CM | POA: Diagnosis present

## 2023-08-25 DIAGNOSIS — Z681 Body mass index (BMI) 19 or less, adult: Secondary | ICD-10-CM | POA: Diagnosis not present

## 2023-08-25 DIAGNOSIS — Z7951 Long term (current) use of inhaled steroids: Secondary | ICD-10-CM | POA: Diagnosis not present

## 2023-08-25 DIAGNOSIS — D7589 Other specified diseases of blood and blood-forming organs: Secondary | ICD-10-CM | POA: Diagnosis present

## 2023-08-25 DIAGNOSIS — J441 Chronic obstructive pulmonary disease with (acute) exacerbation: Secondary | ICD-10-CM | POA: Diagnosis present

## 2023-08-25 DIAGNOSIS — F17210 Nicotine dependence, cigarettes, uncomplicated: Secondary | ICD-10-CM | POA: Diagnosis present

## 2023-08-25 LAB — COMPREHENSIVE METABOLIC PANEL WITH GFR
ALT: 11 U/L (ref 0–44)
AST: 21 U/L (ref 15–41)
Albumin: 3.7 g/dL (ref 3.5–5.0)
Alkaline Phosphatase: 80 U/L (ref 38–126)
Anion gap: 9 (ref 5–15)
BUN: 12 mg/dL (ref 8–23)
CO2: 22 mmol/L (ref 22–32)
Calcium: 9.3 mg/dL (ref 8.9–10.3)
Chloride: 108 mmol/L (ref 98–111)
Creatinine, Ser: 0.97 mg/dL (ref 0.61–1.24)
GFR, Estimated: >60 mL/min (ref >60)
Glucose, Bld: 179 mg/dL — ABNORMAL HIGH (ref 70–99)
Potassium: 3.6 mmol/L (ref 3.5–5.1)
Sodium: 139 mmol/L (ref 135–145)
Total Bilirubin: 0.6 mg/dL (ref 0.0–1.2)
Total Protein: 6.8 g/dL (ref 6.5–8.1)

## 2023-08-25 LAB — ECHOCARDIOGRAM COMPLETE
AR max vel: 2.43 cm2
AV Peak grad: 5.7 mmHg
Ao pk vel: 1.19 m/s
Area-P 1/2: 3.65 cm2
Height: 67 in
S' Lateral: 2.5 cm
Weight: 1440 [oz_av]

## 2023-08-25 LAB — MAGNESIUM: Magnesium: 2.4 mg/dL (ref 1.7–2.4)

## 2023-08-25 LAB — CBC
HCT: 41 % (ref 39.0–52.0)
HCT: 41.8 % (ref 39.0–52.0)
Hemoglobin: 13.8 g/dL (ref 13.0–17.0)
Hemoglobin: 13.6 g/dL (ref 13.0–17.0)
MCH: 33 pg (ref 26.0–34.0)
MCH: 33.2 pg (ref 26.0–34.0)
MCHC: 33.7 g/dL (ref 30.0–36.0)
MCHC: 32.5 g/dL (ref 30.0–36.0)
MCV: 98.1 fL (ref 80.0–100.0)
MCV: 102 fL — ABNORMAL HIGH (ref 80.0–100.0)
Platelets: 279 10*3/uL (ref 150–400)
Platelets: 268 10*3/uL (ref 150–400)
RBC: 4.18 MIL/uL — ABNORMAL LOW (ref 4.22–5.81)
RBC: 4.1 MIL/uL — ABNORMAL LOW (ref 4.22–5.81)
RDW: 13.7 % (ref 11.5–15.5)
RDW: 13.6 % (ref 11.5–15.5)
WBC: 4.3 10*3/uL (ref 4.0–10.5)
WBC: 3.1 10*3/uL — ABNORMAL LOW (ref 4.0–10.5)
nRBC: 0 % (ref 0.0–0.2)
nRBC: 0 % (ref 0.0–0.2)

## 2023-08-25 LAB — VITAMIN B12: Vitamin B-12: 290 pg/mL (ref 180–914)

## 2023-08-25 LAB — CREATININE, SERUM
Creatinine, Ser: 0.95 mg/dL (ref 0.61–1.24)
GFR, Estimated: >60 mL/min (ref >60)

## 2023-08-25 LAB — ETHANOL: Alcohol, Ethyl (B): <15 mg/dL (ref <15)

## 2023-08-25 LAB — PROCALCITONIN: Procalcitonin: <0.1 ng/mL

## 2023-08-25 LAB — FOLATE: Folate: 23.5 ng/mL (ref >5.9)

## 2023-08-25 LAB — BRAIN NATRIURETIC PEPTIDE: B Natriuretic Peptide: 46.1 pg/mL (ref 0.0–100.0)

## 2023-08-25 MED ORDER — ENSURE PLUS HIGH PROTEIN PO LIQD
237.0000 mL | Freq: Three times a day (TID) | ORAL | Status: DC
  Administered 2023-08-25 – 2023-08-26 (×2): 237 mL via ORAL

## 2023-08-25 MED ORDER — PANTOPRAZOLE SODIUM 40 MG PO TBEC
40.0000 mg | DELAYED_RELEASE_TABLET | Freq: Two times a day (BID) | ORAL | Status: DC
  Administered 2023-08-25 – 2023-08-26 (×2): 40 mg via ORAL
  Filled 2023-08-25 (×2): qty 1

## 2023-08-25 MED ORDER — HYDRALAZINE HCL 20 MG/ML IJ SOLN: 10.0000 mg | INTRAMUSCULAR | Status: DC | PRN

## 2023-08-25 MED ORDER — THIAMINE MONONITRATE 100 MG PO TABS
100.0000 mg | ORAL_TABLET | Freq: Every day | ORAL | Status: DC
  Administered 2023-08-25 – 2023-08-26 (×2): 100 mg via ORAL
  Filled 2023-08-25 (×2): qty 1

## 2023-08-25 MED ORDER — SENNOSIDES-DOCUSATE SODIUM 8.6-50 MG PO TABS: 1.0000 | ORAL_TABLET | Freq: Every evening | ORAL | Status: DC | PRN

## 2023-08-25 MED ORDER — IPRATROPIUM-ALBUTEROL 0.5-2.5 (3) MG/3ML IN SOLN
3.0000 mL | Freq: Three times a day (TID) | RESPIRATORY_TRACT | Status: DC
  Administered 2023-08-25 – 2023-08-26 (×4): 3 mL via RESPIRATORY_TRACT
  Filled 2023-08-25 (×4): qty 3

## 2023-08-25 NOTE — Care Management Obs Status (Signed)
 MEDICARE OBSERVATION STATUS NOTIFICATION   Patient Details  Name: Jason Wong MRN: [id number] Date of Birth: [birth year]   Medicare Observation Status Notification Given:  Yes    Nena Hampe 08/25/2023, 11:00 AM

## 2023-08-25 NOTE — Evaluation (Signed)
 Physical Therapy Evaluation/ discharge Patient Details Name: Jason Wong MRN: [id number] DOB: Sep 10, 1955 Today's Date: 08/25/2023  History of Present Illness  68 y.o. male adm 08/24/23 with SOB and epistaxis, cocaine (+). PMH:  HTN, Asthma, COPD, tobacco abuse, HLD, cocaine abuse , ETOH abuse, hep C  Clinical Impression  Pt pleasant, reports smoking 1x/day and staying with friend currently but will return to living with nephew. He uses cane at baseline but this date able to perform transfers and long hall gait without UB support and SPO2 >93% on RA throughout. Pt at baseline without further therapy needs, will sign off and encouraged continued daily ambulation.         If plan is discharge home, recommend the following:     Can travel by private vehicle        Equipment Recommendations None recommended by PT  Recommendations for Other Services       Functional Status Assessment Patient has not had a recent decline in their functional status     Precautions / Restrictions Precautions Precautions: None      Mobility  Bed Mobility Overal bed mobility: Independent                  Transfers Overall transfer level: Independent                      Ambulation/Gait Ambulation/Gait assistance: Independent         Gait velocity interpretation: 1.31 - 2.62 ft/sec, 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     Tilt Bed    Modified Rankin (Stroke Patients Only)       Balance Overall balance assessment: Modified Independent                                           Pertinent Vitals/Pain Pain Assessment Pain Assessment: No/denies pain    Home Living Family/patient expects to be discharged to:: Private residence Living Arrangements: Other relatives Available Help at Discharge: Family;Available 24 hours/day Type of Home: House Home Access: Stairs to enter   [employer] of Steps: 2   Home Layout: Two level;Able to live on main level with bedroom/bathroom Home Equipment: Jeananne Mighty - single point;Cane - Programmer, applications (2 wheels);Shower seat      Prior Function Prior Level of Function : Independent/Modified Independent             Mobility Comments: uses cane at times, walks in community ADLs Comments: ind, does not drive     Extremity/Trunk Assessment   Upper Extremity Assessment Upper Extremity Assessment: Overall WFL for tasks assessed    Lower Extremity Assessment Lower Extremity Assessment: Overall WFL for tasks assessed    Cervical / Trunk Assessment Cervical / Trunk Assessment: Normal  Communication   Communication Communication: No apparent difficulties    Cognition Arousal: Alert Behavior During Therapy: WFL for tasks assessed/performed   PT - Cognitive impairments: No apparent impairments                         Following commands: Intact       Cueing Cueing Techniques: Verbal cues     General Comments      Exercises     Assessment/Plan    PT Assessment  Patient does not need any further PT services  PT Problem List         PT Treatment Interventions      PT Goals (Current goals can be found in the Care Plan section)  Acute Rehab PT Goals PT Goal Formulation: All assessment and education complete, DC therapy    Frequency       Co-evaluation               AM-PAC PT "6 Clicks" Mobility  Outcome Measure Help needed turning from your back to your side while in a flat bed without using bedrails?: None Help needed moving from lying on your back to sitting on the side of a flat bed without using bedrails?: None Help needed moving to and from a bed to a chair (including a wheelchair)?: None Help needed standing up from a chair using your arms (e.g., wheelchair or bedside chair)?: None Help needed to walk in hospital room?: None Help needed climbing 3-5 steps with a railing? :  None 6 Click Score: 24    End of Session   Activity Tolerance: Patient tolerated treatment well Patient left: in chair Nurse Communication: Mobility status PT Visit Diagnosis: Other abnormalities of gait and mobility (R26.89)    Time: 7829-5621 PT Time Calculation (min) (ACUTE ONLY): 15 min   Charges:   PT Evaluation $PT Eval Low Complexity: 1 Low   PT General Charges $$ ACUTE PT VISIT: 1 Visit         Annis Baseman, PT Acute Rehabilitation Services Office: [phone]   Jackey Mary Arisa Congleton 08/25/2023, 10:43 AM

## 2023-08-25 NOTE — Progress Notes (Signed)
 Echocardiogram 2D Echocardiogram has been performed.  Jason Wong 08/25/2023, 12:51 PM

## 2023-08-25 NOTE — Progress Notes (Signed)
 OT Cancellation Note  Patient Details Name: Jason Wong MRN: [id number] DOB: June 24, 1955   Cancelled Treatment:    Reason Eval/Treat Not Completed: OT screened, no needs identified, will sign off (Discussed with PT.)  [facility] 08/25/2023, 9:41 AM Milburn Aliment, OT/L   Acute OT Clinical Specialist Acute Rehabilitation Services Pager [phone] Office [phone]

## 2023-08-25 NOTE — Progress Notes (Signed)
 Initial Nutrition Assessment  DOCUMENTATION CODES:  Underweight, Severe malnutrition in context of social or environmental circumstances  INTERVENTION:  Liberalize diet to regular Encourage PO intake MVI with minerals daily Ensure Plus High Protein po TID, each supplement provides 350 kcal and 20 grams of protein.  NUTRITION DIAGNOSIS:   Severe Malnutrition related to social / environmental circumstances (alcohol and drug use) as evidenced by severe fat depletion, severe muscle depletion.  GOAL:  Patient will meet greater than or equal to 90% of their needs  MONITOR:  PO intake, Supplement acceptance, I & O's, Labs, Weight trends  REASON FOR ASSESSMENT:  Consult Assessment of nutrition requirement/status  ASSESSMENT:  Pt with hx of HTN, COPD, alcohol abuse, and drug abuse (crack cocaine) presented to ED with SOB and nose bleeds.  Pt resting in bed at the time of assessment. Very enthusiastic when discussing his appetite. States he ate everything on his tray and asked if he could get a few packs of graham crackers to have as a snack. Also agreeable and enthusiastic about ensure.   On exam, pt very thin with loss of fat and muscle stores consistent with malnutrition. Pt reports usual weight of about 120 lbs. Current weight much lower, obtained bed weight that was more consistent with visual inspection and weight hx.  Admit weight: 36.3 kg  Current weight: 40.8 kg   Nutritionally Relevant Medications: Scheduled Meds:  doxycycline   100 mg Oral Q12H   folic acid   1 mg Oral Daily   methylPREDNISolone    60 mg Intravenous Q12H   multivitamin with minerals  1 tablet Oral Daily   pantoprazole   40 mg Oral BID AC   thiamine   100 mg Oral Daily   PRN Meds: ondansetron , polyethylene glycol, senna-docusate  Labs Reviewed: CBG ranges from 103-179 mg/dL over the last 24 hours HgbA1c 5.4% (05/17/23)  NUTRITION - FOCUSED PHYSICAL EXAM: Flowsheet Row Most Recent Value  Orbital Region  Severe depletion  Upper Arm Region Severe depletion  Thoracic and Lumbar Region Severe depletion  Buccal Region Severe depletion  Temple Region Severe depletion  Clavicle Bone Region Severe depletion  Clavicle and Acromion Bone Region Severe depletion  Scapular Bone Region Severe depletion  Dorsal Hand Severe depletion  Patellar Region Severe depletion  Anterior Thigh Region Severe depletion  Posterior Calf Region Severe depletion  Edema (RD Assessment) None  Hair Reviewed  Eyes Reviewed  Mouth Reviewed  [missing teeth]  Skin Reviewed  Nails Reviewed   Diet Order:   Diet Order             Diet regular Room service appropriate? Yes with Assist; Fluid consistency: Thin  Diet effective now                   EDUCATION NEEDS:  Education needs have been addressed  Skin:  Skin Assessment: Reviewed RN Assessment  Last BM:  6/5  Height:  Ht Readings from Last 1 Encounters:  08/24/23 5\' 7"  (1.702 m)    Weight:  Wt Readings from Last 1 Encounters:  08/25/23 51.3 kg    Ideal Body Weight:  67.3 kg  BMI:  Body mass index is 17.71 kg/m.  Estimated Nutritional Needs:  Kcal:  1500-1800 kcal/d Protein:  70-90g/d Fluid:  >1.8L/d    Edwena Graham, RD, LDN Registered Dietitian II Please reach out via secure chat

## 2023-08-25 NOTE — Progress Notes (Signed)
 PHARMACIST - PHYSICIAN COMMUNICATION  DR:   Ariel Begun  CONCERNING: IV to Oral Route Change Policy  RECOMMENDATION: This patient is receiving thiamine  by the intravenous route.  Based on criteria approved by the Pharmacy and Therapeutics Committee, the intravenous medication(s) is/are being converted to the equivalent oral dose form(s).   DESCRIPTION: These criteria include: The patient is eating (either orally or via tube) and/or has been taking other orally administered medications for a least 24 hours The patient has no evidence of active gastrointestinal bleeding or impaired GI absorption (gastrectomy, short bowel, patient on TNA or NPO).  If you have questions about this conversion, please contact the Pharmacy Department  []   [phone] )  [facility]ine Done []   [phone] )  [facility] [x]   [phone] )  [facility] Benes []   [phone] )  [facility] []   [phone] )  [facility]

## 2023-08-25 NOTE — Plan of Care (Signed)

## 2023-08-25 NOTE — Hospital Course (Addendum)
 Brief Narrative:  68 year old with history of chronic tobacco use/COPD, HTN, HLD, GERD, cocaine use, alcohol abuse comes to the hospital with epistaxis and shortness of breath which started about 2 days prior to the hospitalization.  In the ER flu, RSV/COVID were negative.  Toxicology was positive for cocaine.  BNP is normal.  In the ER received bronchodilators, steroids, magnesium .  Chest x-ray did not show any other acute pathology.  Upon admission also started on antibiotics, steroids, bronchodilators, I-S and flutter valve.  Echo shows preserved EF of 60%. Today his breathing is doing much better.  Will transition him to p.o. steroids, given p.o. antibiotics to finish course.  Medically stable for discharge   Assessment & Plan:  Principal Problem:   SOB (shortness of breath)   Acute respiratory distress COPD exacerbation - Transition to p.o. steroids, will give bronchodilators at home along with doxycycline .  Counseled to quit smoking cigarettes - Echo shows preserved EF 60%  Polysubstance abuse Alcohol abuse - Alcohol withdrawal protocol.  Counseled to quit using alcohol. -Folic acid , thiamine , multivitamin  Macrocytosis without anemia - Likely from alcohol use. -Folate, B12 are normal.   DVT prophylaxis: heparin  injection 5,000 Units Start: 08/24/23 2300    Code Status: Full Code Family Communication:   DC today after PT eval  Subjective: Doing ok today no new complaints. Wants to go home.    Examination:  General exam: Appears calm and comfortable  Respiratory system: Clear to auscultation bilaterally Cardiovascular system: S1 & S2 heard, RRR. No JVD, murmurs, rubs, gallops or clicks. No pedal edema. Gastrointestinal system: Abdomen is nondistended, soft and nontender. No organomegaly or masses felt. Normal bowel sounds heard. Central nervous system: Alert and oriented. No focal neurological deficits. Extremities: Symmetric 5 x 5 power. Skin: No rashes, lesions or  ulcers Psychiatry: Judgement and insight appear normal. Mood & affect appropriate.

## 2023-08-25 NOTE — Progress Notes (Signed)
 PROGRESS NOTE    Jason Wong  ZOX:[id number] DOB: Jul 28, 1955 DOA: 08/24/2023 PCP: Lawrance Presume, MD    Brief Narrative:  68 year old with history of chronic tobacco use/COPD, HTN, HLD, GERD, cocaine use, alcohol abuse comes to the hospital with epistaxis and shortness of breath which started about 2 days prior to the hospitalization.  In the ER flu, RSV/COVID were negative.  Toxicology was positive for cocaine.  BNP is normal.  In the ER received bronchodilators, steroids, magnesium .  Chest x-ray did not show any other acute pathology   Assessment & Plan:  Principal Problem:   SOB (shortness of breath)   Acute respiratory distress COPD exacerbation - For now started patient on steroids, bronchodilators, I-S/flutter valve - Check procalcitonin - Empiric Rocephin  and doxycycline . -Admitting provider ordered echocardiogram as well.  Polysubstance abuse Alcohol abuse - Alcohol withdrawal protocol.  Counseled to quit using alcohol. -Folic acid , thiamine , multivitamin  Macrocytosis without anemia - Likely from alcohol use. -Folate, B12 are normal.   DVT prophylaxis: heparin injection 5,000 Units Start: 08/24/23 2300    Code Status: Full Code Family Communication:   Continue hospital stay for at least next 1-2 days    Subjective:  Breathing is slightly better today but still having exertional dyspnea.  Examination:  General exam: Appears calm and comfortable  Respiratory system: Diminished breath sounds bilaterally Cardiovascular system: S1 & S2 heard, RRR. No JVD, murmurs, rubs, gallops or clicks. No pedal edema. Gastrointestinal system: Abdomen is nondistended, soft and nontender. No organomegaly or masses felt. Normal bowel sounds heard. Central nervous system: Alert and oriented. No focal neurological deficits. Extremities: Symmetric 5 x 5 power. Skin: No rashes, lesions or ulcers Psychiatry: Judgement and insight appear normal. Mood & affect appropriate.                 Diet Orders (From admission, onward)     Start     Ordered   08/24/23 2246  Diet heart healthy/carb modified Room service appropriate? Yes; Fluid consistency: Thin  Diet effective now       Question Answer Comment  Diet-HS Snack? Nothing   Room service appropriate? Yes   Fluid consistency: Thin      08/24/23 2248            Objective: Vitals:   08/25/23 0005 08/25/23 0218 08/25/23 0553 08/25/23 0807  BP: (!) 156/97  (!) 134/92 (!) 126/91  Pulse: 83  67 65  Resp:   17 19  Temp: 97.6 F (36.4 C)  97.6 F (36.4 C) 97.6 F (36.4 C)  TempSrc:   Oral   SpO2: 97% 95% 98% 98%  Weight:      Height:        Intake/Output Summary (Last 24 hours) at 08/25/2023 0851 Last data filed at 08/25/2023 0300 Gross per 24 hour  Intake 700 ml  Output 300 ml  Net 400 ml   Filed Weights   08/24/23 1331 08/24/23 1332  Weight: 36.3 kg 40.8 kg    Scheduled Meds:  doxycycline   100 mg Oral Q12H   folic acid   1 mg Oral Daily   heparin  5,000 Units Subcutaneous Q8H   ipratropium-albuterol   3 mL Nebulization TID   methylPREDNISolone  (SOLU-MEDROL ) injection  60 mg Intravenous Q12H   Followed by   Cecily Cohen ON 08/26/2023] predniSONE   40 mg Oral Q breakfast   multivitamin with minerals  1 tablet Oral Daily   pantoprazole   40 mg Oral BID AC   sodium chloride  flush  3 mL Intravenous Q12H   thiamine   100 mg Oral Daily   Continuous Infusions:  Nutritional status     Body mass index is 14.1 kg/m.  Data Reviewed:   CBC: Recent Labs  Lab 08/24/23 1427 08/25/23 0041 08/25/23 0315  WBC 5.6 4.3 3.1*  NEUTROABS 2.1  --   --   HGB 14.8 13.8 13.6  HCT 44.1 41.0 41.8  MCV 102.3* 98.1 102.0*  PLT 286 279 268   Basic Metabolic Panel: Recent Labs  Lab 08/24/23 1427 08/25/23 0041 08/25/23 0315  NA 142  --  139  K 3.9  --  3.6  CL 107  --  108  CO2 23  --  22  GLUCOSE 103*  --  179*  BUN 11  --  12  CREATININE 0.96 0.95 0.97  CALCIUM  9.7  --  9.3  MG  --  2.4   --    GFR: Estimated Creatinine Clearance: 42.1 mL/min (by C-G formula based on SCr of 0.97 mg/dL). Liver Function Tests: Recent Labs  Lab 08/24/23 1427 08/25/23 0315  AST 23 21  ALT 12 11  ALKPHOS 88 80  BILITOT 1.3* 0.6  PROT 7.4 6.8  ALBUMIN 4.3 3.7   No results for input(s): "LIPASE", "AMYLASE" in the last 168 hours. No results for input(s): "AMMONIA" in the last 168 hours. Coagulation Profile: No results for input(s): "INR", "PROTIME" in the last 168 hours. Cardiac Enzymes: No results for input(s): "CKTOTAL", "CKMB", "CKMBINDEX", "TROPONINI" in the last 168 hours. BNP (last 3 results) No results for input(s): "PROBNP" in the last 8760 hours. HbA1C: No results for input(s): "HGBA1C" in the last 72 hours. CBG: No results for input(s): "GLUCAP" in the last 168 hours. Lipid Profile: No results for input(s): "CHOL", "HDL", "LDLCALC", "TRIG", "CHOLHDL", "LDLDIRECT" in the last 72 hours. Thyroid  Function Tests: No results for input(s): "TSH", "T4TOTAL", "FREET4", "T3FREE", "THYROIDAB" in the last 72 hours. Anemia Panel: Recent Labs    08/25/23 0041  VITAMINB12 290  FOLATE 23.5   Sepsis Labs: No results for input(s): "PROCALCITON", "LATICACIDVEN" in the last 168 hours.  Recent Results (from the past 240 hours)  Resp panel by RT-PCR (RSV, Flu A&B, Covid) Anterior Nasal Swab     Status: None   Collection Time: 08/24/23  2:15 PM   Specimen: Anterior Nasal Swab  Result Value Ref Range Status   SARS Coronavirus 2 by RT PCR NEGATIVE NEGATIVE Final   Influenza A by PCR NEGATIVE NEGATIVE Final   Influenza B by PCR NEGATIVE NEGATIVE Final    Comment: (NOTE) The Xpert Xpress SARS-CoV-2/FLU/RSV plus assay is intended as an aid in the diagnosis of influenza from Nasopharyngeal swab specimens and should not be used as a sole basis for treatment. Nasal washings and aspirates are unacceptable for Xpert Xpress SARS-CoV-2/FLU/RSV testing.  Fact Sheet for  Patients: BloggerCourse.com  Fact Sheet for Healthcare Providers: SeriousBroker.it  This test is not yet approved or cleared by the [place]  FDA and has been authorized for detection and/or diagnosis of SARS-CoV-2 by FDA under an Emergency Use Authorization (EUA). This EUA will remain in effect (meaning this test can be used) for the duration of the COVID-19 declaration under Section 564(b)(1) of the Act, 21 U.S.C. section 360bbb-3(b)(1), unless the authorization is terminated or revoked.     Resp Syncytial Virus by PCR NEGATIVE NEGATIVE Final    Comment: (NOTE) Fact Sheet for Patients: BloggerCourse.com  Fact Sheet for Healthcare Providers: SeriousBroker.it  This test is not yet  approved or cleared by the [place]  FDA and has been authorized for detection and/or diagnosis of SARS-CoV-2 by FDA under an Emergency Use Authorization (EUA). This EUA will remain in effect (meaning this test can be used) for the duration of the COVID-19 declaration under Section 564(b)(1) of the Act, 21 U.S.C. section 360bbb-3(b)(1), unless the authorization is terminated or revoked.  Performed at [facility], [address] [place], [place] [address]          Radiology Studies: DG Chest 2 View Result Date: 08/24/2023 CLINICAL DATA:  Shortness of breath. EXAM: CHEST - 2 VIEW COMPARISON:  Jul 20, 2023. FINDINGS: The heart size and mediastinal contours are within normal limits. Both lungs are clear. The visualized skeletal structures are unremarkable. IMPRESSION: No active cardiopulmonary disease. Electronically Signed   By: Rosalene Colon M.D.   On: 08/24/2023 15:02           LOS: 0 days   Time spent= 35 mins    Maggie Schooner, MD [facility]  If 7PM-7AM, please contact night-coverage  08/25/2023, 8:51 AM

## 2023-08-26 ENCOUNTER — Other Ambulatory Visit (HOSPITAL_COMMUNITY): Payer: Self-pay

## 2023-08-26 DIAGNOSIS — R0602 Shortness of breath: Secondary | ICD-10-CM | POA: Diagnosis not present

## 2023-08-26 DIAGNOSIS — E43 Unspecified severe protein-calorie malnutrition: Secondary | ICD-10-CM | POA: Insufficient documentation

## 2023-08-26 LAB — CBC
HCT: 40.6 % (ref 39.0–52.0)
Hemoglobin: 13.4 g/dL (ref 13.0–17.0)
MCH: 33.1 pg (ref 26.0–34.0)
MCHC: 33 g/dL (ref 30.0–36.0)
MCV: 100.2 fL — ABNORMAL HIGH (ref 80.0–100.0)
Platelets: 277 10*3/uL (ref 150–400)
RBC: 4.05 MIL/uL — ABNORMAL LOW (ref 4.22–5.81)
RDW: 13.5 % (ref 11.5–15.5)
WBC: 9.7 10*3/uL (ref 4.0–10.5)
nRBC: 0 % (ref 0.0–0.2)

## 2023-08-26 LAB — BASIC METABOLIC PANEL WITH GFR
Anion gap: 5 (ref 5–15)
BUN: 21 mg/dL (ref 8–23)
CO2: 23 mmol/L (ref 22–32)
Calcium: 9 mg/dL (ref 8.9–10.3)
Chloride: 109 mmol/L (ref 98–111)
Creatinine, Ser: 1.06 mg/dL (ref 0.61–1.24)
GFR, Estimated: >60 mL/min (ref >60)
Glucose, Bld: 113 mg/dL — ABNORMAL HIGH (ref 70–99)
Potassium: 4 mmol/L (ref 3.5–5.1)
Sodium: 137 mmol/L (ref 135–145)

## 2023-08-26 LAB — PHOSPHORUS: Phosphorus: 2.8 mg/dL (ref 2.5–4.6)

## 2023-08-26 LAB — MAGNESIUM: Magnesium: 1.9 mg/dL (ref 1.7–2.4)

## 2023-08-26 MED ORDER — IPRATROPIUM-ALBUTEROL 0.5-2.5 (3) MG/3ML IN SOLN
3.0000 mL | Freq: Four times a day (QID) | RESPIRATORY_TRACT | 0 refills | Status: DC | PRN
  Filled 2023-08-26: qty 360, 30d supply, fill #0

## 2023-08-26 MED ORDER — DOXYCYCLINE HYCLATE 100 MG PO TABS
100.0000 mg | ORAL_TABLET | Freq: Two times a day (BID) | ORAL | 0 refills | Status: AC
Start: 2023-08-26 — End: 2023-08-29
  Filled 2023-08-26: qty 5, 3d supply, fill #0

## 2023-08-26 MED ORDER — PREDNISONE 20 MG PO TABS
40.0000 mg | ORAL_TABLET | Freq: Every day | ORAL | 0 refills | Status: AC
Start: 2023-08-26 — End: 2023-08-29
  Filled 2023-08-26: qty 6, 3d supply, fill #0

## 2023-08-26 MED ORDER — ADULT MULTIVITAMIN W/MINERALS CH: 1.0000 | ORAL_TABLET | Freq: Every day | ORAL | Status: DC

## 2023-08-26 MED ORDER — ALBUTEROL SULFATE HFA 108 (90 BASE) MCG/ACT IN AERS
2.0000 | INHALATION_SPRAY | Freq: Four times a day (QID) | RESPIRATORY_TRACT | 0 refills | Status: DC | PRN
Start: 2023-08-26 — End: 2023-08-29
  Filled 2023-08-26: qty 6.7, 25d supply, fill #0

## 2023-08-26 MED ORDER — BUDESONIDE 0.25 MG/2ML IN SUSP
0.2500 mg | Freq: Two times a day (BID) | RESPIRATORY_TRACT | 0 refills | Status: DC
  Filled 2023-08-26: qty 120, 30d supply, fill #0

## 2023-08-26 MED ORDER — FOLIC ACID 1 MG PO TABS
1.0000 mg | ORAL_TABLET | Freq: Every day | ORAL | 0 refills | Status: DC
  Filled 2023-08-26: qty 30, 30d supply, fill #0

## 2023-08-26 NOTE — Progress Notes (Signed)
 RNCM received DME orders for nebulizer machine.  Jermaine at [facility] contacted with order and confirmation received.  DME to be delivered to patient's room prior to discharge home today.

## 2023-08-26 NOTE — Discharge Summary (Signed)
 Physician Discharge Summary  Jason Wong ZOX:[id number] DOB: [birth year] DOA: 08/24/2023  PCP: Lawrance Presume, MD  Admit date: 08/24/2023 Discharge date: 08/26/2023  Admitted From: Home Disposition: Home  Recommendations for Outpatient Follow-up:  Follow up with PCP in 1-2 weeks Please obtain BMP/CBC in one week your next doctors visit.  Bronchodilators have been prescribed including DME machine Prednisone  and doxycycline  prescribed Counseled to quit using alcohol and illicit drugs/tobacco use   Discharge Condition: Stable CODE STATUS: Full Diet recommendation: Low Na  Brief/Interim Summary: Brief Narrative:  68 year old with history of chronic tobacco use/COPD, HTN, HLD, GERD, cocaine use, alcohol abuse comes to the hospital with epistaxis and shortness of breath which started about 2 days prior to the hospitalization.  In the ER flu, RSV/COVID were negative.  Toxicology was positive for cocaine.  BNP is normal.  In the ER received bronchodilators, steroids, magnesium .  Chest x-ray did not show any other acute pathology.  Upon admission also started on antibiotics, steroids, bronchodilators, I-S and flutter valve.  Echo shows preserved EF of 60%. Today his breathing is doing much better.  Will transition him to p.o. steroids, given p.o. antibiotics to finish course.  Medically stable for discharge   Assessment & Plan:  Principal Problem:   SOB (shortness of breath)   Acute respiratory distress COPD exacerbation - Transition to p.o. steroids, will give bronchodilators at home along with doxycycline .  Counseled to quit smoking cigarettes - Echo shows preserved EF 60%  Polysubstance abuse Alcohol abuse - Alcohol withdrawal protocol.  Counseled to quit using alcohol. -Folic acid , thiamine , multivitamin  Macrocytosis without anemia - Likely from alcohol use. -Folate, B12 are normal.   DVT prophylaxis: heparin  injection 5,000 Units Start: 08/24/23 2300    Code Status: Full  Code Family Communication:   DC today after PT eval  Subjective: Doing ok today no new complaints. Wants to go home.    Examination:  General exam: Appears calm and comfortable  Respiratory system: Clear to auscultation bilaterally Cardiovascular system: S1 & S2 heard, RRR. No JVD, murmurs, rubs, gallops or clicks. No pedal edema. Gastrointestinal system: Abdomen is nondistended, soft and nontender. No organomegaly or masses felt. Normal bowel sounds heard. Central nervous system: Alert and oriented. No focal neurological deficits. Extremities: Symmetric 5 x 5 power. Skin: No rashes, lesions or ulcers Psychiatry: Judgement and insight appear normal. Mood & affect appropriate.    Discharge Diagnoses:  Principal Problem:   SOB (shortness of breath) Active Problems:   COPD exacerbation (HCC)   Protein-calorie malnutrition, severe      Discharge Exam: Vitals:   08/26/23 0820 08/26/23 0854  BP: 127/81   Pulse: 72 72  Resp: 19 18  Temp: (!) 97.4 F (36.3 C)   SpO2: 98%    Vitals:   08/26/23 0316 08/26/23 0446 08/26/23 0820 08/26/23 0854  BP: 129/82 (!) 122/99 127/81   Pulse: 70 70 72 72  Resp:  17 19 18   Temp:  97.9 F (36.6 C) (!) 97.4 F (36.3 C)   TempSrc:      SpO2:  94% 98%   Weight:      Height:          Discharge Instructions   Allergies as of 08/26/2023       Reactions   Tobacco Shortness Of Breath, Other (See Comments)   CIGARETTE SMOKE TRIGGERS THE PATIENT'S RESPIRATORY ISSUES!!        Medication List     TAKE these medications    acetaminophen  500  MG tablet Commonly known as: TYLENOL  Take 500 mg by mouth as needed for mild pain (pain score 1-3) or headache.   albuterol  108 (90 Base) MCG/ACT inhaler Commonly known as: VENTOLIN  HFA Inhale 2 puffs into the lungs every 6 (six) hours as needed for wheezing or shortness of breath.   arformoterol  15 MCG/2ML Nebu Commonly known as: Brovana  Inhale 2 mLs (15 mcg total) into the lungs 2  (two) times daily.   atorvastatin  10 MG tablet Commonly known as: LIPITOR Take 1 tablet (10 mg total) by mouth daily.   budesonide  0.25 MG/2ML nebulizer solution Commonly known as: PULMICORT  Take 2 mLs (0.25 mg total) by nebulization 2 (two) times daily.   doxycycline  100 MG tablet Commonly known as: VIBRA -TABS Take 1 tablet (100 mg total) by mouth every 12 (twelve) hours for 5 doses.   famotidine  20 MG tablet Commonly known as: PEPCID  Take 1 tablet (20 mg total) by mouth 2 (two) times daily.   folic acid  1 MG tablet Commonly known as: FOLVITE  Take 1 tablet (1 mg total) by mouth daily.   ipratropium-albuterol  0.5-2.5 (3) MG/3ML Soln Commonly known as: DUONEB Take 3 mLs by nebulization every 6 (six) hours as needed.   multivitamin with minerals Tabs tablet Take 1 tablet by mouth daily.   pantoprazole  40 MG tablet Commonly known as: PROTONIX  Take 1 tablet (40 mg total) by mouth daily at 6 (six) AM.   predniSONE  20 MG tablet Commonly known as: DELTASONE  Take 2 tablets (40 mg total) by mouth daily with breakfast for 3 days.   sodium chloride  0.65 % Soln nasal spray Commonly known as: OCEAN Place 1 spray into both nostrils as needed for congestion.   thiamine  100 MG tablet Commonly known as: VITAMIN B1 Take 1 tablet (100 mg total) by mouth daily.   umeclidinium bromide  62.5 MCG/ACT Aepb Commonly known as: INCRUSE ELLIPTA  Inhale 1 puff into the lungs daily.               Durable Medical Equipment  (From admission, onward)           Start     Ordered   08/26/23 0847  For home use only DME Nebulizer/meds  Once       Question Answer Comment  Patient needs a nebulizer to treat with the following condition COPD (chronic obstructive pulmonary disease) (HCC)   Length of Need Lifetime      08/26/23 0846            Follow-up Information     Lawrance Presume, MD Follow up in 1 week(s).   Specialty: Internal Medicine Contact information: [address] [place] [address] [place] [address] [phone]                Allergies  Allergen Reactions   Tobacco Shortness Of Breath and Other (See Comments)    CIGARETTE SMOKE TRIGGERS THE PATIENT'S RESPIRATORY ISSUES!!    You were cared for by a hospitalist during your hospital stay. If you have any questions about your discharge medications or the care you received while you were in the hospital after you are discharged, you can call the unit and asked to speak with the hospitalist on call if the hospitalist that took care of you is not available. Once you are discharged, your primary care physician will handle any further medical issues. Please note that no refills for any discharge medications will be authorized once you are discharged, as it is imperative that you  return to your primary care physician (or establish a relationship with a primary care physician if you do not have one) for your aftercare needs so that they can reassess your need for medications and monitor your lab values.  You were cared for by a hospitalist during your hospital stay. If you have any questions about your discharge medications or the care you received while you were in the hospital after you are discharged, you can call the unit and asked to speak with the hospitalist on call if the hospitalist that took care of you is not available. Once you are discharged, your primary care physician will handle any further medical issues. Please note that NO REFILLS for any discharge medications will be authorized once you are discharged, as it is imperative that you return to your primary care physician (or establish a relationship with a primary care physician if you do not have one) for your aftercare needs so that they can reassess your need for medications and monitor your lab values.  Please request your Prim.MD to go over all Hospital Tests and Procedure/Radiological results at the follow up, please get all Hospital  records sent to your Prim MD by signing hospital release before you go home.  Get CBC, CMP, 2 view Chest X ray checked  by Primary MD during your next visit or SNF MD in 5-7 days ( we routinely change or add medications that can affect your baseline labs and fluid status, therefore we recommend that you get the mentioned basic workup next visit with your PCP, your PCP may decide not to get them or add new tests based on their clinical decision)  On your next visit with your primary care physician please Get Medicines reviewed and adjusted.  If you experience worsening of your admission symptoms, develop shortness of breath, life threatening emergency, suicidal or homicidal thoughts you must seek medical attention immediately by calling 911 or calling your MD immediately  if symptoms less severe.  You Must read complete instructions/literature along with all the possible adverse reactions/side effects for all the Medicines you take and that have been prescribed to you. Take any new Medicines after you have completely understood and accpet all the possible adverse reactions/side effects.   Do not drive, operate heavy machinery, perform activities at heights, swimming or participation in water activities or provide baby sitting services if your were admitted for syncope or siezures until you have seen by Primary MD or a Neurologist and advised to do so again.  Do not drive when taking Pain medications.   Procedures/Studies: ECHOCARDIOGRAM COMPLETE Result Date: 08/25/2023    ECHOCARDIOGRAM REPORT   Patient Name:   KIREE DEJARNETTE Date of Exam: 08/25/2023 Medical Rec #:  [id number]    Height:       67.0 in Accession #:    [id number]   Weight:       90.0 lb Date of Birth:  01-28-1956    BSA:          1.440 m Patient Age:    68 years     BP:           126/91 mmHg Patient Gender: M            HR:           66 bpm. Exam Location:  Inpatient Procedure: 2D Echo, Cardiac Doppler and Color Doppler (Both Spectral and  Color            Flow Doppler were utilized  during procedure). Indications:    CHF-Acute Systolic I50.21  History:        Patient has no prior history of Echocardiogram examinations.                 COPD, Signs/Symptoms:Shortness of Breath; Risk                 Factors:Hypertension, Dyslipidemia and Current Smoker.  Sonographer:    Terrilee Few RCS Referring Phys: [phone] EKTA V PATEL IMPRESSIONS  1. Left ventricular ejection fraction, by estimation, is 60 to 65%. The left ventricle has normal function. The left ventricle has no regional wall motion abnormalities. Left ventricular diastolic parameters were normal.  2. Right ventricular systolic function is normal. The right ventricular size is normal.  3. Some shadowing artifact in LA likely from calcium  on mitral leaflets.  4. The mitral valve is abnormal. No evidence of mitral valve regurgitation. No evidence of mitral stenosis.  5. The aortic valve was not well visualized. There is mild calcification of the aortic valve. There is mild thickening of the aortic valve. Aortic valve regurgitation is not visualized. Aortic valve sclerosis is present, with no evidence of aortic valve  stenosis.  6. The inferior vena cava is normal in size with greater than 50% respiratory variability, suggesting right atrial pressure of 3 mmHg. FINDINGS  Left Ventricle: Left ventricular ejection fraction, by estimation, is 60 to 65%. The left ventricle has normal function. The left ventricle has no regional wall motion abnormalities. Strain was performed and the global longitudinal strain is indeterminate. The left ventricular internal cavity size was normal in size. There is no left ventricular hypertrophy. Left ventricular diastolic parameters were normal. Right Ventricle: The right ventricular size is normal. No increase in right ventricular wall thickness. Right ventricular systolic function is normal. Left Atrium: Some shadowing artifact in LA likely from calcium  on mitral  leaflets. Left atrial size was normal in size. Right Atrium: Right atrial size was normal in size. Pericardium: There is no evidence of pericardial effusion. Mitral Valve: The mitral valve is abnormal. There is mild thickening of the mitral valve leaflet(s). There is mild calcification of the mitral valve leaflet(s). No evidence of mitral valve regurgitation. No evidence of mitral valve stenosis. Tricuspid Valve: The tricuspid valve is normal in structure. Tricuspid valve regurgitation is trivial. No evidence of tricuspid stenosis. Aortic Valve: The aortic valve was not well visualized. There is mild calcification of the aortic valve. There is mild thickening of the aortic valve. Aortic valve regurgitation is not visualized. Aortic valve sclerosis is present, with no evidence of aortic valve stenosis. Aortic valve peak gradient measures 5.7 mmHg. Pulmonic Valve: The pulmonic valve was normal in structure. Pulmonic valve regurgitation is not visualized. No evidence of pulmonic stenosis. Aorta: The aortic root is normal in size and structure. Venous: The inferior vena cava is normal in size with greater than 50% respiratory variability, suggesting right atrial pressure of 3 mmHg. IAS/Shunts: No atrial level shunt detected by color flow Doppler. Additional Comments: 3D was performed not requiring image post processing on an independent workstation and was indeterminate.  LEFT VENTRICLE PLAX 2D LVIDd:         3.80 cm   Diastology LVIDs:         2.50 cm   LV e' medial:    10.30 cm/s LV PW:         0.90 cm   LV E/e' medial:  8.9 LV IVS:  0.80 cm   LV e' lateral:   12.20 cm/s LVOT diam:     1.90 cm   LV E/e' lateral: 7.5 LV SV:         59 LV SV Index:   41 LVOT Area:     2.84 cm  IVC IVC diam: 1.20 cm LEFT ATRIUM             Index        RIGHT ATRIUM           Index LA diam:        2.30 cm 1.60 cm/m   RA Area:     11.20 cm LA Vol (A2C):   32.2 ml 22.36 ml/m  RA Volume:   24.00 ml  16.66 ml/m LA Vol (A4C):   32.0  ml 22.22 ml/m LA Biplane Vol: 33.1 ml 22.98 ml/m  AORTIC VALVE AV Area (Vmax): 2.43 cm AV Vmax:        119.00 cm/s AV Peak Grad:   5.7 mmHg LVOT Vmax:      102.00 cm/s LVOT Vmean:     66.000 cm/s LVOT VTI:       0.209 m  AORTA Ao Root diam: 3.70 cm MITRAL VALVE MV Area (PHT): 3.65 cm    SHUNTS MV Decel Time: 208 msec    Systemic VTI:  0.21 m MV E velocity: 91.20 cm/s  Systemic Diam: 1.90 cm MV A velocity: 74.40 cm/s MV E/A ratio:  1.23 Janelle Mediate MD Electronically signed by Janelle Mediate MD Signature Date/Time: 08/25/2023/1:14:30 PM    Final    DG Chest 2 View Result Date: 08/24/2023 CLINICAL DATA:  Shortness of breath. EXAM: CHEST - 2 VIEW COMPARISON:  Jul 20, 2023. FINDINGS: The heart size and mediastinal contours are within normal limits. Both lungs are clear. The visualized skeletal structures are unremarkable. IMPRESSION: No active cardiopulmonary disease. Electronically Signed   By: Rosalene Colon M.D.   On: 08/24/2023 15:02     The results of significant diagnostics from this hospitalization (including imaging, microbiology, ancillary and laboratory) are listed below for reference.     Microbiology: Recent Results (from the past 240 hours)  Resp panel by RT-PCR (RSV, Flu A&B, Covid) Anterior Nasal Swab     Status: None   Collection Time: 08/24/23  2:15 PM   Specimen: Anterior Nasal Swab  Result Value Ref Range Status   SARS Coronavirus 2 by RT PCR NEGATIVE NEGATIVE Final   Influenza A by PCR NEGATIVE NEGATIVE Final   Influenza B by PCR NEGATIVE NEGATIVE Final    Comment: (NOTE) The Xpert Xpress SARS-CoV-2/FLU/RSV plus assay is intended as an aid in the diagnosis of influenza from Nasopharyngeal swab specimens and should not be used as a sole basis for treatment. Nasal washings and aspirates are unacceptable for Xpert Xpress SARS-CoV-2/FLU/RSV testing.  Fact Sheet for Patients: BloggerCourse.com  Fact Sheet for Healthcare  Providers: SeriousBroker.it  This test is not yet approved or cleared by the [place]  FDA and has been authorized for detection and/or diagnosis of SARS-CoV-2 by FDA under an Emergency Use Authorization (EUA). This EUA will remain in effect (meaning this test can be used) for the duration of the COVID-19 declaration under Section 564(b)(1) of the Act, 21 U.S.C. section 360bbb-3(b)(1), unless the authorization is terminated or revoked.     Resp Syncytial Virus by PCR NEGATIVE NEGATIVE Final    Comment: (NOTE) Fact Sheet for Patients: BloggerCourse.com  Fact Sheet for Healthcare Providers: SeriousBroker.it  This  test is not yet approved or cleared by the [place]  FDA and has been authorized for detection and/or diagnosis of SARS-CoV-2 by FDA under an Emergency Use Authorization (EUA). This EUA will remain in effect (meaning this test can be used) for the duration of the COVID-19 declaration under Section 564(b)(1) of the Act, 21 U.S.C. section 360bbb-3(b)(1), unless the authorization is terminated or revoked.  Performed at [facility], [address] [place], [place] [address]      Labs: BNP (last 3 results) Recent Labs    02/17/23 1600 05/14/23 0205 08/25/23 0041  BNP 69.4 169.0* 46.1   Basic Metabolic Panel: Recent Labs  Lab 08/24/23 1427 08/25/23 0041 08/25/23 0315 08/26/23 0340  NA 142  --  139 137  K 3.9  --  3.6 4.0  CL 107  --  108 109  CO2 23  --  22 23  GLUCOSE 103*  --  179* 113*  BUN 11  --  12 21  CREATININE 0.96 0.95 0.97 1.06  CALCIUM  9.7  --  9.3 9.0  MG  --  2.4  --  1.9  PHOS  --   --   --  2.8   Liver Function Tests: Recent Labs  Lab 08/24/23 1427 08/25/23 0315  AST 23 21  ALT 12 11  ALKPHOS 88 80  BILITOT 1.3* 0.6  PROT 7.4 6.8  ALBUMIN 4.3 3.7   No results for input(s): "LIPASE", "AMYLASE" in the last 168 hours. No results for input(s):  "AMMONIA" in the last 168 hours. CBC: Recent Labs  Lab 08/24/23 1427 08/25/23 0041 08/25/23 0315 08/26/23 0340  WBC 5.6 4.3 3.1* 9.7  NEUTROABS 2.1  --   --   --   HGB 14.8 13.8 13.6 13.4  HCT 44.1 41.0 41.8 40.6  MCV 102.3* 98.1 102.0* 100.2*  PLT 286 279 268 277   Cardiac Enzymes: No results for input(s): "CKTOTAL", "CKMB", "CKMBINDEX", "TROPONINI" in the last 168 hours. BNP: Invalid input(s): "POCBNP" CBG: No results for input(s): "GLUCAP" in the last 168 hours. D-Dimer No results for input(s): "DDIMER" in the last 72 hours. Hgb A1c No results for input(s): "HGBA1C" in the last 72 hours. Lipid Profile No results for input(s): "CHOL", "HDL", "LDLCALC", "TRIG", "CHOLHDL", "LDLDIRECT" in the last 72 hours. Thyroid  function studies No results for input(s): "TSH", "T4TOTAL", "T3FREE", "THYROIDAB" in the last 72 hours.  Invalid input(s): "FREET3" Anemia work up Recent Labs    08/25/23 0041  VITAMINB12 290  FOLATE 23.5   Urinalysis    Component Value Date/Time   COLORURINE YELLOW 05/13/2023 2334   APPEARANCEUR CLEAR 05/13/2023 2334   LABSPEC 1.020 05/13/2023 2334   PHURINE 5.0 05/13/2023 2334   GLUCOSEU 50 (A) 05/13/2023 2334   HGBUR SMALL (A) 05/13/2023 2334   BILIRUBINUR NEGATIVE 05/13/2023 2334   KETONESUR NEGATIVE 05/13/2023 2334   PROTEINUR NEGATIVE 05/13/2023 2334   NITRITE NEGATIVE 05/13/2023 2334   LEUKOCYTESUR NEGATIVE 05/13/2023 2334   Sepsis Labs Recent Labs  Lab 08/24/23 1427 08/25/23 0041 08/25/23 0315 08/26/23 0340  WBC 5.6 4.3 3.1* 9.7   Microbiology Recent Results (from the past 240 hours)  Resp panel by RT-PCR (RSV, Flu A&B, Covid) Anterior Nasal Swab     Status: None   Collection Time: 08/24/23  2:15 PM   Specimen: Anterior Nasal Swab  Result Value Ref Range Status   SARS Coronavirus 2 by RT PCR NEGATIVE NEGATIVE Final   Influenza A by PCR NEGATIVE NEGATIVE Final  Influenza B by PCR NEGATIVE NEGATIVE Final    Comment: (NOTE) The  Xpert Xpress SARS-CoV-2/FLU/RSV plus assay is intended as an aid in the diagnosis of influenza from Nasopharyngeal swab specimens and should not be used as a sole basis for treatment. Nasal washings and aspirates are unacceptable for Xpert Xpress SARS-CoV-2/FLU/RSV testing.  Fact Sheet for Patients: BloggerCourse.com  Fact Sheet for Healthcare Providers: SeriousBroker.it  This test is not yet approved or cleared by the [place]  FDA and has been authorized for detection and/or diagnosis of SARS-CoV-2 by FDA under an Emergency Use Authorization (EUA). This EUA will remain in effect (meaning this test can be used) for the duration of the COVID-19 declaration under Section 564(b)(1) of the Act, 21 U.S.C. section 360bbb-3(b)(1), unless the authorization is terminated or revoked.     Resp Syncytial Virus by PCR NEGATIVE NEGATIVE Final    Comment: (NOTE) Fact Sheet for Patients: BloggerCourse.com  Fact Sheet for Healthcare Providers: SeriousBroker.it  This test is not yet approved or cleared by the [place]  FDA and has been authorized for detection and/or diagnosis of SARS-CoV-2 by FDA under an Emergency Use Authorization (EUA). This EUA will remain in effect (meaning this test can be used) for the duration of the COVID-19 declaration under Section 564(b)(1) of the Act, 21 U.S.C. section 360bbb-3(b)(1), unless the authorization is terminated or revoked.  Performed at [facility], [address] [place] [address] [place], [place] [address]      Time coordinating discharge:  I have spent 35 minutes face to face with the patient and on the ward discussing the patients care, assessment, plan and disposition with other care givers. >50% of the time was devoted counseling the patient about the risks and benefits of treatment/Discharge disposition and coordinating care.    SIGNED:   Maggie Schooner, MD  [facility] 08/26/2023, 10:07 AM   If 7PM-7AM, please contact night-coverage

## 2023-08-26 NOTE — Plan of Care (Signed)

## 2023-08-28 ENCOUNTER — Ambulatory Visit: Attending: Internal Medicine | Admitting: Internal Medicine

## 2023-08-28 ENCOUNTER — Other Ambulatory Visit: Payer: Self-pay

## 2023-08-28 ENCOUNTER — Encounter: Payer: Self-pay | Admitting: Internal Medicine

## 2023-08-28 ENCOUNTER — Telehealth: Payer: Self-pay | Admitting: *Deleted

## 2023-08-28 VITALS — BP 120/76 | HR 85 | Wt 118.6 lb

## 2023-08-28 DIAGNOSIS — Z09 Encounter for follow-up examination after completed treatment for conditions other than malignant neoplasm: Secondary | ICD-10-CM | POA: Diagnosis not present

## 2023-08-28 DIAGNOSIS — F17210 Nicotine dependence, cigarettes, uncomplicated: Secondary | ICD-10-CM

## 2023-08-28 DIAGNOSIS — I10 Essential (primary) hypertension: Secondary | ICD-10-CM

## 2023-08-28 DIAGNOSIS — F17200 Nicotine dependence, unspecified, uncomplicated: Secondary | ICD-10-CM

## 2023-08-28 DIAGNOSIS — F1410 Cocaine abuse, uncomplicated: Secondary | ICD-10-CM | POA: Diagnosis not present

## 2023-08-28 DIAGNOSIS — D7589 Other specified diseases of blood and blood-forming organs: Secondary | ICD-10-CM | POA: Diagnosis not present

## 2023-08-28 DIAGNOSIS — J432 Centrilobular emphysema: Secondary | ICD-10-CM | POA: Diagnosis not present

## 2023-08-28 MED ORDER — BUDESONIDE 0.25 MG/2ML IN SUSP
0.2500 mg | Freq: Two times a day (BID) | RESPIRATORY_TRACT | 1 refills | Status: DC
Start: 2023-08-28 — End: 2023-10-05
  Filled 2023-08-28: qty 120, 30d supply, fill #0

## 2023-08-28 MED ORDER — ARFORMOTEROL TARTRATE 15 MCG/2ML IN NEBU
15.0000 ug | INHALATION_SOLUTION | Freq: Two times a day (BID) | RESPIRATORY_TRACT | 2 refills | Status: DC
  Filled 2023-08-28 – 2023-08-29 (×2): qty 120, 30d supply, fill #0

## 2023-08-28 MED ORDER — UMECLIDINIUM BROMIDE 62.5 MCG/ACT IN AEPB
1.0000 | INHALATION_SPRAY | Freq: Every day | RESPIRATORY_TRACT | 6 refills | Status: DC
  Filled 2023-08-28 – 2023-08-30 (×4): qty 30, 30d supply, fill #0

## 2023-08-28 NOTE — Progress Notes (Signed)
 Patient ID: Jason Wong, male    DOB: 11-27-1955  MRN: [id number]  CC: Hospitalization Follow-up   Subjective: Jason Wong is a 68 y.o. male who presents for chronic ds management. His concerns today include:  Pt with hx of HTN, tob dep, seasonal allergies, asthma, centrilobular emphysema, insomnia, coronary atherosclerosis, pre-DM, polysub abuse (ETOH, cocaine, drug over dose accidentally 08/2020)  and hep C treated.    Discussed the use of AI scribe software for clinical note transcription with the patient, who gave verbal consent to proceed.  History of Present Illness Jason Wong is a 68 year old male with COPD who presents for follow-up after recent hospitalization for a COPD exacerbation.  He was hospitalized for a COPD exacerbation with shortness of breath. Tests for flu, RSV, and COVID were negative, and a chest x-ray showed no pneumonia.  Urine drug screen was positive for cocaine.  He received nebulizer treatments, antibiotics, and steroids. An echocardiogram showed normal heart function with an ejection fraction of 60-65%. An elevated MCV was noted during hospitalization, with normal vitamin B12 and folate levels.  He was discharged with prescriptions for an albuterol  inhaler and an Incruse inhaler but has only picked up the albuterol  inhaler. He uses the albuterol  inhaler several times daily, especially when fatigued after walking, and uses a nebulizer solution twice daily.  He should have nebulizer treatments for DuoNebs, Brovana  and Pulmicort  he has two days left of prednisone . He describes his breathing as 'hit or miss'.  Tobacco: He smokes less than one cigarette a day, not daily, which causes fatigue and coughing.  He has a history of cocaine use but states he is 'through with that'.  He consumes alcohol occasionally, about one or two small beers, but not daily.   He wants to know whether he needs to be back on blood pressure medication the amlodipine .  We held off on  restarting it on his visit with me 1 month ago his blood pressure at that time was normal.    Patient Active Problem List   Diagnosis Date Noted   Protein-calorie malnutrition, severe 08/26/2023   SOB (shortness of breath) 08/24/2023   Hematuria 05/17/2023   Acute on chronic respiratory failure with hypoxia (HCC) 05/16/2023   Cystitis 05/14/2023   Influenza A 05/13/2023   Protein-calorie malnutrition (HCC) 05/13/2023   Urinary retention due to benign prostatic hyperplasia 05/13/2023   Acute respiratory failure with hypoxia (HCC) 02/17/2023   HTN (hypertension) 02/17/2023   HLD (hyperlipidemia) 02/17/2023   Tobacco use 02/17/2023   Acute exacerbation of chronic obstructive pulmonary disease (COPD) (HCC) 02/17/2023   Multifocal pneumonia 01/30/2023   COPD with acute exacerbation (HCC) 01/30/2023   COPD with acute exacerbation (HCC) 06/20/2022   Multifocal pneumonia 06/20/2022   Hypokalemia 06/20/2022   Acute respiratory failure with hypoxia (HCC) 06/20/2022   Polysubstance abuse (HCC) 05/09/2022   On home oxygen  therapy 08/31/2021   Malnutrition of moderate degree 06/16/2021   Acute exacerbation of chronic obstructive pulmonary disease (COPD) (HCC) 06/14/2021   ETOH abuse 05/01/2021   Acute hypoxemic respiratory failure (HCC) 04/30/2021   HLD (hyperlipidemia) 04/30/2021   COPD (chronic obstructive pulmonary disease) with emphysema (HCC) 02/19/2021   COPD exacerbation (HCC) 02/18/2021   Memory deficit 04/28/2020   Hepatitis C virus infection cured after antiviral drug therapy 04/27/2018   Enuresis 04/27/2018   Centrilobular emphysema (HCC) 04/27/2018   Seasonal allergic rhinitis 04/27/2018   Tobacco dependence 08/24/2017   Weight loss, unintentional 08/24/2017   Insomnia 06/27/2017  Prediabetes 01/24/2017   Asthma 06/10/2016   Essential hypertension 06/10/2016   Current every day smoker 06/10/2016     Current Outpatient Medications on File Prior to Visit  Medication Sig  Dispense Refill   albuterol  (VENTOLIN  HFA) 108 (90 Base) MCG/ACT inhaler Inhale 2 puffs into the lungs every 6 (six) hours as needed for wheezing or shortness of breath. 6.7 g 0   atorvastatin  (LIPITOR) 10 MG tablet Take 1 tablet (10 mg total) by mouth daily. 90 tablet 1   doxycycline  (VIBRA -TABS) 100 MG tablet Take 1 tablet (100 mg total) by mouth every 12 (twelve) hours for 5 doses. 5 tablet 0   folic acid  (FOLVITE ) 1 MG tablet Take 1 tablet (1 mg total) by mouth daily. 30 tablet 0   ipratropium-albuterol  (DUONEB) 0.5-2.5 (3) MG/3ML SOLN Take 3 mLs by nebulization every 6 (six) hours as needed. 360 mL 0   pantoprazole  (PROTONIX ) 40 MG tablet Take 1 tablet (40 mg total) by mouth daily at 6 (six) AM. 60 tablet 1   predniSONE  (DELTASONE ) 20 MG tablet Take 2 tablets (40 mg total) by mouth daily with breakfast for 3 days. 6 tablet 0   sodium chloride  (OCEAN) 0.65 % SOLN nasal spray Place 1 spray into both nostrils as needed for congestion. 30 mL 1   thiamine  (VITAMIN B1) 100 MG tablet Take 1 tablet (100 mg total) by mouth daily. 30 tablet 0   Multiple Vitamin (MULTIVITAMIN WITH MINERALS) TABS tablet Take 1 tablet by mouth daily. (Patient not taking: Reported on 08/28/2023)     No current facility-administered medications on file prior to visit.    Allergies  Allergen Reactions   Tobacco Shortness Of Breath and Other (See Comments)    CIGARETTE SMOKE TRIGGERS THE PATIENT'S RESPIRATORY ISSUES!!    Social History   Socioeconomic History   Marital status: Single    Spouse name: Not on file   Number of children: Not on file   Years of education: Not on file   Highest education level: Not on file  Occupational History   Not on file  Tobacco Use   Smoking status: Never   Smokeless tobacco: Never  Vaping Use   Vaping status: Never Used  Substance and Sexual Activity   Alcohol use: Yes    Alcohol/week: 2.0 standard drinks of alcohol    Types: 2 Cans of beer per week   Drug use: Not  Currently    Types: "Crack" cocaine   Sexual activity: Yes    Partners: Female  Other Topics Concern   Not on file  Social History Narrative   ** Merged History Encounter **       Pt states he is from home with his nephew. He uses the bus to get to appointments and the grocery store and will be using the bus to get home. He has a nebulizer at home and two metal oxygen  tanks that he uses as needed. He states he doesn't know who fil   ls them, but says they do know who to call when they get empty.   Social Drivers of Corporate investment banker Strain: Low Risk  (07/24/2023)   Overall Financial Resource Strain (CARDIA)    Difficulty of Paying Living Expenses: Not hard at all  Food Insecurity: No Food Insecurity (08/25/2023)   Hunger Vital Sign    Worried About Running Out of Food in the Last Year: Never true    Ran Out of Food in the Last Year: Never  true  Transportation Needs: No Transportation Needs (08/25/2023)   PRAPARE - Administrator, Civil Service (Medical): No    Lack of Transportation (Non-Medical): No  Physical Activity: Inactive (07/24/2023)   Exercise Vital Sign    Days of Exercise per Week: 0 days    Minutes of Exercise per Session: 0 min  Stress: No Stress Concern Present (07/24/2023)   [employer] of Occupational Health - Occupational Stress Questionnaire    Feeling of Stress : Only a little  Social Connections: Unknown (08/25/2023)   Social Connection and Isolation Panel [NHANES]    Frequency of Communication with Friends and Family: Three times a week    Frequency of Social Gatherings with Friends and Family: More than three times a week    Attends Religious Services: Never    Database administrator or Organizations: No    Attends Banker Meetings: Never    Marital Status: Patient declined  Intimate Partner Violence: Not At Risk (08/25/2023)   Humiliation, Afraid, Rape, and Kick questionnaire    Fear of Current or Ex-Partner: No     Emotionally Abused: No    Physically Abused: No    Sexually Abused: No    Family History  Problem Relation Age of Onset   Hypertension Mother    Breast cancer Sister    Hypertension Sister    Colon cancer Brother    Colon polyps Neg Hx    Esophageal cancer Neg Hx    Rectal cancer Neg Hx    Stomach cancer Neg Hx     Past Surgical History:  Procedure Laterality Date   INCISE AND DRAIN ABCESS     dog bite    ROS: Review of Systems Negative except as stated above  PHYSICAL EXAM: BP 120/76   Pulse 85   Wt 118 lb 9.6 oz (53.8 kg)   SpO2 98%   BMI 18.58 kg/m   Physical Exam   General appearance - alert, well appearing, older AAM and in no distress Mental status - normal mood, behavior, speech, dress, motor activity, and thought processes Chest - sounds mildly decrease, few scattered wheezes Heart - normal rate, regular rhythm, normal S1, S2, no murmurs, rubs, clicks or gallops Extremities - no LE edema     Latest Ref Rng & Units 08/26/2023    3:40 AM 08/25/2023    3:15 AM 08/25/2023   12:41 AM  CMP  Glucose 70 - 99 mg/dL 161  096    BUN 8 - 23 mg/dL 21  12    Creatinine 0.45 - 1.24 mg/dL 4.09  8.11  9.14   Sodium 135 - 145 mmol/L 137  139    Potassium 3.5 - 5.1 mmol/L 4.0  3.6    Chloride 98 - 111 mmol/L 109  108    CO2 22 - 32 mmol/L 23  22    Calcium  8.9 - 10.3 mg/dL 9.0  9.3    Total Protein 6.5 - 8.1 g/dL  6.8    Total Bilirubin 0.0 - 1.2 mg/dL  0.6    Alkaline Phos 38 - 126 U/L  80    AST 15 - 41 U/L  21    ALT 0 - 44 U/L  11     Lipid Panel     Component Value Date/Time   CHOL 171 09/06/2019 1053   TRIG 57 09/06/2019 1053   HDL 70 09/06/2019 1053   CHOLHDL 2.4 09/06/2019 1053   LDLCALC 90 09/06/2019 1053  CBC    Component Value Date/Time   WBC 9.7 08/26/2023 0340   RBC 4.05 (L) 08/26/2023 0340   HGB 13.4 08/26/2023 0340   HGB 17.2 09/06/2019 1053   HCT 40.6 08/26/2023 0340   HCT 49.9 09/06/2019 1053   PLT 277 08/26/2023 0340   PLT 274  09/06/2019 1053   MCV 100.2 (H) 08/26/2023 0340   MCV 98 (H) 09/06/2019 1053   MCH 33.1 08/26/2023 0340   MCHC 33.0 08/26/2023 0340   RDW 13.5 08/26/2023 0340   RDW 11.5 (L) 09/06/2019 1053   LYMPHSABS 2.1 08/24/2023 1427   LYMPHSABS 2.5 06/13/2016 1110   MONOABS 0.5 08/24/2023 1427   EOSABS 0.7 (H) 08/24/2023 1427   EOSABS 0.1 06/13/2016 1110   BASOSABS 0.1 08/24/2023 1427   BASOSABS 0.0 06/13/2016 1110    ASSESSMENT AND PLAN: 1. Hospital discharge follow-up (Primary)  2. Centrilobular emphysema (HCC) Improved since hospitalization. Discussed the importance of getting rid of the cigarettes completely. Encouraged him to pick up prescription for Incruse inhaler and use it daily so that he is not having to use the albuterol  inhaler as often.  Refills sent on Incruse inhaler, Brovana  and Pulmicort  nebulizer treatments  3. Cocaine use disorder, mild, abuse (HCC) Strongly encouraged him to quit completely.  He declines and does not feel that he needs to get into a support group.  States that he has given it up already.  4. Tobacco dependence See #2 above  5. Essential hypertension Blood pressure is at goal without medication.  We will continue to monitor off amlodipine .  6. Macrocytic Likely due to EtOH use.  Encouraged him to try not to drink at all given prior history of abuse.   Patient was given the opportunity to ask questions.  Patient verbalized understanding of the plan and was able to repeat key elements of the plan.   This documentation was completed using Paediatric nurse.  Any transcriptional errors are unintentional.  No orders of the defined types were placed in this encounter.    Requested Prescriptions   Signed Prescriptions Disp Refills   umeclidinium bromide  (INCRUSE ELLIPTA ) 62.5 MCG/ACT AEPB 30 each 6    Sig: Inhale 1 puff into the lungs daily.   arformoterol  (BROVANA ) 15 MCG/2ML NEBU 120 mL 2    Sig: Inhale 2 mLs (15 mcg total)  into the lungs 2 (two) times daily.   budesonide  (PULMICORT ) 0.25 MG/2ML nebulizer solution 120 mL 1    Sig: Take 2 mLs (0.25 mg total) by nebulization 2 (two) times daily.    Return in about 6 weeks (around 10/09/2023) for for Medicare Wellness Visit after 10/08/2023.  Concetta Dee, MD, FACP

## 2023-08-28 NOTE — Patient Instructions (Signed)
 VISIT SUMMARY:  You had a follow-up appointment today after your recent hospitalization for a COPD exacerbation. During your hospital stay, you were treated with nebulizer treatments, antibiotics, and steroids, and your heart function was found to be normal. You were discharged with prescriptions for an albuterol  inhaler and an Incruse inhaler, but you have only picked up the albuterol  inhaler. You continue to experience intermittent shortness of breath and use your nebulizer solution twice daily. We also discussed your smoking and past cocaine use, as well as your blood pressure management.  YOUR PLAN:  -CHRONIC OBSTRUCTIVE PULMONARY DISEASE (COPD) EXACERBATION: COPD exacerbation means a worsening of your chronic lung disease, which causes increased shortness of breath and other symptoms. You should ensure daily use of your Incruse inhaler, continue using your albuterol  inhaler as needed, and complete your prednisone  course. We also recommend continuing your nebulizer treatments twice daily and encourage you to quit smoking and avoid cocaine use as these can worsen your symptoms.  -SUBSTANCE USE (COCAINE AND ALCOHOL): Substance use refers to the consumption of drugs or alcohol that can negatively impact your health. You have reported stopping cocaine use and reducing alcohol intake. We encourage you to continue abstaining from cocaine and advise against alcohol consumption to support your overall health.  -HYPERTENSION: Hypertension is high blood pressure, which can lead to serious health problems if not managed properly. Your blood pressure is currently controlled without medication, but we will monitor it and reassess the need for amlodipine  at your next visit.  -GENERAL HEALTH MAINTENANCE: General health maintenance involves routine check-ups and preventive care to keep you healthy. You are due for a Medicare wellness visit after July 20th, so please schedule this  appointment.  INSTRUCTIONS:  Please ensure you pick up and use your Incruse inhaler daily. Continue using your albuterol  inhaler as needed and complete your prednisone  course. Keep up with your nebulizer treatments twice daily. Monitor your blood pressure and we will reassess the need for amlodipine  at your next visit. Schedule your Medicare wellness visit for after July 20th. We strongly encourage you to quit smoking and avoid cocaine use. Abstain from alcohol to support your overall health.

## 2023-08-28 NOTE — Transitions of Care (Post Inpatient/ED Visit) (Signed)
   08/28/2023  Name: Jamicah Anstead MRN: [id number] DOB: 18-Dec-1955  Today's TOC FU Call Status: Today's TOC FU Call Status:: Unsuccessful Call (1st Attempt) Unsuccessful Call (1st Attempt) Date: 08/28/23  Attempted to reach the patient regarding the most recent Inpatient/ED visit.  Follow Up Plan: Additional outreach attempts will be made to reach the patient to complete the Transitions of Care (Post Inpatient/ED visit) call.   Arna Better RN, BSN [place]  [facility] RN Care Manager [phone]

## 2023-08-29 ENCOUNTER — Other Ambulatory Visit: Payer: Self-pay | Admitting: Internal Medicine

## 2023-08-29 ENCOUNTER — Telehealth: Payer: Self-pay

## 2023-08-29 ENCOUNTER — Other Ambulatory Visit: Payer: Self-pay

## 2023-08-29 DIAGNOSIS — J432 Centrilobular emphysema: Secondary | ICD-10-CM

## 2023-08-29 MED ORDER — ALBUTEROL SULFATE HFA 108 (90 BASE) MCG/ACT IN AERS
2.0000 | INHALATION_SPRAY | Freq: Four times a day (QID) | RESPIRATORY_TRACT | 5 refills | Status: DC | PRN
  Filled 2023-08-29 – 2023-08-30 (×2): qty 6.7, 25d supply, fill #0

## 2023-08-29 NOTE — Transitions of Care (Post Inpatient/ED Visit) (Signed)
   08/29/2023  Name: Jason Wong MRN: [id number] DOB: 11/13/1955  Today's TOC FU Call Status: Today's TOC FU Call Status:: Unsuccessful Call (2nd Attempt) Unsuccessful Call (2nd Attempt) Date: 08/29/23  Attempted to reach the patient regarding the most recent Inpatient/ED visit.  Follow Up Plan: Additional outreach attempts will be made to reach the patient to complete the Transitions of Care (Post Inpatient/ED visit) call.   Tonia Frankel RN, CCM [place]  [facility] RN Care Manager [phone]

## 2023-08-30 ENCOUNTER — Other Ambulatory Visit: Payer: Self-pay

## 2023-08-30 ENCOUNTER — Telehealth: Payer: Self-pay

## 2023-08-30 NOTE — Transitions of Care (Post Inpatient/ED Visit) (Signed)
   08/30/2023  Name: Jason Wong MRN: [id number] DOB: 03-23-1955  Today's TOC FU Call Status: Today's TOC FU Call Status:: Unsuccessful Call (3rd Attempt) Unsuccessful Call (3rd Attempt) Date: 08/30/23  Attempted to reach the patient regarding the most recent Inpatient/ED visit.  Follow Up Plan: No further outreach attempts will be made at this time. We have been unable to contact the patient.  Tonia Frankel RN, CCM [place]  [facility] RN Care Manager [phone]

## 2023-09-01 ENCOUNTER — Emergency Department (HOSPITAL_COMMUNITY)

## 2023-09-01 ENCOUNTER — Other Ambulatory Visit: Payer: Self-pay

## 2023-09-01 ENCOUNTER — Emergency Department (HOSPITAL_COMMUNITY)
Admission: EM | Admit: 2023-09-01 | Discharge: 2023-09-02 | Disposition: A | Discharge status: Home / Self Care | Attending: Emergency Medicine | Admitting: Emergency Medicine

## 2023-09-01 ENCOUNTER — Encounter (HOSPITAL_COMMUNITY): Payer: Self-pay

## 2023-09-01 DIAGNOSIS — R069 Unspecified abnormalities of breathing: Secondary | ICD-10-CM | POA: Diagnosis not present

## 2023-09-01 DIAGNOSIS — R0689 Other abnormalities of breathing: Secondary | ICD-10-CM | POA: Diagnosis not present

## 2023-09-01 DIAGNOSIS — R0602 Shortness of breath: Secondary | ICD-10-CM | POA: Diagnosis not present

## 2023-09-01 DIAGNOSIS — R6889 Other general symptoms and signs: Secondary | ICD-10-CM | POA: Diagnosis not present

## 2023-09-01 DIAGNOSIS — J441 Chronic obstructive pulmonary disease with (acute) exacerbation: Secondary | ICD-10-CM | POA: Diagnosis not present

## 2023-09-01 DIAGNOSIS — Z743 Need for continuous supervision: Secondary | ICD-10-CM | POA: Diagnosis not present

## 2023-09-01 DIAGNOSIS — R4702 Dysphasia: Secondary | ICD-10-CM | POA: Diagnosis not present

## 2023-09-01 LAB — BASIC METABOLIC PANEL WITH GFR
Anion gap: 11 (ref 5–15)
BUN: 15 mg/dL (ref 8–23)
CO2: 22 mmol/L (ref 22–32)
Calcium: 9.2 mg/dL (ref 8.9–10.3)
Chloride: 107 mmol/L (ref 98–111)
Creatinine, Ser: 0.97 mg/dL (ref 0.61–1.24)
GFR, Estimated: >60 mL/min (ref >60)
Glucose, Bld: 100 mg/dL — ABNORMAL HIGH (ref 70–99)
Potassium: 3.6 mmol/L (ref 3.5–5.1)
Sodium: 140 mmol/L (ref 135–145)

## 2023-09-01 LAB — I-STAT VENOUS BLOOD GAS, ED
Acid-Base Excess: 0 mmol/L (ref 0.0–2.0)
Bicarbonate: 24.4 mmol/L (ref 20.0–28.0)
Calcium, Ion: 1.1 mmol/L — ABNORMAL LOW (ref 1.15–1.40)
HCT: 39 % (ref 39.0–52.0)
Hemoglobin: 13.3 g/dL (ref 13.0–17.0)
O2 Saturation: 64 %
Potassium: 3.9 mmol/L (ref 3.5–5.1)
Sodium: 142 mmol/L (ref 135–145)
TCO2: 26 mmol/L (ref 22–32)
pCO2, Ven: 38.1 mmHg — ABNORMAL LOW (ref 44–60)
pH, Ven: 7.415 (ref 7.25–7.43)
pO2, Ven: 33 mmHg (ref 32–45)

## 2023-09-01 LAB — CBC WITH DIFFERENTIAL/PLATELET
Abs Immature Granulocytes: 0.01 10*3/uL (ref 0.00–0.07)
Basophils Absolute: 0.1 10*3/uL (ref 0.0–0.1)
Basophils Relative: 1 %
Eosinophils Absolute: 0.9 10*3/uL — ABNORMAL HIGH (ref 0.0–0.5)
Eosinophils Relative: 10 %
HCT: 41 % (ref 39.0–52.0)
Hemoglobin: 13.3 g/dL (ref 13.0–17.0)
Immature Granulocytes: 0 %
Lymphocytes Relative: 50 %
Lymphs Abs: 4.4 10*3/uL — ABNORMAL HIGH (ref 0.7–4.0)
MCH: 33.4 pg (ref 26.0–34.0)
MCHC: 32.4 g/dL (ref 30.0–36.0)
MCV: 103 fL — ABNORMAL HIGH (ref 80.0–100.0)
Monocytes Absolute: 0.5 10*3/uL (ref 0.1–1.0)
Monocytes Relative: 6 %
Neutro Abs: 2.9 10*3/uL (ref 1.7–7.7)
Neutrophils Relative %: 33 %
Platelets: 293 10*3/uL (ref 150–400)
RBC: 3.98 MIL/uL — ABNORMAL LOW (ref 4.22–5.81)
RDW: 13.9 % (ref 11.5–15.5)
WBC: 8.7 10*3/uL (ref 4.0–10.5)
nRBC: 0 % (ref 0.0–0.2)

## 2023-09-01 MED ORDER — ALBUTEROL SULFATE HFA 108 (90 BASE) MCG/ACT IN AERS
2.0000 | INHALATION_SPRAY | RESPIRATORY_TRACT | Status: AC
  Administered 2023-09-01: 2 via RESPIRATORY_TRACT
  Filled 2023-09-01: qty 6.7

## 2023-09-01 MED ORDER — ACETAMINOPHEN 500 MG PO TABS
1000.0000 mg | ORAL_TABLET | Freq: Once | ORAL | Status: AC
  Administered 2023-09-01: 1000 mg via ORAL
  Filled 2023-09-01: qty 2

## 2023-09-01 MED ORDER — PREDNISONE 20 MG PO TABS
40.0000 mg | ORAL_TABLET | Freq: Every day | ORAL | 0 refills | Status: AC
  Filled 2023-09-01: qty 10, 5d supply, fill #0

## 2023-09-01 MED ORDER — IPRATROPIUM-ALBUTEROL 0.5-2.5 (3) MG/3ML IN SOLN
3.0000 mL | RESPIRATORY_TRACT | Status: AC
  Filled 2023-09-01: qty 6

## 2023-09-01 MED ORDER — IPRATROPIUM-ALBUTEROL 0.5-2.5 (3) MG/3ML IN SOLN
RESPIRATORY_TRACT | Status: AC
  Administered 2023-09-01: 3 mL via RESPIRATORY_TRACT
  Filled 2023-09-01: qty 3

## 2023-09-01 NOTE — ED Triage Notes (Addendum)
 [facility] bib [facility] with c/o respiratroy distress/sob that started aorund 1700 today. EMS reports patient initally showing increased work of breathing with 30RR. Wheezing in all fields. Pt reports no improvement after EMS treatment but EMS states respirations decreased. Pt denies cp. Pt does report smoking cigarettes today and does have COPD hx. GCS 15.   EMS pre-tx: 10 albuterol  0.5 atrovent  125 solumedrol 2g mag

## 2023-09-01 NOTE — Discharge Instructions (Addendum)
 Please use albuterol  inhaler every 4 hours for the next 24 hours.  Please use breathing treatments as previously prescribed.  Please take prednisone  2 tablest (40mg ) daily for the next 5 days Thank you for allowing us  to take care of you today.  We hope you begin feeling better soon.   To-Do:  Please follow-up with your primary doctor within the next 2-3 days. Please return to the Emergency Department or call 911 if you experience chest pain, shortness of breath, severe pain, severe fever, altered mental status, or have any reason to think that you need emergency medical care.  Thank you again.  Hope you feel better soon.  Arlin Benes Department of Emergency Medicine

## 2023-09-01 NOTE — ED Provider Notes (Signed)
 [place] EMERGENCY DEPARTMENT AT [facility] Provider Note   CSN: [id number] Arrival date & time: 09/01/23  1814     History Chief Complaint  Patient presents with   Respiratory Distress    Jason Wong is a 68 y.o. male w/ PMHx COPD who presents to the ED for evaluation of shortness of breath.  Patient reports gradually worsening wheezing and shortness of breath throughout the evening.  No improvement with inhaler.  Patient denies any chest pain.  No fevers chills or congestion.  Patient reports chronic cough.  No sputum production.  EMS administered magnesium  Solu-Medrol  and Atrovent  prior to arrival.       Physical Exam Updated Vital Signs BP 132/87   Pulse 81   Resp 16   Ht 5' 7 (1.702 m)   SpO2 100%   BMI 18.58 kg/m  Physical Exam Vitals and nursing note reviewed.  Constitutional:      General: He is not in acute distress.    Appearance: He is well-developed.   Cardiovascular:     Rate and Rhythm: Normal rate and regular rhythm.     Pulses: Normal pulses.     Heart sounds: Normal heart sounds. No murmur heard. Pulmonary:     Effort: Pulmonary effort is normal. Tachypnea and prolonged expiration present.     Breath sounds: Wheezing present.  Abdominal:     Palpations: Abdomen is soft.     Tenderness: There is no abdominal tenderness.   Musculoskeletal:     Cervical back: Neck supple.     Right lower leg: No edema.     Left lower leg: No edema.   Skin:    General: Skin is warm and dry.     Capillary Refill: Capillary refill takes less than 2 seconds.   Neurological:     General: No focal deficit present.     Mental Status: He is alert.   Psychiatric:        Mood and Affect: Mood normal.     ED Results / Procedures / Treatments   Labs (all labs ordered are listed, but only abnormal results are displayed) Labs Reviewed  BASIC METABOLIC PANEL WITH GFR - Abnormal; Notable for the following components:      Result Value   Glucose, Bld  100 (*)    All other components within normal limits  CBC WITH DIFFERENTIAL/PLATELET - Abnormal; Notable for the following components:   RBC 3.98 (*)    MCV 103.0 (*)    Lymphs Abs 4.4 (*)    Eosinophils Absolute 0.9 (*)    All other components within normal limits  I-STAT VENOUS BLOOD GAS, ED - Abnormal; Notable for the following components:   pCO2, Ven 38.1 (*)    Calcium , Ion 1.10 (*)    All other components within normal limits    EKG EKG Interpretation Date/Time:  Friday September 01 2023 18:43:14 EDT Ventricular Rate:  84 PR Interval:  139 QRS Duration:  76 QT Interval:  365 QTC Calculation: 432 R Axis:   80  Text Interpretation: Sinus rhythm Consider right atrial enlargement Nonspecific ST abnormality Confirmed by Shyrl Doyne [phone]) on 09/01/2023 9:05:56 PM  Radiology DG Chest Port 1 View Result Date: 09/01/2023 CLINICAL DATA:  COPD exacerbation. EXAM: PORTABLE CHEST 1 VIEW COMPARISON:  X-ray 08/24/2023 and older FINDINGS: No consolidation, pneumothorax or effusion. No edema. Normal cardiopericardial silhouette. Overlapping cardiac leads. IMPRESSION: No acute osseous abnormality. Electronically Signed   By: Adrianna Horde M.D.   On:  09/01/2023 19:03    Medications Ordered in ED Medications  ipratropium-albuterol  (DUONEB) 0.5-2.5 (3) MG/3ML nebulizer solution 3 mL (3 mLs Nebulization Not Given 09/01/23 1923)  acetaminophen  (TYLENOL ) tablet 1,000 mg (1,000 mg Oral Given 09/01/23 2101)  albuterol  (VENTOLIN  HFA) 108 (90 Base) MCG/ACT inhaler 2 puff (2 puffs Inhalation Given 09/01/23 2151)    ED Course/ Medical Decision Making/ A&P  Jason Wong is a 68 y.o. male presents as detailed above  Differential ddx: COPD exacerbation, anemia, effusion, arrhythmia, hypercarbia, hypoxia  On arrival, patient afebrile hemodynamically stable no hypoxia or respiratory distress patient does have mild tachypnea and increased muscle use.  Diffuse wheezing.  ED Work-up: Please see details  of labs and imaging listed above. In summary, patient received 3 DuoNeb treatments.  Patient also received Tylenol  for headache.  No significant lab abnormalities.  Chest x-ray without acute cardiopulmonary abnormality.  EKG without evidence of ischemia or arrhythmia.  Patient ambulatory without hypoxia or increased work of breathing or tachycardia. Provided with Ventolin  inhaler.  Patient reports he has access to all home medications and nebulizer treatments   Overall impression mild COPD exacerbation.  Patient stable for discharge outpatient follow-up.  Patient provided course of prednisone  at discharge   Patient stable for discharge and outpatient follow-up. Strict return precautions provided. Patient voices understanding and agrees with plan.   Patient seen with supervising physician who agrees with plan.  Final Clinical Impression(s) / ED Diagnoses Final diagnoses:  COPD exacerbation (HCC)    Rx / DC Orders ED Discharge Orders          Ordered    predniSONE  (DELTASONE ) 20 MG tablet  Daily with breakfast        09/01/23 2124            Angele Keller, DO PGY-3 Emergency Medicine    Angele Keller, DO 09/01/23 2356    Ninetta Basket, MD 09/05/23 [phone]

## 2023-09-01 NOTE — ED Notes (Signed)
 Pt tolerated ambulating ok. Pt HR between 86-94 and 97% on RA throughout. EDP notified.

## 2023-09-02 ENCOUNTER — Other Ambulatory Visit: Payer: Self-pay

## 2023-09-02 ENCOUNTER — Other Ambulatory Visit (HOSPITAL_BASED_OUTPATIENT_CLINIC_OR_DEPARTMENT_OTHER): Payer: Self-pay

## 2023-09-04 ENCOUNTER — Other Ambulatory Visit: Payer: Self-pay

## 2023-09-07 ENCOUNTER — Telehealth: Payer: Self-pay | Admitting: Internal Medicine

## 2023-09-07 NOTE — Telephone Encounter (Signed)
 Contacted pt to confirm appt no vm to leave a message

## 2023-09-08 ENCOUNTER — Telehealth: Payer: Self-pay | Admitting: Internal Medicine

## 2023-09-08 NOTE — Telephone Encounter (Signed)
 2nd attempt: Contacted pt no vm to leave a message to confirm appt

## 2023-09-11 ENCOUNTER — Other Ambulatory Visit: Payer: Self-pay

## 2023-09-11 ENCOUNTER — Inpatient Hospital Stay: Admitting: Family Medicine

## 2023-09-11 ENCOUNTER — Emergency Department (HOSPITAL_COMMUNITY)
Admission: EM | Admit: 2023-09-11 | Discharge: 2023-09-11 | Disposition: A | Discharge status: Home / Self Care | Attending: Emergency Medicine | Admitting: Emergency Medicine

## 2023-09-11 ENCOUNTER — Emergency Department (HOSPITAL_COMMUNITY)

## 2023-09-11 ENCOUNTER — Encounter (HOSPITAL_COMMUNITY): Payer: Self-pay

## 2023-09-11 DIAGNOSIS — Z7952 Long term (current) use of systemic steroids: Secondary | ICD-10-CM | POA: Insufficient documentation

## 2023-09-11 DIAGNOSIS — R0602 Shortness of breath: Secondary | ICD-10-CM | POA: Diagnosis not present

## 2023-09-11 DIAGNOSIS — J441 Chronic obstructive pulmonary disease with (acute) exacerbation: Secondary | ICD-10-CM | POA: Insufficient documentation

## 2023-09-11 DIAGNOSIS — I700 Atherosclerosis of aorta: Secondary | ICD-10-CM | POA: Diagnosis not present

## 2023-09-11 DIAGNOSIS — J449 Chronic obstructive pulmonary disease, unspecified: Secondary | ICD-10-CM | POA: Diagnosis not present

## 2023-09-11 DIAGNOSIS — R918 Other nonspecific abnormal finding of lung field: Secondary | ICD-10-CM | POA: Diagnosis not present

## 2023-09-11 DIAGNOSIS — J439 Emphysema, unspecified: Secondary | ICD-10-CM | POA: Diagnosis not present

## 2023-09-11 DIAGNOSIS — I10 Essential (primary) hypertension: Secondary | ICD-10-CM | POA: Insufficient documentation

## 2023-09-11 DIAGNOSIS — S2232XA Fracture of one rib, left side, initial encounter for closed fracture: Secondary | ICD-10-CM | POA: Diagnosis not present

## 2023-09-11 LAB — CBC
HCT: 44.3 % (ref 39.0–52.0)
Hemoglobin: 14.4 g/dL (ref 13.0–17.0)
MCH: 33.1 pg (ref 26.0–34.0)
MCHC: 32.5 g/dL (ref 30.0–36.0)
MCV: 101.8 fL — ABNORMAL HIGH (ref 80.0–100.0)
Platelets: 271 10*3/uL (ref 150–400)
RBC: 4.35 MIL/uL (ref 4.22–5.81)
RDW: 13.4 % (ref 11.5–15.5)
WBC: 6 10*3/uL (ref 4.0–10.5)
nRBC: 0 % (ref 0.0–0.2)

## 2023-09-11 LAB — BASIC METABOLIC PANEL WITH GFR
Anion gap: 15 (ref 5–15)
BUN: 16 mg/dL (ref 8–23)
CO2: 23 mmol/L (ref 22–32)
Calcium: 9.7 mg/dL (ref 8.9–10.3)
Chloride: 104 mmol/L (ref 98–111)
Creatinine, Ser: 0.91 mg/dL (ref 0.61–1.24)
GFR, Estimated: >60 mL/min (ref >60)
Glucose, Bld: 95 mg/dL (ref 70–99)
Potassium: 4 mmol/L (ref 3.5–5.1)
Sodium: 142 mmol/L (ref 135–145)

## 2023-09-11 MED ORDER — PREDNISONE 20 MG PO TABS
40.0000 mg | ORAL_TABLET | Freq: Every day | ORAL | 0 refills | Status: DC
  Filled 2023-09-11: qty 8, 4d supply, fill #0

## 2023-09-11 MED ORDER — METHYLPREDNISOLONE SODIUM SUCC 125 MG IJ SOLR
125.0000 mg | Freq: Once | INTRAMUSCULAR | Status: AC
  Administered 2023-09-11: 125 mg via INTRAVENOUS
  Filled 2023-09-11: qty 2

## 2023-09-11 MED ORDER — ALBUTEROL SULFATE (2.5 MG/3ML) 0.083% IN NEBU
5.0000 mg | INHALATION_SOLUTION | Freq: Once | RESPIRATORY_TRACT | Status: AC
  Administered 2023-09-11: 5 mg via RESPIRATORY_TRACT
  Filled 2023-09-11: qty 6

## 2023-09-11 MED ORDER — IOHEXOL 350 MG/ML SOLN
28.0000 mL | Freq: Once | INTRAVENOUS | Status: AC | PRN
  Administered 2023-09-11: 28 mL via INTRAVENOUS

## 2023-09-11 MED ORDER — ALBUTEROL SULFATE HFA 108 (90 BASE) MCG/ACT IN AERS
1.0000 | INHALATION_SPRAY | RESPIRATORY_TRACT | Status: DC | PRN
  Administered 2023-09-11: 2 via RESPIRATORY_TRACT
  Filled 2023-09-11: qty 6.7

## 2023-09-11 MED ORDER — IPRATROPIUM BROMIDE 0.02 % IN SOLN
0.5000 mg | Freq: Once | RESPIRATORY_TRACT | Status: AC
  Administered 2023-09-11: 0.5 mg via RESPIRATORY_TRACT
  Filled 2023-09-11: qty 2.5

## 2023-09-11 NOTE — ED Triage Notes (Signed)
 Pt c.o SOB since last night, hx of COPD. Used his home nebulizer without relief and states he is out of his inhaler

## 2023-09-11 NOTE — ED Provider Triage Note (Signed)
 Emergency Medicine Provider Triage Evaluation Note  Jason Wong , a 68 y.o. male  was evaluated in triage.  Pt complains of shortness of breath.  Review of Systems  Positive: Wheezing cough Negative: Fever  Physical Exam  BP 127/87   Pulse 78   Temp (!) 97.3 F (36.3 C)   Resp (!) 24   SpO2 99%  Prolonged expirations with wheezes  Medical Decision Making  Medically screening exam initiated at 9:19 AM.  Appropriate orders placed.  Jason Wong was informed that the remainder of the evaluation will be completed by another provider, this initial triage assessment does not replace that evaluation, and the importance of remaining in the ED until their evaluation is complete.  Patient with dyspnea.  History of COPD.  More short of breath the last 2 days.  States he is supposed to have an appointment with the doctor at 915 today.  Will get blood work.  Will give breathing treatment.  Has been taking nebulizer but out of inhaler.  Will also give steroids.    Jason Lot, MD 09/11/23 [phone]

## 2023-09-11 NOTE — Discharge Instructions (Addendum)
 Follow-up with your primary care doctor.  You need to have your blood pressure rechecked.  Make sure that you are taking your medications.  Return to the emergency room if you have any worsening symptoms.

## 2023-09-11 NOTE — ED Provider Notes (Signed)
 [place] EMERGENCY DEPARTMENT AT [facility] Provider Note   CSN: [id number] Arrival date & time: 09/11/23  9143     Patient presents with: Shortness of Breath   Jason Wong is a 68 y.o. male.   Patient is a 68 year old male with a history of COPD, hypertension, hyperlipidemia, hepatitis C who presents with shortness of breath.  He said it started yesterday.  He was post to have an appointment with his doctor today but came here instead.  He uses nebulizer machine at home with no improvement in symptoms.  He ran out of his inhaler.  He is mostly has a dry cough.  No productive.  No fevers.  No associated chest pain.  No leg pain or swelling.       Prior to Admission medications   Medication Sig Start Date End Date Taking? Authorizing Provider  predniSONE  (DELTASONE ) 20 MG tablet Take 2 tablets (40 mg total) by mouth daily. 09/11/23  Yes Lenor Hollering, MD  albuterol  (VENTOLIN  HFA) 108 (90 Base) MCG/ACT inhaler Inhale 2 puffs into the lungs every 6 (six) hours as needed for wheezing or shortness of breath. 08/29/23   Vicci Barnie NOVAK, MD  arformoterol  (BROVANA ) 15 MCG/2ML NEBU Inhale 2 mLs (15 mcg total) into the lungs 2 (two) times daily. 08/28/23   Vicci Barnie NOVAK, MD  atorvastatin  (LIPITOR) 10 MG tablet Take 1 tablet (10 mg total) by mouth daily. 07/24/23   Vicci Barnie NOVAK, MD  budesonide  (PULMICORT ) 0.25 MG/2ML nebulizer solution Take 2 mLs (0.25 mg total) by nebulization 2 (two) times daily. 08/28/23   Vicci Barnie NOVAK, MD  folic acid  (FOLVITE ) 1 MG tablet Take 1 tablet (1 mg total) by mouth daily. 08/26/23   Amin, Ankit C, MD  ipratropium-albuterol  (DUONEB) 0.5-2.5 (3) MG/3ML SOLN Take 3 mLs by nebulization every 6 (six) hours as needed. 08/26/23   Amin, Ankit C, MD  Multiple Vitamin (MULTIVITAMIN WITH MINERALS) TABS tablet Take 1 tablet by mouth daily. Patient not taking: Reported on 08/28/2023 08/26/23   Caleen Burgess BROCKS, MD  pantoprazole  (PROTONIX ) 40 MG tablet Take 1  tablet (40 mg total) by mouth daily at 6 (six) AM. 07/24/23   Vicci Barnie NOVAK, MD  sodium chloride  (OCEAN) 0.65 % SOLN nasal spray Place 1 spray into both nostrils as needed for congestion. 07/24/23   Vicci Barnie NOVAK, MD  thiamine  (VITAMIN B1) 100 MG tablet Take 1 tablet (100 mg total) by mouth daily. 07/24/23   Vicci Barnie NOVAK, MD  umeclidinium bromide  (INCRUSE ELLIPTA ) 62.5 MCG/ACT AEPB Inhale 1 puff into the lungs daily. 08/28/23   Vicci Barnie NOVAK, MD    Allergies: Tobacco    Review of Systems  Constitutional:  Positive for fatigue. Negative for chills, diaphoresis and fever.  HENT:  Negative for congestion, rhinorrhea and sneezing.   Eyes: Negative.   Respiratory:  Positive for cough, shortness of breath and wheezing. Negative for chest tightness.   Cardiovascular:  Negative for chest pain and leg swelling.  Gastrointestinal:  Negative for abdominal pain, blood in stool, diarrhea, nausea and vomiting.  Genitourinary:  Negative for difficulty urinating, flank pain, frequency and hematuria.  Musculoskeletal:  Negative for arthralgias and back pain.  Skin:  Negative for rash.  Neurological:  Negative for dizziness, speech difficulty, weakness, numbness and headaches.    Updated Vital Signs BP (!) 154/105 (BP Location: Left Arm)   Pulse 73   Temp 98.7 F (37.1 C) (Oral)   Resp 20   SpO2 100%  Physical Exam Constitutional:      Appearance: He is well-developed.  HENT:     Head: Normocephalic and atraumatic.   Eyes:     Pupils: Pupils are equal, round, and reactive to light.    Cardiovascular:     Rate and Rhythm: Normal rate and regular rhythm.     Heart sounds: Normal heart sounds.  Pulmonary:     Effort: Pulmonary effort is normal. No respiratory distress.     Breath sounds: Decreased breath sounds and wheezing present. No rales.  Chest:     Chest wall: No tenderness.  Abdominal:     General: Bowel sounds are normal.     Palpations: Abdomen is soft.      Tenderness: There is no abdominal tenderness. There is no guarding or rebound.   Musculoskeletal:        General: Normal range of motion.     Cervical back: Normal range of motion and neck supple.     Comments: No edema or calf tenderness  Lymphadenopathy:     Cervical: No cervical adenopathy.   Skin:    General: Skin is warm and dry.     Findings: No rash.   Neurological:     Mental Status: He is alert and oriented to person, place, and time.    (all labs ordered are listed, but only abnormal results are displayed) Labs Reviewed  CBC - Abnormal; Notable for the following components:      Result Value   MCV 101.8 (*)    All other components within normal limits  BASIC METABOLIC PANEL WITH GFR    EKG: None  Radiology: CT Angio Chest PE W/Cm &/Or Wo Cm Result Date: 09/11/2023 CLINICAL DATA:  Shortness of breath. Concern for pulmonary embolism. EXAM: CT ANGIOGRAPHY CHEST WITH CONTRAST TECHNIQUE: Multidetector CT imaging of the chest was performed using the standard protocol during bolus administration of intravenous contrast. Multiplanar CT image reconstructions and MIPs were obtained to evaluate the vascular anatomy. RADIATION DOSE REDUCTION: This exam was performed according to the departmental dose-optimization program which includes automated exposure control, adjustment of the mA and/or kV according to patient size and/or use of iterative reconstruction technique. CONTRAST:  28mL OMNIPAQUE  IOHEXOL  350 MG/ML SOLN COMPARISON:  Chest CT dated 06/19/2022. FINDINGS: Cardiovascular: There is no cardiomegaly or pericardial effusion. There is mild atherosclerotic calcification of the thoracic aorta. No aneurysmal dilatation. No pulmonary embolus identified. Mediastinum/Nodes: No hilar or mediastinal adenopathy. The esophagus is grossly unremarkable. No mediastinal fluid collection. Lungs/Pleura: Background of emphysema. No focal consolidation, pleural effusion, or pneumothorax. The central  airways are patent. Upper Abdomen: No acute abnormality. Musculoskeletal: No chest wall abnormality. No acute or significant osseous findings. Review of the MIP images confirms the above findings. IMPRESSION: 1. No acute intrathoracic pathology. No CT evidence of pulmonary embolism. 2. Aortic Atherosclerosis (ICD10-I70.0) and Emphysema (ICD10-J43.9). Electronically Signed   By: Vanetta Chou M.D.   On: 09/11/2023 14:46   DG Chest 2 View Result Date: 09/11/2023 CLINICAL DATA:  Shortness of breath.  COPD. EXAM: CHEST - 2 VIEW COMPARISON:  09/01/2023 FINDINGS: Hyperinflation. Remote fifth posterolateral left rib fracture. Midline trachea. Normal heart size and mediastinal contours. No pleural effusion or pneumothorax. Possible 1.0 cm nodular density projecting over the right lower lobe laterally. Clear left lung. IMPRESSION: Hyperinflation, suggesting COPD.  No superimposed acute process. Possible 1.0 cm right lower lobe pulmonary nodule. Consider further evaluation with chest CT. Electronically Signed   By: Rockey Kilts M.D.   On:  09/11/2023 11:19     Procedures   Medications Ordered in the ED  albuterol  (VENTOLIN  HFA) 108 (90 Base) MCG/ACT inhaler 1-2 puff (has no administration in time range)  albuterol  (PROVENTIL ) (2.5 MG/3ML) 0.083% nebulizer solution 5 mg (5 mg Nebulization Given 09/11/23 0922)  ipratropium (ATROVENT ) nebulizer solution 0.5 mg (0.5 mg Nebulization Given 09/11/23 0922)  methylPREDNISolone  sodium succinate (SOLU-MEDROL ) 125 mg/2 mL injection 125 mg (125 mg Intravenous Given 09/11/23 0928)  albuterol  (PROVENTIL ) (2.5 MG/3ML) 0.083% nebulizer solution 5 mg (5 mg Nebulization Given 09/11/23 1337)  iohexol  (OMNIPAQUE ) 350 MG/ML injection 28 mL (28 mLs Intravenous Contrast Given 09/11/23 1325)                                    Medical Decision Making Amount and/or Complexity of Data Reviewed Radiology: ordered.  Risk Prescription drug management.   This patient presents to  the ED for concern of shortness of breath, this involves an extensive number of treatment options, and is a complaint that carries with it a high risk of complications and morbidity.  I considered the following differential and admission for this acute, potentially life threatening condition.  The differential diagnosis includes COPD exacerbation, pneumonia, asthma, pulmonary embolus, CHF, ACS, pneumothorax  MDM:    Patient presents with wheezing and shortness of breath.  He has a dry nonproductive cough.  He was given nebulizer treatments here in the ED and his breathing has improved.  He has no increased work of breathing.  No hypoxia.  Imaging studies do not reveal any evidence of pneumonia.  No evidence of PE.  No pneumothorax.  He does not have other symptoms that would be more concerning for ACS.  He was loaded with a dose of prednisone .  Will treat him for 4 more days.  Will give him another inhaler.  He says he has a nebulizer machine.  His blood pressures been elevated and instructed him to follow-up with his doctor to have this rechecked.  CRITICAL CARE Performed by: Andrea Ness Total critical care time: 60 minutes Critical care time was exclusive of separately billable procedures and treating other patients. Critical care was necessary to treat or prevent imminent or life-threatening deterioration. Critical care was time spent personally by me on the following activities: development of treatment plan with patient and/or surrogate as well as nursing, discussions with consultants, evaluation of patient's response to treatment, examination of patient, obtaining history from patient or surrogate, ordering and performing treatments and interventions, ordering and review of laboratory studies, ordering and review of radiographic studies, pulse oximetry and re-evaluation of patient's condition.   (Labs, imaging, consults)  Labs: I Ordered, and personally interpreted labs.  The pertinent  results include: Normal white count, nonconcerning electrolytes  Imaging Studies ordered: I ordered imaging studies including chest x-ray, CT chest I independently visualized and interpreted imaging. I agree with the radiologist interpretation  Additional history obtained from chart review.  External records from outside source obtained and reviewed including prior notes  Cardiac Monitoring: The patient was maintained on a cardiac monitor.  If on the cardiac monitor, I personally viewed and interpreted the cardiac monitored which showed an underlying rhythm of: Sinus tachycardia  Reevaluation: After the interventions noted above, I reevaluated the patient and found that they have :improved  Social Determinants of Health:  homeless  Disposition: Discharged to home  Co morbidities that complicate the patient evaluation  Past Medical History:  Diagnosis Date   Asthma    COPD (chronic obstructive pulmonary disease) (HCC)    Hepatitis C antibody positive in blood    HLD (hyperlipidemia)    HTN (hypertension)    On home oxygen  therapy    per pt 08-31-2021 uses 02 about every other day     Medicines Meds ordered this encounter  Medications   albuterol  (PROVENTIL ) (2.5 MG/3ML) 0.083% nebulizer solution 5 mg   ipratropium (ATROVENT ) nebulizer solution 0.5 mg   methylPREDNISolone  sodium succinate (SOLU-MEDROL ) 125 mg/2 mL injection 125 mg   albuterol  (PROVENTIL ) (2.5 MG/3ML) 0.083% nebulizer solution 5 mg   iohexol  (OMNIPAQUE ) 350 MG/ML injection 28 mL   predniSONE  (DELTASONE ) 20 MG tablet    Sig: Take 2 tablets (40 mg total) by mouth daily.    Dispense:  8 tablet    Refill:  0   albuterol  (VENTOLIN  HFA) 108 (90 Base) MCG/ACT inhaler 1-2 puff    I have reviewed the patients home medicines and have made adjustments as needed  Problem List / ED Course: Problem List Items Addressed This Visit       Respiratory   COPD exacerbation (HCC) - Primary   Relevant Medications    predniSONE  (DELTASONE ) 20 MG tablet   albuterol  (VENTOLIN  HFA) 108 (90 Base) MCG/ACT inhaler 1-2 puff             Final diagnoses:  COPD exacerbation [facility])    ED Discharge Orders          Ordered    predniSONE  (DELTASONE ) 20 MG tablet  Daily        09/11/23 1506               Lenor Hollering, MD 09/11/23 1511

## 2023-09-11 NOTE — ED Notes (Signed)
 Patient transported to x-ray. ?

## 2023-09-12 ENCOUNTER — Other Ambulatory Visit: Payer: Self-pay

## 2023-09-18 ENCOUNTER — Encounter (HOSPITAL_COMMUNITY): Payer: Self-pay | Admitting: *Deleted

## 2023-09-18 ENCOUNTER — Emergency Department (HOSPITAL_COMMUNITY)

## 2023-09-18 ENCOUNTER — Other Ambulatory Visit: Payer: Self-pay

## 2023-09-18 ENCOUNTER — Emergency Department (HOSPITAL_COMMUNITY)
Admission: EM | Admit: 2023-09-18 | Discharge: 2023-09-18 | Disposition: A | Discharge status: Home / Self Care | Attending: Emergency Medicine | Admitting: Emergency Medicine

## 2023-09-18 DIAGNOSIS — F17210 Nicotine dependence, cigarettes, uncomplicated: Secondary | ICD-10-CM | POA: Diagnosis not present

## 2023-09-18 DIAGNOSIS — J441 Chronic obstructive pulmonary disease with (acute) exacerbation: Secondary | ICD-10-CM | POA: Diagnosis not present

## 2023-09-18 DIAGNOSIS — R0602 Shortness of breath: Secondary | ICD-10-CM | POA: Diagnosis not present

## 2023-09-18 LAB — BASIC METABOLIC PANEL WITH GFR
Anion gap: 10 (ref 5–15)
BUN: 19 mg/dL (ref 8–23)
CO2: 25 mmol/L (ref 22–32)
Calcium: 9.4 mg/dL (ref 8.9–10.3)
Chloride: 106 mmol/L (ref 98–111)
Creatinine, Ser: 0.92 mg/dL (ref 0.61–1.24)
GFR, Estimated: >60 mL/min (ref >60)
Glucose, Bld: 89 mg/dL (ref 70–99)
Potassium: 4.4 mmol/L (ref 3.5–5.1)
Sodium: 141 mmol/L (ref 135–145)

## 2023-09-18 LAB — CBC WITH DIFFERENTIAL/PLATELET
Abs Immature Granulocytes: 0.02 10*3/uL (ref 0.00–0.07)
Basophils Absolute: 0 10*3/uL (ref 0.0–0.1)
Basophils Relative: 0 %
Eosinophils Absolute: 0.8 10*3/uL — ABNORMAL HIGH (ref 0.0–0.5)
Eosinophils Relative: 12 %
HCT: 45.2 % (ref 39.0–52.0)
Hemoglobin: 15 g/dL (ref 13.0–17.0)
Immature Granulocytes: 0 %
Lymphocytes Relative: 28 %
Lymphs Abs: 2 10*3/uL (ref 0.7–4.0)
MCH: 33.4 pg (ref 26.0–34.0)
MCHC: 33.2 g/dL (ref 30.0–36.0)
MCV: 100.7 fL — ABNORMAL HIGH (ref 80.0–100.0)
Monocytes Absolute: 0.6 10*3/uL (ref 0.1–1.0)
Monocytes Relative: 8 %
Neutro Abs: 3.8 10*3/uL (ref 1.7–7.7)
Neutrophils Relative %: 52 %
Platelets: 265 10*3/uL (ref 150–400)
RBC: 4.49 MIL/uL (ref 4.22–5.81)
RDW: 13.3 % (ref 11.5–15.5)
WBC: 7.3 10*3/uL (ref 4.0–10.5)
nRBC: 0 % (ref 0.0–0.2)

## 2023-09-18 MED ORDER — ALBUTEROL SULFATE HFA 108 (90 BASE) MCG/ACT IN AERS
2.0000 | INHALATION_SPRAY | Freq: Four times a day (QID) | RESPIRATORY_TRACT | Status: DC
  Administered 2023-09-18 (×2): 2 via RESPIRATORY_TRACT
  Filled 2023-09-18 (×2): qty 6.7

## 2023-09-18 MED ORDER — IPRATROPIUM-ALBUTEROL 0.5-2.5 (3) MG/3ML IN SOLN
3.0000 mL | RESPIRATORY_TRACT | Status: AC
  Administered 2023-09-18 (×3): 3 mL via RESPIRATORY_TRACT
  Filled 2023-09-18: qty 9

## 2023-09-18 MED ORDER — DEXAMETHASONE SODIUM PHOSPHATE 10 MG/ML IJ SOLN
10.0000 mg | Freq: Once | INTRAMUSCULAR | Status: AC
  Administered 2023-09-18: 10 mg via INTRAMUSCULAR
  Filled 2023-09-18: qty 1

## 2023-09-18 MED ORDER — ALBUTEROL SULFATE HFA 108 (90 BASE) MCG/ACT IN AERS
1.0000 | INHALATION_SPRAY | Freq: Four times a day (QID) | RESPIRATORY_TRACT | 0 refills | Status: DC | PRN
  Filled 2023-09-18: qty 18, 25d supply, fill #0

## 2023-09-18 MED ORDER — METHYLPREDNISOLONE 4 MG PO TBPK
ORAL_TABLET | ORAL | 0 refills | Status: DC
  Filled 2023-09-18: qty 21, 6d supply, fill #0

## 2023-09-18 NOTE — ED Triage Notes (Signed)
 C/oi sob x 3 days worse today , used inhaler  ran out yest worse when walking

## 2023-09-18 NOTE — ED Provider Notes (Signed)
 [place] EMERGENCY DEPARTMENT AT [facility] Provider Note   CSN: [id number] Arrival date & time: 09/18/23  9050     Patient presents with: Shortness of Breath   Jason Wong is a 68 y.o. male with history of COPD who presents to the emergency department today for further evaluation of shortness of breath.  This started a couple of days ago.  Patient was seen evaluated for similar symptoms approximately 10 days ago.  Discharged on steroids which she took all of.  He states he was better for a few days and now worse again.  Patient states he smokes 1 cigarette/day.  He denies any chest tightness, fever, chills, drug use.    Shortness of Breath      Prior to Admission medications   Medication Sig Start Date End Date Taking? Authorizing Provider  albuterol  (VENTOLIN  HFA) 108 (90 Base) MCG/ACT inhaler Inhale 1-2 puffs into the lungs every 6 (six) hours as needed for wheezing or shortness of breath. 09/18/23  Yes Jalaila Caradonna M, PA-C  methylPREDNISolone  (MEDROL  DOSEPAK) 4 MG TBPK tablet Take by mouth taper from 4 doses each day to 1 dose and stop. 09/18/23  Yes Theotis, Aevah Stansbery M, PA-C  arformoterol  (BROVANA ) 15 MCG/2ML NEBU Inhale 2 mLs (15 mcg total) into the lungs 2 (two) times daily. 08/28/23   Vicci Barnie NOVAK, MD  atorvastatin  (LIPITOR) 10 MG tablet Take 1 tablet (10 mg total) by mouth daily. 07/24/23   Vicci Barnie NOVAK, MD  budesonide  (PULMICORT ) 0.25 MG/2ML nebulizer solution Take 2 mLs (0.25 mg total) by nebulization 2 (two) times daily. 08/28/23   Vicci Barnie NOVAK, MD  folic acid  (FOLVITE ) 1 MG tablet Take 1 tablet (1 mg total) by mouth daily. 08/26/23   Amin, Ankit C, MD  ipratropium-albuterol  (DUONEB) 0.5-2.5 (3) MG/3ML SOLN Take 3 mLs by nebulization every 6 (six) hours as needed. 08/26/23   Amin, Ankit C, MD  Multiple Vitamin (MULTIVITAMIN WITH MINERALS) TABS tablet Take 1 tablet by mouth daily. Patient not taking: Reported on 08/28/2023 08/26/23   Caleen Burgess BROCKS, MD   pantoprazole  (PROTONIX ) 40 MG tablet Take 1 tablet (40 mg total) by mouth daily at 6 (six) AM. 07/24/23   Vicci Barnie NOVAK, MD  predniSONE  (DELTASONE ) 20 MG tablet Take 2 tablets (40 mg total) by mouth daily. 09/11/23   Lenor Hollering, MD  sodium chloride  (OCEAN) 0.65 % SOLN nasal spray Place 1 spray into both nostrils as needed for congestion. 07/24/23   Vicci Barnie NOVAK, MD  thiamine  (VITAMIN B1) 100 MG tablet Take 1 tablet (100 mg total) by mouth daily. 07/24/23   Vicci Barnie NOVAK, MD  umeclidinium bromide  (INCRUSE ELLIPTA ) 62.5 MCG/ACT AEPB Inhale 1 puff into the lungs daily. 08/28/23   Vicci Barnie NOVAK, MD    Allergies: Tobacco    Review of Systems  Respiratory:  Positive for shortness of breath.   All other systems reviewed and are negative.   Updated Vital Signs BP (!) 130/90   Pulse 65   Temp 98.6 F (37 C) (Oral)   Resp 14   Ht 5' 7 (1.702 m)   Wt 54.4 kg   SpO2 98%   BMI 18.79 kg/m   Physical Exam Vitals and nursing note reviewed.  Constitutional:      General: He is not in acute distress.    Appearance: Normal appearance.  HENT:     Head: Normocephalic and atraumatic.   Eyes:     General:  Right eye: No discharge.        Left eye: No discharge.    Cardiovascular:     Comments: Regular rate and rhythm.  S1/S2 are distinct without any evidence of murmur, rubs, or gallops.  Radial pulses are 2+ bilaterally.  Dorsalis pedis pulses are 2+ bilaterally.  No evidence of pedal edema. Pulmonary:     Comments: Scant expiratory wheezing.  Abdominal:     General: Abdomen is flat. Bowel sounds are normal. There is no distension.     Tenderness: There is no abdominal tenderness. There is no guarding or rebound.   Musculoskeletal:        General: Normal range of motion.     Cervical back: Neck supple.   Skin:    General: Skin is warm and dry.     Findings: No rash.   Neurological:     General: No focal deficit present.     Mental Status: He is alert.    Psychiatric:        Mood and Affect: Mood normal.        Behavior: Behavior normal.     (all labs ordered are listed, but only abnormal results are displayed) Labs Reviewed  CBC WITH DIFFERENTIAL/PLATELET - Abnormal; Notable for the following components:      Result Value   MCV 100.7 (*)    Eosinophils Absolute 0.8 (*)    All other components within normal limits  BASIC METABOLIC PANEL WITH GFR    EKG: EKG Interpretation Date/Time:  Monday September 18 2023 09:59:57 EDT Ventricular Rate:  76 PR Interval:  136 QRS Duration:  80 QT Interval:  368 QTC Calculation: 414 R Axis:   84  Text Interpretation: Normal sinus rhythm Biatrial enlargement Nonspecific ST abnormality Abnormal ECG When compared with ECG of 01-Sep-2023 18:43, PREVIOUS ECG IS PRESENT Confirmed by Zackowski, Scott [phone]) on 09/18/2023 10:41:42 AM  Radiology: [facility] Chest 2 View Result Date: 09/18/2023 CLINICAL DATA:  Shortness of breath. EXAM: CHEST - 2 VIEW COMPARISON:  09/11/2023 and CT chest 09/11/2023. FINDINGS: Trachea is midline. Heart size normal. Lungs are hyperinflated but clear. No pleural fluid. Previously questioned pulmonary nodule in the right costophrenic angle did not have a CT correlate on same day CT chest 09/11/2023. IMPRESSION: Hyperinflation without acute finding. Electronically Signed   By: Newell Eke M.D.   On: 09/18/2023 11:28     Procedures   Medications Ordered in the ED  albuterol  (VENTOLIN  HFA) 108 (90 Base) MCG/ACT inhaler 2 puff (2 puffs Inhalation Given 09/18/23 1349)  ipratropium-albuterol  (DUONEB) 0.5-2.5 (3) MG/3ML nebulizer solution 3 mL (3 mLs Nebulization Given 09/18/23 1349)  dexamethasone  (DECADRON ) injection 10 mg (10 mg Intramuscular Given 09/18/23 1144)       Medical Decision Making Jason Wong is a 68 y.o. male patient who presents to the emergency department today for further evaluation of shortness of breath.  Likely COPD exacerbation.  Will get basic labs and a chest  x-ray to further assess.  Does have some scant wheezing.  Will plan to give him some steroids here and DuoNebs.  Patient is in no acute distress.  Patient ambulated without difficulty.  O2 sats were in the 93 to 95% range.  This was on room air.  Will prescribe him a Medrol  Dosepak give him another inhaler and plan to discharge home.  He will follow-up with his primary care doctor.  Strict return precautions were discussed.  He is safe for discharge at this time.  Risk Prescription drug  management.     Final diagnoses:  COPD exacerbation [facility])    ED Discharge Orders          Ordered    albuterol  (VENTOLIN  HFA) 108 (90 Base) MCG/ACT inhaler  Every 6 hours PRN        09/18/23 1410    methylPREDNISolone  (MEDROL  DOSEPAK) 4 MG TBPK tablet  (Dosepack) 4x daily tapering        09/18/23 1410               Theotis Peers Rachel, [place] 09/18/23 1412    Mannie Pac T, DO 09/23/23 1500

## 2023-09-18 NOTE — Discharge Instructions (Addendum)
 Please take the Medrol  Dosepak as prescribed.  I also have given you another albuterol  inhaler.  Please take this every 4 hours as needed.  You can return to the emergency department for any worsening symptoms.  Please follow-up with your primary care doctor for further evaluation otherwise.

## 2023-09-18 NOTE — ED Notes (Signed)
 Pt ambulated w/o assistance approx 168ft. Sats ranged between 93-95% pt had no complaints

## 2023-09-18 NOTE — ED Provider Triage Note (Signed)
 Emergency Medicine Provider Triage Evaluation Note  Jason Wong , a 68 y.o. male  was evaluated in triage.  Pt complains of shortness of breath for 3 days history of COPD.  Denies any fevers somewhat of a cough.  No chest pain.  Review of Systems  Positive: Shortness of breath cough Negative: Fevers chest pain  Physical Exam  BP (!) 150/96 (BP Location: Left Arm)   Pulse 78   Temp 98.4 F (36.9 C)   Resp (!) 22   Ht 1.702 m (5' 7)   Wt 54.4 kg   SpO2 97%   BMI 18.79 kg/m  Gen: Awake, no distress   Resp: Normal effort but bilateral wheezing and rhonchi. Heart regular rate and rhythm. Abdomen soft nontender.  MSK: Moves extremities without difficulty  Other:   EKG normal sinus rhythm nonspecific ST abnormality.  Not significantly changed from previous but does some change.   Medical Decision Making  Medically screening exam initiated at 10:43 AM.  Appropriate orders placed.  Jason Wong was informed that the remainder of the evaluation will be completed by another provider, this initial triage assessment does not replace that evaluation, and the importance of remaining in the ED until their evaluation is complete.  Will get two-view chest basic labs and give 2 puffs of albuterol  inhaler.     Sharea Guinther, MD 09/18/23 1049

## 2023-09-18 NOTE — ED Notes (Signed)
CCMD notified

## 2023-09-24 ENCOUNTER — Emergency Department (HOSPITAL_COMMUNITY)

## 2023-09-24 ENCOUNTER — Other Ambulatory Visit: Payer: Self-pay

## 2023-09-24 ENCOUNTER — Emergency Department (HOSPITAL_COMMUNITY)
Admission: EM | Admit: 2023-09-24 | Discharge: 2023-09-24 | Disposition: A | Discharge status: Home / Self Care | Attending: Emergency Medicine | Admitting: Emergency Medicine

## 2023-09-24 ENCOUNTER — Encounter (HOSPITAL_COMMUNITY): Payer: Self-pay

## 2023-09-24 DIAGNOSIS — J449 Chronic obstructive pulmonary disease, unspecified: Secondary | ICD-10-CM | POA: Insufficient documentation

## 2023-09-24 DIAGNOSIS — R0602 Shortness of breath: Secondary | ICD-10-CM | POA: Insufficient documentation

## 2023-09-24 DIAGNOSIS — I10 Essential (primary) hypertension: Secondary | ICD-10-CM | POA: Insufficient documentation

## 2023-09-24 DIAGNOSIS — Z79899 Other long term (current) drug therapy: Secondary | ICD-10-CM | POA: Insufficient documentation

## 2023-09-24 DIAGNOSIS — F17210 Nicotine dependence, cigarettes, uncomplicated: Secondary | ICD-10-CM | POA: Insufficient documentation

## 2023-09-24 LAB — CBC
HCT: 47.7 % (ref 39.0–52.0)
Hemoglobin: 15.6 g/dL (ref 13.0–17.0)
MCH: 33.1 pg (ref 26.0–34.0)
MCHC: 32.7 g/dL (ref 30.0–36.0)
MCV: 101.1 fL — ABNORMAL HIGH (ref 80.0–100.0)
Platelets: 241 K/uL (ref 150–400)
RBC: 4.72 MIL/uL (ref 4.22–5.81)
RDW: 13.6 % (ref 11.5–15.5)
WBC: 6.6 K/uL (ref 4.0–10.5)
nRBC: 0 % (ref 0.0–0.2)

## 2023-09-24 LAB — TROPONIN I (HIGH SENSITIVITY): Troponin I (High Sensitivity): 6 ng/L (ref <18)

## 2023-09-24 LAB — BASIC METABOLIC PANEL WITH GFR
Anion gap: 11 (ref 5–15)
BUN: 23 mg/dL (ref 8–23)
CO2: 23 mmol/L (ref 22–32)
Calcium: 9.5 mg/dL (ref 8.9–10.3)
Chloride: 105 mmol/L (ref 98–111)
Creatinine, Ser: 0.97 mg/dL (ref 0.61–1.24)
GFR, Estimated: >60 mL/min (ref >60)
Glucose, Bld: 74 mg/dL (ref 70–99)
Potassium: 4 mmol/L (ref 3.5–5.1)
Sodium: 139 mmol/L (ref 135–145)

## 2023-09-24 MED ORDER — ALBUTEROL SULFATE HFA 108 (90 BASE) MCG/ACT IN AERS
1.0000 | INHALATION_SPRAY | Freq: Once | RESPIRATORY_TRACT | Status: AC
  Administered 2023-09-24: 2 via RESPIRATORY_TRACT
  Filled 2023-09-24: qty 6.7

## 2023-09-24 NOTE — ED Triage Notes (Signed)
 Pt c/o SHOB  that has been getting progressively worse for past week. Pt denies pain or fevers.

## 2023-09-24 NOTE — Discharge Instructions (Signed)
 Please follow-up closely with your primary care doctor.  Please continue take your home nebulizer medications.  Please return if you begin to have chest pain or worsening shortness of breath with exertion.

## 2023-09-24 NOTE — ED Notes (Signed)
 Pt in bed, lungs sounds have improved, pt has diminished wheeze in the base, resps even and unlabored, pt states that he is breathing a little bit better, but states that he gets more short of breath when he walks around.

## 2023-09-24 NOTE — ED Provider Notes (Signed)
 [place] EMERGENCY DEPARTMENT AT [facility] Provider Note   CSN: [id number] Arrival date & time: 09/24/23  9079     Patient presents with: Shortness of Breath   Brandon Wiechman is a 68 y.o. male with past medical history significant for hypertension, COPD, tobacco abuse, alcohol abuse, hyperlipidemia who presents with concern for shortness of breath progressively worse for the last week.  Denies any pain, fevers, chest pain.  Does report the shortness of breath is worse with walking around.  Was seen in the ED around 1 week ago and had just completed a course of steroids.  He reports he tried his home inhaler this morning without significant relief.  He reports he still smokes about 1 cigarette/day.    Shortness of Breath      Prior to Admission medications   Medication Sig Start Date End Date Taking? Authorizing Provider  albuterol  (VENTOLIN  HFA) 108 (90 Base) MCG/ACT inhaler Inhale 1-2 puffs into the lungs every 6 (six) hours as needed for wheezing or shortness of breath. 09/18/23   Theotis Peers M, PA-C  arformoterol  (BROVANA ) 15 MCG/2ML NEBU Inhale 2 mLs (15 mcg total) into the lungs 2 (two) times daily. 08/28/23   Vicci Barnie NOVAK, MD  atorvastatin  (LIPITOR) 10 MG tablet Take 1 tablet (10 mg total) by mouth daily. 07/24/23   Vicci Barnie NOVAK, MD  budesonide  (PULMICORT ) 0.25 MG/2ML nebulizer solution Take 2 mLs (0.25 mg total) by nebulization 2 (two) times daily. 08/28/23   Vicci Barnie NOVAK, MD  folic acid  (FOLVITE ) 1 MG tablet Take 1 tablet (1 mg total) by mouth daily. 08/26/23   Amin, Ankit C, MD  ipratropium-albuterol  (DUONEB) 0.5-2.5 (3) MG/3ML SOLN Take 3 mLs by nebulization every 6 (six) hours as needed. 08/26/23   Amin, Ankit C, MD  methylPREDNISolone  (MEDROL  DOSEPAK) 4 MG TBPK tablet Take by mouth as directed. 09/18/23   Theotis Peers HERO, PA-C  Multiple Vitamin (MULTIVITAMIN WITH MINERALS) TABS tablet Take 1 tablet by mouth daily. Patient not taking: Reported on  08/28/2023 08/26/23   Caleen Burgess BROCKS, MD  pantoprazole  (PROTONIX ) 40 MG tablet Take 1 tablet (40 mg total) by mouth daily at 6 (six) AM. 07/24/23   Vicci Barnie NOVAK, MD  predniSONE  (DELTASONE ) 20 MG tablet Take 2 tablets (40 mg total) by mouth daily. 09/11/23   Lenor Hollering, MD  sodium chloride  (OCEAN) 0.65 % SOLN nasal spray Place 1 spray into both nostrils as needed for congestion. 07/24/23   Vicci Barnie NOVAK, MD  thiamine  (VITAMIN B1) 100 MG tablet Take 1 tablet (100 mg total) by mouth daily. 07/24/23   Vicci Barnie NOVAK, MD  umeclidinium bromide  (INCRUSE ELLIPTA ) 62.5 MCG/ACT AEPB Inhale 1 puff into the lungs daily. 08/28/23   Vicci Barnie NOVAK, MD    Allergies: Tobacco    Review of Systems  Respiratory:  Positive for shortness of breath.   All other systems reviewed and are negative.   Updated Vital Signs BP (!) 126/90 (BP Location: Right Arm)   Pulse 84   Temp 98.4 F (36.9 C)   Resp (!) 23   Ht 5' 7 (1.702 m)   Wt 54.4 kg   SpO2 100%   BMI 18.78 kg/m   Physical Exam Vitals and nursing note reviewed.  Constitutional:      General: He is not in acute distress.    Appearance: Normal appearance.  HENT:     Head: Normocephalic and atraumatic.  Eyes:     General:  Right eye: No discharge.        Left eye: No discharge.  Cardiovascular:     Rate and Rhythm: Normal rate and regular rhythm.     Heart sounds: No murmur heard.    No friction rub. No gallop.  Pulmonary:     Effort: Pulmonary effort is normal.     Breath sounds: Normal breath sounds.     Comments: Very mild end expiratory wheeze, otherwise no acute respiratory distress, mild tachypnea on arrival, but patient is resting comfortably in bed on my exam. Abdominal:     General: Bowel sounds are normal.     Palpations: Abdomen is soft.  Skin:    General: Skin is warm and dry.     Capillary Refill: Capillary refill takes less than 2 seconds.  Neurological:     Mental Status: He is alert and oriented to  person, place, and time.  Psychiatric:        Mood and Affect: Mood normal.        Behavior: Behavior normal.     (all labs ordered are listed, but only abnormal results are displayed) Labs Reviewed  CBC - Abnormal; Notable for the following components:      Result Value   MCV 101.1 (*)    All other components within normal limits  BASIC METABOLIC PANEL WITH GFR  TROPONIN I (HIGH SENSITIVITY)    EKG: None  Radiology: DG Chest 2 View Result Date: 09/24/2023 CLINICAL DATA:  Short of breath EXAM: CHEST - 2 VIEW COMPARISON:  09/18/2023 FINDINGS: Frontal and lateral views of the chest demonstrate a stable cardiac silhouette. No acute airspace disease, effusion, or pneumothorax. No acute bony abnormalities. IMPRESSION: 1. No acute intrathoracic process. Electronically Signed   By: Ozell Daring M.D.   On: 09/24/2023 10:30     Procedures   Medications Ordered in the ED  albuterol  (VENTOLIN  HFA) 108 (90 Base) MCG/ACT inhaler 1-2 puff (2 puffs Inhalation Given 09/24/23 1021)                                    Medical Decision Making Amount and/or Complexity of Data Reviewed Labs: ordered. Radiology: ordered.  Risk Prescription drug management.   This patient is a 68 y.o. male  who presents to the ED for concern of shob.   Differential diagnoses prior to evaluation: The emergent differential diagnosis includes, but is not limited to,  asthma exacerbation, COPD exacerbation, acute upper respiratory infection, acute bronchitis, chronic bronchitis, interstitial lung disease, ARDS, PE, pneumonia, atypical ACS, carbon monoxide poisoning, spontaneous pneumothorax, new CHF vs CHF exacerbation, versus other . This is not an exhaustive differential.   Past Medical History / Co-morbidities / Social History: hypertension, COPD, tobacco abuse, alcohol abuse, hyperlipidemia  Additional history: Chart reviewed. Pertinent results include: reviewed labwork, imaging from previous ED  visits  Physical Exam: Physical exam performed. The pertinent findings include: Very mild end expiratory wheeze, otherwise no acute respiratory distress, mild tachypnea on arrival, but patient is resting comfortably in bed on my exam.  No hypoxia. Afebrile. Normal heart rate and rhythm.  Lab Tests/Imaging studies: I personally interpreted labs/imaging and the pertinent results include: CBC, BMP unremarkable, troponin negative x 1 in context of no active chest pain and some report of some exertional dyspnea.  Independently turbid plain film chest x-ray shows no evidence of acute intrathoracic process.. I agree with the radiologist interpretation.  Cardiac  monitoring: EKG obtained and interpreted by myself and attending physician which shows: NSR, no acute st-t changes   Medications: I ordered medication including albuterol  inhaler for mild wheezing.  I have reviewed the patients home medicines and have made adjustments as needed.   Disposition: After consideration of the diagnostic results and the patients response to treatment, I feel that patient is stable for discharge at this time .   emergency department workup does not suggest an emergent condition requiring admission or immediate intervention beyond what has been performed at this time. The plan is: as above. The patient is safe for discharge and has been instructed to return immediately for worsening symptoms, change in symptoms or any other concerns.   Final diagnoses:  SOB (shortness of breath)    ED Discharge Orders     None          Rosan Sherlean VEAR DEVONNA 09/24/23 1119    Randol Simmonds, MD 09/25/23 1640

## 2023-09-24 NOTE — ED Notes (Signed)
 Pt in bed, reviewed d/c instructions and follow up, advised to return for any concerns or worsening symptoms. Pt requests food and a bus ticket, food given.  Pt ambulatory to bathroom with steady gait.

## 2023-09-25 ENCOUNTER — Emergency Department (HOSPITAL_COMMUNITY): Admission: EM | Admit: 2023-09-25 | Discharge: 2023-09-25 | Disposition: A | Discharge status: Home / Self Care

## 2023-09-25 ENCOUNTER — Other Ambulatory Visit (HOSPITAL_COMMUNITY): Payer: Self-pay

## 2023-09-25 ENCOUNTER — Other Ambulatory Visit: Payer: Self-pay

## 2023-09-25 ENCOUNTER — Encounter (HOSPITAL_COMMUNITY): Payer: Self-pay | Admitting: Emergency Medicine

## 2023-09-25 DIAGNOSIS — J441 Chronic obstructive pulmonary disease with (acute) exacerbation: Secondary | ICD-10-CM | POA: Insufficient documentation

## 2023-09-25 DIAGNOSIS — F17200 Nicotine dependence, unspecified, uncomplicated: Secondary | ICD-10-CM | POA: Diagnosis not present

## 2023-09-25 DIAGNOSIS — R0902 Hypoxemia: Secondary | ICD-10-CM | POA: Diagnosis not present

## 2023-09-25 DIAGNOSIS — R0602 Shortness of breath: Secondary | ICD-10-CM | POA: Diagnosis present

## 2023-09-25 DIAGNOSIS — I10 Essential (primary) hypertension: Secondary | ICD-10-CM | POA: Insufficient documentation

## 2023-09-25 DIAGNOSIS — R6889 Other general symptoms and signs: Secondary | ICD-10-CM | POA: Diagnosis not present

## 2023-09-25 LAB — RESP PANEL BY RT-PCR (RSV, FLU A&B, COVID)  RVPGX2
Influenza A by PCR: NEGATIVE
Influenza B by PCR: NEGATIVE
Resp Syncytial Virus by PCR: NEGATIVE
SARS Coronavirus 2 by RT PCR: NEGATIVE

## 2023-09-25 LAB — CBC
HCT: 42.3 % (ref 39.0–52.0)
Hemoglobin: 13.9 g/dL (ref 13.0–17.0)
MCH: 33.2 pg (ref 26.0–34.0)
MCHC: 32.9 g/dL (ref 30.0–36.0)
MCV: 101 fL — ABNORMAL HIGH (ref 80.0–100.0)
Platelets: 218 K/uL (ref 150–400)
RBC: 4.19 MIL/uL — ABNORMAL LOW (ref 4.22–5.81)
RDW: 13.8 % (ref 11.5–15.5)
WBC: 6.4 K/uL (ref 4.0–10.5)
nRBC: 0 % (ref 0.0–0.2)

## 2023-09-25 LAB — BASIC METABOLIC PANEL WITH GFR
Anion gap: 11 (ref 5–15)
BUN: 19 mg/dL (ref 8–23)
CO2: 22 mmol/L (ref 22–32)
Calcium: 8.9 mg/dL (ref 8.9–10.3)
Chloride: 108 mmol/L (ref 98–111)
Creatinine, Ser: 0.93 mg/dL (ref 0.61–1.24)
GFR, Estimated: >60 mL/min (ref >60)
Glucose, Bld: 99 mg/dL (ref 70–99)
Potassium: 3.7 mmol/L (ref 3.5–5.1)
Sodium: 141 mmol/L (ref 135–145)

## 2023-09-25 LAB — TROPONIN I (HIGH SENSITIVITY): Troponin I (High Sensitivity): 7 ng/L (ref <18)

## 2023-09-25 MED ORDER — PREDNISONE 20 MG PO TABS
40.0000 mg | ORAL_TABLET | Freq: Every day | ORAL | 0 refills | Status: DC
  Filled 2023-09-25: qty 8, 4d supply, fill #0

## 2023-09-25 MED ORDER — METHYLPREDNISOLONE SODIUM SUCC 125 MG IJ SOLR
125.0000 mg | INTRAMUSCULAR | Status: AC
  Administered 2023-09-25: 125 mg via INTRAVENOUS
  Filled 2023-09-25: qty 2

## 2023-09-25 MED ORDER — MAGNESIUM SULFATE IN D5W 1-5 GM/100ML-% IV SOLN
1.0000 g | Freq: Once | INTRAVENOUS | Status: AC
  Administered 2023-09-25: 1 g via INTRAVENOUS
  Filled 2023-09-25: qty 100

## 2023-09-25 MED ORDER — BUDESONIDE 0.25 MG/2ML IN SUSP
0.2500 mg | Freq: Two times a day (BID) | RESPIRATORY_TRACT | Status: DC
  Administered 2023-09-25: 0.25 mg via RESPIRATORY_TRACT
  Filled 2023-09-25: qty 2

## 2023-09-25 MED ORDER — IPRATROPIUM-ALBUTEROL 0.5-2.5 (3) MG/3ML IN SOLN
3.0000 mL | Freq: Once | RESPIRATORY_TRACT | Status: AC
  Administered 2023-09-25: 3 mL via RESPIRATORY_TRACT
  Filled 2023-09-25: qty 3

## 2023-09-25 MED ORDER — FLUTICASONE PROPIONATE HFA 44 MCG/ACT IN AERO: 2.0000 | INHALATION_SPRAY | Freq: Once | RESPIRATORY_TRACT | Status: DC

## 2023-09-25 MED ORDER — IPRATROPIUM-ALBUTEROL 0.5-2.5 (3) MG/3ML IN SOLN
3.0000 mL | Freq: Once | RESPIRATORY_TRACT | Status: AC
  Administered 2023-09-25: 3 mL via RESPIRATORY_TRACT
  Filled 2023-09-25: qty 6

## 2023-09-25 MED ORDER — BUDESON-GLYCOPYRROL-FORMOTEROL 160-9-4.8 MCG/ACT IN AERO
2.0000 | INHALATION_SPRAY | Freq: Once | RESPIRATORY_TRACT | Status: AC
  Administered 2023-09-25: 2 via RESPIRATORY_TRACT
  Filled 2023-09-25: qty 5.9

## 2023-09-25 MED ORDER — ALBUTEROL SULFATE (2.5 MG/3ML) 0.083% IN NEBU
2.5000 mg | INHALATION_SOLUTION | Freq: Once | RESPIRATORY_TRACT | Status: AC
  Administered 2023-09-25: 2.5 mg via RESPIRATORY_TRACT
  Filled 2023-09-25: qty 3

## 2023-09-25 NOTE — ED Provider Notes (Signed)
 [place] EMERGENCY DEPARTMENT AT [facility] Provider Note   CSN: [id number] Arrival date & time: 09/25/23  9375     Patient presents with: Shortness of Breath   Jason Wong is a 68 y.o. male.    Shortness of Breath   Patient is a 68 year old male with a past medical history significant for hypertension, COPD, current smoker, HLD  Patient presents emergency room today with complaints of shortness of breath progressively worsening over the past 1 week.  Was seen yesterday for similar symptoms and felt improved after breathing treatments here in the emergency department.  Was discharged home but states that he began feeling more short of breath again.  He currently resides in shelter here in [place].  He denies any fever, hemoptysis, chest pain.  Denies any leg swelling  Does endorse a dry cough.     Prior to Admission medications   Medication Sig Start Date End Date Taking? Authorizing Provider  albuterol  (VENTOLIN  HFA) 108 (90 Base) MCG/ACT inhaler Inhale 1-2 puffs into the lungs every 6 (six) hours as needed for wheezing or shortness of breath. 09/18/23   Theotis Peers M, PA-C  arformoterol  (BROVANA ) 15 MCG/2ML NEBU Inhale 2 mLs (15 mcg total) into the lungs 2 (two) times daily. 08/28/23   Vicci Barnie NOVAK, MD  atorvastatin  (LIPITOR) 10 MG tablet Take 1 tablet (10 mg total) by mouth daily. 07/24/23   Vicci Barnie NOVAK, MD  budesonide  (PULMICORT ) 0.25 MG/2ML nebulizer solution Take 2 mLs (0.25 mg total) by nebulization 2 (two) times daily. 08/28/23   Vicci Barnie NOVAK, MD  folic acid  (FOLVITE ) 1 MG tablet Take 1 tablet (1 mg total) by mouth daily. 08/26/23   Amin, Ankit C, MD  ipratropium-albuterol  (DUONEB) 0.5-2.5 (3) MG/3ML SOLN Take 3 mLs by nebulization every 6 (six) hours as needed. 08/26/23   Amin, Ankit C, MD  Multiple Vitamin (MULTIVITAMIN WITH MINERALS) TABS tablet Take 1 tablet by mouth daily. Patient not taking: Reported on 08/28/2023 08/26/23   Caleen Burgess BROCKS, MD   pantoprazole  (PROTONIX ) 40 MG tablet Take 1 tablet (40 mg total) by mouth daily at 6 (six) AM. 07/24/23   Vicci Barnie NOVAK, MD  predniSONE  (DELTASONE ) 20 MG tablet Take 2 tablets (40 mg total) by mouth daily. 09/25/23   Neldon Hamp RAMAN, PA  sodium chloride  (OCEAN) 0.65 % SOLN nasal spray Place 1 spray into both nostrils as needed for congestion. 07/24/23   Vicci Barnie NOVAK, MD  thiamine  (VITAMIN B1) 100 MG tablet Take 1 tablet (100 mg total) by mouth daily. 07/24/23   Vicci Barnie NOVAK, MD  umeclidinium bromide  (INCRUSE ELLIPTA ) 62.5 MCG/ACT AEPB Inhale 1 puff into the lungs daily. 08/28/23   Vicci Barnie NOVAK, MD    Allergies: Tobacco    Review of Systems  Respiratory:  Positive for shortness of breath.     Updated Vital Signs BP (!) 133/98   Pulse 75   Temp 98.2 F (36.8 C) (Oral)   Resp 20   SpO2 100%   Physical Exam Vitals and nursing note reviewed.  Constitutional:      General: He is not in acute distress. HENT:     Head: Normocephalic and atraumatic.     Nose: Nose normal.  Eyes:     General: No scleral icterus. Cardiovascular:     Rate and Rhythm: Normal rate and regular rhythm.     Pulses: Normal pulses.     Heart sounds: Normal heart sounds.  Pulmonary:  Effort: Pulmonary effort is normal. No respiratory distress.     Breath sounds: Wheezing present.     Comments: Prolonged expiration  Wheezing diffusely inspiratory/expiratory Abdominal:     Palpations: Abdomen is soft.     Tenderness: There is no abdominal tenderness.  Musculoskeletal:     Cervical back: Normal range of motion.     Right lower leg: No edema.     Left lower leg: No edema.  Skin:    General: Skin is warm and dry.     Capillary Refill: Capillary refill takes less than 2 seconds.  Neurological:     Mental Status: He is alert. Mental status is at baseline.  Psychiatric:        Mood and Affect: Mood normal.        Behavior: Behavior normal.     (all labs ordered are listed, but only  abnormal results are displayed) Labs Reviewed  CBC - Abnormal; Notable for the following components:      Result Value   RBC 4.19 (*)    MCV 101.0 (*)    All other components within normal limits  RESP PANEL BY RT-PCR (RSV, FLU A&B, COVID)  [id number]  BASIC METABOLIC PANEL WITH GFR  TROPONIN I (HIGH SENSITIVITY)    EKG: EKG Interpretation Date/Time:  Monday September 25 2023 06:35:01 EDT Ventricular Rate:  82 PR Interval:  149 QRS Duration:  85 QT Interval:  369 QTC Calculation: 431 R Axis:   86  Text Interpretation: Sinus rhythm Biatrial enlargement Borderline right axis deviation Low voltage, precordial leads Confirmed by Neysa Clap [phone]) on 09/25/2023 7:25:17 AM  Radiology: [facility] Chest 2 View Result Date: 09/24/2023 CLINICAL DATA:  Short of breath EXAM: CHEST - 2 VIEW COMPARISON:  09/18/2023 FINDINGS: Frontal and lateral views of the chest demonstrate a stable cardiac silhouette. No acute airspace disease, effusion, or pneumothorax. No acute bony abnormalities. IMPRESSION: 1. No acute intrathoracic process. Electronically Signed   By: Ozell Daring M.D.   On: 09/24/2023 10:30     Procedures   Medications Ordered in the ED  magnesium  sulfate IVPB 1 g 100 mL (1 g Intravenous New Bag/Given 09/25/23 0741)  budesonide  (PULMICORT ) nebulizer solution 0.25 mg (0.25 mg Nebulization Given 09/25/23 0741)  budesonide -glycopyrrolate -formoterol  (BREZTRI ) 160-9-4.8 MCG/ACT inhaler 2 puff (has no administration in time range)  ipratropium-albuterol  (DUONEB) 0.5-2.5 (3) MG/3ML nebulizer solution 3 mL (3 mLs Nebulization Given 09/25/23 0732)  ipratropium-albuterol  (DUONEB) 0.5-2.5 (3) MG/3ML nebulizer solution 3 mL (3 mLs Nebulization Given 09/25/23 0731)  methylPREDNISolone  sodium succinate (SOLU-MEDROL ) 125 mg/2 mL injection 125 mg (125 mg Intravenous Given 09/25/23 0733)    Clinical Course as of 09/25/23 0818  Mon Sep 25, 2023  0657 1 week of worsening SOB +cough No CP. No hemoptysis no leg swelling.   [WF]  0808 Personally viewed x-ray images from yesterday AP and lateral chest x-ray without any infiltrate or evidence of pulmonary edema.  Today with unremarkable CBC and BMP, troponin normal x 1 no chest pain.  COVID/RSV/influenza PCR pending.  EKG nonischemic, NSR [WF]    Clinical Course User Index [WF] Neldon Hamp RAMAN, [place]                                 Medical Decision Making Amount and/or Complexity of Data Reviewed Labs: ordered.  Risk Prescription drug management.    This patient presents to the ED for concern of shortness of breath, this  involves a number of treatment options, and is a complaint that carries with it a moderate to high risk of complications and morbidity. A differential diagnosis was considered for the patient's symptoms which is discussed below:   The causes for shortness of breath include but are not limited to Cardiac (AHF, pericardial effusion and tamponade, arrhythmias, ischemia, etc) Respiratory (COPD, asthma, pneumonia, pneumothorax, primary pulmonary hypertension, PE/VQ mismatch) Hematological (anemia) Neuromuscular (ALS, Guillain-Barr, etc)    Co morbidities: Discussed in HPI   Brief History:  Patient is a 68 year old male with a past medical history significant for hypertension, COPD, current smoker, HLD  Patient presents emergency room today with complaints of shortness of breath progressively worsening over the past 1 week.  Was seen yesterday for similar symptoms and felt improved after breathing treatments here in the emergency department.  Was discharged home but states that he began feeling more short of breath again.  He currently resides in shelter here in [place].  He denies any fever, hemoptysis, chest pain.  Denies any leg swelling  Does endorse a dry cough.    EMR reviewed including pt PMHx, past surgical history and past visits to ER.   See HPI for more details   Lab Tests:  Labs overall unremarkable.  Troponin  normal, BMP CBC, normal.   Imaging Studies: I personally viewed chest x-ray from yesterday no acute infiltrate or abnormal findings.   Cardiac Monitoring:  The patient was maintained on a cardiac monitor.  I personally viewed and interpreted the cardiac monitored which showed an underlying rhythm of: NSR EKG non-ischemic   Medicines ordered:  I ordered medication including DuoNeb x 3, magnesium , Solu-Medrol , nebulized Pulmicort  for breathing Reevaluation of the patient after these medicines showed that the patient improved I have reviewed the patients home medicines and have made adjustments as needed   Critical Interventions:     Consults/Attending Physician      Reevaluation:  After the interventions noted above I re-evaluated patient and found that they have :improved   Social Determinants of Health:  The patient's social determinants of health were a factor in the care of this patient    Problem List / ED Course:  Patient here with COPD exacerbation in the past 1 month.  Discussed with patient at length it seems that he is only using albuterol  as needed for shortness of breath does not have maintenance inhaler.  Discussed with pharmacy he was able to provide him with a Breztri  inhaler.  Will discharge home with oral prednisone , Brester inhaler, he states that he does have albuterol  inhaler currently.  Recommend close follow-up with [facility].  He is established here.  Patient ambulated without desaturation or significant dyspnea.  Feels comfortable with discharge home.  Will discharge with prednisone  which is brought to bedside by TOC.   Dispostion:  After consideration of the diagnostic results and the patients response to treatment, I feel that the patent would benefit from outpatient follow-up.  Final diagnoses:  Chronic obstructive pulmonary disease with acute exacerbation [facility])    ED Discharge Orders          Ordered    predniSONE  (DELTASONE )  20 MG tablet  Daily        09/25/23 0817               Neldon Hamp RAMAN, PA 09/25/23 1215    Neysa Caron PARAS, DO 09/25/23 [phone]

## 2023-09-25 NOTE — ED Triage Notes (Signed)
 Pt having progressively worse SOB from the homeless shelter. Pt got a duoneb and is wheezing

## 2023-09-25 NOTE — ED Notes (Signed)
 Pt. Ambulated up the hallway and back to bad with minimal complaints. Pt. O2 sat was 97 upon standing and held there until halfway up the hall. Pt. Did complain of being shob halfway through walk and o2 sat read 98 at that time. Pt. Returned to bed and hooked back up to monitor. O2 sat 96 with a good pleth at this time.

## 2023-09-25 NOTE — Discharge Instructions (Addendum)
 Please use the new inhaler we have provided you twice daily and follow up with the [facility] ASAP. You will need to continue using this maintenance inhaler (not albuterol  alone) and your primary care provider and you can work together on making sure you have access to this therapy.

## 2023-09-26 ENCOUNTER — Telehealth: Payer: Self-pay | Admitting: Internal Medicine

## 2023-09-26 NOTE — Telephone Encounter (Signed)
 Contacted pt phone kept ringing no vm to leave a message to confirmed appt

## 2023-09-28 ENCOUNTER — Ambulatory Visit: Admitting: Family Medicine

## 2023-10-02 ENCOUNTER — Inpatient Hospital Stay (HOSPITAL_COMMUNITY)
Admission: EM | Admit: 2023-10-02 | Discharge: 2023-10-05 | DRG: 191 | Disposition: A | Discharge status: Home / Self Care | Attending: Internal Medicine | Admitting: Internal Medicine

## 2023-10-02 ENCOUNTER — Other Ambulatory Visit: Payer: Self-pay

## 2023-10-02 ENCOUNTER — Encounter (HOSPITAL_COMMUNITY): Payer: Self-pay

## 2023-10-02 ENCOUNTER — Emergency Department (HOSPITAL_COMMUNITY)

## 2023-10-02 DIAGNOSIS — J441 Chronic obstructive pulmonary disease with (acute) exacerbation: Principal | ICD-10-CM | POA: Diagnosis present

## 2023-10-02 DIAGNOSIS — Z9109 Other allergy status, other than to drugs and biological substances: Secondary | ICD-10-CM

## 2023-10-02 DIAGNOSIS — K219 Gastro-esophageal reflux disease without esophagitis: Secondary | ICD-10-CM | POA: Diagnosis not present

## 2023-10-02 DIAGNOSIS — E7800 Pure hypercholesterolemia, unspecified: Secondary | ICD-10-CM

## 2023-10-02 DIAGNOSIS — Z79899 Other long term (current) drug therapy: Secondary | ICD-10-CM

## 2023-10-02 DIAGNOSIS — Z5900 Homelessness unspecified: Secondary | ICD-10-CM | POA: Diagnosis not present

## 2023-10-02 DIAGNOSIS — I10 Essential (primary) hypertension: Secondary | ICD-10-CM | POA: Diagnosis not present

## 2023-10-02 DIAGNOSIS — Z7951 Long term (current) use of inhaled steroids: Secondary | ICD-10-CM

## 2023-10-02 DIAGNOSIS — J432 Centrilobular emphysema: Secondary | ICD-10-CM

## 2023-10-02 DIAGNOSIS — R0602 Shortness of breath: Secondary | ICD-10-CM

## 2023-10-02 DIAGNOSIS — E785 Hyperlipidemia, unspecified: Secondary | ICD-10-CM | POA: Diagnosis not present

## 2023-10-02 DIAGNOSIS — Z8249 Family history of ischemic heart disease and other diseases of the circulatory system: Secondary | ICD-10-CM | POA: Diagnosis not present

## 2023-10-02 DIAGNOSIS — F17210 Nicotine dependence, cigarettes, uncomplicated: Secondary | ICD-10-CM | POA: Diagnosis not present

## 2023-10-02 DIAGNOSIS — Z1152 Encounter for screening for COVID-19: Secondary | ICD-10-CM

## 2023-10-02 DIAGNOSIS — R0609 Other forms of dyspnea: Secondary | ICD-10-CM | POA: Diagnosis not present

## 2023-10-02 DIAGNOSIS — F1490 Cocaine use, unspecified, uncomplicated: Secondary | ICD-10-CM | POA: Diagnosis present

## 2023-10-02 DIAGNOSIS — Z716 Tobacco abuse counseling: Secondary | ICD-10-CM

## 2023-10-02 LAB — RESP PANEL BY RT-PCR (RSV, FLU A&B, COVID)  RVPGX2
Influenza A by PCR: NEGATIVE
Influenza B by PCR: NEGATIVE
Resp Syncytial Virus by PCR: NEGATIVE
SARS Coronavirus 2 by RT PCR: NEGATIVE

## 2023-10-02 LAB — CBC
HCT: 42.5 % (ref 39.0–52.0)
Hemoglobin: 14 g/dL (ref 13.0–17.0)
MCH: 32.9 pg (ref 26.0–34.0)
MCHC: 32.9 g/dL (ref 30.0–36.0)
MCV: 99.8 fL (ref 80.0–100.0)
Platelets: 239 K/uL (ref 150–400)
RBC: 4.26 MIL/uL (ref 4.22–5.81)
RDW: 12.9 % (ref 11.5–15.5)
WBC: 6.8 K/uL (ref 4.0–10.5)
nRBC: 0 % (ref 0.0–0.2)

## 2023-10-02 LAB — BASIC METABOLIC PANEL WITH GFR
Anion gap: 15 (ref 5–15)
BUN: 13 mg/dL (ref 8–23)
CO2: 22 mmol/L (ref 22–32)
Calcium: 9.5 mg/dL (ref 8.9–10.3)
Chloride: 105 mmol/L (ref 98–111)
Creatinine, Ser: 1.09 mg/dL (ref 0.61–1.24)
GFR, Estimated: >60 mL/min (ref >60)
Glucose, Bld: 87 mg/dL (ref 70–99)
Potassium: 4 mmol/L (ref 3.5–5.1)
Sodium: 142 mmol/L (ref 135–145)

## 2023-10-02 LAB — TROPONIN I (HIGH SENSITIVITY)
Troponin I (High Sensitivity): 7 ng/L (ref <18)
Troponin I (High Sensitivity): 9 ng/L (ref <18)

## 2023-10-02 MED ORDER — METHYLPREDNISOLONE SODIUM SUCC 125 MG IJ SOLR
125.0000 mg | Freq: Once | INTRAMUSCULAR | Status: AC
  Administered 2023-10-02: 125 mg via INTRAVENOUS
  Filled 2023-10-02: qty 2

## 2023-10-02 MED ORDER — IPRATROPIUM-ALBUTEROL 0.5-2.5 (3) MG/3ML IN SOLN
3.0000 mL | Freq: Once | RESPIRATORY_TRACT | Status: AC
  Administered 2023-10-02: 3 mL via RESPIRATORY_TRACT

## 2023-10-02 MED ORDER — MAGNESIUM SULFATE 2 GM/50ML IV SOLN
2.0000 g | Freq: Once | INTRAVENOUS | Status: AC
  Administered 2023-10-02: 2 g via INTRAVENOUS
  Filled 2023-10-02: qty 50

## 2023-10-02 MED ORDER — IPRATROPIUM-ALBUTEROL 0.5-2.5 (3) MG/3ML IN SOLN
3.0000 mL | Freq: Once | RESPIRATORY_TRACT | Status: AC
  Administered 2023-10-02: 3 mL via RESPIRATORY_TRACT
  Filled 2023-10-02: qty 3

## 2023-10-02 MED ORDER — IPRATROPIUM-ALBUTEROL 0.5-2.5 (3) MG/3ML IN SOLN
3.0000 mL | Freq: Once | RESPIRATORY_TRACT | Status: AC
  Administered 2023-10-02: 3 mL via RESPIRATORY_TRACT
  Filled 2023-10-02: qty 6

## 2023-10-02 NOTE — ED Triage Notes (Signed)
 Pt came in via POV d/t SOB since last night & reports nothing he was given in past visits for same has worked & his PCP appointment isn't until 10/13/23. A/Ox4, denies current pain in triage.

## 2023-10-02 NOTE — ED Provider Triage Note (Cosign Needed Addendum)
 Emergency Medicine Provider Triage Evaluation Note  Jason Wong , a 68 y.o. male  was evaluated in triage.  Pt complains of shortness of breath on exertion.  Symptoms have been ongoing since last night, patient reports when he is up and moving around he feels short of breath and feels chest pain tries to come on.  History of COPD, daily tobacco use.  Patient said he does not take any medications daily, says my doctor took me off all of them.  Denies lower extremity edema, denies worsening cough.  Review of Systems  Positive: As above Negative: As above  Physical Exam  BP 126/85 (BP Location: Right Arm)   Pulse 83   Temp 98.4 F (36.9 C)   Resp 17   Ht 5' 7 (1.702 m)   Wt 53.5 kg   SpO2 97%   BMI 18.48 kg/m  Gen:   Awake, no distress   Resp:  Normal effort, diffuse expiratory wheeze MSK:   Moves extremities without difficulty  Other:    Medical Decision Making  Medically screening exam initiated at 4:11 PM.  Appropriate orders placed.  Jason Wong was informed that the remainder of the evaluation will be completed by another provider, this initial triage assessment does not replace that evaluation, and the importance of remaining in the ED until their evaluation is complete.  6:37: Patient was brought back for his second troponin and was extremely short of breath, audible wheezing noted.  I did order a DuoNeb to be completed in triage.    [name], PA-C 10/02/23 1613    [name], PA-C 10/02/23 [phone]

## 2023-10-02 NOTE — ED Provider Notes (Incomplete)
 [place] [facility] AT [facility] Provider Note   CSN: [id number] Arrival date & time: 10/02/23  1450     Patient presents with: Shortness of Breath   Jason Wong is a 68 y.o. male history of asthma, COPD, hyperlipidemia, hypertension presents with complaints of shortness of breath.  Symptoms have been ongoing since last night.  Symptoms are worse with exertion.  He does describe some chest tightness as well.  He has no cardiac history.  Reports that he has been admitted for COPD exacerbation in the past in the setting of pneumonia.  Has never been intubated before.  He does endorse a cough, no sore throat or fever at home. {Add pertinent medical, surgical, social history, OB history to YEP:67052}  Shortness of Breath  Past Medical History:  Diagnosis Date  . Asthma   . COPD (chronic obstructive pulmonary disease) (HCC)   . Hepatitis C antibody positive in blood   . HLD (hyperlipidemia)   . HTN (hypertension)   . On home oxygen  therapy    per pt 08-31-2021 uses 02 about every other day   Past Surgical History:  Procedure Laterality Date  . INCISE AND DRAIN ABCESS     dog bite       Prior to Admission medications   Medication Sig Start Date End Date Taking? Authorizing Provider  albuterol  (VENTOLIN  HFA) 108 (90 Base) MCG/ACT inhaler Inhale 1-2 puffs into the lungs every 6 (six) hours as needed for wheezing or shortness of breath. 09/18/23   Theotis Peers M, PA-C  arformoterol  (BROVANA ) 15 MCG/2ML NEBU Inhale 2 mLs (15 mcg total) into the lungs 2 (two) times daily. 08/28/23   Vicci Barnie NOVAK, MD  atorvastatin  (LIPITOR) 10 MG tablet Take 1 tablet (10 mg total) by mouth daily. 07/24/23   Vicci Barnie NOVAK, MD  budesonide  (PULMICORT ) 0.25 MG/2ML nebulizer solution Take 2 mLs (0.25 mg total) by nebulization 2 (two) times daily. 08/28/23   Vicci Barnie NOVAK, MD  folic acid  (FOLVITE ) 1 MG tablet Take 1 tablet (1 mg total) by mouth daily. 08/26/23   Amin, Ankit C,  MD  ipratropium-albuterol  (DUONEB) 0.5-2.5 (3) MG/3ML SOLN Take 3 mLs by nebulization every 6 (six) hours as needed. 08/26/23   Amin, Ankit C, MD  Multiple Vitamin (MULTIVITAMIN WITH MINERALS) TABS tablet Take 1 tablet by mouth daily. Patient not taking: Reported on 08/28/2023 08/26/23   Caleen Burgess BROCKS, MD  pantoprazole  (PROTONIX ) 40 MG tablet Take 1 tablet (40 mg total) by mouth daily at 6 (six) AM. 07/24/23   Vicci Barnie NOVAK, MD  predniSONE  (DELTASONE ) 20 MG tablet Take 2 tablets (40 mg total) by mouth daily. 09/25/23   Neldon Hamp RAMAN, PA  sodium chloride  (OCEAN) 0.65 % SOLN nasal spray Place 1 spray into both nostrils as needed for congestion. 07/24/23   Vicci Barnie NOVAK, MD  thiamine  (VITAMIN B1) 100 MG tablet Take 1 tablet (100 mg total) by mouth daily. 07/24/23   Vicci Barnie NOVAK, MD  umeclidinium bromide  (INCRUSE ELLIPTA ) 62.5 MCG/ACT AEPB Inhale 1 puff into the lungs daily. 08/28/23   Vicci Barnie NOVAK, MD    Allergies: Tobacco    Review of Systems  Respiratory:  Positive for shortness of breath.     Updated Vital Signs BP (!) 154/95 (BP Location: Left Arm)   Pulse 70   Temp 97.8 F (36.6 C) (Oral)   Resp 18   Ht 5' 7 (1.702 m)   Wt 53.5 kg   SpO2 96%  BMI 18.48 kg/m   Physical Exam  (all labs ordered are listed, but only abnormal results are displayed) Labs Reviewed  RESP PANEL BY RT-PCR (RSV, FLU A&B, COVID)  RVPGX2  CBC  BASIC METABOLIC PANEL WITH GFR  TROPONIN I (HIGH SENSITIVITY)  TROPONIN I (HIGH SENSITIVITY)    EKG: None  Radiology: DG Chest 2 View Result Date: 10/02/2023 CLINICAL DATA:  Shortness of breath. EXAM: CHEST - 2 VIEW COMPARISON:  09/24/2023 FINDINGS: AP and lateral views. AP is apical lordotic. Midline trachea. Normal heart size and mediastinal contours. No pleural effusion or pneumothorax. Clear lungs. IMPRESSION: No acute cardiopulmonary disease. Electronically Signed   By: Rockey Kilts M.D.   On: 10/02/2023 16:54    {Document cardiac monitor,  telemetry assessment procedure when appropriate:32947} Procedures   Medications Ordered in the ED  ipratropium-albuterol  (DUONEB) 0.5-2.5 (3) MG/3ML nebulizer solution 3 mL (3 mLs Nebulization Given 10/02/23 1839)      {Click here for ABCD2, HEART and other calculators REFRESH Note before signing:1}                              Medical Decision Making Risk Prescription drug management.   This patient presents to the ED with chief complaint(s) of shortness of breath.  The complaint involves an extensive differential diagnosis and also carries with it a high risk of complications and morbidity.   Pertinent past medical history as listed in HPI  The differential diagnosis includes  COPD, ACS, PE, URI Additional history obtained: Records reviewed Care Everywhere/External Records  Assessment and management:   Hemodynamically stable, nontoxic-appearing patient presents with complaints of dyspnea on exertion for the past 3 days.  States symptoms feel similar to prior COPD exacerbations.  Notes compliance with his inhalers.  Does describe some chest tightness.  Although he has no cardiac history.  EKG is without any ischemic changes.  Troponins without elevation.  No risk factors for PE.  No lower extremity edema or tenderness.  Does endorse a cough, no cough or congestion, sore throat or fever.  CBC is without leukocytosis.  His chest x-ray is clear.  His lung sounds are diminished with diffuse wheezing no rales.  Respiratory panel is negative.  Will administer breathing treatments overall he is clinically consistent with a COPD exacerbation.  Independent ECG interpretation:  Sinus rhythm  Independent labs interpretation:  The following labs were independently interpreted:  CBC, BMP both unremarkable, troponin without elevation, respiratory panel negative  Independent visualization and interpretation of imaging: I independently visualized the following imaging with scope of interpretation  limited to determining acute life threatening conditions related to emergency care:  Chest x-ray without any acute cardiopulmonary disease   Consultations obtained:   none  Disposition:   ***  Social Determinants of Health:   none  This note was dictated with voice recognition software.  Despite best efforts at proofreading, errors may have occurred which can change the documentation meaning.    {Document critical care time when appropriate  Document review of labs and clinical decision tools ie CHADS2VASC2, etc  Document your independent review of radiology images and any outside records  Document your discussion with family members, caretakers and with consultants  Document social determinants of health affecting pt's care  Document your decision making why or why not admission, treatments were needed:32947:::1}   Final diagnoses:  None    ED Discharge Orders     None

## 2023-10-02 NOTE — ED Notes (Signed)
 Patient stated he is too short of breath to attempt ambulating at this time.

## 2023-10-02 NOTE — ED Provider Notes (Signed)
  EMERGENCY DEPARTMENT AT [facility] Provider Note   CSN: [id number] Arrival date & time: 10/02/23  1450     Patient presents with: Shortness of Breath   Jason Wong is a 68 y.o. male history of asthma, COPD, hyperlipidemia, hypertension presents with complaints of shortness of breath.  Symptoms have been ongoing since last night.  {Add pertinent medical, surgical, social history, OB history to YEP:67052}  Shortness of Breath  Past Medical History:  Diagnosis Date  . Asthma   . COPD (chronic obstructive pulmonary disease) (HCC)   . Hepatitis C antibody positive in blood   . HLD (hyperlipidemia)   . HTN (hypertension)   . On home oxygen  therapy    per pt 08-31-2021 uses 02 about every other day   Past Surgical History:  Procedure Laterality Date  . INCISE AND DRAIN ABCESS     dog bite       Prior to Admission medications   Medication Sig Start Date End Date Taking? Authorizing Provider  albuterol  (VENTOLIN  HFA) 108 (90 Base) MCG/ACT inhaler Inhale 1-2 puffs into the lungs every 6 (six) hours as needed for wheezing or shortness of breath. 09/18/23   Theotis Peers M, PA-C  arformoterol  (BROVANA ) 15 MCG/2ML NEBU Inhale 2 mLs (15 mcg total) into the lungs 2 (two) times daily. 08/28/23   Vicci Barnie NOVAK, MD  atorvastatin  (LIPITOR) 10 MG tablet Take 1 tablet (10 mg total) by mouth daily. 07/24/23   Vicci Barnie NOVAK, MD  budesonide  (PULMICORT ) 0.25 MG/2ML nebulizer solution Take 2 mLs (0.25 mg total) by nebulization 2 (two) times daily. 08/28/23   Vicci Barnie NOVAK, MD  folic acid  (FOLVITE ) 1 MG tablet Take 1 tablet (1 mg total) by mouth daily. 08/26/23   Amin, Ankit C, MD  ipratropium-albuterol  (DUONEB) 0.5-2.5 (3) MG/3ML SOLN Take 3 mLs by nebulization every 6 (six) hours as needed. 08/26/23   Amin, Ankit C, MD  Multiple Vitamin (MULTIVITAMIN WITH MINERALS) TABS tablet Take 1 tablet by mouth daily. Patient not taking: Reported on 08/28/2023 08/26/23   Caleen Burgess BROCKS,  MD  pantoprazole  (PROTONIX ) 40 MG tablet Take 1 tablet (40 mg total) by mouth daily at 6 (six) AM. 07/24/23   Vicci Barnie NOVAK, MD  predniSONE  (DELTASONE ) 20 MG tablet Take 2 tablets (40 mg total) by mouth daily. 09/25/23   Neldon Hamp RAMAN, PA  sodium chloride  (OCEAN) 0.65 % SOLN nasal spray Place 1 spray into both nostrils as needed for congestion. 07/24/23   Vicci Barnie NOVAK, MD  thiamine  (VITAMIN B1) 100 MG tablet Take 1 tablet (100 mg total) by mouth daily. 07/24/23   Vicci Barnie NOVAK, MD  umeclidinium bromide  (INCRUSE ELLIPTA ) 62.5 MCG/ACT AEPB Inhale 1 puff into the lungs daily. 08/28/23   Vicci Barnie NOVAK, MD    Allergies: Tobacco    Review of Systems  Respiratory:  Positive for shortness of breath.     Updated Vital Signs BP (!) 154/95 (BP Location: Left Arm)   Pulse 70   Temp 97.8 F (36.6 C) (Oral)   Resp 18   Ht 5' 7 (1.702 m)   Wt 53.5 kg   SpO2 96%   BMI 18.48 kg/m   Physical Exam  (all labs ordered are listed, but only abnormal results are displayed) Labs Reviewed  RESP PANEL BY RT-PCR (RSV, FLU A&B, COVID)  RVPGX2  CBC  BASIC METABOLIC PANEL WITH GFR  TROPONIN I (HIGH SENSITIVITY)  TROPONIN I (HIGH SENSITIVITY)    EKG: None  Radiology: DG Chest 2 View Result Date: 10/02/2023 CLINICAL DATA:  Shortness of breath. EXAM: CHEST - 2 VIEW COMPARISON:  09/24/2023 FINDINGS: AP and lateral views. AP is apical lordotic. Midline trachea. Normal heart size and mediastinal contours. No pleural effusion or pneumothorax. Clear lungs. IMPRESSION: No acute cardiopulmonary disease. Electronically Signed   By: Rockey Kilts M.D.   On: 10/02/2023 16:54    {Document cardiac monitor, telemetry assessment procedure when appropriate:32947} Procedures   Medications Ordered in the ED  ipratropium-albuterol  (DUONEB) 0.5-2.5 (3) MG/3ML nebulizer solution 3 mL (3 mLs Nebulization Given 10/02/23 1839)      {Click here for ABCD2, HEART and other calculators REFRESH Note before  signing:1}                              Medical Decision Making  This patient presents to the ED with chief complaint(s) of *** .  The complaint involves an extensive differential diagnosis and also carries with it a high risk of complications and morbidity.   Pertinent past medical history as listed in HPI  The differential diagnosis includes  ***  Additional history obtained: Additional history obtained from {additional history:26846} Records reviewed Care Everywhere/External Records  Assessment and management:   ***  Independent ECG interpretation:  ***  Independent labs interpretation:  The following labs were independently interpreted:  CBC, BMP both unremarkable, troponin without elevation, respiratory panel negative  Independent visualization and interpretation of imaging: I independently visualized the following imaging with scope of interpretation limited to determining acute life threatening conditions related to emergency care:  Chest x-ray without any acute cardiopulmonary disease   Consultations obtained:   ***  Disposition:   ***  Social Determinants of Health:   none  This note was dictated with voice recognition software.  Despite best efforts at proofreading, errors may have occurred which can change the documentation meaning.    {Document critical care time when appropriate  Document review of labs and clinical decision tools ie CHADS2VASC2, etc  Document your independent review of radiology images and any outside records  Document your discussion with family members, caretakers and with consultants  Document social determinants of health affecting pt's care  Document your decision making why or why not admission, treatments were needed:32947:::1}   Final diagnoses:  None    ED Discharge Orders     None

## 2023-10-03 ENCOUNTER — Inpatient Hospital Stay (HOSPITAL_COMMUNITY)

## 2023-10-03 DIAGNOSIS — R0602 Shortness of breath: Secondary | ICD-10-CM | POA: Diagnosis present

## 2023-10-03 DIAGNOSIS — Z8249 Family history of ischemic heart disease and other diseases of the circulatory system: Secondary | ICD-10-CM | POA: Diagnosis not present

## 2023-10-03 DIAGNOSIS — I10 Essential (primary) hypertension: Secondary | ICD-10-CM | POA: Diagnosis present

## 2023-10-03 DIAGNOSIS — Z7951 Long term (current) use of inhaled steroids: Secondary | ICD-10-CM | POA: Diagnosis not present

## 2023-10-03 DIAGNOSIS — Z716 Tobacco abuse counseling: Secondary | ICD-10-CM | POA: Diagnosis not present

## 2023-10-03 DIAGNOSIS — F17210 Nicotine dependence, cigarettes, uncomplicated: Secondary | ICD-10-CM | POA: Diagnosis present

## 2023-10-03 DIAGNOSIS — Z1152 Encounter for screening for COVID-19: Secondary | ICD-10-CM | POA: Diagnosis not present

## 2023-10-03 DIAGNOSIS — R0609 Other forms of dyspnea: Secondary | ICD-10-CM | POA: Diagnosis not present

## 2023-10-03 DIAGNOSIS — Z79899 Other long term (current) drug therapy: Secondary | ICD-10-CM | POA: Diagnosis not present

## 2023-10-03 DIAGNOSIS — Z5900 Homelessness unspecified: Secondary | ICD-10-CM | POA: Diagnosis not present

## 2023-10-03 DIAGNOSIS — Z9109 Other allergy status, other than to drugs and biological substances: Secondary | ICD-10-CM | POA: Diagnosis not present

## 2023-10-03 DIAGNOSIS — F1490 Cocaine use, unspecified, uncomplicated: Secondary | ICD-10-CM | POA: Diagnosis present

## 2023-10-03 DIAGNOSIS — E785 Hyperlipidemia, unspecified: Secondary | ICD-10-CM | POA: Diagnosis present

## 2023-10-03 DIAGNOSIS — J441 Chronic obstructive pulmonary disease with (acute) exacerbation: Secondary | ICD-10-CM | POA: Diagnosis not present

## 2023-10-03 DIAGNOSIS — K219 Gastro-esophageal reflux disease without esophagitis: Secondary | ICD-10-CM | POA: Diagnosis present

## 2023-10-03 LAB — COMPREHENSIVE METABOLIC PANEL WITH GFR
ALT: 16 U/L (ref 0–44)
AST: 23 U/L (ref 15–41)
Albumin: 3.7 g/dL (ref 3.5–5.0)
Alkaline Phosphatase: 57 U/L (ref 38–126)
Anion gap: 12 (ref 5–15)
BUN: 16 mg/dL (ref 8–23)
CO2: 18 mmol/L — ABNORMAL LOW (ref 22–32)
Calcium: 9.2 mg/dL (ref 8.9–10.3)
Chloride: 107 mmol/L (ref 98–111)
Creatinine, Ser: 1.08 mg/dL (ref 0.61–1.24)
GFR, Estimated: >60 mL/min (ref >60)
Glucose, Bld: 171 mg/dL — ABNORMAL HIGH (ref 70–99)
Potassium: 4.8 mmol/L (ref 3.5–5.1)
Sodium: 137 mmol/L (ref 135–145)
Total Bilirubin: 1.3 mg/dL — ABNORMAL HIGH (ref 0.0–1.2)
Total Protein: 6.5 g/dL (ref 6.5–8.1)

## 2023-10-03 LAB — CBC
HCT: 42.2 % (ref 39.0–52.0)
Hemoglobin: 14.1 g/dL (ref 13.0–17.0)
MCH: 33.1 pg (ref 26.0–34.0)
MCHC: 33.4 g/dL (ref 30.0–36.0)
MCV: 99.1 fL (ref 80.0–100.0)
Platelets: 260 K/uL (ref 150–400)
RBC: 4.26 MIL/uL (ref 4.22–5.81)
RDW: 13 % (ref 11.5–15.5)
WBC: 4.7 K/uL (ref 4.0–10.5)
nRBC: 0 % (ref 0.0–0.2)

## 2023-10-03 LAB — ECHOCARDIOGRAM COMPLETE
AR max vel: 2.79 cm2
AV Area VTI: 2.98 cm2
AV Area mean vel: 2.89 cm2
AV Mean grad: 2 mmHg
AV Peak grad: 4.9 mmHg
Ao pk vel: 1.11 m/s
Area-P 1/2: 3.79 cm2
Height: 67 in
S' Lateral: 2.7 cm
Weight: 1888 [oz_av]

## 2023-10-03 LAB — RESPIRATORY PANEL BY PCR

## 2023-10-03 LAB — RAPID URINE DRUG SCREEN, HOSP PERFORMED
Amphetamines: NOT DETECTED
Barbiturates: NOT DETECTED
Benzodiazepines: NOT DETECTED
Cocaine: POSITIVE — AB
Opiates: NOT DETECTED
Tetrahydrocannabinol: NOT DETECTED

## 2023-10-03 LAB — BLOOD GAS, VENOUS
Acid-base deficit: 1.1 mmol/L (ref 0.0–2.0)
Bicarbonate: 22.6 mmol/L (ref 20.0–28.0)
O2 Saturation: 98.4 %
Patient temperature: 37
pCO2, Ven: 34 mmHg — ABNORMAL LOW (ref 44–60)
pH, Ven: 7.43 (ref 7.25–7.43)
pO2, Ven: 75 mmHg — ABNORMAL HIGH (ref 32–45)

## 2023-10-03 MED ORDER — ACETAMINOPHEN 650 MG RE SUPP
650.0000 mg | Freq: Four times a day (QID) | RECTAL | Status: DC | PRN
Start: 2023-10-03 — End: 2023-10-05

## 2023-10-03 MED ORDER — BUDESONIDE 0.25 MG/2ML IN SUSP
0.2500 mg | Freq: Two times a day (BID) | RESPIRATORY_TRACT | Status: DC
  Administered 2023-10-03 – 2023-10-05 (×5): 0.25 mg via RESPIRATORY_TRACT
  Filled 2023-10-03 (×5): qty 2

## 2023-10-03 MED ORDER — SALINE SPRAY 0.65 % NA SOLN: 1.0000 | NASAL | Status: DC | PRN

## 2023-10-03 MED ORDER — METHYLPREDNISOLONE SODIUM SUCC 40 MG IJ SOLR
40.0000 mg | Freq: Two times a day (BID) | INTRAMUSCULAR | Status: AC
  Administered 2023-10-03 (×2): 40 mg via INTRAVENOUS
  Filled 2023-10-03 (×2): qty 1

## 2023-10-03 MED ORDER — ADULT MULTIVITAMIN W/MINERALS CH
1.0000 | ORAL_TABLET | Freq: Every day | ORAL | Status: DC
  Administered 2023-10-03 – 2023-10-05 (×3): 1 via ORAL
  Filled 2023-10-03 (×3): qty 1

## 2023-10-03 MED ORDER — LORAZEPAM 2 MG/ML IJ SOLN: 1.0000 mg | INTRAMUSCULAR | Status: DC | PRN

## 2023-10-03 MED ORDER — LORAZEPAM 1 MG PO TABS: 1.0000 mg | ORAL_TABLET | ORAL | Status: DC | PRN

## 2023-10-03 MED ORDER — ADULT MULTIVITAMIN W/MINERALS CH: 1.0000 | ORAL_TABLET | Freq: Every day | ORAL | Status: DC

## 2023-10-03 MED ORDER — THIAMINE HCL 100 MG/ML IJ SOLN: 100.0000 mg | Freq: Every day | INTRAMUSCULAR | Status: DC

## 2023-10-03 MED ORDER — IPRATROPIUM-ALBUTEROL 0.5-2.5 (3) MG/3ML IN SOLN
3.0000 mL | Freq: Four times a day (QID) | RESPIRATORY_TRACT | Status: DC | PRN
  Administered 2023-10-03 – 2023-10-04 (×2): 3 mL via RESPIRATORY_TRACT
  Filled 2023-10-03 (×2): qty 3

## 2023-10-03 MED ORDER — THIAMINE MONONITRATE 100 MG PO TABS
100.0000 mg | ORAL_TABLET | Freq: Every day | ORAL | Status: DC
  Administered 2023-10-03 – 2023-10-05 (×3): 100 mg via ORAL
  Filled 2023-10-03 (×3): qty 1

## 2023-10-03 MED ORDER — FOLIC ACID 1 MG PO TABS: 1.0000 mg | ORAL_TABLET | Freq: Every day | ORAL | Status: DC

## 2023-10-03 MED ORDER — FOLIC ACID 1 MG PO TABS
1.0000 mg | ORAL_TABLET | Freq: Every day | ORAL | Status: DC
  Administered 2023-10-03 – 2023-10-05 (×3): 1 mg via ORAL
  Filled 2023-10-03 (×3): qty 1

## 2023-10-03 MED ORDER — UMECLIDINIUM BROMIDE 62.5 MCG/ACT IN AEPB
1.0000 | INHALATION_SPRAY | Freq: Every day | RESPIRATORY_TRACT | Status: DC
  Administered 2023-10-03 – 2023-10-05 (×3): 1 via RESPIRATORY_TRACT
  Filled 2023-10-03: qty 7

## 2023-10-03 MED ORDER — ONDANSETRON HCL 4 MG PO TABS: 4.0000 mg | ORAL_TABLET | Freq: Four times a day (QID) | ORAL | Status: DC | PRN

## 2023-10-03 MED ORDER — ALBUTEROL SULFATE (2.5 MG/3ML) 0.083% IN NEBU
2.5000 mg | INHALATION_SOLUTION | Freq: Once | RESPIRATORY_TRACT | Status: AC
  Administered 2023-10-03: 2.5 mg via RESPIRATORY_TRACT
  Filled 2023-10-03: qty 3

## 2023-10-03 MED ORDER — ONDANSETRON HCL 4 MG/2ML IJ SOLN: 4.0000 mg | Freq: Four times a day (QID) | INTRAMUSCULAR | Status: DC | PRN

## 2023-10-03 MED ORDER — THIAMINE MONONITRATE 100 MG PO TABS: 100.0000 mg | ORAL_TABLET | Freq: Every day | ORAL | Status: DC

## 2023-10-03 MED ORDER — DOXYCYCLINE HYCLATE 100 MG PO TABS
100.0000 mg | ORAL_TABLET | Freq: Two times a day (BID) | ORAL | Status: DC
  Administered 2023-10-03 – 2023-10-05 (×6): 100 mg via ORAL
  Filled 2023-10-03 (×6): qty 1

## 2023-10-03 MED ORDER — PREDNISONE 20 MG PO TABS
40.0000 mg | ORAL_TABLET | Freq: Every day | ORAL | Status: DC
  Administered 2023-10-04 – 2023-10-05 (×2): 40 mg via ORAL
  Filled 2023-10-03 (×2): qty 2

## 2023-10-03 MED ORDER — PANTOPRAZOLE SODIUM 40 MG PO TBEC
40.0000 mg | DELAYED_RELEASE_TABLET | Freq: Every day | ORAL | Status: DC
  Administered 2023-10-03 – 2023-10-05 (×3): 40 mg via ORAL
  Filled 2023-10-03 (×3): qty 1

## 2023-10-03 MED ORDER — HEPARIN SODIUM (PORCINE) 5000 UNIT/ML IJ SOLN
5000.0000 [IU] | Freq: Three times a day (TID) | INTRAMUSCULAR | Status: DC
  Administered 2023-10-03 – 2023-10-05 (×7): 5000 [IU] via SUBCUTANEOUS
  Filled 2023-10-03 (×7): qty 1

## 2023-10-03 MED ORDER — ACETAMINOPHEN 325 MG PO TABS: 650.0000 mg | ORAL_TABLET | Freq: Four times a day (QID) | ORAL | Status: DC | PRN

## 2023-10-03 NOTE — Progress Notes (Signed)
 Transition of Care [facility]) - Inpatient Brief Assessment   Patient Details  Name: Jason Wong MRN: [id number] Date of Birth: Oct 08, 1955  Transition of Care [facility]) CM/SW Contact:    Rosaline JONELLE Joe, RN Phone Number: 10/03/2023, 3:33 PM   Clinical Narrative: Patient admitted to the hospital for Acute COPD exacerbation - orders are in for treatment with solumedrol, nebulizer treatments.  Patient is currently homeless.  MSW in ER placed resources in AVs to include substance abuse counseling.  Smoking cessation resources  and housing resources will be added to AVS.  No Inpatient Care management needs at this time but RNCM will continue to follow the patient for needs as patient progresses.   Transition of Care Asessment: Insurance and Status: (P) Insurance coverage has been reviewed Patient has primary care physician: (P) Yes Home environment has been reviewed: (P) Homeless Prior level of function:: (P) Independent Prior/Current Home Services: (P) No current home services Social Drivers of Health Review: (P) SDOH reviewed no interventions necessary Readmission risk has been reviewed: (P) Yes Transition of care needs: (P) no transition of care needs at this time

## 2023-10-03 NOTE — ED Notes (Signed)
 Unit notified of patient transport to floor.

## 2023-10-03 NOTE — Discharge Instructions (Signed)

## 2023-10-03 NOTE — Progress Notes (Signed)
 Patient seen and examined.  Admitted early morning hours by nighttime hospitalist.  68 year old gentleman with ongoing smoking, history of COPD, cocaine user, hypertension hyperlipidemia presented with about 3 days of progressive shortness of breath.  In the emergency room afebrile.  Blood pressure stable.  On room air.  RVP negative.  Chest x-ray normal.  Treated with nebulizers and steroids due to significant bronchospasm and wheezing and admitted to the hospital.  On my exam, he stated some improvement of breathing and wanting to walk around and stretch.  No other overnight events.  COPD with acute exacerbation: Agree with admission to monitored unit because of severity of symptoms. Aggressive bronchodilator therapy, IV steroids-oral steroids taper, inhalational steroids, scheduled and as needed bronchodilators, deep breathing exercises, incentive spirometry, chest physiotherapy. Antibiotics due to severity of symptoms.  Doxycycline  for 7 days. Supplemental oxygen  to keep saturations more than 90%.  Walk in the hallway. Smoking, cocaine and alcohol cessation counseling done.  Placed on CIWA scale.  Nicotine  patch provided. Echocardiogram done, results pending.  Total time spent: 30 minutes.  Same-day admit.  No charge visit.

## 2023-10-03 NOTE — ED Notes (Signed)
 Nt called CCMD@1 :03am

## 2023-10-03 NOTE — Progress Notes (Signed)
 CSW added substance abuse resources to patient's AVS.  Edwin Dada, MSW, LCSW Transitions of Care  Clinical Social Worker II [phone]

## 2023-10-03 NOTE — ED Provider Notes (Signed)
  Physical Exam  BP (!) 139/101   Pulse 73   Temp 97.8 F (36.6 C)   Resp 18   Ht 5' 7 (1.702 m)   Wt 53.5 kg   SpO2 99%   BMI 18.48 kg/m   Physical Exam Vitals and nursing note reviewed.  Constitutional:      General: He is not in acute distress.    Appearance: He is well-developed.  HENT:     Head: Normocephalic and atraumatic.  Eyes:     Conjunctiva/sclera: Conjunctivae normal.  Cardiovascular:     Rate and Rhythm: Normal rate and regular rhythm.     Heart sounds: No murmur heard. Pulmonary:     Effort: Pulmonary effort is normal. No respiratory distress.     Breath sounds: Wheezing present.  Abdominal:     Palpations: Abdomen is soft.     Tenderness: There is no abdominal tenderness.  Musculoskeletal:        General: No swelling.     Cervical back: Neck supple.  Skin:    General: Skin is warm and dry.     Capillary Refill: Capillary refill takes less than 2 seconds.  Neurological:     Mental Status: He is alert.  Psychiatric:        Mood and Affect: Mood normal.     Procedures  Procedures  ED Course / MDM   Clinical Course as of 10/03/23 0223  Tue Oct 03, 2023  0011 COPD exacerbation. SOB for the last 3 days. Chest tightness. Given multiple breathing treatments, still shortness of breath on exertion. Failed road test. Still smoking, last cigarette yesterday. [CG]  0223 Rapid urine drug screen (hospital performed) [CG]    Clinical Course User Index [CG] Ruthell Lonni FALCON, PA-C   Medical Decision Making Risk Prescription drug management. Decision regarding hospitalization.   68 year old male signed out to me at shift change pending reevaluation.  In short, 68 year old male still smoking presents with shortness of breath worsening over the last 3 days.  History of COPD.  Patient received multiple DuoNebs, Solu-Medrol  without relief.  His lab work is grossly unremarkable, his chest x-ray is clear.  He was ambulated in did maintain oxygen  saturation  however became very dyspneic and shortness of breath, began screaming out that he could not breathe.  He reports he does not feel comfortable or safe going home.  Due to this, have decided to admit patient to the hospital.  Discussed with Dr. Debby who has agreed to admit the patient to the hospital.  The patient is amenable to the plan.  He is stable on admission.       Ruthell Lonni FALCON, PA-C 10/03/23 0100    Haze Lonni PARAS, MD 10/03/23 [phone]

## 2023-10-03 NOTE — H&P (Signed)
 History and Physical    Jason Wong FMW:[id number] DOB: [birth year] DOA: 10/02/2023  PCP: Vicci Barnie NOVAK, MD  Patient coming from: home  I have personally briefly reviewed patient's old medical records in [facility]  Chief Complaint: sob progressive x 3 days  HPI: Jason Wong is a 68 y.o. male with medical history significant of  chronic tobacco use/COPD, HTN, HLD, GERD, cocaine use, alcohol abuse  presents to ED with complaint of sob progressive x 3 days. Of note patient has had multiple ED visits over the last month for the same with last hospital admission 6/05 at which time he was also treated for COPD.Patient notes home regimen is not helpful. He states he is not able to catch his breath.  He notes no fever/chills/ cough / chest pain or abdominal pain.   ED Course:  Afeb, BP 126/85, hr 93, rr 17 sat 97% RVP neg  Wbc 6.8, hgb 14, plt 239 Na 142, K 4, Cl 105, glu87, cr 1.09 CE 7 Cxr:NAD EKG:NSR Tx methyl prednisone  , Doneb, magnesium   Review of Systems: As per HPI otherwise 10 point review of systems negative.   Past Medical History:  Diagnosis Date   Asthma    COPD (chronic obstructive pulmonary disease) (HCC)    Hepatitis C antibody positive in blood    HLD (hyperlipidemia)    HTN (hypertension)    On home oxygen  therapy    per pt 08-31-2021 uses 02 about every other day    Past Surgical History:  Procedure Laterality Date   INCISE AND DRAIN ABCESS     dog bite     reports that he has been smoking cigarettes. He has never used smokeless tobacco. He reports current alcohol use of about 2.0 standard drinks of alcohol per week. He reports that he does not currently use drugs after having used the following drugs: Crack cocaine.  Allergies  Allergen Reactions   Tobacco Shortness Of Breath and Other (See Comments)    CIGARETTE SMOKE TRIGGERS THE PATIENT'S RESPIRATORY ISSUES!!    Family History  Problem Relation Age of Onset   Hypertension Mother     Breast cancer Sister    Hypertension Sister    Colon cancer Brother    Colon polyps Neg Hx    Esophageal cancer Neg Hx    Rectal cancer Neg Hx    Stomach cancer Neg Hx     Prior to Admission medications   Medication Sig Start Date End Date Taking? Authorizing Provider  albuterol  (VENTOLIN  HFA) 108 (90 Base) MCG/ACT inhaler Inhale 1-2 puffs into the lungs every 6 (six) hours as needed for wheezing or shortness of breath. 09/18/23   Theotis Peers M, PA-C  arformoterol  (BROVANA ) 15 MCG/2ML NEBU Inhale 2 mLs (15 mcg total) into the lungs 2 (two) times daily. 08/28/23   Vicci Barnie NOVAK, MD  atorvastatin  (LIPITOR) 10 MG tablet Take 1 tablet (10 mg total) by mouth daily. 07/24/23   Vicci Barnie NOVAK, MD  budesonide  (PULMICORT ) 0.25 MG/2ML nebulizer solution Take 2 mLs (0.25 mg total) by nebulization 2 (two) times daily. 08/28/23   Vicci Barnie NOVAK, MD  folic acid  (FOLVITE ) 1 MG tablet Take 1 tablet (1 mg total) by mouth daily. 08/26/23   Amin, Ankit C, MD  ipratropium-albuterol  (DUONEB) 0.5-2.5 (3) MG/3ML SOLN Take 3 mLs by nebulization every 6 (six) hours as needed. 08/26/23   Amin, Ankit C, MD  Multiple Vitamin (MULTIVITAMIN WITH MINERALS) TABS tablet Take 1 tablet by mouth daily. Patient  not taking: Reported on 08/28/2023 08/26/23   Caleen Burgess BROCKS, MD  pantoprazole  (PROTONIX ) 40 MG tablet Take 1 tablet (40 mg total) by mouth daily at 6 (six) AM. 07/24/23   Vicci Barnie NOVAK, MD  predniSONE  (DELTASONE ) 20 MG tablet Take 2 tablets (40 mg total) by mouth daily. 09/25/23   Neldon Hamp RAMAN, PA  sodium chloride  (OCEAN) 0.65 % SOLN nasal spray Place 1 spray into both nostrils as needed for congestion. 07/24/23   Vicci Barnie NOVAK, MD  thiamine  (VITAMIN B1) 100 MG tablet Take 1 tablet (100 mg total) by mouth daily. 07/24/23   Vicci Barnie NOVAK, MD  umeclidinium bromide  (INCRUSE ELLIPTA ) 62.5 MCG/ACT AEPB Inhale 1 puff into the lungs daily. 08/28/23   Vicci Barnie NOVAK, MD    Physical Exam: Vitals:   10/02/23 2007  10/02/23 2200 10/02/23 2315 10/03/23 0016  BP: (!) 154/95 136/86 118/85 (!) 139/101  Pulse: 70 73 72 73  Resp: 18 19 14 18   Temp: 97.8 F (36.6 C)   97.8 F (36.6 C)  TempSrc: Oral     SpO2: 96% 95% 96% 99%  Weight:      Height:        Constitutional: NAD, calm, comfortable Vitals:   10/02/23 2007 10/02/23 2200 10/02/23 2315 10/03/23 0016  BP: (!) 154/95 136/86 118/85 (!) 139/101  Pulse: 70 73 72 73  Resp: 18 19 14 18   Temp: 97.8 F (36.6 C)   97.8 F (36.6 C)  TempSrc: Oral     SpO2: 96% 95% 96% 99%  Weight:      Height:       Eyes: PERRL, lids and conjunctivae normal ENMT: Mucous membranes are moist. Posterior pharynx clear of any exudate or lesions.poor dentition.  Neck: normal, supple, no masses, no thyromegaly Respiratory: clear to auscultation bilaterally, no wheezing, no crackles. Normal respiratory effort. No accessory muscle use.  Cardiovascular: Regular rate and rhythm, no murmurs / rubs / gallops. No extremity edema. 2+ pedal pulses.  Abdomen: no tenderness, no masses palpated. No hepatosplenomegaly. Bowel sounds positive.  Musculoskeletal: no clubbing / cyanosis. No joint deformity upper and lower extremities.(Upper left ext with missing fingers) Good ROM, no contractures. Normal muscle tone.  Skin: no rashes, lesions, ulcers. No induration Neurologic: CN grossly intact. Sensation intact,  Strength 5/5 in all 4.  Psychiatric: Normal judgment and insight. Alert and oriented x 3. Normal mood.    Labs on Admission: I have personally reviewed following labs and imaging studies  CBC: Recent Labs  Lab 10/02/23 1627  WBC 6.8  HGB 14.0  HCT 42.5  MCV 99.8  PLT 239   Basic Metabolic Panel: Recent Labs  Lab 10/02/23 1627  NA 142  K 4.0  CL 105  CO2 22  GLUCOSE 87  BUN 13  CREATININE 1.09  CALCIUM  9.5   GFR: Estimated Creatinine Clearance: 49.1 mL/min (by C-G formula based on SCr of 1.09 mg/dL). Liver Function Tests: No results for input(s): AST,  ALT, ALKPHOS, BILITOT, PROT, ALBUMIN in the last 168 hours. No results for input(s): LIPASE, AMYLASE in the last 168 hours. No results for input(s): AMMONIA in the last 168 hours. Coagulation Profile: No results for input(s): INR, PROTIME in the last 168 hours. Cardiac Enzymes: No results for input(s): CKTOTAL, CKMB, CKMBINDEX, TROPONINI in the last 168 hours. BNP (last 3 results) No results for input(s): PROBNP in the last 8760 hours. HbA1C: No results for input(s): HGBA1C in the last 72 hours. CBG: No results for input(s):  GLUCAP in the last 168 hours. Lipid Profile: No results for input(s): CHOL, HDL, LDLCALC, TRIG, CHOLHDL, LDLDIRECT in the last 72 hours. Thyroid  Function Tests: No results for input(s): TSH, T4TOTAL, FREET4, T3FREE, THYROIDAB in the last 72 hours. Anemia Panel: No results for input(s): VITAMINB12, FOLATE, FERRITIN, TIBC, IRON, RETICCTPCT in the last 72 hours. Urine analysis:    Component Value Date/Time   COLORURINE YELLOW 05/13/2023 2334   APPEARANCEUR CLEAR 05/13/2023 2334   LABSPEC 1.020 05/13/2023 2334   PHURINE 5.0 05/13/2023 2334   GLUCOSEU 50 (A) 05/13/2023 2334   HGBUR SMALL (A) 05/13/2023 2334   BILIRUBINUR NEGATIVE 05/13/2023 2334   KETONESUR NEGATIVE 05/13/2023 2334   PROTEINUR NEGATIVE 05/13/2023 2334   NITRITE NEGATIVE 05/13/2023 2334   LEUKOCYTESUR NEGATIVE 05/13/2023 2334    Radiological Exams on Admission: DG Chest 2 View Result Date: 10/02/2023 CLINICAL DATA:  Shortness of breath. EXAM: CHEST - 2 VIEW COMPARISON:  09/24/2023 FINDINGS: AP and lateral views. AP is apical lordotic. Midline trachea. Normal heart size and mediastinal contours. No pleural effusion or pneumothorax. Clear lungs. IMPRESSION: No acute cardiopulmonary disease. Electronically Signed   By: Rockey Kilts M.D.   On: 10/02/2023 16:54    EKG: Independently reviewed.   Assessment/Plan Acute COPD  exacerbation  -admit to med tele - solumedrol iv bid taper to po prednisone  daily per protocol - doxycycline  -f/u on sputum cultures  -cxr : NAD - nebs standing and prn  -resume chronic inhalers  -pulmonary toilet  -echo to be complete ? SOB possibly cardiac etiology -f/u d-dimer ( of note recent ctpe negative but will ensure no current concern for PE)  Polysubstance abuse  (ETOH, Cocaine)  - UDS pending  - place on CIWA   Tobacco abuse -encourage cessation  -tobacco counseling   GERD -ppi   HTN -resume home regimen  -currently stable     DVT prophylaxis: heparin  Code Status: full/ as discussed per patient wishes in event of cardiac arrest  Family Communication: none at bedside Disposition Plan: patient  expected to be admitted greater than 2 midnights  Consults called: n/a Admission status: med tele   Camila DELENA Ned MD [facility]   If 7PM-7AM, please contact night-coverage [email or web address] Password [email or web address]  10/03/2023, 1:01 AM

## 2023-10-04 DIAGNOSIS — J441 Chronic obstructive pulmonary disease with (acute) exacerbation: Secondary | ICD-10-CM | POA: Diagnosis not present

## 2023-10-04 NOTE — Progress Notes (Signed)
 PROGRESS NOTE    Jason Wong  FMW:[id number] DOB: [birth year] DOA: 10/02/2023 PCP: Vicci Barnie NOVAK, MD    Brief Narrative:  68 year old gentleman with ongoing smoking, history of COPD, cocaine user, hypertension hyperlipidemia presented with about 3 days of progressive shortness of breath. In the emergency room afebrile. Blood pressure stable. On room air. RVP negative. Chest x-ray normal. Treated with nebulizers and steroids due to significant bronchospasm and wheezing and admitted to the hospital.   Subjective: Patient seen and examined.  At the time of my exam, he denies any complaints.  Patient tells me that he gets these episodes of shortness of breath and one happened today morning where he could not breathe.  Relieved with bronchodilator. Assessment & Plan:   COPD with acute exacerbation: Still with intermittent symptoms. Aggressive bronchodilator therapy, IV steroids-oral steroids taper, inhalational steroids, scheduled and as needed bronchodilators, deep breathing exercises, incentive spirometry, chest physiotherapy. Antibiotics due to severity of symptoms.  Doxycycline  for 7 days. Supplemental oxygen  to keep saturations more than 90%.  Walk in the hallway. Echocardiogram fairly normal.  Ejection fraction 55%.  Smoking, cocaine and alcohol cessation counseling done.  Placed on CIWA scale.  Nicotine  patch provided. So far uncomplicated.   DVT prophylaxis: heparin  injection 5,000 Units Start: 10/03/23 0600   Code Status: Full code Family Communication: None at the bedside Disposition Plan: Status is: Inpatient Remains inpatient appropriate because: Significant respiratory symptoms     Consultants:  None  Procedures:  None  Antimicrobials:  Doxycycline  7/14---     Objective: Vitals:   10/04/23 0005 10/04/23 0429 10/04/23 0747 10/04/23 0837  BP: (!) 135/93 130/84 128/82   Pulse: 62 71 64 67  Resp: 20 20 17 16   Temp: 98.4 F (36.9 C) 98 F (36.7 C) 98.1  F (36.7 C)   TempSrc:      SpO2: 97% 96% 98% 98%  Weight:      Height:        Intake/Output Summary (Last 24 hours) at 10/04/2023 1043 Last data filed at 10/04/2023 0543 Gross per 24 hour  Intake --  Output 200 ml  Net -200 ml   Filed Weights   10/02/23 1554  Weight: 53.5 kg    Examination:  General: Looks fairly comfortable.  On room air. Cardiovascular: S1-S2 normal.  Regular rate rhythm. Respiratory: Occasional expiratory wheezes.  On room air. Gastrointestinal: Soft.  Nontender.  Bowel sound present. Ext: No swelling or edema.  No cyanosis. Neuro: Alert awake oriented. Musculoskeletal: No deformities.     Data Reviewed: I have personally reviewed following labs and imaging studies  CBC: Recent Labs  Lab 10/02/23 1627 10/03/23 0458  WBC 6.8 4.7  HGB 14.0 14.1  HCT 42.5 42.2  MCV 99.8 99.1  PLT 239 260   Basic Metabolic Panel: Recent Labs  Lab 10/02/23 1627 10/03/23 0458  NA 142 137  K 4.0 4.8  CL 105 107  CO2 22 18*  GLUCOSE 87 171*  BUN 13 16  CREATININE 1.09 1.08  CALCIUM  9.5 9.2   GFR: Estimated Creatinine Clearance: 49.5 mL/min (by C-G formula based on SCr of 1.08 mg/dL). Liver Function Tests: Recent Labs  Lab 10/03/23 0458  AST 23  ALT 16  ALKPHOS 57  BILITOT 1.3*  PROT 6.5  ALBUMIN 3.7   No results for input(s): LIPASE, AMYLASE in the last 168 hours. No results for input(s): AMMONIA in the last 168 hours. Coagulation Profile: No results for input(s): INR, PROTIME in the last 168 hours. Cardiac  Enzymes: No results for input(s): CKTOTAL, CKMB, CKMBINDEX, TROPONINI in the last 168 hours. BNP (last 3 results) No results for input(s): PROBNP in the last 8760 hours. HbA1C: No results for input(s): HGBA1C in the last 72 hours. CBG: No results for input(s): GLUCAP in the last 168 hours. Lipid Profile: No results for input(s): CHOL, HDL, LDLCALC, TRIG, CHOLHDL, LDLDIRECT in the last 72  hours. Thyroid  Function Tests: No results for input(s): TSH, T4TOTAL, FREET4, T3FREE, THYROIDAB in the last 72 hours. Anemia Panel: No results for input(s): VITAMINB12, FOLATE, FERRITIN, TIBC, IRON, RETICCTPCT in the last 72 hours. Sepsis Labs: No results for input(s): PROCALCITON, LATICACIDVEN in the last 168 hours.  Recent Results (from the past 240 hours)  Resp panel by RT-PCR (RSV, Flu A&B, Covid) Anterior Nasal Swab     Status: None   Collection Time: 09/25/23  6:58 AM   Specimen: Anterior Nasal Swab  Result Value Ref Range Status   SARS Coronavirus 2 by RT PCR NEGATIVE NEGATIVE Final   Influenza A by PCR NEGATIVE NEGATIVE Final   Influenza B by PCR NEGATIVE NEGATIVE Final    Comment: (NOTE) The Xpert Xpress SARS-CoV-2/FLU/RSV plus assay is intended as an aid in the diagnosis of influenza from Nasopharyngeal swab specimens and should not be used as a sole basis for treatment. Nasal washings and aspirates are unacceptable for Xpert Xpress SARS-CoV-2/FLU/RSV testing.  Fact Sheet for Patients: BloggerCourse.com  Fact Sheet for Healthcare Providers: SeriousBroker.it  This test is not yet approved or cleared by the [place]  FDA and has been authorized for detection and/or diagnosis of SARS-CoV-2 by FDA under an Emergency Use Authorization (EUA). This EUA will remain in effect (meaning this test can be used) for the duration of the COVID-19 declaration under Section 564(b)(1) of the Act, 21 U.S.C. section 360bbb-3(b)(1), unless the authorization is terminated or revoked.     Resp Syncytial Virus by PCR NEGATIVE NEGATIVE Final    Comment: (NOTE) Fact Sheet for Patients: BloggerCourse.com  Fact Sheet for Healthcare Providers: SeriousBroker.it  This test is not yet approved or cleared by the [place]  FDA and has been authorized for detection  and/or diagnosis of SARS-CoV-2 by FDA under an Emergency Use Authorization (EUA). This EUA will remain in effect (meaning this test can be used) for the duration of the COVID-19 declaration under Section 564(b)(1) of the Act, 21 U.S.C. section 360bbb-3(b)(1), unless the authorization is terminated or revoked.  Performed at [facility], [address] [place], [place] [address]   Resp panel by RT-PCR (RSV, Flu A&B, Covid) Anterior Nasal Swab     Status: None   Collection Time: 10/02/23  4:26 PM   Specimen: Anterior Nasal Swab  Result Value Ref Range Status   SARS Coronavirus 2 by RT PCR NEGATIVE NEGATIVE Final   Influenza A by PCR NEGATIVE NEGATIVE Final   Influenza B by PCR NEGATIVE NEGATIVE Final    Comment: (NOTE) The Xpert Xpress SARS-CoV-2/FLU/RSV plus assay is intended as an aid in the diagnosis of influenza from Nasopharyngeal swab specimens and should not be used as a sole basis for treatment. Nasal washings and aspirates are unacceptable for Xpert Xpress SARS-CoV-2/FLU/RSV testing.  Fact Sheet for Patients: BloggerCourse.com  Fact Sheet for Healthcare Providers: SeriousBroker.it  This test is not yet approved or cleared by the [place]  FDA and has been authorized for detection and/or diagnosis of SARS-CoV-2 by FDA under an Emergency Use Authorization (EUA). This EUA will remain in effect (meaning this test can  be used) for the duration of the COVID-19 declaration under Section 564(b)(1) of the Act, 21 U.S.C. section 360bbb-3(b)(1), unless the authorization is terminated or revoked.     Resp Syncytial Virus by PCR NEGATIVE NEGATIVE Final    Comment: (NOTE) Fact Sheet for Patients: BloggerCourse.com  Fact Sheet for Healthcare Providers: SeriousBroker.it  This test is not yet approved or cleared by the [place]  FDA and has been authorized for  detection and/or diagnosis of SARS-CoV-2 by FDA under an Emergency Use Authorization (EUA). This EUA will remain in effect (meaning this test can be used) for the duration of the COVID-19 declaration under Section 564(b)(1) of the Act, 21 U.S.C. section 360bbb-3(b)(1), unless the authorization is terminated or revoked.  Performed at [facility], [address], [place], [place] [address]   Respiratory (~20 pathogens) panel by PCR     Status: None   Collection Time: 10/02/23  4:26 PM   Specimen: Nasopharyngeal Swab; Respiratory  Result Value Ref Range Status   Adenovirus NOT DETECTED NOT DETECTED Final   Coronavirus 229E NOT DETECTED NOT DETECTED Final    Comment: (NOTE) The Coronavirus on the Respiratory Panel, DOES NOT test for the novel  Coronavirus (2019 nCoV)    Coronavirus HKU1 NOT DETECTED NOT DETECTED Final   Coronavirus NL63 NOT DETECTED NOT DETECTED Final   Coronavirus OC43 NOT DETECTED NOT DETECTED Final   Metapneumovirus NOT DETECTED NOT DETECTED Final   Rhinovirus / Enterovirus NOT DETECTED NOT DETECTED Final   Influenza A NOT DETECTED NOT DETECTED Final   Influenza B NOT DETECTED NOT DETECTED Final   Parainfluenza Virus 1 NOT DETECTED NOT DETECTED Final   Parainfluenza Virus 2 NOT DETECTED NOT DETECTED Final   Parainfluenza Virus 3 NOT DETECTED NOT DETECTED Final   Parainfluenza Virus 4 NOT DETECTED NOT DETECTED Final   Respiratory Syncytial Virus NOT DETECTED NOT DETECTED Final   Bordetella pertussis NOT DETECTED NOT DETECTED Final   Bordetella Parapertussis NOT DETECTED NOT DETECTED Final   Chlamydophila pneumoniae NOT DETECTED NOT DETECTED Final   Mycoplasma pneumoniae NOT DETECTED NOT DETECTED Final    Comment: Performed at [facility], [address] [place], [place] [address]         Radiology Studies: ECHOCARDIOGRAM COMPLETE Result Date: 10/03/2023    ECHOCARDIOGRAM REPORT   Patient Name:   Jason Wong Date of Exam: 10/03/2023 Medical  Rec #:  [id number]    Height:       67.0 in Accession #:    [id number]   Weight:       118.0 lb Date of Birth:  [birth year]    BSA:          1.616 m Patient Age:    68 years     BP:           130/93 mmHg Patient Gender: M            HR:           68 bpm. Exam Location:  Inpatient Procedure: 2D Echo, Cardiac Doppler and Color Doppler (Both Spectral and Color            Flow Doppler were utilized during procedure). Indications:    Dyspnea  History:        Patient has prior history of Echocardiogram examinations. Risk                 Factors:Hypertension and Diabetes.  Sonographer:    Christiana Mbomeh Referring Phys: [id number]  A THOMAS IMPRESSIONS  1. Left ventricular ejection fraction, by estimation, is 55 to 60%. The left ventricle has normal function. The left ventricle has no regional wall motion abnormalities. Left ventricular diastolic parameters were normal.  2. Right ventricular systolic function is normal. The right ventricular size is normal. There is normal pulmonary artery systolic pressure. The estimated right ventricular systolic pressure is 22.7 mmHg.  3. The mitral valve is normal in structure. No evidence of mitral valve regurgitation. No evidence of mitral stenosis.  4. The aortic valve is tricuspid. There is mild calcification of the aortic valve. There is mild thickening of the aortic valve. Aortic valve regurgitation is not visualized. Aortic valve sclerosis/calcification is present, without any evidence of aortic stenosis.  5. The inferior vena cava is normal in size with greater than 50% respiratory variability, suggesting right atrial pressure of 3 mmHg. Comparison(s): No significant change from prior study. Prior images reviewed side by side. FINDINGS  Left Ventricle: Left ventricular ejection fraction, by estimation, is 55 to 60%. The left ventricle has normal function. The left ventricle has no regional wall motion abnormalities. The left ventricular internal cavity size was normal in  size. There is  no left ventricular hypertrophy. Left ventricular diastolic parameters were normal. Normal left ventricular filling pressure. Right Ventricle: The right ventricular size is normal. Right vetricular wall thickness was not well visualized. Right ventricular systolic function is normal. There is normal pulmonary artery systolic pressure. The tricuspid regurgitant velocity is 2.22 m/s, and with an assumed right atrial pressure of 3 mmHg, the estimated right ventricular systolic pressure is 22.7 mmHg. Left Atrium: Left atrial size was normal in size. Right Atrium: Right atrial size was normal in size. Pericardium: There is no evidence of pericardial effusion. Mitral Valve: The mitral valve is normal in structure. Mild mitral annular calcification. No evidence of mitral valve regurgitation. No evidence of mitral valve stenosis. Tricuspid Valve: The tricuspid valve is not well visualized. Tricuspid valve regurgitation is trivial. Aortic Valve: The aortic valve is tricuspid. There is mild calcification of the aortic valve. There is mild thickening of the aortic valve. Aortic valve regurgitation is not visualized. Aortic valve sclerosis/calcification is present, without any evidence of aortic stenosis. Aortic valve mean gradient measures 2.0 mmHg. Aortic valve peak gradient measures 4.9 mmHg. Aortic valve area, by VTI measures 2.98 cm. Pulmonic Valve: The pulmonic valve was not well visualized. Aorta: The aortic root and ascending aorta are structurally normal, with no evidence of dilitation. Venous: The inferior vena cava is normal in size with greater than 50% respiratory variability, suggesting right atrial pressure of 3 mmHg. IAS/Shunts: No atrial level shunt detected by color flow Doppler.  LEFT VENTRICLE PLAX 2D LVIDd:         4.30 cm   Diastology LVIDs:         2.70 cm   LV e' medial:    9.03 cm/s LV PW:         0.90 cm   LV E/e' medial:  7.2 LV IVS:        1.00 cm   LV e' lateral:   8.05 cm/s LVOT  diam:     2.10 cm   LV E/e' lateral: 8.1 LV SV:         67 LV SV Index:   41 LVOT Area:     3.46 cm  RIGHT VENTRICLE RV S prime:     13.90 cm/s TAPSE (M-mode): 2.3 cm LEFT ATRIUM  Index        RIGHT ATRIUM           Index LA Vol (A2C):   20.7 ml 12.81 ml/m  RA Area:     10.60 cm LA Vol (A4C):   16.3 ml 10.09 ml/m  RA Volume:   21.70 ml  13.43 ml/m LA Biplane Vol: 20.0 ml 12.38 ml/m  AORTIC VALVE AV Area (Vmax):    2.79 cm AV Area (Vmean):   2.89 cm AV Area (VTI):     2.98 cm AV Vmax:           111.00 cm/s AV Vmean:          69.800 cm/s AV VTI:            0.223 m AV Peak Grad:      4.9 mmHg AV Mean Grad:      2.0 mmHg LVOT Vmax:         89.40 cm/s LVOT Vmean:        58.300 cm/s LVOT VTI:          0.192 m LVOT/AV VTI ratio: 0.86  AORTA Ao Root diam: 3.10 cm Ao Asc diam:  3.70 cm MITRAL VALVE               TRICUSPID VALVE MV Area (PHT): 3.79 cm    TR Peak grad:   19.7 mmHg MV Decel Time: 200 msec    TR Vmax:        222.00 cm/s MV E velocity: 65.10 cm/s MV A velocity: 69.40 cm/s  SHUNTS MV E/A ratio:  0.94        Systemic VTI:  0.19 m                            Systemic Diam: 2.10 cm Jerel Croitoru MD Electronically signed by Jerel Balding MD Signature Date/Time: 10/03/2023/4:20:52 PM    Final    DG Chest 2 View Result Date: 10/02/2023 CLINICAL DATA:  Shortness of breath. EXAM: CHEST - 2 VIEW COMPARISON:  09/24/2023 FINDINGS: AP and lateral views. AP is apical lordotic. Midline trachea. Normal heart size and mediastinal contours. No pleural effusion or pneumothorax. Clear lungs. IMPRESSION: No acute cardiopulmonary disease. Electronically Signed   By: Rockey Kilts M.D.   On: 10/02/2023 16:54        Scheduled Meds:  budesonide   0.25 mg Nebulization BID   doxycycline   100 mg Oral Q12H   folic acid   1 mg Oral Daily   heparin   5,000 Units Subcutaneous Q8H   multivitamin with minerals  1 tablet Oral Daily   pantoprazole   40 mg Oral Daily   predniSONE   40 mg Oral Q breakfast   thiamine    100 mg Oral Daily   umeclidinium bromide   1 puff Inhalation Daily   Continuous Infusions:   LOS: 1 day    Time spent: 40 minutes    Renato Applebaum, MD [facility]

## 2023-10-04 NOTE — Plan of Care (Signed)
   Problem: Education: Goal: Knowledge of General Education information will improve Description Including pain rating scale, medication(s)/side effects and non-pharmacologic comfort measures Outcome: Progressing

## 2023-10-04 NOTE — Plan of Care (Signed)
  Problem: Education: Goal: Knowledge of General Education information will improve Description: Including pain rating scale, medication(s)/side effects and non-pharmacologic comfort measures Outcome: Progressing   Problem: Activity: Goal: Risk for activity intolerance will decrease Outcome: Progressing   Problem: Elimination: Goal: Will not experience complications related to bowel motility Outcome: Progressing Goal: Will not experience complications related to urinary retention Outcome: Progressing   Problem: Coping: Goal: Level of anxiety will decrease Outcome: Progressing   Problem: Safety: Goal: Ability to remain free from injury will improve Outcome: Progressing

## 2023-10-05 ENCOUNTER — Other Ambulatory Visit (HOSPITAL_COMMUNITY): Payer: Self-pay

## 2023-10-05 DIAGNOSIS — J441 Chronic obstructive pulmonary disease with (acute) exacerbation: Secondary | ICD-10-CM | POA: Diagnosis not present

## 2023-10-05 MED ORDER — PREDNISONE 20 MG PO TABS
40.0000 mg | ORAL_TABLET | Freq: Every day | ORAL | 0 refills | Status: DC
  Filled 2023-10-05: qty 8, 4d supply, fill #0

## 2023-10-05 MED ORDER — DOXYCYCLINE HYCLATE 100 MG PO TABS
100.0000 mg | ORAL_TABLET | Freq: Two times a day (BID) | ORAL | 0 refills | Status: AC
  Filled 2023-10-05: qty 10, 5d supply, fill #0

## 2023-10-05 MED ORDER — IPRATROPIUM-ALBUTEROL 0.5-2.5 (3) MG/3ML IN SOLN
3.0000 mL | Freq: Four times a day (QID) | RESPIRATORY_TRACT | 0 refills | Status: DC | PRN
  Filled 2023-10-05: qty 360, 30d supply, fill #0

## 2023-10-05 MED ORDER — ATORVASTATIN CALCIUM 10 MG PO TABS
10.0000 mg | ORAL_TABLET | Freq: Every day | ORAL | 1 refills | Status: DC
  Filled 2023-10-05: qty 90, 90d supply, fill #0

## 2023-10-05 MED ORDER — BUDESONIDE 0.25 MG/2ML IN SUSP
0.2500 mg | Freq: Two times a day (BID) | RESPIRATORY_TRACT | 1 refills | Status: DC
  Filled 2023-10-05: qty 120, 30d supply, fill #0

## 2023-10-05 MED ORDER — UMECLIDINIUM BROMIDE 62.5 MCG/ACT IN AEPB
1.0000 | INHALATION_SPRAY | Freq: Every day | RESPIRATORY_TRACT | 6 refills | Status: DC
  Filled 2023-10-05: qty 30, 30d supply, fill #0

## 2023-10-05 NOTE — Plan of Care (Signed)

## 2023-10-05 NOTE — Plan of Care (Signed)
?  Problem: Education: ?Goal: Knowledge of General Education information will improve ?Description: Including pain rating scale, medication(s)/side effects and non-pharmacologic comfort measures ?Outcome: Progressing ?  ?Problem: Activity: ?Goal: Risk for activity intolerance will decrease ?Outcome: Progressing ?  ?Problem: Elimination: ?Goal: Will not experience complications related to bowel motility ?Outcome: Progressing ?Goal: Will not experience complications related to urinary retention ?Outcome: Progressing ?  ?Problem: Safety: ?Goal: Ability to remain free from injury will improve ?Outcome: Progressing ?  ?Problem: Skin Integrity: ?Goal: Risk for impaired skin integrity will decrease ?Outcome: Progressing ?  ?

## 2023-10-05 NOTE — Discharge Summary (Signed)
 Physician Discharge Summary  Jason Wong FMW:[id number] DOB: 10/22/1955 DOA: 10/02/2023  PCP: Vicci Barnie NOVAK, MD  Admit date: 10/02/2023 Discharge date: 10/05/2023  Admitted From: Home Disposition: Home  Recommendations for Outpatient Follow-up:  Follow up with PCP in 1-2 weeks Consistently take medications.  Home Health: N/A Equipment/Devices: N/A  Discharge Condition: Stable CODE STATUS: Full code Diet recommendation: Low-salt diet  Discharge summary: 68 year old gentleman with ongoing smoking, history of COPD, cocaine user, hypertension hyperlipidemia presented with about 3 days of progressive shortness of breath. In the emergency room afebrile. Blood pressure stable. On room air. RVP negative. Chest x-ray normal. Treated with nebulizers and steroids due to significant bronchospasm and wheezing and admitted to the hospital.   # COPD with acute exacerbation: Ongoing smoking and cocaine use.  Not using bronchodilators and other steroid inhalers at home. Admitted and treated with bronchodilator therapy, IV steroids and subsequent oral steroids.  Restarted on LABA, steroid nebulizer.  Treated with doxycycline .  Clinically improved. Plan: Extensive counseling about smoking cessation and not using cocaine.  Patient is motivated to quit. Echocardiogram with ejection fraction 55%. Patient will be going home with Arformoterol , Pulmicort , Incruse.  Albuterol  and rescue inhalers. Doxycycline  to complete 7 days of therapy, 5 additional days. Prednisone  for 5 additional days.   Smoking, cocaine and alcohol cessation counseling done.  Did not show any evidence of complications.  Stable for discharge.  Discharge Diagnoses:  Principal Problem:   COPD exacerbation [facility])    Discharge Instructions  Discharge Instructions     Diet - low sodium heart healthy   Complete by: As directed    Increase activity slowly   Complete by: As directed       Allergies as of 10/05/2023        Reactions   Tobacco Shortness Of Breath, Other (See Comments)   CIGARETTE SMOKE TRIGGERS THE PATIENT'S RESPIRATORY ISSUES!!        Medication List     TAKE these medications    albuterol  108 (90 Base) MCG/ACT inhaler Commonly known as: VENTOLIN  HFA Inhale 1-2 puffs into the lungs every 6 (six) hours as needed for wheezing or shortness of breath.   arformoterol  15 MCG/2ML Nebu Commonly known as: Brovana  Inhale 2 mLs (15 mcg total) into the lungs 2 (two) times daily.   atorvastatin  10 MG tablet Commonly known as: LIPITOR Take 1 tablet (10 mg total) by mouth daily.   budesonide  0.25 MG/2ML nebulizer solution Commonly known as: PULMICORT  Take 2 mLs (0.25 mg total) by nebulization 2 (two) times daily.   doxycycline  100 MG tablet Commonly known as: VIBRA -TABS Take 1 tablet (100 mg total) by mouth every 12 (twelve) hours for 5 days.   folic acid  1 MG tablet Commonly known as: FOLVITE  Take 1 tablet (1 mg total) by mouth daily.   ipratropium-albuterol  0.5-2.5 (3) MG/3ML Soln Commonly known as: DUONEB Take 3 mLs by nebulization every 6 (six) hours as needed (shortness of breath , wheezing). What changed: reasons to take this   pantoprazole  40 MG tablet Commonly known as: PROTONIX  Take 1 tablet (40 mg total) by mouth daily at 6 (six) AM.   predniSONE  20 MG tablet Commonly known as: DELTASONE  Take 2 tablets (40 mg total) by mouth daily.   sodium chloride  0.65 % Soln nasal spray Commonly known as: OCEAN Place 1 spray into both nostrils as needed for congestion.   thiamine  100 MG tablet Commonly known as: VITAMIN B1 Take 1 tablet (100 mg total) by mouth daily.  umeclidinium bromide  62.5 MCG/ACT Aepb Commonly known as: INCRUSE ELLIPTA  Inhale 1 puff into the lungs daily.        Allergies  Allergen Reactions   Tobacco Shortness Of Breath and Other (See Comments)    CIGARETTE SMOKE TRIGGERS THE PATIENT'S RESPIRATORY ISSUES!!     Consultations: None   Procedures/Studies: ECHOCARDIOGRAM COMPLETE Result Date: 10/03/2023    ECHOCARDIOGRAM REPORT   Patient Name:   Jason Wong Date of Exam: 10/03/2023 Medical Rec #:  [id number]    Height:       67.0 in Accession #:    [id number]   Weight:       118.0 lb Date of Birth:  Jun 26, 1955    BSA:          1.616 m Patient Age:    68 years     BP:           130/93 mmHg Patient Gender: M            HR:           68 bpm. Exam Location:  Inpatient Procedure: 2D Echo, Cardiac Doppler and Color Doppler (Both Spectral and Color            Flow Doppler were utilized during procedure). Indications:    Dyspnea  History:        Patient has prior history of Echocardiogram examinations. Risk                 Factors:Hypertension and Diabetes.  Sonographer:    Christiana Mbomeh Referring Phys: [id number] SARA-MAIZ A THOMAS IMPRESSIONS  1. Left ventricular ejection fraction, by estimation, is 55 to 60%. The left ventricle has normal function. The left ventricle has no regional wall motion abnormalities. Left ventricular diastolic parameters were normal.  2. Right ventricular systolic function is normal. The right ventricular size is normal. There is normal pulmonary artery systolic pressure. The estimated right ventricular systolic pressure is 22.7 mmHg.  3. The mitral valve is normal in structure. No evidence of mitral valve regurgitation. No evidence of mitral stenosis.  4. The aortic valve is tricuspid. There is mild calcification of the aortic valve. There is mild thickening of the aortic valve. Aortic valve regurgitation is not visualized. Aortic valve sclerosis/calcification is present, without any evidence of aortic stenosis.  5. The inferior vena cava is normal in size with greater than 50% respiratory variability, suggesting right atrial pressure of 3 mmHg. Comparison(s): No significant change from prior study. Prior images reviewed side by side. FINDINGS  Left Ventricle: Left ventricular ejection  fraction, by estimation, is 55 to 60%. The left ventricle has normal function. The left ventricle has no regional wall motion abnormalities. The left ventricular internal cavity size was normal in size. There is  no left ventricular hypertrophy. Left ventricular diastolic parameters were normal. Normal left ventricular filling pressure. Right Ventricle: The right ventricular size is normal. Right vetricular wall thickness was not well visualized. Right ventricular systolic function is normal. There is normal pulmonary artery systolic pressure. The tricuspid regurgitant velocity is 2.22 m/s, and with an assumed right atrial pressure of 3 mmHg, the estimated right ventricular systolic pressure is 22.7 mmHg. Left Atrium: Left atrial size was normal in size. Right Atrium: Right atrial size was normal in size. Pericardium: There is no evidence of pericardial effusion. Mitral Valve: The mitral valve is normal in structure. Mild mitral annular calcification. No evidence of mitral valve regurgitation. No evidence of mitral valve stenosis. Tricuspid Valve: The tricuspid valve  is not well visualized. Tricuspid valve regurgitation is trivial. Aortic Valve: The aortic valve is tricuspid. There is mild calcification of the aortic valve. There is mild thickening of the aortic valve. Aortic valve regurgitation is not visualized. Aortic valve sclerosis/calcification is present, without any evidence of aortic stenosis. Aortic valve mean gradient measures 2.0 mmHg. Aortic valve peak gradient measures 4.9 mmHg. Aortic valve area, by VTI measures 2.98 cm. Pulmonic Valve: The pulmonic valve was not well visualized. Aorta: The aortic root and ascending aorta are structurally normal, with no evidence of dilitation. Venous: The inferior vena cava is normal in size with greater than 50% respiratory variability, suggesting right atrial pressure of 3 mmHg. IAS/Shunts: No atrial level shunt detected by color flow Doppler.  LEFT VENTRICLE PLAX  2D LVIDd:         4.30 cm   Diastology LVIDs:         2.70 cm   LV e' medial:    9.03 cm/s LV PW:         0.90 cm   LV E/e' medial:  7.2 LV IVS:        1.00 cm   LV e' lateral:   8.05 cm/s LVOT diam:     2.10 cm   LV E/e' lateral: 8.1 LV SV:         67 LV SV Index:   41 LVOT Area:     3.46 cm  RIGHT VENTRICLE RV S prime:     13.90 cm/s TAPSE (M-mode): 2.3 cm LEFT ATRIUM             Index        RIGHT ATRIUM           Index LA Vol (A2C):   20.7 ml 12.81 ml/m  RA Area:     10.60 cm LA Vol (A4C):   16.3 ml 10.09 ml/m  RA Volume:   21.70 ml  13.43 ml/m LA Biplane Vol: 20.0 ml 12.38 ml/m  AORTIC VALVE AV Area (Vmax):    2.79 cm AV Area (Vmean):   2.89 cm AV Area (VTI):     2.98 cm AV Vmax:           111.00 cm/s AV Vmean:          69.800 cm/s AV VTI:            0.223 m AV Peak Grad:      4.9 mmHg AV Mean Grad:      2.0 mmHg LVOT Vmax:         89.40 cm/s LVOT Vmean:        58.300 cm/s LVOT VTI:          0.192 m LVOT/AV VTI ratio: 0.86  AORTA Ao Root diam: 3.10 cm Ao Asc diam:  3.70 cm MITRAL VALVE               TRICUSPID VALVE MV Area (PHT): 3.79 cm    TR Peak grad:   19.7 mmHg MV Decel Time: 200 msec    TR Vmax:        222.00 cm/s MV E velocity: 65.10 cm/s MV A velocity: 69.40 cm/s  SHUNTS MV E/A ratio:  0.94        Systemic VTI:  0.19 m                            Systemic Diam: 2.10 cm Jerel Balding MD Electronically signed  by Jerel Balding MD Signature Date/Time: 10/03/2023/4:20:52 PM    Final    DG Chest 2 View Result Date: 10/02/2023 CLINICAL DATA:  Shortness of breath. EXAM: CHEST - 2 VIEW COMPARISON:  09/24/2023 FINDINGS: AP and lateral views. AP is apical lordotic. Midline trachea. Normal heart size and mediastinal contours. No pleural effusion or pneumothorax. Clear lungs. IMPRESSION: No acute cardiopulmonary disease. Electronically Signed   By: Rockey Kilts M.D.   On: 10/02/2023 16:54   DG Chest 2 View Result Date: 09/24/2023 CLINICAL DATA:  Short of breath EXAM: CHEST - 2 VIEW COMPARISON:   09/18/2023 FINDINGS: Frontal and lateral views of the chest demonstrate a stable cardiac silhouette. No acute airspace disease, effusion, or pneumothorax. No acute bony abnormalities. IMPRESSION: 1. No acute intrathoracic process. Electronically Signed   By: Ozell Daring M.D.   On: 09/24/2023 10:30   DG Chest 2 View Result Date: 09/18/2023 CLINICAL DATA:  Shortness of breath. EXAM: CHEST - 2 VIEW COMPARISON:  09/11/2023 and CT chest 09/11/2023. FINDINGS: Trachea is midline. Heart size normal. Lungs are hyperinflated but clear. No pleural fluid. Previously questioned pulmonary nodule in the right costophrenic angle did not have a CT correlate on same day CT chest 09/11/2023. IMPRESSION: Hyperinflation without acute finding. Electronically Signed   By: Newell Eke M.D.   On: 09/18/2023 11:28   CT Angio Chest PE W/Cm &/Or Wo Cm Result Date: 09/11/2023 CLINICAL DATA:  Shortness of breath. Concern for pulmonary embolism. EXAM: CT ANGIOGRAPHY CHEST WITH CONTRAST TECHNIQUE: Multidetector CT imaging of the chest was performed using the standard protocol during bolus administration of intravenous contrast. Multiplanar CT image reconstructions and MIPs were obtained to evaluate the vascular anatomy. RADIATION DOSE REDUCTION: This exam was performed according to the departmental dose-optimization program which includes automated exposure control, adjustment of the mA and/or kV according to patient size and/or use of iterative reconstruction technique. CONTRAST:  28mL OMNIPAQUE  IOHEXOL  350 MG/ML SOLN COMPARISON:  Chest CT dated 06/19/2022. FINDINGS: Cardiovascular: There is no cardiomegaly or pericardial effusion. There is mild atherosclerotic calcification of the thoracic aorta. No aneurysmal dilatation. No pulmonary embolus identified. Mediastinum/Nodes: No hilar or mediastinal adenopathy. The esophagus is grossly unremarkable. No mediastinal fluid collection. Lungs/Pleura: Background of emphysema. No focal  consolidation, pleural effusion, or pneumothorax. The central airways are patent. Upper Abdomen: No acute abnormality. Musculoskeletal: No chest wall abnormality. No acute or significant osseous findings. Review of the MIP images confirms the above findings. IMPRESSION: 1. No acute intrathoracic pathology. No CT evidence of pulmonary embolism. 2. Aortic Atherosclerosis (ICD10-I70.0) and Emphysema (ICD10-J43.9). Electronically Signed   By: Vanetta Chou M.D.   On: 09/11/2023 14:46   DG Chest 2 View Result Date: 09/11/2023 CLINICAL DATA:  Shortness of breath.  COPD. EXAM: CHEST - 2 VIEW COMPARISON:  09/01/2023 FINDINGS: Hyperinflation. Remote fifth posterolateral left rib fracture. Midline trachea. Normal heart size and mediastinal contours. No pleural effusion or pneumothorax. Possible 1.0 cm nodular density projecting over the right lower lobe laterally. Clear left lung. IMPRESSION: Hyperinflation, suggesting COPD.  No superimposed acute process. Possible 1.0 cm right lower lobe pulmonary nodule. Consider further evaluation with chest CT. Electronically Signed   By: Rockey Kilts M.D.   On: 09/11/2023 11:19   (Echo, Carotid, EGD, Colonoscopy, ERCP)    Subjective: Patient seen and examined in the morning rounds.  Denies any complaints at rest.  With mobility, he occasionally gets short of breath and bronchospasm.  Remains on room air. Comfortable with plan to go home.  All  new prescriptions were provided.  Discharge Exam: Vitals:   10/05/23 0027 10/05/23 0416  BP: (!) 142/95 (!) 136/96  Pulse: (!) 58 (!) 56  Resp: 20 20  Temp: 98.4 F (36.9 C) 98.1 F (36.7 C)  SpO2: 98% 98%   Vitals:   10/04/23 2035 10/04/23 2138 10/05/23 0027 10/05/23 0416  BP: (!) 132/92  (!) 142/95 (!) 136/96  Pulse: 66  (!) 58 (!) 56  Resp: 20  20 20   Temp: 98.1 F (36.7 C)  98.4 F (36.9 C) 98.1 F (36.7 C)  TempSrc:    Oral  SpO2: 100% 100% 98% 98%  Weight:      Height:        General: Pt is alert, awake,  not in acute distress Cardiovascular: RRR, S1/S2 +, no rubs, no gallops Respiratory: CTA bilaterally, no wheezing, no rhonchi, no added sounds. Abdominal: Soft, NT, ND, bowel sounds + Extremities: no edema, no cyanosis    The results of significant diagnostics from this hospitalization (including imaging, microbiology, ancillary and laboratory) are listed below for reference.     Microbiology: Recent Results (from the past 240 hours)  Resp panel by RT-PCR (RSV, Flu A&B, Covid) Anterior Nasal Swab     Status: None   Collection Time: 10/02/23  4:26 PM   Specimen: Anterior Nasal Swab  Result Value Ref Range Status   SARS Coronavirus 2 by RT PCR NEGATIVE NEGATIVE Final   Influenza A by PCR NEGATIVE NEGATIVE Final   Influenza B by PCR NEGATIVE NEGATIVE Final    Comment: (NOTE) The Xpert Xpress SARS-CoV-2/FLU/RSV plus assay is intended as an aid in the diagnosis of influenza from Nasopharyngeal swab specimens and should not be used as a sole basis for treatment. Nasal washings and aspirates are unacceptable for Xpert Xpress SARS-CoV-2/FLU/RSV testing.  Fact Sheet for Patients: BloggerCourse.com  Fact Sheet for Healthcare Providers: SeriousBroker.it  This test is not yet approved or cleared by the [place]  FDA and has been authorized for detection and/or diagnosis of SARS-CoV-2 by FDA under an Emergency Use Authorization (EUA). This EUA will remain in effect (meaning this test can be used) for the duration of the COVID-19 declaration under Section 564(b)(1) of the Act, 21 U.S.C. section 360bbb-3(b)(1), unless the authorization is terminated or revoked.     Resp Syncytial Virus by PCR NEGATIVE NEGATIVE Final    Comment: (NOTE) Fact Sheet for Patients: BloggerCourse.com  Fact Sheet for Healthcare Providers: SeriousBroker.it  This test is not yet approved or cleared by the  [place]  FDA and has been authorized for detection and/or diagnosis of SARS-CoV-2 by FDA under an Emergency Use Authorization (EUA). This EUA will remain in effect (meaning this test can be used) for the duration of the COVID-19 declaration under Section 564(b)(1) of the Act, 21 U.S.C. section 360bbb-3(b)(1), unless the authorization is terminated or revoked.  Performed at [facility], [address], [place], [place] [address]   Respiratory (~20 pathogens) panel by PCR     Status: None   Collection Time: 10/02/23  4:26 PM   Specimen: Nasopharyngeal Swab; Respiratory  Result Value Ref Range Status   Adenovirus NOT DETECTED NOT DETECTED Final   Coronavirus 229E NOT DETECTED NOT DETECTED Final    Comment: (NOTE) The Coronavirus on the Respiratory Panel, DOES NOT test for the novel  Coronavirus (2019 nCoV)    Coronavirus HKU1 NOT DETECTED NOT DETECTED Final   Coronavirus NL63 NOT DETECTED NOT DETECTED Final   Coronavirus OC43 NOT DETECTED  NOT DETECTED Final   Metapneumovirus NOT DETECTED NOT DETECTED Final   Rhinovirus / Enterovirus NOT DETECTED NOT DETECTED Final   Influenza A NOT DETECTED NOT DETECTED Final   Influenza B NOT DETECTED NOT DETECTED Final   Parainfluenza Virus 1 NOT DETECTED NOT DETECTED Final   Parainfluenza Virus 2 NOT DETECTED NOT DETECTED Final   Parainfluenza Virus 3 NOT DETECTED NOT DETECTED Final   Parainfluenza Virus 4 NOT DETECTED NOT DETECTED Final   Respiratory Syncytial Virus NOT DETECTED NOT DETECTED Final   Bordetella pertussis NOT DETECTED NOT DETECTED Final   Bordetella Parapertussis NOT DETECTED NOT DETECTED Final   Chlamydophila pneumoniae NOT DETECTED NOT DETECTED Final   Mycoplasma pneumoniae NOT DETECTED NOT DETECTED Final    Comment: Performed at [facility], [address], [place], [place] [address]     Labs: BNP (last 3 results) Recent Labs    02/17/23 1600 05/14/23 0205 08/25/23 0041  BNP 69.4 169.0* 46.1    Basic Metabolic Panel: Recent Labs  Lab 10/02/23 1627 10/03/23 0458  NA 142 137  K 4.0 4.8  CL 105 107  CO2 22 18*  GLUCOSE 87 171*  BUN 13 16  CREATININE 1.09 1.08  CALCIUM  9.5 9.2   Liver Function Tests: Recent Labs  Lab 10/03/23 0458  AST 23  ALT 16  ALKPHOS 57  BILITOT 1.3*  PROT 6.5  ALBUMIN 3.7   No results for input(s): LIPASE, AMYLASE in the last 168 hours. No results for input(s): AMMONIA in the last 168 hours. CBC: Recent Labs  Lab 10/02/23 1627 10/03/23 0458  WBC 6.8 4.7  HGB 14.0 14.1  HCT 42.5 42.2  MCV 99.8 99.1  PLT 239 260   Cardiac Enzymes: No results for input(s): CKTOTAL, CKMB, CKMBINDEX, TROPONINI in the last 168 hours. BNP: Invalid input(s): POCBNP CBG: No results for input(s): GLUCAP in the last 168 hours. D-Dimer No results for input(s): DDIMER in the last 72 hours. Hgb A1c No results for input(s): HGBA1C in the last 72 hours. Lipid Profile No results for input(s): CHOL, HDL, LDLCALC, TRIG, CHOLHDL, LDLDIRECT in the last 72 hours. Thyroid  function studies No results for input(s): TSH, T4TOTAL, T3FREE, THYROIDAB in the last 72 hours.  Invalid input(s): FREET3 Anemia work up No results for input(s): VITAMINB12, FOLATE, FERRITIN, TIBC, IRON, RETICCTPCT in the last 72 hours. Urinalysis    Component Value Date/Time   COLORURINE YELLOW 05/13/2023 2334   APPEARANCEUR CLEAR 05/13/2023 2334   LABSPEC 1.020 05/13/2023 2334   PHURINE 5.0 05/13/2023 2334   GLUCOSEU 50 (A) 05/13/2023 2334   HGBUR SMALL (A) 05/13/2023 2334   BILIRUBINUR NEGATIVE 05/13/2023 2334   KETONESUR NEGATIVE 05/13/2023 2334   PROTEINUR NEGATIVE 05/13/2023 2334   NITRITE NEGATIVE 05/13/2023 2334   LEUKOCYTESUR NEGATIVE 05/13/2023 2334   Sepsis Labs Recent Labs  Lab 10/02/23 1627 10/03/23 0458  WBC 6.8 4.7   Microbiology Recent Results (from the past 240 hours)  Resp panel by RT-PCR (RSV, Flu A&B,  Covid) Anterior Nasal Swab     Status: None   Collection Time: 10/02/23  4:26 PM   Specimen: Anterior Nasal Swab  Result Value Ref Range Status   SARS Coronavirus 2 by RT PCR NEGATIVE NEGATIVE Final   Influenza A by PCR NEGATIVE NEGATIVE Final   Influenza B by PCR NEGATIVE NEGATIVE Final    Comment: (NOTE) The Xpert Xpress SARS-CoV-2/FLU/RSV plus assay is intended as an aid in the diagnosis of influenza from Nasopharyngeal swab specimens and should  not be used as a sole basis for treatment. Nasal washings and aspirates are unacceptable for Xpert Xpress SARS-CoV-2/FLU/RSV testing.  Fact Sheet for Patients: BloggerCourse.com  Fact Sheet for Healthcare Providers: SeriousBroker.it  This test is not yet approved or cleared by the [place]  FDA and has been authorized for detection and/or diagnosis of SARS-CoV-2 by FDA under an Emergency Use Authorization (EUA). This EUA will remain in effect (meaning this test can be used) for the duration of the COVID-19 declaration under Section 564(b)(1) of the Act, 21 U.S.C. section 360bbb-3(b)(1), unless the authorization is terminated or revoked.     Resp Syncytial Virus by PCR NEGATIVE NEGATIVE Final    Comment: (NOTE) Fact Sheet for Patients: BloggerCourse.com  Fact Sheet for Healthcare Providers: SeriousBroker.it  This test is not yet approved or cleared by the [place]  FDA and has been authorized for detection and/or diagnosis of SARS-CoV-2 by FDA under an Emergency Use Authorization (EUA). This EUA will remain in effect (meaning this test can be used) for the duration of the COVID-19 declaration under Section 564(b)(1) of the Act, 21 U.S.C. section 360bbb-3(b)(1), unless the authorization is terminated or revoked.  Performed at [facility], [address], [place], [place] [address]   Respiratory (~20 pathogens) panel  by PCR     Status: None   Collection Time: 10/02/23  4:26 PM   Specimen: Nasopharyngeal Swab; Respiratory  Result Value Ref Range Status   Adenovirus NOT DETECTED NOT DETECTED Final   Coronavirus 229E NOT DETECTED NOT DETECTED Final    Comment: (NOTE) The Coronavirus on the Respiratory Panel, DOES NOT test for the novel  Coronavirus (2019 nCoV)    Coronavirus HKU1 NOT DETECTED NOT DETECTED Final   Coronavirus NL63 NOT DETECTED NOT DETECTED Final   Coronavirus OC43 NOT DETECTED NOT DETECTED Final   Metapneumovirus NOT DETECTED NOT DETECTED Final   Rhinovirus / Enterovirus NOT DETECTED NOT DETECTED Final   Influenza A NOT DETECTED NOT DETECTED Final   Influenza B NOT DETECTED NOT DETECTED Final   Parainfluenza Virus 1 NOT DETECTED NOT DETECTED Final   Parainfluenza Virus 2 NOT DETECTED NOT DETECTED Final   Parainfluenza Virus 3 NOT DETECTED NOT DETECTED Final   Parainfluenza Virus 4 NOT DETECTED NOT DETECTED Final   Respiratory Syncytial Virus NOT DETECTED NOT DETECTED Final   Bordetella pertussis NOT DETECTED NOT DETECTED Final   Bordetella Parapertussis NOT DETECTED NOT DETECTED Final   Chlamydophila pneumoniae NOT DETECTED NOT DETECTED Final   Mycoplasma pneumoniae NOT DETECTED NOT DETECTED Final    Comment: Performed at [facility], [address], [place], [place] [address]     Time coordinating discharge: 35 minutes  SIGNED:   Renato Applebaum, MD  [facility] 10/05/2023, 10:07 AM

## 2023-10-06 ENCOUNTER — Telehealth: Payer: Self-pay | Admitting: *Deleted

## 2023-10-06 NOTE — Transitions of Care (Post Inpatient/ED Visit) (Signed)
   10/06/2023  Name: Marselino Slayton MRN: [id number] DOB: 12/05/1955  Today's TOC FU Call Status: Today's TOC FU Call Status:: Unsuccessful Call (1st Attempt) Unsuccessful Call (1st Attempt) Date: 10/06/23  Attempted to reach the patient regarding the most recent Inpatient/ED visit.  Follow Up Plan: Additional outreach attempts will be made to reach the patient to complete the Transitions of Care (Post Inpatient/ED visit) call.   Andrea Dimes RN, BSN [place]  [facility] RN Care Manager [phone]

## 2023-10-09 ENCOUNTER — Telehealth: Payer: Self-pay | Admitting: *Deleted

## 2023-10-09 NOTE — Transitions of Care (Post Inpatient/ED Visit) (Signed)
   10/09/2023  Name: Quashawn Jewkes MRN: [id number] DOB: Aug 08, 1955  Today's TOC FU Call Status: Today's TOC FU Call Status:: Unsuccessful Call (2nd Attempt) Unsuccessful Call (2nd Attempt) Date: 10/09/23  Attempted to reach the patient regarding the most recent Inpatient/ED visit.  Follow Up Plan: Additional outreach attempts will be made to reach the patient to complete the Transitions of Care (Post Inpatient/ED visit) call.   Andrea Dimes RN, BSN [place]  [facility] RN Care Manager [phone]

## 2023-10-10 ENCOUNTER — Telehealth: Payer: Self-pay | Admitting: *Deleted

## 2023-10-10 NOTE — Transitions of Care (Post Inpatient/ED Visit) (Signed)
   10/10/2023  Name: Jason Wong MRN: [id number] DOB: 1955/04/09  Today's TOC FU Call Status: Today's TOC FU Call Status:: Unsuccessful Call (3rd Attempt) Unsuccessful Call (3rd Attempt) Date: 10/10/23  Attempted to reach the patient regarding the most recent Inpatient/ED visit.  Follow Up Plan: No further outreach attempts will be made at this time. We have been unable to contact the patient.  Andrea Dimes RN, BSN   [facility] RN Care Manager [phone]

## 2023-10-12 ENCOUNTER — Telehealth: Payer: Self-pay | Admitting: Internal Medicine

## 2023-10-12 NOTE — Telephone Encounter (Signed)
 Called pt to confirm appt for 7/24 unable to complete call at this time

## 2023-10-13 ENCOUNTER — Other Ambulatory Visit: Payer: Self-pay

## 2023-10-13 ENCOUNTER — Encounter: Payer: Self-pay | Admitting: Internal Medicine

## 2023-10-13 ENCOUNTER — Ambulatory Visit: Attending: Internal Medicine | Admitting: Internal Medicine

## 2023-10-13 ENCOUNTER — Ambulatory Visit: Admitting: Internal Medicine

## 2023-10-13 VITALS — BP 128/78 | HR 71 | Temp 98.3°F | Ht 67.0 in | Wt 120.0 lb

## 2023-10-13 DIAGNOSIS — J432 Centrilobular emphysema: Secondary | ICD-10-CM | POA: Diagnosis not present

## 2023-10-13 DIAGNOSIS — Z09 Encounter for follow-up examination after completed treatment for conditions other than malignant neoplasm: Secondary | ICD-10-CM | POA: Diagnosis not present

## 2023-10-13 DIAGNOSIS — F17210 Nicotine dependence, cigarettes, uncomplicated: Secondary | ICD-10-CM

## 2023-10-13 DIAGNOSIS — F17200 Nicotine dependence, unspecified, uncomplicated: Secondary | ICD-10-CM

## 2023-10-13 DIAGNOSIS — F1090 Alcohol use, unspecified, uncomplicated: Secondary | ICD-10-CM

## 2023-10-13 MED ORDER — TRELEGY ELLIPTA 200-62.5-25 MCG/ACT IN AEPB
INHALATION_SPRAY | RESPIRATORY_TRACT | 5 refills | Status: DC
  Filled 2023-10-13: qty 60, 30d supply, fill #0

## 2023-10-13 MED ORDER — BREZTRI AEROSPHERE 160-9-4.8 MCG/ACT IN AERO
2.0000 | INHALATION_SPRAY | Freq: Two times a day (BID) | RESPIRATORY_TRACT | 2 refills | Status: DC
  Filled 2023-10-13 – 2023-11-06 (×2): qty 10.7, 30d supply, fill #0

## 2023-10-13 NOTE — Progress Notes (Signed)
 Patient ID: Jason Wong, male    DOB: 04/15/1955  MRN: [id number]  CC: Hypertension (HTN f/u. Thompson which meds need to be continued or discontinued/Yes to Hep B vax.)   Subjective: Jason Wong is a 68 y.o. male who presents for chronic ds management. His concerns today include:  Pt with hx of HTN, tob dep, seasonal allergies, asthma, centrilobular emphysema, insomnia, coronary atherosclerosis, pre-DM, polysub abuse (ETOH, cocaine, drug over dose accidentally 08/2020)  and hep C treated.    Discussed the use of AI scribe software for clinical note transcription with the patient, who gave verbal consent to proceed.  History of Present Illness Jason Wong is a 68 year old male with COPD who presents for a follow-up visit after recent hospitalization for COPD exacerbation.  Patient did not bring his medications with him except his albuterol  inhaler.  He was hospitalized from July 14th to July 17th for an exacerbation of COPD, following a similar hospitalization in early June.  He was discharged on doxycycline  and prednisone  taper.  He was also given refills on Incruse inhaler and Brovana  and Pulmicort  nebulizer treatments.  He thinks he picked up but misplaced the Incruse inhaler.  He does not know the names of the nebulizer treatments he has at home and whether they are 2 different treatments.  He was discharged with antibiotics and prednisone , and he has completed the antibiotics. He continues to smoke one cigarette a day and consumes alcohol, including a 16-ounce beer and a bottle of liquor, though not daily. No use of street drugs and he has given up cocaine.    Patient Active Problem List   Diagnosis Date Noted   Protein-calorie malnutrition, severe 08/26/2023   SOB (shortness of breath) 08/24/2023   Hematuria 05/17/2023   Acute on chronic respiratory failure with hypoxia (HCC) 05/16/2023   Cystitis 05/14/2023   Influenza A 05/13/2023   Protein-calorie malnutrition (HCC)  05/13/2023   Urinary retention due to benign prostatic hyperplasia 05/13/2023   Acute respiratory failure with hypoxia (HCC) 02/17/2023   HTN (hypertension) 02/17/2023   HLD (hyperlipidemia) 02/17/2023   Tobacco use 02/17/2023   Acute exacerbation of chronic obstructive pulmonary disease (COPD) (HCC) 02/17/2023   Multifocal pneumonia 01/30/2023   COPD with acute exacerbation (HCC) 01/30/2023   COPD with acute exacerbation (HCC) 06/20/2022   Multifocal pneumonia 06/20/2022   Hypokalemia 06/20/2022   Acute respiratory failure with hypoxia (HCC) 06/20/2022   Polysubstance abuse (HCC) 05/09/2022   On home oxygen  therapy 08/31/2021   Malnutrition of moderate degree 06/16/2021   Acute exacerbation of chronic obstructive pulmonary disease (COPD) (HCC) 06/14/2021   ETOH abuse 05/01/2021   Acute hypoxemic respiratory failure (HCC) 04/30/2021   HLD (hyperlipidemia) 04/30/2021   COPD (chronic obstructive pulmonary disease) with emphysema (HCC) 02/19/2021   COPD exacerbation (HCC) 02/18/2021   Memory deficit 04/28/2020   Hepatitis C virus infection cured after antiviral drug therapy 04/27/2018   Enuresis 04/27/2018   Centrilobular emphysema (HCC) 04/27/2018   Seasonal allergic rhinitis 04/27/2018   Tobacco dependence 08/24/2017   Weight loss, unintentional 08/24/2017   Insomnia 06/27/2017   Prediabetes 01/24/2017   Asthma 06/10/2016   Essential hypertension 06/10/2016   Current every day smoker 06/10/2016     Current Outpatient Medications on File Prior to Visit  Medication Sig Dispense Refill   albuterol  (VENTOLIN  HFA) 108 (90 Base) MCG/ACT inhaler Inhale 1-2 puffs into the lungs every 6 (six) hours as needed for wheezing or shortness of breath. (Patient not taking: Reported on  10/13/2023) 18 g 0   arformoterol  (BROVANA ) 15 MCG/2ML NEBU Inhale 2 mLs (15 mcg total) into the lungs 2 (two) times daily. (Patient not taking: Reported on 10/13/2023) 120 mL 2   atorvastatin  (LIPITOR) 10 MG tablet  Take 1 tablet (10 mg total) by mouth daily. (Patient not taking: Reported on 10/13/2023) 90 tablet 1   budesonide  (PULMICORT ) 0.25 MG/2ML nebulizer solution Take 2 mLs (0.25 mg total) by nebulization 2 (two) times daily. (Patient not taking: Reported on 10/13/2023) 120 mL 1   folic acid  (FOLVITE ) 1 MG tablet Take 1 tablet (1 mg total) by mouth daily. (Patient not taking: Reported on 10/13/2023) 30 tablet 0   ipratropium-albuterol  (DUONEB) 0.5-2.5 (3) MG/3ML SOLN Take 3 mLs by nebulization every 6 (six) hours as needed (shortness of breath , wheezing). (Patient not taking: Reported on 10/13/2023) 360 mL 0   pantoprazole  (PROTONIX ) 40 MG tablet Take 1 tablet (40 mg total) by mouth daily at 6 (six) AM. (Patient not taking: Reported on 10/13/2023) 60 tablet 1   sodium chloride  (OCEAN) 0.65 % SOLN nasal spray Place 1 spray into both nostrils as needed for congestion. (Patient not taking: Reported on 10/13/2023) 30 mL 1   thiamine  (VITAMIN B1) 100 MG tablet Take 1 tablet (100 mg total) by mouth daily. (Patient not taking: Reported on 10/13/2023) 30 tablet 0   No current facility-administered medications on file prior to visit.    Allergies  Allergen Reactions   Tobacco Shortness Of Breath and Other (See Comments)    CIGARETTE SMOKE TRIGGERS THE PATIENT'S RESPIRATORY ISSUES!!    Social History   Socioeconomic History   Marital status: Single    Spouse name: Not on file   Number of children: Not on file   Years of education: Not on file   Highest education level: Not on file  Occupational History   Not on file  Tobacco Use   Smoking status: Every Day    Types: Cigarettes   Smokeless tobacco: Never  Vaping Use   Vaping status: Never Used  Substance and Sexual Activity   Alcohol use: Yes    Alcohol/week: 2.0 standard drinks of alcohol    Types: 2 Cans of beer per week   Drug use: Not Currently    Types: Crack cocaine   Sexual activity: Yes    Partners: Female  Other Topics Concern   Not on  file  Social History Narrative   ** Merged History Encounter **       Pt states he is from home with his nephew. He uses the bus to get to appointments and the grocery store and will be using the bus to get home. He has a nebulizer at home and two metal oxygen  tanks that he uses as needed. He states he doesn't know who fil   ls them, but says they do know who to call when they get empty.   Social Drivers of Corporate investment banker Strain: Low Risk  (07/24/2023)   Overall Financial Resource Strain (CARDIA)    Difficulty of Paying Living Expenses: Not hard at all  Food Insecurity: No Food Insecurity (10/03/2023)   Hunger Vital Sign    Worried About Running Out of Food in the Last Year: Never true    Ran Out of Food in the Last Year: Never true  Transportation Needs: No Transportation Needs (10/03/2023)   PRAPARE - Administrator, Civil Service (Medical): No    Lack of Transportation (Non-Medical): No  Physical Activity: Inactive (07/24/2023)   Exercise Vital Sign    Days of Exercise per Week: 0 days    Minutes of Exercise per Session: 0 min  Stress: No Stress Concern Present (07/24/2023)   [employer] of Occupational Health - Occupational Stress Questionnaire    Feeling of Stress : Only a little  Social Connections: Unknown (10/03/2023)   Social Connection and Isolation Panel    Frequency of Communication with Friends and Family: Three times a week    Frequency of Social Gatherings with Friends and Family: Three times a week    Attends Religious Services: Never    Active Member of Clubs or Organizations: No    Attends Banker Meetings: Never    Marital Status: Patient declined  Intimate Partner Violence: Not At Risk (10/03/2023)   Humiliation, Afraid, Rape, and Kick questionnaire    Fear of Current or Ex-Partner: No    Emotionally Abused: No    Physically Abused: No    Sexually Abused: No    Family History  Problem Relation Age of Onset    Hypertension Mother    Breast cancer Sister    Hypertension Sister    Colon cancer Brother    Colon polyps Neg Hx    Esophageal cancer Neg Hx    Rectal cancer Neg Hx    Stomach cancer Neg Hx     Past Surgical History:  Procedure Laterality Date   INCISE AND DRAIN ABCESS     dog bite    ROS: Review of Systems Negative except as stated above  PHYSICAL EXAM: BP 128/84 (BP Location: Left Arm, Patient Position: Sitting, Cuff Size: Small)   Pulse 71   Temp 98.3 F (36.8 C) (Oral)   Ht 5' 7 (1.702 m)   Wt 120 lb (54.4 kg)   SpO2 97%   BMI 18.79 kg/m   Physical Exam   General appearance -older African-American male in NAD.  He appears slightly unkept. Mental status -patient is poor historian Neck - supple, no significant adenopathy Chest -breath sounds mild to moderately decreased bilaterally without wheezes Heart - normal rate, regular rhythm, normal S1, S2, no murmurs, rubs, clicks or gallops     Latest Ref Rng & Units 10/03/2023    4:58 AM 10/02/2023    4:27 PM 09/25/2023    7:14 AM  CMP  Glucose 70 - 99 mg/dL 828  87  99   BUN 8 - 23 mg/dL 16  13  19    Creatinine 0.61 - 1.24 mg/dL 8.91  8.90  9.06   Sodium 135 - 145 mmol/L 137  142  141   Potassium 3.5 - 5.1 mmol/L 4.8  4.0  3.7   Chloride 98 - 111 mmol/L 107  105  108   CO2 22 - 32 mmol/L 18  22  22    Calcium  8.9 - 10.3 mg/dL 9.2  9.5  8.9   Total Protein 6.5 - 8.1 g/dL 6.5     Total Bilirubin 0.0 - 1.2 mg/dL 1.3     Alkaline Phos 38 - 126 U/L 57     AST 15 - 41 U/L 23     ALT 0 - 44 U/L 16      Lipid Panel     Component Value Date/Time   CHOL 171 09/06/2019 1053   TRIG 57 09/06/2019 1053   HDL 70 09/06/2019 1053   CHOLHDL 2.4 09/06/2019 1053   LDLCALC 90 09/06/2019 1053    CBC  Component Value Date/Time   WBC 4.7 10/03/2023 0458   RBC 4.26 10/03/2023 0458   HGB 14.1 10/03/2023 0458   HGB 17.2 09/06/2019 1053   HCT 42.2 10/03/2023 0458   HCT 49.9 09/06/2019 1053   PLT 260 10/03/2023 0458    PLT 274 09/06/2019 1053   MCV 99.1 10/03/2023 0458   MCV 98 (H) 09/06/2019 1053   MCH 33.1 10/03/2023 0458   MCHC 33.4 10/03/2023 0458   RDW 13.0 10/03/2023 0458   RDW 11.5 (L) 09/06/2019 1053   LYMPHSABS 2.0 09/18/2023 1055   LYMPHSABS 2.5 06/13/2016 1110   MONOABS 0.6 09/18/2023 1055   EOSABS 0.8 (H) 09/18/2023 1055   EOSABS 0.1 06/13/2016 1110   BASOSABS 0.0 09/18/2023 1055   BASOSABS 0.0 06/13/2016 1110    ASSESSMENT AND PLAN: 1. Hospital discharge follow-up (Primary)   2. Centrilobular emphysema (HCC) Frequent exacerbations with poor symptom control due to poor health literacy, non-compliance and continued smoking. Informed about maintenance inhaler necessity. - Discontinue Incruse inhaler. - Prescribe Trelegy which we will try to get for him through the patient assistance program.. - Instruct daily use of new inhaler. - Will have him follow-up with me in several weeks for his Medicare wellness visit.  Advised to bring a box of each of the nebulizer treatments that he has at home so I can verify what he has.  3. Tobacco dependence Strongly encouraged smoking cessation.  4. Alcohol use disorder Discussed ongoing health risks associated with excessive alcohol use.  Encouraged him to cut back or better yet stop completely.  Patient was given the opportunity to ask questions.  Patient verbalized understanding of the plan and was able to repeat key elements of the plan.   This documentation was completed using Paediatric nurse.  Any transcriptional errors are unintentional.  No orders of the defined types were placed in this encounter.    Requested Prescriptions   Signed Prescriptions Disp Refills   budesonide -glycopyrrolate -formoterol  (BREZTRI  AEROSPHERE) 160-9-4.8 MCG/ACT AERO inhaler 10.7 g 2    Sig: Inhale 2 puffs into the lungs 2 (two) times daily.    Return in about 7 weeks (around 12/01/2023) for give appt with me for his Medicare Wellness in 7  wks.  Barnie Louder, MD, FACP

## 2023-10-14 ENCOUNTER — Other Ambulatory Visit: Payer: Self-pay

## 2023-10-14 ENCOUNTER — Telehealth: Payer: Self-pay | Admitting: Internal Medicine

## 2023-10-14 MED ORDER — BREZTRI AEROSPHERE 160-9-4.8 MCG/ACT IN AERO
2.0000 | INHALATION_SPRAY | Freq: Two times a day (BID) | RESPIRATORY_TRACT | 12 refills | Status: DC
  Filled 2023-10-14: qty 10.7, 30d supply, fill #0

## 2023-10-14 NOTE — Telephone Encounter (Signed)
-----   Message from Burnard KANDICE Lot sent at 10/13/2023 12:58 PM EDT ----- This patient's trelegy is over $200 due to a deductible. The patient is being advised to call his benefits coverage to setup a payment plan towards the deductible to lower his copay. He does not qualify for assistance enrollment for Trelegy without documentation (proof he has spent $600 out of pocket from 03/22/2023).  If Breztri  is an appropriate alternative he would qualify for assistance for this. Can he be changed to Breztri ?

## 2023-10-16 ENCOUNTER — Other Ambulatory Visit (HOSPITAL_COMMUNITY): Payer: Self-pay

## 2023-10-16 ENCOUNTER — Emergency Department (HOSPITAL_COMMUNITY)
Admission: EM | Admit: 2023-10-16 | Discharge: 2023-10-16 | Disposition: A | Discharge status: Home / Self Care | Attending: Emergency Medicine | Admitting: Emergency Medicine

## 2023-10-16 ENCOUNTER — Emergency Department (HOSPITAL_COMMUNITY)

## 2023-10-16 ENCOUNTER — Other Ambulatory Visit: Payer: Self-pay

## 2023-10-16 ENCOUNTER — Encounter (HOSPITAL_COMMUNITY): Payer: Self-pay

## 2023-10-16 DIAGNOSIS — J449 Chronic obstructive pulmonary disease, unspecified: Secondary | ICD-10-CM | POA: Diagnosis not present

## 2023-10-16 DIAGNOSIS — F17200 Nicotine dependence, unspecified, uncomplicated: Secondary | ICD-10-CM | POA: Diagnosis not present

## 2023-10-16 DIAGNOSIS — J441 Chronic obstructive pulmonary disease with (acute) exacerbation: Secondary | ICD-10-CM | POA: Diagnosis not present

## 2023-10-16 DIAGNOSIS — R0602 Shortness of breath: Secondary | ICD-10-CM | POA: Diagnosis not present

## 2023-10-16 DIAGNOSIS — Z720 Tobacco use: Secondary | ICD-10-CM | POA: Insufficient documentation

## 2023-10-16 LAB — CBC
HCT: 42 % (ref 39.0–52.0)
Hemoglobin: 14.2 g/dL (ref 13.0–17.0)
MCH: 33.1 pg (ref 26.0–34.0)
MCHC: 33.8 g/dL (ref 30.0–36.0)
MCV: 97.9 fL (ref 80.0–100.0)
Platelets: 227 K/uL (ref 150–400)
RBC: 4.29 MIL/uL (ref 4.22–5.81)
RDW: 13.6 % (ref 11.5–15.5)
WBC: 6.3 K/uL (ref 4.0–10.5)
nRBC: 0 % (ref 0.0–0.2)

## 2023-10-16 LAB — BASIC METABOLIC PANEL WITH GFR
Anion gap: 10 (ref 5–15)
BUN: 17 mg/dL (ref 8–23)
CO2: 21 mmol/L — ABNORMAL LOW (ref 22–32)
Calcium: 9.4 mg/dL (ref 8.9–10.3)
Chloride: 110 mmol/L (ref 98–111)
Creatinine, Ser: 1.22 mg/dL (ref 0.61–1.24)
GFR, Estimated: >60 mL/min (ref >60)
Glucose, Bld: 98 mg/dL (ref 70–99)
Potassium: 4.1 mmol/L (ref 3.5–5.1)
Sodium: 141 mmol/L (ref 135–145)

## 2023-10-16 MED ORDER — METHYLPREDNISOLONE SODIUM SUCC 125 MG IJ SOLR
125.0000 mg | Freq: Once | INTRAMUSCULAR | Status: AC
  Administered 2023-10-16: 125 mg via INTRAVENOUS
  Filled 2023-10-16: qty 2

## 2023-10-16 MED ORDER — IPRATROPIUM-ALBUTEROL 0.5-2.5 (3) MG/3ML IN SOLN
3.0000 mL | Freq: Once | RESPIRATORY_TRACT | Status: AC
  Administered 2023-10-16: 3 mL via RESPIRATORY_TRACT
  Filled 2023-10-16: qty 3

## 2023-10-16 MED ORDER — ALBUTEROL SULFATE HFA 108 (90 BASE) MCG/ACT IN AERS
2.0000 | INHALATION_SPRAY | Freq: Once | RESPIRATORY_TRACT | Status: AC
  Administered 2023-10-16: 2 via RESPIRATORY_TRACT
  Filled 2023-10-16: qty 6.7

## 2023-10-16 MED ORDER — PREDNISONE 10 MG PO TABS
40.0000 mg | ORAL_TABLET | Freq: Every day | ORAL | 0 refills | Status: AC
  Filled 2023-10-16: qty 12, 3d supply, fill #0

## 2023-10-16 NOTE — ED Provider Notes (Signed)
 [place] EMERGENCY DEPARTMENT AT [facility] Provider Note   CSN: [id number] Arrival date & time: 10/16/23  1046     Patient presents with: Shortness of Breath   Nikhil Osei is a 68 y.o. male.  With a history of COPD, asthma and tobacco use who presents to ED for shortness of breath.  Shortness of breath began last night and persisted since the onset.  Patient states he has no albuterol  at home.  No fevers chills chest pain or GI symptoms.  Recently seen here and admitted for COPD exacerbation.  No home O2.    Shortness of Breath      Prior to Admission medications   Medication Sig Start Date End Date Taking? Authorizing Provider  arformoterol  (BROVANA ) 15 MCG/2ML NEBU Inhale 2 mLs (15 mcg total) into the lungs 2 (two) times daily. 08/28/23  Yes Vicci Barnie NOVAK, MD  predniSONE  (DELTASONE ) 10 MG tablet Take 4 tablets (40 mg total) by mouth daily for 3 days. 10/16/23 10/19/23 Yes Pamella Ozell LABOR, DO  albuterol  (VENTOLIN  HFA) 108 (90 Base) MCG/ACT inhaler Inhale 1-2 puffs into the lungs every 6 (six) hours as needed for wheezing or shortness of breath. Patient not taking: Reported on 10/13/2023 09/18/23   Theotis Cameron HERO, PA-C  atorvastatin  (LIPITOR) 10 MG tablet Take 1 tablet (10 mg total) by mouth daily. Patient not taking: No sig reported 10/05/23   Raenelle Coria, MD  budesonide  (PULMICORT ) 0.25 MG/2ML nebulizer solution Take 2 mLs (0.25 mg total) by nebulization 2 (two) times daily. Patient not taking: No sig reported 10/05/23   Raenelle Coria, MD  budesonide -glycopyrrolate -formoterol  (BREZTRI  AEROSPHERE) 160-9-4.8 MCG/ACT AERO inhaler Inhale 2 puffs into the lungs 2 (two) times daily. Patient not taking: Reported on 10/16/2023 10/13/23   Vicci Barnie NOVAK, MD  budesonide -glycopyrrolate -formoterol  (BREZTRI  AEROSPHERE) 160-9-4.8 MCG/ACT AERO inhaler Inhale 2 puffs into the lungs 2 (two) times daily. Patient not taking: Reported on 10/16/2023 10/14/23   Vicci Barnie NOVAK, MD   folic acid  (FOLVITE ) 1 MG tablet Take 1 tablet (1 mg total) by mouth daily. Patient not taking: Reported on 10/03/2023 08/26/23   Caleen Burgess BROCKS, MD  ipratropium-albuterol  (DUONEB) 0.5-2.5 (3) MG/3ML SOLN Take 3 mLs by nebulization every 6 (six) hours as needed (shortness of breath , wheezing). Patient not taking: No sig reported 10/05/23   Raenelle Coria, MD    Allergies: Tobacco    Review of Systems  Respiratory:  Positive for shortness of breath.     Updated Vital Signs BP (!) 129/95 (BP Location: Right Arm)   Pulse 89   Temp 97.6 F (36.4 C)   Resp 20   Ht 5' 7 (1.702 m)   Wt 54 kg   SpO2 97%   BMI 18.65 kg/m   Physical Exam Vitals and nursing note reviewed.  HENT:     Head: Normocephalic and atraumatic.  Eyes:     Pupils: Pupils are equal, round, and reactive to light.  Cardiovascular:     Rate and Rhythm: Normal rate and regular rhythm.  Pulmonary:     Effort: Pulmonary effort is normal.     Breath sounds: Examination of the right-upper field reveals decreased breath sounds. Examination of the left-upper field reveals decreased breath sounds. Examination of the right-lower field reveals decreased breath sounds. Examination of the left-lower field reveals decreased breath sounds. Decreased breath sounds present.  Abdominal:     Palpations: Abdomen is soft.     Tenderness: There is no abdominal tenderness.  Skin:  General: Skin is warm and dry.  Neurological:     Mental Status: He is alert.  Psychiatric:        Mood and Affect: Mood normal.     (all labs ordered are listed, but only abnormal results are displayed) Labs Reviewed  BASIC METABOLIC PANEL WITH GFR - Abnormal; Notable for the following components:      Result Value   CO2 21 (*)    All other components within normal limits  CBC    EKG: EKG Interpretation Date/Time:  Monday October 16 2023 10:55:47 EDT Ventricular Rate:  88 PR Interval:  130 QRS Duration:  80 QT Interval:  344 QTC  Calculation: 416 R Axis:   86  Text Interpretation: Normal sinus rhythm with sinus arrhythmia Right atrial enlargement Borderline ECG When compared with ECG of 02-Oct-2023 22:36, PREVIOUS ECG IS PRESENT Confirmed by Pamella Sharper [phone]) on 10/16/2023 3:10:25 PM  Radiology: [facility] Chest 2 View Result Date: 10/16/2023 CLINICAL DATA:  Shortness of breath since yesterday.  Smoker. EXAM: CHEST - 2 VIEW COMPARISON:  10/02/2023 FINDINGS: Normal sized heart. The lungs remain mildly hyperexpanded and clear. Unremarkable bones. IMPRESSION: 1. No acute abnormality. 2. Stable mild changes of COPD. Electronically Signed   By: Elspeth Bathe M.D.   On: 10/16/2023 12:00     Procedures   Medications Ordered in the ED  albuterol  (VENTOLIN  HFA) 108 (90 Base) MCG/ACT inhaler 2 puff (has no administration in time range)  ipratropium-albuterol  (DUONEB) 0.5-2.5 (3) MG/3ML nebulizer solution 3 mL (3 mLs Nebulization Given 10/16/23 1217)  methylPREDNISolone  sodium succinate (SOLU-MEDROL ) 125 mg/2 mL injection 125 mg (125 mg Intravenous Given 10/16/23 1217)    Clinical Course as of 10/16/23 1512  Mon Oct 16, 2023  1509 Laboratory workup unremarkable.  EKG without ischemic changes or dysrhythmia.  Patient moving better after DuoNeb and Solu-Medrol .  Will discharge with albuterol  MDI and a couple more days of steroids.  He will follow-up with his PCP and understands return precautions will be worrisome for worsening respiratory status. [MP]    Clinical Course User Index [MP] Pamella Sharper LABOR, DO                                 Medical Decision Making 68 year old male with history as above presenting to ED for recurrent shortness of breath.  Feels similar to prior COPD exacerbations.  Symptoms started last night.  No albuterol  at home.  Recently admitted for COPD exacerbation.  Will obtain laboratory workup EKG to look for any evidence of leukocytosis, anemia, electrolyte imbalance or dysrhythmia.  Will obtain chest x-ray  to look for full consolidation of be concerning for pneumonia.  Most likely COPD exacerbation.  Will provide DuoNeb, Solu-Medrol  and reevaluate  Amount and/or Complexity of Data Reviewed Radiology: ordered.  Risk Prescription drug management.        Final diagnoses:  COPD exacerbation [facility])    ED Discharge Orders          Ordered    predniSONE  (DELTASONE ) 10 MG tablet  Daily        10/16/23 1506               Pamella Sharper A, DO 10/16/23 1512

## 2023-10-16 NOTE — ED Triage Notes (Signed)
 C/o shob that started yesterday. Endorses using inhalers at home with no relief. Denies pain

## 2023-10-16 NOTE — ED Notes (Signed)
 Got patient into a gown on the monitor patient is resting with call bell in reach

## 2023-10-16 NOTE — ED Notes (Signed)
 Patient exited ED with no complaints, VSS, NAD. Verbalized understanding of discharge instructions.

## 2023-10-16 NOTE — Discharge Instructions (Addendum)
 You were seen in the emergency department for a COPD exacerbation Your breathing proved after a nebulizer treatment and steroids here We have called in 3 more days of steroids for you to pick up from your pharmacy and begin taking as directed to help with COPD exacerbation We gave you an albuterol  MDI to go home with You should follow-up with your PCP within 1 week for reevaluation Return to the Emergency Department for trouble breathing or any other concerns

## 2023-10-20 ENCOUNTER — Other Ambulatory Visit: Payer: Self-pay

## 2023-10-20 ENCOUNTER — Telehealth: Payer: Self-pay

## 2023-10-20 NOTE — Telephone Encounter (Signed)
 Received notification from [employer] regarding approval for BREZTRI . Patient assistance approved from 10/20/2023 to 03/20/2024.  Medication will ship to [facility] [id number]  Pt ID: [id number]  Company phone: [phone]

## 2023-10-23 ENCOUNTER — Other Ambulatory Visit: Payer: Self-pay

## 2023-10-27 ENCOUNTER — Emergency Department (HOSPITAL_COMMUNITY)

## 2023-10-27 ENCOUNTER — Encounter (HOSPITAL_COMMUNITY): Payer: Self-pay

## 2023-10-27 ENCOUNTER — Emergency Department (HOSPITAL_COMMUNITY)
Admission: EM | Admit: 2023-10-27 | Discharge: 2023-10-27 | Disposition: A | Discharge status: Home / Self Care | Attending: Emergency Medicine | Admitting: Emergency Medicine

## 2023-10-27 ENCOUNTER — Other Ambulatory Visit: Payer: Self-pay

## 2023-10-27 DIAGNOSIS — F17200 Nicotine dependence, unspecified, uncomplicated: Secondary | ICD-10-CM | POA: Diagnosis not present

## 2023-10-27 DIAGNOSIS — J441 Chronic obstructive pulmonary disease with (acute) exacerbation: Secondary | ICD-10-CM | POA: Insufficient documentation

## 2023-10-27 DIAGNOSIS — Z7951 Long term (current) use of inhaled steroids: Secondary | ICD-10-CM | POA: Insufficient documentation

## 2023-10-27 DIAGNOSIS — R0602 Shortness of breath: Secondary | ICD-10-CM | POA: Diagnosis not present

## 2023-10-27 LAB — BRAIN NATRIURETIC PEPTIDE: B Natriuretic Peptide: 60.7 pg/mL (ref 0.0–100.0)

## 2023-10-27 LAB — CBC
HCT: 41.9 % (ref 39.0–52.0)
Hemoglobin: 14.2 g/dL (ref 13.0–17.0)
MCH: 33.4 pg (ref 26.0–34.0)
MCHC: 33.9 g/dL (ref 30.0–36.0)
MCV: 98.6 fL (ref 80.0–100.0)
Platelets: 272 K/uL (ref 150–400)
RBC: 4.25 MIL/uL (ref 4.22–5.81)
RDW: 14 % (ref 11.5–15.5)
WBC: 7 K/uL (ref 4.0–10.5)
nRBC: 0 % (ref 0.0–0.2)

## 2023-10-27 LAB — BASIC METABOLIC PANEL WITH GFR
Anion gap: 14 (ref 5–15)
BUN: 13 mg/dL (ref 8–23)
CO2: 22 mmol/L (ref 22–32)
Calcium: 9.7 mg/dL (ref 8.9–10.3)
Chloride: 107 mmol/L (ref 98–111)
Creatinine, Ser: 1.05 mg/dL (ref 0.61–1.24)
GFR, Estimated: >60 mL/min (ref >60)
Glucose, Bld: 148 mg/dL — ABNORMAL HIGH (ref 70–99)
Potassium: 3.7 mmol/L (ref 3.5–5.1)
Sodium: 143 mmol/L (ref 135–145)

## 2023-10-27 MED ORDER — ALBUTEROL SULFATE HFA 108 (90 BASE) MCG/ACT IN AERS
2.0000 | INHALATION_SPRAY | RESPIRATORY_TRACT | 0 refills | Status: DC | PRN
  Filled 2023-10-27: qty 18, 25d supply, fill #0

## 2023-10-27 MED ORDER — IPRATROPIUM-ALBUTEROL 0.5-2.5 (3) MG/3ML IN SOLN
3.0000 mL | RESPIRATORY_TRACT | Status: AC
  Administered 2023-10-27 (×3): 3 mL via RESPIRATORY_TRACT
  Filled 2023-10-27: qty 9

## 2023-10-27 MED ORDER — ALBUTEROL SULFATE HFA 108 (90 BASE) MCG/ACT IN AERS
2.0000 | INHALATION_SPRAY | RESPIRATORY_TRACT | Status: DC | PRN
  Filled 2023-10-27: qty 6.7

## 2023-10-27 MED ORDER — PREDNISONE 10 MG PO TABS
20.0000 mg | ORAL_TABLET | Freq: Every day | ORAL | 0 refills | Status: AC
  Filled 2023-10-27: qty 10, 5d supply, fill #0

## 2023-10-27 MED ORDER — METHYLPREDNISOLONE SODIUM SUCC 125 MG IJ SOLR
125.0000 mg | Freq: Once | INTRAMUSCULAR | Status: AC
  Administered 2023-10-27: 125 mg via INTRAVENOUS
  Filled 2023-10-27: qty 2

## 2023-10-27 MED ORDER — SODIUM CHLORIDE 0.9 % IV SOLN
500.0000 mg | Freq: Once | INTRAVENOUS | Status: AC
  Administered 2023-10-27: 500 mg via INTRAVENOUS
  Filled 2023-10-27: qty 5

## 2023-10-27 MED ORDER — IPRATROPIUM-ALBUTEROL 0.5-2.5 (3) MG/3ML IN SOLN
3.0000 mL | Freq: Once | RESPIRATORY_TRACT | Status: AC
  Administered 2023-10-27: 3 mL via RESPIRATORY_TRACT
  Filled 2023-10-27: qty 3

## 2023-10-27 NOTE — ED Triage Notes (Signed)
 Hx of COPD, reports symptoms feel like flare up.

## 2023-10-27 NOTE — ED Provider Notes (Signed)
 [place] EMERGENCY DEPARTMENT AT [facility] Provider Note   CSN: [id number] Arrival date & time: 10/27/23  2021     Patient presents with: Shortness of Breath   Jason Wong is a 68 y.o. male past medical history significant for COPD, tobacco use who presents emergency department for shortness of breath.  Patient states that he has run out of his home albuterol  inhaler and began experiencing shortness of breath last night that was not resolved on its own.  Patient denies associated productive cough, new oxygen  requirement, increased sputum production or sputum color change.  Patient denies associated chest pain, fever, abdominal pain, nausea, vomiting.    Shortness of Breath      Prior to Admission medications   Medication Sig Start Date End Date Taking? Authorizing Provider  albuterol  (VENTOLIN  HFA) 108 (90 Base) MCG/ACT inhaler Inhale 2 puffs into the lungs every 4 (four) hours as needed for wheezing or shortness of breath. 10/27/23  Yes Nada Chroman, DO  predniSONE  (DELTASONE ) 10 MG tablet Take 2 tablets (20 mg total) by mouth daily for 5 days. 10/27/23 11/01/23 Yes Nada Chroman, DO  arformoterol  (BROVANA ) 15 MCG/2ML NEBU Inhale 2 mLs (15 mcg total) into the lungs 2 (two) times daily. 08/28/23   Vicci Barnie NOVAK, MD  atorvastatin  (LIPITOR) 10 MG tablet Take 1 tablet (10 mg total) by mouth daily. Patient not taking: No sig reported 10/05/23   Raenelle Coria, MD  budesonide  (PULMICORT ) 0.25 MG/2ML nebulizer solution Take 2 mLs (0.25 mg total) by nebulization 2 (two) times daily. Patient not taking: No sig reported 10/05/23   Raenelle Coria, MD  budesonide -glycopyrrolate -formoterol  (BREZTRI  AEROSPHERE) 160-9-4.8 MCG/ACT AERO inhaler Inhale 2 puffs into the lungs 2 (two) times daily. Patient not taking: Reported on 10/16/2023 10/13/23   Vicci Barnie NOVAK, MD  budesonide -glycopyrrolate -formoterol  (BREZTRI  AEROSPHERE) 160-9-4.8 MCG/ACT AERO inhaler Inhale 2 puffs into the lungs 2  (two) times daily. Patient not taking: Reported on 10/16/2023 10/14/23   Vicci Barnie NOVAK, MD  folic acid  (FOLVITE ) 1 MG tablet Take 1 tablet (1 mg total) by mouth daily. Patient not taking: Reported on 10/03/2023 08/26/23   Caleen Burgess BROCKS, MD  ipratropium-albuterol  (DUONEB) 0.5-2.5 (3) MG/3ML SOLN Take 3 mLs by nebulization every 6 (six) hours as needed (shortness of breath , wheezing). Patient not taking: No sig reported 10/05/23   Raenelle Coria, MD    Allergies: Tobacco    Review of Systems  Respiratory:  Positive for shortness of breath.     Updated Vital Signs BP (!) 161/107   Pulse 74   Temp 98 F (36.7 C) (Oral)   Resp 19   Ht 5' 7 (1.702 m)   Wt 59 kg   SpO2 99%   BMI 20.36 kg/m   Physical Exam Vitals reviewed.  Constitutional:      General: He is not in acute distress. Neck:     Vascular: No JVD.     Trachea: Trachea normal.  Cardiovascular:     Rate and Rhythm: Normal rate.     Heart sounds: Normal heart sounds. No murmur heard. Pulmonary:     Effort: Tachypnea present.     Breath sounds: Examination of the right-upper field reveals wheezing. Examination of the left-upper field reveals wheezing. Examination of the right-middle field reveals wheezing. Examination of the left-middle field reveals wheezing. Wheezing present. No rhonchi or rales.     Comments: Diffuse expiratory wheezing, no focal pulmonary auscultated findings Musculoskeletal:     Right lower leg: No edema.  Left lower leg: No edema.     Comments: No lower extremity unilateral swelling, erythema, tenderness to palpation.  Homans' sign negative  Neurological:     Mental Status: He is alert.     (all labs ordered are listed, but only abnormal results are displayed) Labs Reviewed  BASIC METABOLIC PANEL WITH GFR - Abnormal; Notable for the following components:      Result Value   Glucose, Bld 148 (*)    All other components within normal limits  CBC  BRAIN NATRIURETIC PEPTIDE     EKG: EKG Interpretation Date/Time:  Friday October 27 2023 20:39:06 EDT Ventricular Rate:  84 PR Interval:  138 QRS Duration:  72 QT Interval:  356 QTC Calculation: 420 R Axis:   79  Text Interpretation: Normal sinus rhythm Right atrial enlargement When compared with ECG of 16-Oct-2023 10:55, PREVIOUS ECG IS PRESENT Confirmed by Cottie Cough [phone]) on 10/27/2023 9:07:52 PM  Radiology: [facility] Chest 2 View Result Date: 10/27/2023 CLINICAL DATA:  Shortness of breath EXAM: CHEST - 2 VIEW COMPARISON:  10/16/2023 FINDINGS: Cardiac shadow is stable. Lungs are mildly hyperinflated. No focal infiltrate or effusion is seen. No bony abnormality is noted. IMPRESSION: Mild hyperinflation.  No other focal abnormality is seen. Electronically Signed   By: Oneil Devonshire M.D.   On: 10/27/2023 21:42     Procedures   Medications Ordered in the ED  albuterol  (VENTOLIN  HFA) 108 (90 Base) MCG/ACT inhaler 2 puff (has no administration in time range)  ipratropium-albuterol  (DUONEB) 0.5-2.5 (3) MG/3ML nebulizer solution 3 mL (3 mLs Nebulization Given 10/27/23 2128)  azithromycin  (ZITHROMAX ) 500 mg in sodium chloride  0.9 % 250 mL IVPB (500 mg Intravenous New Bag/Given 10/27/23 2146)  methylPREDNISolone  sodium succinate (SOLU-MEDROL ) 125 mg/2 mL injection 125 mg (125 mg Intravenous Given 10/27/23 2143)  ipratropium-albuterol  (DUONEB) 0.5-2.5 (3) MG/3ML nebulizer solution 3 mL (3 mLs Nebulization Given 10/27/23 2245)    Clinical Course as of 10/27/23 2339  Fri Oct 27, 2023  2123 Chest x-ray reviewed by me: Trachea is midline.  No evidence of pneumothorax, focal consolidation, pulmonary edema, pleural effusion or cardiomegaly.  No subdiaphragmatic air [AG]  2128 EKG reviewed by me: Normal sinus rhythm with normal axis and intervals.  No evidence of acute ischemic change, heart block or dysrhythmia [AG]  2134 68 yo male w/ COPD here with SOB since last night.  No hypoxia here.  BP elevated. Wheezing expiratory on exam.   Treating for likely COPD exacerbation - if improved could likely discharge [MT]    Clinical Course User Index [AG] Nada Chroman, DO [MT] Cottie Cough PARAS, MD                                 Medical Decision Making Amount and/or Complexity of Data Reviewed Labs: ordered. Radiology: ordered.  Risk Prescription drug management.   On initial evaluation patient is hemodynamically stable, afebrile however is tachypneic.  Differential diagnosis include acute on chronic COPD exacerbation, pneumonia, pneumothorax, acute upper respiratory viral illness, pulmonary embolism.  On examination patient does have evidence of diffuse expiratory wheezing and tachypnea therefore will start DuoNeb, give Solu-Medrol  and azithromycin .  Chest x-ray obtained without evidence of pneumothorax or pneumonia therefore do not believe further antibiotic coverage is necessary at this time.  Patient presentation less likely pulmonary embolism as patient has evidence of acute on chronic COPD exacerbation, is not tachycardic/hypoxic and has no clinical signs of DVT therefore  do not believe CT imaging is necessary at this time.  On reevaluation of initial DuoNeb patient continues to have diffuse expiratory wheezing therefore will give 3 back-to-back DuoNeb's.  On reevaluation patient remains hemodynamically stable, afebrile and not in acute distress.  Patient is saturating well on room air and is no longer tachypneic.  Patient has clear lung sounds on examination and endorses improvement in tachypnea and work of breathing.  Long discussion held with patient regarding outpatient medication management and the importance of taking long-acting ICS/LABA/LAMA inhalers.  Patient was advised to call his primary care provider to schedule an appointment in order to discuss his COPD medication management.  Patient will be given prednisone  to be taken for 5 days.  At the time of discharge patient agreed with understood plan of care, was given  strict return precautions and had no further questions     Final diagnoses:  COPD exacerbation [facility])    ED Discharge Orders          Ordered    predniSONE  (DELTASONE ) 10 MG tablet  Daily        10/27/23 2152    albuterol  (VENTOLIN  HFA) 108 (90 Base) MCG/ACT inhaler  Every 4 hours PRN        10/27/23 2152            Lavanda Bolster DO Emergency Medicine PGY2    Bolster Lavanda, DO 10/27/23 2339    Cottie Donnice PARAS, MD 10/28/23 1434

## 2023-10-27 NOTE — Discharge Instructions (Addendum)
 Thank you for allowing us  to care for you today.  Please be sure to take prednisone  daily for 5 days.  Please use your albuterol  inhaler every 4 hours as needed for shortness of breath.  Please ensure to follow-up with your primary care provider regarding COPD long-term management  Please call your primary care provider's office in the morning or Monday morning to schedule an appointment regarding COPD management

## 2023-10-27 NOTE — ED Triage Notes (Signed)
 Pt coming in with SOB/wheezing starting last night. Ran out of albuterol  inhaler this morning. Progressively worsening SOB throughout day. Pt labored, wheezing bilaterally, and unable to talk in full sentences on arrival to ED. Albuterol  inhaler given in triage. Mild improvement in breathing noted. Pt denies fever, pain, N/V/D. No other complaints noted.

## 2023-10-28 ENCOUNTER — Other Ambulatory Visit: Payer: Self-pay

## 2023-10-30 ENCOUNTER — Other Ambulatory Visit: Payer: Self-pay

## 2023-11-06 ENCOUNTER — Other Ambulatory Visit: Payer: Self-pay

## 2023-11-08 ENCOUNTER — Encounter (HOSPITAL_COMMUNITY): Payer: Self-pay

## 2023-11-08 ENCOUNTER — Other Ambulatory Visit: Payer: Self-pay

## 2023-11-08 ENCOUNTER — Emergency Department (HOSPITAL_COMMUNITY)

## 2023-11-08 ENCOUNTER — Observation Stay (HOSPITAL_COMMUNITY)
Admission: EM | Admit: 2023-11-08 | Discharge: 2023-11-10 | Disposition: A | Discharge status: Home / Self Care | Attending: Internal Medicine | Admitting: Internal Medicine

## 2023-11-08 DIAGNOSIS — I10 Essential (primary) hypertension: Secondary | ICD-10-CM | POA: Insufficient documentation

## 2023-11-08 DIAGNOSIS — F1910 Other psychoactive substance abuse, uncomplicated: Secondary | ICD-10-CM | POA: Diagnosis present

## 2023-11-08 DIAGNOSIS — Z720 Tobacco use: Secondary | ICD-10-CM | POA: Insufficient documentation

## 2023-11-08 DIAGNOSIS — F17210 Nicotine dependence, cigarettes, uncomplicated: Secondary | ICD-10-CM | POA: Diagnosis not present

## 2023-11-08 DIAGNOSIS — J432 Centrilobular emphysema: Secondary | ICD-10-CM

## 2023-11-08 DIAGNOSIS — E785 Hyperlipidemia, unspecified: Secondary | ICD-10-CM | POA: Diagnosis not present

## 2023-11-08 DIAGNOSIS — F1090 Alcohol use, unspecified, uncomplicated: Secondary | ICD-10-CM | POA: Insufficient documentation

## 2023-11-08 DIAGNOSIS — Z8619 Personal history of other infectious and parasitic diseases: Secondary | ICD-10-CM | POA: Diagnosis not present

## 2023-11-08 DIAGNOSIS — J441 Chronic obstructive pulmonary disease with (acute) exacerbation: Secondary | ICD-10-CM | POA: Diagnosis not present

## 2023-11-08 DIAGNOSIS — F1410 Cocaine abuse, uncomplicated: Secondary | ICD-10-CM | POA: Diagnosis not present

## 2023-11-08 DIAGNOSIS — R0602 Shortness of breath: Secondary | ICD-10-CM | POA: Diagnosis not present

## 2023-11-08 DIAGNOSIS — E7800 Pure hypercholesterolemia, unspecified: Secondary | ICD-10-CM

## 2023-11-08 DIAGNOSIS — R0682 Tachypnea, not elsewhere classified: Secondary | ICD-10-CM | POA: Diagnosis present

## 2023-11-08 DIAGNOSIS — F17200 Nicotine dependence, unspecified, uncomplicated: Secondary | ICD-10-CM | POA: Diagnosis present

## 2023-11-08 DIAGNOSIS — R059 Cough, unspecified: Secondary | ICD-10-CM | POA: Diagnosis not present

## 2023-11-08 LAB — BASIC METABOLIC PANEL WITH GFR
Anion gap: 16 — ABNORMAL HIGH (ref 5–15)
BUN: 11 mg/dL (ref 8–23)
CO2: 23 mmol/L (ref 22–32)
Calcium: 9.7 mg/dL (ref 8.9–10.3)
Chloride: 105 mmol/L (ref 98–111)
Creatinine, Ser: 0.98 mg/dL (ref 0.61–1.24)
GFR, Estimated: >60 mL/min (ref >60)
Glucose, Bld: 96 mg/dL (ref 70–99)
Potassium: 4.5 mmol/L (ref 3.5–5.1)
Sodium: 144 mmol/L (ref 135–145)

## 2023-11-08 LAB — CBC
HCT: 43 % (ref 39.0–52.0)
Hemoglobin: 14.4 g/dL (ref 13.0–17.0)
MCH: 33.3 pg (ref 26.0–34.0)
MCHC: 33.5 g/dL (ref 30.0–36.0)
MCV: 99.3 fL (ref 80.0–100.0)
Platelets: 268 K/uL (ref 150–400)
RBC: 4.33 MIL/uL (ref 4.22–5.81)
RDW: 14.9 % (ref 11.5–15.5)
WBC: 6.7 K/uL (ref 4.0–10.5)
nRBC: 0 % (ref 0.0–0.2)

## 2023-11-08 LAB — RAPID URINE DRUG SCREEN, HOSP PERFORMED
Amphetamines: NOT DETECTED
Barbiturates: NOT DETECTED
Benzodiazepines: NOT DETECTED
Cocaine: POSITIVE — AB
Opiates: NOT DETECTED
Tetrahydrocannabinol: NOT DETECTED

## 2023-11-08 LAB — URINALYSIS, ROUTINE W REFLEX MICROSCOPIC
Bilirubin Urine: NEGATIVE
Glucose, UA: NEGATIVE mg/dL
Hgb urine dipstick: NEGATIVE
Ketones, ur: NEGATIVE mg/dL
Leukocytes,Ua: NEGATIVE
Nitrite: NEGATIVE
Protein, ur: NEGATIVE mg/dL
Specific Gravity, Urine: 1.012 (ref 1.005–1.030)
pH: 6 (ref 5.0–8.0)

## 2023-11-08 LAB — ETHANOL: Alcohol, Ethyl (B): <15 mg/dL (ref <15)

## 2023-11-08 LAB — RESP PANEL BY RT-PCR (RSV, FLU A&B, COVID)  RVPGX2
Influenza A by PCR: NEGATIVE
Influenza B by PCR: NEGATIVE
Resp Syncytial Virus by PCR: NEGATIVE
SARS Coronavirus 2 by RT PCR: NEGATIVE

## 2023-11-08 MED ORDER — ONDANSETRON HCL 4 MG PO TABS
4.0000 mg | ORAL_TABLET | Freq: Four times a day (QID) | ORAL | Status: DC | PRN
Start: 2023-11-08 — End: 2023-11-10

## 2023-11-08 MED ORDER — THIAMINE MONONITRATE 100 MG PO TABS
100.0000 mg | ORAL_TABLET | Freq: Every day | ORAL | Status: DC
  Administered 2023-11-08 – 2023-11-10 (×3): 100 mg via ORAL
  Filled 2023-11-08 (×3): qty 1

## 2023-11-08 MED ORDER — METHYLPREDNISOLONE SODIUM SUCC 125 MG IJ SOLR
125.0000 mg | Freq: Once | INTRAMUSCULAR | Status: AC
  Administered 2023-11-08: 125 mg via INTRAVENOUS
  Filled 2023-11-08: qty 2

## 2023-11-08 MED ORDER — ONDANSETRON HCL 4 MG/2ML IJ SOLN: 4.0000 mg | Freq: Four times a day (QID) | INTRAMUSCULAR | Status: DC | PRN

## 2023-11-08 MED ORDER — ARFORMOTEROL TARTRATE 15 MCG/2ML IN NEBU
15.0000 ug | INHALATION_SOLUTION | Freq: Two times a day (BID) | RESPIRATORY_TRACT | Status: DC
  Administered 2023-11-08 – 2023-11-10 (×4): 15 ug via RESPIRATORY_TRACT
  Filled 2023-11-08 (×5): qty 2

## 2023-11-08 MED ORDER — IPRATROPIUM-ALBUTEROL 0.5-2.5 (3) MG/3ML IN SOLN
3.0000 mL | Freq: Once | RESPIRATORY_TRACT | Status: AC
  Administered 2023-11-08: 3 mL via RESPIRATORY_TRACT
  Filled 2023-11-08: qty 3

## 2023-11-08 MED ORDER — SODIUM CHLORIDE 0.9% FLUSH
3.0000 mL | Freq: Two times a day (BID) | INTRAVENOUS | Status: DC
  Administered 2023-11-08 – 2023-11-10 (×5): 3 mL via INTRAVENOUS

## 2023-11-08 MED ORDER — LORAZEPAM 1 MG PO TABS: 1.0000 mg | ORAL_TABLET | ORAL | Status: DC | PRN

## 2023-11-08 MED ORDER — ALBUTEROL SULFATE (2.5 MG/3ML) 0.083% IN NEBU: 2.5000 mg | INHALATION_SOLUTION | Freq: Four times a day (QID) | RESPIRATORY_TRACT | Status: DC | PRN

## 2023-11-08 MED ORDER — ACETAMINOPHEN 325 MG PO TABS: 650.0000 mg | ORAL_TABLET | Freq: Four times a day (QID) | ORAL | Status: DC | PRN

## 2023-11-08 MED ORDER — ACETAMINOPHEN 650 MG RE SUPP: 650.0000 mg | Freq: Four times a day (QID) | RECTAL | Status: DC | PRN

## 2023-11-08 MED ORDER — THIAMINE HCL 100 MG/ML IJ SOLN
100.0000 mg | Freq: Every day | INTRAMUSCULAR | Status: DC
  Filled 2023-11-08: qty 2

## 2023-11-08 MED ORDER — BUDESONIDE 0.5 MG/2ML IN SUSP
0.5000 mg | Freq: Two times a day (BID) | RESPIRATORY_TRACT | Status: DC
  Administered 2023-11-08 – 2023-11-10 (×4): 0.5 mg via RESPIRATORY_TRACT
  Filled 2023-11-08 (×5): qty 2

## 2023-11-08 MED ORDER — IPRATROPIUM-ALBUTEROL 0.5-2.5 (3) MG/3ML IN SOLN
3.0000 mL | Freq: Four times a day (QID) | RESPIRATORY_TRACT | Status: DC
  Administered 2023-11-08 – 2023-11-09 (×2): 3 mL via RESPIRATORY_TRACT
  Filled 2023-11-08 (×2): qty 3

## 2023-11-08 MED ORDER — ADULT MULTIVITAMIN W/MINERALS CH
1.0000 | ORAL_TABLET | Freq: Every day | ORAL | Status: DC
  Administered 2023-11-08 – 2023-11-10 (×3): 1 via ORAL
  Filled 2023-11-08 (×3): qty 1

## 2023-11-08 MED ORDER — ALBUTEROL SULFATE (2.5 MG/3ML) 0.083% IN NEBU
5.0000 mg | INHALATION_SOLUTION | Freq: Once | RESPIRATORY_TRACT | Status: AC
  Administered 2023-11-08: 5 mg via RESPIRATORY_TRACT
  Filled 2023-11-08: qty 6

## 2023-11-08 MED ORDER — FOLIC ACID 1 MG PO TABS
1.0000 mg | ORAL_TABLET | Freq: Every day | ORAL | Status: DC
  Administered 2023-11-08 – 2023-11-10 (×3): 1 mg via ORAL
  Filled 2023-11-08 (×3): qty 1

## 2023-11-08 MED ORDER — HYDRALAZINE HCL 20 MG/ML IJ SOLN: 10.0000 mg | INTRAMUSCULAR | Status: DC | PRN

## 2023-11-08 MED ORDER — ENOXAPARIN SODIUM 40 MG/0.4ML IJ SOSY
40.0000 mg | PREFILLED_SYRINGE | INTRAMUSCULAR | Status: DC
  Administered 2023-11-08 – 2023-11-09 (×2): 40 mg via SUBCUTANEOUS
  Filled 2023-11-08 (×2): qty 0.4

## 2023-11-08 MED ORDER — PREDNISONE 20 MG PO TABS
40.0000 mg | ORAL_TABLET | Freq: Every day | ORAL | Status: DC
  Administered 2023-11-09 – 2023-11-10 (×2): 40 mg via ORAL
  Filled 2023-11-08 (×2): qty 2

## 2023-11-08 MED ORDER — DOXYCYCLINE HYCLATE 100 MG PO TABS
100.0000 mg | ORAL_TABLET | Freq: Once | ORAL | Status: AC
  Administered 2023-11-08: 100 mg via ORAL
  Filled 2023-11-08: qty 1

## 2023-11-08 MED ORDER — GUAIFENESIN ER 600 MG PO TB12
600.0000 mg | ORAL_TABLET | Freq: Two times a day (BID) | ORAL | Status: DC
  Administered 2023-11-08 – 2023-11-10 (×5): 600 mg via ORAL
  Filled 2023-11-08 (×5): qty 1

## 2023-11-08 NOTE — ED Notes (Signed)
 Pt. Ambulated for 1 full minute and O2 saturations remained at 96% on RA while ambulating. Pt. Stated it was hard to walk and breathe.

## 2023-11-08 NOTE — ED Notes (Signed)
 Nt called CCMD@11 :03am

## 2023-11-08 NOTE — Plan of Care (Signed)
  Problem: Education: Goal: Knowledge of General Education information will improve Description: Including pain rating scale, medication(s)/side effects and non-pharmacologic comfort measures 11/08/2023 1850 by Casimir Keturah LABOR, RN Outcome: Progressing 11/08/2023 1706 by Casimir Keturah LABOR, RN Outcome: Progressing   Problem: Health Behavior/Discharge Planning: Goal: Ability to manage health-related needs will improve 11/08/2023 1850 by Casimir Keturah LABOR, RN Outcome: Progressing 11/08/2023 1706 by Casimir Keturah LABOR, RN Outcome: Progressing   Problem: Clinical Measurements: Goal: Ability to maintain clinical measurements within normal limits will improve 11/08/2023 1850 by Casimir Keturah LABOR, RN Outcome: Progressing 11/08/2023 1706 by Casimir Keturah LABOR, RN Outcome: Progressing Goal: Will remain free from infection 11/08/2023 1850 by Casimir Keturah LABOR, RN Outcome: Progressing 11/08/2023 1706 by Casimir Keturah LABOR, RN Outcome: Progressing Goal: Diagnostic test results will improve 11/08/2023 1850 by Casimir Keturah LABOR, RN Outcome: Progressing 11/08/2023 1706 by Casimir Keturah LABOR, RN Outcome: Progressing Goal: Respiratory complications will improve 11/08/2023 1850 by Casimir Keturah LABOR, RN Outcome: Progressing 11/08/2023 1706 by Casimir Keturah LABOR, RN Outcome: Progressing Goal: Cardiovascular complication will be avoided 11/08/2023 1850 by Casimir Keturah LABOR, RN Outcome: Progressing 11/08/2023 1706 by Casimir Keturah LABOR, RN Outcome: Progressing   Problem: Activity: Goal: Risk for activity intolerance will decrease 11/08/2023 1850 by Casimir Keturah LABOR, RN Outcome: Progressing 11/08/2023 1706 by Casimir Keturah LABOR, RN Outcome: Progressing   Problem: Nutrition: Goal: Adequate nutrition will be maintained 11/08/2023 1850 by Casimir Keturah LABOR, RN Outcome: Progressing 11/08/2023 1706 by Casimir Keturah LABOR, RN Outcome: Progressing   Problem: Coping: Goal: Level of anxiety will  decrease 11/08/2023 1850 by Casimir Keturah LABOR, RN Outcome: Progressing 11/08/2023 1706 by Casimir Keturah LABOR, RN Outcome: Progressing   Problem: Elimination: Goal: Will not experience complications related to bowel motility 11/08/2023 1850 by Casimir Keturah LABOR, RN Outcome: Progressing 11/08/2023 1706 by Casimir Keturah LABOR, RN Outcome: Progressing Goal: Will not experience complications related to urinary retention 11/08/2023 1850 by Casimir Keturah LABOR, RN Outcome: Progressing 11/08/2023 1706 by Casimir Keturah LABOR, RN Outcome: Progressing   Problem: Pain Managment: Goal: General experience of comfort will improve and/or be controlled 11/08/2023 1850 by Casimir Keturah LABOR, RN Outcome: Progressing 11/08/2023 1706 by Casimir Keturah LABOR, RN Outcome: Progressing   Problem: Safety: Goal: Ability to remain free from injury will improve 11/08/2023 1850 by Casimir Keturah LABOR, RN Outcome: Progressing 11/08/2023 1706 by Casimir Keturah LABOR, RN Outcome: Progressing   Problem: Skin Integrity: Goal: Risk for impaired skin integrity will decrease 11/08/2023 1850 by Casimir Keturah LABOR, RN Outcome: Progressing 11/08/2023 1706 by Casimir Keturah LABOR, RN Outcome: Progressing

## 2023-11-08 NOTE — ED Notes (Signed)
 Pt given a sandwich bag, Sprite, and graham crackers x2 w/ EDP permission.

## 2023-11-08 NOTE — ED Notes (Signed)
 Pt getting breathing treatment will check temperature once completed

## 2023-11-08 NOTE — ED Triage Notes (Signed)
 Pt states SHOB since last night. Hx of COPD. Denies chest pain

## 2023-11-08 NOTE — ED Notes (Signed)
 Pt is in x-ray, they said they will bring pt to room.

## 2023-11-08 NOTE — H&P (Addendum)
 History and Physical    Patient: Jason Wong FMW:[id number] DOB: Jun 19, 1955 DOA: 11/08/2023 DOS: the patient was seen and examined on 11/08/2023 PCP: Vicci Barnie NOVAK, MD  Patient coming from: Home  Chief Complaint:  Chief Complaint  Patient presents with   Shortness of Breath   HPI: Jason Wong is a 68 y.o. male with medical history significant of hypertension, hyperlipidemia, COPD, hepatitis C presents with worsening shortness of breath and wheezing.  He experiences increased shortness of breath and wheezing, particularly when getting up and moving around. These symptoms began last night without any specific trigger. He is not currently on oxygen  therapy and has been using the breathing treatments through the nebulizer machine.  He reports not having any inhalers at home with which he can use.  Review of records makes note that patient was unable to get his Trelegy due to the deductible being too high, and it appears they were in the process of trying to get him on Breztri  instead but he has not received that inhaler  He has a history of smoking, currently smoking one cigarette a day, but does not purchase packs. Last night, a family member considered calling an ambulance, but he opted to try a breathing treatment first, which did not alleviate his symptoms, prompting his visit to the hospital today.  No fevers, chills, or productive cough.  In the emergency department patient was noted to be afebrile with mild tachypnea, blood pressures elevated up to 174/112, and all other vital signs maintained.  Labs are ultimately relatively unremarkable.  Chest x-ray noted no acute process.  Patient was given a DuoNeb and Solu-Medrol  125 mg IV.   Review of Systems: As mentioned in the history of present illness. All other systems reviewed and are negative. 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 He has never used smokeless tobacco. He reports current alcohol use of about 2.0 standard drinks of alcohol per week. He reports that he does not currently use drugs after having used the following drugs: Crack cocaine.  Allergies  Allergen Reactions   Tobacco Shortness Of Breath and Other (See Comments)    CIGARETTE SMOKE TRIGGERS THE PATIENT'S RESPIRATORY ISSUES!!    Family History  Problem Relation Age of Onset   Hypertension Mother    Breast cancer Sister    Hypertension Sister    Colon cancer Brother    Colon polyps Neg Hx    Esophageal cancer Neg Hx    Rectal cancer Neg Hx    Stomach cancer Neg Hx     Prior to Admission medications   Medication Sig Start Date End Date Taking? Authorizing Provider  albuterol  (VENTOLIN  HFA) 108 (90 Base) MCG/ACT inhaler Inhale 2 puffs into the lungs every 4 (four) hours as needed for wheezing or shortness of breath. Patient not taking: Reported on 11/08/2023 10/27/23   Grubb, Abigail, DO  arformoterol  (BROVANA ) 15 MCG/2ML NEBU Inhale 2 mLs (15 mcg total) into the lungs 2 (two) times daily. Patient not taking: Reported on 11/08/2023 08/28/23  Vicci Barnie NOVAK, MD  atorvastatin  (LIPITOR) 10 MG tablet Take 1 tablet (10 mg total) by mouth daily. Patient not taking: Reported on 11/08/2023 10/05/23   Raenelle Coria, MD  budesonide  (PULMICORT ) 0.25 MG/2ML nebulizer solution Take 2 mLs (0.25 mg total) by nebulization 2 (two) times daily. Patient not taking: No sig reported 10/05/23   Raenelle Coria, MD  budesonide -glycopyrrolate -formoterol  (BREZTRI  AEROSPHERE) 160-9-4.8 MCG/ACT AERO inhaler Inhale 2 puffs into the lungs 2 (two) times daily. Patient not taking: Reported on  10/16/2023 10/13/23   Vicci Barnie NOVAK, MD  budesonide -glycopyrrolate -formoterol  (BREZTRI  AEROSPHERE) 160-9-4.8 MCG/ACT AERO inhaler Inhale 2 puffs into the lungs 2 (two) times daily. Patient not taking: Reported on 10/16/2023 10/14/23   Vicci Barnie NOVAK, MD  folic acid  (FOLVITE ) 1 MG tablet Take 1 tablet (1 mg total) by mouth daily. Patient not taking: Reported on 10/03/2023 08/26/23   Caleen Burgess BROCKS, MD  ipratropium-albuterol  (DUONEB) 0.5-2.5 (3) MG/3ML SOLN Take 3 mLs by nebulization every 6 (six) hours as needed (shortness of breath , wheezing). Patient not taking: No sig reported 10/05/23   Raenelle Coria, MD  predniSONE  (DELTASONE ) 10 MG tablet Take 20 mg by mouth daily with breakfast. Patient not taking: Reported on 11/08/2023    [provider]    Physical Exam: Vitals:   11/08/23 1300 11/08/23 1315 11/08/23 1330 11/08/23 1345  BP: (!) 146/96 132/88 137/87 129/89  Pulse: 79 80 79 78  Resp: 18 17 18 18   Temp:      SpO2: 96% 96% 94% 96%  Weight:      Height:        Constitutional: Elderly male who appears to be in some mild respiratory distress.   Eyes: PERRL, lids and conjunctivae normal ENMT: Mucous membranes are moist.  Poor dentition. Neck: normal, supple, no JVD present.   Respiratory: Decreased aeration with wheezes appreciated in both lung fields..  Cardiovascular: Regular rate and rhythm, no murmurs / rubs / gallops. No extremity edema. 2+ pedal pulses. No carotid bruits.  Abdomen: no tenderness, no masses palpated. No hepatosplenomegaly. Bowel sounds positive.  Musculoskeletal: no clubbing / cyanosis. No joint deformity upper and lower extremities. Good ROM, no contractures. Normal muscle tone.  Skin: no rashes, lesions, ulcers. No induration Neurologic: CN 2-12 grossly intact. Sensation intact, DTR normal. Strength 5/5 in all 4.  Psychiatric: Normal judgment and insight. Alert and oriented x 3. Normal mood.   Data Reviewed:  Reviewed labs, images, and pertinent  records as documented  Assessment and Plan:  COPD exacerbation Acute on chronic.  Patient presents with worsening shortness of breath and wheezing starting last night.  O2 saturations currently maintained on room air.  Chest x-ray showed no acute abnormality.  UDS positive for cocaine.  Patient had been given Solu-Medrol  125 mg IV and continuous albuterol  neb.  Suspect COPD exacerbation onset by cocaine use. - Admit to a medical telemetry bed - Nasal cannula oxygen  as needed to maintain O2 saturation greater than 90% - Incentive spirometry and flutter valve - DuoNebs 4 times daily as needed - Brovana  and budesonide  nebs - Prednisone  - Transitions of care as patient needs assistance with obtaining inhalers - Mucinex   Hypertension Patient blood pressures initially noted to be elevated at 174/112.  Patient is not on any medications. - Hydralazine  IV as needed  Hyperlipidemia Patient reports not being on any medications, but previously had been on atorvastatin ) time.  Tobacco use Patient still reports smoking 1 cigarette/day on average - Continue to counsel need of cessation  of tobacco use  Cocaine abuse/polysubstance abuse - Continue to counsel need of cessation of cocaine use. - CIWA protocols initiated  DVT prophylaxis: Lovenox  Advance Care Planning:   Code Status: Full Code   Consults: None  Family Communication:   Severity of Illness: The appropriate patient status for this patient is OBSERVATION. Observation status is judged to be reasonable and necessary in order to provide the required intensity of service to ensure the patient's safety. The patient's presenting symptoms, physical exam findings, and initial radiographic and laboratory data in the context of their medical condition is felt to place them at decreased risk for further clinical deterioration. Furthermore, it is anticipated that the patient will be medically stable for discharge from the hospital within 2  midnights of admission.   Author: Maximino DELENA Sharps, MD 11/08/2023 1:54 PM  For on call review [email or web address].

## 2023-11-08 NOTE — ED Provider Notes (Signed)
 [facility] AT [facility] Provider Note   CSN: [id number] Arrival date & time: 11/08/23  9057     Patient presents with: Shortness of Breath   Jason Wong is a 68 y.o. male.   Pt is a 68 yo male with pmhx significant for COPD, HTN, and HLD.  Pt's been using his nebs without improvement in sx.  He was here on 8/8 for the same and was d/c with prednisone .  He felt a little better after the prednisone , but not a lot and sx have continued to worsen.  He denies f/c.  Sob worse with ambulation.  Pt said he's out of his inhalers.  It looks like his pcp tried to pt assistance with Trelegy.  The pharmacy was able to get approval fro Breztri , but pt has not received it yet.       Prior to Admission medications   Medication Sig Start Date End Date Taking? Authorizing Provider  albuterol  (VENTOLIN  HFA) 108 (90 Base) MCG/ACT inhaler Inhale 2 puffs into the lungs every 4 (four) hours as needed for wheezing or shortness of breath. Patient not taking: Reported on 11/08/2023 10/27/23   Grubb, Abigail, DO  arformoterol  (BROVANA ) 15 MCG/2ML NEBU Inhale 2 mLs (15 mcg total) into the lungs 2 (two) times daily. Patient not taking: Reported on 11/08/2023 08/28/23   Vicci Barnie NOVAK, MD  atorvastatin  (LIPITOR) 10 MG tablet Take 1 tablet (10 mg total) by mouth daily. Patient not taking: Reported on 11/08/2023 10/05/23   Raenelle Coria, MD  budesonide  (PULMICORT ) 0.25 MG/2ML nebulizer solution Take 2 mLs (0.25 mg total) by nebulization 2 (two) times daily. Patient not taking: No sig reported 10/05/23   Raenelle Coria, MD  budesonide -glycopyrrolate -formoterol  (BREZTRI  AEROSPHERE) 160-9-4.8 MCG/ACT AERO inhaler Inhale 2 puffs into the lungs 2 (two) times daily. Patient not taking: Reported on 10/16/2023 10/13/23   Vicci Barnie NOVAK, MD  budesonide -glycopyrrolate -formoterol  (BREZTRI  AEROSPHERE) 160-9-4.8 MCG/ACT AERO inhaler Inhale 2 puffs into the lungs 2 (two) times daily. Patient not  taking: Reported on 10/16/2023 10/14/23   Vicci Barnie NOVAK, MD  folic acid  (FOLVITE ) 1 MG tablet Take 1 tablet (1 mg total) by mouth daily. Patient not taking: Reported on 10/03/2023 08/26/23   Caleen Burgess BROCKS, MD  ipratropium-albuterol  (DUONEB) 0.5-2.5 (3) MG/3ML SOLN Take 3 mLs by nebulization every 6 (six) hours as needed (shortness of breath , wheezing). Patient not taking: No sig reported 10/05/23   Raenelle Coria, MD  predniSONE  (DELTASONE ) 10 MG tablet Take 20 mg by mouth daily with breakfast. Patient not taking: Reported on 11/08/2023    [provider]    Allergies: Tobacco    Review of Systems  Respiratory:  Positive for shortness of breath and wheezing.   All other systems reviewed and are negative.   Updated Vital Signs BP 129/89   Pulse 78   Temp 98.1 F (36.7 C)   Resp 18   Ht 5' 7 (1.702 m)   Wt 57 kg   SpO2 96%   BMI 19.68 kg/m   Physical Exam Vitals and nursing note reviewed.  Constitutional:      Appearance: He is well-developed.  HENT:     Head: Normocephalic and atraumatic.     Mouth/Throat:     Mouth: Mucous membranes are moist.     Pharynx: Oropharynx is clear.     Comments: Poor dentition Eyes:     Extraocular Movements: Extraocular movements intact.     Pupils: Pupils are equal, round, and  reactive to light.  Cardiovascular:     Rate and Rhythm: Normal rate and regular rhythm.  Pulmonary:     Effort: Tachypnea present.     Breath sounds: Wheezing present.  Abdominal:     Palpations: Abdomen is soft.  Musculoskeletal:        General: Normal range of motion.     Cervical back: Normal range of motion and neck supple.  Skin:    General: Skin is warm.     Capillary Refill: Capillary refill takes less than 2 seconds.  Neurological:     General: No focal deficit present.     Mental Status: He is alert and oriented to person, place, and time.  Psychiatric:        Mood and Affect: Mood normal.        Behavior: Behavior normal.     (all  labs ordered are listed, but only abnormal results are displayed) Labs Reviewed  BASIC METABOLIC PANEL WITH GFR - Abnormal; Notable for the following components:      Result Value   Anion gap 16 (*)    All other components within normal limits  CBC  URINALYSIS, ROUTINE W REFLEX MICROSCOPIC  RAPID URINE DRUG SCREEN, HOSP PERFORMED  ETHANOL    EKG: EKG Interpretation Date/Time:  Wednesday November 08 2023 10:03:11 EDT Ventricular Rate:  76 PR Interval:  134 QRS Duration:  68 QT Interval:  382 QTC Calculation: 429 R Axis:   83  Text Interpretation: Normal sinus rhythm Normal ECG When compared with ECG of 27-Oct-2023 20:39, PREVIOUS ECG IS PRESENT No significant change since last tracing Confirmed by Dean Clarity [id number]) on 11/08/2023 11:23:00 AM  Radiology: DG Chest 2 View Result Date: 11/08/2023 EXAM: 2 VIEW(S) XRAY OF THE CHEST 11/08/2023 10:41:00 AM COMPARISON: PA and lateral radiographs of the chest dated 10/27/2023. CLINICAL HISTORY: SHOB. Patient states having SOB that began last night (11/07/23) as well as a little bit of a cough. FINDINGS: LUNGS AND PLEURA: No focal pulmonary opacity. No pulmonary edema. No pleural effusion. No pneumothorax. HEART AND MEDIASTINUM: No acute abnormality of the cardiac and mediastinal silhouettes. BONES AND SOFT TISSUES: No acute osseous abnormality. IMPRESSION: 1. No acute process. Electronically signed by: Evalene Coho MD 11/08/2023 11:00 AM EDT RP Workstation: [id number]     Procedures   Medications Ordered in the ED  predniSONE  (DELTASONE ) tablet 40 mg (has no administration in time range)  enoxaparin  (LOVENOX ) injection 40 mg (has no administration in time range)  sodium chloride  flush (NS) 0.9 % injection 3 mL (has no administration in time range)  acetaminophen  (TYLENOL ) tablet 650 mg (has no administration in time range)    Or  acetaminophen  (TYLENOL ) suppository 650 mg (has no administration in time range)  ondansetron  (ZOFRAN )  tablet 4 mg (has no administration in time range)    Or  ondansetron  (ZOFRAN ) injection 4 mg (has no administration in time range)  albuterol  (PROVENTIL ) (2.5 MG/3ML) 0.083% nebulizer solution 2.5 mg (has no administration in time range)  ipratropium-albuterol  (DUONEB) 0.5-2.5 (3) MG/3ML nebulizer solution 3 mL (has no administration in time range)  methylPREDNISolone  sodium succinate (SOLU-MEDROL ) 125 mg/2 mL injection 125 mg (125 mg Intravenous Given 11/08/23 1152)  ipratropium-albuterol  (DUONEB) 0.5-2.5 (3) MG/3ML nebulizer solution 3 mL (3 mLs Nebulization Given 11/08/23 1118)  albuterol  (PROVENTIL ) (2.5 MG/3ML) 0.083% nebulizer solution 5 mg (5 mg Nebulization Given 11/08/23 1117)  albuterol  (PROVENTIL ) (2.5 MG/3ML) 0.083% nebulizer solution 5 mg (5 mg Nebulization Given 11/08/23 1351)  doxycycline  (VIBRA -TABS) tablet  100 mg (100 mg Oral Given 11/08/23 1351)                                    Medical Decision Making Amount and/or Complexity of Data Reviewed Labs: ordered. Radiology: ordered.  Risk Prescription drug management. Decision regarding hospitalization.   This patient presents to the ED for concern of sob, this involves an extensive number of treatment options, and is a complaint that carries with it a high risk of complications and morbidity.  The differential diagnosis includes copd exac, pna, bronchitis   Co morbidities that complicate the patient evaluation  COPD, HTN, and HLD   Additional history obtained:  Additional history obtained from epic chart review  Lab Tests:  I Ordered, and personally interpreted labs.  The pertinent results include:  cbc nl, bmp nl   Imaging Studies ordered:  I ordered imaging studies including cxr  I independently visualized and interpreted imaging which showed   1. No acute process.   I agree with the radiologist interpretation   Cardiac Monitoring:  The patient was maintained on a cardiac monitor.  I personally viewed  and interpreted the cardiac monitored which showed an underlying rhythm of: nsr   Medicines ordered and prescription drug management:  I ordered medication including solumedrol/nebs  for sx  Reevaluation of the patient after these medicines showed that the patient improved I have reviewed the patients home medicines and have made adjustments as needed   Test Considered:  ct   Critical Interventions:  nebs   Consultations Obtained:  I requested consultation with the hospitalist (Dr. Claudene),  and discussed lab and imaging findings as well as pertinent plan -he will admit   Problem List / ED Course:  COPD exacerbation:  pt has improved and he's not hypoxic, however he is still very sob with ambulation.  This is his 3rd visit in the last month.  He does not yet have his breztri  inhaler.  Due to significant sob with ambulation, I think he would benefit from admission.   Reevaluation:  After the interventions noted above, I reevaluated the patient and found that they have :improved   Social Determinants of Health:  Lives at home   Dispostion:  After consideration of the diagnostic results and the patients response to treatment, I feel that the patent would benefit from admission.       Final diagnoses:  COPD exacerbation [facility])    ED Discharge Orders     None          Dean Clarity, MD 11/08/23 1416

## 2023-11-08 NOTE — Plan of Care (Signed)

## 2023-11-09 DIAGNOSIS — J441 Chronic obstructive pulmonary disease with (acute) exacerbation: Secondary | ICD-10-CM | POA: Diagnosis not present

## 2023-11-09 LAB — CBC
HCT: 37.7 % — ABNORMAL LOW (ref 39.0–52.0)
Hemoglobin: 12.9 g/dL — ABNORMAL LOW (ref 13.0–17.0)
MCH: 32.9 pg (ref 26.0–34.0)
MCHC: 34.2 g/dL (ref 30.0–36.0)
MCV: 96.2 fL (ref 80.0–100.0)
Platelets: 272 K/uL (ref 150–400)
RBC: 3.92 MIL/uL — ABNORMAL LOW (ref 4.22–5.81)
RDW: 14.5 % (ref 11.5–15.5)
WBC: 5.9 K/uL (ref 4.0–10.5)
nRBC: 0 % (ref 0.0–0.2)

## 2023-11-09 LAB — BASIC METABOLIC PANEL WITH GFR
Anion gap: 11 (ref 5–15)
BUN: 15 mg/dL (ref 8–23)
CO2: 21 mmol/L — ABNORMAL LOW (ref 22–32)
Calcium: 9 mg/dL (ref 8.9–10.3)
Chloride: 106 mmol/L (ref 98–111)
Creatinine, Ser: 1.06 mg/dL (ref 0.61–1.24)
GFR, Estimated: >60 mL/min (ref >60)
Glucose, Bld: 140 mg/dL — ABNORMAL HIGH (ref 70–99)
Potassium: 4.1 mmol/L (ref 3.5–5.1)
Sodium: 138 mmol/L (ref 135–145)

## 2023-11-09 LAB — MAGNESIUM: Magnesium: 2 mg/dL (ref 1.7–2.4)

## 2023-11-09 MED ORDER — IPRATROPIUM-ALBUTEROL 0.5-2.5 (3) MG/3ML IN SOLN
3.0000 mL | Freq: Three times a day (TID) | RESPIRATORY_TRACT | Status: DC
  Administered 2023-11-09 (×2): 3 mL via RESPIRATORY_TRACT
  Filled 2023-11-09 (×2): qty 3

## 2023-11-09 NOTE — Plan of Care (Signed)
   Problem: Education: Goal: Knowledge of General Education information will improve Description: Including pain rating scale, medication(s)/side effects and non-pharmacologic comfort measures Outcome: Completed/Met

## 2023-11-09 NOTE — Hospital Course (Addendum)
 68 y.o. male with medical history significant of hypertension, hyperlipidemia, COPD, hepatitis C presents with worsening shortness of breath and wheezing mostly on exertion that began on 8/19 night.He was unable to get his Trelegy due to the deductible being too high, and it appears they were in the process of trying to get him on Breztri  instead but he has not received that inhaler.  In the ED-afebrile with mild tachypnea, blood pressures elevated up to 174/112, and all other vital signs maintained.  Labs are ultimately relatively unremarkable.  CXR>no acute process.Patient was given a DuoNeb and Solu-Medrol  125 mg IV and admitted. Treated for COPD exacerbation, at this time clinically improved and plan for discharge home once able to mobilize well on room air  Subjective: Seen and examined Overnight afebrile BP stable labs overall unchanged Remains on room air  Discharge diagnosis:  Acute COPD exacerbation: No PFT available in the system, does have history of smoking, UDS positive for cocaine likely the trigger. Chest x-ray no pneumonia.  Of note he has not been able to get home inhalers-and with the cocaine use likely triggered COPD exacerbation.  At this time he is clinically improved continue current bronchodilator regimen.  Will discharge him inhaler regimen for COPD prescription sent to [facility] pharmacy, TOC has been consulted cont steroid po , resume  breztri .  Hypertension: Initially high, not on meds.  Currently stable  Hyperlipidemia Patient reports not being on any medications, but previously had been on atorvastatin ) time.  He will need to follow-up with PCP   Tobacco use Patient still reports smoking 1 cigarette/day on average.Encourage smoking cessation    Cocaine abuse/polysubstance abuse Continue to counsel need of cessation of cocaine use.CIWA protocols initiated  Mobility: PT Orders: Active PT Follow up Rec: No Pt Follow Up8/21/2025 1041   DVT prophylaxis: enoxaparin   (LOVENOX ) injection 40 mg Start: 11/08/23 2000 Code Status:   Code Status: Full Code Family Communication: plan of care discussed with patient at bedside. Patient status is: Remains hospitalized because of severity of illness Level of care: Telemetry Medical   Dispo: The patient is from: home            Anticipated disposition: home soon Objective: Vitals last 24 hrs: Vitals:   11/09/23 2018 11/09/23 2019 11/09/23 2127 11/10/23 0436  BP:   134/87 (!) 141/106  Pulse:   69 63  Resp:   18 20  Temp:   98.6 F (37 C) 98 F (36.7 C)  TempSrc:   Oral Oral  SpO2: 98% 98% 97% 100%  Weight:      Height:        Physical Examination: General exam: AAOX3 HEENT:Oral mucosa moist, Ear/Nose WNL grossly Respiratory system: B/L diminished breath sounds diffusely, with pursed lip breathing Cardiovascular system: S1 & S2 +, No JVD. Gastrointestinal system: Abdomen soft,NT,ND, BS+ Nervous System: Alert, awake, moving all extremities,and following commands. Extremities: LE edema neg, distal extremities warm.  Skin: No rashes,no icterus. MSK: Normal muscle bulk,tone, power   Medications reviewed:  Scheduled Meds:  arformoterol   15 mcg Nebulization BID   budesonide  (PULMICORT ) nebulizer solution  0.5 mg Nebulization BID   enoxaparin  (LOVENOX ) injection  40 mg Subcutaneous Q24H   folic acid   1 mg Oral Daily   guaiFENesin   600 mg Oral BID   multivitamin with minerals  1 tablet Oral Daily   predniSONE   40 mg Oral Q breakfast   sodium chloride  flush  3 mL Intravenous Q12H   thiamine   100 mg Oral Daily  Continuous Infusions: Diet: Diet Order             Diet general           Diet Heart Room service appropriate? Yes; Fluid consistency: Thin  Diet effective now

## 2023-11-09 NOTE — TOC CAGE-AID Note (Addendum)
 Transition of Care [facility]) - CAGE-AID Screening   Patient Details  Name: Jason Wong MRN: [id number] Date of Birth: 1955-10-18  Transition of Care [facility] CM/SW Contact:    Lendia Dais, LCSWA Phone Number: 11/09/2023, 12:19 PM   Clinical Narrative: CSW informed patient of the consult placed for substance use. CSW asked if the assessment could be done and the patient was agreeable.   Pt declined current cocaine use and stated that they don't do that anymore. Due to the patient answering yes for 2 out 4 questions, CSW offered resources for substance use and the patient declined.    CAGE-AID Screening:    Have You Ever Felt You Ought to Cut Down on Your Drinking or Drug Use?: Yes Have People Annoyed You By Critizing Your Drinking Or Drug Use?: No Have You Felt Bad Or Guilty About Your Drinking Or Drug Use?: Yes Have You Ever Had a Drink or Used Drugs First Thing In The Morning to Steady Your Nerves or to Get Rid of a Hangover?: No CAGE-AID Score: 2  Substance Abuse Education Offered: Yes  Substance abuse interventions: SDOH Screening

## 2023-11-09 NOTE — Progress Notes (Signed)
 PROGRESS NOTE Jason Wong  FMW:[id number] DOB: 04/01/1955 DOA: 11/08/2023 PCP: Vicci Barnie NOVAK, MD  Brief Narrative/Hospital Course: 68 y.o. male with medical history significant of hypertension, hyperlipidemia, COPD, hepatitis C presents with worsening shortness of breath and wheezing mostly on exertion that began on 8/19 night.He was unable to get his Trelegy due to the deductible being too high, and it appears they were in the process of trying to get him on Breztri  instead but he has not received that inhaler.  In the ED-afebrile with mild tachypnea, blood pressures elevated up to 174/112, and all other vital signs maintained.  Labs are ultimately relatively unremarkable.  CXR>no acute process.Patient was given a DuoNeb and Solu-Medrol  125 mg IV and admitted.  Subjective: Seen and examined today Overnight afebrile BP stable on room air Labs overall unremarkable Denies any new complaints But complains of ongoing shortness of breath w/ activity  Assessment and plan:  Acute COPD exacerbation: No PFT available in the system, does have history of smoking, UDS positive for cocaine likely the trigger. Chest x-ray no pneumonia.  Of note he has not been able to get home inhalers. Cont systemic steroids, budesonide , Brovana  and  duoneb nebulizer TOC consult to help with resources for medication at home Monitor additional 24 hours on IV steroid  Hypertension: Initially high, not on meds.  Currently stable  Hyperlipidemia Patient reports not being on any medications, but previously had been on atorvastatin ) time.   Tobacco use Patient still reports smoking 1 cigarette/day on average Encourage smoking cessation    Cocaine abuse/polysubstance abuse Continue to counsel need of cessation of cocaine use.CIWA protocols initiated  Mobility: PT Orders: Active PT Follow up Rec:    DVT prophylaxis: enoxaparin  (LOVENOX ) injection 40 mg Start: 11/08/23 2000 Code Status:   Code Status: Full  Code Family Communication: plan of care discussed with patient at bedside. Patient status is: Remains hospitalized because of severity of illness Level of care: Telemetry Medical   Dispo: The patient is from: home            Anticipated disposition: 1 to 2 days Objective: Vitals last 24 hrs: Vitals:   11/08/23 2027 11/08/23 2049 11/09/23 0525 11/09/23 0745  BP:  (!) 141/95 (!) 130/99   Pulse:  72 73 76  Resp:      Temp:  97.7 F (36.5 C) 98.9 F (37.2 C)   TempSrc:  Oral Oral   SpO2: 96% 96% 97% 96%  Weight:      Height:        Physical Examination: General exam: alert awake, oriented, older than stated age HEENT:Oral mucosa moist, Ear/Nose WNL grossly Respiratory system: Bilaterally diminished breath sounds diffusely, with pursed lip breathing Cardiovascular system: S1 & S2 +, No JVD. Gastrointestinal system: Abdomen soft,NT,ND, BS+ Nervous System: Alert, awake, moving all extremities,and following commands. Extremities: LE edema neg, distal extremities warm.  Skin: No rashes,no icterus. MSK: Normal muscle bulk,tone, power   Medications reviewed:  Scheduled Meds:  arformoterol   15 mcg Nebulization BID   budesonide  (PULMICORT ) nebulizer solution  0.5 mg Nebulization BID   enoxaparin  (LOVENOX ) injection  40 mg Subcutaneous Q24H   folic acid   1 mg Oral Daily   guaiFENesin   600 mg Oral BID   ipratropium-albuterol   3 mL Nebulization TID   multivitamin with minerals  1 tablet Oral Daily   predniSONE   40 mg Oral Q breakfast   sodium chloride  flush  3 mL Intravenous Q12H   thiamine   100 mg Oral Daily  Or   thiamine   100 mg Intravenous Daily   Continuous Infusions: Diet: Diet Order             Diet Heart Room service appropriate? Yes; Fluid consistency: Thin  Diet effective now                    Data Reviewed: I have personally reviewed following labs and imaging studies ( see epic result tab) CBC: Recent Labs  Lab 11/08/23 1016 11/09/23 0355  WBC 6.7 5.9   HGB 14.4 12.9*  HCT 43.0 37.7*  MCV 99.3 96.2  PLT 268 272   CMP: Recent Labs  Lab 11/08/23 1016 11/09/23 0355  NA 144 138  K 4.5 4.1  CL 105 106  CO2 23 21*  GLUCOSE 96 140*  BUN 11 15  CREATININE 0.98 1.06  CALCIUM  9.7 9.0  MG  --  2.0   GFR: Estimated Creatinine Clearance: 53.8 mL/min (by C-G formula based on SCr of 1.06 mg/dL). No results for input(s): AST, ALT, ALKPHOS, BILITOT, PROT, ALBUMIN in the last 168 hours. No results for input(s): LIPASE, AMYLASE in the last 168 hours. No results for input(s): AMMONIA in the last 168 hours. Coagulation Profile: No results for input(s): INR, PROTIME in the last 168 hours. Unresulted Labs (From admission, onward)    None      Antimicrobials/Microbiology: Anti-infectives (From admission, onward)    Start     Dose/Rate Route Frequency Ordered Stop   11/08/23 1345  doxycycline  (VIBRA -TABS) tablet 100 mg        100 mg Oral  Once 11/08/23 1337 11/08/23 1351         Component Value Date/Time   SDES URINE, CATHETERIZED 05/14/2023 0830   SPECREQUEST Normal 05/14/2023 0830   CULT  05/14/2023 0830    NO GROWTH Performed at [facility], [address], [place], [place] [address]    REPTSTATUS 05/15/2023 FINAL 05/14/2023 0830    Procedures:    Mennie LAMY, MD [facility] 11/09/2023, 10:33 AM

## 2023-11-09 NOTE — Progress Notes (Signed)
 Covid, Flu,and Resp. Panel are negative. RN messaged MD on call to make aware and to get an order to remove precautions.   Bari HERO Sidda Humm

## 2023-11-09 NOTE — Plan of Care (Signed)
  Problem: Education: Goal: Knowledge of General Education information will improve Description: Including pain rating scale, medication(s)/side effects and non-pharmacologic comfort measures Outcome: Progressing   Problem: Clinical Measurements: Goal: Ability to maintain clinical measurements within normal limits will improve Outcome: Progressing   Problem: Clinical Measurements: Goal: Respiratory complications will improve Outcome: Progressing   Problem: Nutrition: Goal: Adequate nutrition will be maintained Outcome: Progressing   Problem: Coping: Goal: Level of anxiety will decrease Outcome: Progressing

## 2023-11-09 NOTE — Care Management Obs Status (Signed)
 MEDICARE OBSERVATION STATUS NOTIFICATION   Patient Details  Name: Jason Wong MRN: [id number] Date of Birth: [birth year]   Medicare Observation Status Notification Given:  Yes  Obs letter signed and copy given   Claretta Deed 11/09/2023, 1:15 PM

## 2023-11-09 NOTE — TOC Initial Note (Signed)
 Transition of Care [facility]) - Initial/Assessment Note    Patient Details  Name: Jason Wong MRN: [id number] Date of Birth: Dec 11, 1955  Transition of Care [facility]) CM/SW Contact:    Lendia Dais, LCSWA Phone Number: 11/09/2023, 12:13 PM  Clinical Narrative:  Pt is from home with [employer]. Patients receives income through disability. Patient uses a cane as needed and is able to afford basic needs such as food, housing, and transportation.  CSW asked patient about access to their inhaler. Patient stated that their insurance stopped covering for it and is able to get their other medications. CSW notified RNCM.  ICM will continue to follow.      Expected Discharge Plan: Home/Self Care Barriers to Discharge: Continued Medical Work up   Patient Goals and CMS Choice Patient states their goals for this hospitalization and ongoing recovery are:: To return home and receive inhaler medication   Choice offered to / list presented to : NA      Expected Discharge Plan and Services In-house Referral: Clinical Social Work     Living arrangements for the past 2 months: Apartment                                      Prior Living Arrangements/Services Living arrangements for the past 2 months: Apartment Lives with:: Relatives (Nephew) Patient language and need for interpreter reviewed:: Yes Do you feel safe going back to the place where you live?: Yes      Need for Family Participation in Patient Care: No (Comment) Care giver support system in place?: No (comment) Current home services: DME [facility]) Criminal Activity/Legal Involvement Pertinent to Current Situation/Hospitalization: No - Comment as needed  Activities of Daily Living   ADL Screening (condition at time of admission) Independently performs ADLs?: Yes (appropriate for developmental age) Is the patient deaf or have difficulty hearing?: No Does the patient have difficulty seeing, even when wearing  glasses/contacts?: No Does the patient have difficulty concentrating, remembering, or making decisions?: No  Permission Sought/Granted Permission sought to share information with : Family Supports Permission granted to share information with : Yes, Verbal Permission Granted  Share Information with NAME: Jacobo Moncrief     Permission granted to share info w Relationship: Nephew  Permission granted to share info w Contact Information: [phone]  Emotional Assessment Appearance:: Appears older than stated age Attitude/Demeanor/Rapport: Engaged Affect (typically observed): Appropriate, Pleasant Orientation: : Oriented to Situation, Oriented to Self, Oriented to Place, Oriented to  Time Alcohol / Substance Use: Illicit Drugs, Tobacco Use Psych Involvement: No (comment)  Admission diagnosis:  COPD exacerbation (HCC) [J44.1] Patient Active Problem List   Diagnosis Date Noted   Protein-calorie malnutrition, severe 08/26/2023   SOB (shortness of breath) 08/24/2023   Hematuria 05/17/2023   Acute on chronic respiratory failure with hypoxia (HCC) 05/16/2023   Cystitis 05/14/2023   Influenza A 05/13/2023   Protein-calorie malnutrition (HCC) 05/13/2023   Urinary retention due to benign prostatic hyperplasia 05/13/2023   Acute respiratory failure with hypoxia (HCC) 02/17/2023   HTN (hypertension) 02/17/2023   HLD (hyperlipidemia) 02/17/2023   Tobacco use 02/17/2023   Acute exacerbation of chronic obstructive pulmonary disease (COPD) (HCC) 02/17/2023   Multifocal pneumonia 01/30/2023   COPD with acute exacerbation (HCC) 01/30/2023   COPD with acute exacerbation (HCC) 06/20/2022   Multifocal pneumonia 06/20/2022   Hypokalemia 06/20/2022   Acute respiratory failure with hypoxia (HCC) 06/20/2022   Polysubstance  abuse (HCC) 05/09/2022   On home oxygen  therapy 08/31/2021   Malnutrition of moderate degree 06/16/2021   Acute exacerbation of chronic obstructive pulmonary disease (COPD) (HCC)  06/14/2021   ETOH abuse 05/01/2021   Acute hypoxemic respiratory failure (HCC) 04/30/2021   HLD (hyperlipidemia) 04/30/2021   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, [address] [place] [address] Phone: [phone] Fax: [phone]  [facility] [address] [place] [address] Phone: [phone] Fax: [phone]  [facility] - [facility] [address] [place] [address] Phone: [phone] Fax: [phone]     Social Drivers of Health (SDOH) Social History: SDOH Screenings   Food Insecurity: No Food Insecurity (11/08/2023)  Housing: Low Risk  (11/08/2023)  Transportation Needs: No Transportation Needs (11/08/2023)  Utilities: Not At Risk (11/08/2023)  Depression (PHQ2-9): Low Risk  (10/13/2023)  Recent Concern: Depression (PHQ2-9) - High Risk (07/24/2023)  Financial Resource Strain: Low Risk  (07/24/2023)  Physical Activity: Inactive (07/24/2023)  Social Connections: Unknown (11/08/2023)  Stress: No Stress Concern Present (07/24/2023)  Tobacco Use: High Risk (11/08/2023)  Health Literacy: Adequate Health Literacy (07/24/2023)   SDOH Interventions:     Readmission Risk Interventions    10/03/2023    3:32 PM 05/17/2023    3:43 PM 06/21/2021    2:22 PM  Readmission Risk Prevention Plan  Transportation  Screening Complete Complete Complete  PCP or Specialist Appt within 5-7 Days   Complete  Home Care Screening   Complete  Medication Review (RN CM)   Complete  Medication Review (RN Care Manager) Complete Complete   PCP or Specialist appointment within 3-5 days of discharge Complete Complete   HRI or Home Care Consult Complete Complete   SW Recovery Care/Counseling Consult Complete Complete   Palliative Care Screening Not Applicable Not Applicable   Skilled Nursing Facility Not Applicable

## 2023-11-09 NOTE — Evaluation (Signed)
 Physical Therapy Brief Evaluation and Discharge Note Patient Details Name: Jason Wong MRN: [id number] DOB: January 26, 1956 Today's Date: 11/09/2023   History of Present Illness  Pt is a 68 y.o. M who presents 11/08/2023 with acute COPD exacerbation. UDS positive for cocaine. PMH:  HTN, Asthma, COPD, tobacco abuse, HLD, cocaine abuse , ETOH abuse, hep C  Clinical Impression  Patient evaluated by Physical Therapy with no further acute PT needs identified. Pt continues to report coughing and DOE, however, is mobilizing well. Pt ambulating 200 ft with no assistive device independently. HR 85-92 bpm, SpO2 97-99% on RA. Reviewed IS and flutter valve use; pt pulling 750 cc on IS. Education also provided regarding energy conservation techniques. All education has been completed and the patient has no further questions. No follow-up Physical Therapy or equipment needs. PT is signing off. Thank you for this referral.      PT Assessment Patient does not need any further PT services  Assistance Needed at Discharge  None    Equipment Recommendations None recommended by PT  Recommendations for Other Services       Precautions/Restrictions Precautions Precautions: None Restrictions Weight Bearing Restrictions Per Provider Order: No        Mobility  Bed Mobility   Supine/Sidelying to sit: Independent      Transfers Overall transfer level: Independent Equipment used: None                    Ambulation/Gait Ambulation/Gait assistance: Independent Gait Distance (Feet): 200 Feet Assistive device: None Gait Pattern/deviations: WFL(Within Functional Limits)      Home Activity Instructions    Stairs            Modified Rankin (Stroke Patients Only)        Balance Overall balance assessment: No apparent balance deficits (not formally assessed)                        Pertinent Vitals/Pain PT - Brief Vital Signs All Vital Signs Stable: Yes Pain  Assessment Pain Assessment: No/denies pain     Home Living Family/patient expects to be discharged to:: Private residence Living Arrangements: Other relatives (nephew) Available Help at Discharge: Family Home Environment: Stairs to enter  [place] of Steps: 2 Home Equipment: Cane - single point;Cane - Programmer, applications (2 wheels);Shower seat   Additional Comments: pt states lives with nephew    Prior Function Level of Independence: Independent with assistive device(s) Comments: Uses bus for transportation intermittent use of cane    UE/LE Assessment   UE ROM/Strength/Tone/Coordination: WFL    LE ROM/Strength/Tone/Coordination: WFL      Communication   Communication Communication: No apparent difficulties     Cognition Overall Cognitive Status: Appears within functional limits for tasks assessed/performed       General Comments      Exercises     Assessment/Plan    PT Problem List         PT Visit Diagnosis Difficulty in walking, not elsewhere classified (R26.2)    No Skilled PT All education completed;Patient at baseline level of functioning;Patient is independent with all acitivity/mobility   Co-evaluation                AMPAC 6 Clicks Help needed turning from your back to your side while in a flat bed without using bedrails?: None Help needed moving from lying on your back to sitting on the side of a flat bed without using bedrails?: None Help needed  moving to and from a bed to a chair (including a wheelchair)?: None Help needed standing up from a chair using your arms (e.g., wheelchair or bedside chair)?: None Help needed to walk in hospital room?: None Help needed climbing 3-5 steps with a railing? : None 6 Click Score: 24      End of Session   Activity Tolerance: Patient tolerated treatment well Patient left: in bed;with call bell/phone within reach Nurse Communication: Mobility status PT Visit Diagnosis: Difficulty in walking,  not elsewhere classified (R26.2)     Time: 9062-9053 PT Time Calculation (min) (ACUTE ONLY): 9 min  Charges:   PT Evaluation $PT Eval Low Complexity: 1 Low      Aleck Wong, PT, DPT Acute Rehabilitation Services Office [phone]   Jason Wong  11/09/2023, 10:43 AM

## 2023-11-10 ENCOUNTER — Other Ambulatory Visit (HOSPITAL_COMMUNITY): Payer: Self-pay

## 2023-11-10 ENCOUNTER — Other Ambulatory Visit: Payer: Self-pay

## 2023-11-10 DIAGNOSIS — J441 Chronic obstructive pulmonary disease with (acute) exacerbation: Secondary | ICD-10-CM | POA: Diagnosis not present

## 2023-11-10 MED ORDER — IPRATROPIUM-ALBUTEROL 0.5-2.5 (3) MG/3ML IN SOLN
3.0000 mL | Freq: Four times a day (QID) | RESPIRATORY_TRACT | 0 refills | Status: DC | PRN
  Filled 2023-11-10 (×2): qty 90, 8d supply, fill #0

## 2023-11-10 MED ORDER — BREZTRI AEROSPHERE 160-9-4.8 MCG/ACT IN AERO
2.0000 | INHALATION_SPRAY | Freq: Two times a day (BID) | RESPIRATORY_TRACT | 2 refills | Status: DC
  Filled 2023-11-10: qty 10.7, fill #0
  Filled 2023-11-10: qty 10.7, 30d supply, fill #0

## 2023-11-10 MED ORDER — ATORVASTATIN CALCIUM 10 MG PO TABS
10.0000 mg | ORAL_TABLET | Freq: Every day | ORAL | 0 refills | Status: DC
  Filled 2023-11-10 (×2): qty 30, 30d supply, fill #0

## 2023-11-10 MED ORDER — ALBUTEROL SULFATE HFA 108 (90 BASE) MCG/ACT IN AERS
2.0000 | INHALATION_SPRAY | RESPIRATORY_TRACT | 0 refills | Status: DC | PRN
  Filled 2023-11-10 (×2): qty 18, 25d supply, fill #0

## 2023-11-10 MED ORDER — GUAIFENESIN ER 600 MG PO TB12
600.0000 mg | ORAL_TABLET | Freq: Two times a day (BID) | ORAL | 0 refills | Status: AC
  Filled 2023-11-10 (×2): qty 10, 5d supply, fill #0

## 2023-11-10 MED ORDER — PREDNISONE 10 MG PO TABS
20.0000 mg | ORAL_TABLET | Freq: Every day | ORAL | 0 refills | Status: AC
  Filled 2023-11-10 (×2): qty 10, 5d supply, fill #0

## 2023-11-10 NOTE — Discharge Summary (Signed)
 Physician Discharge Summary  Jason Wong FMW:[id number] DOB: 1955-04-29 DOA: 11/08/2023  PCP: Vicci Barnie NOVAK, MD  Admit date: 11/08/2023 Discharge date: 11/10/2023 Recommendations for Outpatient Follow-up:  Follow up with PCP in 1 weeks-call for appointment Please obtain BMP/CBC in one week  Discharge Dispo: home Discharge Condition: Stable Code Status:   Code Status: Full Code Diet recommendation:  Diet Order             Diet general           Diet Heart Room service appropriate? Yes; Fluid consistency: Thin  Diet effective now                    Brief/Interim Summary: 68 y.o. male with medical history significant of hypertension, hyperlipidemia, COPD, hepatitis C presents with worsening shortness of breath and wheezing mostly on exertion that began on 8/19 night.He was unable to get his Trelegy due to the deductible being too high, and it appears they were in the process of trying to get him on Breztri  instead but he has not received that inhaler.  In the ED-afebrile with mild tachypnea, blood pressures elevated up to 174/112, and all other vital signs maintained.  Labs are ultimately relatively unremarkable.  CXR>no acute process.Patient was given a DuoNeb and Solu-Medrol  125 mg IV and admitted. Treated for COPD exacerbation, at this time clinically improved and plan for discharge home once able to mobilize well on room air  Subjective: Seen and examined Overnight afebrile BP stable labs overall unchanged Remains on room air  Discharge diagnosis:  Acute COPD exacerbation: No PFT available in the system, does have history of smoking, UDS positive for cocaine likely the trigger. Chest x-ray no pneumonia.  Of note he has not been able to get home inhalers-and with the cocaine use likely triggered COPD exacerbation.  At this time he is clinically improved continue current bronchodilator regimen.  Will discharge him inhaler regimen for COPD prescription sent to [facility] pharmacy,  TOC has been consulted cont steroid po , resume  breztri .  Hypertension: Initially high, not on meds.  Currently stable  Hyperlipidemia Patient reports not being on any medications, but previously had been on atorvastatin ) time.  He will need to follow-up with PCP   Tobacco use Patient still reports smoking 1 cigarette/day on average.Encourage smoking cessation    Cocaine abuse/polysubstance abuse Continue to counsel need of cessation of cocaine use.CIWA protocols initiated  Mobility: PT Orders: Active PT Follow up Rec: No Pt Follow Up8/21/2025 1041   DVT prophylaxis: enoxaparin  (LOVENOX ) injection 40 mg Start: 11/08/23 2000 Code Status:   Code Status: Full Code Family Communication: plan of care discussed with patient at bedside. Patient status is: Remains hospitalized because of severity of illness Level of care: Telemetry Medical   Dispo: The patient is from: home            Anticipated disposition: home soon Objective: Vitals last 24 hrs: Vitals:   11/09/23 2018 11/09/23 2019 11/09/23 2127 11/10/23 0436  BP:   134/87 (!) 141/106  Pulse:   69 63  Resp:   18 20  Temp:   98.6 F (37 C) 98 F (36.7 C)  TempSrc:   Oral Oral  SpO2: 98% 98% 97% 100%  Weight:      Height:        Physical Examination: General exam: AAOX3 HEENT:Oral mucosa moist, Ear/Nose WNL grossly Respiratory system: B/L diminished breath sounds diffusely, with pursed lip breathing Cardiovascular system: S1 & S2 +,  No JVD. Gastrointestinal system: Abdomen soft,NT,ND, BS+ Nervous System: Alert, awake, moving all extremities,and following commands. Extremities: LE edema neg, distal extremities warm.  Skin: No rashes,no icterus. MSK: Normal muscle bulk,tone, power   Medications reviewed:  Scheduled Meds:  arformoterol   15 mcg Nebulization BID   budesonide  (PULMICORT ) nebulizer solution  0.5 mg Nebulization BID   enoxaparin  (LOVENOX ) injection  40 mg Subcutaneous Q24H   folic acid   1 mg Oral Daily    guaiFENesin   600 mg Oral BID   multivitamin with minerals  1 tablet Oral Daily   predniSONE   40 mg Oral Q breakfast   sodium chloride  flush  3 mL Intravenous Q12H   thiamine   100 mg Oral Daily   Continuous Infusions: Diet: Diet Order             Diet general           Diet Heart Room service appropriate? Yes; Fluid consistency: Thin  Diet effective now                    Consultation: None  Discharge Instructions  Discharge Instructions     Diet general   Complete by: As directed    Discharge instructions   Complete by: As directed    Please call call MD or return to ER for similar or worsening recurring problem that brought you to hospital or if any fever,nausea/vomiting,abdominal pain, uncontrolled pain, chest pain,  shortness of breath or any other alarming symptoms.  Please follow-up your doctor as instructed in a week time and call the office for appointment.  Please avoid alcohol, smoking, or any other illicit substance and maintain healthy habits including taking your regular medications as prescribed.  You were cared for by a hospitalist during your hospital stay. If you have any questions about your discharge medications or the care you received while you were in the hospital after you are discharged, you can call the unit and ask to speak with the hospitalist on call if the hospitalist that took care of you is not available.  Once you are discharged, your primary care physician will handle any further medical issues. Please note that NO REFILLS for any discharge medications will be authorized once you are discharged, as it is imperative that you return to your primary care physician (or establish a relationship with a primary care physician if you do not have one) for your aftercare needs so that they can reassess your need for medications and monitor your lab values   Increase activity slowly   Complete by: As directed       Allergies as of 11/10/2023        Reactions   Tobacco Shortness Of Breath, Other (See Comments)   CIGARETTE SMOKE TRIGGERS THE PATIENT'S RESPIRATORY ISSUES!!        Medication List     STOP taking these medications    arformoterol  15 MCG/2ML Nebu Commonly known as: Brovana    budesonide  0.25 MG/2ML nebulizer solution Commonly known as: PULMICORT        TAKE these medications    albuterol  108 (90 Base) MCG/ACT inhaler Commonly known as: VENTOLIN  HFA Inhale 2 puffs into the lungs every 4 (four) hours as needed for wheezing or shortness of breath.   atorvastatin  10 MG tablet Commonly known as: LIPITOR Take 1 tablet (10 mg total) by mouth daily.   Breztri  Aerosphere 160-9-4.8 MCG/ACT Aero inhaler Generic drug: budesonide -glycopyrrolate -formoterol  Inhale 2 puffs into the lungs 2 (two) times daily. What  changed: Another medication with the same name was removed. Continue taking this medication, and follow the directions you see here.   folic acid  1 MG tablet Commonly known as: FOLVITE  Take 1 tablet (1 mg total) by mouth daily.   guaiFENesin  600 MG 12 hr tablet Commonly known as: MUCINEX  Take 1 tablet (600 mg total) by mouth 2 (two) times daily for 5 days.   ipratropium-albuterol  0.5-2.5 (3) MG/3ML Soln Commonly known as: DUONEB Take 3 mLs by nebulization every 6 (six) hours as needed (shortness of breath , wheezing).   predniSONE  10 MG tablet Commonly known as: DELTASONE  Take 2 tablets (20 mg total) by mouth daily with breakfast for 5 days.        Follow-up Information     Vicci Barnie NOVAK, MD Follow up in 1 week(s).   Specialty: Internal Medicine Contact information: [address] [place] [address] [place] [address] [phone]                Allergies  Allergen Reactions   Tobacco Shortness Of Breath and Other (See Comments)    CIGARETTE SMOKE TRIGGERS THE PATIENT'S RESPIRATORY ISSUES!!    The results of significant diagnostics from this hospitalization (including imaging,  microbiology, ancillary and laboratory) are listed below for reference.    Microbiology: Recent Results (from the past 240 hours)  Resp panel by RT-PCR (RSV, Flu A&B, Covid) Anterior Nasal Swab     Status: None   Collection Time: 11/08/23  6:03 PM   Specimen: Anterior Nasal Swab  Result Value Ref Range Status   SARS Coronavirus 2 by RT PCR NEGATIVE NEGATIVE Final   Influenza A by PCR NEGATIVE NEGATIVE Final   Influenza B by PCR NEGATIVE NEGATIVE Final    Comment: (NOTE) The Xpert Xpress SARS-CoV-2/FLU/RSV plus assay is intended as an aid in the diagnosis of influenza from Nasopharyngeal swab specimens and should not be used as a sole basis for treatment. Nasal washings and aspirates are unacceptable for Xpert Xpress SARS-CoV-2/FLU/RSV testing.  Fact Sheet for Patients: BloggerCourse.com  Fact Sheet for Healthcare Providers: SeriousBroker.it  This test is not yet approved or cleared by the [place]  FDA and has been authorized for detection and/or diagnosis of SARS-CoV-2 by FDA under an Emergency Use Authorization (EUA). This EUA will remain in effect (meaning this test can be used) for the duration of the COVID-19 declaration under Section 564(b)(1) of the Act, 21 U.S.C. section 360bbb-3(b)(1), unless the authorization is terminated or revoked.     Resp Syncytial Virus by PCR NEGATIVE NEGATIVE Final    Comment: (NOTE) Fact Sheet for Patients: BloggerCourse.com  Fact Sheet for Healthcare Providers: SeriousBroker.it  This test is not yet approved or cleared by the [place]  FDA and has been authorized for detection and/or diagnosis of SARS-CoV-2 by FDA under an Emergency Use Authorization (EUA). This EUA will remain in effect (meaning this test can be used) for the duration of the COVID-19 declaration under Section 564(b)(1) of the Act, 21 U.S.C. section  360bbb-3(b)(1), unless the authorization is terminated or revoked.  Performed at [facility], [address] [place], [place] [address]     Procedures/Studies: DG Chest 2 View Result Date: 11/08/2023 EXAM: 2 VIEW(S) XRAY OF THE CHEST 11/08/2023 10:41:00 AM COMPARISON: PA and lateral radiographs of the chest dated 10/27/2023. CLINICAL HISTORY: SHOB. Patient states having SOB that began last night (11/07/23) as well as a little bit of a cough. FINDINGS: LUNGS AND PLEURA: No focal pulmonary opacity. No pulmonary  edema. No pleural effusion. No pneumothorax. HEART AND MEDIASTINUM: No acute abnormality of the cardiac and mediastinal silhouettes. BONES AND SOFT TISSUES: No acute osseous abnormality. IMPRESSION: 1. No acute process. Electronically signed by: Evalene Coho MD 11/08/2023 11:00 AM EDT RP Workstation: [id number]   DG Chest 2 View Result Date: 10/27/2023 CLINICAL DATA:  Shortness of breath EXAM: CHEST - 2 VIEW COMPARISON:  10/16/2023 FINDINGS: Cardiac shadow is stable. Lungs are mildly hyperinflated. No focal infiltrate or effusion is seen. No bony abnormality is noted. IMPRESSION: Mild hyperinflation.  No other focal abnormality is seen. Electronically Signed   By: Oneil Devonshire M.D.   On: 10/27/2023 21:42   DG Chest 2 View Result Date: 10/16/2023 CLINICAL DATA:  Shortness of breath since yesterday.  Smoker. EXAM: CHEST - 2 VIEW COMPARISON:  10/02/2023 FINDINGS: Normal sized heart. The lungs remain mildly hyperexpanded and clear. Unremarkable bones. IMPRESSION: 1. No acute abnormality. 2. Stable mild changes of COPD. Electronically Signed   By: Elspeth Bathe M.D.   On: 10/16/2023 12:00    Labs: BNP (last 3 results) Recent Labs    05/14/23 0205 08/25/23 0041 10/27/23 2037  BNP 169.0* 46.1 60.7   Basic Metabolic Panel: Recent Labs  Lab 11/08/23 1016 11/09/23 0355  NA 144 138  K 4.5 4.1  CL 105 106  CO2 23 21*  GLUCOSE 96 140*  BUN 11 15  CREATININE 0.98 1.06  CALCIUM   9.7 9.0  MG  --  2.0   Liver Function Tests: No results for input(s): AST, ALT, ALKPHOS, BILITOT, PROT, ALBUMIN in the last 168 hours. No results for input(s): LIPASE, AMYLASE in the last 168 hours. No results for input(s): AMMONIA in the last 168 hours. CBC: Recent Labs  Lab 11/08/23 1016 11/09/23 0355  WBC 6.7 5.9  HGB 14.4 12.9*  HCT 43.0 37.7*  MCV 99.3 96.2  PLT 268 272   Urinalysis    Component Value Date/Time   COLORURINE YELLOW 11/08/2023 1337   APPEARANCEUR CLEAR 11/08/2023 1337   LABSPEC 1.012 11/08/2023 1337   PHURINE 6.0 11/08/2023 1337   GLUCOSEU NEGATIVE 11/08/2023 1337   HGBUR NEGATIVE 11/08/2023 1337   BILIRUBINUR NEGATIVE 11/08/2023 1337   KETONESUR NEGATIVE 11/08/2023 1337   PROTEINUR NEGATIVE 11/08/2023 1337   NITRITE NEGATIVE 11/08/2023 1337   LEUKOCYTESUR NEGATIVE 11/08/2023 1337   Sepsis Labs Recent Labs  Lab 11/08/23 1016 11/09/23 0355  WBC 6.7 5.9   Microbiology Recent Results (from the past 240 hours)  Resp panel by RT-PCR (RSV, Flu A&B, Covid) Anterior Nasal Swab     Status: None   Collection Time: 11/08/23  6:03 PM   Specimen: Anterior Nasal Swab  Result Value Ref Range Status   SARS Coronavirus 2 by RT PCR NEGATIVE NEGATIVE Final   Influenza A by PCR NEGATIVE NEGATIVE Final   Influenza B by PCR NEGATIVE NEGATIVE Final    Comment: (NOTE) The Xpert Xpress SARS-CoV-2/FLU/RSV plus assay is intended as an aid in the diagnosis of influenza from Nasopharyngeal swab specimens and should not be used as a sole basis for treatment. Nasal washings and aspirates are unacceptable for Xpert Xpress SARS-CoV-2/FLU/RSV testing.  Fact Sheet for Patients: BloggerCourse.com  Fact Sheet for Healthcare Providers: SeriousBroker.it  This test is not yet approved or cleared by the [place]  FDA and has been authorized for detection and/or diagnosis of SARS-CoV-2 by FDA under an  Emergency Use Authorization (EUA). This EUA will remain in effect (meaning this test can be used) for the duration of  the COVID-19 declaration under Section 564(b)(1) of the Act, 21 U.S.C. section 360bbb-3(b)(1), unless the authorization is terminated or revoked.     Resp Syncytial Virus by PCR NEGATIVE NEGATIVE Final    Comment: (NOTE) Fact Sheet for Patients: BloggerCourse.com  Fact Sheet for Healthcare Providers: SeriousBroker.it  This test is not yet approved or cleared by the [place]  FDA and has been authorized for detection and/or diagnosis of SARS-CoV-2 by FDA under an Emergency Use Authorization (EUA). This EUA will remain in effect (meaning this test can be used) for the duration of the COVID-19 declaration under Section 564(b)(1) of the Act, 21 U.S.C. section 360bbb-3(b)(1), unless the authorization is terminated or revoked.  Performed at [facility], [address] [place], [place] [address]    Time coordinating discharge: 25 minutes  SIGNED: Mennie LAMY, MD  [facility] 11/10/2023, 10:31 AM  If 7PM-7AM, please contact night-coverage [email or web address]

## 2023-11-10 NOTE — TOC Transition Note (Signed)
 Transition of Care [facility]) - Discharge Note   Patient Details  Name: Jason Wong MRN: [id number] Date of Birth: [birth year]  Transition of [facility] CM/SW Contact:  Tom-Johnson, Harvest Muskrat, RN Phone Number: 11/10/2023, 10:48 AM   Clinical Narrative:     Patient is scheduled for discharge today.  Hospital f/u and discharge instructions on AVS. CM consulted for medication assistance. CM spoke with patient at bedside and he states he ran out of his inhalers and his insurance will not pay for a refill. CM spoke with Diane at [facility] and she states his Albuterol  was not covered at this time because it was filled on 10/30/23 and due for refilled on 08/2 8/25. States Duoneb and Breztin are covered and will be filled today. CM spoke with patient and he voiced non compliance with dosage and how often he uses his inhalers. CM educated patient with RN in the room on the right dose and right time of administering his medications, patient verbalized understanding.   Prescriptions sent to [facility] pharmacy and patient will receive meds prior discharge. Cab voucher will be given to patient at the discharge lounge to transport at discharge.  No further ICM needs noted.       Final next level of care: Home/Self Care Barriers to Discharge: Barriers Resolved   Patient Goals and CMS Choice Patient states their goals for this hospitalization and ongoing recovery are:: To return home and receive inhaler medication CMS Medicare.gov Compare Post Acute Care list provided to:: Patient Choice offered to / list presented to : NA      Discharge Placement                Patient to be transferred to facility by: [facility]      Discharge Plan and Services Additional resources added to the After Visit Summary for   In-house Referral: Clinical Social Work              DME Arranged: N/A DME Agency: NA       HH Arranged: NA HH Agency: NA        Social Drivers of Health (SDOH)  Interventions SDOH Screenings   Food Insecurity: No Food Insecurity (11/08/2023)  Housing: Low Risk  (11/08/2023)  Transportation Needs: No Transportation Needs (11/08/2023)  Utilities: Not At Risk (11/08/2023)  Depression (PHQ2-9): Low Risk  (10/13/2023)  Recent Concern: Depression (PHQ2-9) - High Risk (07/24/2023)  Financial Resource Strain: Low Risk  (07/24/2023)  Physical Activity: Inactive (07/24/2023)  Social Connections: Unknown (11/08/2023)  Stress: No Stress Concern Present (07/24/2023)  Tobacco Use: High Risk (11/08/2023)  Health Literacy: Adequate Health Literacy (07/24/2023)     Readmission Risk Interventions    10/03/2023    3:32 PM 05/17/2023    3:43 PM 06/21/2021    2:22 PM  Readmission Risk Prevention Plan  Transportation Screening Complete Complete Complete  PCP or Specialist Appt within 5-7 Days   Complete  Home Care Screening   Complete  Medication Review (RN CM)   Complete  Medication Review (RN Care Manager) Complete Complete   PCP or Specialist appointment within 3-5 days of discharge Complete Complete   HRI or Home Care Consult Complete Complete   SW Recovery Care/Counseling Consult Complete Complete   Palliative Care Screening Not Applicable Not Applicable   Skilled Nursing Facility Not Applicable

## 2023-11-13 ENCOUNTER — Other Ambulatory Visit: Payer: Self-pay

## 2023-12-01 ENCOUNTER — Encounter: Payer: Self-pay | Admitting: Internal Medicine

## 2023-12-01 ENCOUNTER — Other Ambulatory Visit: Payer: Self-pay

## 2023-12-01 ENCOUNTER — Ambulatory Visit: Attending: Internal Medicine | Admitting: Internal Medicine

## 2023-12-01 VITALS — BP 121/78 | HR 73 | Temp 98.4°F | Ht 67.0 in | Wt 120.0 lb

## 2023-12-01 DIAGNOSIS — Z0000 Encounter for general adult medical examination without abnormal findings: Secondary | ICD-10-CM

## 2023-12-01 DIAGNOSIS — Z28310 Unvaccinated for covid-19: Secondary | ICD-10-CM | POA: Diagnosis not present

## 2023-12-01 DIAGNOSIS — Z125 Encounter for screening for malignant neoplasm of prostate: Secondary | ICD-10-CM

## 2023-12-01 DIAGNOSIS — Z122 Encounter for screening for malignant neoplasm of respiratory organs: Secondary | ICD-10-CM

## 2023-12-01 DIAGNOSIS — Z23 Encounter for immunization: Secondary | ICD-10-CM

## 2023-12-01 DIAGNOSIS — F1910 Other psychoactive substance abuse, uncomplicated: Secondary | ICD-10-CM | POA: Diagnosis not present

## 2023-12-01 DIAGNOSIS — F17200 Nicotine dependence, unspecified, uncomplicated: Secondary | ICD-10-CM

## 2023-12-01 DIAGNOSIS — J432 Centrilobular emphysema: Secondary | ICD-10-CM | POA: Diagnosis not present

## 2023-12-01 DIAGNOSIS — Z1211 Encounter for screening for malignant neoplasm of colon: Secondary | ICD-10-CM

## 2023-12-01 MED ORDER — BREZTRI AEROSPHERE 160-9-4.8 MCG/ACT IN AERO
2.0000 | INHALATION_SPRAY | Freq: Two times a day (BID) | RESPIRATORY_TRACT | 2 refills | Status: DC
  Filled 2023-12-01 (×2): qty 10.7, 30d supply, fill #0

## 2023-12-01 MED ORDER — NICOTINE POLACRILEX 2 MG MT GUM
2.0000 mg | CHEWING_GUM | OROMUCOSAL | 3 refills | Status: DC | PRN
  Filled 2023-12-01: qty 100, fill #0

## 2023-12-01 NOTE — Patient Instructions (Signed)
 We have referred you to get a CAT scan of the chest for lung cancer screening.  You will be called by [facility] imaging to have this done.  Please stop downstairs at the pharmacy today to get your inhaler.  I have sent a prescription to the pharmacy for the nicotine  gum.  If you are unable to use a stool kit today, you can take it home with you but the same day that you use it you should return it to our laboratory.

## 2023-12-01 NOTE — Progress Notes (Signed)
 Subjective:   Jason Wong is a 68 y.o. male who presents for Medicare Annual/Subsequent preventive examination. Pt with hx of HTN, tob dep, seasonal allergies, asthma, centrilobular emphysema, insomnia, coronary atherosclerosis, pre-DM, polysub abuse (ETOH, cocaine, drug over dose accidentally 08/2020)  and hep C treated.    Visit Complete: In person  Patient Medicare AWV questionnaire was completed by the patient on today; I have confirmed that all information answered by patient is correct and no changes since this date.  Cardiac Risk Factors include: advanced age (>47men, >69 women);male gender     Objective:    Today's Vitals   12/01/23 0853  BP: 121/78  Pulse: 73  Temp: 98.4 F (36.9 C)  TempSrc: Oral  SpO2: 97%  Weight: 120 lb (54.4 kg)  Height: 5' 7 (1.702 m)  PainSc: 1    Body mass index is 18.79 kg/m.     12/01/2023    8:55 AM 11/08/2023    4:53 PM 11/08/2023    4:50 PM 10/27/2023    8:37 PM 10/16/2023   10:56 AM 10/03/2023    5:44 PM 09/25/2023    6:29 AM  Advanced Directives  Does Patient Have a Medical Advance Directive? No  No No No No No  Would patient like information on creating a medical advance directive? No - Patient declined No - Patient declined  No - Patient declined       Current Medications (verified) Outpatient Encounter Medications as of 12/01/2023  Medication Sig   nicotine  polacrilex (NICORETTE ) 2 MG gum Take 1 each (2 mg total) by mouth as needed for smoking cessation.   [DISCONTINUED] budesonide -glycopyrrolate -formoterol  (BREZTRI  AEROSPHERE) 160-9-4.8 MCG/ACT AERO inhaler Inhale 2 puffs into the lungs 2 (two) times daily.   albuterol  (VENTOLIN  HFA) 108 (90 Base) MCG/ACT inhaler Inhale 2 puffs into the lungs every 4 (four) hours as needed for wheezing or shortness of breath. (Patient not taking: Reported on 12/01/2023)   atorvastatin  (LIPITOR) 10 MG tablet Take 1 tablet (10 mg total) by mouth daily. (Patient not taking: Reported on 12/01/2023)    budesonide -glycopyrrolate -formoterol  (BREZTRI  AEROSPHERE) 160-9-4.8 MCG/ACT AERO inhaler Inhale 2 puffs into the lungs 2 (two) times daily.   folic acid  (FOLVITE ) 1 MG tablet Take 1 tablet (1 mg total) by mouth daily. (Patient not taking: Reported on 12/01/2023)   ipratropium-albuterol  (DUONEB) 0.5-2.5 (3) MG/3ML SOLN Take 3 mLs by nebulization every 6 (six) hours as needed (shortness of breath , wheezing). (Patient not taking: Reported on 12/01/2023)   No facility-administered encounter medications on file as of 12/01/2023.    Allergies (verified) Tobacco   History: Past Medical History:  Diagnosis Date   Asthma    COPD (chronic obstructive pulmonary disease) (HCC)    Hepatitis C antibody positive in blood    HLD (hyperlipidemia)    HTN (hypertension)    On home oxygen  therapy    per pt 08-31-2021 uses 02 about every other day   Past Surgical History:  Procedure Laterality Date   INCISE AND DRAIN ABCESS     dog bite   Family History  Problem Relation Age of Onset   Hypertension Mother    Breast cancer Sister    Hypertension Sister    Colon cancer Brother    Colon polyps Neg Hx    Esophageal cancer Neg Hx    Rectal cancer Neg Hx    Stomach cancer Neg Hx    Social History   Socioeconomic History   Marital status: Single    Spouse  name: Not on file   Number of children: Not on file   Years of education: Not on file   Highest education level: Not on file  Occupational History   Not on file  Tobacco Use   Smoking status: Every Day    Types: Cigarettes   Smokeless tobacco: Never  Vaping Use   Vaping status: Never Used  Substance and Sexual Activity   Alcohol use: Yes    Alcohol/week: 2.0 standard drinks of alcohol    Types: 2 Cans of beer per week   Drug use: Not Currently    Types: Crack cocaine   Sexual activity: Yes    Partners: Female  Other Topics Concern   Not on file  Social History Narrative   ** Merged History Encounter **       Pt states he is from  home with his nephew. He uses the bus to get to appointments and the grocery store and will be using the bus to get home. He has a nebulizer at home and two metal oxygen  tanks that he uses as needed. He states he doesn't know who fil   ls them, but says they do know who to call when they get empty.   Social Drivers of Corporate investment banker Strain: Low Risk  (12/01/2023)   Overall Financial Resource Strain (CARDIA)    Difficulty of Paying Living Expenses: Not very hard  Food Insecurity: No Food Insecurity (12/01/2023)   Hunger Vital Sign    Worried About Running Out of Food in the Last Year: Never true    Ran Out of Food in the Last Year: Never true  Transportation Needs: No Transportation Needs (12/01/2023)   PRAPARE - Administrator, Civil Service (Medical): No    Lack of Transportation (Non-Medical): No  Physical Activity: Inactive (12/01/2023)   Exercise Vital Sign    Days of Exercise per Week: 0 days    Minutes of Exercise per Session: 0 min  Stress: No Stress Concern Present (12/01/2023)   [employer] of Occupational Health - Occupational Stress Questionnaire    Feeling of Stress: Only a little  Social Connections: Socially Isolated (12/01/2023)   Social Connection and Isolation Panel    Frequency of Communication with Friends and Family: Three times a week    Frequency of Social Gatherings with Friends and Family: Three times a week    Attends Religious Services: Never    Active Member of Clubs or Organizations: No    Attends Banker Meetings: Never    Marital Status: Never married    Tobacco Counseling -still smoking but not every day. Pt vague in giving amount. Would like tor try nicotine  gum -AUD: last drank a beer and shot of liq last night; reports he does not drink every night and can quit. Declines help in quitting -UDS positive for cocaine again on recent hosp.  Reports he is working on quitting  Clinical Intake:     Pain :  0-10 Pain Score: 1  Pain Location: Shoulder Pain Orientation: Left Pain Descriptors / Indicators: Aching Pain Onset: More than a month ago Pain Frequency: Intermittent Pain Relieving Factors: Wait out the pain  Pain Relieving Factors: Wait out the pain  Diabetes: No  How often do you need to have someone help you when you read instructions, pamphlets, or other written materials from your doctor or pharmacy?: 1 - Never What is the last grade level you completed in school?: 12th grade  Interpreter Needed?: No  Information entered by :: Brent Crocker, CMA   Activities of Daily Living    12/01/2023    8:56 AM 11/08/2023    4:49 PM  In your present state of health, do you have any difficulty performing the following activities:  Hearing? 0 0  Vision? 0 0  Difficulty concentrating or making decisions? 0 0  Walking or climbing stairs? 0   Comment Difficulty climing stairs, uses cane to walk   Dressing or bathing? 0   Doing errands, shopping? 0   Preparing Food and eating ? N   Using the Toilet? N   In the past six months, have you accidently leaked urine? N   Do you have problems with loss of bowel control? N   Managing your Medications? N   Managing your Finances? N   Housekeeping or managing your Housekeeping? N     Patient Care Team: Vicci Barnie NOVAK, MD as PCP - General (Internal Medicine) Vicci Barnie NOVAK, MD (Internal Medicine)  Indicate any recent Medical Services you may have received from other than [facility] providers in the past year (date may be approximate).     Assessment:   This is a routine wellness examination for Jason Wong.  Hearing/Vision screen No results found.   Goals Addressed             This Visit's Progress    Quit Smoking       Quit smoking by the end of this year       Depression Screen    12/01/2023    8:55 AM 10/13/2023   11:46 AM 08/28/2023    8:44 AM 07/24/2023    9:39 AM 02/01/2023    9:00 AM 09/13/2022    9:08 AM 06/27/2022     9:37 AM  PHQ 2/9 Scores  PHQ - 2 Score 0 0 0 3 3 0   PHQ- 9 Score 3 1 0 14 3 1    Exception Documentation       Patient refusal   Reports no issues with depression  Fall Risk    12/01/2023    8:54 AM 08/28/2023    8:38 AM 07/24/2023    9:39 AM 02/01/2023    9:00 AM 09/13/2022    9:04 AM  Fall Risk   Falls in the past year? 0 0 1 0 0  Number falls in past yr: 0 0 0 0 0  Injury with Fall? 0 0 0 0 0  Risk for fall due to : No Fall Risks No Fall Risks History of fall(s)  No Fall Risks  Follow up Falls evaluation completed  Falls evaluation completed Falls evaluation completed     MEDICARE RISK AT HOME: Medicare Risk at Home Any stairs in or around the home?: Yes If so, are there any without handrails?: No Home free of loose throw rugs in walkways, pet beds, electrical cords, etc?: Yes Adequate lighting in your home to reduce risk of falls?: No Life alert?: No Use of a cane, walker or w/c?: Yes Grab bars in the bathroom?: No Shower chair or bench in shower?: No Elevated toilet seat or a handicapped toilet?: No  TIMED UP AND GO:  Was the test performed?  Yes  Length of time to ambulate 10 feet: 12 sec Gait slow and steady without use of assistive device    Cognitive Function:    12/01/2023    9:03 AM 10/07/2021    9:17 AM 08/24/2021    9:01 AM  04/28/2020    9:10 AM  MMSE - Mini Mental State Exam  Orientation to time 5 4 4 4   Orientation to Place 5 5 5 5   Registration 3 3 3 3   Attention/ Calculation -- 3 4 0  Attention/Calculation-comments Patient unsure how to spell world back words. Patient unwilling to count backwards in 7's.     Recall 2 2 2 1   Language- name 2 objects 2 2 2 2   Language- repeat 1 1 1 1   Language- follow 3 step command  3 3 3   Language- read & follow direction 1 1 1 1   Write a sentence 1 1 1 1   Copy design 1 0 0 0  Total score  25 26 21         Immunizations Immunization History  Administered Date(s) Administered   Fluad Quad(high Dose 65+)  02/22/2021, 01/04/2022   Fluad Trivalent(High Dose 65+) 01/31/2023, 02/01/2023   Hepatitis A, Adult 08/28/2017   Hepatitis B, ADULT 08/28/2017, 10/03/2017   Influenza, Seasonal, Injecte, Preservative Fre 12/01/2023   Influenza,inj,Quad PF,6+ Mos 01/24/2017, 12/25/2017, 01/03/2019, 01/07/2020   Pneumococcal Conjugate-13 04/28/2020   Pneumococcal Polysaccharide-23 07/22/2021   Tdap 07/17/2015   Zoster Recombinant(Shingrix ) 09/09/2020, 08/10/2021    TDAP status: Up to date  Flu Vaccine status: Completed at today's visit  Pneumococcal vaccine status: Up to date  Covid-19 vaccine status: Declined, Education has been provided regarding the importance of this vaccine but patient still declined. Advised may receive this vaccine at local pharmacy or Health Dept.or vaccine clinic. Aware to provide a copy of the vaccination record if obtained from local pharmacy or Health Dept. Verbalized acceptance and understanding.  Qualifies for Shingles Vaccine? Yes   Zostavax completed No   Shingrix  Completed?: Yes  Screening Tests Health Maintenance  Topic Date Due   Colonoscopy  10/26/2020   COVID-19 Vaccine (1 - 2024-25 season) Never done   Medicare Annual Wellness (AWV)  11/30/2024   DTaP/Tdap/Td (2 - Td or Tdap) 07/16/2025   Pneumococcal Vaccine: 50+ Years  Completed   Influenza Vaccine  Completed   Hepatitis C Screening  Completed   Zoster Vaccines- Shingrix   Completed   HPV VACCINES  Aged Out   Meningococcal B Vaccine  Aged Out   Hepatitis B Vaccines 19-59 Average Risk  Discontinued    Health Maintenance  Health Maintenance Due  Topic Date Due   Colonoscopy  10/26/2020   COVID-19 Vaccine (1 - 2024-25 season) Never done    Colon CA screen: declines c-scope. Agrees to FIT  Lung Cancer Screening: (Low Dose CT Chest recommended if Age 1-80 years, 20 pack-year currently smoking OR have quit w/in 15years.) does qualify. Smoked since age 11; was 1 pk/day smoker for >30 yrs; just started  cutting back over the past 1 year  Lung Cancer Screening Referral: referral submitted  Additional Screening:  Hepatitis C Screening: does qualify; Completed 2021  Vision Screening: Recommended annual ophthalmology exams for early detection of glaucoma and other disorders of the eye. Is the patient up to date with their annual eye exam?  No  Who is the provider or what is the name of the office in which the patient attends annual eye exams? Has appt in November If pt is not established with a provider, would they like to be referred to a provider to establish care? NA.   Dental Screening: Recommended annual dental exams for proper oral hygiene   Community Resource Referral / Chronic Care Management: CRR required this visit?  No  CCM required this visit?  No     Plan:    1. Encounter for Medicare annual wellness exam (Primary)  2. Tobacco dependence Strongly advised to quit.  He is wanting to try the nicotine  gum - nicotine  polacrilex (NICORETTE ) 2 MG gum; Take 1 each (2 mg total) by mouth as needed for smoking cessation.  Dispense: 100 tablet; Refill: 3  3. Polysubstance abuse (HCC) Strongly advised him to quit.  He declines referral for treatment program  4. Need for influenza vaccination -High-dose flu vaccine is not available today.  He is agreeable to receiving the regular dose instead. - Flu vaccine trivalent PF, 6mos and older(Flulaval,Afluria,Fluarix,Fluzone)  5. COVID-19 vaccine series declined   6. Screening for colon cancer - Fecal occult blood, imunochemical([facility]/Sun[facility]) - CT CHEST LUNG CA SCREEN LOW DOSE W/O CM; Future  7. Screening for lung cancer - CT CHEST LUNG CA SCREEN LOW DOSE W/O CM; Future  8. Centrilobular emphysema (HCC) Pt requested RF on Breztri  - budesonide -glycopyrrolate -formoterol  (BREZTRI  AEROSPHERE) 160-9-4.8 MCG/ACT AERO inhaler; Inhale 2 puffs into the lungs 2 (two) times daily.  Dispense: 10.7 g; Refill: 2  9. Prostate cancer  screening Agrees to screening - PSA  I have personally reviewed and noted the following in the patient's chart:   Medical and social history Use of alcohol, tobacco or illicit drugs  Current medications and supplements including opioid prescriptions. Patient is not currently taking opioid prescriptions. Functional ability and status Nutritional status Physical activity Advanced directives List of other physicians Hospitalizations, surgeries, and ER visits in previous 12 months Vitals Screenings to include cognitive, depression, and falls Referrals and appointments  In addition, I have reviewed and discussed with patient certain preventive protocols, quality metrics, and best practice recommendations. A written personalized care plan for preventive services as well as general preventive health recommendations were provided to patient.     Barnie Louder, MD   12/01/2023   After Visit Summary: (In Person-Printed) AVS printed and given to the patient

## 2023-12-02 ENCOUNTER — Ambulatory Visit: Payer: Self-pay | Admitting: Internal Medicine

## 2023-12-02 LAB — PSA: Prostate Specific Ag, Serum: 0.2 ng/mL (ref 0.0–4.0)

## 2023-12-12 ENCOUNTER — Other Ambulatory Visit: Payer: Self-pay

## 2023-12-12 ENCOUNTER — Emergency Department (HOSPITAL_COMMUNITY)
Admission: EM | Admit: 2023-12-12 | Discharge: 2023-12-13 | Disposition: A | Discharge status: Home / Self Care | Attending: Emergency Medicine | Admitting: Emergency Medicine

## 2023-12-12 ENCOUNTER — Emergency Department (HOSPITAL_COMMUNITY)

## 2023-12-12 ENCOUNTER — Encounter (HOSPITAL_COMMUNITY): Payer: Self-pay

## 2023-12-12 DIAGNOSIS — Z7951 Long term (current) use of inhaled steroids: Secondary | ICD-10-CM | POA: Diagnosis not present

## 2023-12-12 DIAGNOSIS — R0602 Shortness of breath: Secondary | ICD-10-CM | POA: Diagnosis not present

## 2023-12-12 DIAGNOSIS — R739 Hyperglycemia, unspecified: Secondary | ICD-10-CM | POA: Insufficient documentation

## 2023-12-12 DIAGNOSIS — J441 Chronic obstructive pulmonary disease with (acute) exacerbation: Secondary | ICD-10-CM | POA: Diagnosis not present

## 2023-12-12 DIAGNOSIS — R062 Wheezing: Secondary | ICD-10-CM | POA: Diagnosis not present

## 2023-12-12 LAB — COMPREHENSIVE METABOLIC PANEL WITH GFR
ALT: 14 U/L (ref 0–44)
AST: 23 U/L (ref 15–41)
Albumin: 4 g/dL (ref 3.5–5.0)
Alkaline Phosphatase: 67 U/L (ref 38–126)
Anion gap: 12 (ref 5–15)
BUN: 11 mg/dL (ref 8–23)
CO2: 21 mmol/L — ABNORMAL LOW (ref 22–32)
Calcium: 9 mg/dL (ref 8.9–10.3)
Chloride: 111 mmol/L (ref 98–111)
Creatinine, Ser: 0.95 mg/dL (ref 0.61–1.24)
GFR, Estimated: >60 mL/min (ref >60)
Glucose, Bld: 130 mg/dL — ABNORMAL HIGH (ref 70–99)
Potassium: 3.5 mmol/L (ref 3.5–5.1)
Sodium: 144 mmol/L (ref 135–145)
Total Bilirubin: 0.7 mg/dL (ref 0.0–1.2)
Total Protein: 6.8 g/dL (ref 6.5–8.1)

## 2023-12-12 LAB — CBC WITH DIFFERENTIAL/PLATELET
Abs Immature Granulocytes: 0.01 K/uL (ref 0.00–0.07)
Basophils Absolute: 0.1 K/uL (ref 0.0–0.1)
Basophils Relative: 1 %
Eosinophils Absolute: 0 K/uL (ref 0.0–0.5)
Eosinophils Relative: 1 %
HCT: 35.3 % — ABNORMAL LOW (ref 39.0–52.0)
Hemoglobin: 11.7 g/dL — ABNORMAL LOW (ref 13.0–17.0)
Immature Granulocytes: 0 %
Lymphocytes Relative: 47 %
Lymphs Abs: 2.9 K/uL (ref 0.7–4.0)
MCH: 34.1 pg — ABNORMAL HIGH (ref 26.0–34.0)
MCHC: 33.1 g/dL (ref 30.0–36.0)
MCV: 102.9 fL — ABNORMAL HIGH (ref 80.0–100.0)
Monocytes Absolute: 0.5 K/uL (ref 0.1–1.0)
Monocytes Relative: 8 %
Neutro Abs: 2.6 K/uL (ref 1.7–7.7)
Neutrophils Relative %: 43 %
Platelets: 386 K/uL (ref 150–400)
RBC: 3.43 MIL/uL — ABNORMAL LOW (ref 4.22–5.81)
RDW: 14.7 % (ref 11.5–15.5)
WBC: 6.1 K/uL (ref 4.0–10.5)
nRBC: 0 % (ref 0.0–0.2)

## 2023-12-12 LAB — BRAIN NATRIURETIC PEPTIDE: B Natriuretic Peptide: 170 pg/mL — ABNORMAL HIGH (ref 0.0–100.0)

## 2023-12-12 LAB — TROPONIN I (HIGH SENSITIVITY): Troponin I (High Sensitivity): 7 ng/L (ref <18)

## 2023-12-12 MED ORDER — IPRATROPIUM-ALBUTEROL 0.5-2.5 (3) MG/3ML IN SOLN
3.0000 mL | Freq: Once | RESPIRATORY_TRACT | Status: AC
Start: 2023-12-12 — End: 2023-12-13
  Administered 2023-12-13: 3 mL via RESPIRATORY_TRACT
  Filled 2023-12-12: qty 3

## 2023-12-12 NOTE — ED Triage Notes (Signed)
 Pt BIB [facility] from home c/o COPD exacerbation. Pt is c/o SOB. Pt is currently on 2nd duoneb treatment.   2g magnesium  and 1mg  of Epi given en route. 170/110 HR 110 100% non rebreather

## 2023-12-12 NOTE — ED Provider Notes (Incomplete)
 [place] EMERGENCY DEPARTMENT AT [facility] Provider Note   CSN: [id number] Arrival date & time: 12/12/23  2227     Patient presents with: Shortness of Breath   Jason Wong is a 68 y.o. male with history of COPD, presents with concern for shortness of breath that began after smoking a cigarette earlier today.  He reports that this seemed to exacerbate his COPD.  He denies any chest pain.  Denies any lower extremity pain or swelling.  He was given 2 g of magnesium  and 1mg  epinephrine and route with EMS.  {Add pertinent medical, surgical, social history, OB history to HPI:32947}  Shortness of Breath      Prior to Admission medications   Medication Sig Start Date End Date Taking? Authorizing Provider  albuterol  (VENTOLIN  HFA) 108 (90 Base) MCG/ACT inhaler Inhale 2 puffs into the lungs every 4 (four) hours as needed for wheezing or shortness of breath. Patient not taking: Reported on 12/01/2023 11/10/23   Christobal Guadalajara, MD  atorvastatin  (LIPITOR) 10 MG tablet Take 1 tablet (10 mg total) by mouth daily. Patient not taking: Reported on 12/01/2023 11/10/23 12/10/23  Christobal Guadalajara, MD  budesonide -glycopyrrolate -formoterol  (BREZTRI  AEROSPHERE) 160-9-4.8 MCG/ACT AERO inhaler Inhale 2 puffs into the lungs 2 (two) times daily. 12/01/23   Vicci Barnie NOVAK, MD  folic acid  (FOLVITE ) 1 MG tablet Take 1 tablet (1 mg total) by mouth daily. Patient not taking: Reported on 12/01/2023 08/26/23   Caleen Burgess BROCKS, MD  ipratropium-albuterol  (DUONEB) 0.5-2.5 (3) MG/3ML SOLN Take 3 mLs by nebulization every 6 (six) hours as needed (shortness of breath , wheezing). Patient not taking: Reported on 12/01/2023 11/10/23   Christobal Guadalajara, MD  nicotine  polacrilex (NICORETTE ) 2 MG gum Take 1 each (2 mg total) by mouth as needed for smoking cessation. 12/01/23   Vicci Barnie NOVAK, MD    Allergies: Tobacco    Review of Systems  Respiratory:  Positive for shortness of breath.     Updated Vital Signs Ht 5' 7 (1.702 m)    Wt 54.4 kg   BMI 18.79 kg/m   Physical Exam Vitals and nursing note reviewed.  Constitutional:      General: He is not in acute distress.    Appearance: He is well-developed.  HENT:     Head: Normocephalic and atraumatic.  Eyes:     Conjunctiva/sclera: Conjunctivae normal.  Cardiovascular:     Rate and Rhythm: Normal rate and regular rhythm.     Heart sounds: No murmur heard. Pulmonary:     Effort: Pulmonary effort is normal. No respiratory distress.     Breath sounds: Normal breath sounds.     Comments: Wheezing in the bilateral lungs Abdominal:     Palpations: Abdomen is soft.     Tenderness: There is no abdominal tenderness.  Musculoskeletal:        General: No swelling.     Cervical back: Neck supple.     Comments: No edema of the bilateral lower extremities.  No calf tenderness to palpation  Skin:    General: Skin is warm and dry.     Capillary Refill: Capillary refill takes less than 2 seconds.  Neurological:     Mental Status: He is alert.  Psychiatric:        Mood and Affect: Mood normal.     (all labs ordered are listed, but only abnormal results are displayed) Labs Reviewed  CBC WITH DIFFERENTIAL/PLATELET - Abnormal; Notable for the following components:  Result Value   RBC 3.43 (*)    Hemoglobin 11.7 (*)    HCT 35.3 (*)    MCV 102.9 (*)    MCH 34.1 (*)    All other components within normal limits  COMPREHENSIVE METABOLIC PANEL WITH GFR  BRAIN NATRIURETIC PEPTIDE  TROPONIN I (HIGH SENSITIVITY)    EKG: None  Radiology: DG Chest 2 View Result Date: 12/12/2023 CLINICAL DATA:  Shortness of breath EXAM: CHEST - 2 VIEW COMPARISON:  11/08/2023 FINDINGS: Heart size and pulmonary vascularity are normal. Lungs are clear. No pleural effusion or pneumothorax. Mediastinal contours appear intact. No significant change since prior study. IMPRESSION: No active cardiopulmonary disease. Electronically Signed   By: Elsie Gravely M.D.   On: 12/12/2023 23:02     {Document cardiac monitor, telemetry assessment procedure when appropriate:32947} Procedures   Medications Ordered in the ED - No data to display  Clinical Course as of 12/12/23 2356  Tue Dec 12, 2023  2354 Upon reevaluation, patient reports his shortness of breath has resolved with the DuoNeb treatments.  Wheezing in the lungs has improved. [AF]    Clinical Course User Index [AF] Veta Palma, PA-C   {Click here for ABCD2, HEART and other calculators REFRESH Note before signing:1}                              Medical Decision Making Amount and/or Complexity of Data Reviewed Labs: ordered. Radiology: ordered.   ***  {Document critical care time when appropriate  Document review of labs and clinical decision tools ie CHADS2VASC2, etc  Document your independent review of radiology images and any outside records  Document your discussion with family members, caretakers and with consultants  Document social determinants of health affecting pt's care  Document your decision making why or why not admission, treatments were needed:32947:::1}   Final diagnoses:  None    ED Discharge Orders     None

## 2023-12-12 NOTE — ED Provider Notes (Signed)
 [place] EMERGENCY DEPARTMENT AT [facility] Provider Note   CSN: [id number] Arrival date & time: 12/12/23  2227     Patient presents with: Shortness of Breath   Jason Wong is a 68 y.o. male with history of COPD, presents with concern for shortness of breath that began after smoking a cigarette earlier today.  He reports that this seemed to exacerbate his COPD.  He denies any chest pain, fevers, cough.  Denies any lower extremity pain or swelling.  He was given 2 g of magnesium  and 1mg  epinephrine and route with EMS.    Shortness of Breath      Prior to Admission medications   Medication Sig Start Date End Date Taking? Authorizing Provider  predniSONE  (DELTASONE ) 10 MG tablet Take 4 tablets (40 mg total) by mouth daily with breakfast for 1 day, THEN 3 tablets (30 mg total) daily with breakfast for 2 days, THEN 2 tablets (20 mg total) daily with breakfast for 2 days, THEN 1 tablet (10 mg total) daily with breakfast for 1 day. 12/13/23 12/19/23 Yes Veta Palma, PA-C  albuterol  (VENTOLIN  HFA) 108 (90 Base) MCG/ACT inhaler Inhale 2 puffs into the lungs every 4 (four) hours as needed for wheezing or shortness of breath. 12/13/23   Veta Palma, PA-C  atorvastatin  (LIPITOR) 10 MG tablet Take 1 tablet (10 mg total) by mouth daily. Patient not taking: Reported on 12/01/2023 11/10/23 12/10/23  Christobal Guadalajara, MD  budesonide -glycopyrrolate -formoterol  (BREZTRI  AEROSPHERE) 160-9-4.8 MCG/ACT AERO inhaler Inhale 2 puffs into the lungs 2 (two) times daily. 12/01/23   Vicci Barnie NOVAK, MD  folic acid  (FOLVITE ) 1 MG tablet Take 1 tablet (1 mg total) by mouth daily. Patient not taking: Reported on 12/01/2023 08/26/23   Caleen Burgess BROCKS, MD  ipratropium-albuterol  (DUONEB) 0.5-2.5 (3) MG/3ML SOLN Take 3 mLs by nebulization every 6 (six) hours as needed (shortness of breath , wheezing). Patient not taking: Reported on 12/01/2023 11/10/23   Christobal Guadalajara, MD  nicotine  polacrilex (NICORETTE ) 2 MG gum  Take 1 each (2 mg total) by mouth as needed for smoking cessation. 12/01/23   Vicci Barnie NOVAK, MD    Allergies: Tobacco    Review of Systems  Respiratory:  Positive for shortness of breath.     Updated Vital Signs BP (!) 150/81 (BP Location: Left Arm)   Pulse 90   Temp 98.7 F (37.1 C) (Oral)   Resp 18   Ht 5' 7 (1.702 m)   Wt 54.4 kg   SpO2 98%   BMI 18.79 kg/m   Physical Exam Vitals and nursing note reviewed.  Constitutional:      General: He is not in acute distress.    Appearance: He is well-developed.  HENT:     Head: Normocephalic and atraumatic.  Eyes:     Conjunctiva/sclera: Conjunctivae normal.  Cardiovascular:     Rate and Rhythm: Normal rate and regular rhythm.     Heart sounds: No murmur heard. Pulmonary:     Effort: Pulmonary effort is normal. No respiratory distress.     Breath sounds: Normal breath sounds.     Comments: Wheezing in the bilateral lungs Abdominal:     Palpations: Abdomen is soft.     Tenderness: There is no abdominal tenderness.  Musculoskeletal:        General: No swelling.     Cervical back: Neck supple.     Comments: No edema of the bilateral lower extremities.  No calf tenderness to palpation  Skin:  General: Skin is warm and dry.     Capillary Refill: Capillary refill takes less than 2 seconds.  Neurological:     Mental Status: He is alert.  Psychiatric:        Mood and Affect: Mood normal.     (all labs ordered are listed, but only abnormal results are displayed) Labs Reviewed  CBC WITH DIFFERENTIAL/PLATELET - Abnormal; Notable for the following components:      Result Value   RBC 3.43 (*)    Hemoglobin 11.7 (*)    HCT 35.3 (*)    MCV 102.9 (*)    MCH 34.1 (*)    All other components within normal limits  COMPREHENSIVE METABOLIC PANEL WITH GFR - Abnormal; Notable for the following components:   CO2 21 (*)    Glucose, Bld 130 (*)    All other components within normal limits  BRAIN NATRIURETIC PEPTIDE -  Abnormal; Notable for the following components:   B Natriuretic Peptide 170.0 (*)    All other components within normal limits  D-DIMER, QUANTITATIVE - Abnormal; Notable for the following components:   D-Dimer, Quant 0.53 (*)    All other components within normal limits  TROPONIN I (HIGH SENSITIVITY)  TROPONIN I (HIGH SENSITIVITY)    EKG: None  Radiology: DG Chest 2 View Result Date: 12/12/2023 CLINICAL DATA:  Shortness of breath EXAM: CHEST - 2 VIEW COMPARISON:  11/08/2023 FINDINGS: Heart size and pulmonary vascularity are normal. Lungs are clear. No pleural effusion or pneumothorax. Mediastinal contours appear intact. No significant change since prior study. IMPRESSION: No active cardiopulmonary disease. Electronically Signed   By: Elsie Gravely M.D.   On: 12/12/2023 23:02     Procedures   Medications Ordered in the ED  ipratropium-albuterol  (DUONEB) 0.5-2.5 (3) MG/3ML nebulizer solution 3 mL (3 mLs Nebulization Given 12/13/23 0218)  dexamethasone  (DECADRON ) injection 10 mg (10 mg Intravenous Given 12/13/23 0220)    Clinical Course as of 12/13/23 0350  Tue Dec 12, 2023  2354 Upon reevaluation, patient reports his shortness of breath has resolved with the DuoNeb treatments.  Wheezing in the lungs has improved. [AF]    Clinical Course User Index [AF] Veta Palma, PA-C                                 Medical Decision Making Amount and/or Complexity of Data Reviewed Labs: ordered. Radiology: ordered.  Risk Prescription drug management.     Differential diagnosis includes but is not limited to COPD exacerbation, asthma, DVT, PE, pneumonia, pleural effusion, ACS, arrhythmia  ED Course:  Upon initial evaluation, patient is well-appearing, no acute distress.  Stable vitals.  He has wheezing in the bilateral lungs.  Slightly diminished breath sounds in the left lower lung.  He does not have any accessory muscle use.  Talking in full sentences on room air without  difficulty.  He is maintaining at 100% oxygen  saturation on room air.  He did receive 2 g magnesium  and 1 mg epinephrine, and a DuoNeb prior to arrival.  He does not have any chest pain.  No lower extremity pain or edema.  Labs Ordered: I Ordered, and personally interpreted labs.  The pertinent results include:   CBC without leukocytosis.  Hemoglobin slightly low at 11.7, this is down from 12.76-month ago. CMP with mildly elevated glucose at 130.  Otherwise unremarkable Initial troponin at 7, repeat 9 BMP elevated at 170 D-dimer 0.53, within normal  limits for age  Imaging Studies ordered: I ordered imaging studies including chest x-ray I independently visualized the imaging with scope of interpretation limited to determining acute life threatening conditions related to emergency care. Imaging showed no acute abnormalities I agree with the radiologist interpretation   Cardiac Monitoring: / EKG: The patient was maintained on a cardiac monitor.  I personally viewed and interpreted the cardiac monitored which showed an underlying rhythm of: Normal sinus rhythm   Medications Given: DuoNeb 1 g epinephrine, 2g Magnesium  given prior to arrival  Upon re-evaluation, patient remains well-appearing with stable vitals.  He feels improved after DuoNeb treatments and medications given here today.  Wheezing has improved after DuoNeb treatments, Decadron , and magnesium  given.  Symptoms and exam seem consistent with COPD exacerbation.  Low concern for heart failure given BNP only mildly elevated at 170, he does not have any lower extremity edema, no evidence of pulmonary edema on chest x-ray.  Low concern for ACS at this time given troponin remains stable with initial troponin of 7 and repeat of 9, no chest pain, and EKG with normal sinus rhythm and no ST changes. Chest x-ray without any acute abnormality.  Low concern for DVT or PE at this time given D-dimer within normal limits when age-adjusted.  Patient  stable and appropriate for discharge home   Impression: COPD exacerbation  Disposition:  The patient was discharged home with instructions to take course of prednisone  as prescribed.  Follow-up with his PCP within the next week for reevaluation of his symptoms and further management.  He understands his blood pressure was elevated today, and his hemoglobin was slightly low, and he needs to address these with his PCP as well. Return precautions given.     This chart was dictated using voice recognition software, Dragon. Despite the best efforts of this provider to proofread and correct errors, errors may still occur which can change documentation meaning.       Final diagnoses:  COPD exacerbation [facility])    ED Discharge Orders          Ordered    predniSONE  (DELTASONE ) 10 MG tablet  Q breakfast        12/13/23 0345    albuterol  (VENTOLIN  HFA) 108 (90 Base) MCG/ACT inhaler  Every 4 hours PRN       Note to Pharmacy: 1 each to package size   12/13/23 0345               Veta Palma, PA-C 12/13/23 0350    Mesner, Selinda, MD 12/13/23 [id number]

## 2023-12-13 ENCOUNTER — Other Ambulatory Visit: Payer: Self-pay

## 2023-12-13 LAB — TROPONIN I (HIGH SENSITIVITY): Troponin I (High Sensitivity): 9 ng/L (ref <18)

## 2023-12-13 LAB — D-DIMER, QUANTITATIVE: D-Dimer, Quant: 0.53 ug{FEU}/mL — ABNORMAL HIGH (ref 0.00–0.50)

## 2023-12-13 MED ORDER — ALBUTEROL SULFATE HFA 108 (90 BASE) MCG/ACT IN AERS
2.0000 | INHALATION_SPRAY | RESPIRATORY_TRACT | 0 refills | Status: DC | PRN
Start: 2023-12-13 — End: 2023-12-22
  Filled 2023-12-13: qty 18, 17d supply, fill #0

## 2023-12-13 MED ORDER — DEXAMETHASONE SODIUM PHOSPHATE 10 MG/ML IJ SOLN
10.0000 mg | Freq: Once | INTRAMUSCULAR | Status: AC
  Administered 2023-12-13: 10 mg via INTRAVENOUS
  Filled 2023-12-13: qty 1

## 2023-12-13 MED ORDER — PREDNISONE 10 MG PO TABS
ORAL_TABLET | ORAL | 0 refills | Status: AC
  Filled 2023-12-13: qty 15, 6d supply, fill #0

## 2023-12-13 NOTE — Discharge Instructions (Addendum)
 Your shortness of breath seems to be due to a COPD exacerbation today.  You are given medications here for treatment of this exacerbation.  You have been prescribed prednisone  which is an anti-inflammatory to help improve your symptoms. Please take this medication as prescribed for the next 6 days (40mg  on days 1, 30mg  on days 2 and 3, 20mg  on days 4 and 5, 10mg  on day 6). Take this medication in the morning with breakfast, as taking it at night may make it hard to sleep. If you are a diabetic, please monitor your blood sugars closely on this medication, as it can cause your blood sugar to rise.   You have been prescribed an albuterol  inhaler to use as needed for wheezing or shortness of breath.  You should not be using this frequently.  If you are needing to use this a couple times a day or a couple times a week, you need to talk to your primary doctor about starting on other medications for better control of symptoms.  It is very unlikely that your shortness of breath was due to an issue in your heart.Your cardiac enzyme (troponin) was normal today. Your EKG which is a measure of the heart's electrical activity and rhythm is normal today. These would both show abnormalities if you were having a heart attack.  Your chest x-ray is normal today.  You had a slightly low hemoglobin which is when your blood counts.  Please have this monitored by your PCP.  Your blood pressure was elevated here today at 150/81. Aim to get 30 minutes of exercise daily and limit intake of processed/fast foods. Please take and record your blood pressures at home. Take them at the same time every day. Follow up with your PCP within the next month to discuss if you may need treatment for your blood pressure.   Return to the ER if you have any shortness of breath, difficulty breathing, chest pain, dizziness, jaw pain, left arm or shoulder pain, abdominal pain, unexplained fever, any other new or concerning symptoms.

## 2023-12-22 ENCOUNTER — Other Ambulatory Visit: Payer: Self-pay

## 2023-12-22 ENCOUNTER — Emergency Department (HOSPITAL_COMMUNITY)
Admission: EM | Admit: 2023-12-22 | Discharge: 2023-12-22 | Disposition: A | Discharge status: Home / Self Care | Attending: Emergency Medicine | Admitting: Emergency Medicine

## 2023-12-22 ENCOUNTER — Emergency Department (HOSPITAL_COMMUNITY)

## 2023-12-22 DIAGNOSIS — J439 Emphysema, unspecified: Secondary | ICD-10-CM | POA: Diagnosis not present

## 2023-12-22 DIAGNOSIS — I10 Essential (primary) hypertension: Secondary | ICD-10-CM | POA: Insufficient documentation

## 2023-12-22 DIAGNOSIS — J432 Centrilobular emphysema: Secondary | ICD-10-CM | POA: Insufficient documentation

## 2023-12-22 DIAGNOSIS — J441 Chronic obstructive pulmonary disease with (acute) exacerbation: Secondary | ICD-10-CM | POA: Insufficient documentation

## 2023-12-22 DIAGNOSIS — F17210 Nicotine dependence, cigarettes, uncomplicated: Secondary | ICD-10-CM | POA: Insufficient documentation

## 2023-12-22 DIAGNOSIS — R0602 Shortness of breath: Secondary | ICD-10-CM | POA: Diagnosis not present

## 2023-12-22 DIAGNOSIS — Z9981 Dependence on supplemental oxygen: Secondary | ICD-10-CM | POA: Insufficient documentation

## 2023-12-22 DIAGNOSIS — I700 Atherosclerosis of aorta: Secondary | ICD-10-CM | POA: Diagnosis not present

## 2023-12-22 LAB — BASIC METABOLIC PANEL WITH GFR
Anion gap: 17 — ABNORMAL HIGH (ref 5–15)
BUN: 12 mg/dL (ref 8–23)
CO2: 21 mmol/L — ABNORMAL LOW (ref 22–32)
Calcium: 9.3 mg/dL (ref 8.9–10.3)
Chloride: 105 mmol/L (ref 98–111)
Creatinine, Ser: 1.05 mg/dL (ref 0.61–1.24)
GFR, Estimated: >60 mL/min (ref >60)
Glucose, Bld: 101 mg/dL — ABNORMAL HIGH (ref 70–99)
Potassium: 3.8 mmol/L (ref 3.5–5.1)
Sodium: 143 mmol/L (ref 135–145)

## 2023-12-22 LAB — CBC
HCT: 42.1 % (ref 39.0–52.0)
Hemoglobin: 13.3 g/dL (ref 13.0–17.0)
MCH: 34.1 pg — ABNORMAL HIGH (ref 26.0–34.0)
MCHC: 31.6 g/dL (ref 30.0–36.0)
MCV: 107.9 fL — ABNORMAL HIGH (ref 80.0–100.0)
Platelets: 248 K/uL (ref 150–400)
RBC: 3.9 MIL/uL — ABNORMAL LOW (ref 4.22–5.81)
RDW: 14.1 % (ref 11.5–15.5)
WBC: 7.2 K/uL (ref 4.0–10.5)
nRBC: 0 % (ref 0.0–0.2)

## 2023-12-22 LAB — TROPONIN I (HIGH SENSITIVITY)
Troponin I (High Sensitivity): 9 ng/L (ref <18)
Troponin I (High Sensitivity): 11 ng/L (ref <18)

## 2023-12-22 MED ORDER — IPRATROPIUM-ALBUTEROL 0.5-2.5 (3) MG/3ML IN SOLN
3.0000 mL | Freq: Once | RESPIRATORY_TRACT | Status: AC
  Administered 2023-12-22: 3 mL via RESPIRATORY_TRACT
  Filled 2023-12-22: qty 3

## 2023-12-22 MED ORDER — BREZTRI AEROSPHERE 160-9-4.8 MCG/ACT IN AERO
2.0000 | INHALATION_SPRAY | Freq: Two times a day (BID) | RESPIRATORY_TRACT | 2 refills | Status: DC
  Filled 2023-12-22 – 2024-01-05 (×3): qty 10.7, 30d supply, fill #0

## 2023-12-22 MED ORDER — ALBUTEROL SULFATE HFA 108 (90 BASE) MCG/ACT IN AERS
2.0000 | INHALATION_SPRAY | RESPIRATORY_TRACT | 1 refills | Status: DC | PRN
  Filled 2023-12-22 – 2023-12-29 (×2): qty 18, 17d supply, fill #0

## 2023-12-22 MED ORDER — METHYLPREDNISOLONE SODIUM SUCC 125 MG IJ SOLR
125.0000 mg | Freq: Once | INTRAMUSCULAR | Status: AC
  Administered 2023-12-22: 125 mg via INTRAVENOUS
  Filled 2023-12-22: qty 2

## 2023-12-22 MED ORDER — PREDNISONE 20 MG PO TABS
40.0000 mg | ORAL_TABLET | Freq: Every day | ORAL | 0 refills | Status: AC
  Filled 2023-12-22: qty 10, 5d supply, fill #0

## 2023-12-22 NOTE — ED Triage Notes (Signed)
 [facility] d/t SOB at rest and exertion. Hx of  COPD. Pt seen recently for same thing. Pt states his  symptoms started after smoking a cigarette tonight. Wheezing upon auscultation.

## 2023-12-22 NOTE — ED Provider Notes (Signed)
 [facility] Emergency Department Provider Note MRN:  [id number]  Arrival date & time: 12/22/23     Chief Complaint   Shortness of Breath   History of Present Illness   Jason Wong is a 68 y.o. year-old male with a history of hypertension, COPD presenting to the ED with chief complaint of shortness of breath.  Shortness of breath feels like his asthma.  Started up after smoking a cigarette this evening.  Denies chest pain, no recent fever.  Review of Systems  A thorough review of systems was obtained and all systems are negative except as noted in the HPI and PMH.   Patient's Health History    Past Medical History:  Diagnosis Date   Asthma    COPD (chronic obstructive pulmonary disease) (HCC)    Hepatitis C antibody positive in blood    HLD (hyperlipidemia)    HTN (hypertension)    On home oxygen  therapy    per pt 08-31-2021 uses 02 about every other day    Past Surgical History:  Procedure Laterality Date   INCISE AND DRAIN ABCESS     dog bite    Family History  Problem Relation Age of Onset   Hypertension Mother    Breast cancer Sister    Hypertension Sister    Colon cancer Brother    Colon polyps Neg Hx    Esophageal cancer Neg Hx    Rectal cancer Neg Hx    Stomach cancer Neg Hx     Social History   Socioeconomic History   Marital status: Single    Spouse name: Not on file   Number of children: Not on file   Years of education: Not on file   Highest education level: Not on file  Occupational History   Not on file  Tobacco Use   Smoking status: Every Day    Types: Cigarettes   Smokeless tobacco: Never  Vaping Use   Vaping status: Never Used  Substance and Sexual Activity   Alcohol use: Yes    Alcohol/week: 2.0 standard drinks of alcohol    Types: 2 Cans of beer per week   Drug use: Not Currently    Types: Crack cocaine   Sexual activity: Yes    Partners: Female  Other Topics Concern   Not on file  Social History  Narrative   ** Merged History Encounter **       Pt states he is from home with his nephew. He uses the bus to get to appointments and the grocery store and will be using the bus to get home. He has a nebulizer at home and two metal oxygen  tanks that he uses as needed. He states he doesn't know who fil   ls them, but says they do know who to call when they get empty.   Social Drivers of Corporate investment banker Strain: Low Risk  (12/01/2023)   Overall Financial Resource Strain (CARDIA)    Difficulty of Paying Living Expenses: Not very hard  Food Insecurity: No Food Insecurity (12/01/2023)   Hunger Vital Sign    Worried About Running Out of Food in the Last Year: Never true    Ran Out of Food in the Last Year: Never true  Transportation Needs: No Transportation Needs (12/01/2023)   PRAPARE - Administrator, Civil Service (Medical): No    Lack of Transportation (Non-Medical): No  Physical Activity: Inactive (12/01/2023)   Exercise Vital Sign  Days of Exercise per Week: 0 days    Minutes of Exercise per Session: 0 min  Stress: No Stress Concern Present (12/01/2023)   [employer] of Occupational Health - Occupational Stress Questionnaire    Feeling of Stress: Only a little  Social Connections: Socially Isolated (12/01/2023)   Social Connection and Isolation Panel    Frequency of Communication with Friends and Family: Three times a week    Frequency of Social Gatherings with Friends and Family: Three times a week    Attends Religious Services: Never    Active Member of Clubs or Organizations: No    Attends Banker Meetings: Never    Marital Status: Never married  Intimate Partner Violence: Not At Risk (12/01/2023)   Humiliation, Afraid, Rape, and Kick questionnaire    Fear of Current or Ex-Partner: No    Emotionally Abused: No    Physically Abused: No    Sexually Abused: No     Physical Exam   Vitals:   12/22/23 0152 12/22/23 0157  BP:  (!) 147/100   Pulse:  73  Resp:  19  Temp:  98.2 F (36.8 C)  SpO2: 100% 100%    CONSTITUTIONAL: Well-appearing, NAD NEURO/PSYCH:  Alert and oriented x 3, no focal deficits EYES:  eyes equal and reactive ENT/NECK:  no LAD, no JVD CARDIO: Regular rate, well-perfused, normal S1 and S2 PULM: Wheezing, mild tachypnea, mild accessory muscle use GI/GU:  non-distended, non-tender MSK/SPINE:  No gross deformities, no edema SKIN:  no rash, atraumatic   *Additional and/or pertinent findings included in MDM below  Diagnostic and Interventional Summary    EKG Interpretation Date/Time:  Friday December 22 2023 01:54:25 EDT Ventricular Rate:  74 PR Interval:  143 QRS Duration:  86 QT Interval:  394 QTC Calculation: 438 R Axis:   89  Text Interpretation: Sinus rhythm Consider right atrial enlargement Borderline right axis deviation Nonspecific T abnormalities, lateral leads Confirmed by Theadore Sharper [phone]) on 12/22/2023 3:32:56 AM       Labs Reviewed  CBC - Abnormal; Notable for the following components:      Result Value   RBC 3.90 (*)    MCV 107.9 (*)    MCH 34.1 (*)    All other components within normal limits  BASIC METABOLIC PANEL WITH GFR - Abnormal; Notable for the following components:   CO2 21 (*)    Glucose, Bld 101 (*)    Anion gap 17 (*)    All other components within normal limits  TROPONIN I (HIGH SENSITIVITY)  TROPONIN I (HIGH SENSITIVITY)    DG Chest Port 1 View  Final Result      Medications  ipratropium-albuterol  (DUONEB) 0.5-2.5 (3) MG/3ML nebulizer solution 3 mL (3 mLs Nebulization Given 12/22/23 0221)  ipratropium-albuterol  (DUONEB) 0.5-2.5 (3) MG/3ML nebulizer solution 3 mL (3 mLs Nebulization Given 12/22/23 0349)  methylPREDNISolone  sodium succinate (SOLU-MEDROL ) 125 mg/2 mL injection 125 mg (125 mg Intravenous Given 12/22/23 0349)     Procedures  /  Critical Care Procedures  ED Course and Medical Decision Making  Initial Impression and Ddx Suspect COPD  exacerbation, other considerations would include atypical presentation of ACS, pneumonia, pneumothorax.  Doubt PE.  Past medical/surgical history that increases complexity of ED encounter: COPD  Interpretation of Diagnostics I personally reviewed the EKG and my interpretation is as follows: Sinus rhythm, nonspecific findings  No significant blood count or electrolyte disturbance.  Troponin negative  Patient Reassessment and Ultimate Disposition/Management     Patient  doing much better on reassessment, on room air, work of breathing improved.  Appropriate for discharge.  Patient management required discussion with the following services or consulting groups:  None  Complexity of Problems Addressed Acute illness or injury that poses threat of life of bodily function  Additional Data Reviewed and Analyzed Further history obtained from: Prior labs/imaging results  Additional Factors Impacting ED Encounter Risk Consideration of hospitalization  Ozell HERO. Theadore, MD [facility] [email or web address]  Final Clinical Impressions(s) / ED Diagnoses     ICD-10-CM   1. COPD exacerbation (HCC)  J44.1     2. Centrilobular emphysema (HCC)  J43.2 budesonide -glycopyrrolate -formoterol  (BREZTRI  AEROSPHERE) 160-9-4.8 MCG/ACT AERO inhaler      ED Discharge Orders          Ordered    budesonide -glycopyrrolate -formoterol  (BREZTRI  AEROSPHERE) 160-9-4.8 MCG/ACT AERO inhaler  2 times daily        12/22/23 0517    predniSONE  (DELTASONE ) 20 MG tablet  Daily        12/22/23 0517    albuterol  (VENTOLIN  HFA) 108 (90 Base) MCG/ACT inhaler  Every 4 hours PRN        12/22/23 0517             Discharge Instructions Discussed with and Provided to Patient:     Discharge Instructions      You were evaluated in the Emergency Department and after careful evaluation, we did not find any emergent condition requiring admission or further testing in the  hospital.  Your exam/testing today is overall reassuring.  Symptoms likely due to a flare of your COPD.  Take your inhalers at home as directed, take the steroid prednisone  medication daily for the next 5 days.  Follow-up with your primary care doctor.  Please stop smoking.  Please return to the Emergency Department if you experience any worsening of your condition.   Thank you for allowing us  to be a part of your care.       Theadore Ozell HERO, MD 12/22/23 [id number]

## 2023-12-22 NOTE — Discharge Instructions (Signed)
 You were evaluated in the Emergency Department and after careful evaluation, we did not find any emergent condition requiring admission or further testing in the hospital.  Your exam/testing today is overall reassuring.  Symptoms likely due to a flare of your COPD.  Take your inhalers at home as directed, take the steroid prednisone  medication daily for the next 5 days.  Follow-up with your primary care doctor.  Please stop smoking.  Please return to the Emergency Department if you experience any worsening of your condition.   Thank you for allowing us  to be a part of your care.

## 2023-12-29 ENCOUNTER — Other Ambulatory Visit: Payer: Self-pay

## 2024-01-05 ENCOUNTER — Other Ambulatory Visit: Payer: Self-pay

## 2024-01-05 ENCOUNTER — Observation Stay (HOSPITAL_COMMUNITY)
Admission: EM | Admit: 2024-01-05 | Discharge: 2024-01-08 | DRG: 189 | Disposition: A | Attending: Internal Medicine | Admitting: Internal Medicine

## 2024-01-05 ENCOUNTER — Encounter (HOSPITAL_COMMUNITY): Payer: Self-pay | Admitting: Emergency Medicine

## 2024-01-05 ENCOUNTER — Emergency Department (HOSPITAL_COMMUNITY)

## 2024-01-05 DIAGNOSIS — Z7951 Long term (current) use of inhaled steroids: Secondary | ICD-10-CM | POA: Diagnosis not present

## 2024-01-05 DIAGNOSIS — F17210 Nicotine dependence, cigarettes, uncomplicated: Secondary | ICD-10-CM | POA: Diagnosis present

## 2024-01-05 DIAGNOSIS — I10 Essential (primary) hypertension: Secondary | ICD-10-CM | POA: Diagnosis present

## 2024-01-05 DIAGNOSIS — Z79899 Other long term (current) drug therapy: Secondary | ICD-10-CM | POA: Diagnosis not present

## 2024-01-05 DIAGNOSIS — J9601 Acute respiratory failure with hypoxia: Secondary | ICD-10-CM | POA: Diagnosis not present

## 2024-01-05 DIAGNOSIS — J439 Emphysema, unspecified: Secondary | ICD-10-CM | POA: Diagnosis not present

## 2024-01-05 DIAGNOSIS — E785 Hyperlipidemia, unspecified: Secondary | ICD-10-CM | POA: Diagnosis present

## 2024-01-05 DIAGNOSIS — R0902 Hypoxemia: Secondary | ICD-10-CM | POA: Diagnosis not present

## 2024-01-05 DIAGNOSIS — Z91148 Patient's other noncompliance with medication regimen for other reason: Secondary | ICD-10-CM

## 2024-01-05 DIAGNOSIS — Z8249 Family history of ischemic heart disease and other diseases of the circulatory system: Secondary | ICD-10-CM

## 2024-01-05 DIAGNOSIS — R6889 Other general symptoms and signs: Secondary | ICD-10-CM | POA: Diagnosis not present

## 2024-01-05 DIAGNOSIS — Z8619 Personal history of other infectious and parasitic diseases: Secondary | ICD-10-CM | POA: Diagnosis not present

## 2024-01-05 DIAGNOSIS — Z9109 Other allergy status, other than to drugs and biological substances: Secondary | ICD-10-CM

## 2024-01-05 DIAGNOSIS — E7800 Pure hypercholesterolemia, unspecified: Secondary | ICD-10-CM

## 2024-01-05 DIAGNOSIS — Z9981 Dependence on supplemental oxygen: Secondary | ICD-10-CM | POA: Diagnosis not present

## 2024-01-05 DIAGNOSIS — I499 Cardiac arrhythmia, unspecified: Secondary | ICD-10-CM | POA: Diagnosis not present

## 2024-01-05 DIAGNOSIS — J441 Chronic obstructive pulmonary disease with (acute) exacerbation: Secondary | ICD-10-CM | POA: Diagnosis not present

## 2024-01-05 DIAGNOSIS — F1410 Cocaine abuse, uncomplicated: Secondary | ICD-10-CM | POA: Diagnosis present

## 2024-01-05 DIAGNOSIS — F1910 Other psychoactive substance abuse, uncomplicated: Secondary | ICD-10-CM | POA: Diagnosis present

## 2024-01-05 DIAGNOSIS — R0602 Shortness of breath: Secondary | ICD-10-CM | POA: Diagnosis not present

## 2024-01-05 DIAGNOSIS — J432 Centrilobular emphysema: Secondary | ICD-10-CM

## 2024-01-05 DIAGNOSIS — I700 Atherosclerosis of aorta: Secondary | ICD-10-CM | POA: Diagnosis not present

## 2024-01-05 DIAGNOSIS — Z1152 Encounter for screening for COVID-19: Secondary | ICD-10-CM

## 2024-01-05 DIAGNOSIS — Z743 Need for continuous supervision: Secondary | ICD-10-CM | POA: Diagnosis not present

## 2024-01-05 DIAGNOSIS — F17200 Nicotine dependence, unspecified, uncomplicated: Secondary | ICD-10-CM

## 2024-01-05 LAB — I-STAT VENOUS BLOOD GAS, ED
Acid-base deficit: 1 mmol/L (ref 0.0–2.0)
Bicarbonate: 21.5 mmol/L (ref 20.0–28.0)
Calcium, Ion: 1.06 mmol/L — ABNORMAL LOW (ref 1.15–1.40)
HCT: 39 % (ref 39.0–52.0)
Hemoglobin: 13.3 g/dL (ref 13.0–17.0)
O2 Saturation: 93 %
Potassium: 3.8 mmol/L (ref 3.5–5.1)
Sodium: 141 mmol/L (ref 135–145)
TCO2: 22 mmol/L (ref 22–32)
pCO2, Ven: 29.4 mmHg — ABNORMAL LOW (ref 44–60)
pH, Ven: 7.472 — ABNORMAL HIGH (ref 7.25–7.43)
pO2, Ven: 61 mmHg — ABNORMAL HIGH (ref 32–45)

## 2024-01-05 LAB — CBC WITH DIFFERENTIAL/PLATELET
Abs Immature Granulocytes: 0.01 K/uL (ref 0.00–0.07)
Basophils Absolute: 0.1 K/uL (ref 0.0–0.1)
Basophils Relative: 1 %
Eosinophils Absolute: 0.4 K/uL (ref 0.0–0.5)
Eosinophils Relative: 6 %
HCT: 45.5 % (ref 39.0–52.0)
Hemoglobin: 14.7 g/dL (ref 13.0–17.0)
Immature Granulocytes: 0 %
Lymphocytes Relative: 48 %
Lymphs Abs: 2.8 K/uL (ref 0.7–4.0)
MCH: 33.5 pg (ref 26.0–34.0)
MCHC: 32.3 g/dL (ref 30.0–36.0)
MCV: 103.6 fL — ABNORMAL HIGH (ref 80.0–100.0)
Monocytes Absolute: 0.5 K/uL (ref 0.1–1.0)
Monocytes Relative: 8 %
Neutro Abs: 2.2 K/uL (ref 1.7–7.7)
Neutrophils Relative %: 37 %
Platelets: 255 K/uL (ref 150–400)
RBC: 4.39 MIL/uL (ref 4.22–5.81)
RDW: 12.9 % (ref 11.5–15.5)
WBC: 5.9 K/uL (ref 4.0–10.5)
nRBC: 0 % (ref 0.0–0.2)

## 2024-01-05 LAB — COMPREHENSIVE METABOLIC PANEL WITH GFR
ALT: 14 U/L (ref 0–44)
AST: 24 U/L (ref 15–41)
Albumin: 4.6 g/dL (ref 3.5–5.0)
Alkaline Phosphatase: 73 U/L (ref 38–126)
Anion gap: 14 (ref 5–15)
BUN: 11 mg/dL (ref 8–23)
CO2: 22 mmol/L (ref 22–32)
Calcium: 9.7 mg/dL (ref 8.9–10.3)
Chloride: 105 mmol/L (ref 98–111)
Creatinine, Ser: 1.02 mg/dL (ref 0.61–1.24)
GFR, Estimated: >60 mL/min (ref >60)
Glucose, Bld: 82 mg/dL (ref 70–99)
Potassium: 4 mmol/L (ref 3.5–5.1)
Sodium: 141 mmol/L (ref 135–145)
Total Bilirubin: 0.5 mg/dL (ref 0.0–1.2)
Total Protein: 8.1 g/dL (ref 6.5–8.1)

## 2024-01-05 LAB — TROPONIN I (HIGH SENSITIVITY)
Troponin I (High Sensitivity): 6 ng/L (ref <18)
Troponin I (High Sensitivity): 7 ng/L (ref <18)

## 2024-01-05 LAB — BRAIN NATRIURETIC PEPTIDE: B Natriuretic Peptide: 60.4 pg/mL (ref 0.0–100.0)

## 2024-01-05 MED ORDER — METHYLPREDNISOLONE SODIUM SUCC 40 MG IJ SOLR
40.0000 mg | Freq: Once | INTRAMUSCULAR | Status: AC
  Administered 2024-01-05: 40 mg via INTRAVENOUS
  Filled 2024-01-05: qty 1

## 2024-01-05 MED ORDER — ALBUTEROL SULFATE (2.5 MG/3ML) 0.083% IN NEBU
2.5000 mg | INHALATION_SOLUTION | RESPIRATORY_TRACT | Status: DC | PRN
  Administered 2024-01-07: 2.5 mg via RESPIRATORY_TRACT
  Filled 2024-01-05: qty 3

## 2024-01-05 MED ORDER — DOXYCYCLINE HYCLATE 100 MG PO TABS
100.0000 mg | ORAL_TABLET | Freq: Two times a day (BID) | ORAL | Status: DC
  Administered 2024-01-06 – 2024-01-08 (×5): 100 mg via ORAL
  Filled 2024-01-05 (×5): qty 1

## 2024-01-05 MED ORDER — SODIUM CHLORIDE 0.9 % IV SOLN
100.0000 mg | Freq: Once | INTRAVENOUS | Status: AC
  Administered 2024-01-05: 100 mg via INTRAVENOUS

## 2024-01-05 MED ORDER — METHYLPREDNISOLONE SODIUM SUCC 40 MG IJ SOLR
40.0000 mg | Freq: Two times a day (BID) | INTRAMUSCULAR | Status: DC
  Administered 2024-01-06 – 2024-01-08 (×5): 40 mg via INTRAVENOUS
  Filled 2024-01-05 (×5): qty 1

## 2024-01-05 MED ORDER — ACETAMINOPHEN 325 MG PO TABS: 650.0000 mg | ORAL_TABLET | Freq: Four times a day (QID) | ORAL | Status: DC | PRN

## 2024-01-05 MED ORDER — IPRATROPIUM-ALBUTEROL 0.5-2.5 (3) MG/3ML IN SOLN
3.0000 mL | Freq: Four times a day (QID) | RESPIRATORY_TRACT | Status: DC
Start: 2024-01-06 — End: 2024-01-08
  Administered 2024-01-06 – 2024-01-08 (×9): 3 mL via RESPIRATORY_TRACT
  Filled 2024-01-05 (×9): qty 3

## 2024-01-05 MED ORDER — SODIUM CHLORIDE 0.9 % IV SOLN
2.0000 g | Freq: Once | INTRAVENOUS | Status: AC
  Administered 2024-01-05: 2 g via INTRAVENOUS
  Filled 2024-01-05: qty 20

## 2024-01-05 MED ORDER — GUAIFENESIN ER 600 MG PO TB12
600.0000 mg | ORAL_TABLET | Freq: Two times a day (BID) | ORAL | Status: DC
  Administered 2024-01-06 – 2024-01-08 (×6): 600 mg via ORAL
  Filled 2024-01-05 (×6): qty 1

## 2024-01-05 MED ORDER — BUDESONIDE 0.25 MG/2ML IN SUSP
0.2500 mg | Freq: Two times a day (BID) | RESPIRATORY_TRACT | Status: DC
Start: 2024-01-05 — End: 2024-01-08
  Administered 2024-01-06 – 2024-01-08 (×5): 0.25 mg via RESPIRATORY_TRACT
  Filled 2024-01-05 (×5): qty 2

## 2024-01-05 MED ORDER — HYDRALAZINE HCL 20 MG/ML IJ SOLN: 5.0000 mg | Freq: Four times a day (QID) | INTRAMUSCULAR | Status: DC | PRN

## 2024-01-05 MED ORDER — ACETAMINOPHEN 650 MG RE SUPP: 650.0000 mg | Freq: Four times a day (QID) | RECTAL | Status: DC | PRN

## 2024-01-05 MED ORDER — ENOXAPARIN SODIUM 30 MG/0.3ML IJ SOSY
30.0000 mg | PREFILLED_SYRINGE | INTRAMUSCULAR | Status: DC
  Administered 2024-01-06: 30 mg via SUBCUTANEOUS
  Filled 2024-01-05: qty 0.3

## 2024-01-05 NOTE — ED Provider Notes (Signed)
  EMERGENCY DEPARTMENT AT [facility] Provider Note   CSN: [id number] Arrival date & time: 01/05/24  8165     Patient presents with: Shortness of Breath   Jason Wong is a 68 y.o. male.  Patient is a 68 year old male with PMH of HTN, HLD, asthma/COPD, chronic tobacco use, and hepatitis C presenting to the ED via EMS with chief complaint of new onset shortness of breath/dyspnea beginning earlier today in the setting of several days of nonproductive cough with change in sputum production earlier today.  Patient denies any recent fevers, chills, nausea, vomiting, chest pain, abdominal pain, changes in urination, or changes in bowel movements.  He denies any recent palpitations or syncope.  Of note, patient was reportedly found tripoding by EMS, breathing 44 times a minute, placed on NRB and given a single DuoNeb with symptomatic improvement.  Patient reports that he has never been intubated for aeCOPD in the past.    Prior to Admission medications   Medication Sig Start Date End Date Taking? Authorizing Provider  albuterol  (VENTOLIN  HFA) 108 (90 Base) MCG/ACT inhaler Inhale 2 puffs into the lungs every 4 (four) hours as needed for wheezing or shortness of breath. 12/22/23   Theadore Ozell HERO, MD  atorvastatin  (LIPITOR) 10 MG tablet Take 1 tablet (10 mg total) by mouth daily. Patient not taking: Reported on 12/01/2023 11/10/23 12/10/23  Christobal Guadalajara, MD  budesonide -glycopyrrolate -formoterol  (BREZTRI  AEROSPHERE) 160-9-4.8 MCG/ACT AERO inhaler Inhale 2 puffs into the lungs 2 (two) times daily. 12/22/23   Theadore Ozell HERO, MD  folic acid  (FOLVITE ) 1 MG tablet Take 1 tablet (1 mg total) by mouth daily. Patient not taking: Reported on 12/01/2023 08/26/23   Caleen Burgess BROCKS, MD  ipratropium-albuterol  (DUONEB) 0.5-2.5 (3) MG/3ML SOLN Take 3 mLs by nebulization every 6 (six) hours as needed (shortness of breath , wheezing). Patient not taking: Reported on 12/01/2023 11/10/23   Christobal Guadalajara, MD   nicotine  polacrilex (NICORETTE ) 2 MG gum Take 1 each (2 mg total) by mouth as needed for smoking cessation. 12/01/23   Vicci Barnie NOVAK, MD    Allergies: Tobacco    Review of Systems  Respiratory:  Positive for shortness of breath.     Updated Vital Signs BP (!) 140/97   Pulse 76   Temp 98.1 F (36.7 C) (Oral)   Resp 14   Ht 5' 7 (1.702 m)   Wt 53.5 kg   SpO2 94%   BMI 18.48 kg/m   Physical Exam  (all labs ordered are listed, but only abnormal results are displayed) Labs Reviewed  CBC WITH DIFFERENTIAL/PLATELET - Abnormal; Notable for the following components:      Result Value   MCV 103.6 (*)    All other components within normal limits  BRAIN NATRIURETIC PEPTIDE  COMPREHENSIVE METABOLIC PANEL WITH GFR  I-STAT VENOUS BLOOD GAS, ED  TROPONIN I (HIGH SENSITIVITY)  TROPONIN I (HIGH SENSITIVITY)    EKG: EKG Interpretation Date/Time:  Friday January 05 2024 18:38:54 EDT Ventricular Rate:  77 PR Interval:  202 QRS Duration:  109 QT Interval:  408 QTC Calculation: 456 R Axis:   84  Text Interpretation: Sinus rhythm Paired ventricular premature complexes Probable left atrial enlargement Borderline right axis deviation Low voltage, precordial leads Repol abnrm suggests ischemia, lateral leads Minimal ST elevation, lateral leads when compared to prior, more wandering baseline No sTEMI Confirmed by Ginger Barefoot ([id number]) on 01/05/2024 7:43:07 PM  Radiology: [facility] Chest Portable 1 View Result Date: 01/05/2024 CLINICAL DATA:  sob EXAM: PORTABLE CHEST - 1 VIEW COMPARISON:  12/22/2023, 09/11/2023 FINDINGS: Emphysema. No focal airspace consolidation, pleural effusion, or pneumothorax. No cardiomegaly.Aortic atherosclerosis.No acute fracture or destructive lesion. IMPRESSION: Emphysema. No pneumonia or pulmonary edema. Electronically Signed   By: Rogelia Myers M.D.   On: 01/05/2024 19:36    Procedures   Medications Ordered in the ED  doxycycline  (VIBRAMYCIN ) 100 mg in  sodium chloride  0.9 % 250 mL IVPB (100 mg Intravenous New Bag/Given 01/05/24 2044)  methylPREDNISolone  sodium succinate (SOLU-MEDROL ) 40 mg/mL injection 40 mg (40 mg Intravenous Given 01/05/24 1933)  cefTRIAXone  (ROCEPHIN ) 2 g in sodium chloride  0.9 % 100 mL IVPB (2 g Intravenous New Bag/Given 01/05/24 2030)    Clinical Course as of 01/05/24 2226  Fri Jan 05, 2024  2226 Admitted.  [WB]    Clinical Course User Index [WB] Chrles Selley, Elsie, MD                               Medical Decision Making Amount and/or Complexity of Data Reviewed Labs: ordered. Radiology: ordered. Decision-making details documented in ED Course.  Risk Prescription drug management.   68 year old male with PMH as above presenting to the ED with respiratory distress in the setting of nonproductive cough with new onset sputum production and dyspnea beginning earlier today without accompanying infectious symptoms.  Notably, patient given single DuoNeb in the prehospital setting with significant symptomatic improvement. Patient with reported history/charted history of COPD, however no documented PFTs on chart review.  Notably, patient with ED presentations for aeCOPD 08/22, 08/08, 07/28, as well as 10/05/2023.  Emphysematous changes noted on CT chest 09/11/2023.  No history of Pseudomonas colonization.  Upon arrival, patient  hypertensive 142/98.  Afebrile.  No tachycardia's.  Mild tachypnea 21 breaths/min, however saturating 95% on RA.  GCS 15.  Patient overall nontoxic-appearing in no acute distress.  Diffuse coarse lung sounds on auscultation without focal rhonchi or wheezing.  Patient given Solu-Medrol  40 mg IV.  Patient given 2 g IV ceftriaxone  and IV doxycycline  for atypical coverage.  Patient ambulatory challenge with intermittent desaturations to 86% on RA.  CMP without evidence of acute electrolytic derangement or renal impairment.  Cardiac biomarkers including BNP and troponin within normal limits.  CBC without  leukocytosis or anemia.  EKG reviewed without evidence of STEMI.  Doubt ACS.  Given patient recurrent hospital/ED presentations and desaturations with ambulatory challenge, favor admission at this time.  Patient agreeable with plan.  Hospitalist service engaged for admission.  Final diagnoses:  COPD exacerbation [facility])    ED Discharge Orders     None          Yuji Walth, Elsie, MD 01/05/24 2106    Tegeler, Lonni PARAS, MD 01/08/24 [phone]

## 2024-01-05 NOTE — ED Triage Notes (Addendum)
 Pt from home- frequent ED visits for COPD and CHF. EMS called for SOB. States it had been going on for 15 mins before ems arrived. Tried home neb treatment. PT endorsed smoking 3 cigs today. EMS arrived and 68% RA. Visible signs of respiratory distress- tripoding, RR 44, tachypnea. States not on meds for BP or CHF anymore. Wheezing and rhonchi and diminished. Duoneb started by EMS. Lungs improved.

## 2024-01-05 NOTE — ED Notes (Signed)
 Pt ambulated to restroom- sats maintained at 89%. Pt endorsed he has oxygen  at home but does not wear it

## 2024-01-05 NOTE — ED Notes (Addendum)
 Please draw a dark green tube with the trop. Istat VBG was not analyzed.

## 2024-01-05 NOTE — H&P (Signed)
 History and Physical    Jason Wong FMW:[id number] DOB: [birth year] DOA: 01/05/2024  PCP: Vicci Barnie NOVAK, MD  Patient coming from: Home  Chief Complaint: Shortness of breath  HPI: Jason Wong is a 68 y.o. male with medical history significant of COPD, hypertension, hyperlipidemia, hepatitis C, polysubstance abuse (crack cocaine, tobacco) presenting with a chief complaint of shortness of breath.  Patient is endorsing cough productive of white sputum for the past 3 to 4 days.  Since this afternoon he has been feeling short of breath.  He is not on home oxygen .  He was using albuterol  rescue inhaler and DuoNeb without much improvement.  He does not use his home maintenance inhaler Breztri  regularly, only uses it when he is feeling short of breath.  Reports ongoing crack cocaine abuse, last used today.  Denies fevers or chest pain.  Patient states he was previously smoking 1 pack of cigarettes per day and has now cut down to 1 cigarette a day.  No other complaints.  ED Course: Oxygen  saturation 68% on room air with EMS and was in respiratory distress, tripoding, respiratory rate in the 40s, wheezing and rhonchi on exam. Placed on nonrebreather and was given DuoNeb treatment by EMS with symptomatic improvement. Vital signs on arrival to the ED: Temperature 97.9 F, pulse 77, respiratory rate 30, blood pressure 163/101, and SpO2 100% on 2 L [place]. Patient was later saturating well on room air at rest and respiratory rate improved. However, oxygen  saturation did drop down to 86% on room air with ambulation. CBC without leukocytosis or anemia. CMP without acute electrolyte derangement or evidence of AKI. Cardiac biomarkers including BNP and troponin x 2 negative.  EKG without evidence of STEMI. VBG with pH 7.47 and pCO2 29.4.  Chest x-ray showing emphysema but no pneumonia or pulmonary edema.  EKG showing sinus rhythm and no STEMI.  Patient was given Solu-Medrol  40 mg, ceftriaxone , and doxycycline .   Review  of Systems:  Review of Systems  All other systems reviewed and are negative.   Past Medical History:  Diagnosis Date   Asthma    COPD (chronic obstructive pulmonary disease) (HCC)    Hepatitis C antibody positive in blood    HLD (hyperlipidemia)    HTN (hypertension)    On home oxygen  therapy    per pt 08-31-2021 uses 02 about every other day    Past Surgical History:  Procedure Laterality Date   INCISE AND DRAIN ABCESS     dog bite     reports that he has been smoking cigarettes. He has never used smokeless tobacco. He reports current alcohol use of about 2.0 standard drinks of alcohol per week. He reports that he does not currently use drugs after having used the following drugs: Crack cocaine.  Allergies  Allergen Reactions   Tobacco Shortness Of Breath and Other (See Comments)    CIGARETTE SMOKE TRIGGERS THE PATIENT'S RESPIRATORY ISSUES!!    Family History  Problem Relation Age of Onset   Hypertension Mother    Breast cancer Sister    Hypertension Sister    Colon cancer Brother    Colon polyps Neg Hx    Esophageal cancer Neg Hx    Rectal cancer Neg Hx    Stomach cancer Neg Hx     Prior to Admission medications   Medication Sig Start Date End Date Taking? Authorizing Provider  albuterol  (VENTOLIN  HFA) 108 (90 Base) MCG/ACT inhaler Inhale 2 puffs into the lungs every 4 (four) hours as needed  for wheezing or shortness of breath. 12/22/23   Theadore Ozell HERO, MD  atorvastatin  (LIPITOR) 10 MG tablet Take 1 tablet (10 mg total) by mouth daily. Patient not taking: Reported on 12/01/2023 11/10/23 12/10/23  Christobal Guadalajara, MD  budesonide -glycopyrrolate -formoterol  (BREZTRI  AEROSPHERE) 160-9-4.8 MCG/ACT AERO inhaler Inhale 2 puffs into the lungs 2 (two) times daily. 12/22/23   Theadore Ozell HERO, MD  folic acid  (FOLVITE ) 1 MG tablet Take 1 tablet (1 mg total) by mouth daily. Patient not taking: Reported on 12/01/2023 08/26/23   Caleen Burgess BROCKS, MD  ipratropium-albuterol  (DUONEB) 0.5-2.5 (3)  MG/3ML SOLN Take 3 mLs by nebulization every 6 (six) hours as needed (shortness of breath , wheezing). Patient not taking: Reported on 12/01/2023 11/10/23   Christobal Guadalajara, MD  nicotine  polacrilex (NICORETTE ) 2 MG gum Take 1 each (2 mg total) by mouth as needed for smoking cessation. 12/01/23   Vicci Barnie NOVAK, MD    Physical Exam: Vitals:   01/05/24 1851 01/05/24 1935 01/05/24 1937 01/05/24 1938  BP: (!) 142/98 (!) 140/97    Pulse: 76 76    Resp: (!) 21 14    Temp:    98.1 F (36.7 C)  TempSrc:    Oral  SpO2: 100% 94%    Weight:   53.5 kg   Height:   5' 7 (1.702 m)     Physical Exam Vitals reviewed.  Constitutional:      General: He is not in acute distress. HENT:     Head: Normocephalic and atraumatic.  Eyes:     Extraocular Movements: Extraocular movements intact.  Cardiovascular:     Rate and Rhythm: Normal rate and regular rhythm.  Pulmonary:     Effort: Pulmonary effort is normal. No respiratory distress.     Comments: Diminished breath sounds bilaterally Abdominal:     General: Bowel sounds are normal.     Palpations: Abdomen is soft.     Tenderness: There is no abdominal tenderness. There is no guarding.  Musculoskeletal:     Cervical back: Normal range of motion.     Right lower leg: No edema.     Left lower leg: No edema.  Skin:    General: Skin is warm and dry.  Neurological:     General: No focal deficit present.     Mental Status: He is alert and oriented to person, place, and time.     Labs on Admission: I have personally reviewed following labs and imaging studies  CBC: Recent Labs  Lab 01/05/24 1841 01/05/24 2249  WBC 5.9  --   NEUTROABS 2.2  --   HGB 14.7 13.3  HCT 45.5 39.0  MCV 103.6*  --   PLT 255  --    Basic Metabolic Panel: Recent Labs  Lab 01/05/24 1841 01/05/24 2249  NA 141 141  K 4.0 3.8  CL 105  --   CO2 22  --   GLUCOSE 82  --   BUN 11  --   CREATININE 1.02  --   CALCIUM  9.7  --    GFR: Estimated Creatinine  Clearance: 52.5 mL/min (by C-G formula based on SCr of 1.02 mg/dL). Liver Function Tests: Recent Labs  Lab 01/05/24 1841  AST 24  ALT 14  ALKPHOS 73  BILITOT 0.5  PROT 8.1  ALBUMIN 4.6   No results for input(s): LIPASE, AMYLASE in the last 168 hours. No results for input(s): AMMONIA in the last 168 hours. Coagulation Profile: No results for input(s): INR, PROTIME  in the last 168 hours. Cardiac Enzymes: No results for input(s): CKTOTAL, CKMB, CKMBINDEX, TROPONINI in the last 168 hours. BNP (last 3 results) No results for input(s): PROBNP in the last 8760 hours. HbA1C: No results for input(s): HGBA1C in the last 72 hours. CBG: No results for input(s): GLUCAP in the last 168 hours. Lipid Profile: No results for input(s): CHOL, HDL, LDLCALC, TRIG, CHOLHDL, LDLDIRECT in the last 72 hours. Thyroid  Function Tests: No results for input(s): TSH, T4TOTAL, FREET4, T3FREE, THYROIDAB in the last 72 hours. Anemia Panel: No results for input(s): VITAMINB12, FOLATE, FERRITIN, TIBC, IRON, RETICCTPCT in the last 72 hours. Urine analysis:    Component Value Date/Time   COLORURINE YELLOW 11/08/2023 1337   APPEARANCEUR CLEAR 11/08/2023 1337   LABSPEC 1.012 11/08/2023 1337   PHURINE 6.0 11/08/2023 1337   GLUCOSEU NEGATIVE 11/08/2023 1337   HGBUR NEGATIVE 11/08/2023 1337   BILIRUBINUR NEGATIVE 11/08/2023 1337   KETONESUR NEGATIVE 11/08/2023 1337   PROTEINUR NEGATIVE 11/08/2023 1337   NITRITE NEGATIVE 11/08/2023 1337   LEUKOCYTESUR NEGATIVE 11/08/2023 1337    Radiological Exams on Admission: DG Chest Portable 1 View Result Date: 01/05/2024 CLINICAL DATA:  sob EXAM: PORTABLE CHEST - 1 VIEW COMPARISON:  12/22/2023, 09/11/2023 FINDINGS: Emphysema. No focal airspace consolidation, pleural effusion, or pneumothorax. No cardiomegaly.Aortic atherosclerosis.No acute fracture or destructive lesion. IMPRESSION: Emphysema. No pneumonia or  pulmonary edema. Electronically Signed   By: Rogelia Myers M.D.   On: 01/05/2024 19:36    Assessment and Plan  Acute hypoxemic respiratory failure secondary to acute COPD exacerbation Due to noncompliance.  Does not use his maintenance inhaler regularly and also endorsing ongoing crack cocaine abuse.  Oxygen  saturation 68% on room air with EMS and was initially in respiratory distress.  Work of breathing has now significantly improved.  Currently satting well on 2 L [place].  Blood gas without evidence of hypercarbia.  Chest x-ray showing emphysema and no acute findings.  PE less likely given no tachycardia, chest pain, or clinical signs of DVT on exam.  Continue treatment with Solu-Medrol  40 mg every 12 hours, DuoNeb every 6 hours, Pulmicort  neb twice daily, albuterol  neb every 2 hours PRN, doxycycline , and Mucinex .  Incentive spirometry, flutter valve.  Continue supplemental oxygen , wean as tolerated.  Test for COVID/flu/RSV.  Hypertension Not on home meds.  Most recent SBP in the 140s.  Cocaine abuse likely contributing.  IV hydralazine  PRN SBP >160.  Hyperlipidemia Patient is not taking statin at home.  Check lipid panel.  Polysubstance abuse Reports ongoing crack cocaine abuse and smokes 1 cigarette a day.  Patient has been advised to stop using drugs and completely stop smoking cigarettes.  DVT prophylaxis: Lovenox  Code Status: Full Code (discussed with the patient) Level of care: Progressive Care Unit Admission status: It is my clinical opinion that referral for OBSERVATION is reasonable and necessary in this patient based on the above information provided. The aforementioned taken together are felt to place the patient at high risk for further clinical deterioration. However, it is anticipated that the patient may be medically stable for discharge from the hospital within 24 to 48 hours.  Editha Ram MD [facility]  If 7PM-7AM, please contact  night-coverage [email or web address]  01/05/2024, 10:59 PM

## 2024-01-05 NOTE — ED Notes (Signed)
[place] sandwich given

## 2024-01-06 ENCOUNTER — Other Ambulatory Visit: Payer: Self-pay

## 2024-01-06 DIAGNOSIS — Z91148 Patient's other noncompliance with medication regimen for other reason: Secondary | ICD-10-CM | POA: Diagnosis not present

## 2024-01-06 DIAGNOSIS — Z9981 Dependence on supplemental oxygen: Secondary | ICD-10-CM | POA: Diagnosis not present

## 2024-01-06 DIAGNOSIS — Z9109 Other allergy status, other than to drugs and biological substances: Secondary | ICD-10-CM | POA: Diagnosis not present

## 2024-01-06 DIAGNOSIS — Z8249 Family history of ischemic heart disease and other diseases of the circulatory system: Secondary | ICD-10-CM | POA: Diagnosis not present

## 2024-01-06 DIAGNOSIS — J439 Emphysema, unspecified: Secondary | ICD-10-CM | POA: Diagnosis present

## 2024-01-06 DIAGNOSIS — Z7951 Long term (current) use of inhaled steroids: Secondary | ICD-10-CM | POA: Diagnosis not present

## 2024-01-06 DIAGNOSIS — F17210 Nicotine dependence, cigarettes, uncomplicated: Secondary | ICD-10-CM | POA: Diagnosis present

## 2024-01-06 DIAGNOSIS — Z1152 Encounter for screening for COVID-19: Secondary | ICD-10-CM | POA: Diagnosis not present

## 2024-01-06 DIAGNOSIS — Z8619 Personal history of other infectious and parasitic diseases: Secondary | ICD-10-CM | POA: Diagnosis not present

## 2024-01-06 DIAGNOSIS — I10 Essential (primary) hypertension: Secondary | ICD-10-CM | POA: Diagnosis present

## 2024-01-06 DIAGNOSIS — Z79899 Other long term (current) drug therapy: Secondary | ICD-10-CM | POA: Diagnosis not present

## 2024-01-06 DIAGNOSIS — J9601 Acute respiratory failure with hypoxia: Secondary | ICD-10-CM | POA: Diagnosis present

## 2024-01-06 DIAGNOSIS — E785 Hyperlipidemia, unspecified: Secondary | ICD-10-CM | POA: Diagnosis present

## 2024-01-06 DIAGNOSIS — J441 Chronic obstructive pulmonary disease with (acute) exacerbation: Secondary | ICD-10-CM | POA: Diagnosis not present

## 2024-01-06 DIAGNOSIS — F1410 Cocaine abuse, uncomplicated: Secondary | ICD-10-CM | POA: Diagnosis present

## 2024-01-06 DIAGNOSIS — R0602 Shortness of breath: Secondary | ICD-10-CM | POA: Diagnosis present

## 2024-01-06 LAB — LIPID PANEL
Cholesterol: 205 mg/dL — ABNORMAL HIGH (ref 0–200)
HDL: 81 mg/dL (ref >40)
LDL Cholesterol: 120 mg/dL — ABNORMAL HIGH (ref 0–99)
Total CHOL/HDL Ratio: 2.5 ratio
Triglycerides: 22 mg/dL (ref <150)
VLDL: 4 mg/dL (ref 0–40)

## 2024-01-06 LAB — RESP PANEL BY RT-PCR (RSV, FLU A&B, COVID)  RVPGX2
Influenza A by PCR: NEGATIVE
Influenza B by PCR: NEGATIVE
Resp Syncytial Virus by PCR: NEGATIVE
SARS Coronavirus 2 by RT PCR: NEGATIVE

## 2024-01-06 LAB — D-DIMER, QUANTITATIVE: D-Dimer, Quant: <0.27 ug{FEU}/mL (ref 0.00–0.50)

## 2024-01-06 MED ORDER — ENOXAPARIN SODIUM 40 MG/0.4ML IJ SOSY
40.0000 mg | PREFILLED_SYRINGE | INTRAMUSCULAR | Status: DC
  Administered 2024-01-07 – 2024-01-08 (×2): 40 mg via SUBCUTANEOUS
  Filled 2024-01-06 (×2): qty 0.4

## 2024-01-06 MED ORDER — AMLODIPINE BESYLATE 5 MG PO TABS
5.0000 mg | ORAL_TABLET | Freq: Every day | ORAL | Status: DC
  Administered 2024-01-06 – 2024-01-08 (×3): 5 mg via ORAL
  Filled 2024-01-06 (×3): qty 1

## 2024-01-06 NOTE — Progress Notes (Addendum)
 Patient received from ED, he is alert and oriented, V/S obtained, CCMD notified, CHG bath given, denies and pain/ discomfort, all needs met, call bell at bedside.     01/06/24 1143  Vitals  Temp 97.6 F (36.4 C)  Temp Source Oral  BP (!) 146/96  MAP (mmHg) 109  BP Location Left Arm  BP Method Automatic  Patient Position (if appropriate) Sitting  Pulse Rate 65  Pulse Rate Source Monitor  ECG Heart Rate 65  Resp (!) 21  Level of Consciousness  Level of Consciousness Alert  Oxygen  Therapy  SpO2 97 %  O2 Device Nasal Cannula  O2 Flow Rate (L/min) 2 L/min  Pain Assessment  Pain Scale 0-10  Pain Score 0

## 2024-01-06 NOTE — ED Notes (Signed)
RN provided pt beverage

## 2024-01-06 NOTE — Progress Notes (Signed)
 PROGRESS NOTE    Jason Wong  FMW:[id number] DOB: 01/17/1956 DOA: 01/05/2024 PCP: Vicci Barnie NOVAK, MD  Outpatient Specialists:     Brief Narrative:  As per H&P done on admission: Jason Wong is a 68 y.o. male with medical history significant of COPD, hypertension, hyperlipidemia, hepatitis C, polysubstance abuse (crack cocaine, tobacco) presenting with a chief complaint of shortness of breath.  Patient is endorsing cough productive of white sputum for the past 3 to 4 days.  Since this afternoon he has been feeling short of breath.  He is not on home oxygen .  He was using albuterol  rescue inhaler and DuoNeb without much improvement.  He does not use his home maintenance inhaler Breztri  regularly, only uses it when he is feeling short of breath.  Reports ongoing crack cocaine abuse, last used today.  Denies fevers or chest pain.  Patient states he was previously smoking 1 pack of cigarettes per day and has now cut down to 1 cigarette a day.  No other complaints.   ED Course: Oxygen  saturation 68% on room air with EMS and was in respiratory distress, tripoding, respiratory rate in the 40s, wheezing and rhonchi on exam. Placed on nonrebreather and was given DuoNeb treatment by EMS with symptomatic improvement. Vital signs on arrival to the ED: Temperature 97.9 F, pulse 77, respiratory rate 30, blood pressure 163/101, and SpO2 100% on 2 L [place]. Patient was later saturating well on room air at rest and respiratory rate improved. However, oxygen  saturation did drop down to 86% on room air with ambulation. CBC without leukocytosis or anemia. CMP without acute electrolyte derangement or evidence of AKI. Cardiac biomarkers including BNP and troponin x 2 negative.  EKG without evidence of STEMI. VBG with pH 7.47 and pCO2 29.4.  Chest x-ray showing emphysema but no pneumonia or pulmonary edema.  EKG showing sinus rhythm and no STEMI.  Patient was given Solu-Medrol  40 mg, ceftriaxone , and  doxycycline .  01/06/2024: Patient continues to improve, but now back to baseline.  Patient is not keen on being discharged back home today.  Continue IV Solu-Medrol  and nebs Pulmicort  and DuoNeb.  Counseled to quit cigarette smoking.  Assess need for home oxygen  prior to discharge     Assessment & Plan:   Principal Problem:   COPD exacerbation (HCC) Active Problems:   Acute hypoxemic respiratory failure (HCC)   Polysubstance abuse (HCC)   HTN (hypertension)   HLD (hyperlipidemia)   Acute hypoxemic respiratory failure secondary to acute COPD exacerbation -Continue IV Solu-Medrol , nebs Pulmicort  and DuoNeb. - Assess need for home oxygen  prior to discharge. - Continue doxycycline . - Patient is improving.   Hypertension -Continue to monitor closely.   - Start amlodipine  5 mg p.o. once daily.   Hyperlipidemia LDL of 120. Diet and exercise. Follow LDL and manage accordingly.   Polysubstance abuse Reports ongoing crack cocaine abuse and smokes 1 cigarette a day.   Counseled to quit cigarette smoking and drugs.     DVT prophylaxis: Subcu Lovenox  Code Status: Full code Family Communication:  Disposition Plan: Inpatient.  Likely discharge back home in next 1 to 2 days.   Consultants:  None.  Procedures:  None  Antimicrobials:  Doxycycline    Subjective: -Continues to improve, but not back to baseline. - No acute on discharge back home today.  Objective: Vitals:   01/06/24 1100 01/06/24 1109 01/06/24 1143 01/06/24 1452  BP: 138/83  (!) 146/96   Pulse: 64  65   Resp: 12  (!) 21  Temp:  98.4 F (36.9 C) 97.6 F (36.4 C)   TempSrc:  Oral Oral   SpO2: 100%  97% 99%  Weight:      Height:        Intake/Output Summary (Last 24 hours) at 01/06/2024 1510 Last data filed at 01/06/2024 0420 Gross per 24 hour  Intake 353.34 ml  Output 300 ml  Net 53.34 ml   Filed Weights   01/05/24 1937  Weight: 53.5 kg    Examination:  General exam: Appears calm and  comfortable  Respiratory system: Decreased air entry globally.. Cardiovascular system: S1 & S2 heard Gastrointestinal system: Abdomen is soft and nontender.   Central nervous system: Awake and alert.   Extremities: No leg edema  Data Reviewed: I have personally reviewed following labs and imaging studies  CBC: Recent Labs  Lab 01/05/24 1841 01/05/24 2249  WBC 5.9  --   NEUTROABS 2.2  --   HGB 14.7 13.3  HCT 45.5 39.0  MCV 103.6*  --   PLT 255  --    Basic Metabolic Panel: Recent Labs  Lab 01/05/24 1841 01/05/24 2249  NA 141 141  K 4.0 3.8  CL 105  --   CO2 22  --   GLUCOSE 82  --   BUN 11  --   CREATININE 1.02  --   CALCIUM  9.7  --    GFR: Estimated Creatinine Clearance: 52.5 mL/min (by C-G formula based on SCr of 1.02 mg/dL). Liver Function Tests: Recent Labs  Lab 01/05/24 1841  AST 24  ALT 14  ALKPHOS 73  BILITOT 0.5  PROT 8.1  ALBUMIN 4.6   No results for input(s): LIPASE, AMYLASE in the last 168 hours. No results for input(s): AMMONIA in the last 168 hours. Coagulation Profile: No results for input(s): INR, PROTIME in the last 168 hours. Cardiac Enzymes: No results for input(s): CKTOTAL, CKMB, CKMBINDEX, TROPONINI in the last 168 hours. BNP (last 3 results) No results for input(s): PROBNP in the last 8760 hours. HbA1C: No results for input(s): HGBA1C in the last 72 hours. CBG: No results for input(s): GLUCAP in the last 168 hours. Lipid Profile: Recent Labs    01/06/24 0341  CHOL 205*  HDL 81  LDLCALC 120*  TRIG 22  CHOLHDL 2.5   Thyroid  Function Tests: No results for input(s): TSH, T4TOTAL, FREET4, T3FREE, THYROIDAB in the last 72 hours. Anemia Panel: No results for input(s): VITAMINB12, FOLATE, FERRITIN, TIBC, IRON, RETICCTPCT in the last 72 hours. Urine analysis:    Component Value Date/Time   COLORURINE YELLOW 11/08/2023 1337   APPEARANCEUR CLEAR 11/08/2023 1337   LABSPEC 1.012  11/08/2023 1337   PHURINE 6.0 11/08/2023 1337   GLUCOSEU NEGATIVE 11/08/2023 1337   HGBUR NEGATIVE 11/08/2023 1337   BILIRUBINUR NEGATIVE 11/08/2023 1337   KETONESUR NEGATIVE 11/08/2023 1337   PROTEINUR NEGATIVE 11/08/2023 1337   NITRITE NEGATIVE 11/08/2023 1337   LEUKOCYTESUR NEGATIVE 11/08/2023 1337   Sepsis Labs: @LABRCNTIP (procalcitonin:4,lacticidven:4)  ) Recent Results (from the past 240 hours)  Resp panel by RT-PCR (RSV, Flu A&B, Covid) Anterior Nasal Swab     Status: None   Collection Time: 01/06/24 12:36 AM   Specimen: Anterior Nasal Swab  Result Value Ref Range Status   SARS Coronavirus 2 by RT PCR NEGATIVE NEGATIVE Final   Influenza A by PCR NEGATIVE NEGATIVE Final   Influenza B by PCR NEGATIVE NEGATIVE Final    Comment: (NOTE) The Xpert Xpress SARS-CoV-2/FLU/RSV plus assay is intended as an aid in  the diagnosis of influenza from Nasopharyngeal swab specimens and should not be used as a sole basis for treatment. Nasal washings and aspirates are unacceptable for Xpert Xpress SARS-CoV-2/FLU/RSV testing.  Fact Sheet for Patients: BloggerCourse.com  Fact Sheet for Healthcare Providers: SeriousBroker.it  This test is not yet approved or cleared by the [place]  FDA and has been authorized for detection and/or diagnosis of SARS-CoV-2 by FDA under an Emergency Use Authorization (EUA). This EUA will remain in effect (meaning this test can be used) for the duration of the COVID-19 declaration under Section 564(b)(1) of the Act, 21 U.S.C. section 360bbb-3(b)(1), unless the authorization is terminated or revoked.     Resp Syncytial Virus by PCR NEGATIVE NEGATIVE Final    Comment: (NOTE) Fact Sheet for Patients: BloggerCourse.com  Fact Sheet for Healthcare Providers: SeriousBroker.it  This test is not yet approved or cleared by the [place]  FDA and has been  authorized for detection and/or diagnosis of SARS-CoV-2 by FDA under an Emergency Use Authorization (EUA). This EUA will remain in effect (meaning this test can be used) for the duration of the COVID-19 declaration under Section 564(b)(1) of the Act, 21 U.S.C. section 360bbb-3(b)(1), unless the authorization is terminated or revoked.  Performed at [facility], [address], [place], [place] [address]          Radiology Studies: DG Chest Portable 1 View Result Date: 01/05/2024 CLINICAL DATA:  sob EXAM: PORTABLE CHEST - 1 VIEW COMPARISON:  12/22/2023, 09/11/2023 FINDINGS: Emphysema. No focal airspace consolidation, pleural effusion, or pneumothorax. No cardiomegaly.Aortic atherosclerosis.No acute fracture or destructive lesion. IMPRESSION: Emphysema. No pneumonia or pulmonary edema. Electronically Signed   By: Rogelia Myers M.D.   On: 01/05/2024 19:36        Scheduled Meds:  budesonide  (PULMICORT ) nebulizer solution  0.25 mg Nebulization BID   doxycycline   100 mg Oral Q12H   enoxaparin  (LOVENOX ) injection  30 mg Subcutaneous Q24H   guaiFENesin   600 mg Oral BID   ipratropium-albuterol   3 mL Nebulization Q6H   methylPREDNISolone  (SOLU-MEDROL ) injection  40 mg Intravenous Q12H   Continuous Infusions:   LOS: 0 days    Time spent: 35 minutes.    Leatrice Chapel, MD  [facility] Pager #: [phone] 7PM-7AM contact night coverage as above

## 2024-01-07 DIAGNOSIS — J441 Chronic obstructive pulmonary disease with (acute) exacerbation: Secondary | ICD-10-CM | POA: Diagnosis not present

## 2024-01-07 MED ORDER — K PHOS MONO-SOD PHOS DI & MONO 155-852-130 MG PO TABS: 250.0000 mg | ORAL_TABLET | Freq: Three times a day (TID) | ORAL | Status: DC

## 2024-01-07 NOTE — Plan of Care (Signed)

## 2024-01-07 NOTE — Progress Notes (Signed)
 PROGRESS NOTE    Jason Wong  FMW:[id number] DOB: 06-16-1955 DOA: 01/05/2024 PCP: Vicci Barnie NOVAK, MD  Outpatient Specialists:     Brief Narrative:  Patient is a 68 year old male with past medical COPD, hypertension, hyperlipidemia, hepatitis C and polysubstance abuse (crack cocaine, tobacco).  Patient presented with shortness of breath.  Patient is currently being managed for COPD with exacerbation.    01/06/2024: Patient continues to improve, but now back to baseline.  Patient is not keen on being discharged back home today.  Continue IV Solu-Medrol  and nebs Pulmicort  and DuoNeb.  Counseled to quit cigarette smoking.  Assess need for home oxygen  prior to discharge  01/07/2024: Patient seen alongside patient's nurse.  Patient continues to report dyspnea on minimal exertion.  Last cardiac BNP was normal.  Patient is not keen on being discharged back home.  Will consult PT OT.   Assessment & Plan:   Principal Problem:   COPD exacerbation (HCC) Active Problems:   Acute hypoxemic respiratory failure (HCC)   Polysubstance abuse (HCC)   HTN (hypertension)   HLD (hyperlipidemia)   Acute hypoxemic respiratory failure secondary to acute COPD exacerbation -Continue IV Solu-Medrol , nebs Pulmicort  and DuoNeb. - Assess need for home oxygen  prior to discharge. - Continue doxycycline . - Patient is not keen on being discharged back home today.  - PT OT consult.   Hypertension -Continue to monitor closely.   - Continue amlodipine  5 mg p.o. once daily.   Hyperlipidemia LDL of 120. Diet and exercise.   Polysubstance abuse Reports ongoing crack cocaine abuse and smokes 1 cigarette a day.   Counseled to quit cigarette smoking and drugs.    Hypophosphatemia: - Neutra-Phos. - Repeat renal panel in the morning. - Phosphorus of 2.2 admitted on 01/06/2024.   DVT prophylaxis: Subcu Lovenox  Code Status: Full code Family Communication:  Disposition Plan: Inpatient.  Likely discharge  back home in next 1 to 2 days.   Consultants:  None.  Procedures:  None  Antimicrobials:  Doxycycline    Subjective: -Continues to improve, but not back to baseline. - No acute on discharge back home today.  Objective: Vitals:   01/07/24 1241 01/07/24 1512 01/07/24 1711 01/07/24 1938  BP: 127/80  131/88 (!) 153/96  Pulse: 71  68   Resp: 19  19   Temp: 98.1 F (36.7 C)  98.6 F (37 C)   TempSrc: Oral  Oral   SpO2:  97% 96%   Weight:      Height:        Intake/Output Summary (Last 24 hours) at 01/07/2024 1946 Last data filed at 01/07/2024 1500 Gross per 24 hour  Intake 480 ml  Output --  Net 480 ml   Filed Weights   01/05/24 1937  Weight: 53.5 kg    Examination:  General exam: Appears calm and comfortable  Respiratory system: Decreased air entry globally.. Cardiovascular system: S1 & S2 heard Gastrointestinal system: Abdomen is soft and nontender.   Central nervous system: Awake and alert.   Extremities: No leg edema  Data Reviewed: I have personally reviewed following labs and imaging studies  CBC: Recent Labs  Lab 01/05/24 1841 01/05/24 2249  WBC 5.9  --   NEUTROABS 2.2  --   HGB 14.7 13.3  HCT 45.5 39.0  MCV 103.6*  --   PLT 255  --    Basic Metabolic Panel: Recent Labs  Lab 01/05/24 1841 01/05/24 2249  NA 141 141  K 4.0 3.8  CL 105  --  CO2 22  --   GLUCOSE 82  --   BUN 11  --   CREATININE 1.02  --   CALCIUM  9.7  --    GFR: Estimated Creatinine Clearance: 52.5 mL/min (by C-G formula based on SCr of 1.02 mg/dL). Liver Function Tests: Recent Labs  Lab 01/05/24 1841  AST 24  ALT 14  ALKPHOS 73  BILITOT 0.5  PROT 8.1  ALBUMIN 4.6   No results for input(s): LIPASE, AMYLASE in the last 168 hours. No results for input(s): AMMONIA in the last 168 hours. Coagulation Profile: No results for input(s): INR, PROTIME in the last 168 hours. Cardiac Enzymes: No results for input(s): CKTOTAL, CKMB, CKMBINDEX,  TROPONINI in the last 168 hours. BNP (last 3 results) No results for input(s): PROBNP in the last 8760 hours. HbA1C: No results for input(s): HGBA1C in the last 72 hours. CBG: No results for input(s): GLUCAP in the last 168 hours. Lipid Profile: Recent Labs    01/06/24 0341  CHOL 205*  HDL 81  LDLCALC 120*  TRIG 22  CHOLHDL 2.5   Thyroid  Function Tests: No results for input(s): TSH, T4TOTAL, FREET4, T3FREE, THYROIDAB in the last 72 hours. Anemia Panel: No results for input(s): VITAMINB12, FOLATE, FERRITIN, TIBC, IRON, RETICCTPCT in the last 72 hours. Urine analysis:    Component Value Date/Time   COLORURINE YELLOW 11/08/2023 1337   APPEARANCEUR CLEAR 11/08/2023 1337   LABSPEC 1.012 11/08/2023 1337   PHURINE 6.0 11/08/2023 1337   GLUCOSEU NEGATIVE 11/08/2023 1337   HGBUR NEGATIVE 11/08/2023 1337   BILIRUBINUR NEGATIVE 11/08/2023 1337   KETONESUR NEGATIVE 11/08/2023 1337   PROTEINUR NEGATIVE 11/08/2023 1337   NITRITE NEGATIVE 11/08/2023 1337   LEUKOCYTESUR NEGATIVE 11/08/2023 1337   Sepsis Labs: @LABRCNTIP (procalcitonin:4,lacticidven:4)  ) Recent Results (from the past 240 hours)  Resp panel by RT-PCR (RSV, Flu A&B, Covid) Anterior Nasal Swab     Status: None   Collection Time: 01/06/24 12:36 AM   Specimen: Anterior Nasal Swab  Result Value Ref Range Status   SARS Coronavirus 2 by RT PCR NEGATIVE NEGATIVE Final   Influenza A by PCR NEGATIVE NEGATIVE Final   Influenza B by PCR NEGATIVE NEGATIVE Final    Comment: (NOTE) The Xpert Xpress SARS-CoV-2/FLU/RSV plus assay is intended as an aid in the diagnosis of influenza from Nasopharyngeal swab specimens and should not be used as a sole basis for treatment. Nasal washings and aspirates are unacceptable for Xpert Xpress SARS-CoV-2/FLU/RSV testing.  Fact Sheet for Patients: BloggerCourse.com  Fact Sheet for Healthcare  Providers: SeriousBroker.it  This test is not yet approved or cleared by the [place]  FDA and has been authorized for detection and/or diagnosis of SARS-CoV-2 by FDA under an Emergency Use Authorization (EUA). This EUA will remain in effect (meaning this test can be used) for the duration of the COVID-19 declaration under Section 564(b)(1) of the Act, 21 U.S.C. section 360bbb-3(b)(1), unless the authorization is terminated or revoked.     Resp Syncytial Virus by PCR NEGATIVE NEGATIVE Final    Comment: (NOTE) Fact Sheet for Patients: BloggerCourse.com  Fact Sheet for Healthcare Providers: SeriousBroker.it  This test is not yet approved or cleared by the [place]  FDA and has been authorized for detection and/or diagnosis of SARS-CoV-2 by FDA under an Emergency Use Authorization (EUA). This EUA will remain in effect (meaning this test can be used) for the duration of the COVID-19 declaration under Section 564(b)(1) of the Act, 21 U.S.C. section 360bbb-3(b)(1), unless the authorization is terminated or  revoked.  Performed at [facility], [address], [place], [place] [address]          Radiology Studies: No results found.       Scheduled Meds:  amLODipine   5 mg Oral Daily   budesonide  (PULMICORT ) nebulizer solution  0.25 mg Nebulization BID   doxycycline   100 mg Oral Q12H   enoxaparin  (LOVENOX ) injection  40 mg Subcutaneous Q24H   guaiFENesin   600 mg Oral BID   ipratropium-albuterol   3 mL Nebulization Q6H   methylPREDNISolone  (SOLU-MEDROL ) injection  40 mg Intravenous Q12H   Continuous Infusions:   LOS: 1 day    Time spent: 35 minutes.    Leatrice Chapel, MD  [facility] Pager #: [phone] 7PM-7AM contact night coverage as above

## 2024-01-07 NOTE — Progress Notes (Signed)
 EOS  No acute events overnight Pt was awake often  Wearing o2 via nasal cannula for comfort

## 2024-01-07 NOTE — Evaluation (Signed)
 Occupational Therapy Evaluation Patient Details Name: Rosendo Couser MRN: [id number] DOB: 1956/01/23 Today's Date: 01/07/2024   History of Present Illness   Pt is a 68 y.o. M who presents 10/17 with acute COPD exacerbation. UDS positive for cocaine. PMH:  HTN, Asthma, COPD, tobacco abuse, HLD, cocaine abuse , ETOH abuse, hep C     Clinical Impressions Pt admitted based on above, and was seen based on problem list below. PTA pt was independent with ADLs and IADLs. Today pt is at his functional baseline for ADLs and mobility. Pt completed standing ADLs, LB dressing, and ambulated 334ft with no AD, no LOB only mild deficits observed. Pt requesting to mobilize with supplemental O2 on 1L, after initial 110ft, pt still sustaining 99%. Ambulated additional 171ft on RA, O2 sustaining 95% or greater, max HR 85 BPM, left on RA. Pt reporting no self-care concerns or mobility concerns upon d/c, no follow up therapy needs. Pt at functional baseline, no further acute therapy needs. Pt can d/c when medically appropriate. OT is signing off on this pt, please re-consult as necessary.     If plan is discharge home, recommend the following:   Assist for transportation     Functional Status Assessment   Patient has not had a recent decline in their functional status     Equipment Recommendations   None recommended by OT      Precautions/Restrictions   Precautions Precautions: None Restrictions Weight Bearing Restrictions Per Provider Order: No     Mobility Bed Mobility Overal bed mobility: Independent     General bed mobility comments: HOB elevated, no use of rails    Transfers Overall transfer level: Independent Equipment used: None           General transfer comment: Ambulated 300 ft, no AD or LOB      Balance Overall balance assessment: Mild deficits observed, not formally tested       ADL either performed or assessed with clinical judgement   ADL Overall ADL's :  Independent     General ADL Comments: At baseline, pt performing standing ADLs, LB dressing and functional mobility ind     Vision Baseline Vision/History: 0 No visual deficits Patient Visual Report: No change from baseline Vision Assessment?: No apparent visual deficits            Pertinent Vitals/Pain Pain Assessment Pain Assessment: No/denies pain     Extremity/Trunk Assessment Upper Extremity Assessment Upper Extremity Assessment: Overall WFL for tasks assessed   Lower Extremity Assessment Lower Extremity Assessment: Overall WFL for tasks assessed   Cervical / Trunk Assessment Cervical / Trunk Assessment: Normal   Communication Communication Communication: No apparent difficulties   Cognition Arousal: Alert Behavior During Therapy: WFL for tasks assessed/performed Cognition: No apparent impairments       OT - Cognition Comments: At baseline, poor safety awareness and judgement     Following commands: Intact       Cueing  General Comments   Cueing Techniques: Verbal cues  Pt received on 1L stating at 98%, requesting to leave O2 on during mobility. Ambulated 150 ft, O2 sustaining 99%, ambulated additional 171ft on RA, O2 at 95%. Max HR 85 BPM. Left on RA           Home Living Family/patient expects to be discharged to:: Private residence Living Arrangements: Other relatives Available Help at Discharge: Family;Available 24 hours/day Type of Home: House Home Access: Stairs to enter [employer] of Steps: 2 Entrance Stairs-Rails: Can reach both Home  Layout: Two level;Able to live on main level with bedroom/bathroom     Bathroom Shower/Tub: Producer, television/film/video: Standard Bathroom Accessibility: Yes   Home Equipment: Cane - single point;Cane - Programmer, applications (2 wheels);Shower seat   Additional Comments: pt states lives with nephew      Prior Functioning/Environment Prior Level of Function : Independent/Modified  Independent             Mobility Comments: uses cane at times, walks in community ADLs Comments: ind, does not drive    OT Problem List: Cardiopulmonary status limiting activity        OT Goals(Current goals can be found in the care plan section)   Acute Rehab OT Goals Patient Stated Goal: To go home OT Goal Formulation: All assessment and education complete, DC therapy Time For Goal Achievement: 01/21/24 Potential to Achieve Goals: Good   AM-PAC OT 6 Clicks Daily Activity     Outcome Measure Help from another person eating meals?: None Help from another person taking care of personal grooming?: None Help from another person toileting, which includes using toliet, bedpan, or urinal?: None Help from another person bathing (including washing, rinsing, drying)?: None Help from another person to put on and taking off regular upper body clothing?: None Help from another person to put on and taking off regular lower body clothing?: None 6 Click Score: 24   End of Session Nurse Communication: Mobility status  Activity Tolerance: Patient tolerated treatment well Patient left: in bed;with call bell/phone within reach  OT Visit Diagnosis: Other (comment) (cardiopulmonary)                Time: 8398-8382 OT Time Calculation (min): 16 min Charges:  OT General Charges $OT Visit: 1 Visit OT Evaluation $OT Eval Moderate Complexity: 1 Mod  Adrianne BROCKS, OT  Acute Rehabilitation Services Office [phone] Secure chat preferred   Adrianne GORMAN Savers 01/07/2024, 4:31 PM

## 2024-01-08 ENCOUNTER — Other Ambulatory Visit (HOSPITAL_COMMUNITY): Payer: Self-pay

## 2024-01-08 DIAGNOSIS — J441 Chronic obstructive pulmonary disease with (acute) exacerbation: Secondary | ICD-10-CM | POA: Diagnosis not present

## 2024-01-08 MED ORDER — AMLODIPINE BESYLATE 5 MG PO TABS
5.0000 mg | ORAL_TABLET | Freq: Every day | ORAL | 0 refills | Status: DC
  Filled 2024-01-08: qty 30, 30d supply, fill #0

## 2024-01-08 MED ORDER — IPRATROPIUM-ALBUTEROL 0.5-2.5 (3) MG/3ML IN SOLN: 3.0000 mL | Freq: Two times a day (BID) | RESPIRATORY_TRACT | Status: DC

## 2024-01-08 MED ORDER — ATORVASTATIN CALCIUM 10 MG PO TABS
10.0000 mg | ORAL_TABLET | Freq: Every day | ORAL | 0 refills | Status: DC
  Filled 2024-01-08: qty 30, 30d supply, fill #0

## 2024-01-08 MED ORDER — ALBUTEROL SULFATE HFA 108 (90 BASE) MCG/ACT IN AERS
2.0000 | INHALATION_SPRAY | RESPIRATORY_TRACT | 1 refills | Status: DC | PRN
  Filled 2024-01-08: qty 6.7, 30d supply, fill #0

## 2024-01-08 MED ORDER — BREZTRI AEROSPHERE 160-9-4.8 MCG/ACT IN AERO
2.0000 | INHALATION_SPRAY | Freq: Two times a day (BID) | RESPIRATORY_TRACT | 2 refills | Status: DC
  Filled 2024-01-08: qty 10.7, 30d supply, fill #0

## 2024-01-08 MED ORDER — DOXYCYCLINE HYCLATE 100 MG PO TABS
100.0000 mg | ORAL_TABLET | Freq: Two times a day (BID) | ORAL | 0 refills | Status: AC
  Filled 2024-01-08: qty 6, 3d supply, fill #0

## 2024-01-08 MED ORDER — FOLIC ACID 1 MG PO TABS
1.0000 mg | ORAL_TABLET | Freq: Every day | ORAL | 0 refills | Status: DC
  Filled 2024-01-08: qty 30, 30d supply, fill #0

## 2024-01-08 MED ORDER — PREDNISONE 10 MG PO TABS
ORAL_TABLET | ORAL | 0 refills | Status: DC
  Filled 2024-01-08: qty 39, 12d supply, fill #0

## 2024-01-08 MED ORDER — NICOTINE POLACRILEX 2 MG MT GUM
2.0000 mg | CHEWING_GUM | OROMUCOSAL | 0 refills | Status: DC | PRN
  Filled 2024-01-08: qty 100, fill #0

## 2024-01-08 MED ORDER — IPRATROPIUM-ALBUTEROL 0.5-2.5 (3) MG/3ML IN SOLN
3.0000 mL | Freq: Four times a day (QID) | RESPIRATORY_TRACT | 0 refills | Status: DC | PRN
  Filled 2024-01-08: qty 90, 8d supply, fill #0

## 2024-01-08 NOTE — Plan of Care (Signed)
  Problem: Education: Goal: Knowledge of General Education information will improve Description: Including pain rating scale, medication(s)/side effects and non-pharmacologic comfort measures Outcome: Progressing   Problem: Health Behavior/Discharge Planning: Goal: Ability to manage health-related needs will improve Outcome: Progressing   Problem: Clinical Measurements: Goal: Ability to maintain clinical measurements within normal limits will improve Outcome: Progressing Goal: Respiratory complications will improve Outcome: Progressing   Problem: Education: Goal: Knowledge of disease or condition will improve Outcome: Progressing   Problem: Activity: Goal: Ability to tolerate increased activity will improve Outcome: Progressing

## 2024-01-08 NOTE — Progress Notes (Signed)
 During assessment spoke with patient concerning potential for discharge today. Pt stated he is not sure he is ready. I explained that oxygen  levels were good and vital signs as well. Pt stated he did not want to come back again like he did last time. Says he has been here eight times this year for the same thing. Explained to patient that he will continue to have issues if he continues to smoke. Patient says he will take his nebs and inhaler and only smokes one cigarette. Educated on importance of stopping smoking. Pt will need a bus pass at discharge. Ann Nena Hoard, RN

## 2024-01-08 NOTE — Evaluation (Signed)
 Physical Therapy Evaluation Patient Details Name: Jason Wong MRN: [id number] DOB: [birth year] Today's Date: 01/08/2024  History of Present Illness  Pt is a 68 y.o. M who presents 10/17 with acute COPD exacerbation. UDS positive for cocaine. PMH:  HTN, Asthma, COPD, tobacco abuse, HLD, cocaine abuse , ETOH abuse, hep C  Clinical Impression  Pt is presenting at Mod I for bed mobility, sit to stand and gait. Pt with mild balance deficits and poor endurance but declines physical therapy services in an out patient setting at this time. Pt was educated that he will be able to ask his MD for PT services anytime if he changes his mind. Currently pt is presenting at baseline level of functioning and no skilled physical therapy services recommended. Pt will be discharged from skilled physical therapy services at this time; please re-consult if further needs arise.            If plan is discharge home, recommend the following: Other (comment) (as needed)     Equipment Recommendations None recommended by PT     Functional Status Assessment Patient has not had a recent decline in their functional status     Precautions / Restrictions Precautions Precautions: None Recall of Precautions/Restrictions: Intact Restrictions Weight Bearing Restrictions Per Provider Order: No      Mobility  Bed Mobility Overal bed mobility: Independent     General bed mobility comments: HOB elevated, no use of rails    Transfers Overall transfer level: Modified independent Equipment used: None       General transfer comment: slight increase in BOS    Ambulation/Gait Ambulation/Gait assistance: Modified independent (Device/Increase time) Gait Distance (Feet): 400 Feet Assistive device: None Gait Pattern/deviations: Step-through pattern, Decreased stride length Gait velocity: decreased Gait velocity interpretation: <1.8 ft/sec, indicate of risk for recurrent falls   General Gait Details: flat, mid foot  initial contact, with intermittent unsteady steps but no overt LOB. Pt O2 sats 98% during gait. Reports mild shortness of breathe at 200 ft and moderat at 400 ft.  Stairs Stairs:  (demonstrates good strength with gait/sit to stand to perform stairs per home set up)             Balance Overall balance assessment: Mild deficits observed, not formally tested       Pertinent Vitals/Pain Pain Assessment Pain Assessment: No/denies pain    Home Living Family/patient expects to be discharged to:: Private residence Living Arrangements: Other relatives Available Help at Discharge: Family;Available 24 hours/day Type of Home: House Home Access: Stairs to enter Entrance Stairs-Rails: Can reach both Entrance Stairs-Number of Steps: 2   Home Layout: Two level;Able to live on main level with bedroom/bathroom Home Equipment: [place] - single point;Cane - Programmer, applications (2 wheels);Shower seat Additional Comments: pt states lives with nephew    Prior Function Prior Level of Function : Independent/Modified Independent             Mobility Comments: uses cane at times, walks in community ADLs Comments: ind, does not drive     Extremity/Trunk Assessment   Upper Extremity Assessment Upper Extremity Assessment: Defer to OT evaluation    Lower Extremity Assessment Lower Extremity Assessment: Overall WFL for tasks assessed    Cervical / Trunk Assessment Cervical / Trunk Assessment: Normal  Communication   Communication Communication: No apparent difficulties    Cognition Arousal: Alert Behavior During Therapy: WFL for tasks assessed/performed   PT - Cognitive impairments: No apparent impairments  Following commands: Intact       Cueing Cueing Techniques: Verbal cues     General Comments General comments (skin integrity, edema, etc.): Pt ambulating on room air O2 sats 98%.        Assessment/Plan    PT Assessment Patient does not need any further PT  services         PT Goals (Current goals can be found in the Care Plan section)  Acute Rehab PT Goals PT Goal Formulation: All assessment and education complete, DC therapy     AM-PAC PT 6 Clicks Mobility  Outcome Measure Help needed turning from your back to your side while in a flat bed without using bedrails?: None Help needed moving from lying on your back to sitting on the side of a flat bed without using bedrails?: None Help needed moving to and from a bed to a chair (including a wheelchair)?: None Help needed standing up from a chair using your arms (e.g., wheelchair or bedside chair)?: None Help needed to walk in hospital room?: None Help needed climbing 3-5 steps with a railing? : None 6 Click Score: 24    End of Session Equipment Utilized During Treatment: Gait belt Activity Tolerance: Patient tolerated treatment well Patient left: in bed;with call bell/phone within reach Nurse Communication: Mobility status      Time: 8865-8855 PT Time Calculation (min) (ACUTE ONLY): 10 min   Charges:   PT Evaluation $PT Eval Low Complexity: 1 Low   PT General Charges $$ ACUTE PT VISIT: 1 Visit        Jason Wong, DPT, CLT  Acute Rehabilitation Services Office: [phone] (Secure chat preferred)   Jason Wong 01/08/2024, 11:49 AM

## 2024-01-08 NOTE — Discharge Summary (Addendum)
 Physician Discharge Summary  Patient ID: Jason Wong MRN: [id number] DOB/AGE: 09-21-1955 68 y.o.  Admit date: 01/05/2024 Discharge date: 01/08/2024  Admission Diagnoses:  Discharge Diagnoses:  Principal Problem:   COPD exacerbation (HCC) Active Problems:   Acute hypoxemic respiratory failure (HCC)   Polysubstance abuse (HCC)   HTN (hypertension)   HLD (hyperlipidemia)   Discharged Condition: stable  Hospital Course:  Patient is a 68 year old male with past medical COPD, hypertension, hyperlipidemia, hepatitis C and polysubstance abuse (crack cocaine, tobacco).  Patient presented with shortness of breath.  Patient was admitted and managed for COPD with exacerbation.  Patient was managed with IV Solu-Medrol  and nebulizer treatment.  Patient has been optimized.  Patient continues to smoke cigarettes.  Patient has been advised to quit cigarette use.  Patient will follow-up with primary care provider and pulmonary team on discharge.  Acute hypoxemic respiratory failure secondary to acute COPD exacerbation - Managed reviewed IV Solu-Medrol , nebs Pulmicort  and DuoNeb. -Complete course of doxycycline .   -Follow with primary care provider and pulmonary team on discharge.  Hypertension - Patient will be discharged back home on amlodipine  5 mg p.o. once daily.   Hyperlipidemia LDL of 120. Diet and exercise.   Polysubstance abuse Reports ongoing crack cocaine abuse and smokes 1 cigarette a day.   Counseled to quit cigarette smoking and drugs.     Hypophosphatemia: - Neutra-Phos.  Consults: None  Significant Diagnostic Studies:  PORTABLE CHEST - 1 VIEW:   COMPARISON:  12/22/2023, 09/11/2023   FINDINGS: Emphysema. No focal airspace consolidation, pleural effusion, or pneumothorax. No cardiomegaly.Aortic atherosclerosis.No acute fracture or destructive lesion.   IMPRESSION: Emphysema. No pneumonia or pulmonary edema.    Discharge Exam: Blood pressure (!) 126/91, pulse  76, temperature 98 F (36.7 C), temperature source Oral, resp. rate 20, height 5' 7 (1.702 m), weight 53.5 kg, SpO2 92%.   Disposition: Discharge disposition: 01-Home or Self Care       Discharge Instructions     Diet - low sodium heart healthy   Complete by: As directed    Increase activity slowly   Complete by: As directed       Allergies as of 01/08/2024       Reactions   Tobacco Shortness Of Breath, Other (See Comments)   CIGARETTE SMOKE TRIGGERS THE PATIENT'S RESPIRATORY ISSUES!!        Medication List     TAKE these medications    albuterol  108 (90 Base) MCG/ACT inhaler Commonly known as: VENTOLIN  HFA Inhale 2 puffs into the lungs every 4 (four) hours as needed for wheezing or shortness of breath.   amLODipine  5 MG tablet Commonly known as: NORVASC  Take 1 tablet (5 mg total) by mouth daily. Start taking on: January 09, 2024   atorvastatin  10 MG tablet Commonly known as: LIPITOR Take 1 tablet (10 mg total) by mouth daily.   Breztri  Aerosphere 160-9-4.8 MCG/ACT Aero inhaler Generic drug: budesonide -glycopyrrolate -formoterol  Inhale 2 puffs into the lungs 2 (two) times daily.   doxycycline  100 MG tablet Commonly known as: VIBRA -TABS Take 1 tablet (100 mg total) by mouth every 12 (twelve) hours for 3 days.   folic acid  1 MG tablet Commonly known as: FOLVITE  Take 1 tablet (1 mg total) by mouth daily.   ipratropium-albuterol  0.5-2.5 (3) MG/3ML Soln Commonly known as: DUONEB Take 3 mLs by nebulization every 6 (six) hours as needed (shortness of breath , wheezing).   nicotine  polacrilex 2 MG gum Commonly known as: NICORETTE  Take 1 each (2 mg total) by  mouth as needed for smoking cessation.   predniSONE  10 MG tablet Commonly known as: DELTASONE  Prednisone  60 mg p.o. once daily for 3 days, then 40 mg p.o. once daily for 3 days, then 20 mg p.o. once daily for 3 days, then 10 mg p.o. once daily for 3 days and stop.       Time spent: 35  minutes.  SignedBETHA Leatrice LILLETTE Rosario 01/08/2024, 1:13 PM

## 2024-01-09 ENCOUNTER — Telehealth: Payer: Self-pay

## 2024-01-09 NOTE — Transitions of Care (Post Inpatient/ED Visit) (Signed)
   01/09/2024  Name: Jason Wong MRN: [id number] DOB: 07/03/1955  Today's TOC FU Call Status: Today's TOC FU Call Status:: Unsuccessful Call (1st Attempt) Unsuccessful Call (1st Attempt) Date: 01/09/24  Attempted to reach the patient regarding the most recent Inpatient/ED visit.  Follow Up Plan: Additional outreach attempts will be made to reach the patient to complete the Transitions of Care (Post Inpatient/ED visit) call.   Arvin Seip RN, BSN, CCM [employer], Population Health Case Manager Phone: [phone]

## 2024-01-10 ENCOUNTER — Other Ambulatory Visit: Payer: Self-pay

## 2024-01-11 ENCOUNTER — Telehealth: Payer: Self-pay

## 2024-01-11 NOTE — Transitions of Care (Post Inpatient/ED Visit) (Signed)
   01/11/2024  Name: Jamy Cleckler MRN: [id number] DOB: 17-Nov-1955  Today's TOC FU Call Status: Today's TOC FU Call Status:: Unsuccessful Call (2nd Attempt) Unsuccessful Call (2nd Attempt) Date: 01/11/24  Attempted to reach the patient regarding the most recent Inpatient/ED visit.  Follow Up Plan: Additional outreach attempts will be made to reach the patient to complete the Transitions of Care (Post Inpatient/ED visit) call.   Alan Ee, RN, BSN, CEN [employer]- Transition of Care Team.  [facility] [phone]

## 2024-01-12 ENCOUNTER — Telehealth: Payer: Self-pay

## 2024-01-12 NOTE — Transitions of Care (Post Inpatient/ED Visit) (Signed)
   01/12/2024  Name: Jason Wong MRN: [id number] DOB: 1955-06-19  Today's TOC FU Call Status: Today's TOC FU Call Status:: Unsuccessful Call (3rd Attempt) Unsuccessful Call (3rd Attempt) Date: 01/12/24  Attempted to reach the patient regarding the most recent Inpatient/ED visit.  Follow Up Plan: No further outreach attempts will be made at this time. We have been unable to contact the patient.  Alan Ee, RN, BSN, CEN [employer]- Transition of Care Team.  [facility] [phone]

## 2024-01-16 ENCOUNTER — Other Ambulatory Visit: Payer: Self-pay

## 2024-01-19 ENCOUNTER — Encounter (HOSPITAL_COMMUNITY): Payer: Self-pay | Admitting: Emergency Medicine

## 2024-01-19 ENCOUNTER — Inpatient Hospital Stay (HOSPITAL_COMMUNITY)
Admission: EM | Admit: 2024-01-19 | Discharge: 2024-01-22 | DRG: 682 | Disposition: A | Discharge status: Home / Self Care | Attending: Internal Medicine | Admitting: Internal Medicine

## 2024-01-19 ENCOUNTER — Other Ambulatory Visit: Payer: Self-pay

## 2024-01-19 ENCOUNTER — Emergency Department (HOSPITAL_COMMUNITY)

## 2024-01-19 DIAGNOSIS — Z800 Family history of malignant neoplasm of digestive organs: Secondary | ICD-10-CM

## 2024-01-19 DIAGNOSIS — Z8249 Family history of ischemic heart disease and other diseases of the circulatory system: Secondary | ICD-10-CM

## 2024-01-19 DIAGNOSIS — N179 Acute kidney failure, unspecified: Principal | ICD-10-CM | POA: Diagnosis present

## 2024-01-19 DIAGNOSIS — Z9981 Dependence on supplemental oxygen: Secondary | ICD-10-CM

## 2024-01-19 DIAGNOSIS — G9341 Metabolic encephalopathy: Secondary | ICD-10-CM | POA: Diagnosis present

## 2024-01-19 DIAGNOSIS — J4489 Other specified chronic obstructive pulmonary disease: Secondary | ICD-10-CM | POA: Diagnosis present

## 2024-01-19 DIAGNOSIS — Z803 Family history of malignant neoplasm of breast: Secondary | ICD-10-CM

## 2024-01-19 DIAGNOSIS — E785 Hyperlipidemia, unspecified: Secondary | ICD-10-CM | POA: Diagnosis present

## 2024-01-19 DIAGNOSIS — G9340 Encephalopathy, unspecified: Secondary | ICD-10-CM | POA: Diagnosis present

## 2024-01-19 DIAGNOSIS — J9601 Acute respiratory failure with hypoxia: Secondary | ICD-10-CM | POA: Diagnosis present

## 2024-01-19 DIAGNOSIS — J9690 Respiratory failure, unspecified, unspecified whether with hypoxia or hypercapnia: Principal | ICD-10-CM

## 2024-01-19 DIAGNOSIS — I10 Essential (primary) hypertension: Secondary | ICD-10-CM | POA: Diagnosis present

## 2024-01-19 DIAGNOSIS — F1910 Other psychoactive substance abuse, uncomplicated: Secondary | ICD-10-CM | POA: Diagnosis present

## 2024-01-19 DIAGNOSIS — F17210 Nicotine dependence, cigarettes, uncomplicated: Secondary | ICD-10-CM | POA: Diagnosis present

## 2024-01-19 DIAGNOSIS — F1490 Cocaine use, unspecified, uncomplicated: Secondary | ICD-10-CM | POA: Diagnosis not present

## 2024-01-19 DIAGNOSIS — Z79899 Other long term (current) drug therapy: Secondary | ICD-10-CM

## 2024-01-19 DIAGNOSIS — R001 Bradycardia, unspecified: Secondary | ICD-10-CM | POA: Diagnosis present

## 2024-01-19 DIAGNOSIS — J9602 Acute respiratory failure with hypercapnia: Secondary | ICD-10-CM | POA: Diagnosis present

## 2024-01-19 DIAGNOSIS — Z7951 Long term (current) use of inhaled steroids: Secondary | ICD-10-CM

## 2024-01-19 DIAGNOSIS — Z91048 Other nonmedicinal substance allergy status: Secondary | ICD-10-CM

## 2024-01-19 DIAGNOSIS — F1410 Cocaine abuse, uncomplicated: Secondary | ICD-10-CM | POA: Diagnosis present

## 2024-01-19 LAB — I-STAT VENOUS BLOOD GAS, ED
Acid-base deficit: 16 mmol/L — ABNORMAL HIGH (ref 0.0–2.0)
Acid-base deficit: 16 mmol/L — ABNORMAL HIGH (ref 0.0–2.0)
Bicarbonate: 15.9 mmol/L — ABNORMAL LOW (ref 20.0–28.0)
Bicarbonate: 12.7 mmol/L — ABNORMAL LOW (ref 20.0–28.0)
Calcium, Ion: 1.16 mmol/L (ref 1.15–1.40)
Calcium, Ion: 0.99 mmol/L — ABNORMAL LOW (ref 1.15–1.40)
HCT: 41 % (ref 39.0–52.0)
HCT: 31 % — ABNORMAL LOW (ref 39.0–52.0)
Hemoglobin: 13.9 g/dL (ref 13.0–17.0)
Hemoglobin: 10.5 g/dL — ABNORMAL LOW (ref 13.0–17.0)
O2 Saturation: 89 %
O2 Saturation: 79 %
Potassium: 3.7 mmol/L (ref 3.5–5.1)
Potassium: 3.2 mmol/L — ABNORMAL LOW (ref 3.5–5.1)
Sodium: 138 mmol/L (ref 135–145)
Sodium: 138 mmol/L (ref 135–145)
TCO2: 18 mmol/L — ABNORMAL LOW (ref 22–32)
TCO2: 14 mmol/L — ABNORMAL LOW (ref 22–32)
pCO2, Ven: 61.2 mmHg — ABNORMAL HIGH (ref 44–60)
pCO2, Ven: 41.1 mmHg — ABNORMAL LOW (ref 44–60)
pH, Ven: 7.022 — CL (ref 7.25–7.43)
pH, Ven: 7.097 — CL (ref 7.25–7.43)
pO2, Ven: 85 mmHg — ABNORMAL HIGH (ref 32–45)
pO2, Ven: 59 mmHg — ABNORMAL HIGH (ref 32–45)

## 2024-01-19 LAB — CBC WITH DIFFERENTIAL/PLATELET
Abs Immature Granulocytes: 0.05 K/uL (ref 0.00–0.07)
Basophils Absolute: 0 K/uL (ref 0.0–0.1)
Basophils Relative: 0 %
Eosinophils Absolute: 0.1 K/uL (ref 0.0–0.5)
Eosinophils Relative: 1 %
HCT: 43.6 % (ref 39.0–52.0)
Hemoglobin: 13.3 g/dL (ref 13.0–17.0)
Immature Granulocytes: 1 %
Lymphocytes Relative: 64 %
Lymphs Abs: 6.9 K/uL — ABNORMAL HIGH (ref 0.7–4.0)
MCH: 33.6 pg (ref 26.0–34.0)
MCHC: 30.5 g/dL (ref 30.0–36.0)
MCV: 110.1 fL — ABNORMAL HIGH (ref 80.0–100.0)
Monocytes Absolute: 0.4 K/uL (ref 0.1–1.0)
Monocytes Relative: 3 %
Neutro Abs: 3.4 K/uL (ref 1.7–7.7)
Neutrophils Relative %: 31 %
Platelets: 281 K/uL (ref 150–400)
RBC: 3.96 MIL/uL — ABNORMAL LOW (ref 4.22–5.81)
RDW: 12.9 % (ref 11.5–15.5)
WBC: 10.8 K/uL — ABNORMAL HIGH (ref 4.0–10.5)
nRBC: 0 % (ref 0.0–0.2)

## 2024-01-19 LAB — COMPREHENSIVE METABOLIC PANEL WITH GFR
ALT: 52 U/L — ABNORMAL HIGH (ref 0–44)
AST: 118 U/L — ABNORMAL HIGH (ref 15–41)
Albumin: 4.1 g/dL (ref 3.5–5.0)
Alkaline Phosphatase: 64 U/L (ref 38–126)
Anion gap: 22 — ABNORMAL HIGH (ref 5–15)
BUN: 20 mg/dL (ref 8–23)
CO2: 12 mmol/L — ABNORMAL LOW (ref 22–32)
Calcium: 8.6 mg/dL — ABNORMAL LOW (ref 8.9–10.3)
Chloride: 102 mmol/L (ref 98–111)
Creatinine, Ser: 1.51 mg/dL — ABNORMAL HIGH (ref 0.61–1.24)
GFR, Estimated: 50 mL/min — ABNORMAL LOW (ref >60)
Glucose, Bld: 192 mg/dL — ABNORMAL HIGH (ref 70–99)
Potassium: 3.7 mmol/L (ref 3.5–5.1)
Sodium: 136 mmol/L (ref 135–145)
Total Bilirubin: 0.7 mg/dL (ref 0.0–1.2)
Total Protein: 6.7 g/dL (ref 6.5–8.1)

## 2024-01-19 LAB — I-STAT CG4 LACTIC ACID, ED
Lactic Acid, Venous: 10.4 mmol/L (ref 0.5–1.9)
Lactic Acid, Venous: 7.9 mmol/L (ref 0.5–1.9)

## 2024-01-19 LAB — I-STAT CHEM 8, ED
BUN: 22 mg/dL (ref 8–23)
Calcium, Ion: 1.15 mmol/L (ref 1.15–1.40)
Chloride: 106 mmol/L (ref 98–111)
Creatinine, Ser: 1.5 mg/dL — ABNORMAL HIGH (ref 0.61–1.24)
Glucose, Bld: 185 mg/dL — ABNORMAL HIGH (ref 70–99)
HCT: 41 % (ref 39.0–52.0)
Hemoglobin: 13.9 g/dL (ref 13.0–17.0)
Potassium: 3.7 mmol/L (ref 3.5–5.1)
Sodium: 138 mmol/L (ref 135–145)
TCO2: 18 mmol/L — ABNORMAL LOW (ref 22–32)

## 2024-01-19 LAB — D-DIMER, QUANTITATIVE: D-Dimer, Quant: >20 ug{FEU}/mL — ABNORMAL HIGH (ref 0.00–0.50)

## 2024-01-19 LAB — TROPONIN I (HIGH SENSITIVITY): Troponin I (High Sensitivity): 11 ng/L (ref <18)

## 2024-01-19 LAB — AMMONIA: Ammonia: 22 umol/L (ref 9–35)

## 2024-01-19 MED ORDER — LACTATED RINGERS IV SOLN: INTRAVENOUS | Status: AC

## 2024-01-19 MED ORDER — ALBUTEROL SULFATE (2.5 MG/3ML) 0.083% IN NEBU
INHALATION_SOLUTION | RESPIRATORY_TRACT | Status: AC
  Administered 2024-01-19: 2.5 mg
  Filled 2024-01-19: qty 12

## 2024-01-19 MED ORDER — LORAZEPAM 2 MG/ML IJ SOLN
1.0000 mg | Freq: Once | INTRAMUSCULAR | Status: AC
Start: 2024-01-19 — End: 2024-01-19
  Administered 2024-01-19: 1 mg via INTRAVENOUS
  Filled 2024-01-19: qty 1

## 2024-01-19 MED ORDER — METRONIDAZOLE 500 MG/100ML IV SOLN
500.0000 mg | Freq: Once | INTRAVENOUS | Status: AC
  Administered 2024-01-19: 500 mg via INTRAVENOUS
  Filled 2024-01-19: qty 100

## 2024-01-19 MED ORDER — VANCOMYCIN HCL IN DEXTROSE 1-5 GM/200ML-% IV SOLN
1000.0000 mg | Freq: Once | INTRAVENOUS | Status: AC
  Administered 2024-01-20: 1000 mg via INTRAVENOUS
  Filled 2024-01-19: qty 200

## 2024-01-19 MED ORDER — LACTATED RINGERS IV BOLUS
1000.0000 mL | Freq: Once | INTRAVENOUS | Status: AC
  Administered 2024-01-19: 1000 mL via INTRAVENOUS

## 2024-01-19 MED ORDER — MAGNESIUM SULFATE 2 GM/50ML IV SOLN
2.0000 g | Freq: Once | INTRAVENOUS | Status: AC
  Administered 2024-01-19: 2 g via INTRAVENOUS
  Filled 2024-01-19: qty 50

## 2024-01-19 MED ORDER — SODIUM CHLORIDE 0.9 % IV SOLN
2.0000 g | Freq: Once | INTRAVENOUS | Status: AC
  Administered 2024-01-19: 2 g via INTRAVENOUS
  Filled 2024-01-19: qty 12.5

## 2024-01-19 MED ORDER — IPRATROPIUM BROMIDE 0.02 % IN SOLN
RESPIRATORY_TRACT | Status: AC
Start: 2024-01-19 — End: 2024-01-19
  Administered 2024-01-19: 0.5 mg
  Filled 2024-01-19: qty 5

## 2024-01-19 MED ORDER — ONDANSETRON HCL 4 MG/2ML IJ SOLN
4.0000 mg | Freq: Once | INTRAMUSCULAR | Status: AC
  Administered 2024-01-19: 4 mg via INTRAVENOUS

## 2024-01-19 NOTE — ED Triage Notes (Signed)
 Pt BIB [facility], EMS reports that pt was feeling unwell and was using Duoneb upon arrival. EMS reports that pt was unresponsive when they got to him. Pt needed transcutaneous pacing was being paced at 70 BPM @ 140 mA. Pt also received 125 solumedrol and 2 G mg, 1 duoneb, 1 mg atropine.   25 Lt IO Tibia BP 160/110 HR 70 (paced) ETCO2 74- improved to 43

## 2024-01-19 NOTE — ED Notes (Signed)
 Jason Wong [phone]

## 2024-01-19 NOTE — Progress Notes (Addendum)
 Orthopedic Tech Progress Note Patient Details:  Larenz Frasier Mar 27, 1955 [id number] LV2T unresponsive at arrival but currently in stable condition w/ ventilator . No ortho orders at this time.  Patient ID: Dayvion Sans, male   DOB: 29-Sep-1955, 68 y.o.   MRN: [id number]  Giovanni LITTIE Lukes 01/19/2024, 10:27 PM

## 2024-01-19 NOTE — ED Notes (Signed)
 Jason Wong [phone]

## 2024-01-19 NOTE — ED Provider Notes (Signed)
 [place] EMERGENCY DEPARTMENT AT [facility] Provider Note   CSN: [id number] Arrival date & time: 01/19/24  2126     Patient presents with: Jason Wong is a 68 y.o. male.   HPI   68 year old male with medical history significant for COPD, HTN, HLD, hepatitis C, polysubstance abuse (crack cocaine and tobacco) who was recently just admitted from 10/17 to 01/08/2024 for COPD exacerbation who presented to the emergency department with altered mental status, bradycardia, shortness of breath.  EMS was called out as the patient had been feeling unwell.  EMS found the patient to be using a DuoNeb on arrival.  He was unresponsive with a arrived on scene.  He was found to be bradycardic with heart rates in the 30s, appeared to be pericode, never lost pulses according to EMS, needed transcutaneous pacing.  Was administered 125 mg of IV Solu-Medrol , 2 g of IV magnesium , 1 DuoNeb and 1 mg of atropine with EMS.  He arrived with an IO in place in the left tibia, BP 160/110, heart rate 70s actively being paced.  Initial end-tidal CO2 with EMS was 74 but improved to 43 with treatment.  He arrived initially a GCS of 8, then improved to 10 and rapidly improved to 15 with bag-valve-mask ventilation.  His pacing was stopped and he was found to be in sinus tachycardia.  He denies any pain, endorses dyspnea.  The patient endorses utilizing crack cocaine tonight.  He denies chest pain.  Prior to Admission medications   Medication Sig Start Date End Date Taking? Authorizing Provider  albuterol  (VENTOLIN  HFA) 108 (90 Base) MCG/ACT inhaler Inhale 2 puffs into the lungs every 4 (four) hours as needed for wheezing or shortness of breath. 01/08/24   Rosario Leatrice FERNS, MD  amLODipine  (NORVASC ) 5 MG tablet Take 1 tablet (5 mg total) by mouth daily. 01/09/24   Rosario Leatrice FERNS, MD  atorvastatin  (LIPITOR) 10 MG tablet Take 1 tablet (10 mg total) by mouth daily. 01/08/24 02/07/24  Rosario Leatrice FERNS, MD  budesonide -glycopyrrolate -formoterol  (BREZTRI  AEROSPHERE) 160-9-4.8 MCG/ACT AERO inhaler Inhale 2 puffs into the lungs 2 (two) times daily. 01/08/24   Rosario Leatrice FERNS, MD  folic acid  (FOLVITE ) 1 MG tablet Take 1 tablet (1 mg total) by mouth daily. 01/08/24   Rosario Leatrice I, MD  ipratropium-albuterol  (DUONEB) 0.5-2.5 (3) MG/3ML SOLN Take 3 mLs by nebulization every 6 (six) hours as needed (shortness of breath , wheezing). 01/08/24   Rosario Leatrice FERNS, MD  nicotine  polacrilex (NICORETTE ) 2 MG gum Take 1 each (2 mg total) by mouth as needed for smoking cessation. 01/08/24   Rosario Leatrice FERNS, MD  predniSONE  (DELTASONE ) 10 MG tablet Take 6 tablets (60 mg total) by mouth daily for 3 days, THEN 4 tablets (40 mg total) daily for 3 days, THEN 2 tablets (20 mg total) daily for 3 days, THEN 1 tablet (10 mg total) daily for 3 days, THEN stop 01/08/24 01/20/24  Rosario Leatrice FERNS, MD    Allergies: Tobacco    Review of Systems  Unable to perform ROS: Acuity of condition    Updated Vital Signs Pulse (!) 110   Temp (!) 97 F (36.1 C) (Temporal)   Resp 20   Ht 5' 7 (1.702 m)   Wt 53.2 kg   SpO2 100%   BMI 18.38 kg/m   Physical Exam Vitals and nursing note reviewed.  Constitutional:      General: He is in acute distress.  Appearance: He is ill-appearing.     Comments: GCS 8, actively being paced and resuscitated by BVM  HENT:     Head: Normocephalic and atraumatic.  Eyes:     Conjunctiva/sclera: Conjunctivae normal.     Pupils: Pupils are equal, round, and reactive to light.  Cardiovascular:     Rate and Rhythm: Regular rhythm. Tachycardia present.     Comments: After pacing stopped, sinus tachycardia noted, bounding femoral pulse on the left Pulmonary:     Effort: Pulmonary effort is normal. No respiratory distress.  Abdominal:     General: There is no distension.     Tenderness: There is no abdominal tenderness. There is no guarding.  Musculoskeletal:         General: No deformity or signs of injury.     Cervical back: Neck supple.     Comments: L tibial IO in place  Skin:    Findings: No lesion or rash.  Neurological:     Mental Status: He is disoriented.     Comments: GCS initially 8, improved to 10 and subsequently 15.  Withdraws to painful stimuli all 4 extremities, appears to be moving all 4 extremities spontaneously     (all labs ordered are listed, but only abnormal results are displayed) Labs Reviewed  CBC WITH DIFFERENTIAL/PLATELET - Abnormal; Notable for the following components:      Result Value   WBC 10.8 (*)    RBC 3.96 (*)    MCV 110.1 (*)    Lymphs Abs 6.9 (*)    All other components within normal limits  I-STAT CHEM 8, ED - Abnormal; Notable for the following components:   Creatinine, Ser 1.50 (*)    Glucose, Bld 185 (*)    TCO2 18 (*)    All other components within normal limits  I-STAT VENOUS BLOOD GAS, ED - Abnormal; Notable for the following components:   pH, Ven 7.022 (*)    pCO2, Ven 61.2 (*)    pO2, Ven 85 (*)    Bicarbonate 15.9 (*)    TCO2 18 (*)    Acid-base deficit 16.0 (*)    All other components within normal limits  I-STAT CG4 LACTIC ACID, ED - Abnormal; Notable for the following components:   Lactic Acid, Venous 10.4 (*)    All other components within normal limits  CULTURE, BLOOD (ROUTINE X 2)  CULTURE, BLOOD (ROUTINE X 2)  D-DIMER, QUANTITATIVE  AMMONIA  URINALYSIS, W/ REFLEX TO CULTURE (INFECTION SUSPECTED)  RAPID URINE DRUG SCREEN, HOSP PERFORMED  COMPREHENSIVE METABOLIC PANEL WITH GFR  CBG MONITORING, ED  I-STAT VENOUS BLOOD GAS, ED  TROPONIN I (HIGH SENSITIVITY)    EKG: EKG Interpretation Date/Time:  Friday January 19 2024 21:33:49 EDT Ventricular Rate:  103 PR Interval:  150 QRS Duration:  103 QT Interval:  306 QTC Calculation: 401 R Axis:   81  Text Interpretation: Sinus tachycardia Right atrial enlargement Borderline right axis deviation Borderline ST depression,  diffuse leads Confirmed by Jerrol Agent ([id number]) on 01/19/2024 9:42:19 PM  Radiology: DG Chest Portable 1 View Result Date: 01/19/2024 EXAM: 1 VIEW XRAY OF THE CHEST 01/19/2024 09:56:00 PM COMPARISON: None available. CLINICAL HISTORY: SOB SOB FINDINGS: LUNGS AND PLEURA: No focal pulmonary opacity. No pulmonary edema. No pleural effusion. No pneumothorax. HEART AND MEDIASTINUM: No acute abnormality of the cardiac and mediastinal silhouettes. BONES AND SOFT TISSUES: No acute osseous abnormality. IMPRESSION: 1. No acute process. Electronically signed by: Franky Crease MD 01/19/2024 09:59 PM EDT RP Workstation:  [id number]     .Critical Care  Performed by: Jerrol Agent, MD Authorized by: Jerrol Agent, MD   Critical care provider statement:    Critical care time (minutes):  91   Critical care was necessary to treat or prevent imminent or life-threatening deterioration of the following conditions:  Respiratory failure   Critical care was time spent personally by me on the following activities:  Development of treatment plan with patient or surrogate, discussions with consultants, evaluation of patient's response to treatment, examination of patient, ordering and review of laboratory studies, ordering and review of radiographic studies, ordering and performing treatments and interventions, pulse oximetry, re-evaluation of patient's condition and review of old charts   Care discussed with: admitting provider      Medications Ordered in the ED  magnesium  sulfate IVPB 2 g 50 mL (2 g Intravenous New Bag/Given 01/19/24 2155)  ipratropium (ATROVENT ) 0.02 % nebulizer solution (0.5 mg  Given 01/19/24 2143)  albuterol  (PROVENTIL ) (2.5 MG/3ML) 0.083% nebulizer solution (2.5 mg  Given 01/19/24 2143)  lactated ringers  bolus 1,000 mL (1,000 mLs Intravenous New Bag/Given 01/19/24 2153)  ondansetron  (ZOFRAN ) injection 4 mg (4 mg Intravenous Given 01/19/24 2147)  LORazepam  (ATIVAN ) injection 1 mg (1 mg  Intravenous Given 01/19/24 2212)                                    Medical Decision Making Amount and/or Complexity of Data Reviewed Labs: ordered. Radiology: ordered. ECG/medicine tests: ordered.  Risk Prescription drug management.    68 year old male with medical history significant for COPD, HTN, HLD, hepatitis C, polysubstance abuse (crack cocaine and tobacco) who was recently just admitted from 10/17 to 01/08/2024 for COPD exacerbation who presented to the emergency department with altered mental status, bradycardia, shortness of breath.  EMS was called out as the patient had been feeling unwell.  EMS found the patient to be using a DuoNeb on arrival.  He was unresponsive with a arrived on scene.  He was found to be bradycardic with heart rates in the 30s, appeared to be pericode, never lost pulses according to EMS, needed transcutaneous pacing.  Was administered 125 mg of IV Solu-Medrol , 2 g of IV magnesium , 1 DuoNeb and 1 mg of atropine with EMS.  He arrived with an IO in place in the left tibia, BP 160/110, heart rate 70s actively being paced.  Initial end-tidal CO2 with EMS was 74 but improved to 43 with treatment.  He arrived initially a GCS of 8, then improved to 10 and rapidly improved to 15 with bag-valve-mask ventilation.  His pacing was stopped and he was found to be in sinus tachycardia.  He denies any pain, endorses dyspnea.  The patient endorses utilizing crack cocaine tonight.  He denies chest pain.  CBG on arrival was hyperglycemic, blood glucose 185.  No significant electrolyte abnormality on i-STAT, creatinine 1.5.   Vitals on arrival, temperature 97, heart rate 110, respiratory rate 12, saturating 100% on 15 L O2.  The patient was started on continuous albuterol  and Atrovent  nebulizer treatment.  Initial VBG revealed a mixed metabolic and respiratory acidosis and the patient was escalated to BiPAP as his mental status had improved enough to tolerate BiPAP. Considered PE,  ACS, PNA, PTX, infectious process, COPD exacerbation.   EKG: Sinus tachycardia, ventricular rate 103, no significant abnormal intervals, borderline ST depression, no STEMI.  Chest x-ray: No acute process.  Labs: VBG with  a mixed metabolic and respiratory acidosis, pH 7.022, pCO2 61, HCO3 15.9.  Lactic acid elevated to 10.4.  An IV fluid bolus was administered.  His most recent echocardiogram in the past year showed a normal EF.  Initial cardiac troponin was 11.  CBC with a leukocytosis to 10.8, no anemia hemoglobin 13.3.  Blood cultures collected and pending. Repeat lactic elevated but down-trending after fluids, will continue to trend. Repeat VBG showed resolution in respiratory acidosis but persistent metabolic acidosis, likely secondary to lactic acidosis. Will cover with broad spectrum antibiotics. Dimer elevated greater than 20, will add on CTA PE study to further evaluate. Pt vitally stable on BiPAP, appears euvolemic, vitally improved on repeat assessment, will need admission following CT Head and PE study. Signout given to [employer], [employer] at 0000, ultimate disposition pending results of diagnostic testing and reassessment.      Final diagnoses:  None    ED Discharge Orders     None          Jerrol Agent, MD 01/20/24 [phone]

## 2024-01-20 ENCOUNTER — Emergency Department (HOSPITAL_COMMUNITY)

## 2024-01-20 DIAGNOSIS — G9340 Encephalopathy, unspecified: Secondary | ICD-10-CM | POA: Diagnosis not present

## 2024-01-20 DIAGNOSIS — J9602 Acute respiratory failure with hypercapnia: Secondary | ICD-10-CM | POA: Diagnosis present

## 2024-01-20 DIAGNOSIS — F1490 Cocaine use, unspecified, uncomplicated: Secondary | ICD-10-CM | POA: Diagnosis present

## 2024-01-20 DIAGNOSIS — N179 Acute kidney failure, unspecified: Secondary | ICD-10-CM | POA: Diagnosis present

## 2024-01-20 DIAGNOSIS — G9341 Metabolic encephalopathy: Secondary | ICD-10-CM | POA: Diagnosis present

## 2024-01-20 DIAGNOSIS — I10 Essential (primary) hypertension: Secondary | ICD-10-CM | POA: Diagnosis present

## 2024-01-20 DIAGNOSIS — F1410 Cocaine abuse, uncomplicated: Secondary | ICD-10-CM | POA: Diagnosis present

## 2024-01-20 DIAGNOSIS — Z7951 Long term (current) use of inhaled steroids: Secondary | ICD-10-CM | POA: Diagnosis not present

## 2024-01-20 DIAGNOSIS — Z8249 Family history of ischemic heart disease and other diseases of the circulatory system: Secondary | ICD-10-CM | POA: Diagnosis not present

## 2024-01-20 DIAGNOSIS — E785 Hyperlipidemia, unspecified: Secondary | ICD-10-CM | POA: Diagnosis present

## 2024-01-20 DIAGNOSIS — Z79899 Other long term (current) drug therapy: Secondary | ICD-10-CM | POA: Diagnosis not present

## 2024-01-20 DIAGNOSIS — Z9981 Dependence on supplemental oxygen: Secondary | ICD-10-CM | POA: Diagnosis not present

## 2024-01-20 DIAGNOSIS — J4489 Other specified chronic obstructive pulmonary disease: Secondary | ICD-10-CM | POA: Diagnosis present

## 2024-01-20 DIAGNOSIS — Z91048 Other nonmedicinal substance allergy status: Secondary | ICD-10-CM | POA: Diagnosis not present

## 2024-01-20 DIAGNOSIS — Z800 Family history of malignant neoplasm of digestive organs: Secondary | ICD-10-CM | POA: Diagnosis not present

## 2024-01-20 DIAGNOSIS — F1910 Other psychoactive substance abuse, uncomplicated: Secondary | ICD-10-CM | POA: Diagnosis not present

## 2024-01-20 DIAGNOSIS — J9601 Acute respiratory failure with hypoxia: Secondary | ICD-10-CM | POA: Diagnosis present

## 2024-01-20 DIAGNOSIS — Z803 Family history of malignant neoplasm of breast: Secondary | ICD-10-CM | POA: Diagnosis not present

## 2024-01-20 DIAGNOSIS — R001 Bradycardia, unspecified: Secondary | ICD-10-CM | POA: Diagnosis present

## 2024-01-20 DIAGNOSIS — F17210 Nicotine dependence, cigarettes, uncomplicated: Secondary | ICD-10-CM | POA: Diagnosis present

## 2024-01-20 LAB — BLOOD GAS, VENOUS
Acid-Base Excess: 1.5 mmol/L (ref 0.0–2.0)
Bicarbonate: 27.2 mmol/L (ref 20.0–28.0)
O2 Saturation: 53 %
Patient temperature: 37
pCO2, Ven: 46 mmHg (ref 44–60)
pH, Ven: 7.38 (ref 7.25–7.43)
pO2, Ven: 32 mmHg (ref 32–45)

## 2024-01-20 LAB — PROTIME-INR
INR: 2.4 — ABNORMAL HIGH (ref 0.8–1.2)
Prothrombin Time: 27.5 s — ABNORMAL HIGH (ref 11.4–15.2)

## 2024-01-20 LAB — RAPID URINE DRUG SCREEN, HOSP PERFORMED
Amphetamines: NOT DETECTED
Barbiturates: NOT DETECTED
Benzodiazepines: NOT DETECTED
Cocaine: POSITIVE — AB
Opiates: NOT DETECTED
Tetrahydrocannabinol: POSITIVE — AB

## 2024-01-20 LAB — BASIC METABOLIC PANEL WITH GFR
Anion gap: 14 (ref 5–15)
BUN: 20 mg/dL (ref 8–23)
CO2: 20 mmol/L — ABNORMAL LOW (ref 22–32)
Calcium: 8.9 mg/dL (ref 8.9–10.3)
Chloride: 107 mmol/L (ref 98–111)
Creatinine, Ser: 1.39 mg/dL — ABNORMAL HIGH (ref 0.61–1.24)
GFR, Estimated: 55 mL/min — ABNORMAL LOW (ref >60)
Glucose, Bld: 86 mg/dL (ref 70–99)
Potassium: 4.8 mmol/L (ref 3.5–5.1)
Sodium: 141 mmol/L (ref 135–145)

## 2024-01-20 LAB — URINALYSIS, W/ REFLEX TO CULTURE (INFECTION SUSPECTED)
Bacteria, UA: NONE SEEN
Bilirubin Urine: NEGATIVE
Glucose, UA: NEGATIVE mg/dL
Ketones, ur: NEGATIVE mg/dL
Leukocytes,Ua: NEGATIVE
Nitrite: NEGATIVE
Protein, ur: 30 mg/dL — AB
Specific Gravity, Urine: 1.013 (ref 1.005–1.030)
pH: 5 (ref 5.0–8.0)

## 2024-01-20 LAB — I-STAT CG4 LACTIC ACID, ED
Lactic Acid, Venous: 4.7 mmol/L (ref 0.5–1.9)
Lactic Acid, Venous: 1.4 mmol/L (ref 0.5–1.9)

## 2024-01-20 LAB — MRSA NEXT GEN BY PCR, NASAL: MRSA by PCR Next Gen: NOT DETECTED

## 2024-01-20 LAB — TROPONIN I (HIGH SENSITIVITY): Troponin I (High Sensitivity): 43 ng/L — ABNORMAL HIGH (ref <18)

## 2024-01-20 MED ORDER — ATORVASTATIN CALCIUM 10 MG PO TABS
10.0000 mg | ORAL_TABLET | Freq: Every day | ORAL | Status: DC
  Administered 2024-01-21 – 2024-01-22 (×2): 10 mg via ORAL
  Filled 2024-01-20 (×2): qty 1

## 2024-01-20 MED ORDER — BUDESON-GLYCOPYRROL-FORMOTEROL 160-9-4.8 MCG/ACT IN AERO
2.0000 | INHALATION_SPRAY | Freq: Two times a day (BID) | RESPIRATORY_TRACT | Status: DC
Start: 2024-01-20 — End: 2024-01-22
  Administered 2024-01-21 – 2024-01-22 (×4): 2 via RESPIRATORY_TRACT
  Filled 2024-01-20: qty 5.9

## 2024-01-20 MED ORDER — AMLODIPINE BESYLATE 5 MG PO TABS
5.0000 mg | ORAL_TABLET | Freq: Every day | ORAL | Status: DC
  Administered 2024-01-21 – 2024-01-22 (×2): 5 mg via ORAL
  Filled 2024-01-20 (×2): qty 1

## 2024-01-20 MED ORDER — ENOXAPARIN SODIUM 40 MG/0.4ML IJ SOSY
40.0000 mg | PREFILLED_SYRINGE | Freq: Every day | INTRAMUSCULAR | Status: DC
  Administered 2024-01-21 – 2024-01-22 (×2): 40 mg via SUBCUTANEOUS
  Filled 2024-01-20 (×2): qty 0.4

## 2024-01-20 MED ORDER — IPRATROPIUM-ALBUTEROL 0.5-2.5 (3) MG/3ML IN SOLN
3.0000 mL | Freq: Four times a day (QID) | RESPIRATORY_TRACT | Status: DC | PRN
  Administered 2024-01-20: 3 mL via RESPIRATORY_TRACT
  Filled 2024-01-20: qty 3

## 2024-01-20 MED ORDER — IOHEXOL 350 MG/ML SOLN
75.0000 mL | Freq: Once | INTRAVENOUS | Status: AC | PRN
  Administered 2024-01-20: 75 mL via INTRAVENOUS

## 2024-01-20 NOTE — Plan of Care (Signed)
   Problem: Education: Goal: Knowledge of General Education information will improve Description: Including pain rating scale, medication(s)/side effects and non-pharmacologic comfort measures Outcome: Progressing   Problem: Health Behavior/Discharge Planning: Goal: Ability to manage health-related needs will improve Outcome: Progressing   Problem: Safety: Goal: Ability to remain free from injury will improve Outcome: Progressing

## 2024-01-20 NOTE — H&P (Addendum)
 History and Physical    Patient: Jason Wong FMW:[id number] DOB: [birth year] DOA: 01/19/2024 DOS: the patient was seen and examined on 01/20/2024 PCP: Vicci Barnie NOVAK, MD  Patient coming from: Home  Chief Complaint:  Chief Complaint  Patient presents with   Unresponsive   HPI: Jason Wong is a 68 y.o. male with medical history significant of COPD (active smoker), hypertension, hyperlipidemia, hepatitis C and polysubstance abuse (crack cocaine) p/w acute encephalopathy iso drug overdose.  Pt unable to provide a detailed history. From what I can gather per brief interview, pt was attempting to use crack cocaine and felt whoozy; as such, he called out for help and his nephew activated EMS. Of note, pt reported having a naltrexone in his possession, but was unable to use this medicine in time.  In the ED, pt initially tachypneic, hypoxic and hypercarbic (initially required BiPAP, but now on RA). Labs notable for Cr 1.5 (baseline 0.9-1), lactic acid 7.9-->1.4 with IVF, and WBC 10.8. VBG showed pH/CO2 7.022/60 --> 7.097/41 after BiPAP initiation. EDP requested PCU admission.   Review of Systems: As mentioned in the history of present illness. All other systems reviewed and are negative. Past Medical History:  Diagnosis Date   Asthma    COPD (chronic obstructive pulmonary disease) (HCC)    Hepatitis C antibody positive in blood    HLD (hyperlipidemia)    HTN (hypertension)    On home oxygen  therapy    per pt 08-31-2021 uses 02 about every other day   Past Surgical History:  Procedure Laterality Date   INCISE AND DRAIN ABCESS     dog bite   Social History:  reports that he has been smoking cigarettes. He has never used smokeless tobacco. He reports current alcohol use of about 2.0 standard drinks of alcohol per week. He reports that he does not currently use drugs after having used the following drugs: Crack cocaine.  Allergies  Allergen Reactions   Tobacco Shortness Of Breath  and Other (See Comments)    CIGARETTE SMOKE TRIGGERS THE PATIENT'S RESPIRATORY ISSUES!!    Family History  Problem Relation Age of Onset   Hypertension Mother    Breast cancer Sister    Hypertension Sister    Colon cancer Brother    Colon polyps Neg Hx    Esophageal cancer Neg Hx    Rectal cancer Neg Hx    Stomach cancer Neg Hx     Prior to Admission medications   Medication Sig Start Date End Date Taking? Authorizing Provider  albuterol  (VENTOLIN  HFA) 108 (90 Base) MCG/ACT inhaler Inhale 2 puffs into the lungs every 4 (four) hours as needed for wheezing or shortness of breath. 01/08/24   Rosario Leatrice FERNS, MD  amLODipine  (NORVASC ) 5 MG tablet Take 1 tablet (5 mg total) by mouth daily. 01/09/24   Rosario Leatrice FERNS, MD  atorvastatin  (LIPITOR) 10 MG tablet Take 1 tablet (10 mg total) by mouth daily. 01/08/24 02/07/24  Rosario Leatrice FERNS, MD  budesonide -glycopyrrolate -formoterol  (BREZTRI  AEROSPHERE) 160-9-4.8 MCG/ACT AERO inhaler Inhale 2 puffs into the lungs 2 (two) times daily. 01/08/24   Rosario Leatrice FERNS, MD  folic acid  (FOLVITE ) 1 MG tablet Take 1 tablet (1 mg total) by mouth daily. 01/08/24   Rosario Leatrice FERNS, MD  ipratropium-albuterol  (DUONEB) 0.5-2.5 (3) MG/3ML SOLN Take 3 mLs by nebulization every 6 (six) hours as needed (shortness of breath , wheezing). 01/08/24   Rosario Leatrice I, MD  nicotine  polacrilex (NICORETTE ) 2 MG gum Take 1 each (2 mg  total) by mouth as needed for smoking cessation. 01/08/24   Rosario Leatrice FERNS, MD  predniSONE  (DELTASONE ) 10 MG tablet Take 6 tablets (60 mg total) by mouth daily for 3 days, THEN 4 tablets (40 mg total) daily for 3 days, THEN 2 tablets (20 mg total) daily for 3 days, THEN 1 tablet (10 mg total) daily for 3 days, THEN stop 01/08/24 01/20/24  Rosario Leatrice FERNS, MD    Physical Exam: Vitals:   01/20/24 0630 01/20/24 0645 01/20/24 0700 01/20/24 0703  BP:    106/68  Pulse: 70 72 71 71  Resp: 12 12 12 12   Temp:      TempSrc:       SpO2: 99% 98% 97% 97%  Weight:      Height:       General: Alert, oriented x3, resting comfortably in no acute distress HEENT: EOMI, oropharynx clear, moist mucous membranes, hearing intact Neck: Trachea midline and no gross thyromegaly Respiratory: Lungs clear to auscultation bilaterally with normal respiratory effort; no w/r/r Cardiovascular: Regular rate and rhythm w/o m/r/g Abdomen: Soft, nontender, nondistended. Positive bowel sounds MSK: No obvious joint deformities or swelling Skin: No obvious rashes or lesions Neurologic: Awake, alert, spontaneously moves all extremities, strength intact Psychiatric: Appropriate mood and affect, conversational and cooperative   Data Reviewed:  Lab Results  Component Value Date   WBC 10.8 (H) 01/19/2024   HGB 10.5 (L) 01/19/2024   HCT 31.0 (L) 01/19/2024   MCV 110.1 (H) 01/19/2024   PLT 281 01/19/2024   Lab Results  Component Value Date   GLUCOSE 185 (H) 01/19/2024   CALCIUM  8.6 (L) 01/19/2024   NA 138 01/19/2024   K 3.2 (L) 01/19/2024   CO2 12 (L) 01/19/2024   CL 106 01/19/2024   BUN 22 01/19/2024   CREATININE 1.50 (H) 01/19/2024   Lab Results  Component Value Date   ALT 52 (H) 01/19/2024   AST 118 (H) 01/19/2024   ALKPHOS 64 01/19/2024   BILITOT 0.7 01/19/2024   Lab Results  Component Value Date   INR 2.4 (H) 01/19/2024   INR 1.0 07/24/2023   INR 1.1 06/27/2017   Radiology: CT Angio Chest PE W and/or Wo Contrast Result Date: 01/20/2024 CLINICAL DATA:  Evaluate for pulmonary embolism. EXAM: CT ANGIOGRAPHY CHEST WITH CONTRAST TECHNIQUE: Multidetector CT imaging of the chest was performed using the standard protocol during bolus administration of intravenous contrast. Multiplanar CT image reconstructions and MIPs were obtained to evaluate the vascular anatomy. RADIATION DOSE REDUCTION: This exam was performed according to the departmental dose-optimization program which includes automated exposure control, adjustment of  the mA and/or kV according to patient size and/or use of iterative reconstruction technique. CONTRAST:  75mL OMNIPAQUE  IOHEXOL  350 MG/ML SOLN COMPARISON:  September 11, 2023 FINDINGS: Cardiovascular: There is mild calcification of the aortic arch. The segmental and subsegmental lower lobe branches of the right and left pulmonary arteries are limited in evaluation secondary to motion artifact. No evidence of pulmonary embolism. Normal heart size. No pericardial effusion. Mediastinum/Nodes: No enlarged mediastinal, hilar, or axillary lymph nodes. Thyroid  gland, trachea, and esophagus demonstrate no significant findings. Lungs/Pleura: There is evidence of emphysematous lung disease involving predominantly the bilateral upper lobes. No acute infiltrate, pleural effusion or pneumothorax is identified. Upper Abdomen: There is marked severity bilateral hydronephrosis Musculoskeletal: No chest wall abnormality. No acute or significant osseous findings. Review of the MIP images confirms the above findings. IMPRESSION: 1. No evidence of pulmonary embolism or acute cardiopulmonary disease. 2. Marked  severity bilateral hydronephrosis. Electronically Signed   By: Suzen Dials M.D.   On: 01/20/2024 01:52   CT Head Wo Contrast Result Date: 01/20/2024 CLINICAL DATA:  Initial evaluation for acute mental status change, unknown cause. EXAM: CT HEAD WITHOUT CONTRAST TECHNIQUE: Contiguous axial images were obtained from the base of the skull through the vertex without intravenous contrast. RADIATION DOSE REDUCTION: This exam was performed according to the departmental dose-optimization program which includes automated exposure control, adjustment of the mA and/or kV according to patient size and/or use of iterative reconstruction technique. COMPARISON:  None Available. FINDINGS: Brain: Age-related cerebral atrophy. Patchy and confluent hypodensity involving the supratentorial cerebral white matter, consistent with chronic small  vessel ischemic disease, moderate in nature. Few scatter remote lacunar infarcts present about the deep gray nuclei. No acute intracranial hemorrhage. No acute large vessel territory infarct. No mass lesion or midline shift. No hydrocephalus or extra-axial fluid collection. Vascular: No abnormal hyperdense vessel. Scattered vascular calcifications noted within the carotid siphons. Skull: Scalp soft tissues within normal limits.  Calvarium intact. Sinuses/Orbits: Globes and orbital soft tissues within normal limits. Mild chronic mucosal thickening present about the ethmoidal air cells. Paranasal sinuses are otherwise largely clear. No significant mastoid effusion. Other: None. IMPRESSION: 1. No acute intracranial abnormality. 2. Age-related cerebral atrophy with moderate chronic small vessel ischemic disease. Electronically Signed   By: Morene Hoard M.D.   On: 01/20/2024 01:46   DG Chest Portable 1 View Result Date: 01/19/2024 EXAM: 1 VIEW XRAY OF THE CHEST 01/19/2024 09:56:00 PM COMPARISON: None available. CLINICAL HISTORY: SOB SOB FINDINGS: LUNGS AND PLEURA: No focal pulmonary opacity. No pulmonary edema. No pleural effusion. No pneumothorax. HEART AND MEDIASTINUM: No acute abnormality of the cardiac and mediastinal silhouettes. BONES AND SOFT TISSUES: No acute osseous abnormality. IMPRESSION: 1. No acute process. Electronically signed by: Franky Crease MD 01/19/2024 09:59 PM EDT RP Workstation: [id number]    Assessment and Plan: 8M h/o COPD (active smoker), hypertension, hyperlipidemia, hepatitis C and polysubstance abuse (crack cocaine) p/w hypercarbic hypoxemic respiratory failure c/b acute encephalopathy/AKI iso opioid/cocaine overdose.  Hypercarbic hypoxemic respiratory failure c/b acute encephalopathy iso opioid/cocaine overdose H/o COPD -Admit to PCU -BiPAP prn -PTA Breztri  BID and Duonebs -Defer oral steroids given low c/f AECOPD -Defer oral abx given low c/f CAP  AKI Cr 1.5  (baseline 0.9-1) -MIVF: LR at 100cc/h for 24h -Strict I&Os and daily weights (standing preferred) -F/u BMP daily -Renally dose medications for CrCl -Avoid lovenox , NSAIDs, morphine, Fleet's phosphate enema, regular insulin , contrast; no gadolinium for MRI to avoid nephrogenic systemic fibrosis -Consider renal US  and nephrology consult if worsening AKI   Advance Care Planning:   Code Status: Full Code   Consults: N/A  Family Communication: N/A  Severity of Illness: The appropriate patient status for this patient is INPATIENT. Inpatient status is judged to be reasonable and necessary in order to provide the required intensity of service to ensure the patient's safety. The patient's presenting symptoms, physical exam findings, and initial radiographic and laboratory data in the context of their chronic comorbidities is felt to place them at high risk for further clinical deterioration. Furthermore, it is not anticipated that the patient will be medically stable for discharge from the hospital within 2 midnights of admission.   * I certify that at the point of admission it is my clinical judgment that the patient will require inpatient hospital care spanning beyond 2 midnights from the point of admission due to high intensity of service, high risk for further  deterioration and high frequency of surveillance required.*   ------- I spent 55 minutes reviewing previous notes, at the bedside counseling/discussing the treatment plan, and performing clinical documentation.  Author: Marsha Ada, MD 01/20/2024 7:47 AM  For on call review [email or web address].

## 2024-01-20 NOTE — ED Provider Notes (Signed)
 1:48 AM VSS. Lactic acid improving. Now 4.7, down from 10.4 on arrival. CT imaging recently completed.  Appears comfortable on bedside assessment. Resting/sleeping on BiPAP. No signs of distress. May consider weaning BiPAP if imaging reassuring.  2:08 AM CT imaging negative for acute pathology.  2:23 AM Patient transitioned from BiPAP to 2L O2 via [place].  3:24 AM Case discussed with Dr. Alfornia of [facility]. Patient to be assessed for admission by AM hospitalist team.   Keith Sor, PA-C 01/20/24 0325    Carita Senior, MD 01/20/24 [phone]

## 2024-01-20 NOTE — Sepsis Progress Note (Signed)
 Elink monitoring for the code sepsis protocol.

## 2024-01-21 DIAGNOSIS — J9601 Acute respiratory failure with hypoxia: Secondary | ICD-10-CM | POA: Diagnosis not present

## 2024-01-21 DIAGNOSIS — G9341 Metabolic encephalopathy: Secondary | ICD-10-CM | POA: Diagnosis not present

## 2024-01-21 DIAGNOSIS — F1910 Other psychoactive substance abuse, uncomplicated: Secondary | ICD-10-CM | POA: Diagnosis not present

## 2024-01-21 DIAGNOSIS — F1490 Cocaine use, unspecified, uncomplicated: Secondary | ICD-10-CM

## 2024-01-21 DIAGNOSIS — J9602 Acute respiratory failure with hypercapnia: Secondary | ICD-10-CM

## 2024-01-21 DIAGNOSIS — N179 Acute kidney failure, unspecified: Secondary | ICD-10-CM

## 2024-01-21 LAB — COMPREHENSIVE METABOLIC PANEL WITH GFR
ALT: 29 U/L (ref 0–44)
AST: 24 U/L (ref 15–41)
Albumin: 3.1 g/dL — ABNORMAL LOW (ref 3.5–5.0)
Alkaline Phosphatase: 50 U/L (ref 38–126)
Anion gap: 12 (ref 5–15)
BUN: 15 mg/dL (ref 8–23)
CO2: 23 mmol/L (ref 22–32)
Calcium: 8.6 mg/dL — ABNORMAL LOW (ref 8.9–10.3)
Chloride: 106 mmol/L (ref 98–111)
Creatinine, Ser: 1.26 mg/dL — ABNORMAL HIGH (ref 0.61–1.24)
GFR, Estimated: >60 mL/min (ref >60)
Glucose, Bld: 97 mg/dL (ref 70–99)
Potassium: 4.3 mmol/L (ref 3.5–5.1)
Sodium: 141 mmol/L (ref 135–145)
Total Bilirubin: 0.6 mg/dL (ref 0.0–1.2)
Total Protein: 5.5 g/dL — ABNORMAL LOW (ref 6.5–8.1)

## 2024-01-21 LAB — CBC
HCT: 37.4 % — ABNORMAL LOW (ref 39.0–52.0)
Hemoglobin: 12.5 g/dL — ABNORMAL LOW (ref 13.0–17.0)
MCH: 34.1 pg — ABNORMAL HIGH (ref 26.0–34.0)
MCHC: 33.4 g/dL (ref 30.0–36.0)
MCV: 101.9 fL — ABNORMAL HIGH (ref 80.0–100.0)
Platelets: 217 K/uL (ref 150–400)
RBC: 3.67 MIL/uL — ABNORMAL LOW (ref 4.22–5.81)
RDW: 13.1 % (ref 11.5–15.5)
WBC: 6.6 K/uL (ref 4.0–10.5)
nRBC: 0 % (ref 0.0–0.2)

## 2024-01-21 LAB — MAGNESIUM: Magnesium: 1.7 mg/dL (ref 1.7–2.4)

## 2024-01-21 LAB — PROTIME-INR
INR: 1.1 (ref 0.8–1.2)
Prothrombin Time: 15.2 s (ref 11.4–15.2)

## 2024-01-21 MED ORDER — KETOROLAC TROMETHAMINE 15 MG/ML IJ SOLN
15.0000 mg | Freq: Once | INTRAMUSCULAR | Status: AC
  Administered 2024-01-21: 15 mg via INTRAVENOUS
  Filled 2024-01-21: qty 1

## 2024-01-21 NOTE — Progress Notes (Signed)
 Progress Note   Patient: Jason Wong FMW:[id number] DOB: 1955/08/31 DOA: 01/19/2024  DOS: the patient was seen and examined on 01/21/2024   Brief hospital course:   68 y.o. male with medical history significant of COPD (active smoker), hypertension, hyperlipidemia, hepatitis C and polysubstance abuse (crack cocaine) p/w acute encephalopathy iso drug overdose.   Assessment and Plan:  Acute hypoxic and hypercapnic respiratory failure - Likely secondary to medication overdose.  Initially requiring BiPAP.  Subsequently able to be titrated down.  Currently on room air breathing comfortably.  Nebulizers on board.  Appears resolved.  Acute metabolic encephalopathy - Multifactorial given hypercapnic/hypoxic respiratory failure and medication overdose.  Appears to be resolved.  Patient alert, talkative.  Accidental medication overdose/polysubstance abuse - Patient admits to taking cocaine/opioid.  Does not remember coming to hospital.  Encourage cessation.  Acute kidney injury - Showing improvement with IV fluid hydration.  Monitor urine output recheck kidney function in AM.  Goals of care -Showing improvement.  Pt still feeling rough.  Anticipate DC tomorrow.  Subjective: Patient states he is feeling improved this morning.  Much more alert, breathing on room air.  Admits to feeling rough, sore and weak all over.  Denies having any memory of coming to the hospital but is fully alert and oriented at this time.  Physical Exam:  Vitals:   01/21/24 0728 01/21/24 0916 01/21/24 0945 01/21/24 1124  BP: (!) 137/95  128/89 121/78  Pulse: 72 72  72  Resp: 17 15  13   Temp: 98.6 F (37 C)   99 F (37.2 C)  TempSrc: Oral   Oral  SpO2: 97% 96%  94%  Weight:      Height:        GENERAL:  Alert, pleasant, no acute distress, disheveled HEENT:  EOMI CARDIOVASCULAR:  RRR, no murmurs appreciated RESPIRATORY: Poor air movement bilaterally GASTROINTESTINAL:  Soft, nontender,  nondistended EXTREMITIES:  No LE edema bilaterally NEURO:  No new focal deficits appreciated SKIN:  No rashes noted PSYCH:  Appropriate mood and affect     Data Reviewed:  Imaging Studies: CT Angio Chest PE W and/or Wo Contrast Result Date: 01/20/2024 CLINICAL DATA:  Evaluate for pulmonary embolism. EXAM: CT ANGIOGRAPHY CHEST WITH CONTRAST TECHNIQUE: Multidetector CT imaging of the chest was performed using the standard protocol during bolus administration of intravenous contrast. Multiplanar CT image reconstructions and MIPs were obtained to evaluate the vascular anatomy. RADIATION DOSE REDUCTION: This exam was performed according to the departmental dose-optimization program which includes automated exposure control, adjustment of the mA and/or kV according to patient size and/or use of iterative reconstruction technique. CONTRAST:  75mL OMNIPAQUE  IOHEXOL  350 MG/ML SOLN COMPARISON:  September 11, 2023 FINDINGS: Cardiovascular: There is mild calcification of the aortic arch. The segmental and subsegmental lower lobe branches of the right and left pulmonary arteries are limited in evaluation secondary to motion artifact. No evidence of pulmonary embolism. Normal heart size. No pericardial effusion. Mediastinum/Nodes: No enlarged mediastinal, hilar, or axillary lymph nodes. Thyroid  gland, trachea, and esophagus demonstrate no significant findings. Lungs/Pleura: There is evidence of emphysematous lung disease involving predominantly the bilateral upper lobes. No acute infiltrate, pleural effusion or pneumothorax is identified. Upper Abdomen: There is marked severity bilateral hydronephrosis Musculoskeletal: No chest wall abnormality. No acute or significant osseous findings. Review of the MIP images confirms the above findings. IMPRESSION: 1. No evidence of pulmonary embolism or acute cardiopulmonary disease. 2. Marked severity bilateral hydronephrosis. Electronically Signed   By: Suzen Dials M.D.   On:  01/20/2024 01:52   CT Head Wo Contrast Result Date: 01/20/2024 CLINICAL DATA:  Initial evaluation for acute mental status change, unknown cause. EXAM: CT HEAD WITHOUT CONTRAST TECHNIQUE: Contiguous axial images were obtained from the base of the skull through the vertex without intravenous contrast. RADIATION DOSE REDUCTION: This exam was performed according to the departmental dose-optimization program which includes automated exposure control, adjustment of the mA and/or kV according to patient size and/or use of iterative reconstruction technique. COMPARISON:  None Available. FINDINGS: Brain: Age-related cerebral atrophy. Patchy and confluent hypodensity involving the supratentorial cerebral white matter, consistent with chronic small vessel ischemic disease, moderate in nature. Few scatter remote lacunar infarcts present about the deep gray nuclei. No acute intracranial hemorrhage. No acute large vessel territory infarct. No mass lesion or midline shift. No hydrocephalus or extra-axial fluid collection. Vascular: No abnormal hyperdense vessel. Scattered vascular calcifications noted within the carotid siphons. Skull: Scalp soft tissues within normal limits.  Calvarium intact. Sinuses/Orbits: Globes and orbital soft tissues within normal limits. Mild chronic mucosal thickening present about the ethmoidal air cells. Paranasal sinuses are otherwise largely clear. No significant mastoid effusion. Other: None. IMPRESSION: 1. No acute intracranial abnormality. 2. Age-related cerebral atrophy with moderate chronic small vessel ischemic disease. Electronically Signed   By: Morene Hoard M.D.   On: 01/20/2024 01:46   DG Chest Portable 1 View Result Date: 01/19/2024 EXAM: 1 VIEW XRAY OF THE CHEST 01/19/2024 09:56:00 PM COMPARISON: None available. CLINICAL HISTORY: SOB SOB FINDINGS: LUNGS AND PLEURA: No focal pulmonary opacity. No pulmonary edema. No pleural effusion. No pneumothorax. HEART AND MEDIASTINUM:  No acute abnormality of the cardiac and mediastinal silhouettes. BONES AND SOFT TISSUES: No acute osseous abnormality. IMPRESSION: 1. No acute process. Electronically signed by: Franky Crease MD 01/19/2024 09:59 PM EDT RP Workstation: [id number]   DG Chest Portable 1 View Result Date: 01/05/2024 CLINICAL DATA:  sob EXAM: PORTABLE CHEST - 1 VIEW COMPARISON:  12/22/2023, 09/11/2023 FINDINGS: Emphysema. No focal airspace consolidation, pleural effusion, or pneumothorax. No cardiomegaly.Aortic atherosclerosis.No acute fracture or destructive lesion. IMPRESSION: Emphysema. No pneumonia or pulmonary edema. Electronically Signed   By: Rogelia Myers M.D.   On: 01/05/2024 19:36    There are no new results to review at this time.  Previous records (including but not limited to H&P, progress notes, nursing notes, TOC management) were reviewed in assessment of this patient.  Labs: CBC: Recent Labs  Lab 01/19/24 2130 01/19/24 2138 01/19/24 2312  WBC 10.8*  --   --   NEUTROABS 3.4  --   --   HGB 13.3 13.9  13.9 10.5*  HCT 43.6 41.0  41.0 31.0*  MCV 110.1*  --   --   PLT 281  --   --    Basic Metabolic Panel: Recent Labs  Lab 01/19/24 2130 01/19/24 2138 01/19/24 2312 01/20/24 1559  NA 136 138  138 138 141  K 3.7 3.7  3.7 3.2* 4.8  CL 102 106  --  107  CO2 12*  --   --  20*  GLUCOSE 192* 185*  --  86  BUN 20 22  --  20  CREATININE 1.51* 1.50*  --  1.39*  CALCIUM  8.6*  --   --  8.9   Liver Function Tests: Recent Labs  Lab 01/19/24 2130  AST 118*  ALT 52*  ALKPHOS 64  BILITOT 0.7  PROT 6.7  ALBUMIN 4.1   CBG: No results for input(s): GLUCAP in the last 168 hours.  Scheduled Meds:  amLODipine   5 mg Oral Daily   atorvastatin   10 mg Oral Daily   budesonide -glycopyrrolate -formoterol   2 puff Inhalation BID   enoxaparin  (LOVENOX ) injection  40 mg Subcutaneous Daily   Continuous Infusions: PRN Meds:.ipratropium-albuterol   Family Communication: none at  bedside  Disposition: Status is: Inpatient Remains inpatient appropriate because: OD     Time spent: 37 minutes  Length of inpatient stay: 1 days  Author: Carliss LELON Canales, DO 01/21/2024 12:59 PM  For on call review [email or web address].

## 2024-01-21 NOTE — Plan of Care (Signed)
  Problem: Education: Goal: Knowledge of General Education information will improve Description: Including pain rating scale, medication(s)/side effects and non-pharmacologic comfort measures Outcome: Progressing   Problem: Health Behavior/Discharge Planning: Goal: Ability to manage health-related needs will improve Outcome: Progressing   Problem: Clinical Measurements: Goal: Respiratory complications will improve Outcome: Progressing   Problem: Activity: Goal: Risk for activity intolerance will decrease Outcome: Progressing   Problem: Safety: Goal: Ability to remain free from injury will improve Outcome: Progressing   

## 2024-01-21 NOTE — Hospital Course (Signed)
 68 y.o. male with medical history significant of COPD (active smoker), hypertension, hyperlipidemia, hepatitis C and polysubstance abuse (crack cocaine) p/w acute encephalopathy iso drug overdose.    Assessment and Plan:   Acute hypoxic and hypercapnic respiratory failure - Likely secondary to medication overdose.  Initially requiring BiPAP.  Subsequently able to be titrated down.  Currently on room air breathing comfortably.  Nebulizers on board.  Appears resolved.   Acute metabolic encephalopathy - Multifactorial given hypercapnic/hypoxic respiratory failure and medication overdose.  Appears to be resolved.  Patient alert, talkative.   Accidental medication overdose/polysubstance abuse - Patient admits to taking cocaine/opioid.  Does not remember coming to hospital.  Encourage cessation.   Acute kidney injury - Showing improvement with IV fluid hydration.  Monitor urine output recheck kidney function in AM.   Goals of care -Showing improvement.  Pt still feeling rough.  Anticipate DC tomorrow.

## 2024-01-21 NOTE — Evaluation (Signed)
 RT Evaluate and Treat Note  01/21/2024   Breathing is (select one): Same as normal    The following was found on auscultation (select multiple):  Bilateral Breath Sounds: Diminished;Coarse crackles (01/21/24 0916)  R Upper  Breath Sounds: Diminished (01/20/24 1937) L Upper Breath Sounds: Diminished (01/20/24 1937) R Lower Breath Sounds: Diminished (01/20/24 1937) L Lower Breath Sounds: Diminished (01/20/24 1937)    Cough Assessment: Cough: None (01/20/24 1937)    Most Recent Chest Xray:... (No results found.    The following medications and/or interventions were ordered/changed/discontinued as part of the Respiratory Treatment protocol:   Medication Changes: none   Airway Clearance Changes: none   Oxygen  Therapy Changes:none

## 2024-01-21 NOTE — Plan of Care (Signed)

## 2024-01-22 ENCOUNTER — Other Ambulatory Visit (HOSPITAL_COMMUNITY): Payer: Self-pay

## 2024-01-22 ENCOUNTER — Other Ambulatory Visit: Payer: Self-pay

## 2024-01-22 DIAGNOSIS — G9341 Metabolic encephalopathy: Secondary | ICD-10-CM | POA: Diagnosis not present

## 2024-01-22 DIAGNOSIS — F1490 Cocaine use, unspecified, uncomplicated: Secondary | ICD-10-CM | POA: Diagnosis not present

## 2024-01-22 DIAGNOSIS — N179 Acute kidney failure, unspecified: Secondary | ICD-10-CM | POA: Diagnosis not present

## 2024-01-22 DIAGNOSIS — J9601 Acute respiratory failure with hypoxia: Secondary | ICD-10-CM | POA: Diagnosis not present

## 2024-01-22 LAB — BASIC METABOLIC PANEL WITH GFR
Anion gap: 8 (ref 5–15)
BUN: 16 mg/dL (ref 8–23)
CO2: 25 mmol/L (ref 22–32)
Calcium: 8.8 mg/dL — ABNORMAL LOW (ref 8.9–10.3)
Chloride: 107 mmol/L (ref 98–111)
Creatinine, Ser: 0.99 mg/dL (ref 0.61–1.24)
GFR, Estimated: >60 mL/min (ref >60)
Glucose, Bld: 102 mg/dL — ABNORMAL HIGH (ref 70–99)
Potassium: 4 mmol/L (ref 3.5–5.1)
Sodium: 140 mmol/L (ref 135–145)

## 2024-01-22 LAB — CBC
HCT: 38.7 % — ABNORMAL LOW (ref 39.0–52.0)
Hemoglobin: 12.8 g/dL — ABNORMAL LOW (ref 13.0–17.0)
MCH: 33.5 pg (ref 26.0–34.0)
MCHC: 33.1 g/dL (ref 30.0–36.0)
MCV: 101.3 fL — ABNORMAL HIGH (ref 80.0–100.0)
Platelets: 241 K/uL (ref 150–400)
RBC: 3.82 MIL/uL — ABNORMAL LOW (ref 4.22–5.81)
RDW: 13 % (ref 11.5–15.5)
WBC: 5.5 K/uL (ref 4.0–10.5)
nRBC: 0 % (ref 0.0–0.2)

## 2024-01-22 LAB — PROTIME-INR
INR: 1 (ref 0.8–1.2)
Prothrombin Time: 13.9 s (ref 11.4–15.2)

## 2024-01-22 MED ORDER — ALBUTEROL SULFATE HFA 108 (90 BASE) MCG/ACT IN AERS
2.0000 | INHALATION_SPRAY | RESPIRATORY_TRACT | 1 refills | Status: DC | PRN
  Filled 2024-01-22: qty 6.7, 16d supply, fill #0

## 2024-01-22 NOTE — TOC Transition Note (Signed)
 Transition of Care [facility]) - Discharge Note   Patient Details  Name: Jason Wong MRN: [id number] Date of Birth: September 29, 1955  Transition of Care [facility] CM/SW Contact:  Roxie KANDICE Stain, RN Phone Number: 01/22/2024, 10:32 AM   Clinical Narrative:    Patient stable for discharge. Patient states he follows up  with PCP and uses bus transportation. PCP apt on AVS. Bus pass given.    Final next level of care: Home/Self Care Barriers to Discharge: Barriers Resolved   Patient Goals and CMS Choice Patient states their goals for this hospitalization and ongoing recovery are:: return home          Discharge Placement             home          Discharge Plan and Services Additional resources added to the After Visit Summary for                                       Social Drivers of Health (SDOH) Interventions SDOH Screenings   Food Insecurity: No Food Insecurity (01/20/2024)  Housing: Low Risk  (01/20/2024)  Transportation Needs: No Transportation Needs (01/20/2024)  Utilities: Not At Risk (01/20/2024)  Alcohol Screen: Low Risk  (12/01/2023)  Depression (PHQ2-9): Low Risk  (12/01/2023)  Financial Resource Strain: Low Risk  (12/01/2023)  Physical Activity: Inactive (12/01/2023)  Social Connections: Unknown (01/06/2024)  Recent Concern: Social Connections - Socially Isolated (12/01/2023)  Stress: No Stress Concern Present (12/01/2023)  Tobacco Use: High Risk (01/19/2024)  Health Literacy: Adequate Health Literacy (12/01/2023)     Readmission Risk Interventions    01/22/2024   10:31 AM 01/22/2024   10:30 AM 10/03/2023    3:32 PM  Readmission Risk Prevention Plan  Transportation Screening Complete Complete Complete  Medication Review (RN Care Manager) Complete  Complete  PCP or Specialist appointment within 3-5 days of discharge Complete Not Complete Complete  HRI or Home Care Consult Complete  Complete  SW Recovery Care/Counseling Consult Complete  Complete   Palliative Care Screening Not Applicable  Not Applicable  Skilled Nursing Facility Not Applicable  Not Applicable

## 2024-01-22 NOTE — Evaluation (Signed)
 RT Evaluate and Treat Note  01/22/2024   Breathing is (select one): Same as normal    The following was found on auscultation (select multiple):  Bilateral Breath Sounds: Clear;Diminished (01/22/24 0811)  R Upper  Breath Sounds: Clear (01/22/24 0811) L Upper Breath Sounds: Clear (01/22/24 0811) R Lower Breath Sounds: Diminished (01/22/24 0811) L Lower Breath Sounds: Diminished (01/22/24 0811)    Cough Assessment: Cough: Congested;Strong (01/22/24 0810)    Most Recent Chest Xray:... (No results found. 10/31 No acute process   The following medications and/or interventions were ordered/changed/discontinued as part of the Respiratory Treatment protocol:   Medication Changes: No changes   Airway Clearance Changes: No changes   Oxygen  Therapy Changes:No changes

## 2024-01-22 NOTE — Discharge Summary (Signed)
 Physician Discharge Summary   Patient: Jason Wong MRN: [id number] DOB: 1955/07/16  Admit date:     01/19/2024  Discharge date: 01/22/24  Discharge Physician: Carliss LELON Canales   PCP: Vicci Barnie NOVAK, MD   Recommendations at discharge:    Pt to be discharged home.   If you experience worsening fever, chills, chest pain, shortness of breath, or other concerning symptoms, please call your PCP or go to the emergency department immediately.  Discharge Diagnoses: Active Problems:   Polysubstance abuse (HCC)   Acute respiratory failure with hypoxia and hypercapnia (HCC)   Acute metabolic encephalopathy   AKI (acute kidney injury)   Crack cocaine use  Resolved Problems:   * No resolved hospital problems. *   Hospital Course:  68 y.o. male with medical history significant of COPD (active smoker), hypertension, hyperlipidemia, hepatitis C and polysubstance abuse (crack cocaine) p/w acute encephalopathy iso drug overdose.    Assessment and Plan:   Acute hypoxic and hypercapnic respiratory failure - Likely secondary to medication overdose.  Initially requiring BiPAP.  Subsequently able to be titrated down.  Currently on room air breathing comfortably.  Nebulizers on board.  Appears resolved.  Will give prescription for albuterol  inhaler upon discharge.   Acute metabolic encephalopathy - Multifactorial given hypercapnic/hypoxic respiratory failure and medication overdose.  Appears to be resolved.  Patient alert, talkative.   Accidental medication overdose/polysubstance abuse - Patient admits to taking cocaine/opioid.  Does not remember coming to hospital.  Encourage cessation.   Acute kidney injury - Resolved after IV fluid hydration.  Consultants: None Procedures performed: None Disposition: Home Diet recommendation:  Discharge Diet Orders (From admission, onward)     Start     Ordered   01/22/24 0000  Diet - low sodium heart healthy        01/22/24 0927            Cardiac diet  DISCHARGE MEDICATION: Allergies as of 01/22/2024       Reactions   Tobacco Shortness Of Breath, Other (See Comments)   CIGARETTE SMOKE TRIGGERS THE PATIENT'S RESPIRATORY ISSUES!!        Medication List     STOP taking these medications    predniSONE  10 MG tablet Commonly known as: DELTASONE        TAKE these medications    albuterol  108 (90 Base) MCG/ACT inhaler Commonly known as: VENTOLIN  HFA Inhale 2 puffs into the lungs every 4 (four) hours as needed for wheezing or shortness of breath.   amLODipine  5 MG tablet Commonly known as: NORVASC  Take 1 tablet (5 mg total) by mouth daily.   atorvastatin  10 MG tablet Commonly known as: LIPITOR Take 1 tablet (10 mg total) by mouth daily.   Breztri  Aerosphere 160-9-4.8 MCG/ACT Aero inhaler Generic drug: budesonide -glycopyrrolate -formoterol  Inhale 2 puffs into the lungs 2 (two) times daily. What changed:  when to take this reasons to take this   folic acid  1 MG tablet Commonly known as: FOLVITE  Take 1 tablet (1 mg total) by mouth daily.   ipratropium-albuterol  0.5-2.5 (3) MG/3ML Soln Commonly known as: DUONEB Take 3 mLs by nebulization every 6 (six) hours as needed (shortness of breath , wheezing).   nicotine  polacrilex 2 MG gum Commonly known as: NICORETTE  Take 1 each (2 mg total) by mouth as needed for smoking cessation.         Discharge Exam: Filed Weights   01/19/24 2138 01/20/24 1531  Weight: 53.2 kg 54.8 kg    GENERAL:  Alert, pleasant, no  acute distress, disheveled HEENT:  EOMI CARDIOVASCULAR:  RRR, no murmurs appreciated RESPIRATORY:  Clear to auscultation, no wheezing, rales, or rhonchi GASTROINTESTINAL:  Soft, nontender, nondistended EXTREMITIES:  No LE edema bilaterally NEURO:  No new focal deficits appreciated SKIN:  No rashes noted PSYCH:  Appropriate mood and affect     Condition at discharge: improving  The results of significant diagnostics from this  hospitalization (including imaging, microbiology, ancillary and laboratory) are listed below for reference.   Imaging Studies: CT Angio Chest PE W and/or Wo Contrast Result Date: 01/20/2024 CLINICAL DATA:  Evaluate for pulmonary embolism. EXAM: CT ANGIOGRAPHY CHEST WITH CONTRAST TECHNIQUE: Multidetector CT imaging of the chest was performed using the standard protocol during bolus administration of intravenous contrast. Multiplanar CT image reconstructions and MIPs were obtained to evaluate the vascular anatomy. RADIATION DOSE REDUCTION: This exam was performed according to the departmental dose-optimization program which includes automated exposure control, adjustment of the mA and/or kV according to patient size and/or use of iterative reconstruction technique. CONTRAST:  75mL OMNIPAQUE  IOHEXOL  350 MG/ML SOLN COMPARISON:  September 11, 2023 FINDINGS: Cardiovascular: There is mild calcification of the aortic arch. The segmental and subsegmental lower lobe branches of the right and left pulmonary arteries are limited in evaluation secondary to motion artifact. No evidence of pulmonary embolism. Normal heart size. No pericardial effusion. Mediastinum/Nodes: No enlarged mediastinal, hilar, or axillary lymph nodes. Thyroid  gland, trachea, and esophagus demonstrate no significant findings. Lungs/Pleura: There is evidence of emphysematous lung disease involving predominantly the bilateral upper lobes. No acute infiltrate, pleural effusion or pneumothorax is identified. Upper Abdomen: There is marked severity bilateral hydronephrosis Musculoskeletal: No chest wall abnormality. No acute or significant osseous findings. Review of the MIP images confirms the above findings. IMPRESSION: 1. No evidence of pulmonary embolism or acute cardiopulmonary disease. 2. Marked severity bilateral hydronephrosis. Electronically Signed   By: Suzen Dials M.D.   On: 01/20/2024 01:52   CT Head Wo Contrast Result Date:  01/20/2024 CLINICAL DATA:  Initial evaluation for acute mental status change, unknown cause. EXAM: CT HEAD WITHOUT CONTRAST TECHNIQUE: Contiguous axial images were obtained from the base of the skull through the vertex without intravenous contrast. RADIATION DOSE REDUCTION: This exam was performed according to the departmental dose-optimization program which includes automated exposure control, adjustment of the mA and/or kV according to patient size and/or use of iterative reconstruction technique. COMPARISON:  None Available. FINDINGS: Brain: Age-related cerebral atrophy. Patchy and confluent hypodensity involving the supratentorial cerebral white matter, consistent with chronic small vessel ischemic disease, moderate in nature. Few scatter remote lacunar infarcts present about the deep gray nuclei. No acute intracranial hemorrhage. No acute large vessel territory infarct. No mass lesion or midline shift. No hydrocephalus or extra-axial fluid collection. Vascular: No abnormal hyperdense vessel. Scattered vascular calcifications noted within the carotid siphons. Skull: Scalp soft tissues within normal limits.  Calvarium intact. Sinuses/Orbits: Globes and orbital soft tissues within normal limits. Mild chronic mucosal thickening present about the ethmoidal air cells. Paranasal sinuses are otherwise largely clear. No significant mastoid effusion. Other: None. IMPRESSION: 1. No acute intracranial abnormality. 2. Age-related cerebral atrophy with moderate chronic small vessel ischemic disease. Electronically Signed   By: Morene Hoard M.D.   On: 01/20/2024 01:46   DG Chest Portable 1 View Result Date: 01/19/2024 EXAM: 1 VIEW XRAY OF THE CHEST 01/19/2024 09:56:00 PM COMPARISON: None available. CLINICAL HISTORY: SOB SOB FINDINGS: LUNGS AND PLEURA: No focal pulmonary opacity. No pulmonary edema. No pleural effusion. No pneumothorax. HEART AND  MEDIASTINUM: No acute abnormality of the cardiac and mediastinal  silhouettes. BONES AND SOFT TISSUES: No acute osseous abnormality. IMPRESSION: 1. No acute process. Electronically signed by: Franky Crease MD 01/19/2024 09:59 PM EDT RP Workstation: [id number]   DG Chest Portable 1 View Result Date: 01/05/2024 CLINICAL DATA:  sob EXAM: PORTABLE CHEST - 1 VIEW COMPARISON:  12/22/2023, 09/11/2023 FINDINGS: Emphysema. No focal airspace consolidation, pleural effusion, or pneumothorax. No cardiomegaly.Aortic atherosclerosis.No acute fracture or destructive lesion. IMPRESSION: Emphysema. No pneumonia or pulmonary edema. Electronically Signed   By: Rogelia Myers M.D.   On: 01/05/2024 19:36    Microbiology: Results for orders placed or performed during the hospital encounter of 01/19/24  Blood culture (routine x 2)     Status: None (Preliminary result)   Collection Time: 01/19/24  9:44 PM   Specimen: BLOOD  Result Value Ref Range Status   Specimen Description BLOOD LEFT ANTECUBITAL  Final   Special Requests   Final    BOTTLES DRAWN AEROBIC AND ANAEROBIC Blood Culture results may not be optimal due to an inadequate volume of blood received in culture bottles   Culture   Final    NO GROWTH 3 DAYS Performed at [facility], [address] [place], [place] [address]    Report Status PENDING  Incomplete  Blood culture (routine x 2)     Status: None (Preliminary result)   Collection Time: 01/19/24  9:49 PM   Specimen: BLOOD  Result Value Ref Range Status   Specimen Description BLOOD LEFT ANTECUBITAL  Final   Special Requests   Final    BOTTLES DRAWN AEROBIC AND ANAEROBIC Blood Culture results may not be optimal due to an inadequate volume of blood received in culture bottles   Culture   Final    NO GROWTH 3 DAYS Performed at [facility], [address], [place], [place] [address]    Report Status PENDING  Incomplete  MRSA Next Gen by PCR, Nasal     Status: None   Collection Time: 01/20/24  3:42 PM   Specimen: Nasal Mucosa; Nasal Swab  Result  Value Ref Range Status   MRSA by PCR Next Gen NOT DETECTED NOT DETECTED Final    Comment: (NOTE) The GeneXpert MRSA Assay (FDA approved for NASAL specimens only), is one component of a comprehensive MRSA colonization surveillance program. It is not intended to diagnose MRSA infection nor to guide or monitor treatment for MRSA infections. Test performance is not FDA approved in patients less than 25 years old. Performed at [facility], [address] [place], [place] [address]     Labs: CBC: Recent Labs  Lab 01/19/24 2130 01/19/24 2138 01/19/24 2312 01/21/24 1407 01/22/24 0200  WBC 10.8*  --   --  6.6 5.5  NEUTROABS 3.4  --   --   --   --   HGB 13.3 13.9  13.9 10.5* 12.5* 12.8*  HCT 43.6 41.0  41.0 31.0* 37.4* 38.7*  MCV 110.1*  --   --  101.9* 101.3*  PLT 281  --   --  217 241   Basic Metabolic Panel: Recent Labs  Lab 01/19/24 2130 01/19/24 2138 01/19/24 2312 01/20/24 1559 01/21/24 1407 01/22/24 0200  NA 136 138  138 138 141 141 140  K 3.7 3.7  3.7 3.2* 4.8 4.3 4.0  CL 102 106  --  107 106 107  CO2 12*  --   --  20* 23 25  GLUCOSE 192* 185*  --  86 97 102*  BUN 20 22  --  20 15 16   CREATININE 1.51* 1.50*  --  1.39* 1.26* 0.99  CALCIUM  8.6*  --   --  8.9 8.6* 8.8*  MG  --   --   --   --  1.7  --    Liver Function Tests: Recent Labs  Lab 01/19/24 2130 01/21/24 1407  AST 118* 24  ALT 52* 29  ALKPHOS 64 50  BILITOT 0.7 0.6  PROT 6.7 5.5*  ALBUMIN 4.1 3.1*   CBG: No results for input(s): GLUCAP in the last 168 hours.  Discharge time spent: 25 minutes.  Length of inpatient stay: 2 days  Signed: Carliss LELON Canales, DO [facility] 01/22/2024

## 2024-01-23 ENCOUNTER — Telehealth: Payer: Self-pay

## 2024-01-23 NOTE — Transitions of Care (Post Inpatient/ED Visit) (Signed)
   01/23/2024  Name: Jason Wong MRN: [id number] DOB: [birth year]  Today's TOC FU Call Status: Today's TOC FU Call Status:: Unsuccessful Call (1st Attempt) Unsuccessful Call (1st Attempt) Date: 01/23/24  Attempted to reach the patient regarding the most recent Inpatient/ED visit.  Follow Up Plan: Additional outreach attempts will be made to reach the patient to complete the Transitions of Care (Post Inpatient/ED visit) call.   Signature  Slater Diesel, RN

## 2024-01-24 ENCOUNTER — Telehealth: Payer: Self-pay | Admitting: *Deleted

## 2024-01-24 LAB — CULTURE, BLOOD (ROUTINE X 2)
Culture: NO GROWTH
Culture: NO GROWTH

## 2024-01-24 NOTE — Transitions of Care (Post Inpatient/ED Visit) (Signed)
   01/24/2024  Name: Jason Wong MRN: [id number] DOB: Oct 20, 1955  Today's TOC FU Call Status: Unsuccessful Call (1st Attempt) Date: 01/24/24  Attempted to reach the patient regarding the most recent Inpatient/ED visit.  Follow Up Plan: Additional outreach attempts will be made to reach the patient to complete the Transitions of Care (Post Inpatient/ED visit) call.   Andrea Dimes RN, BSN   [facility] RN Care Manager [phone]

## 2024-01-25 ENCOUNTER — Telehealth: Payer: Self-pay | Admitting: *Deleted

## 2024-01-25 ENCOUNTER — Other Ambulatory Visit: Payer: Self-pay

## 2024-01-25 NOTE — Transitions of Care (Post Inpatient/ED Visit) (Signed)
   01/25/2024  Name: Jason Wong MRN: [id number] DOB: 02/17/1956  Today's TOC FU Call Status: Today's TOC FU Call Status:: Unsuccessful Call (2nd Attempt) Unsuccessful Call (2nd Attempt) Date: 01/25/24  Attempted to reach the patient regarding the most recent Inpatient/ED visit.  Follow Up Plan: Additional outreach attempts will be made to reach the patient to complete the Transitions of Care (Post Inpatient/ED visit) call.   Andrea Dimes RN, BSN [place]  [facility] RN Care Manager [phone]

## 2024-01-26 ENCOUNTER — Telehealth: Payer: Self-pay | Admitting: *Deleted

## 2024-01-26 NOTE — Transitions of Care (Post Inpatient/ED Visit) (Signed)
   01/26/2024  Name: Jason Wong MRN: [id number] DOB: April 25, 1955  Today's TOC FU Call Status: Today's TOC FU Call Status:: Unsuccessful Call (3rd Attempt) Unsuccessful Call (3rd Attempt) Date: 01/26/24  Attempted to reach the patient regarding the most recent Inpatient/ED visit.  Follow Up Plan: No further outreach attempts will be made at this time. We have been unable to contact the patient.  Andrea Dimes RN, BSN [place]  [facility] RN Care Manager [phone]

## 2024-02-07 ENCOUNTER — Other Ambulatory Visit: Payer: Self-pay

## 2024-02-07 ENCOUNTER — Emergency Department (HOSPITAL_COMMUNITY)

## 2024-02-07 ENCOUNTER — Emergency Department (HOSPITAL_COMMUNITY): Admission: EM | Admit: 2024-02-07 | Discharge: 2024-02-07 | Disposition: A | Discharge status: Home / Self Care

## 2024-02-07 DIAGNOSIS — R0602 Shortness of breath: Secondary | ICD-10-CM | POA: Diagnosis present

## 2024-02-07 DIAGNOSIS — Z79899 Other long term (current) drug therapy: Secondary | ICD-10-CM | POA: Insufficient documentation

## 2024-02-07 DIAGNOSIS — I10 Essential (primary) hypertension: Secondary | ICD-10-CM | POA: Insufficient documentation

## 2024-02-07 DIAGNOSIS — J441 Chronic obstructive pulmonary disease with (acute) exacerbation: Secondary | ICD-10-CM | POA: Insufficient documentation

## 2024-02-07 DIAGNOSIS — Z7951 Long term (current) use of inhaled steroids: Secondary | ICD-10-CM | POA: Insufficient documentation

## 2024-02-07 LAB — CBC
HCT: 42.4 % (ref 39.0–52.0)
Hemoglobin: 14.1 g/dL (ref 13.0–17.0)
MCH: 33.4 pg (ref 26.0–34.0)
MCHC: 33.3 g/dL (ref 30.0–36.0)
MCV: 100.5 fL — ABNORMAL HIGH (ref 80.0–100.0)
Platelets: 217 K/uL (ref 150–400)
RBC: 4.22 MIL/uL (ref 4.22–5.81)
RDW: 13.3 % (ref 11.5–15.5)
WBC: 5.7 K/uL (ref 4.0–10.5)
nRBC: 0 % (ref 0.0–0.2)

## 2024-02-07 LAB — BASIC METABOLIC PANEL WITH GFR
Anion gap: 16 — ABNORMAL HIGH (ref 5–15)
BUN: 11 mg/dL (ref 8–23)
CO2: 21 mmol/L — ABNORMAL LOW (ref 22–32)
Calcium: 9.7 mg/dL (ref 8.9–10.3)
Chloride: 106 mmol/L (ref 98–111)
Creatinine, Ser: 1.1 mg/dL (ref 0.61–1.24)
GFR, Estimated: >60 mL/min (ref >60)
Glucose, Bld: 86 mg/dL (ref 70–99)
Potassium: 4.2 mmol/L (ref 3.5–5.1)
Sodium: 143 mmol/L (ref 135–145)

## 2024-02-07 MED ORDER — IPRATROPIUM-ALBUTEROL 0.5-2.5 (3) MG/3ML IN SOLN
3.0000 mL | Freq: Once | RESPIRATORY_TRACT | Status: AC
  Administered 2024-02-07: 3 mL via RESPIRATORY_TRACT
  Filled 2024-02-07: qty 3

## 2024-02-07 MED ORDER — ALBUTEROL SULFATE HFA 108 (90 BASE) MCG/ACT IN AERS
2.0000 | INHALATION_SPRAY | Freq: Once | RESPIRATORY_TRACT | Status: AC
  Administered 2024-02-07: 2 via RESPIRATORY_TRACT
  Filled 2024-02-07: qty 6.7

## 2024-02-07 MED ORDER — PREDNISONE 50 MG PO TABS
50.0000 mg | ORAL_TABLET | Freq: Every day | ORAL | 0 refills | Status: DC
  Filled 2024-02-07: qty 3, 3d supply, fill #0

## 2024-02-07 MED ORDER — METHYLPREDNISOLONE SODIUM SUCC 125 MG IJ SOLR
125.0000 mg | Freq: Once | INTRAMUSCULAR | Status: AC
  Administered 2024-02-07: 125 mg via INTRAVENOUS
  Filled 2024-02-07: qty 2

## 2024-02-07 NOTE — ED Provider Notes (Signed)
 [place] EMERGENCY DEPARTMENT AT [facility] Provider Note   CSN: [id number] Arrival date & time: 02/07/24  9077     Patient presents with: Shortness of Breath   Jason Wong is a 68 y.o. male.   Presenting emergency department for shortness of breath.  Started yesterday.  History of COPD reportedly out of inhalers.  No rhinorrhea congestion.  No chest pain no nausea vomiting.  Received Solu-Medrol  and breathing treatment in triage.  He reports continued subjective shortness of breath.   Shortness of Breath      Prior to Admission medications   Medication Sig Start Date End Date Taking? Authorizing Provider  predniSONE  (DELTASONE ) 50 MG tablet Take 1 tablet (50 mg total) by mouth daily. 02/07/24  Yes Neysa Caron PARAS, DO  albuterol  (VENTOLIN  HFA) 108 (90 Base) MCG/ACT inhaler Inhale 2 puffs into the lungs every 4 (four) hours as needed for wheezing or shortness of breath. 01/22/24   Arlon Carliss ORN, DO  amLODipine  (NORVASC ) 5 MG tablet Take 1 tablet (5 mg total) by mouth daily. 01/09/24   Rosario Leatrice FERNS, MD  atorvastatin  (LIPITOR) 10 MG tablet Take 1 tablet (10 mg total) by mouth daily. 01/08/24 02/07/24  Rosario Leatrice FERNS, MD  budesonide -glycopyrrolate -formoterol  (BREZTRI  AEROSPHERE) 160-9-4.8 MCG/ACT AERO inhaler Inhale 2 puffs into the lungs 2 (two) times daily. Patient taking differently: Inhale 2 puffs into the lungs 2 (two) times daily as needed (Asthma). 01/08/24   Rosario Leatrice FERNS, MD  folic acid  (FOLVITE ) 1 MG tablet Take 1 tablet (1 mg total) by mouth daily. 01/08/24   Rosario Leatrice FERNS, MD  ipratropium-albuterol  (DUONEB) 0.5-2.5 (3) MG/3ML SOLN Take 3 mLs by nebulization every 6 (six) hours as needed (shortness of breath , wheezing). 01/08/24   Rosario Leatrice FERNS, MD  nicotine  polacrilex (NICORETTE ) 2 MG gum Take 1 each (2 mg total) by mouth as needed for smoking cessation. Patient not taking: Reported on 01/20/2024 01/08/24   Rosario Leatrice I,  MD    Allergies: Tobacco    Review of Systems  Respiratory:  Positive for shortness of breath.     Updated Vital Signs BP (!) 147/93   Pulse 76   Temp 98.5 F (36.9 C)   Resp 20   Ht 5' 6 (1.676 m)   Wt 56.7 kg   SpO2 96% Comment: Ambulatory  BMI 20.18 kg/m   Physical Exam Vitals and nursing note reviewed.  Constitutional:      General: He is not in acute distress.    Appearance: He is not toxic-appearing.  HENT:     Head: Normocephalic.  Cardiovascular:     Rate and Rhythm: Normal rate and regular rhythm.  Pulmonary:     Effort: Pulmonary effort is normal.     Breath sounds: No decreased breath sounds, wheezing, rhonchi or rales.  Musculoskeletal:     Cervical back: Normal range of motion.     Right lower leg: No edema.     Left lower leg: No edema.  Skin:    General: Skin is warm and dry.     Capillary Refill: Capillary refill takes less than 2 seconds.  Neurological:     Mental Status: He is alert and oriented to person, place, and time.  Psychiatric:        Mood and Affect: Mood normal.        Behavior: Behavior normal.     (all labs ordered are listed, but only abnormal results are displayed) Labs Reviewed  CBC -  Abnormal; Notable for the following components:      Result Value   MCV 100.5 (*)    All other components within normal limits  BASIC METABOLIC PANEL WITH GFR - Abnormal; Notable for the following components:   CO2 21 (*)    Anion gap 16 (*)    All other components within normal limits    EKG: EKG Interpretation Date/Time:  Wednesday February 07 2024 09:38:12 EST Ventricular Rate:  82 PR Interval:  128 QRS Duration:  82 QT Interval:  340 QTC Calculation: 397 R Axis:   84  Text Interpretation: Normal sinus rhythm Right atrial enlargement Nonspecific ST abnormality Interpretation limited secondary to artifact Confirmed by Neysa Clap [phone]) on 02/07/2024 2:52:35 PM  Radiology: DG Chest 2 View Result Date: 02/07/2024 CLINICAL  DATA:  Shortness of breath. EXAM: CHEST - 2 VIEW COMPARISON:  01/19/2024 FINDINGS: Lungs are adequately inflated and otherwise clear. Cardiomediastinal silhouette and remainder of the exam is unchanged. IMPRESSION: No active cardiopulmonary disease. Electronically Signed   By: Toribio Agreste M.D.   On: 02/07/2024 10:39     Procedures   Medications Ordered in the ED  methylPREDNISolone  sodium succinate (SOLU-MEDROL ) 125 mg/2 mL injection 125 mg (125 mg Intravenous Given 02/07/24 1005)  ipratropium-albuterol  (DUONEB) 0.5-2.5 (3) MG/3ML nebulizer solution 3 mL (3 mLs Nebulization Given 02/07/24 1005)  ipratropium-albuterol  (DUONEB) 0.5-2.5 (3) MG/3ML nebulizer solution 3 mL (3 mLs Nebulization Given 02/07/24 1549)  albuterol  (VENTOLIN  HFA) 108 (90 Base) MCG/ACT inhaler 2 puff (2 puffs Inhalation Given 02/07/24 1602)    Clinical Course as of 02/07/24 1604  Wed Feb 07, 2024  1604 Ambulated without desaturation.  Given albuterol  refill.  Will send with a few days of steroids.  Given strict return precautions.  Stable for discharge at this time. [TY]    Clinical Course User Index [TY] Neysa Clap PARAS, DO                                 Medical Decision Making This is a 68 year old male history of COPD, hypertension, hyperlipidemia, polysubstance abuse presenting the emergency department for shortness of breath.  Afebrile, nontachycardic, had some slight tachypnea and some faint wheezing in triage when triaged him.  His lungs are clear now and tachypnea improved.  He still complains of subjective shortness of breath however he is speaking in full sentences, maintaining his oxygen  saturation on room air, does not appear to be in distress.  He is seems to be quite concerned about getting a food more than treatment options.  Will attempt to ambulate.  His workup today no significant leukocytosis to suggest infectious process.  No metabolic derangements.  Chest x-ray without pneumonia.  EKG sinus with no  ischemic changes.  If patient ambulates without desaturation will discharge with refill on albuterol   Amount and/or Complexity of Data Reviewed Labs: ordered. Radiology: ordered.  Risk Prescription drug management.       Final diagnoses:  COPD exacerbation [facility])    ED Discharge Orders          Ordered    predniSONE  (DELTASONE ) 50 MG tablet  Daily        02/07/24 1556               Neysa Clap PARAS, [place] 02/07/24 1604

## 2024-02-07 NOTE — ED Notes (Signed)
 Pt given meal and drink, tolerating PO intake

## 2024-02-07 NOTE — ED Notes (Signed)
 Pt ambulated with pulse ox, pt O2 sat stayed 96%-100% with ambulation, pt ambulated about 100 feet with no assistance.

## 2024-02-07 NOTE — Discharge Instructions (Signed)
 Please take your inhaler every 4 hours for the next 24 hours scheduled then every 4 hours as needed thereafter.  We are prescribing you steroids.  Return for fevers, chills, chest pain, worsening shortness of breath or difficulty breathing, or if you develop any new or worsening symptoms that are concerning to you.

## 2024-02-07 NOTE — ED Triage Notes (Signed)
 Patient arrives POV for shortness of breath x 2 days. Patient with hx of COPD. Patient endorses he is out of his inhaler. Denies any pain or sick contacts. Patient endorses no additional oxygen  requirement and ineffective nebulizer treatment.

## 2024-02-07 NOTE — ED Notes (Signed)
 Pt moved from wc to chair in hallway, pt oxygen  maintains at 99% with movement.

## 2024-02-07 NOTE — ED Provider Triage Note (Signed)
 Emergency Medicine Provider Triage Evaluation Note  Jason Wong , a 68 y.o. male  was evaluated in triage.  Pt complains of shortness of breath x 2 days.  Reports history of COPD.  Out of his inhalers.  No URI symptoms.  No chest pain.  Review of Systems  Positive: Short of breath Negative: Chest pain  Physical Exam  BP (!) 128/93 (BP Location: Right Arm)   Pulse 96   Temp 98.2 F (36.8 C) (Oral)   Resp (!) 21   Ht 5' 6 (1.676 m)   Wt 56.7 kg   SpO2 97%   BMI 20.18 kg/m  Gen:   Awake, no distress   Resp:  Normal effort, slight tachypnea.  Speaking in full sentences.  Not in respiratory distress.  Does have diffuse expiratory wheezing. MSK:   Moves extremities without difficulty  Other:  No lower extremity edema  Medical Decision Making  Medically screening exam initiated at 9:57 AM.  Appropriate orders placed.  Jason Wong was informed that the remainder of the evaluation will be completed by another provider, this initial triage assessment does not replace that evaluation, and the importance of remaining in the ED until their evaluation is complete.  67 year old history of COPD and he denies home oxygen  use.  97% on room air, not in distress, but does have diffuse wheezing and some minor tachypnea.  Will get screening labs chest x-ray give steroids and breathing treatment.   Neysa Caron PARAS, DO 02/07/24 [phone]

## 2024-02-13 ENCOUNTER — Emergency Department (HOSPITAL_COMMUNITY)

## 2024-02-13 ENCOUNTER — Observation Stay (HOSPITAL_COMMUNITY)
Admission: EM | Admit: 2024-02-13 | Discharge: 2024-02-16 | Disposition: A | Discharge status: Home / Self Care | Attending: Family Medicine | Admitting: Family Medicine

## 2024-02-13 ENCOUNTER — Other Ambulatory Visit: Payer: Self-pay

## 2024-02-13 DIAGNOSIS — R0602 Shortness of breath: Secondary | ICD-10-CM | POA: Diagnosis present

## 2024-02-13 DIAGNOSIS — J441 Chronic obstructive pulmonary disease with (acute) exacerbation: Secondary | ICD-10-CM | POA: Diagnosis not present

## 2024-02-13 DIAGNOSIS — M79605 Pain in left leg: Secondary | ICD-10-CM | POA: Insufficient documentation

## 2024-02-13 DIAGNOSIS — F17200 Nicotine dependence, unspecified, uncomplicated: Secondary | ICD-10-CM | POA: Diagnosis not present

## 2024-02-13 DIAGNOSIS — F17220 Nicotine dependence, chewing tobacco, uncomplicated: Secondary | ICD-10-CM | POA: Diagnosis not present

## 2024-02-13 DIAGNOSIS — Z79899 Other long term (current) drug therapy: Secondary | ICD-10-CM | POA: Insufficient documentation

## 2024-02-13 DIAGNOSIS — R040 Epistaxis: Secondary | ICD-10-CM | POA: Insufficient documentation

## 2024-02-13 DIAGNOSIS — E7800 Pure hypercholesterolemia, unspecified: Secondary | ICD-10-CM | POA: Diagnosis not present

## 2024-02-13 DIAGNOSIS — F1910 Other psychoactive substance abuse, uncomplicated: Secondary | ICD-10-CM

## 2024-02-13 DIAGNOSIS — N1339 Other hydronephrosis: Secondary | ICD-10-CM | POA: Diagnosis not present

## 2024-02-13 DIAGNOSIS — I10 Essential (primary) hypertension: Secondary | ICD-10-CM | POA: Diagnosis not present

## 2024-02-13 DIAGNOSIS — F1990 Other psychoactive substance use, unspecified, uncomplicated: Secondary | ICD-10-CM | POA: Insufficient documentation

## 2024-02-13 DIAGNOSIS — J432 Centrilobular emphysema: Secondary | ICD-10-CM

## 2024-02-13 LAB — I-STAT VENOUS BLOOD GAS, ED
Acid-Base Excess: 2 mmol/L (ref 0.0–2.0)
Bicarbonate: 29.6 mmol/L — ABNORMAL HIGH (ref 20.0–28.0)
Calcium, Ion: 1.24 mmol/L (ref 1.15–1.40)
HCT: 45 % (ref 39.0–52.0)
Hemoglobin: 15.3 g/dL (ref 13.0–17.0)
O2 Saturation: 50 %
Potassium: 4.1 mmol/L (ref 3.5–5.1)
Sodium: 144 mmol/L (ref 135–145)
TCO2: 31 mmol/L (ref 22–32)
pCO2, Ven: 54.1 mmHg (ref 44–60)
pH, Ven: 7.346 (ref 7.25–7.43)
pO2, Ven: 29 mmHg — CL (ref 32–45)

## 2024-02-13 LAB — BASIC METABOLIC PANEL WITH GFR
Anion gap: 13 (ref 5–15)
BUN: 10 mg/dL (ref 8–23)
CO2: 21 mmol/L — ABNORMAL LOW (ref 22–32)
Calcium: 9.6 mg/dL (ref 8.9–10.3)
Chloride: 107 mmol/L (ref 98–111)
Creatinine, Ser: 0.92 mg/dL (ref 0.61–1.24)
GFR, Estimated: >60 mL/min (ref >60)
Glucose, Bld: 81 mg/dL (ref 70–99)
Potassium: 4.4 mmol/L (ref 3.5–5.1)
Sodium: 141 mmol/L (ref 135–145)

## 2024-02-13 LAB — URINALYSIS, ROUTINE W REFLEX MICROSCOPIC
Bilirubin Urine: NEGATIVE
Glucose, UA: NEGATIVE mg/dL
Hgb urine dipstick: NEGATIVE
Ketones, ur: NEGATIVE mg/dL
Leukocytes,Ua: NEGATIVE
Nitrite: NEGATIVE
Protein, ur: NEGATIVE mg/dL
Specific Gravity, Urine: 1.012 (ref 1.005–1.030)
pH: 6 (ref 5.0–8.0)

## 2024-02-13 LAB — CBC
HCT: 49.3 % (ref 39.0–52.0)
Hemoglobin: 15.8 g/dL (ref 13.0–17.0)
MCH: 32.7 pg (ref 26.0–34.0)
MCHC: 32 g/dL (ref 30.0–36.0)
MCV: 102.1 fL — ABNORMAL HIGH (ref 80.0–100.0)
Platelets: 275 K/uL (ref 150–400)
RBC: 4.83 MIL/uL (ref 4.22–5.81)
RDW: 13.3 % (ref 11.5–15.5)
WBC: 6 K/uL (ref 4.0–10.5)
nRBC: 0 % (ref 0.0–0.2)

## 2024-02-13 LAB — RESP PANEL BY RT-PCR (RSV, FLU A&B, COVID)  RVPGX2
Influenza A by PCR: NEGATIVE
Influenza B by PCR: NEGATIVE
Resp Syncytial Virus by PCR: NEGATIVE
SARS Coronavirus 2 by RT PCR: NEGATIVE

## 2024-02-13 LAB — ETHANOL: Alcohol, Ethyl (B): <15 mg/dL (ref <15)

## 2024-02-13 LAB — RAPID URINE DRUG SCREEN, HOSP PERFORMED
Amphetamines: NOT DETECTED
Barbiturates: NOT DETECTED
Benzodiazepines: NOT DETECTED
Cocaine: POSITIVE — AB
Opiates: NOT DETECTED
Tetrahydrocannabinol: NOT DETECTED

## 2024-02-13 LAB — MAGNESIUM: Magnesium: 2.2 mg/dL (ref 1.7–2.4)

## 2024-02-13 LAB — BRAIN NATRIURETIC PEPTIDE: B Natriuretic Peptide: 46.4 pg/mL (ref 0.0–100.0)

## 2024-02-13 MED ORDER — ACETAMINOPHEN 325 MG PO TABS: 650.0000 mg | ORAL_TABLET | Freq: Four times a day (QID) | ORAL | Status: DC | PRN

## 2024-02-13 MED ORDER — SENNOSIDES-DOCUSATE SODIUM 8.6-50 MG PO TABS: 1.0000 | ORAL_TABLET | Freq: Every evening | ORAL | Status: DC | PRN

## 2024-02-13 MED ORDER — ATORVASTATIN CALCIUM 10 MG PO TABS
10.0000 mg | ORAL_TABLET | Freq: Every day | ORAL | Status: DC
  Administered 2024-02-13 – 2024-02-16 (×4): 10 mg via ORAL
  Filled 2024-02-13 (×4): qty 1

## 2024-02-13 MED ORDER — ENOXAPARIN SODIUM 40 MG/0.4ML IJ SOSY
40.0000 mg | PREFILLED_SYRINGE | INTRAMUSCULAR | Status: DC
  Administered 2024-02-13 – 2024-02-15 (×3): 40 mg via SUBCUTANEOUS
  Filled 2024-02-13 (×3): qty 0.4

## 2024-02-13 MED ORDER — IPRATROPIUM-ALBUTEROL 0.5-2.5 (3) MG/3ML IN SOLN
3.0000 mL | Freq: Four times a day (QID) | RESPIRATORY_TRACT | Status: DC | PRN
  Administered 2024-02-13 – 2024-02-15 (×5): 3 mL via RESPIRATORY_TRACT
  Filled 2024-02-13 (×4): qty 3

## 2024-02-13 MED ORDER — IPRATROPIUM-ALBUTEROL 0.5-2.5 (3) MG/3ML IN SOLN
3.0000 mL | Freq: Once | RESPIRATORY_TRACT | Status: AC
  Administered 2024-02-13: 3 mL via RESPIRATORY_TRACT
  Filled 2024-02-13: qty 3

## 2024-02-13 MED ORDER — PREDNISONE 20 MG PO TABS
40.0000 mg | ORAL_TABLET | Freq: Every day | ORAL | Status: DC
  Administered 2024-02-14 – 2024-02-16 (×3): 40 mg via ORAL
  Filled 2024-02-13 (×3): qty 2

## 2024-02-13 MED ORDER — BISACODYL 5 MG PO TBEC: 5.0000 mg | DELAYED_RELEASE_TABLET | Freq: Every day | ORAL | Status: DC | PRN

## 2024-02-13 MED ORDER — AMLODIPINE BESYLATE 5 MG PO TABS
5.0000 mg | ORAL_TABLET | Freq: Every day | ORAL | Status: DC
  Administered 2024-02-13 – 2024-02-16 (×4): 5 mg via ORAL
  Filled 2024-02-13 (×4): qty 1

## 2024-02-13 MED ORDER — ONDANSETRON HCL 4 MG/2ML IJ SOLN: 4.0000 mg | Freq: Four times a day (QID) | INTRAMUSCULAR | Status: DC | PRN

## 2024-02-13 MED ORDER — AZITHROMYCIN 250 MG PO TABS
500.0000 mg | ORAL_TABLET | Freq: Every day | ORAL | Status: AC
Start: 2024-02-13 — End: 2024-02-16
  Administered 2024-02-13 – 2024-02-16 (×4): 500 mg via ORAL
  Filled 2024-02-13 (×4): qty 2

## 2024-02-13 MED ORDER — METHYLPREDNISOLONE SODIUM SUCC 125 MG IJ SOLR
125.0000 mg | Freq: Once | INTRAMUSCULAR | Status: AC
  Administered 2024-02-13: 125 mg via INTRAVENOUS
  Filled 2024-02-13: qty 2

## 2024-02-13 MED ORDER — ONDANSETRON HCL 4 MG PO TABS: 4.0000 mg | ORAL_TABLET | Freq: Four times a day (QID) | ORAL | Status: DC | PRN

## 2024-02-13 MED ORDER — BUDESON-GLYCOPYRROL-FORMOTEROL 160-9-4.8 MCG/ACT IN AERO
2.0000 | INHALATION_SPRAY | Freq: Two times a day (BID) | RESPIRATORY_TRACT | Status: DC
  Administered 2024-02-14 – 2024-02-16 (×5): 2 via RESPIRATORY_TRACT
  Filled 2024-02-13 (×2): qty 5.9

## 2024-02-13 MED ORDER — ALBUTEROL SULFATE (2.5 MG/3ML) 0.083% IN NEBU
5.0000 mg | INHALATION_SOLUTION | Freq: Once | RESPIRATORY_TRACT | Status: AC
  Administered 2024-02-13: 5 mg via RESPIRATORY_TRACT
  Filled 2024-02-13: qty 6

## 2024-02-13 MED ORDER — ACETAMINOPHEN 650 MG RE SUPP: 650.0000 mg | Freq: Four times a day (QID) | RECTAL | Status: DC | PRN

## 2024-02-13 NOTE — ED Triage Notes (Signed)
 Pt. Stated, Im SOB , my left leg hurts and Ive had some nose bleeds. I was here and they discharged me and I need to be in the hospital.

## 2024-02-13 NOTE — H&P (Signed)
 History and Physical  Jason Wong:[id number] DOB: 1955-06-10 DOA: 02/13/2024  PCP: Vicci Barnie NOVAK, MD   Chief Complaint: Shortness of breath, leg pain  HPI: Jason Wong is a 68 y.o. male with medical history significant for  COPD (active smoker), hypertension, hyperlipidemia, hepatitis C and polysubstance abuse (crack cocaine) who presented to the ED for evaluation of shortness of breath and leg pain. Patient reports that over the last 2 to 3 days, he has had persistent shortness of breath with associated dry cough and wheezing.  Also reporting left leg pain after falling few days ago. He denies any chest pain, abdominal pain, nausea, vomiting, fever, chills or dysuria.  Reports he is down to 1 cigarette a day and hoping to quit completely. He last smoked crack cocaine yesterday but states this is likely his last as he understands this has been making him feel short of breath.  Patient reports he lost his previous inhaler and has not been taking any medications because his PCP took him off his BP and cholesterol meds.  ED Course: Initial vitals show temp 99.7, RR 18-22, HR 70-80, SBP 120-150s, SpO2 99% on room air. Initial labs with positive cocaine on UDS otherwise unremarkable with normal BMP, CBC, mag, BNP, ethanol levels and negative flu, RSV and COVID test, UA with no signs of infection. EKG shows sinus rhythm with possible biatrial enlargement.. CXR shows no active disease.  X-ray of the left femur with no acute abnormality. Pt received IV Solu-Medrol  125 mg x 1, DuoNeb and albuterol  neb. [facility] was consulted for admission.   Review of Systems: Please see HPI for pertinent positives and negatives. A complete 10 system review of systems are otherwise negative.  Past Medical History:  Diagnosis Date   Asthma    COPD (chronic obstructive pulmonary disease) (HCC)    Hepatitis C antibody positive in blood    HLD (hyperlipidemia)    HTN (hypertension)    On home oxygen  therapy    per pt  08-31-2021 uses 02 about every other day   Past Surgical History:  Procedure Laterality Date   INCISE AND DRAIN ABCESS     dog bite   Social History:  reports that he has been smoking cigarettes. He has never used smokeless tobacco. He reports current alcohol use of about 2.0 standard drinks of alcohol per week. He reports that he does not currently use drugs after having used the following drugs: Crack cocaine.  Allergies  Allergen Reactions   Tobacco Shortness Of Breath and Other (See Comments)    CIGARETTE SMOKE TRIGGERS THE PATIENT'S RESPIRATORY ISSUES!!    Family History  Problem Relation Age of Onset   Hypertension Mother    Breast cancer Sister    Hypertension Sister    Colon cancer Brother    Colon polyps Neg Hx    Esophageal cancer Neg Hx    Rectal cancer Neg Hx    Stomach cancer Neg Hx      Prior to Admission medications   Medication Sig Start Date End Date Taking? Authorizing Provider  albuterol  (VENTOLIN  HFA) 108 (90 Base) MCG/ACT inhaler Inhale 2 puffs into the lungs every 4 (four) hours as needed for wheezing or shortness of breath. 01/22/24   Arlon Carliss ORN, DO  amLODipine  (NORVASC ) 5 MG tablet Take 1 tablet (5 mg total) by mouth daily. 01/09/24   Rosario Leatrice FERNS, MD  atorvastatin  (LIPITOR) 10 MG tablet Take 1 tablet (10 mg total) by mouth daily. 01/08/24 02/07/24  Ogbata,  Leatrice FERNS, MD  budesonide -glycopyrrolate -formoterol  (BREZTRI  AEROSPHERE) 160-9-4.8 MCG/ACT AERO inhaler Inhale 2 puffs into the lungs 2 (two) times daily. Patient taking differently: Inhale 2 puffs into the lungs 2 (two) times daily as needed (Asthma). 01/08/24   Rosario Leatrice FERNS, MD  folic acid  (FOLVITE ) 1 MG tablet Take 1 tablet (1 mg total) by mouth daily. 01/08/24   Rosario Leatrice FERNS, MD  ipratropium-albuterol  (DUONEB) 0.5-2.5 (3) MG/3ML SOLN Take 3 mLs by nebulization every 6 (six) hours as needed (shortness of breath , wheezing). 01/08/24   Rosario Leatrice FERNS, MD  nicotine   polacrilex (NICORETTE ) 2 MG gum Take 1 each (2 mg total) by mouth as needed for smoking cessation. Patient not taking: Reported on 01/20/2024 01/08/24   Rosario Leatrice I, MD  predniSONE  (DELTASONE ) 50 MG tablet Take 1 tablet (50 mg total) by mouth daily. 02/07/24   Neysa Caron PARAS, DO    Physical Exam: BP 129/88   Pulse 80   Temp 98.1 F (36.7 C) (Oral)   Resp 18   SpO2 96%  General: Pleasant, well-appearing elderly man laying in bed. No acute distress. HEENT: /AT. Anicteric sclera CV: RRR. No murmurs, rubs, or gallops. No LE edema Pulmonary: Lungs CTAB. Normal effort. No wheezing or rales. Abdominal: Soft, nontender, nondistended. Normal bowel sounds. Extremities: Palpable radial and DP pulses. Normal ROM. Skin: Warm and dry. No obvious rash or lesions. Neuro: A&Ox3. Moves all extremities. Normal sensation to light touch. No focal deficit. Psych: Normal mood and affect          Labs on Admission:  Basic Metabolic Panel: Recent Labs  Lab 02/07/24 1016 02/13/24 1034 02/13/24 1541 02/13/24 1557  NA 143 141  --  144  K 4.2 4.4  --  4.1  CL 106 107  --   --   CO2 21* 21*  --   --   GLUCOSE 86 81  --   --   BUN 11 10  --   --   CREATININE 1.10 0.92  --   --   CALCIUM  9.7 9.6  --   --   MG  --   --  2.2  --    Liver Function Tests: No results for input(s): AST, ALT, ALKPHOS, BILITOT, PROT, ALBUMIN in the last 168 hours. No results for input(s): LIPASE, AMYLASE in the last 168 hours. No results for input(s): AMMONIA in the last 168 hours. CBC: Recent Labs  Lab 02/07/24 1016 02/13/24 1034 02/13/24 1557  WBC 5.7 6.0  --   HGB 14.1 15.8 15.3  HCT 42.4 49.3 45.0  MCV 100.5* 102.1*  --   PLT 217 275  --    Cardiac Enzymes: No results for input(s): CKTOTAL, CKMB, CKMBINDEX, TROPONINI in the last 168 hours. BNP (last 3 results) Recent Labs    12/12/23 2243 01/05/24 1841 02/13/24 1541  BNP 170.0* 60.4 46.4    ProBNP (last 3  results) No results for input(s): PROBNP in the last 8760 hours.  CBG: No results for input(s): GLUCAP in the last 168 hours.  Radiological Exams on Admission: DG Chest 2 View Result Date: 02/13/2024 CLINICAL DATA:  Shortness of breath.  COPD. EXAM: DG CHEST 2V COMPARISON:  02/07/2024 FINDINGS: The heart size and mediastinal contours are within normal limits. Pulmonary hyperinflation again seen, consistent with COPD. Both lungs are clear. Old left posterior 5th rib fracture deformity again noted. IMPRESSION: COPD. No active cardiopulmonary disease. Electronically Signed   By: Norleen DELENA Kil M.D.   On: 02/13/2024  11:39   DG Femur Min 2 Views Left Result Date: 02/13/2024 EXAM: 2 VIEW(S) XRAY OF THE LEFT FEMUR 02/13/2024 11:09:00 AM COMPARISON: None available. CLINICAL HISTORY: shob FINDINGS: BONES AND JOINTS: No acute findings of the left femur. No fracture. No joint dislocation. SOFT TISSUES: Mild atherosclerotic calcification noted. IMPRESSION: 1. No acute osseous abnormality of the left femur. Electronically signed by: Norleen Boxer MD 02/13/2024 11:38 AM EST RP Workstation: [id number]   Assessment/Plan Dakwon Wenberg is a 68 y.o. male with medical history significant for COPD (active smoker), hypertension, hyperlipidemia, hepatitis C and polysubstance abuse (crack cocaine) who presented to the ED for evaluation of shortness of breath and leg pain and admitted for COPD exacerbation.  # COPD exacerbation - Patient with medication noncompliance presented with 2 to 3 days of persistent SOB, wheezing and cough - Respiratory status improved after receiving treatment in the ED, lungs are clear - CXR with no evidence of PNA; Neg covid, RSV and flu test - S/p IV solumedrol, multiple Nebs in the ED - Start prednisone  40 mg daily - Start daily azithromycin  500 mg x 4 doses - Resume home bronchodilator and as needed duonebs - Supplemental O2 as needed  # HTN - BP elevated with SBP in the 120s to  150s - Resume home amlodipine   # HLD - Lipid panel 1 month ago showed LDL 120 - Resume atorvastatin   # Polysubstance abuse - Smokes crack cocaine, last use 1 day prior to admission - UDS on admission positive for cocaine - Patient counseled on complete cessation as this is contributing to his recurrent COPD exacerbations  # Tobacco use disorder - Reports he is now down to 1 cigarette a day and has not smoked today - Commended him on cutting down and encouraged to quit completely   DVT prophylaxis: Lovenox      Code Status: Full Code  Consults called: None  Family Communication: No family at bedside  Severity of Illness: The appropriate patient status for this patient is OBSERVATION. Observation status is judged to be reasonable and necessary in order to provide the required intensity of service to ensure the patient's safety. The patient's presenting symptoms, physical exam findings, and initial radiographic and laboratory data in the context of their medical condition is felt to place them at decreased risk for further clinical deterioration. Furthermore, it is anticipated that the patient will be medically stable for discharge from the hospital within 2 midnights of admission.   Level of care: Telemetry    Lou Claretta HERO, MD 02/13/2024, 8:27 PM [facility] Pager: [phone] Isaiah 41:10   If 7PM-7AM, please contact night-coverage [email or web address] Password [email or web address]

## 2024-02-13 NOTE — ED Provider Triage Note (Signed)
 Emergency Medicine Provider Triage Evaluation Note  Jason Wong , a 68 y.o. male  was evaluated in triage.  Pt complains of shortness of breath.  Started yesterday.  Lost his inhaler.  Also complaining of pain to his left thigh after a fall 4 to 5 days ago.  Asking for food  Review of Systems  Positive: Short of breath Negative: Chest pain  Physical Exam  BP (!) 152/103   Pulse 83   Resp (!) 22   SpO2 99%  Gen:   Awake, no distress   Resp:  Normal effort, coarse breath sounds, poor air movement MSK:   Moves extremities without difficulty, some tenderness to his left thigh.  No lower extremity edema Other:  Alert and oriented x 3  Medical Decision Making  Medically screening exam initiated at 10:36 AM.  Appropriate orders placed.  Jason Wong was informed that the remainder of the evaluation will be completed by another provider, this initial triage assessment does not replace that evaluation, and the importance of remaining in the ED until their evaluation is complete.  68 year old COPD, polysubstance abuse hypertension presenting emergency department for shortness of breath.  Does not appear to be in severe respiratory distress.  Maintaining oxygen  saturation on room air.  Will give breathing treatment, get basic labs, EKG.  Will also get x-ray of femur given his reported fall.   Jason Caron PARAS, DO 02/13/24 1036

## 2024-02-13 NOTE — ED Notes (Signed)
 Called floor and advised patient will be in route to floor after 40 minute mark, floor receptive.

## 2024-02-13 NOTE — ED Provider Notes (Signed)
 [facility] EMERGENCY DEPARTMENT AT [facility] Provider Note   CSN: [id number] Arrival date & time: 02/13/24  9052     Patient presents with: Shortness of Breath, Leg Pain, and Epistaxis   Jason Wong is a 68 y.o. male.    Shortness of Breath Leg Pain Associated symptoms: fatigue   Epistaxis Patient presents for shortness of breath.  Medical history includes COPD, prediabetes, polysubstance abuse, HLD, HTN.  He has frequent ED visits and hospitalizations.  Prior to today, he was last seen in the ED 6 days ago for shortness of breath.  He was treated with breathing treatments and steroids.  He was given prescription for ongoing steroids.  He states that he never did pick those up.  He also states that he lost the inhaler that he was given.  For the past 2 days, he has had worsening shortness of breath.  He denies any associated chest pain.  He did have a recent fall and has had left knee pain.       Prior to Admission medications   Medication Sig Start Date End Date Taking? Authorizing Provider  albuterol  (VENTOLIN  HFA) 108 (90 Base) MCG/ACT inhaler Inhale 2 puffs into the lungs every 4 (four) hours as needed for wheezing or shortness of breath. Patient not taking: Reported on 02/13/2024 01/22/24   Arlon Carliss ORN, DO  amLODipine  (NORVASC ) 5 MG tablet Take 1 tablet (5 mg total) by mouth daily. Patient not taking: Reported on 02/13/2024 01/09/24   Rosario Eland I, MD  atorvastatin  (LIPITOR) 10 MG tablet Take 1 tablet (10 mg total) by mouth daily. Patient not taking: Reported on 02/13/2024 01/08/24 02/07/24  Rosario Eland I, MD  budesonide -glycopyrrolate -formoterol  (BREZTRI  AEROSPHERE) 160-9-4.8 MCG/ACT AERO inhaler Inhale 2 puffs into the lungs 2 (two) times daily. Patient not taking: Reported on 02/13/2024 01/08/24   Rosario Eland I, MD  folic acid  (FOLVITE ) 1 MG tablet Take 1 tablet (1 mg total) by mouth daily. Patient not taking: Reported on 02/13/2024  01/08/24   Rosario Eland I, MD  ipratropium-albuterol  (DUONEB) 0.5-2.5 (3) MG/3ML SOLN Take 3 mLs by nebulization every 6 (six) hours as needed (shortness of breath , wheezing). Patient not taking: Reported on 02/13/2024 01/08/24   Rosario Eland I, MD  predniSONE  (DELTASONE ) 50 MG tablet Take 1 tablet (50 mg total) by mouth daily. Patient not taking: Reported on 02/13/2024 02/07/24   Neysa Caron PARAS, DO    Allergies: Tobacco    Review of Systems  Constitutional:  Positive for fatigue.  Respiratory:  Positive for chest tightness and shortness of breath.   Musculoskeletal:  Positive for arthralgias.  All other systems reviewed and are negative.   Updated Vital Signs BP (!) 148/98 (BP Location: Right Arm)   Pulse 90   Temp (!) 97.5 F (36.4 C) (Oral)   Resp (!) 22   Wt 52.9 kg   SpO2 99%   BMI 18.82 kg/m   Physical Exam Vitals and nursing note reviewed.  Constitutional:      General: He is not in acute distress.    Appearance: He is well-developed. He is not ill-appearing, toxic-appearing or diaphoretic.  HENT:     Head: Normocephalic and atraumatic.     Mouth/Throat:     Mouth: Mucous membranes are moist.  Eyes:     Conjunctiva/sclera: Conjunctivae normal.  Cardiovascular:     Rate and Rhythm: Normal rate and regular rhythm.  Pulmonary:     Effort: Pulmonary effort is normal. Tachypnea present. No  respiratory distress.     Breath sounds: Decreased breath sounds present.  Chest:     Chest wall: No tenderness.  Abdominal:     Palpations: Abdomen is soft.     Tenderness: There is no abdominal tenderness.  Musculoskeletal:        General: No swelling. Normal range of motion.     Cervical back: Normal range of motion and neck supple.  Skin:    General: Skin is warm and dry.     Coloration: Skin is not cyanotic or pale.  Neurological:     General: No focal deficit present.     Mental Status: He is alert and oriented to person, place, and time.  Psychiatric:         Mood and Affect: Mood normal.        Behavior: Behavior normal.     (all labs ordered are listed, but only abnormal results are displayed) Labs Reviewed  CBC - Abnormal; Notable for the following components:      Result Value   MCV 102.1 (*)    All other components within normal limits  BASIC METABOLIC PANEL WITH GFR - Abnormal; Notable for the following components:   CO2 21 (*)    All other components within normal limits  RAPID URINE DRUG SCREEN, HOSP PERFORMED - Abnormal; Notable for the following components:   Cocaine POSITIVE (*)    All other components within normal limits  I-STAT VENOUS BLOOD GAS, ED - Abnormal; Notable for the following components:   pO2, Ven 29 (*)    Bicarbonate 29.6 (*)    All other components within normal limits  RESP PANEL BY RT-PCR (RSV, FLU A&B, COVID)  RVPGX2  URINALYSIS, ROUTINE W REFLEX MICROSCOPIC  MAGNESIUM   BRAIN NATRIURETIC PEPTIDE  ETHANOL  BASIC METABOLIC PANEL WITH GFR  CBC    EKG: EKG Interpretation Date/Time:  Tuesday February 13 2024 09:55:45 EST Ventricular Rate:  83 PR Interval:  132 QRS Duration:  78 QT Interval:  366 QTC Calculation: 430 R Axis:   84  Text Interpretation: Normal sinus rhythm Biatrial enlargement Confirmed by Melvenia Motto [phone]) on 02/13/2024 3:11:00 PM  Radiology: DG Chest 2 View Result Date: 02/13/2024 CLINICAL DATA:  Shortness of breath.  COPD. EXAM: DG CHEST 2V COMPARISON:  02/07/2024 FINDINGS: The heart size and mediastinal contours are within normal limits. Pulmonary hyperinflation again seen, consistent with COPD. Both lungs are clear. Old left posterior 5th rib fracture deformity again noted. IMPRESSION: COPD. No active cardiopulmonary disease. Electronically Signed   By: Norleen DELENA Kil M.D.   On: 02/13/2024 11:39   DG Femur Min 2 Views Left Result Date: 02/13/2024 EXAM: 2 VIEW(S) XRAY OF THE LEFT FEMUR 02/13/2024 11:09:00 AM COMPARISON: None available. CLINICAL HISTORY: shob FINDINGS: BONES  AND JOINTS: No acute findings of the left femur. No fracture. No joint dislocation. SOFT TISSUES: Mild atherosclerotic calcification noted. IMPRESSION: 1. No acute osseous abnormality of the left femur. Electronically signed by: Norleen Boxer MD 02/13/2024 11:38 AM EST RP Workstation: [id number]     Procedures   Medications Ordered in the ED  acetaminophen  (TYLENOL ) tablet 650 mg (has no administration in time range)    Or  acetaminophen  (TYLENOL ) suppository 650 mg (has no administration in time range)  senna-docusate (Senokot-S) tablet 1 tablet (has no administration in time range)  bisacodyl  (DULCOLAX) EC tablet 5 mg (has no administration in time range)  ondansetron  (ZOFRAN ) tablet 4 mg (has no administration in time range)    Or  ondansetron  (ZOFRAN ) injection 4 mg (has no administration in time range)  enoxaparin  (LOVENOX ) injection 40 mg (has no administration in time range)  amLODipine  (NORVASC ) tablet 5 mg (has no administration in time range)  atorvastatin  (LIPITOR) tablet 10 mg (has no administration in time range)  budesonide -glycopyrrolate -formoterol  (BREZTRI ) 160-9-4.8 MCG/ACT inhaler 2 puff (has no administration in time range)  ipratropium-albuterol  (DUONEB) 0.5-2.5 (3) MG/3ML nebulizer solution 3 mL (has no administration in time range)  azithromycin  (ZITHROMAX ) tablet 500 mg (has no administration in time range)  predniSONE  (DELTASONE ) tablet 40 mg (has no administration in time range)  ipratropium-albuterol  (DUONEB) 0.5-2.5 (3) MG/3ML nebulizer solution 3 mL (3 mLs Nebulization Given 02/13/24 1126)  methylPREDNISolone  sodium succinate (SOLU-MEDROL ) 125 mg/2 mL injection 125 mg (125 mg Intravenous Given 02/13/24 1614)  albuterol  (PROVENTIL ) (2.5 MG/3ML) 0.083% nebulizer solution 5 mg (5 mg Nebulization Given 02/13/24 1611)                                    Medical Decision Making Amount and/or Complexity of Data Reviewed Labs: ordered.  Risk Prescription drug  management. Decision regarding hospitalization.   This patient presents to the ED for concern of shortness of breath, this involves an extensive number of treatment options, and is a complaint that carries with it a high risk of complications and morbidity.  The differential diagnosis includes COPD exacerbation, pneumonia, CHF, illicit drug use, anemia, acidosis   Co morbidities / Chronic conditions that complicate the patient evaluation  COPD, prediabetes, polysubstance abuse, HLD, HTN   Additional history obtained:  Additional history obtained from EMR External records from outside source obtained and reviewed including N/A   Lab Tests:  I Ordered, and personally interpreted labs.  The pertinent results include: Normal hemoglobin, no leukocytosis, normal kidney function, normal electrolytes, normal BNP.  Blood gas shows compensated hypercarbia.  UDS positive for cocaine.   Imaging Studies ordered:  I ordered imaging studies including x-ray of chest and left femur I independently visualized and interpreted imaging which showed no acute findings I agree with the radiologist interpretation   Cardiac Monitoring: / EKG:  The patient was maintained on a cardiac monitor.  I personally viewed and interpreted the cardiac monitored which showed an underlying rhythm of: Sinus rhythm   Problem List / ED Course / Critical interventions / Medication management  Patient presents for shortness of breath.  He has a history of COPD and states that he has had worsening severity of chest tightness and shortness of breath over the past 2 days.  Feels like today that he cannot breathe.  On arrival in the ED, vital signs are notable for tachypnea and hypertension.  On exam, he does have some mildly increased work of breathing.  He is able to speak in complete sentences.  He has diminished breath sounds on lung auscultation.  Prior to being bedded in the ED, he did receive a DuoNeb breathing  treatment.  He states that this gave him minimal relief.  Additional breathing treatment and steroids were ordered.  Workup was initiated.  Patient also reports a recent fall and describes some distal femur and leg pain.  X-ray imaging does not show any acute findings.  There is no significant swelling, deformity, or tenderness on exam.  After breathing treatment, patient had improvement in his increased work of breathing.  He was given food and drink.  On reassessment, patient reports ongoing shortness of breath.  He is unwilling to participate in trial of ambulation and stating that he cannot breathe.  Additional breathing treatment ordered.  Patient admitted for COPD exacerbation. I ordered medication including DuoNeb, albuterol , Solu-Medrol  for COPD exacerbation Reevaluation of the patient after these medicines showed that the patient improved I have reviewed the patients home medicines and have made adjustments as needed  Social Determinants of Health:  Frequent ED visits and hospitalizations     Final diagnoses:  COPD exacerbation [facility])    ED Discharge Orders     None          Melvenia Motto, MD 02/13/24 2104

## 2024-02-14 ENCOUNTER — Observation Stay (HOSPITAL_COMMUNITY)

## 2024-02-14 ENCOUNTER — Encounter (HOSPITAL_COMMUNITY): Payer: Self-pay | Admitting: Student

## 2024-02-14 DIAGNOSIS — J441 Chronic obstructive pulmonary disease with (acute) exacerbation: Principal | ICD-10-CM

## 2024-02-14 LAB — CBC
HCT: 39.7 % (ref 39.0–52.0)
Hemoglobin: 13.5 g/dL (ref 13.0–17.0)
MCH: 33.2 pg (ref 26.0–34.0)
MCHC: 34 g/dL (ref 30.0–36.0)
MCV: 97.5 fL (ref 80.0–100.0)
Platelets: 289 K/uL (ref 150–400)
RBC: 4.07 MIL/uL — ABNORMAL LOW (ref 4.22–5.81)
RDW: 13.1 % (ref 11.5–15.5)
WBC: 3.3 K/uL — ABNORMAL LOW (ref 4.0–10.5)
nRBC: 0 % (ref 0.0–0.2)

## 2024-02-14 LAB — BASIC METABOLIC PANEL WITH GFR
Anion gap: 13 (ref 5–15)
BUN: 15 mg/dL (ref 8–23)
CO2: 23 mmol/L (ref 22–32)
Calcium: 9.8 mg/dL (ref 8.9–10.3)
Chloride: 102 mmol/L (ref 98–111)
Creatinine, Ser: 0.96 mg/dL (ref 0.61–1.24)
GFR, Estimated: >60 mL/min (ref >60)
Glucose, Bld: 193 mg/dL — ABNORMAL HIGH (ref 70–99)
Potassium: 4.1 mmol/L (ref 3.5–5.1)
Sodium: 138 mmol/L (ref 135–145)

## 2024-02-14 NOTE — Care Management Obs Status (Signed)
 MEDICARE OBSERVATION STATUS NOTIFICATION   Patient Details  Name: Jason Wong MRN: [id number] Date of Birth: 1955/10/19   Medicare Observation Status Notification Given:  Yes    Jacobo, [place] 02/14/2024, 10:22 AM

## 2024-02-14 NOTE — Progress Notes (Addendum)
 PROGRESS NOTE    Jason Wong  FMW:[id number] DOB: [birth year] DOA: 02/13/2024 PCP: Vicci Barnie NOVAK, MD  Chief Complaint  Patient presents with   Shortness of Breath   Leg Pain   Epistaxis    Brief Narrative:   Jason Wong is Jason Wong 68 y.o. male with medical history significant for  COPD (active smoker), hypertension, hyperlipidemia, hepatitis C and polysubstance abuse (crack cocaine) who presented to the ED for evaluation of shortness of breath and leg pain. Patient reports that over the last 2 to 3 days, he has had persistent shortness of breath with associated dry cough and wheezing.  Also reporting left leg pain after falling few days ago. He denies any chest pain, abdominal pain, nausea, vomiting, fever, chills or dysuria.  Reports he is down to 1 cigarette Evin Loiseau day and hoping to quit completely. He last smoked crack cocaine yesterday but states this is likely his last as he understands this has been making him feel short of breath.  Patient reports he lost his previous inhaler and has not been taking any medications because his PCP took him off his BP and cholesterol meds.   ED Course: Initial vitals show temp 99.7, RR 18-22, HR 70-80, SBP 120-150s, SpO2 99% on room air. Initial labs with positive cocaine on UDS otherwise unremarkable with normal BMP, CBC, mag, BNP, ethanol levels and negative flu, RSV and COVID test, UA with no signs of infection. EKG shows sinus rhythm with possible biatrial enlargement.. CXR shows no active disease.  X-ray of the left femur with no acute abnormality. Pt received IV Solu-Medrol  125 mg x 1, DuoNeb and albuterol  neb. TRH was consulted for admission.   Assessment & Plan:   Principal Problem:   COPD with acute exacerbation (HCC) Active Problems:   Tobacco use disorder   Substance use disorder  # COPD exacerbation - Patient with medication noncompliance presented with 2 to 3 days of persistent SOB, wheezing and cough - Respiratory status improved, but  still notes SOB worse than baseline - CXR with no evidence of PNA; Neg covid, RSV and flu test - continue steroids, scheduled and prn nebs, azithro  - hopefully d/c 11/27   # Hydro Follow repeat renal US   # HTN - amlodipine    # HLD - Lipid panel 1 month ago showed LDL 120 - Resume atorvastatin    # Polysubstance abuse - Smokes crack cocaine, last use 1 day prior to admission - UDS on admission positive for cocaine - encourage    # Tobacco use disorder - Reports he is now down to 1 cigarette Markiah Janeway day and has not smoked today - Commended him on cutting down and encouraged to quit completely    DVT prophylaxis: lovenox  Code Status: full Family Communication: none Disposition:   Status is: Observation The patient remains OBS appropriate and will d/c before 2 midnights.   Consultants:  none  Procedures:  none  Antimicrobials:  Anti-infectives (From admission, onward)    Start     Dose/Rate Route Frequency Ordered Stop   02/13/24 2045  azithromycin  (ZITHROMAX ) tablet 500 mg        500 mg Oral Daily 02/13/24 2036 02/17/24 0959       Subjective: No new complaints SOB is persistent   Objective: Vitals:   02/14/24 0339 02/14/24 0924 02/14/24 1416 02/14/24 1659  BP: (!) 157/98 132/81 (!) 119/102 130/86  Pulse: 72 80 78 72  Resp: 17 14 18 18   Temp: 98.1 F (36.7 C) 97.9 F (36.6  C) 98.4 F (36.9 C) 98.1 F (36.7 C)  TempSrc: Oral Oral Oral Oral  SpO2: 100% 96% 95% 96%  Weight:      Height:        Intake/Output Summary (Last 24 hours) at 02/14/2024 1751 Last data filed at 02/14/2024 1659 Gross per 24 hour  Intake 100 ml  Output 500 ml  Net -400 ml   Filed Weights   02/13/24 2052 02/13/24 2100  Weight: 52.9 kg 52.9 kg    Examination:  General exam: Appears calm and comfortable  Respiratory system: diminished Cardiovascular system: RRR Gastrointestinal system: Abdomen is nondistended, soft and nontender Central nervous system: Alert and oriented.  No focal neurological deficits. 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   Data Reviewed: I have personally reviewed following labs and imaging studies  CBC: Recent Labs  Lab 02/13/24 1034 02/13/24 1557 02/14/24 0359  WBC 6.0  --  3.3*  HGB 15.8 15.3 13.5  HCT 49.3 45.0 39.7  MCV 102.1*  --  97.5  PLT 275  --  289    Basic Metabolic Panel: Recent Labs  Lab 02/13/24 1034 02/13/24 1541 02/13/24 1557 02/14/24 0359  NA 141  --  144 138  K 4.4  --  4.1 4.1  CL 107  --   --  102  CO2 21*  --   --  23  GLUCOSE 81  --   --  193*  BUN 10  --   --  15  CREATININE 0.92  --   --  0.96  CALCIUM  9.6  --   --  9.8  MG  --  2.2  --   --     GFR: Estimated Creatinine Clearance: 55.1 mL/min (by C-G formula based on SCr of 0.96 mg/dL).  Liver Function Tests: No results for input(s): AST, ALT, ALKPHOS, BILITOT, PROT, ALBUMIN in the last 168 hours.  CBG: No results for input(s): GLUCAP in the last 168 hours.   Recent Results (from the past 240 hours)  Resp panel by RT-PCR (RSV, Flu Daltin Crist&B, Covid) Anterior Nasal Swab     Status: None   Collection Time: 02/13/24  3:28 PM   Specimen: Anterior Nasal Swab  Result Value Ref Range Status   SARS Coronavirus 2 by RT PCR NEGATIVE NEGATIVE Final   Influenza Savas Elvin by PCR NEGATIVE NEGATIVE Final   Influenza B by PCR NEGATIVE NEGATIVE Final    Comment: (NOTE) The Xpert Xpress SARS-CoV-2/FLU/RSV plus assay is intended as an aid in the diagnosis of influenza from Nasopharyngeal swab specimens and should not be used as Ragena Fiola sole basis for treatment. Nasal washings and aspirates are unacceptable for Xpert Xpress SARS-CoV-2/FLU/RSV testing.  Fact Sheet for Patients: bloggercourse.com  Fact Sheet for Healthcare Providers: seriousbroker.it  This test is not yet approved or cleared by the [place]  FDA and has been authorized for detection and/or diagnosis of  SARS-CoV-2 by FDA under an Emergency Use Authorization (EUA). This EUA will remain in effect (meaning this test can be used) for the duration of the COVID-19 declaration under Section 564(b)(1) of the Act, 21 U.S.C. section 360bbb-3(b)(1), unless the authorization is terminated or revoked.     Resp Syncytial Virus by PCR NEGATIVE NEGATIVE Final    Comment: (NOTE) Fact Sheet for Patients: bloggercourse.com  Fact Sheet for Healthcare Providers: seriousbroker.it  This test is not yet approved or cleared by the [place]  FDA and has been authorized for detection and/or diagnosis of  SARS-CoV-2 by FDA under an Emergency Use Authorization (EUA). This EUA will remain in effect (meaning this test can be used) for the duration of the COVID-19 declaration under Section 564(b)(1) of the Act, 21 U.S.C. section 360bbb-3(b)(1), unless the authorization is terminated or revoked.  Performed at [facility], [address], [place], [place] [address]          Radiology Studies: DG Chest 2 View Result Date: 02/13/2024 CLINICAL DATA:  Shortness of breath.  COPD. EXAM: DG CHEST 2V COMPARISON:  02/07/2024 FINDINGS: The heart size and mediastinal contours are within normal limits. Pulmonary hyperinflation again seen, consistent with COPD. Both lungs are clear. Old left posterior 5th rib fracture deformity again noted. IMPRESSION: COPD. No active cardiopulmonary disease. Electronically Signed   By: Norleen DELENA Kil M.D.   On: 02/13/2024 11:39   DG Femur Min 2 Views Left Result Date: 02/13/2024 EXAM: 2 VIEW(S) XRAY OF THE LEFT FEMUR 02/13/2024 11:09:00 AM COMPARISON: None available. CLINICAL HISTORY: shob FINDINGS: BONES AND JOINTS: No acute findings of the left femur. No fracture. No joint dislocation. SOFT TISSUES: Mild atherosclerotic calcification noted. IMPRESSION: 1. No acute osseous abnormality of the left femur. Electronically signed by:  Norleen Boxer MD 02/13/2024 11:38 AM EST RP Workstation: [id number]        Scheduled Meds:  amLODipine   5 mg Oral Daily   atorvastatin   10 mg Oral Daily   azithromycin   500 mg Oral Daily   budesonide -glycopyrrolate -formoterol   2 puff Inhalation BID   enoxaparin  (LOVENOX ) injection  40 mg Subcutaneous Q24H   predniSONE   40 mg Oral Q breakfast   Continuous Infusions:   LOS: 0 days    Time spent: over 30 min     Meliton Monte, MD [facility]   To contact the attending provider between 7A-7P or the covering provider during after hours 7P-7A, please log into the web site [email or web address] and access using universal [place] password for that web site. If you do not have the password, please call the hospital operator.  02/14/2024, 5:51 PM

## 2024-02-14 NOTE — Plan of Care (Signed)

## 2024-02-15 DIAGNOSIS — J441 Chronic obstructive pulmonary disease with (acute) exacerbation: Secondary | ICD-10-CM | POA: Diagnosis not present

## 2024-02-15 NOTE — Progress Notes (Signed)
 PROGRESS NOTE    Jason Wong  FMW:[id number] DOB: April 05, 1955 DOA: 02/13/2024 PCP: Vicci Barnie NOVAK, MD  Chief Complaint  Patient presents with   Shortness of Breath   Leg Pain   Epistaxis    Brief Narrative:   Jason Wong is Jason Wong 68 y.o. male with medical history significant for  COPD (active smoker), hypertension, hyperlipidemia, hepatitis C and polysubstance abuse (crack cocaine) who presented to the ED for evaluation of shortness of breath and leg pain. Patient reports that over the last 2 to 3 days, he has had persistent shortness of breath with associated dry cough and wheezing.  Also reporting left leg pain after falling few days ago. He denies any chest pain, abdominal pain, nausea, vomiting, fever, chills or dysuria.  Reports he is down to 1 cigarette Dhyana Bastone day and hoping to quit completely. He last smoked crack cocaine yesterday but states this is likely his last as he understands this has been making him feel short of breath.  Patient reports he lost his previous inhaler and has not been taking any medications because his PCP took him off his BP and cholesterol meds.   ED Course: Initial vitals show temp 99.7, RR 18-22, HR 70-80, SBP 120-150s, SpO2 99% on room air. Initial labs with positive cocaine on UDS otherwise unremarkable with normal BMP, CBC, mag, BNP, ethanol levels and negative flu, RSV and COVID test, UA with no signs of infection. EKG shows sinus rhythm with possible biatrial enlargement.. CXR shows no active disease.  X-ray of the left femur with no acute abnormality. Pt received IV Solu-Medrol  125 mg x 1, DuoNeb and albuterol  neb. TRH was consulted for admission.   Assessment & Plan:   Principal Problem:   COPD with acute exacerbation (HCC) Active Problems:   Tobacco use disorder   Substance use disorder  # COPD exacerbation - Patient with medication noncompliance presented with 2 to 3 days of persistent SOB, wheezing and cough - Respiratory status improved, but  still notes SOB worse than baseline - CXR with no evidence of PNA; Neg covid, RSV and flu test - continue steroids, scheduled and prn nebs, azithro  - hopefully d/c 11/28 - pharmacy availability    # Hydro Renal US  with mild bilateral hydro, chronic bladder outlet obstruction Will discuss with urology  # HTN - amlodipine    # HLD - Lipid panel 1 month ago showed LDL 120 - Resume atorvastatin    # Polysubstance abuse - Smokes crack cocaine, last use 1 day prior to admission - UDS on admission positive for cocaine - encourage    # Tobacco use disorder - Reports he is now down to 1 cigarette Glendoris Nodarse day and has not smoked today - Commended him on cutting down and encouraged to quit completely    DVT prophylaxis: lovenox  Code Status: full Family Communication: none Disposition:   Status is: Observation The patient remains OBS appropriate and will d/c before 2 midnights.   Consultants:  none  Procedures:  none  Antimicrobials:  Anti-infectives (From admission, onward)    Start     Dose/Rate Route Frequency Ordered Stop   02/13/24 2045  azithromycin  (ZITHROMAX ) tablet 500 mg        500 mg Oral Daily 02/13/24 2036 02/17/24 0959       Subjective: No new complaints  Objective: Vitals:   02/14/24 2329 02/15/24 0343 02/15/24 0801 02/15/24 1237  BP: 121/83 (!) 140/90  123/83  Pulse: 63 61 79   Resp: 16 18 19  18  Temp: 98.1 F (36.7 C) 97.7 F (36.5 C)  98.3 F (36.8 C)  TempSrc: Oral Oral  Oral  SpO2: 98% 99% 98%   Weight:      Height:        Intake/Output Summary (Last 24 hours) at 02/15/2024 1504 Last data filed at 02/15/2024 1300 Gross per 24 hour  Intake 920 ml  Output 375 ml  Net 545 ml   Filed Weights   02/13/24 2052 02/13/24 2100  Weight: 52.9 kg 52.9 kg    Examination:  General: No acute distress. Cardiovascular: RRR Lungs: Clear to auscultation bilaterally Neurological: Alert and oriented 3. Moves all extremities 4 with equal strength.  Cranial nerves II through XII grossly intact. 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   Data Reviewed: I have personally reviewed following labs and imaging studies  CBC: Recent Labs  Lab 02/13/24 1034 02/13/24 1557 02/14/24 0359  WBC 6.0  --  3.3*  HGB 15.8 15.3 13.5  HCT 49.3 45.0 39.7  MCV 102.1*  --  97.5  PLT 275  --  289    Basic Metabolic Panel: Recent Labs  Lab 02/13/24 1034 02/13/24 1541 02/13/24 1557 02/14/24 0359  NA 141  --  144 138  K 4.4  --  4.1 4.1  CL 107  --   --  102  CO2 21*  --   --  23  GLUCOSE 81  --   --  193*  BUN 10  --   --  15  CREATININE 0.92  --   --  0.96  CALCIUM  9.6  --   --  9.8  MG  --  2.2  --   --     GFR: Estimated Creatinine Clearance: 55.1 mL/min (by C-G formula based on SCr of 0.96 mg/dL).  Liver Function Tests: No results for input(s): AST, ALT, ALKPHOS, BILITOT, PROT, ALBUMIN in the last 168 hours.  CBG: No results for input(s): GLUCAP in the last 168 hours.   Recent Results (from the past 240 hours)  Resp panel by RT-PCR (RSV, Flu Phoenix Dresser&B, Covid) Anterior Nasal Swab     Status: None   Collection Time: 02/13/24  3:28 PM   Specimen: Anterior Nasal Swab  Result Value Ref Range Status   SARS Coronavirus 2 by RT PCR NEGATIVE NEGATIVE Final   Influenza Jason Wong by PCR NEGATIVE NEGATIVE Final   Influenza B by PCR NEGATIVE NEGATIVE Final    Comment: (NOTE) The Xpert Xpress SARS-CoV-2/FLU/RSV plus assay is intended as an aid in the diagnosis of influenza from Nasopharyngeal swab specimens and should not be used as Jason Wong sole basis for treatment. Nasal washings and aspirates are unacceptable for Xpert Xpress SARS-CoV-2/FLU/RSV testing.  Fact Sheet for Patients: bloggercourse.com  Fact Sheet for Healthcare Providers: seriousbroker.it  This test is not yet approved or cleared by the [place]  FDA and has been authorized for detection and/or diagnosis  of SARS-CoV-2 by FDA under an Emergency Use Authorization (EUA). This EUA will remain in effect (meaning this test can be used) for the duration of the COVID-19 declaration under Section 564(b)(1) of the Act, 21 U.S.C. section 360bbb-3(b)(1), unless the authorization is terminated or revoked.     Resp Syncytial Virus by PCR NEGATIVE NEGATIVE Final    Comment: (NOTE) Fact Sheet for Patients: bloggercourse.com  Fact Sheet for Healthcare Providers: seriousbroker.it  This test is not yet approved or cleared by the [place]  FDA and has been authorized for detection and/or diagnosis of SARS-CoV-2  by FDA under an Emergency Use Authorization (EUA). This EUA will remain in effect (meaning this test can be used) for the duration of the COVID-19 declaration under Section 564(b)(1) of the Act, 21 U.S.C. section 360bbb-3(b)(1), unless the authorization is terminated or revoked.  Performed at [facility], [address], [place], [place] [address]          Radiology Studies: US  RENAL Result Date: 02/14/2024 EXAM: US  Retroperitoneum Complete, Renal. 02/14/2024 03:16:57 PM TECHNIQUE: Real-time ultrasonography of the retroperitoneum renal was performed. COMPARISON: None available CLINICAL HISTORY: Hydronephrosis. FINDINGS: RIGHT KIDNEY/URETER: Right kidney measures 8.6 x 6.4 x 5.2 cm. Mild right hydronephrosis. Normal cortical echogenicity. No calculus. No mass. LEFT KIDNEY/URETER: Left kidney measures 9.2 x 3.7 x 3.9 cm. Mild left hydronephrosis. Normal cortical echogenicity. No calculus. No mass. BLADDER: Mild bladder wall irregularity may be related to chronic bladder outlet obstruction. Question bilateral ureteroceles. PROSTATE: Prostate enlargement. IMPRESSION: 1. Mild bilateral hydronephrosis. 2. Prostate enlargement and mild bladder wall irregularity, possibly related to chronic bladder outlet obstruction. 3. Possible bilateral  ureteroceles. Electronically signed by: Franky Crease MD 02/14/2024 09:03 PM EST RP Workstation: [id number]        Scheduled Meds:  amLODipine   5 mg Oral Daily   atorvastatin   10 mg Oral Daily   azithromycin   500 mg Oral Daily   budesonide -glycopyrrolate -formoterol   2 puff Inhalation BID   enoxaparin  (LOVENOX ) injection  40 mg Subcutaneous Q24H   predniSONE   40 mg Oral Q breakfast   Continuous Infusions:   LOS: 0 days    Time spent: over 30 min     Meliton Monte, MD [facility]   To contact the attending provider between 7A-7P or the covering provider during after hours 7P-7A, please log into the web site [email or web address] and access using universal [place] password for that web site. If you do not have the password, please call the hospital operator.  02/15/2024, 3:04 PM

## 2024-02-15 NOTE — Plan of Care (Signed)

## 2024-02-15 NOTE — Plan of Care (Signed)
  Problem: Activity: Goal: Ability to tolerate increased activity will improve Outcome: Progressing   Problem: Respiratory: Goal: Ability to maintain a clear airway will improve Outcome: Progressing Goal: Levels of oxygenation will improve Outcome: Progressing   

## 2024-02-16 ENCOUNTER — Other Ambulatory Visit: Payer: Self-pay

## 2024-02-16 ENCOUNTER — Other Ambulatory Visit (HOSPITAL_COMMUNITY): Payer: Self-pay

## 2024-02-16 DIAGNOSIS — J441 Chronic obstructive pulmonary disease with (acute) exacerbation: Secondary | ICD-10-CM | POA: Diagnosis not present

## 2024-02-16 LAB — HEMOGLOBIN A1C
Hgb A1c MFr Bld: 5.8 % — ABNORMAL HIGH (ref 4.8–5.6)
Mean Plasma Glucose: 120 mg/dL

## 2024-02-16 MED ORDER — FOLIC ACID 1 MG PO TABS
1.0000 mg | ORAL_TABLET | Freq: Every day | ORAL | 0 refills | Status: AC
  Filled 2024-02-16: qty 90, 90d supply, fill #0

## 2024-02-16 MED ORDER — ATORVASTATIN CALCIUM 10 MG PO TABS
10.0000 mg | ORAL_TABLET | Freq: Every day | ORAL | 0 refills | Status: AC
  Filled 2024-02-16: qty 90, 90d supply, fill #0

## 2024-02-16 MED ORDER — PREDNISONE 20 MG PO TABS
40.0000 mg | ORAL_TABLET | Freq: Every day | ORAL | 0 refills | Status: AC
  Filled 2024-02-16: qty 4, 2d supply, fill #0

## 2024-02-16 MED ORDER — ALBUTEROL SULFATE HFA 108 (90 BASE) MCG/ACT IN AERS
2.0000 | INHALATION_SPRAY | RESPIRATORY_TRACT | 0 refills | Status: DC | PRN
  Filled 2024-02-16: qty 6.7, 16d supply, fill #0

## 2024-02-16 MED ORDER — AMLODIPINE BESYLATE 5 MG PO TABS
5.0000 mg | ORAL_TABLET | Freq: Every day | ORAL | 0 refills | Status: AC
  Filled 2024-02-16: qty 90, 90d supply, fill #0

## 2024-02-16 MED ORDER — CHLORHEXIDINE GLUCONATE CLOTH 2 % EX PADS
6.0000 | MEDICATED_PAD | Freq: Every day | CUTANEOUS | Status: DC
  Administered 2024-02-16: 6 via TOPICAL

## 2024-02-16 MED ORDER — IPRATROPIUM-ALBUTEROL 0.5-2.5 (3) MG/3ML IN SOLN
3.0000 mL | Freq: Four times a day (QID) | RESPIRATORY_TRACT | 0 refills | Status: AC | PRN
  Filled 2024-02-16: qty 90, 8d supply, fill #0

## 2024-02-16 MED ORDER — TAMSULOSIN HCL 0.4 MG PO CAPS
0.4000 mg | ORAL_CAPSULE | Freq: Every day | ORAL | 0 refills | Status: DC
  Filled 2024-02-16: qty 30, 30d supply, fill #0

## 2024-02-16 MED ORDER — TAMSULOSIN HCL 0.4 MG PO CAPS: 0.4000 mg | ORAL_CAPSULE | Freq: Every day | ORAL | Status: DC

## 2024-02-16 MED ORDER — BREZTRI AEROSPHERE 160-9-4.8 MCG/ACT IN AERO
2.0000 | INHALATION_SPRAY | Freq: Two times a day (BID) | RESPIRATORY_TRACT | 0 refills | Status: DC
  Filled 2024-02-16: qty 10.7, 30d supply, fill #0

## 2024-02-16 NOTE — Plan of Care (Signed)

## 2024-02-16 NOTE — Plan of Care (Signed)
 Problem: Education: Goal: Knowledge of General Education information will improve Description: Including pain rating scale, medication(s)/side effects and non-pharmacologic comfort measures 02/16/2024 1427 by Dallie Waddell SAUNDERS, RN Outcome: Adequate for Discharge 02/16/2024 0958 by Dallie Waddell SAUNDERS, RN Outcome: Adequate for Discharge   Problem: Health Behavior/Discharge Planning: Goal: Ability to manage health-related needs will improve 02/16/2024 1427 by Dallie Waddell SAUNDERS, RN Outcome: Adequate for Discharge 02/16/2024 9041 by Dallie Waddell SAUNDERS, RN Outcome: Adequate for Discharge   Problem: Clinical Measurements: Goal: Ability to maintain clinical measurements within normal limits will improve 02/16/2024 1427 by Dallie Waddell SAUNDERS, RN Outcome: Adequate for Discharge 02/16/2024 9041 by Dallie Waddell SAUNDERS, RN Outcome: Adequate for Discharge Goal: Will remain free from infection 02/16/2024 1427 by Dallie Waddell SAUNDERS, RN Outcome: Adequate for Discharge 02/16/2024 0958 by Dallie Waddell SAUNDERS, RN Outcome: Adequate for Discharge Goal: Diagnostic test results will improve 02/16/2024 1427 by Dallie Waddell SAUNDERS, RN Outcome: Adequate for Discharge 02/16/2024 9041 by Dallie Waddell SAUNDERS, RN Outcome: Adequate for Discharge Goal: Respiratory complications will improve 02/16/2024 1427 by Dallie Waddell SAUNDERS, RN Outcome: Adequate for Discharge 02/16/2024 9041 by Dallie Waddell SAUNDERS, RN Outcome: Adequate for Discharge Goal: Cardiovascular complication will be avoided 02/16/2024 1427 by Dallie Waddell SAUNDERS, RN Outcome: Adequate for Discharge 02/16/2024 9041 by Dallie Waddell SAUNDERS, RN Outcome: Adequate for Discharge   Problem: Activity: Goal: Risk for activity intolerance will decrease 02/16/2024 1427 by Dallie Waddell SAUNDERS, RN Outcome: Adequate for Discharge 02/16/2024 9041 by Dallie Waddell SAUNDERS, RN Outcome: Adequate for Discharge   Problem: Nutrition: Goal: Adequate nutrition will be maintained 02/16/2024 1427 by Dallie Waddell SAUNDERS,  RN Outcome: Adequate for Discharge 02/16/2024 0958 by Dallie Waddell SAUNDERS, RN Outcome: Adequate for Discharge   Problem: Coping: Goal: Level of anxiety will decrease 02/16/2024 1427 by Dallie Waddell SAUNDERS, RN Outcome: Adequate for Discharge 02/16/2024 9041 by Dallie Waddell SAUNDERS, RN Outcome: Adequate for Discharge   Problem: Elimination: Goal: Will not experience complications related to bowel motility 02/16/2024 1427 by Dallie Waddell SAUNDERS, RN Outcome: Adequate for Discharge 02/16/2024 0958 by Dallie Waddell SAUNDERS, RN Outcome: Adequate for Discharge Goal: Will not experience complications related to urinary retention 02/16/2024 1427 by Dallie Waddell SAUNDERS, RN Outcome: Adequate for Discharge 02/16/2024 0958 by Dallie Waddell SAUNDERS, RN Outcome: Adequate for Discharge   Problem: Pain Managment: Goal: General experience of comfort will improve and/or be controlled 02/16/2024 1427 by Dallie Waddell SAUNDERS, RN Outcome: Adequate for Discharge 02/16/2024 9041 by Dallie Waddell SAUNDERS, RN Outcome: Adequate for Discharge   Problem: Safety: Goal: Ability to remain free from injury will improve 02/16/2024 1427 by Dallie Waddell SAUNDERS, RN Outcome: Adequate for Discharge 02/16/2024 9041 by Dallie Waddell SAUNDERS, RN Outcome: Adequate for Discharge   Problem: Skin Integrity: Goal: Risk for impaired skin integrity will decrease 02/16/2024 1427 by Dallie Waddell SAUNDERS, RN Outcome: Adequate for Discharge 02/16/2024 9041 by Dallie Waddell SAUNDERS, RN Outcome: Adequate for Discharge   Problem: Education: Goal: Knowledge of disease or condition will improve 02/16/2024 1427 by Dallie Waddell SAUNDERS, RN Outcome: Adequate for Discharge 02/16/2024 9041 by Dallie Waddell SAUNDERS, RN Outcome: Adequate for Discharge Goal: Knowledge of the prescribed therapeutic regimen will improve 02/16/2024 1427 by Dallie Waddell SAUNDERS, RN Outcome: Adequate for Discharge 02/16/2024 9041 by Dallie Waddell SAUNDERS, RN Outcome: Adequate for Discharge Goal: Individualized Educational  Video(s) 02/16/2024 1427 by Dallie Waddell SAUNDERS, RN Outcome: Adequate for Discharge 02/16/2024 0958 by Dallie Waddell SAUNDERS, RN Outcome: Adequate for Discharge   Problem: Activity: Goal: Ability to tolerate increased  activity will improve 02/16/2024 1427 by Dallie Waddell SAUNDERS, RN Outcome: Adequate for Discharge 02/16/2024 [phone] by Dallie Waddell SAUNDERS, RN Outcome: Adequate for Discharge Goal: Will verbalize the importance of balancing activity with adequate rest periods 02/16/2024 1427 by Dallie Waddell SAUNDERS, RN Outcome: Adequate for Discharge 02/16/2024 0958 by Dallie Waddell SAUNDERS, RN Outcome: Adequate for Discharge   Problem: Respiratory: Goal: Ability to maintain a clear airway will improve 02/16/2024 1427 by Dallie Waddell SAUNDERS, RN Outcome: Adequate for Discharge 02/16/2024 0958 by Dallie Waddell SAUNDERS, RN Outcome: Adequate for Discharge Goal: Levels of oxygenation will improve 02/16/2024 1427 by Dallie Waddell SAUNDERS, RN Outcome: Adequate for Discharge 02/16/2024 9041 by Dallie Waddell SAUNDERS, RN Outcome: Adequate for Discharge Goal: Ability to maintain adequate ventilation will improve 02/16/2024 1427 by Dallie Waddell SAUNDERS, RN Outcome: Adequate for Discharge 02/16/2024 0958 by Dallie Waddell SAUNDERS, RN Outcome: Adequate for Discharge

## 2024-02-16 NOTE — Discharge Summary (Addendum)
 Physician Discharge Summary  Gen Clagg FMW:[id number] DOB: 07/17/1955 DOA: 02/13/2024  PCP: Vicci Barnie NOVAK, MD  Admit date: 02/13/2024 Discharge date: 02/16/2024  Time spent: 40 minutes  Recommendations for Outpatient Follow-up:  Follow outpatient CBC/CMP  Follow with PCP outpatient, consider pulmonary referral outpatient for COPD Needs trial of void with urology outpatient  Needs urology follow up for bilateral hydro  Encourage smoking cessation Encourage cocaine cessation  Mild post foley hematuria - return precautions given   Discharge Diagnoses:  Principal Problem:   COPD with acute exacerbation (HCC) Active Problems:   Tobacco use disorder   Substance use disorder   Discharge Condition: stable  Diet recommendation: heart healthy   Filed Weights   02/13/24 2052 02/13/24 2100  Weight: 52.9 kg 52.9 kg    History of present illness:   Daxx Tiggs is Remi Lopata 68 y.o. male with medical history significant for  COPD (active smoker), hypertension, hyperlipidemia, hepatitis C and polysubstance abuse (crack cocaine) who presented to the ED for evaluation of shortness of breath and leg pain. Patient reports that over the last 2 to 3 days, he has had persistent shortness of breath with associated dry cough and wheezing.  Also reporting left leg pain after falling few days ago.   He's been admitted for Alaze Garverick COPD exacerbation.  Improved at this time.  Discharged with refills of home meds, plan for outpatient follow up.   Hospital Course:  Assessment and Plan:  # COPD exacerbation - Patient with medication noncompliance presented with 2 to 3 days of persistent SOB, wheezing and cough - Respiratory status improved today - CXR with no evidence of PNA; Neg covid, RSV and flu test - completed azithro.  Discharged on steroids.  Refilled breztri , albuterol , and prn duonebs  # Bilateral Hydronephrosis  Urinary Retention Renal US  with mild bilateral hydro, chronic bladder outlet  obstruction Will discuss with urology - plan for outpatient follow up - will start on flomax Bladder scan with 500 cc post void, foley placed and 700 cc out, needs outpatient trial of void, discharging with foley in place Needs outpatient urology follow up   # Epistaxis Mild, resolved at time of my eval  # HTN - amlodipine , refilled at discharge   # HLD - Lipid panel 1 month ago showed LDL 120 - refilled lipitor, follow with PCP outpatient   # Left Leg Pain Plain films femur unremarkable  His TTP is mild and localized to thigh No TTP of L knee or L hip, good ROM.  # Polysubstance abuse - Smokes crack cocaine, last use 1 day prior to admission - UDS on admission positive for cocaine - encourage cessation   # Tobacco use disorder - encourage continued smoking cessation     Procedures: none   Consultations: none  Discharge Exam: Vitals:   02/16/24 0820 02/16/24 0826  BP:  118/81  Pulse: 74 77  Resp: 14 16  Temp:  98.6 F (37 C)  SpO2: 94% 92%   No complaints Had Shatora Weatherbee mild nosebleed overnight  General: No acute distress. Cardiovascular: RRR Lungs: diminished, unlabored, no wheezing Neurological: Alert and oriented 3. Moves all extremities 4 with equal strength. Cranial nerves II through XII grossly intact. Extremities: No clubbing or cyanosis. No edema.   Discharge Instructions   Discharge Instructions     Call MD for:  difficulty breathing, headache or visual disturbances   Complete by: As directed    Call MD for:  extreme fatigue   Complete by: As directed  Call MD for:  hives   Complete by: As directed    Call MD for:  persistant dizziness or light-headedness   Complete by: As directed    Call MD for:  persistant nausea and vomiting   Complete by: As directed    Call MD for:  redness, tenderness, or signs of infection (pain, swelling, redness, odor or green/yellow discharge around incision site)   Complete by: As directed    Call MD for:   severe uncontrolled pain   Complete by: As directed    Call MD for:  temperature >100.4   Complete by: As directed    Diet - low sodium heart healthy   Complete by: As directed    Discharge instructions   Complete by: As directed    You were seen for Brailee Riede COPD exacerbation.  You've improved with steroids and breathing treatments.  I'll refill your breztri  (controller medicine) and your albuterol .  I'll also fill your duonebs that you can use with your nebulizer for shortness of breath as needed.    It's important that you avoid cocaine and other drugs as these played Miyana Mordecai role in your presentation.    You have chronic hydronephrosis.  You need to follow up with urology as an outpatient.  I started you on Jaydalynn Olivero medicine called flomax .   Return for new, recurrent, or worsening symptoms.  Please ask your PCP to request records from this hospitalization so they know what was done and what the next steps will be.   Increase activity slowly   Complete by: As directed       Allergies as of 02/16/2024       Reactions   Tobacco Shortness Of Breath, Other (See Comments)   CIGARETTE SMOKE TRIGGERS THE PATIENT'S RESPIRATORY ISSUES!!        Medication List     TAKE these medications    albuterol  108 (90 Base) MCG/ACT inhaler Commonly known as: VENTOLIN  HFA Inhale 2 puffs into the lungs every 4 (four) hours as needed for wheezing or shortness of breath. Follow with your outpatient doctor for refills What changed: additional instructions   amLODipine  5 MG tablet Commonly known as: NORVASC  Take 1 tablet (5 mg total) by mouth daily.   atorvastatin  10 MG tablet Commonly known as: LIPITOR Take 1 tablet (10 mg total) by mouth daily.   Breztri  Aerosphere 160-9-4.8 MCG/ACT Aero inhaler Generic drug: budesonide -glycopyrrolate -formoterol  Inhale 2 puffs into the lungs 2 (two) times daily. Follow with your outpatient doctor for refills. What changed: additional instructions   folic acid  1 MG  tablet Commonly known as: FOLVITE  Take 1 tablet (1 mg total) by mouth daily.   ipratropium-albuterol  0.5-2.5 (3) MG/3ML Soln Commonly known as: DUONEB Take 3 mLs by nebulization every 6 (six) hours as needed (shortness of breath , wheezing).   predniSONE  20 MG tablet Commonly known as: DELTASONE  Take 2 tablets (40 mg total) by mouth daily with breakfast for 2 days. Start taking on: February 17, 2024 What changed:  medication strength how much to take when to take this   tamsulosin  0.4 MG Caps capsule Commonly known as: FLOMAX  Take 1 capsule (0.4 mg total) by mouth daily after supper.       Allergies  Allergen Reactions   Tobacco Shortness Of Breath and Other (See Comments)    CIGARETTE SMOKE TRIGGERS THE PATIENT'S RESPIRATORY ISSUES!!    Follow-up Information     Vicci Barnie NOVAK, MD Follow up.   Specialty: Internal Medicine Why: Call for  Bianney Rockwood follow up appointment within 1 week Contact information: [address] [place] [address] [phone]         [facility] Follow up.   Why: Call for an appointment for the bilateral hydronephrosis (enlarged kidneys) seen on your imaging. Contact information: [address] [place]  [address] [phone]                 The results of significant diagnostics from this hospitalization (including imaging, microbiology, ancillary and laboratory) are listed below for reference.    Significant Diagnostic Studies: US  RENAL Result Date: 02/14/2024 EXAM: US  Retroperitoneum Complete, Renal. 02/14/2024 03:16:57 PM TECHNIQUE: Real-time ultrasonography of the retroperitoneum renal was performed. COMPARISON: None available CLINICAL HISTORY: Hydronephrosis. FINDINGS: RIGHT KIDNEY/URETER: Right kidney measures 8.6 x 6.4 x 5.2 cm. Mild right hydronephrosis. Normal cortical echogenicity. No calculus. No mass. LEFT KIDNEY/URETER: Left kidney measures 9.2 x 3.7 x 3.9 cm. Mild left  hydronephrosis. Normal cortical echogenicity. No calculus. No mass. BLADDER: Mild bladder wall irregularity may be related to chronic bladder outlet obstruction. Question bilateral ureteroceles. PROSTATE: Prostate enlargement. IMPRESSION: 1. Mild bilateral hydronephrosis. 2. Prostate enlargement and mild bladder wall irregularity, possibly related to chronic bladder outlet obstruction. 3. Possible bilateral ureteroceles. Electronically signed by: Franky Crease MD 02/14/2024 09:03 PM EST RP Workstation: [id number]   DG Chest 2 View Result Date: 02/13/2024 CLINICAL DATA:  Shortness of breath.  COPD. EXAM: DG CHEST 2V COMPARISON:  02/07/2024 FINDINGS: The heart size and mediastinal contours are within normal limits. Pulmonary hyperinflation again seen, consistent with COPD. Both lungs are clear. Old left posterior 5th rib fracture deformity again noted. IMPRESSION: COPD. No active cardiopulmonary disease. Electronically Signed   By: Norleen DELENA Kil M.D.   On: 02/13/2024 11:39   DG Femur Min 2 Views Left Result Date: 02/13/2024 EXAM: 2 VIEW(S) XRAY OF THE LEFT FEMUR 02/13/2024 11:09:00 AM COMPARISON: None available. CLINICAL HISTORY: shob FINDINGS: BONES AND JOINTS: No acute findings of the left femur. No fracture. No joint dislocation. SOFT TISSUES: Mild atherosclerotic calcification noted. IMPRESSION: 1. No acute osseous abnormality of the left femur. Electronically signed by: Norleen Boxer MD 02/13/2024 11:38 AM EST RP Workstation: [id number]   DG Chest 2 View Result Date: 02/07/2024 CLINICAL DATA:  Shortness of breath. EXAM: CHEST - 2 VIEW COMPARISON:  01/19/2024 FINDINGS: Lungs are adequately inflated and otherwise clear. Cardiomediastinal silhouette and remainder of the exam is unchanged. IMPRESSION: No active cardiopulmonary disease. Electronically Signed   By: Toribio Agreste M.D.   On: 02/07/2024 10:39   CT Angio Chest PE W and/or Wo Contrast Result Date: 01/20/2024 CLINICAL DATA:  Evaluate for pulmonary  embolism. EXAM: CT ANGIOGRAPHY CHEST WITH CONTRAST TECHNIQUE: Multidetector CT imaging of the chest was performed using the standard protocol during bolus administration of intravenous contrast. Multiplanar CT image reconstructions and MIPs were obtained to evaluate the vascular anatomy. RADIATION DOSE REDUCTION: This exam was performed according to the departmental dose-optimization program which includes automated exposure control, adjustment of the mA and/or kV according to patient size and/or use of iterative reconstruction technique. CONTRAST:  75mL OMNIPAQUE  IOHEXOL  350 MG/ML SOLN COMPARISON:  September 11, 2023 FINDINGS: Cardiovascular: There is mild calcification of the aortic arch. The segmental and subsegmental lower lobe branches of the right and left pulmonary arteries are limited in evaluation secondary to motion artifact. No evidence of pulmonary embolism. Normal heart size. No pericardial effusion. Mediastinum/Nodes: No enlarged mediastinal, hilar, or axillary lymph nodes. Thyroid  gland, trachea, and  esophagus demonstrate no significant findings. Lungs/Pleura: There is evidence of emphysematous lung disease involving predominantly the bilateral upper lobes. No acute infiltrate, pleural effusion or pneumothorax is identified. Upper Abdomen: There is marked severity bilateral hydronephrosis Musculoskeletal: No chest wall abnormality. No acute or significant osseous findings. Review of the MIP images confirms the above findings. IMPRESSION: 1. No evidence of pulmonary embolism or acute cardiopulmonary disease. 2. Marked severity bilateral hydronephrosis. Electronically Signed   By: Suzen Dials M.D.   On: 01/20/2024 01:52   CT Head Wo Contrast Result Date: 01/20/2024 CLINICAL DATA:  Initial evaluation for acute mental status change, unknown cause. EXAM: CT HEAD WITHOUT CONTRAST TECHNIQUE: Contiguous axial images were obtained from the base of the skull through the vertex without intravenous contrast.  RADIATION DOSE REDUCTION: This exam was performed according to the departmental dose-optimization program which includes automated exposure control, adjustment of the mA and/or kV according to patient size and/or use of iterative reconstruction technique. COMPARISON:  None Available. FINDINGS: Brain: Age-related cerebral atrophy. Patchy and confluent hypodensity involving the supratentorial cerebral white matter, consistent with chronic small vessel ischemic disease, moderate in nature. Few scatter remote lacunar infarcts present about the deep gray nuclei. No acute intracranial hemorrhage. No acute large vessel territory infarct. No mass lesion or midline shift. No hydrocephalus or extra-axial fluid collection. Vascular: No abnormal hyperdense vessel. Scattered vascular calcifications noted within the carotid siphons. Skull: Scalp soft tissues within normal limits.  Calvarium intact. Sinuses/Orbits: Globes and orbital soft tissues within normal limits. Mild chronic mucosal thickening present about the ethmoidal air cells. Paranasal sinuses are otherwise largely clear. No significant mastoid effusion. Other: None. IMPRESSION: 1. No acute intracranial abnormality. 2. Age-related cerebral atrophy with moderate chronic small vessel ischemic disease. Electronically Signed   By: Morene Hoard M.D.   On: 01/20/2024 01:46   DG Chest Portable 1 View Result Date: 01/19/2024 EXAM: 1 VIEW XRAY OF THE CHEST 01/19/2024 09:56:00 PM COMPARISON: None available. CLINICAL HISTORY: SOB SOB FINDINGS: LUNGS AND PLEURA: No focal pulmonary opacity. No pulmonary edema. No pleural effusion. No pneumothorax. HEART AND MEDIASTINUM: No acute abnormality of the cardiac and mediastinal silhouettes. BONES AND SOFT TISSUES: No acute osseous abnormality. IMPRESSION: 1. No acute process. Electronically signed by: Franky Crease MD 01/19/2024 09:59 PM EDT RP Workstation: [id number]    Microbiology: Recent Results (from the past 240 hours)   Resp panel by RT-PCR (RSV, Flu Astella Desir&B, Covid) Anterior Nasal Swab     Status: None   Collection Time: 02/13/24  3:28 PM   Specimen: Anterior Nasal Swab  Result Value Ref Range Status   SARS Coronavirus 2 by RT PCR NEGATIVE NEGATIVE Final   Influenza Martese Vanatta by PCR NEGATIVE NEGATIVE Final   Influenza B by PCR NEGATIVE NEGATIVE Final    Comment: (NOTE) The Xpert Xpress SARS-CoV-2/FLU/RSV plus assay is intended as an aid in the diagnosis of influenza from Nasopharyngeal swab specimens and should not be used as Emerlyn Mehlhoff sole basis for treatment. Nasal washings and aspirates are unacceptable for Xpert Xpress SARS-CoV-2/FLU/RSV testing.  Fact Sheet for Patients: bloggercourse.com  Fact Sheet for Healthcare Providers: seriousbroker.it  This test is not yet approved or cleared by the [place]  FDA and has been authorized for detection and/or diagnosis of SARS-CoV-2 by FDA under an Emergency Use Authorization (EUA). This EUA will remain in effect (meaning this test can be used) for the duration of the COVID-19 declaration under Section 564(b)(1) of the Act, 21 U.S.C. section 360bbb-3(b)(1), unless the authorization is terminated or revoked.  Resp Syncytial Virus by PCR NEGATIVE NEGATIVE Final    Comment: (NOTE) Fact Sheet for Patients: bloggercourse.com  Fact Sheet for Healthcare Providers: seriousbroker.it  This test is not yet approved or cleared by the [place]  FDA and has been authorized for detection and/or diagnosis of SARS-CoV-2 by FDA under an Emergency Use Authorization (EUA). This EUA will remain in effect (meaning this test can be used) for the duration of the COVID-19 declaration under Section 564(b)(1) of the Act, 21 U.S.C. section 360bbb-3(b)(1), unless the authorization is terminated or revoked.  Performed at [facility], [address] [place],  [place] [address]      Labs: Basic Metabolic Panel: Recent Labs  Lab 02/13/24 1034 02/13/24 1541 02/13/24 1557 02/14/24 0359  NA 141  --  144 138  K 4.4  --  4.1 4.1  CL 107  --   --  102  CO2 21*  --   --  23  GLUCOSE 81  --   --  193*  BUN 10  --   --  15  CREATININE 0.92  --   --  0.96  CALCIUM  9.6  --   --  9.8  MG  --  2.2  --   --    Liver Function Tests: No results for input(s): AST, ALT, ALKPHOS, BILITOT, PROT, ALBUMIN in the last 168 hours. No results for input(s): LIPASE, AMYLASE in the last 168 hours. No results for input(s): AMMONIA in the last 168 hours. CBC: Recent Labs  Lab 02/13/24 1034 02/13/24 1557 02/14/24 0359  WBC 6.0  --  3.3*  HGB 15.8 15.3 13.5  HCT 49.3 45.0 39.7  MCV 102.1*  --  97.5  PLT 275  --  289   Cardiac Enzymes: No results for input(s): CKTOTAL, CKMB, CKMBINDEX, TROPONINI in the last 168 hours. BNP: BNP (last 3 results) Recent Labs    12/12/23 2243 01/05/24 1841 02/13/24 1541  BNP 170.0* 60.4 46.4    ProBNP (last 3 results) No results for input(s): PROBNP in the last 8760 hours.  CBG: No results for input(s): GLUCAP in the last 168 hours.     Signed:  Meliton Monte MD.  [facility] 02/16/2024, 9:55 AM

## 2024-02-19 ENCOUNTER — Telehealth: Payer: Self-pay

## 2024-02-19 NOTE — Transitions of Care (Post Inpatient/ED Visit) (Signed)
   02/19/2024  Name: Jason Wong MRN: [id number] DOB: 20-Jul-1955  Today's TOC FU Call Status: Today's TOC FU Call Status:: Unsuccessful Call (1st Attempt) Unsuccessful Call (1st Attempt) Date: 02/19/24  Attempted to reach the patient regarding the most recent Inpatient/ED visit.  Follow Up Plan: Additional outreach attempts will be made to reach the patient to complete the Transitions of Care (Post Inpatient/ED visit) call.   I called [phone] and the phone just rings, no option to leave a message.   Signature  Slater Diesel, RN

## 2024-02-20 ENCOUNTER — Emergency Department (HOSPITAL_COMMUNITY)
Admission: EM | Admit: 2024-02-20 | Discharge: 2024-02-20 | Disposition: A | Discharge status: Home / Self Care | Attending: Emergency Medicine | Admitting: Emergency Medicine

## 2024-02-20 ENCOUNTER — Telehealth: Payer: Self-pay

## 2024-02-20 ENCOUNTER — Other Ambulatory Visit: Payer: Self-pay

## 2024-02-20 DIAGNOSIS — J449 Chronic obstructive pulmonary disease, unspecified: Secondary | ICD-10-CM | POA: Diagnosis not present

## 2024-02-20 DIAGNOSIS — Z79899 Other long term (current) drug therapy: Secondary | ICD-10-CM | POA: Diagnosis not present

## 2024-02-20 DIAGNOSIS — Z978 Presence of other specified devices: Secondary | ICD-10-CM

## 2024-02-20 DIAGNOSIS — Z466 Encounter for fitting and adjustment of urinary device: Secondary | ICD-10-CM | POA: Insufficient documentation

## 2024-02-20 DIAGNOSIS — F17200 Nicotine dependence, unspecified, uncomplicated: Secondary | ICD-10-CM | POA: Diagnosis not present

## 2024-02-20 DIAGNOSIS — I10 Essential (primary) hypertension: Secondary | ICD-10-CM | POA: Diagnosis not present

## 2024-02-20 NOTE — Transitions of Care (Post Inpatient/ED Visit) (Signed)
   02/20/2024  Name: Jason Wong MRN: [id number] DOB: 05-27-1955  Today's TOC FU Call Status: Today's TOC FU Call Status:: Unsuccessful Call (2nd Attempt) Unsuccessful Call (1st Attempt) Date: 02/19/24 Unsuccessful Call (2nd Attempt) Date: 02/20/24  The phone just rings, no option to leave a message   Attempted to reach the patient regarding the most recent Inpatient/ED visit.  Follow Up Plan: Additional outreach attempts will be made to reach the patient to complete the Transitions of Care (Post Inpatient/ED visit) call.   Signature  Slater Diesel, RN

## 2024-02-20 NOTE — ED Triage Notes (Signed)
 Pt, stated, Ive had this catheter for about 3 days and my penis hurts, I need for it to come out.

## 2024-02-20 NOTE — ED Provider Notes (Signed)
 [place] EMERGENCY DEPARTMENT AT [facility] Provider Note   CSN: [id number] Arrival date & time: 02/20/24  9153     Patient presents with: Penis Pain and catheter removed   Jason Wong is a 68 y.o. male.   68 year old male with prior medical history as detailed below presents for evaluation.  He reports that he had a catheter placed during his last admission.  He is requesting removal of the catheter.  He did not follow-up with urology in the outpatient setting prior to coming to the ED.  See excerpt below from recent discharge summary:  Admit date: 02/13/2024 Discharge date: 02/16/2024   Time spent: 40 minutes   Recommendations for Outpatient Follow-up:  1. Follow outpatient CBC/CMP  2. Follow with PCP outpatient, consider pulmonary referral outpatient for COPD 3. Needs trial of void with urology outpatient  4. Needs urology follow up for bilateral hydro  5. Encourage smoking cessation 6. Encourage cocaine cessation  7. Mild post foley hematuria - return precautions given    Discharge Diagnoses:  Principal Problem:   COPD with acute exacerbation (HCC) Active Problems:   Tobacco use disorder   Substance use disorder     Discharge Condition: stable   Diet recommendation: heart healthy    Filed Weights   02/13/24 2052 02/13/24 2100 Weight: 52.9 kg 52.9 kg     History of present illness:    Jason Wong is a 68 y.o. male with medical history significant for  COPD (active smoker), hypertension, hyperlipidemia, hepatitis C and polysubstance abuse (crack cocaine) who presented to the ED for evaluation of shortness of breath and leg pain. Patient reports that over the last 2 to 3 days, he has had persistent shortness of breath with associated dry cough and wheezing.  Also reporting left leg pain after falling few days ago.    He's been admitted for a COPD exacerbation.   Improved at this time.  Discharged with refills of home meds, plan for outpatient  follow up.    Hospital Course:  Assessment and Plan:   # COPD exacerbation - Patient with medication noncompliance presented with 2 to 3 days of persistent SOB, wheezing and cough - Respiratory status improved today - CXR with no evidence of PNA; Neg covid, RSV and flu test - completed azithro.  Discharged on steroids.  Refilled breztri , albuterol , and prn duonebs   # Bilateral Hydronephrosis  Urinary Retention Renal US  with mild bilateral hydro, chronic bladder outlet obstruction Will discuss with urology - plan for outpatient follow up - will start on flomax Bladder scan with 500 cc post void, foley placed and 700 cc out, needs outpatient trial of void, discharging with foley in place Needs outpatient urology follow up   # Epistaxis Mild, resolved at time of my eval   # HTN - amlodipine , refilled at discharge   # HLD - Lipid panel 1 month ago showed LDL 120 - refilled lipitor, follow with PCP outpatient   # Left Leg Pain Plain films femur unremarkable  His TTP is mild and localized to thigh No TTP of L knee or L hip, good ROM.   # Polysubstance abuse - Smokes crack cocaine, last use 1 day prior to admission - UDS on admission positive for cocaine - encourage cessation   # Tobacco use disorder - encourage continued smoking cessation     The history is provided by the patient and medical records.       Prior to Admission medications   Medication  Sig Start Date End Date Taking? Authorizing Provider  albuterol  (VENTOLIN  HFA) 108 (90 Base) MCG/ACT inhaler Inhale 2 puffs into the lungs every 4 (four) hours as needed for wheezing or shortness of breath. Follow with your outpatient doctor for refills 02/16/24 05/16/24  Perri DELENA Meliton Mickey., MD  amLODipine  (NORVASC ) 5 MG tablet Take 1 tablet (5 mg total) by mouth daily. 02/16/24 05/16/24  Perri DELENA Meliton Mickey., MD  atorvastatin  (LIPITOR) 10 MG tablet Take 1 tablet (10 mg total) by mouth daily. 02/16/24 05/16/24  Perri DELENA Meliton Mickey., MD  budesonide -glycopyrrolate -formoterol  (BREZTRI  AEROSPHERE) 160-9-4.8 MCG/ACT AERO inhaler Inhale 2 puffs into the lungs 2 (two) times daily. Follow with your outpatient doctor for refills. 02/16/24   Perri DELENA Meliton Mickey., MD  folic acid  (FOLVITE ) 1 MG tablet Take 1 tablet (1 mg total) by mouth daily. 02/16/24 05/16/24  Perri DELENA Meliton Mickey., MD  ipratropium-albuterol  (DUONEB) 0.5-2.5 (3) MG/3ML SOLN Take 3 mLs by nebulization every 6 (six) hours as needed (shortness of breath , wheezing). 02/16/24   Perri DELENA Meliton Mickey., MD  tamsulosin (FLOMAX) 0.4 MG CAPS capsule Take 1 capsule (0.4 mg total) by mouth daily after supper. 02/16/24 03/17/24  Perri DELENA Meliton Mickey., MD    Allergies: Tobacco    Review of Systems  All other systems reviewed and are negative.   Updated Vital Signs BP 119/71 (BP Location: Right Arm)   Pulse 87   Temp (!) 97.4 F (36.3 C)   Resp 15   SpO2 100%   Physical Exam Vitals and nursing note reviewed.  Constitutional:      General: He is not in acute distress.    Appearance: He is well-developed.  HENT:     Head: Normocephalic and atraumatic.  Eyes:     Conjunctiva/sclera: Conjunctivae normal.  Cardiovascular:     Rate and Rhythm: Normal rate and regular rhythm.     Heart sounds: No murmur heard. Pulmonary:     Effort: Pulmonary effort is normal. No respiratory distress.     Breath sounds: Normal breath sounds.  Abdominal:     Palpations: Abdomen is soft.     Tenderness: There is no abdominal tenderness.  Genitourinary:    Comments: Foley catheter in place Musculoskeletal:        General: No swelling.     Cervical back: Neck supple.  Skin:    General: Skin is warm and dry.     Capillary Refill: Capillary refill takes less than 2 seconds.  Neurological:     Mental Status: He is alert.  Psychiatric:        Mood and Affect: Mood normal.     (all labs ordered are listed, but only abnormal results are displayed) Labs Reviewed -  No data to display  EKG: None  Radiology: No results found.   Procedures   Medications Ordered in the ED - No data to display                                  Medical Decision Making Patient reports Foley catheter placed last week.  He did not follow-up with urology for further evaluation / removal.  Case discussed with Ole Bourdon, who advises close follow-up with urology in outpatient setting.  Patient given contact numbers for urology.  He is agreeable with plan to try to follow-up with urology.  He is otherwise without acute complaint.  Patient is appropriate for  close outpatient follow-up with urology.        Final diagnoses:  Foley catheter in place    ED Discharge Orders     None          Laurice Maude BROCKS, MD 02/20/24 [phone]

## 2024-02-20 NOTE — ED Notes (Addendum)
 Pt leaving for home, no new onset distress at time. Paper work reviewed with pt, bus pass given. Foely bag replaced due to bursting of bag before discharge.

## 2024-02-20 NOTE — Discharge Instructions (Signed)
 Return for any problem.  Call [facility] today.  You need follow-up regarding your Foley and continued Foley care.  Their clinic number is [phone].

## 2024-02-21 ENCOUNTER — Telehealth: Payer: Self-pay

## 2024-02-21 NOTE — Transitions of Care (Post Inpatient/ED Visit) (Signed)
   02/21/2024  Name: Jason Wong MRN: [id number] DOB: 10-Sep-1955  Today's TOC FU Call Status: Today's TOC FU Call Status:: Unsuccessful Call (3rd Attempt) Unsuccessful Call (1st Attempt) Date: 02/19/24 Unsuccessful Call (2nd Attempt) Date: 02/20/24 Unsuccessful Call (3rd Attempt) Date: 02/21/24  Attempted to reach the patient regarding the most recent Inpatient/ED visit.  Follow Up Plan: No further outreach attempts will be made at this time. We have been unable to contact the patient.   The patient has an appointment with Dr Vicci at [facility] 03/01/2024 Signature Slater Diesel, RN

## 2024-02-23 ENCOUNTER — Other Ambulatory Visit (HOSPITAL_COMMUNITY): Payer: Self-pay

## 2024-02-23 MED ORDER — TAMSULOSIN HCL 0.4 MG PO CAPS: 0.4000 mg | ORAL_CAPSULE | Freq: Every day | ORAL | 3 refills | Status: DC

## 2024-02-24 ENCOUNTER — Other Ambulatory Visit (HOSPITAL_COMMUNITY): Payer: Self-pay

## 2024-03-01 ENCOUNTER — Encounter: Payer: Self-pay | Admitting: Internal Medicine

## 2024-03-01 ENCOUNTER — Other Ambulatory Visit: Payer: Self-pay

## 2024-03-01 ENCOUNTER — Ambulatory Visit: Attending: Internal Medicine | Admitting: Internal Medicine

## 2024-03-01 VITALS — BP 114/69 | HR 90 | Ht 66.0 in | Wt 118.2 lb

## 2024-03-01 DIAGNOSIS — R7303 Prediabetes: Secondary | ICD-10-CM

## 2024-03-01 DIAGNOSIS — J449 Chronic obstructive pulmonary disease, unspecified: Secondary | ICD-10-CM

## 2024-03-01 DIAGNOSIS — M25562 Pain in left knee: Secondary | ICD-10-CM

## 2024-03-01 DIAGNOSIS — F17200 Nicotine dependence, unspecified, uncomplicated: Secondary | ICD-10-CM | POA: Diagnosis not present

## 2024-03-01 DIAGNOSIS — N401 Enlarged prostate with lower urinary tract symptoms: Secondary | ICD-10-CM

## 2024-03-01 DIAGNOSIS — Z09 Encounter for follow-up examination after completed treatment for conditions other than malignant neoplasm: Secondary | ICD-10-CM

## 2024-03-01 DIAGNOSIS — F1410 Cocaine abuse, uncomplicated: Secondary | ICD-10-CM

## 2024-03-01 DIAGNOSIS — R338 Other retention of urine: Secondary | ICD-10-CM | POA: Diagnosis not present

## 2024-03-01 MED ORDER — BREZTRI AEROSPHERE 160-9-4.8 MCG/ACT IN AERO
2.0000 | INHALATION_SPRAY | Freq: Two times a day (BID) | RESPIRATORY_TRACT | 11 refills | Status: AC
  Filled 2024-03-01: qty 10.7, 30d supply, fill #0
  Filled 2024-03-20: qty 32.1, 90d supply, fill #0

## 2024-03-01 MED ORDER — ALBUTEROL SULFATE HFA 108 (90 BASE) MCG/ACT IN AERS
2.0000 | INHALATION_SPRAY | RESPIRATORY_TRACT | 6 refills | Status: DC | PRN
  Filled 2024-03-01: qty 6.7, 17d supply, fill #0

## 2024-03-01 MED ORDER — ACETAMINOPHEN 500 MG PO TABS
500.0000 mg | ORAL_TABLET | Freq: Three times a day (TID) | ORAL | 0 refills | Status: DC | PRN
  Filled 2024-03-01: qty 60, 20d supply, fill #0

## 2024-03-01 NOTE — Progress Notes (Signed)
 Patient ID: Jason Wong, male    DOB: March 21, 1956  MRN: [id number]  CC: Hypertension (HTN f/u. Wyn received flu vax/ Pain in the genital part  due to the placement of the urinary catheter X 2 weeks / Left Knee pain /Inhaler  need )   Subjective: Jason Wong is a 68 y.o. male who presents for chronic ds management. His concerns today include:  Pt with hx of HTN, tob dep, seasonal allergies, asthma, centrilobular emphysema, insomnia, coronary atherosclerosis, pre-DM, polysub abuse (ETOH, cocaine, drug over dose accidentally 08/2020)  and hep C treated.      Discussed the use of AI scribe software for clinical note transcription with the patient, who gave verbal consent to proceed.  History of Present Illness [name] is a 68 year old male with COPD who presents for follow-up after recent hospitalization for COPD exacerbation.  He was hospitalized from November 25th to 28th for a COPD exacerbation and was treated with azithromycin  and prednisone .  Chest x-ray was negative for any changes of pneumonia and he tested negative for flu, COVID and RSV.  The assessment was that his frequent COPD exacerbations are due to medication and adherence in particular his inhalers.  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  He states the inhalers fall out of his pocket.  Refill of Brester he and albuterol  inhalers were sent to the pharmacy upon discharge from the hospital.  He confirms he has not picked those up as yet and currently does not have any inhalers.  He also reports that he has a co-pay when he has to get inhalers which also contribute to nonadherence.  He uses a nebulizer daily.  Tob/Cocaine Use: He has a history of cocaine use, with a positive urine screen during his recent hospitalization, despite stating he had given it up on previous visit He is confident in quitting on his own without a treatment program. He also smokes cigarettes, reporting  smoking one per day or sometimes none, and believes he can quit without assistance.  During his hospitalization, he experienced urinary retention, requiring a Foley catheter placement on dischg. A renal ultrasound showed mild dilation of the ureters and an enlarged prostate. He is currently taking tamsulosin  to aid urine flow.  He is seeing [facility] in follow-up recently.  He was given a voiding trial which he failed so the catheter was replaced.  He has follow-up appointment with them again on January 9th and 22nd.   He reports left knee pain since a fall prior to his hospitalization. X-rays of femur was negative for fx. states he was told that it was a bruise but was not given any pain medication.  He continues to experience pain and swelling in the knee.  Ambulates with a cane.  A1c done in the hospital was in the range for prediabetes  with level of 5.8.  He has been on and off of steroids during frequent COPD flares.    Patient Active Problem List   Diagnosis Date Noted   Substance use disorder 02/13/2024   Crack cocaine use 01/21/2024   Acute metabolic encephalopathy 01/20/2024   AKI (acute kidney injury) 01/20/2024   Protein-calorie malnutrition, severe 08/26/2023   SOB (shortness of breath) 08/24/2023   Hematuria 05/17/2023   Acute on chronic respiratory failure with hypoxia (HCC) 05/16/2023   Cystitis 05/14/2023   Influenza A 05/13/2023   Protein-calorie malnutrition 05/13/2023   Urinary retention due to  benign prostatic hyperplasia 05/13/2023   Acute respiratory failure with hypoxia (HCC) 02/17/2023   HTN (hypertension) 02/17/2023   HLD (hyperlipidemia) 02/17/2023   Tobacco use disorder 02/17/2023   Acute exacerbation of chronic obstructive pulmonary disease (COPD) (HCC) 02/17/2023   Multifocal pneumonia 01/30/2023   COPD with acute exacerbation (HCC) 01/30/2023   COPD with acute exacerbation (HCC) 06/20/2022   Multifocal pneumonia 06/20/2022   Hypokalemia  06/20/2022   Acute respiratory failure with hypoxia (HCC) 06/20/2022   Polysubstance abuse (HCC) 05/09/2022   On home oxygen  therapy 08/31/2021   Malnutrition of moderate degree 06/16/2021   Acute exacerbation of chronic obstructive pulmonary disease (COPD) (HCC) 06/14/2021   ETOH abuse 05/01/2021   Acute respiratory failure with hypoxia and hypercapnia (HCC) 04/30/2021   HLD (hyperlipidemia) 04/30/2021   COPD (chronic obstructive pulmonary disease) with emphysema (HCC) 02/19/2021   COPD exacerbation (HCC) 02/18/2021   Memory deficit 04/28/2020   Hepatitis C virus infection cured after antiviral drug therapy 04/27/2018   Enuresis 04/27/2018   Centrilobular emphysema (HCC) 04/27/2018   Seasonal allergic rhinitis 04/27/2018   Tobacco dependence 08/24/2017   Weight loss, unintentional 08/24/2017   Insomnia 06/27/2017   Prediabetes 01/24/2017   Asthma 06/10/2016   Essential hypertension 06/10/2016   Current every day smoker 06/10/2016     Medications Ordered Prior to Encounter[1]  Allergies[2]  Social History   Socioeconomic History   Marital status: Single    Spouse name: Not on file   Number of children: Not on file   Years of education: Not on file   Highest education level: Not on file  Occupational History   Not on file  Tobacco Use   Smoking status: Every Day    Types: Cigarettes   Smokeless tobacco: Never  Vaping Use   Vaping status: Never Used  Substance and Sexual Activity   Alcohol use: Yes    Alcohol/week: 2.0 standard drinks of alcohol    Types: 2 Cans of beer per week   Drug use: Not Currently    Types: Crack cocaine   Sexual activity: Yes    Partners: Female  Other Topics Concern   Not on file  Social History Narrative   ** Merged History Encounter **       Pt states he is from home with his nephew. He uses the bus to get to appointments and the grocery store and will be using the bus to get home. He has a nebulizer at home and two metal oxygen   tanks that he uses as needed. He states he doesn't know who fil   ls them, but says they do know who to call when they get empty.   Social Drivers of Health   Tobacco Use: High Risk (02/14/2024)   Patient History    Smoking Tobacco Use: Every Day    Smokeless Tobacco Use: Never    Passive Exposure: Not on file  Financial Resource Strain: Low Risk (12/01/2023)   Overall Financial Resource Strain (CARDIA)    Difficulty of Paying Living Expenses: Not very hard  Food Insecurity: No Food Insecurity (02/13/2024)   Epic    Worried About Programme Researcher, Broadcasting/film/video in the Last Year: Never true    Ran Out of Food in the Last Year: Never true  Transportation Needs: No Transportation Needs (02/13/2024)   Epic    Lack of Transportation (Medical): No    Lack of Transportation (Non-Medical): No  Physical Activity: Inactive (12/01/2023)   Exercise Vital Sign    Days  of Exercise per Week: 0 days    Minutes of Exercise per Session: 0 min  Stress: No Stress Concern Present (12/01/2023)   [employer] of Occupational Health - Occupational Stress Questionnaire    Feeling of Stress: Only a little  Social Connections: Socially Isolated (02/13/2024)   Social Connection and Isolation Panel    Frequency of Communication with Friends and Family: More than three times a week    Frequency of Social Gatherings with Friends and Family: Twice a week    Attends Religious Services: Never    Database Administrator or Organizations: No    Attends Banker Meetings: Never    Marital Status: Never married  Intimate Partner Violence: Not At Risk (02/13/2024)   Epic    Fear of Current or Ex-Partner: No    Emotionally Abused: No    Physically Abused: No    Sexually Abused: No  Depression (PHQ2-9): Low Risk (12/01/2023)   Depression (PHQ2-9)    PHQ-2 Score: 3  Alcohol Screen: Low Risk (12/01/2023)   Alcohol Screen    Last Alcohol Screening Score (AUDIT): 3  Housing: Low Risk (02/13/2024)   Epic     Unable to Pay for Housing in the Last Year: No    Number of Times Moved in the Last Year: 0    Homeless in the Last Year: No  Utilities: Not At Risk (02/13/2024)   Epic    Threatened with loss of utilities: No  Health Literacy: Adequate Health Literacy (12/01/2023)   B1300 Health Literacy    Frequency of need for help with medical instructions: Never    Family History  Problem Relation Age of Onset   Hypertension Mother    Breast cancer Sister    Hypertension Sister    Colon cancer Brother    Colon polyps Neg Hx    Esophageal cancer Neg Hx    Rectal cancer Neg Hx    Stomach cancer Neg Hx     Past Surgical History:  Procedure Laterality Date   INCISE AND DRAIN ABCESS     dog bite    ROS: Review of Systems Negative except as stated above  PHYSICAL EXAM: BP 114/69   Pulse 90   Ht 5' 6 (1.676 m)   Wt 118 lb 3.2 oz (53.6 kg)   SpO2 97%   BMI 19.08 kg/m   Physical Exam   General appearance -older African-American male in NAD. Mental status - normal mood, behavior, speech, dress, motor activity, and thought processes Chest -breath sounds mild to moderately decreased bilaterally without any wheezes or crackles. Heart -precordium is hyperdynamic, regular rate and rhythm. Musculoskeletal -left knee: Mild to moderate edema compared to the right knee.  Likely has fluid.  No point tenderness.  Mild crepitus with passive range of motion.  He has his cane with him.  Gait is slow favoring LT knee Extremities -no lower extremity edema.     Latest Ref Rng & Units 02/14/2024    3:59 AM 02/13/2024    3:57 PM 02/13/2024   10:34 AM  CMP  Glucose 70 - 99 mg/dL 806   81   BUN 8 - 23 mg/dL 15   10   Creatinine 9.38 - 1.24 mg/dL 9.03   9.07   Sodium 864 - 145 mmol/L 138  144  141   Potassium 3.5 - 5.1 mmol/L 4.1  4.1  4.4   Chloride 98 - 111 mmol/L 102   107   CO2 22 -  32 mmol/L 23   21   Calcium  8.9 - 10.3 mg/dL 9.8   9.6    Lipid Panel     Component Value Date/Time   CHOL  205 (H) 01/06/2024 0341   CHOL 171 09/06/2019 1053   TRIG 22 01/06/2024 0341   HDL 81 01/06/2024 0341   HDL 70 09/06/2019 1053   CHOLHDL 2.5 01/06/2024 0341   VLDL 4 01/06/2024 0341   LDLCALC 120 (H) 01/06/2024 0341   LDLCALC 90 09/06/2019 1053    CBC    Component Value Date/Time   WBC 3.3 (L) 02/14/2024 0359   RBC 4.07 (L) 02/14/2024 0359   HGB 13.5 02/14/2024 0359   HGB 17.2 09/06/2019 1053   HCT 39.7 02/14/2024 0359   HCT 49.9 09/06/2019 1053   PLT 289 02/14/2024 0359   PLT 274 09/06/2019 1053   MCV 97.5 02/14/2024 0359   MCV 98 (H) 09/06/2019 1053   MCH 33.2 02/14/2024 0359   MCHC 34.0 02/14/2024 0359   RDW 13.1 02/14/2024 0359   RDW 11.5 (L) 09/06/2019 1053   LYMPHSABS 6.9 (H) 01/19/2024 2130   LYMPHSABS 2.5 06/13/2016 1110   MONOABS 0.4 01/19/2024 2130   EOSABS 0.1 01/19/2024 2130   EOSABS 0.1 06/13/2016 1110   BASOSABS 0.0 01/19/2024 2130   BASOSABS 0.0 06/13/2016 1110    ASSESSMENT AND PLAN: 1. Hospital discharge follow-up (Primary)  2. Chronic obstructive pulmonary disease, unspecified COPD type (HCC) Patient with frequent exacerbations and hospitalizations. Encouraged him to get the Breztri  and albuterol  inhalers and discussed ways to secure them when he has them with him so that they are not falling out of his pocket. - budesonide -glycopyrrolate -formoterol  (BREZTRI  AEROSPHERE) 160-9-4.8 MCG/ACT AERO inhaler; Inhale 2 puffs into the lungs 2 (two) times daily. Follow with your outpatient doctor for refills.  Dispense: 10.7 g; Refill: 11 - albuterol  (VENTOLIN  HFA) 108 (90 Base) MCG/ACT inhaler; Inhale 2 puffs into the lungs every 4 (four) hours as needed for wheezing or shortness of breath. Follow with your outpatient doctor for refills  Dispense: 20.1 g; Refill: 6  3. Cocaine use disorder (HCC) Strongly advised to consider getting into a treatment program or going to NA.  Patient states that he does not need a program stating that he has quit on his own.  4.  Tobacco dependence Strongly advised to quit smoking completely.  He declines medication to help him with quitting.  5. Urinary retention due to benign prostatic hyperplasia Patient still has Foley catheter in place and is followed by urology  6. Prediabetes Likely due to frequent prednisone  use.  Discussed and encouraged healthy eating habits.  Avoid sugary drinks.  He states that he drinks a lot of sodas and tea.  7. Acute pain of left knee Likely has jt effusion. Ordered x-ray of left knee at [facility]. - Referred to orthopedics for further evaluation. - Prescribed extra strength Tylenol  for pain management. - AMB referral to orthopedics - DG Hip Unilat W OR W/O Pelvis 2-3 Views Left; Future   Patient was given the opportunity to ask questions.  Patient verbalized understanding of the plan and was able to repeat key elements of the plan.   This documentation was completed using Paediatric nurse.  Any transcriptional errors are unintentional.  No orders of the defined types were placed in this encounter.    Requested Prescriptions    No prescriptions requested or ordered in this encounter    No follow-ups on file.  Barnie Louder, MD,  FACP     [1]  Current Outpatient Medications on File Prior to Visit  Medication Sig Dispense Refill   albuterol  (VENTOLIN  HFA) 108 (90 Base) MCG/ACT inhaler Inhale 2 puffs into the lungs every 4 (four) hours as needed for wheezing or shortness of breath. Follow with your outpatient doctor for refills 6.7 g 0   amLODipine  (NORVASC ) 5 MG tablet Take 1 tablet (5 mg total) by mouth daily. 90 tablet 0   atorvastatin  (LIPITOR) 10 MG tablet Take 1 tablet (10 mg total) by mouth daily. 90 tablet 0   budesonide -glycopyrrolate -formoterol  (BREZTRI  AEROSPHERE) 160-9-4.8 MCG/ACT AERO inhaler Inhale 2 puffs into the lungs 2 (two) times daily. Follow with your outpatient doctor for refills. 10.7 g 0   folic acid  (FOLVITE ) 1 MG  tablet Take 1 tablet (1 mg total) by mouth daily. 90 tablet 0   ipratropium-albuterol  (DUONEB) 0.5-2.5 (3) MG/3ML SOLN Take 3 mLs by nebulization every 6 (six) hours as needed (shortness of breath , wheezing). 150 mL 0   tamsulosin  (FLOMAX ) 0.4 MG CAPS capsule Take 1 capsule (0.4 mg total) by mouth daily. 90 capsule 3   No current facility-administered medications on file prior to visit.  [2]  Allergies Allergen Reactions   Tobacco Shortness Of Breath and Other (See Comments)    CIGARETTE SMOKE TRIGGERS THE PATIENT'S RESPIRATORY ISSUES!!

## 2024-03-01 NOTE — Patient Instructions (Signed)
°  VISIT SUMMARY: You had a follow-up appointment today after your recent hospitalization for a COPD exacerbation. We discussed your COPD management, urinary retention, cocaine use, smoking habits, knee pain, and prediabetes.  YOUR PLAN: -CHRONIC OBSTRUCTIVE PULMONARY DISEASE (COPD): COPD is a chronic lung disease that makes it hard to breathe. You recently had a flare-up and were treated in the hospital. Please pick up your Breztri  and albuterol  inhalers from the pharmacy and use them as prescribed. Keep your inhalers in a zipped pocket to avoid losing them.  -BENIGN PROSTATIC HYPERPLASIA WITH URINARY RETENTION: This condition involves an enlarged prostate that can block urine flow. Continue taking tamsulosin  to help with urination and follow up with your urologist on January 9th and 22nd for further management.  -COCAINE USE DISORDER: This is a condition where you have difficulty stopping cocaine use. Although you declined a treatment program, it is highly recommended to seek help to quit.  -NICOTINE  DEPENDENCE: This means you are addicted to nicotine  from smoking. It is important to quit smoking, especially because of your COPD. If you need help, consider using nicotine  replacement therapy.  -LEFT KNEE PAIN AND SWELLING: You have pain and swelling in your left knee from a fall. An x-ray has been ordered at [facility], and you have been referred to orthopedics for further evaluation. Take extra strength Tylenol  for pain.  -PREDIABETES: This means your blood sugar levels are higher than normal but not high enough to be diabetes. Avoid sugary drinks and alcohol, and drink more water to help manage this condition.  INSTRUCTIONS: Please pick up your Breztri  and albuterol  inhalers from the pharmacy and use them as prescribed. Follow up with your urologist on January 9th and 22nd for further management of your urinary retention. Get an x-ray of your left knee at [facility] and follow up  with orthopedics for further evaluation. If you have any questions or concerns, please contact our office.                      Contains text generated by Abridge.                                 Contains text generated by Abridge.

## 2024-03-13 ENCOUNTER — Emergency Department (HOSPITAL_COMMUNITY)
Admission: EM | Admit: 2024-03-13 | Discharge: 2024-03-13 | Disposition: A | Discharge status: Home / Self Care | Attending: Emergency Medicine | Admitting: Emergency Medicine

## 2024-03-13 ENCOUNTER — Other Ambulatory Visit: Payer: Self-pay

## 2024-03-13 DIAGNOSIS — N390 Urinary tract infection, site not specified: Secondary | ICD-10-CM | POA: Diagnosis not present

## 2024-03-13 DIAGNOSIS — I10 Essential (primary) hypertension: Secondary | ICD-10-CM | POA: Diagnosis not present

## 2024-03-13 DIAGNOSIS — Z79899 Other long term (current) drug therapy: Secondary | ICD-10-CM | POA: Diagnosis not present

## 2024-03-13 DIAGNOSIS — J449 Chronic obstructive pulmonary disease, unspecified: Secondary | ICD-10-CM | POA: Diagnosis not present

## 2024-03-13 DIAGNOSIS — R338 Other retention of urine: Secondary | ICD-10-CM

## 2024-03-13 DIAGNOSIS — R300 Dysuria: Secondary | ICD-10-CM | POA: Diagnosis present

## 2024-03-13 LAB — COMPREHENSIVE METABOLIC PANEL WITH GFR
ALT: 9 U/L (ref 0–44)
AST: 29 U/L (ref 15–41)
Albumin: 4.3 g/dL (ref 3.5–5.0)
Alkaline Phosphatase: 95 U/L (ref 38–126)
Anion gap: 16 — ABNORMAL HIGH (ref 5–15)
BUN: 16 mg/dL (ref 8–23)
CO2: 16 mmol/L — ABNORMAL LOW (ref 22–32)
Calcium: 9.8 mg/dL (ref 8.9–10.3)
Chloride: 105 mmol/L (ref 98–111)
Creatinine, Ser: 1.42 mg/dL — ABNORMAL HIGH (ref 0.61–1.24)
GFR, Estimated: 54 mL/min — ABNORMAL LOW
Glucose, Bld: 106 mg/dL — ABNORMAL HIGH (ref 70–99)
Potassium: 5.1 mmol/L (ref 3.5–5.1)
Sodium: 137 mmol/L (ref 135–145)
Total Bilirubin: 1 mg/dL (ref 0.0–1.2)
Total Protein: 7.9 g/dL (ref 6.5–8.1)

## 2024-03-13 LAB — URINALYSIS, ROUTINE W REFLEX MICROSCOPIC
Bilirubin Urine: NEGATIVE
Glucose, UA: NEGATIVE mg/dL
Ketones, ur: 5 mg/dL — AB
Nitrite: NEGATIVE
Protein, ur: >=300 mg/dL — AB
Specific Gravity, Urine: 1.015 (ref 1.005–1.030)
WBC, UA: >50 WBC/hpf (ref 0–5)
pH: 8 (ref 5.0–8.0)

## 2024-03-13 LAB — CBC WITH DIFFERENTIAL/PLATELET
Abs Immature Granulocytes: 0.02 K/uL (ref 0.00–0.07)
Basophils Absolute: 0 K/uL (ref 0.0–0.1)
Basophils Relative: 1 %
Eosinophils Absolute: 0 K/uL (ref 0.0–0.5)
Eosinophils Relative: 0 %
HCT: 49.4 % (ref 39.0–52.0)
Hemoglobin: 15.5 g/dL (ref 13.0–17.0)
Immature Granulocytes: 0 %
Lymphocytes Relative: 17 %
Lymphs Abs: 1.2 K/uL (ref 0.7–4.0)
MCH: 33.2 pg (ref 26.0–34.0)
MCHC: 31.4 g/dL (ref 30.0–36.0)
MCV: 105.8 fL — ABNORMAL HIGH (ref 80.0–100.0)
Monocytes Absolute: 0.9 K/uL (ref 0.1–1.0)
Monocytes Relative: 14 %
Neutro Abs: 4.7 K/uL (ref 1.7–7.7)
Neutrophils Relative %: 68 %
Platelets: 303 K/uL (ref 150–400)
RBC: 4.67 MIL/uL (ref 4.22–5.81)
RDW: 13.5 % (ref 11.5–15.5)
WBC: 6.8 K/uL (ref 4.0–10.5)
nRBC: 0 % (ref 0.0–0.2)

## 2024-03-13 MED ORDER — CEPHALEXIN 500 MG PO CAPS
500.0000 mg | ORAL_CAPSULE | Freq: Two times a day (BID) | ORAL | 0 refills | Status: DC
  Filled 2024-03-13: qty 14, 7d supply, fill #0

## 2024-03-13 MED ORDER — HYDROCODONE-ACETAMINOPHEN 5-325 MG PO TABS
1.0000 | ORAL_TABLET | Freq: Once | ORAL | Status: AC
  Administered 2024-03-13: 1 via ORAL
  Filled 2024-03-13: qty 1

## 2024-03-13 MED ORDER — ALBUTEROL SULFATE HFA 108 (90 BASE) MCG/ACT IN AERS: 2.0000 | INHALATION_SPRAY | RESPIRATORY_TRACT | Status: DC | PRN

## 2024-03-13 MED ORDER — CEPHALEXIN 250 MG PO CAPS
500.0000 mg | ORAL_CAPSULE | Freq: Once | ORAL | Status: AC
  Administered 2024-03-13: 500 mg via ORAL
  Filled 2024-03-13: qty 2

## 2024-03-13 MED ORDER — IPRATROPIUM-ALBUTEROL 0.5-2.5 (3) MG/3ML IN SOLN
3.0000 mL | Freq: Once | RESPIRATORY_TRACT | Status: AC
  Administered 2024-03-13: 3 mL via RESPIRATORY_TRACT
  Filled 2024-03-13: qty 3

## 2024-03-13 NOTE — Discharge Instructions (Addendum)
 You had a new Foley catheter placed today but it does look like you have a little bit of a urinary tract infection and you were prescribed an antibiotic.  You will take your next dose tomorrow because you had your first dose today.  If you have any more problems with the catheter not draining or start running high temperature and having nausea and vomiting return to the emergency room.

## 2024-03-13 NOTE — ED Triage Notes (Signed)
 Pt. Stated, Im unable to piss, it burns and I have this bag and I'm hurting bad

## 2024-03-13 NOTE — ED Provider Notes (Signed)
 " [place] EMERGENCY DEPARTMENT AT [facility] Provider Note   CSN: [id number] Arrival date & time: 03/13/24  9249     Patient presents with: Dysuria and Urinary Retention   Jason Wong is a 68 y.o. male.   Patient is a 68 year old male with a history of hypertension, hyperlipidemia, COPD, who has had an indwelling Foley catheter due to urinary retention for the last few months which was last replaced at the beginning of the month.  He is presenting today due to complaint of severe penile pain and inability to urinate.  He reports this started this morning and the catheter is not draining.  He denies any fever or vomiting.  He states his appointment is on 9 January to see urology.   Dysuria Presenting symptoms: dysuria        Prior to Admission medications  Medication Sig Start Date End Date Taking? Authorizing Provider  cephALEXin  (KEFLEX ) 500 MG capsule Take 1 capsule (500 mg total) by mouth 2 (two) times daily. 03/13/24  Yes Doretha Folks, MD  acetaminophen  (TYLENOL ) 500 MG tablet Take 1 tablet (500 mg total) by mouth every 8 (eight) hours as needed. 03/01/24   Vicci Barnie NOVAK, MD  albuterol  (VENTOLIN  HFA) 108 (90 Base) MCG/ACT inhaler Inhale 2 puffs into the lungs every 4 (four) hours as needed for wheezing or shortness of breath. Follow with your outpatient doctor for refills 03/01/24 05/30/24  Vicci Barnie NOVAK, MD  amLODipine  (NORVASC ) 5 MG tablet Take 1 tablet (5 mg total) by mouth daily. 02/16/24 05/16/24  Perri DELENA Meliton Mickey., MD  atorvastatin  (LIPITOR) 10 MG tablet Take 1 tablet (10 mg total) by mouth daily. 02/16/24 05/16/24  Perri DELENA Meliton Mickey., MD  budesonide -glycopyrrolate -formoterol  (BREZTRI  AEROSPHERE) 160-9-4.8 MCG/ACT AERO inhaler Inhale 2 puffs into the lungs 2 (two) times daily. Follow with your outpatient doctor for refills. 03/01/24   Vicci Barnie NOVAK, MD  folic acid  (FOLVITE ) 1 MG tablet Take 1 tablet (1 mg total) by mouth daily. 02/16/24  05/16/24  Perri DELENA Meliton Mickey., MD  ipratropium-albuterol  (DUONEB) 0.5-2.5 (3) MG/3ML SOLN Take 3 mLs by nebulization every 6 (six) hours as needed (shortness of breath , wheezing). 02/16/24   Perri DELENA Meliton Mickey., MD  tamsulosin  (FLOMAX ) 0.4 MG CAPS capsule Take 1 capsule (0.4 mg total) by mouth daily. 02/23/24       Allergies: Tobacco    Review of Systems  Genitourinary:  Positive for dysuria.    Updated Vital Signs BP 116/84 (BP Location: Left Arm)   Pulse 88   Temp 98.5 F (36.9 C) (Oral)   Resp 17   SpO2 100%   Physical Exam Vitals and nursing note reviewed.  Constitutional:      General: He is in acute distress.     Appearance: He is well-developed.     Comments: Appears uncomfortable  HENT:     Head: Normocephalic and atraumatic.  Eyes:     Conjunctiva/sclera: Conjunctivae normal.     Pupils: Pupils are equal, round, and reactive to light.  Cardiovascular:     Rate and Rhythm: Normal rate and regular rhythm.     Heart sounds: No murmur heard. Pulmonary:     Effort: Pulmonary effort is normal. No respiratory distress.     Breath sounds: Normal breath sounds. No wheezing or rales.  Abdominal:     General: There is no distension.     Palpations: Abdomen is soft.     Tenderness: There is no abdominal tenderness. There  is no guarding or rebound.  Genitourinary:    Comments: Catheter is in place but not draining urine. Musculoskeletal:        General: No tenderness. Normal range of motion.     Cervical back: Normal range of motion and neck supple.  Skin:    General: Skin is warm and dry.     Findings: No erythema or rash.  Neurological:     Mental Status: He is alert and oriented to person, place, and time. Mental status is at baseline.  Psychiatric:        Behavior: Behavior normal.     (all labs ordered are listed, but only abnormal results are displayed) Labs Reviewed  URINALYSIS, ROUTINE W REFLEX MICROSCOPIC - Abnormal; Notable for the following  components:      Result Value   APPearance TURBID (*)    Hgb urine dipstick SMALL (*)    Ketones, ur 5 (*)    Protein, ur >=300 (*)    Leukocytes,Ua MODERATE (*)    Bacteria, UA MANY (*)    All other components within normal limits  COMPREHENSIVE METABOLIC PANEL WITH GFR - Abnormal; Notable for the following components:   CO2 16 (*)    Glucose, Bld 106 (*)    Creatinine, Ser 1.42 (*)    GFR, Estimated 54 (*)    Anion gap 16 (*)    All other components within normal limits  CBC WITH DIFFERENTIAL/PLATELET - Abnormal; Notable for the following components:   MCV 105.8 (*)    All other components within normal limits  URINE CULTURE  CBC WITH DIFFERENTIAL/PLATELET    EKG: None  Radiology: No results found.   Procedures   Medications Ordered in the ED  ipratropium-albuterol  (DUONEB) 0.5-2.5 (3) MG/3ML nebulizer solution 3 mL (has no administration in time range)  albuterol  (VENTOLIN  HFA) 108 (90 Base) MCG/ACT inhaler 2 puff (has no administration in time range)  cephALEXin  (KEFLEX ) capsule 500 mg (has no administration in time range)  HYDROcodone -acetaminophen  (NORCO/VICODIN) 5-325 MG per tablet 1 tablet (1 tablet Oral Given 03/13/24 1744)                                    Medical Decision Making Amount and/or Complexity of Data Reviewed Labs: ordered. Decision-making details documented in ED Course.  Risk Prescription drug management.   Pt with multiple medical problems and comorbidities and presenting today with a complaint that caries a high risk for morbidity and mortality.  Here today with complaint of inability to urinate with severe pain in his penis with a Foley catheter in place.  Based on notes patient's Foley catheter was last changed on 2 December.  He reports just today it stopped draining.  Patient has been waiting 8 hours and has had very minimal output from his bag.  Foley catheter was removed with significant urine drainage after.  Patient's vital signs  are stable. I independently interpreted patient's labs and CBC was within normal limits, CMP with mild increase in creatinine of 1.4 most likely related to the obstruction postrenal.  After Foley catheter was removed patient was drinking and eating however was not able to urinate significantly and a new catheter was placed.  Encouraged him to follow-up with urology on the ninth as planned. On reevaluation bedside ultrasound showed significant urinary retention still.  Patient is complaining of dysuria now but denies any abdominal pain.  A new Foley catheter  was placed.  Cath UA also shows concern for infection with red cells, white cells, many bacteria and leukocytes.  He was started on Keflex  and a urine culture was ordered.  At this time patient is stable for discharge.     Final diagnoses:  Lower urinary tract infectious disease  Acute urinary retention    ED Discharge Orders          Ordered    cephALEXin  (KEFLEX ) 500 MG capsule  2 times daily        03/13/24 1857               Doretha Folks, MD 03/13/24 1858  "

## 2024-03-13 NOTE — ED Provider Notes (Incomplete)
 " [place] EMERGENCY DEPARTMENT AT [facility] Provider Note   CSN: [id number] Arrival date & time: 03/13/24  0750     History {Add pertinent medical, surgical, social history, OB history to HPI:1} Chief Complaint  Patient presents with   Dysuria   Urinary Retention    Jason Wong is a 68 y.o. male with PMH as listed below who presents with ***.    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 Authorizing Provider  acetaminophen  (TYLENOL ) 500 MG tablet Take 1 tablet (500 mg total) by mouth every 8 (eight) hours as needed. 03/01/24   Vicci Barnie NOVAK, MD  albuterol  (VENTOLIN  HFA) 108 [phone] Base) MCG/ACT inhaler Inhale 2 puffs into the lungs every 4 (four) hours as needed for wheezing or shortness of breath. Follow with your outpatient doctor for refills 03/01/24 05/30/24  Vicci Barnie NOVAK, MD  amLODipine  (NORVASC ) 5 MG tablet Take 1 tablet (5 mg total) by mouth daily. 02/16/24 05/16/24  Perri DELENA Meliton Mickey., MD  atorvastatin  (LIPITOR) 10 MG tablet Take 1 tablet (10 mg total) by mouth daily. 02/16/24 05/16/24  Perri DELENA Meliton Mickey., MD  budesonide -glycopyrrolate -formoterol  (BREZTRI  AEROSPHERE) 160-9-4.8 MCG/ACT AERO inhaler Inhale 2 puffs into the lungs 2 (two) times daily. Follow with your outpatient doctor for refills. 03/01/24   Vicci Barnie NOVAK, MD  folic acid  (FOLVITE ) 1 MG tablet Take 1 tablet (1 mg total) by mouth daily. 02/16/24 05/16/24  Perri DELENA Meliton Mickey., MD  ipratropium-albuterol  (DUONEB) 0.5-2.5 (3) MG/3ML SOLN Take 3 mLs by nebulization every 6 (six) hours as needed (shortness of breath , wheezing). 02/16/24   Perri DELENA Meliton Mickey., MD  tamsulosin  (FLOMAX ) 0.4 MG CAPS capsule Take 1 capsule (0.4 mg total) by mouth daily. 02/23/24         Allergies    Tobacco    Review of Systems   Review of Systems A 10 point review of systems was performed and is negative unless otherwise reported in HPI.  Physical Exam Updated Vital Signs BP 98/74   Pulse 92   Temp 99 F (37.2 C) (Oral)   Resp (!) 21   SpO2 94%  Physical Exam General: Normal appearing {Desc; male/male:11659}, lying in bed.  HEENT: PERRLA, Sclera anicteric, MMM, trachea midline.  Cardiology: RRR, no murmurs/rubs/gallops. BL radial and DP pulses equal bilaterally.  Resp: Normal respiratory rate and effort. CTAB, no wheezes, rhonchi, crackles.  Abd: Soft, non-tender, non-distended. No rebound tenderness or guarding.  GU: Deferred. MSK: No peripheral edema or signs of trauma. Extremities without deformity or TTP. No cyanosis or clubbing. Skin: warm, dry. No rashes or lesions. Back: No CVA tenderness Neuro: A&Ox4, CNs II-XII grossly intact. MAEs. Sensation grossly intact.  Psych: Normal mood and affect.   ED Results / Procedures / Treatments   Labs (all labs ordered are listed, but only abnormal results are displayed) Labs Reviewed  COMPREHENSIVE METABOLIC PANEL WITH GFR - Abnormal; Notable for the following components:      Result Value   CO2 16 (*)    Glucose, Bld 106 (*)    Creatinine, Ser 1.42 (*)  GFR, Estimated 54 (*)    Anion gap 16 (*)    All other components within normal limits  CBC WITH DIFFERENTIAL/PLATELET - Abnormal; Notable for the following components:   MCV 105.8 (*)    All other components within normal limits  URINALYSIS, ROUTINE W REFLEX MICROSCOPIC  CBC WITH DIFFERENTIAL/PLATELET    EKG None  Radiology No results found.  Procedures Procedures  {Document cardiac monitor, telemetry assessment procedure when appropriate:1}  Medications Ordered in ED Medications - No data to display  ED Course/  Medical Decision Making/ A&P                          Medical Decision Making Amount and/or Complexity of Data Reviewed Labs: ordered.    This patient presents to the ED for concern of ***, this involves an extensive number of treatment options, and is a complaint that carries with it a high risk of complications and morbidity.  I considered the following differential and admission for this acute, potentially life threatening condition.   MDM:    ***     Labs: I Ordered, and personally interpreted labs.  The pertinent results include:  ***  Imaging Studies ordered: I ordered imaging studies including *** I independently visualized and interpreted imaging. I agree with the radiologist interpretation  Additional history obtained from ***.  External records from outside source obtained and reviewed including ***  Cardiac Monitoring: The patient was maintained on a cardiac monitor.  I personally viewed and interpreted the cardiac monitored which showed an underlying rhythm of: ***  Reevaluation: After the interventions noted above, I reevaluated the patient and found that they have :{resolved/improved/worsened:23923::improved}  Social Determinants of Health: ***  Disposition:  ***  Co morbidities that complicate the patient evaluation  Past Medical History:  Diagnosis Date   Asthma    COPD (chronic obstructive pulmonary disease) (HCC)    Hepatitis C antibody positive in blood    HLD (hyperlipidemia)    HTN (hypertension)    On home oxygen  therapy    per pt 08-31-2021 uses 02 about every other day     Medicines No orders of the defined types were placed in this encounter.   I have reviewed the patients home medicines and have made adjustments as needed  Problem List / ED Course: Problem List Items Addressed This Visit   None        {Document critical care time when appropriate:1} {Document review of labs and clinical decision tools ie heart  score, Chads2Vasc2 etc:1}  {Document your independent review of radiology images, and any outside records:1} {Document your discussion with family members, caretakers, and with consultants:1} {Document social determinants of health affecting pt's care:1} {Document your decision making why or why not admission, treatments were needed:1}  This note was created using dictation software, which may contain spelling or grammatical errors.  "

## 2024-03-15 ENCOUNTER — Other Ambulatory Visit: Payer: Self-pay

## 2024-03-20 ENCOUNTER — Other Ambulatory Visit: Payer: Self-pay

## 2024-03-20 ENCOUNTER — Emergency Department (HOSPITAL_COMMUNITY)

## 2024-03-20 ENCOUNTER — Inpatient Hospital Stay (HOSPITAL_COMMUNITY)
Admission: EM | Admit: 2024-03-20 | Discharge: 2024-03-22 | DRG: 191 | Disposition: A | Discharge status: Home / Self Care | Attending: Family Medicine | Admitting: Family Medicine

## 2024-03-20 DIAGNOSIS — I10 Essential (primary) hypertension: Secondary | ICD-10-CM | POA: Diagnosis present

## 2024-03-20 DIAGNOSIS — Z79899 Other long term (current) drug therapy: Secondary | ICD-10-CM

## 2024-03-20 DIAGNOSIS — F17210 Nicotine dependence, cigarettes, uncomplicated: Secondary | ICD-10-CM | POA: Diagnosis present

## 2024-03-20 DIAGNOSIS — Z800 Family history of malignant neoplasm of digestive organs: Secondary | ICD-10-CM

## 2024-03-20 DIAGNOSIS — J441 Chronic obstructive pulmonary disease with (acute) exacerbation: Principal | ICD-10-CM | POA: Diagnosis present

## 2024-03-20 DIAGNOSIS — Z8249 Family history of ischemic heart disease and other diseases of the circulatory system: Secondary | ICD-10-CM

## 2024-03-20 DIAGNOSIS — R0902 Hypoxemia: Secondary | ICD-10-CM | POA: Diagnosis present

## 2024-03-20 DIAGNOSIS — B1920 Unspecified viral hepatitis C without hepatic coma: Secondary | ICD-10-CM | POA: Diagnosis present

## 2024-03-20 DIAGNOSIS — Z7951 Long term (current) use of inhaled steroids: Secondary | ICD-10-CM

## 2024-03-20 DIAGNOSIS — Z803 Family history of malignant neoplasm of breast: Secondary | ICD-10-CM

## 2024-03-20 DIAGNOSIS — Z91148 Patient's other noncompliance with medication regimen for other reason: Secondary | ICD-10-CM

## 2024-03-20 DIAGNOSIS — F1410 Cocaine abuse, uncomplicated: Secondary | ICD-10-CM | POA: Diagnosis present

## 2024-03-20 DIAGNOSIS — E785 Hyperlipidemia, unspecified: Secondary | ICD-10-CM | POA: Diagnosis present

## 2024-03-20 DIAGNOSIS — N1330 Unspecified hydronephrosis: Secondary | ICD-10-CM | POA: Diagnosis present

## 2024-03-20 DIAGNOSIS — D7589 Other specified diseases of blood and blood-forming organs: Secondary | ICD-10-CM | POA: Diagnosis present

## 2024-03-20 LAB — CBC WITH DIFFERENTIAL/PLATELET
Abs Immature Granulocytes: 0.01 K/uL (ref 0.00–0.07)
Basophils Absolute: 0.1 K/uL (ref 0.0–0.1)
Basophils Relative: 1 %
Eosinophils Absolute: 0.6 K/uL — ABNORMAL HIGH (ref 0.0–0.5)
Eosinophils Relative: 9 %
HCT: 44.5 % (ref 39.0–52.0)
Hemoglobin: 14.7 g/dL (ref 13.0–17.0)
Immature Granulocytes: 0 %
Lymphocytes Relative: 43 %
Lymphs Abs: 2.7 K/uL (ref 0.7–4.0)
MCH: 32.2 pg (ref 26.0–34.0)
MCHC: 33 g/dL (ref 30.0–36.0)
MCV: 97.6 fL (ref 80.0–100.0)
Monocytes Absolute: 0.7 K/uL (ref 0.1–1.0)
Monocytes Relative: 11 %
Neutro Abs: 2.4 K/uL (ref 1.7–7.7)
Neutrophils Relative %: 36 %
Platelets: 393 K/uL (ref 150–400)
RBC: 4.56 MIL/uL (ref 4.22–5.81)
RDW: 13.4 % (ref 11.5–15.5)
WBC: 6.5 K/uL (ref 4.0–10.5)
nRBC: 0 % (ref 0.0–0.2)

## 2024-03-20 LAB — BASIC METABOLIC PANEL WITH GFR
Anion gap: 13 (ref 5–15)
BUN: 8 mg/dL (ref 8–23)
CO2: 24 mmol/L (ref 22–32)
Calcium: 9.8 mg/dL (ref 8.9–10.3)
Chloride: 103 mmol/L (ref 98–111)
Creatinine, Ser: 0.87 mg/dL (ref 0.61–1.24)
GFR, Estimated: >60 mL/min
Glucose, Bld: 92 mg/dL (ref 70–99)
Potassium: 4 mmol/L (ref 3.5–5.1)
Sodium: 140 mmol/L (ref 135–145)

## 2024-03-20 LAB — RESP PANEL BY RT-PCR (RSV, FLU A&B, COVID)  RVPGX2
Influenza A by PCR: NEGATIVE
Influenza B by PCR: NEGATIVE
Resp Syncytial Virus by PCR: NEGATIVE
SARS Coronavirus 2 by RT PCR: NEGATIVE

## 2024-03-20 MED ORDER — ACETAMINOPHEN 500 MG PO TABS
1000.0000 mg | ORAL_TABLET | Freq: Once | ORAL | Status: AC
  Administered 2024-03-20: 1000 mg via ORAL
  Filled 2024-03-20: qty 2

## 2024-03-20 NOTE — ED Provider Triage Note (Signed)
 Emergency Medicine Provider Triage Evaluation Note  Jason Wong , a 68 y.o. male  was evaluated in triage.  Pt complains of shortness of breath. History of COPD. Reports exertional dyspnea. Also complaining of pelvic discomfort where urinary catheter is inserted.  Review of Systems  Positive: Shortness of breath Negative: Chest pain, abdominal pain  Physical Exam  BP 107/83 (BP Location: Right Arm)   Pulse 94   Temp 97.6 F (36.4 C)   Resp 20   Ht 5' 7 (1.702 m)   Wt 53.5 kg   SpO2 99%   BMI 18.48 kg/m  Gen:   Awake, no distress   Resp:  Normal effort. Lungs CTA bilaterally. Speaking in complete sentences  MSK:   Moves extremities without difficulty  Other:  No lower extremity edema  Medical Decision Making  Medically screening exam initiated at 6:12 PM.  Appropriate orders placed.  Myrtle Barnhard was informed that the remainder of the evaluation will be completed by another provider, this initial triage assessment does not replace that evaluation, and the importance of remaining in the ED until their evaluation is complete.     Sharps Lenis, NP 03/20/24 [id number]

## 2024-03-20 NOTE — ED Triage Notes (Signed)
 Shortness of breath since this morning. Talking in complete sentences and does not appear to be in distress while at rest.

## 2024-03-20 NOTE — ED Triage Notes (Signed)
 Pt arrives POV complains of SHOB since last night. Hx of COPD. Takes neb treatments at home with no relief. Not on oxygen . Denies cough. RR regular but labored.

## 2024-03-21 ENCOUNTER — Inpatient Hospital Stay (HOSPITAL_COMMUNITY)

## 2024-03-21 ENCOUNTER — Encounter (HOSPITAL_COMMUNITY): Payer: Self-pay | Admitting: Hospitalist

## 2024-03-21 DIAGNOSIS — Z803 Family history of malignant neoplasm of breast: Secondary | ICD-10-CM | POA: Diagnosis not present

## 2024-03-21 DIAGNOSIS — B1920 Unspecified viral hepatitis C without hepatic coma: Secondary | ICD-10-CM | POA: Diagnosis present

## 2024-03-21 DIAGNOSIS — Z800 Family history of malignant neoplasm of digestive organs: Secondary | ICD-10-CM | POA: Diagnosis not present

## 2024-03-21 DIAGNOSIS — F17210 Nicotine dependence, cigarettes, uncomplicated: Secondary | ICD-10-CM | POA: Diagnosis present

## 2024-03-21 DIAGNOSIS — Z91148 Patient's other noncompliance with medication regimen for other reason: Secondary | ICD-10-CM | POA: Diagnosis not present

## 2024-03-21 DIAGNOSIS — N1330 Unspecified hydronephrosis: Secondary | ICD-10-CM | POA: Diagnosis present

## 2024-03-21 DIAGNOSIS — F1410 Cocaine abuse, uncomplicated: Secondary | ICD-10-CM | POA: Diagnosis present

## 2024-03-21 DIAGNOSIS — Z7951 Long term (current) use of inhaled steroids: Secondary | ICD-10-CM | POA: Diagnosis not present

## 2024-03-21 DIAGNOSIS — R0902 Hypoxemia: Secondary | ICD-10-CM | POA: Diagnosis present

## 2024-03-21 DIAGNOSIS — Z79899 Other long term (current) drug therapy: Secondary | ICD-10-CM | POA: Diagnosis not present

## 2024-03-21 DIAGNOSIS — E785 Hyperlipidemia, unspecified: Secondary | ICD-10-CM | POA: Diagnosis present

## 2024-03-21 DIAGNOSIS — J441 Chronic obstructive pulmonary disease with (acute) exacerbation: Secondary | ICD-10-CM | POA: Diagnosis present

## 2024-03-21 DIAGNOSIS — I10 Essential (primary) hypertension: Secondary | ICD-10-CM | POA: Diagnosis present

## 2024-03-21 DIAGNOSIS — Z8249 Family history of ischemic heart disease and other diseases of the circulatory system: Secondary | ICD-10-CM | POA: Diagnosis not present

## 2024-03-21 DIAGNOSIS — D7589 Other specified diseases of blood and blood-forming organs: Secondary | ICD-10-CM | POA: Diagnosis present

## 2024-03-21 MED ORDER — AMLODIPINE BESYLATE 5 MG PO TABS
5.0000 mg | ORAL_TABLET | Freq: Every day | ORAL | Status: DC
  Administered 2024-03-21 – 2024-03-22 (×2): 5 mg via ORAL
  Filled 2024-03-21 (×3): qty 1

## 2024-03-21 MED ORDER — BUDESON-GLYCOPYRROL-FORMOTEROL 160-9-4.8 MCG/ACT IN AERO
2.0000 | INHALATION_SPRAY | Freq: Two times a day (BID) | RESPIRATORY_TRACT | Status: DC
  Administered 2024-03-21 – 2024-03-22 (×2): 2 via RESPIRATORY_TRACT
  Filled 2024-03-21: qty 5.9

## 2024-03-21 MED ORDER — ALBUTEROL SULFATE HFA 108 (90 BASE) MCG/ACT IN AERS
2.0000 | INHALATION_SPRAY | Freq: Once | RESPIRATORY_TRACT | Status: AC
  Administered 2024-03-21: 2 via RESPIRATORY_TRACT
  Filled 2024-03-21: qty 6.7

## 2024-03-21 MED ORDER — PREDNISONE 20 MG PO TABS
40.0000 mg | ORAL_TABLET | Freq: Every day | ORAL | Status: DC
  Administered 2024-03-22: 40 mg via ORAL
  Filled 2024-03-21: qty 2

## 2024-03-21 MED ORDER — ENOXAPARIN SODIUM 40 MG/0.4ML IJ SOSY
40.0000 mg | PREFILLED_SYRINGE | INTRAMUSCULAR | Status: DC
  Administered 2024-03-21 – 2024-03-22 (×2): 40 mg via SUBCUTANEOUS
  Filled 2024-03-21 (×2): qty 0.4

## 2024-03-21 MED ORDER — PREDNISONE 20 MG PO TABS
60.0000 mg | ORAL_TABLET | Freq: Once | ORAL | Status: AC
  Administered 2024-03-21: 60 mg via ORAL
  Filled 2024-03-21: qty 3

## 2024-03-21 MED ORDER — DOXYCYCLINE HYCLATE 100 MG PO CAPS
100.0000 mg | ORAL_CAPSULE | Freq: Two times a day (BID) | ORAL | 0 refills | Status: DC
  Filled 2024-03-21: qty 14, 7d supply, fill #0

## 2024-03-21 MED ORDER — TAMSULOSIN HCL 0.4 MG PO CAPS
0.4000 mg | ORAL_CAPSULE | Freq: Every day | ORAL | Status: DC
  Administered 2024-03-21 – 2024-03-22 (×2): 0.4 mg via ORAL
  Filled 2024-03-21 (×3): qty 1

## 2024-03-21 MED ORDER — CHLORHEXIDINE GLUCONATE CLOTH 2 % EX PADS
6.0000 | MEDICATED_PAD | Freq: Every day | CUTANEOUS | Status: DC
  Administered 2024-03-21: 6 via TOPICAL

## 2024-03-21 MED ORDER — MAGNESIUM SULFATE 2 GM/50ML IV SOLN
2.0000 g | Freq: Once | INTRAVENOUS | Status: AC
  Administered 2024-03-21: 2 g via INTRAVENOUS
  Filled 2024-03-21: qty 50

## 2024-03-21 MED ORDER — IPRATROPIUM-ALBUTEROL 0.5-2.5 (3) MG/3ML IN SOLN
3.0000 mL | Freq: Four times a day (QID) | RESPIRATORY_TRACT | Status: DC | PRN
  Administered 2024-03-21 – 2024-03-22 (×2): 3 mL via RESPIRATORY_TRACT
  Filled 2024-03-21 (×2): qty 3

## 2024-03-21 MED ORDER — PREDNISONE 20 MG PO TABS: ORAL_TABLET | ORAL | 0 refills | Status: DC

## 2024-03-21 MED ORDER — ATORVASTATIN CALCIUM 10 MG PO TABS
10.0000 mg | ORAL_TABLET | Freq: Every day | ORAL | Status: DC
  Administered 2024-03-21 – 2024-03-22 (×2): 10 mg via ORAL
  Filled 2024-03-21 (×3): qty 1

## 2024-03-21 MED ORDER — DOXYCYCLINE HYCLATE 100 MG PO TABS
100.0000 mg | ORAL_TABLET | Freq: Once | ORAL | Status: AC
  Administered 2024-03-21: 100 mg via ORAL
  Filled 2024-03-21: qty 1

## 2024-03-21 MED ORDER — FOLIC ACID 1 MG PO TABS
1.0000 mg | ORAL_TABLET | Freq: Every day | ORAL | Status: DC
  Administered 2024-03-21 – 2024-03-22 (×2): 1 mg via ORAL
  Filled 2024-03-21 (×3): qty 1

## 2024-03-21 MED ORDER — DOXYCYCLINE HYCLATE 100 MG PO CAPS: 100.0000 mg | ORAL_CAPSULE | Freq: Two times a day (BID) | ORAL | 0 refills | Status: DC

## 2024-03-21 MED ORDER — PREDNISONE 20 MG PO TABS
ORAL_TABLET | ORAL | 0 refills | Status: DC
  Filled 2024-03-21: qty 8, fill #0

## 2024-03-21 MED ORDER — IPRATROPIUM-ALBUTEROL 0.5-2.5 (3) MG/3ML IN SOLN
3.0000 mL | RESPIRATORY_TRACT | Status: DC
  Administered 2024-03-21 (×2): 3 mL via RESPIRATORY_TRACT
  Filled 2024-03-21: qty 3

## 2024-03-21 MED ORDER — AMOXICILLIN-POT CLAVULANATE 875-125 MG PO TABS
1.0000 | ORAL_TABLET | Freq: Two times a day (BID) | ORAL | Status: DC
  Administered 2024-03-21 – 2024-03-22 (×3): 1 via ORAL
  Filled 2024-03-21 (×4): qty 1

## 2024-03-21 MED ORDER — ALBUTEROL SULFATE (2.5 MG/3ML) 0.083% IN NEBU
10.0000 mg/h | INHALATION_SOLUTION | Freq: Once | RESPIRATORY_TRACT | Status: AC
  Administered 2024-03-21: 10 mg/h via RESPIRATORY_TRACT
  Filled 2024-03-21: qty 12

## 2024-03-21 NOTE — ED Notes (Addendum)
 Notified Ray MD of worsening dyspnea with exertion. Attempted to dc pt and pt ambulated to bathroom to empty leg bag. Pt will have to transport on public transportation. Pt states he does not use O2 at baseline, 2L [place] reapplied.

## 2024-03-21 NOTE — H&P (Signed)
 " History and Physical    Patient: Jason Wong FMW:[id number] DOB: 09-08-1955 DOA: 03/20/2024 DOS: the patient was seen and examined on 03/21/2024 PCP: Vicci Barnie NOVAK, MD  Patient coming from: Home  Chief Complaint:  Chief Complaint  Patient presents with   Shortness of Breath   HPI: Jason Wong is a 69 y.o. male with medical history significant of COPD (active smoker), HTN, HLD, hepatitis C and polysubstance abuse (crack cocaine), and last admitted to [facility] in 01/2024 for COPD exacerbation and b/l hydronephosis (Urology rec'd OP f/u and Flomax ) who p/w SOB/DOE c/w AECOPD.  The patient reported that the shortness of breath started suddenly yesterday evening after coming home around 4 PM. The patient attempted to alleviate symptoms using a home nebulizer treatment, but it was ineffective. The patient usually uses inhalers, but they were unavailable as he'd run out of them. The patient typically uses inhalers in the morning, as needed, and again at night before bed, which usually suffices. However, yesterday, the patient had to use the nebulizer more frequently than usual, prompting a visit to the ED. The patient has not been around anyone who is sick recently.  In the ED, pt hypoxic and required 2-8L [place] initially (now on RA). Labs unremarkable. CXR showed no acute findings. EDP requested admission for ongoing observation iso AECOPD.   Review of Systems: As mentioned in the history of present illness. All other systems reviewed and are negative. Past Medical History:  Diagnosis Date   Asthma    COPD (chronic obstructive pulmonary disease) (HCC)    Hepatitis C antibody positive in blood    HLD (hyperlipidemia)    HTN (hypertension)    On home oxygen  therapy    per pt 08-31-2021 uses 02 about every other day   Past Surgical History:  Procedure Laterality Date   INCISE AND DRAIN ABCESS     dog bite   Social History:  reports that he has been smoking cigarettes. He has never used  smokeless tobacco. He reports current alcohol use of about 2.0 standard drinks of alcohol per week. He reports that he does not currently use drugs after having used the following drugs: Crack cocaine.  Allergies[1]  Family History  Problem Relation Age of Onset   Hypertension Mother    Breast cancer Sister    Hypertension Sister    Colon cancer Brother    Colon polyps Neg Hx    Esophageal cancer Neg Hx    Rectal cancer Neg Hx    Stomach cancer Neg Hx     Prior to Admission medications  Medication Sig Start Date End Date Taking? Authorizing Provider  amLODipine  (NORVASC ) 5 MG tablet Take 1 tablet (5 mg total) by mouth daily. 02/16/24 05/16/24 Yes Perri DELENA Meliton Mickey., MD  atorvastatin  (LIPITOR) 10 MG tablet Take 1 tablet (10 mg total) by mouth daily. 02/16/24 05/16/24 Yes Perri DELENA Meliton Mickey., MD  folic acid  (FOLVITE ) 1 MG tablet Take 1 tablet (1 mg total) by mouth daily. 02/16/24 05/16/24 Yes Perri DELENA Meliton Mickey., MD  ipratropium-albuterol  (DUONEB) 0.5-2.5 (3) MG/3ML SOLN Take 3 mLs by nebulization every 6 (six) hours as needed (shortness of breath , wheezing). 02/16/24  Yes Perri DELENA Meliton Mickey., MD  budesonide -glycopyrrolate -formoterol  (BREZTRI  AEROSPHERE) 160-9-4.8 MCG/ACT AERO inhaler Inhale 2 puffs into the lungs 2 (two) times daily. Follow with your outpatient doctor for refills. Patient not taking: Reported on 03/21/2024 03/01/24   Vicci Barnie NOVAK, MD  cephALEXin  (KEFLEX ) 500 MG capsule Take 1 capsule (  500 mg total) by mouth 2 (two) times daily. Patient not taking: Reported on 03/21/2024 03/13/24   Doretha Folks, MD  doxycycline  (VIBRAMYCIN ) 100 MG capsule Take 1 capsule (100 mg total) by mouth 2 (two) times daily. One po bid x 7 days 03/21/24   Mesner, Selinda, MD  predniSONE  (DELTASONE ) 20 MG tablet 2 tabs po daily x 4 days 03/21/24   Lorette Selinda, MD    Physical Exam: Vitals:   03/20/24 2242 03/21/24 0209 03/21/24 0552 03/21/24 0933  BP: 109/80 (!) 155/107 104/75 (!)  113/96  Pulse: 77 77 65 85  Resp: 20 20 18 18   Temp: 98.3 F (36.8 C) (!) 97.5 F (36.4 C) 97.8 F (36.6 C) 98.2 F (36.8 C)  TempSrc:  Oral    SpO2: 95% 98% 100% 98%  Weight:      Height:       General: Alert, oriented x3, resting comfortably in no acute distress Respiratory: Lungs clear to auscultation bilaterally with normal respiratory effort; no w/r/r Cardiovascular: Regular rate and rhythm w/o m/r/g Abdomen: Soft, nontender, nondistended. Positive bowel sounds   Data Reviewed:  Lab Results  Component Value Date   WBC 6.5 03/20/2024   HGB 14.7 03/20/2024   HCT 44.5 03/20/2024   MCV 97.6 03/20/2024   PLT 393 03/20/2024   Lab Results  Component Value Date   GLUCOSE 92 03/20/2024   CALCIUM  9.8 03/20/2024   NA 140 03/20/2024   K 4.0 03/20/2024   CO2 24 03/20/2024   CL 103 03/20/2024   BUN 8 03/20/2024   CREATININE 0.87 03/20/2024   Lab Results  Component Value Date   ALT 9 03/13/2024   AST 29 03/13/2024   ALKPHOS 95 03/13/2024   BILITOT 1.0 03/13/2024   Lab Results  Component Value Date   INR 1.0 01/22/2024   INR 1.1 01/21/2024   INR 2.4 (H) 01/19/2024   Radiology: DG Chest 2 View Result Date: 03/20/2024 EXAM: 2 VIEW(S) XRAY OF THE CHEST 03/20/2024 06:48:00 PM COMPARISON: 02/13/2024 CLINICAL HISTORY: SHOB FINDINGS: LUNGS AND PLEURA: Hyperinflated lungs. No focal pulmonary opacity. No pleural effusion. No pneumothorax. HEART AND MEDIASTINUM: No acute abnormality of the cardiac and mediastinal silhouettes. BONES AND SOFT TISSUES: Increased AP diameter of chest. IMPRESSION: 1. No acute findings. Electronically signed by: Morgane Naveau MD 03/20/2024 07:44 PM EST RP Workstation: [id number]    Assessment and Plan: 42M h/o COPD (active smoker), HTN, HLD, hepatitis C and polysubstance abuse (crack cocaine), and last admitted to [facility] in 01/2024 for COPD exacerbation and b/l hydronephosis (Urology rec'd OP f/u and Flomax ) who p/w SOB/DOE c/w AECOPD.  AECOPD -PO  Augmentin  875mg  BID for 5 days iso AECOPD -PO prednisone  40mg  daily for 5 days iso AECOPD -Triple inhaler therapy (LAMA/LABA/ICS); consider Trelegy at discharge; consider IP Pulm consult to optimize OP meds given frequent admissions -Duonebs prn -Start montelukast  10mg  at bedtime -Incentive spirometry and flutter valve prn -Wean O2 as tolerated -Ambulatory pulse prior to d/c -OP Pulm f/u on d/c; consider Pulm rehab referral as well  Urinary retention B/l hydronephrosis Observed on recent admission -Continue pta Foley -Resume Flomax  0.4mg  daily (pt reportedly not taking this medication at home) -F/u renal US  -OP f/u w/ Urology as planned  HTN -PTA amlodipine   HLD -PTA atorvastatin    Advance Care Planning:   Code Status: Full Code   Consults: N/A  Family Communication: Nephew  Severity of Illness: The appropriate patient status for this patient is INPATIENT. Inpatient status is judged to be reasonable  and necessary in order to provide the required intensity of service to ensure the patient's safety. The patient's presenting symptoms, physical exam findings, and initial radiographic and laboratory data in the context of their chronic comorbidities is felt to place them at high risk for further clinical deterioration. Furthermore, it is not anticipated that the patient will be medically stable for discharge from the hospital within 2 midnights of admission.   * I certify that at the point of admission it is my clinical judgment that the patient will require inpatient hospital care spanning beyond 2 midnights from the point of admission due to high intensity of service, high risk for further deterioration and high frequency of surveillance required.*   ------- I spent 55 minutes reviewing previous notes, at the bedside counseling/discussing the treatment plan, and performing clinical documentation.  Author: Marsha Ada, MD 03/21/2024 11:42 AM  For on call review  [email or web address].      [1]  Allergies Allergen Reactions   Tobacco Shortness Of Breath and Other (See Comments)    CIGARETTE SMOKE TRIGGERS THE PATIENT'S RESPIRATORY ISSUES!!   "

## 2024-03-21 NOTE — ED Notes (Signed)
 Patient transported to Ultrasound

## 2024-03-21 NOTE — Progress Notes (Signed)
" ° °  Brief Progress Note   _____________________________________________________________________________________________________________  Patient Name: Jason Wong Patient DOB: Dec 12, 1955 Date: 03/21/2024      Data: Patient is a Med Surg level of care. Patient still has Continous Cardiac Monitoring order.    Action: Sent message to Dr Georgina to see if patient still needs Cardiac Monitoring order or if it can be Dc'd.    Response:  Dr Georgina discontinued Cardiac Monitoring order.   _____________________________________________________________________________________________________________  The [facility] RN [facility] [place] Please contact us  directly via secure chat (search for [facility]) or by calling us  at [phone] [facility]).  "

## 2024-03-21 NOTE — ED Provider Notes (Signed)
 "  EMERGENCY DEPARTMENT AT [facility] Provider Note   CSN: [id number] Arrival date & time: 03/20/24  1615     Patient presents with: Shortness of Breath   Jason Wong is a 69 y.o. male.   69 yo M here with nonproductive cough worsening since this AM. Afebrile. Worse with motion. Is out of his inhalers at home. Not on O2 at baseline. 'quit smoking this morning'. No edema.    Shortness of Breath      Prior to Admission medications  Medication Sig Start Date End Date Taking? Authorizing Provider  amLODipine  (NORVASC ) 5 MG tablet Take 1 tablet (5 mg total) by mouth daily. 02/16/24 05/16/24 Yes Perri DELENA Meliton Mickey., MD  atorvastatin  (LIPITOR) 10 MG tablet Take 1 tablet (10 mg total) by mouth daily. 02/16/24 05/16/24 Yes Perri DELENA Meliton Mickey., MD  folic acid  (FOLVITE ) 1 MG tablet Take 1 tablet (1 mg total) by mouth daily. 02/16/24 05/16/24 Yes Perri DELENA Meliton Mickey., MD  ipratropium-albuterol  (DUONEB) 0.5-2.5 (3) MG/3ML SOLN Take 3 mLs by nebulization every 6 (six) hours as needed (shortness of breath , wheezing). 02/16/24  Yes Perri DELENA Meliton Mickey., MD  budesonide -glycopyrrolate -formoterol  (BREZTRI  AEROSPHERE) 160-9-4.8 MCG/ACT AERO inhaler Inhale 2 puffs into the lungs 2 (two) times daily. Follow with your outpatient doctor for refills. Patient not taking: Reported on 03/21/2024 03/01/24   Vicci Barnie NOVAK, MD  cephALEXin  (KEFLEX ) 500 MG capsule Take 1 capsule (500 mg total) by mouth 2 (two) times daily. Patient not taking: Reported on 03/21/2024 03/13/24   Doretha Folks, MD  doxycycline  (VIBRAMYCIN ) 100 MG capsule Take 1 capsule (100 mg total) by mouth 2 (two) times daily. One po bid x 7 days 03/21/24   Kenaz Olafson, Selinda, MD  predniSONE  (DELTASONE ) 20 MG tablet 2 tabs po daily x 4 days 03/21/24   Julus Kelley, Selinda, MD    Allergies: Tobacco    Review of Systems  Respiratory:  Positive for shortness of breath.     Updated Vital Signs BP 104/75 (BP Location: Left Arm)    Pulse 65   Temp 97.8 F (36.6 C)   Resp 18   Ht 5' 7 (1.702 m)   Wt 53.5 kg   SpO2 100%   BMI 18.48 kg/m   Physical Exam Vitals and nursing note reviewed.  Constitutional:      Appearance: He is well-developed.  HENT:     Head: Normocephalic and atraumatic.  Cardiovascular:     Rate and Rhythm: Normal rate.  Pulmonary:     Effort: Pulmonary effort is normal. No respiratory distress.     Breath sounds: Decreased breath sounds and wheezing present.  Abdominal:     General: There is no distension.  Musculoskeletal:        General: Normal range of motion.     Cervical back: Normal range of motion.     Right lower leg: No edema.     Left lower leg: No edema.  Neurological:     Mental Status: He is alert.     (all labs ordered are listed, but only abnormal results are displayed) Labs Reviewed  CBC WITH DIFFERENTIAL/PLATELET - Abnormal; Notable for the following components:      Result Value   Eosinophils Absolute 0.6 (*)    All other components within normal limits  RESP PANEL BY RT-PCR (RSV, FLU A&B, COVID)  [id number]  BASIC METABOLIC PANEL WITH GFR    EKG: EKG Interpretation Date/Time:  Wednesday March 20 2024 16:46:43 EST  Ventricular Rate:  90 PR Interval:  140 QRS Duration:  82 QT Interval:  358 QTC Calculation: 437 R Axis:   84  Text Interpretation: Normal sinus rhythm Right atrial enlargement Nonspecific ST and T wave abnormality Abnormal ECG When compared with ECG of 13-Feb-2024 09:55, PREVIOUS ECG IS PRESENT Confirmed by Lorette Mayo [phone]) on 03/21/2024 2:00:42 AM  Radiology: DG Chest 2 View Result Date: 03/20/2024 EXAM: 2 VIEW(S) XRAY OF THE CHEST 03/20/2024 06:48:00 PM COMPARISON: 02/13/2024 CLINICAL HISTORY: SHOB FINDINGS: LUNGS AND PLEURA: Hyperinflated lungs. No focal pulmonary opacity. No pleural effusion. No pneumothorax. HEART AND MEDIASTINUM: No acute abnormality of the cardiac and mediastinal silhouettes. BONES AND SOFT TISSUES: Increased AP  diameter of chest. IMPRESSION: 1. No acute findings. Electronically signed by: Morgane Naveau MD 03/20/2024 07:44 PM EST RP Workstation: [id number]     Procedures   Medications Ordered in the ED  ipratropium-albuterol  (DUONEB) 0.5-2.5 (3) MG/3ML nebulizer solution 3 mL (3 mLs Nebulization Given 03/21/24 0253)  albuterol  (VENTOLIN  HFA) 108 (90 Base) MCG/ACT inhaler 2 puff (has no administration in time range)  acetaminophen  (TYLENOL ) tablet 1,000 mg (1,000 mg Oral Given 03/20/24 1813)  predniSONE  (DELTASONE ) tablet 60 mg (60 mg Oral Given 03/21/24 0247)  doxycycline  (VIBRA -TABS) tablet 100 mg (100 mg Oral Given 03/21/24 0247)                                    Medical Decision Making Amount and/or Complexity of Data Reviewed Labs: ordered. Radiology: ordered.  Risk Prescription drug management.  Likely copd exacerbation. Not hypoxia in triage vitals, but is on O2 at this point so will give some breathing treatments then eval to see if he really needs O2 or is even hypoxic.  After breathing treatments patient was much more mildly less tachypneic and was feeling a whole lot better.  Patient was able to ambulate around the department without significant hypoxia his lowest oxygen  was 93%.  No tachypnea.  Overall patient appears much better than arrival and is significantly improved.  Albuterol  inhaler provided.  Patient stable for discharge.  Final diagnoses:  COPD exacerbation [facility])    ED Discharge Orders          Ordered    doxycycline  (VIBRAMYCIN ) 100 MG capsule  2 times daily,   Status:  Discontinued        03/21/24 0543    predniSONE  (DELTASONE ) 20 MG tablet  Status:  Discontinued        03/21/24 0543    predniSONE  (DELTASONE ) 20 MG tablet        03/21/24 0543    doxycycline  (VIBRAMYCIN ) 100 MG capsule  2 times daily        03/21/24 0544               Torien Ramroop, Mayo, MD 03/21/24 9345  "

## 2024-03-21 NOTE — ED Notes (Signed)
 Ambulated pt without o2, pt o2 was sating around 97. Pt refused to walk a little more and was grunting and asked for breathing treatment. Pt states the oxygen  isn't doing anything and that our dinamap is wrong. Pt placed back on o2 at 3L.

## 2024-03-21 NOTE — ED Provider Notes (Signed)
 See previous provider's note for complete H&P.  This is a 69 year old male with significant history of COPD noncompliant with medication history of polysubstance use who initially presenting with complaints of shortness of breath and wheezing.  Symptoms ongoing for the past several days.  He states he ran out of his inhaler.  He was seen by the night team for his symptoms and receiving treatment with plan to subsequently discharged home with albuterol  inhaler, prednisone , and doxycycline .  However upon his standing and walking patient became increasingly shortness of breath.  On reassessment, diminished lung sounds and patient received additional breathing treatment including albuterol  continuous nebs as well as some magnesium .  On reassessment, lung sounds did improve, however felt patient is not stable enough to go home.  I appreciate consultation from [facility] hospitalist Dr. Georgina who agrees to admit patient for COPD exacerbation.  BP (!) 113/96 (BP Location: Left Arm)   Pulse 85   Temp 98.2 F (36.8 C)   Resp 18   Ht 5' 7 (1.702 m)   Wt 53.5 kg   SpO2 98%   BMI 18.48 kg/m   Results for orders placed or performed during the hospital encounter of 03/20/24  Resp panel by RT-PCR (RSV, Flu A&B, Covid) Anterior Nasal Swab   Collection Time: 03/20/24  4:59 PM   Specimen: Anterior Nasal Swab  Result Value Ref Range   SARS Coronavirus 2 by RT PCR NEGATIVE NEGATIVE   Influenza A by PCR NEGATIVE NEGATIVE   Influenza B by PCR NEGATIVE NEGATIVE   Resp Syncytial Virus by PCR NEGATIVE NEGATIVE  Basic metabolic panel   Collection Time: 03/20/24  5:04 PM  Result Value Ref Range   Sodium 140 135 - 145 mmol/L   Potassium 4.0 3.5 - 5.1 mmol/L   Chloride 103 98 - 111 mmol/L   CO2 24 22 - 32 mmol/L   Glucose, Bld 92 70 - 99 mg/dL   BUN 8 8 - 23 mg/dL   Creatinine, Ser 9.12 0.61 - 1.24 mg/dL   Calcium  9.8 8.9 - 10.3 mg/dL   GFR, Estimated >39 >39 mL/min   Anion gap 13 5 - 15  CBC with Differential    Collection Time: 03/20/24  5:04 PM  Result Value Ref Range   WBC 6.5 4.0 - 10.5 K/uL   RBC 4.56 4.22 - 5.81 MIL/uL   Hemoglobin 14.7 13.0 - 17.0 g/dL   HCT 55.4 60.9 - 47.9 %   MCV 97.6 80.0 - 100.0 fL   MCH 32.2 26.0 - 34.0 pg   MCHC 33.0 30.0 - 36.0 g/dL   RDW 86.5 88.4 - 84.4 %   Platelets 393 150 - 400 K/uL   nRBC 0.0 0.0 - 0.2 %   Neutrophils Relative % 36 %   Neutro Abs 2.4 1.7 - 7.7 K/uL   Lymphocytes Relative 43 %   Lymphs Abs 2.7 0.7 - 4.0 K/uL   Monocytes Relative 11 %   Monocytes Absolute 0.7 0.1 - 1.0 K/uL   Eosinophils Relative 9 %   Eosinophils Absolute 0.6 (H) 0.0 - 0.5 K/uL   Basophils Relative 1 %   Basophils Absolute 0.1 0.0 - 0.1 K/uL   Immature Granulocytes 0 %   Abs Immature Granulocytes 0.01 0.00 - 0.07 K/uL   DG Chest 2 View Result Date: 03/20/2024 EXAM: 2 VIEW(S) XRAY OF THE CHEST 03/20/2024 06:48:00 PM COMPARISON: 02/13/2024 CLINICAL HISTORY: SHOB FINDINGS: LUNGS AND PLEURA: Hyperinflated lungs. No focal pulmonary opacity. No pleural effusion. No pneumothorax. HEART  AND MEDIASTINUM: No acute abnormality of the cardiac and mediastinal silhouettes. BONES AND SOFT TISSUES: Increased AP diameter of chest. IMPRESSION: 1. No acute findings. Electronically signed by: Morgane Naveau MD 03/20/2024 07:44 PM EST RP Workstation: [id number]      Nivia Colon, PA-C 03/21/24 1143    Levander Houston, MD 04/04/24 1432

## 2024-03-22 ENCOUNTER — Other Ambulatory Visit (HOSPITAL_COMMUNITY): Payer: Self-pay

## 2024-03-22 ENCOUNTER — Other Ambulatory Visit: Payer: Self-pay

## 2024-03-22 DIAGNOSIS — J441 Chronic obstructive pulmonary disease with (acute) exacerbation: Principal | ICD-10-CM

## 2024-03-22 LAB — BASIC METABOLIC PANEL WITH GFR
Anion gap: 11 (ref 5–15)
BUN: 18 mg/dL (ref 8–23)
CO2: 22 mmol/L (ref 22–32)
Calcium: 8.8 mg/dL — ABNORMAL LOW (ref 8.9–10.3)
Chloride: 108 mmol/L (ref 98–111)
Creatinine, Ser: 0.82 mg/dL (ref 0.61–1.24)
GFR, Estimated: >60 mL/min
Glucose, Bld: 103 mg/dL — ABNORMAL HIGH (ref 70–99)
Potassium: 3.4 mmol/L — ABNORMAL LOW (ref 3.5–5.1)
Sodium: 141 mmol/L (ref 135–145)

## 2024-03-22 LAB — CBC
HCT: 38 % — ABNORMAL LOW (ref 39.0–52.0)
Hemoglobin: 12.6 g/dL — ABNORMAL LOW (ref 13.0–17.0)
MCH: 32.3 pg (ref 26.0–34.0)
MCHC: 33.2 g/dL (ref 30.0–36.0)
MCV: 97.4 fL (ref 80.0–100.0)
Platelets: 362 K/uL (ref 150–400)
RBC: 3.9 MIL/uL — ABNORMAL LOW (ref 4.22–5.81)
RDW: 13.5 % (ref 11.5–15.5)
WBC: 6.3 K/uL (ref 4.0–10.5)
nRBC: 0 % (ref 0.0–0.2)

## 2024-03-22 MED ORDER — DOXYCYCLINE HYCLATE 100 MG PO CAPS
100.0000 mg | ORAL_CAPSULE | Freq: Two times a day (BID) | ORAL | 0 refills | Status: AC
  Filled 2024-03-22: qty 14, 7d supply, fill #0

## 2024-03-22 MED ORDER — ALBUTEROL SULFATE HFA 108 (90 BASE) MCG/ACT IN AERS
2.0000 | INHALATION_SPRAY | Freq: Four times a day (QID) | RESPIRATORY_TRACT | 2 refills | Status: AC | PRN
  Filled 2024-03-22: qty 8, 30d supply, fill #0

## 2024-03-22 MED ORDER — TAMSULOSIN HCL 0.4 MG PO CAPS
0.4000 mg | ORAL_CAPSULE | Freq: Every day | ORAL | 0 refills | Status: AC
  Filled 2024-03-22: qty 30, 30d supply, fill #0

## 2024-03-22 MED ORDER — PREDNISONE 20 MG PO TABS
40.0000 mg | ORAL_TABLET | Freq: Every day | ORAL | 0 refills | Status: AC
  Filled 2024-03-22: qty 8, fill #0

## 2024-03-22 NOTE — TOC Transition Note (Signed)
 Transition of Care [facility]) - Discharge Note   Patient Details  Name: Jason Wong MRN: [id number] Date of Birth: 05-07-1955  Transition of Care [facility]) CM/SW Contact:  Andrez JULIANNA George, RN Phone Number: 03/22/2024, 8:52 AM   Clinical Narrative:    Pt is discharging home with self care. No needs per IP Care management.   Final next level of care: Home/Self Care Barriers to Discharge: No Barriers Identified   Patient Goals and CMS Choice            Discharge Placement                       Discharge Plan and Services Additional resources added to the After Visit Summary for                                       Social Drivers of Health (SDOH) Interventions SDOH Screenings   Food Insecurity: No Food Insecurity (03/21/2024)  Housing: Low Risk (03/21/2024)  Transportation Needs: No Transportation Needs (03/21/2024)  Utilities: Not At Risk (03/21/2024)  Alcohol Screen: Low Risk (12/01/2023)  Depression (PHQ2-9): Low Risk (12/01/2023)  Financial Resource Strain: Low Risk (12/01/2023)  Physical Activity: Inactive (12/01/2023)  Social Connections: Socially Isolated (03/21/2024)  Stress: No Stress Concern Present (12/01/2023)  Tobacco Use: High Risk (03/21/2024)  Health Literacy: Adequate Health Literacy (12/01/2023)     Readmission Risk Interventions    01/22/2024   10:31 AM 01/22/2024   10:30 AM 10/03/2023    3:32 PM  Readmission Risk Prevention Plan  Transportation Screening Complete Complete Complete  Medication Review (RN Care Manager) Complete  Complete  PCP or Specialist appointment within 3-5 days of discharge Complete Not Complete Complete  HRI or Home Care Consult Complete  Complete  SW Recovery Care/Counseling Consult Complete  Complete  Palliative Care Screening Not Applicable  Not Applicable  Skilled Nursing Facility Not Applicable  Not Applicable

## 2024-03-22 NOTE — Progress Notes (Signed)
 Patient ambulated length of the hall and back to his room, able to maintain 95% on room air.

## 2024-03-22 NOTE — Plan of Care (Signed)
  Problem: Education: Goal: Knowledge of General Education information will improve Description: Including pain rating scale, medication(s)/side effects and non-pharmacologic comfort measures Outcome: Progressing   Problem: Clinical Measurements: Goal: Diagnostic test results will improve Outcome: Progressing Goal: Respiratory complications will improve Outcome: Progressing   Problem: Activity: Goal: Risk for activity intolerance will decrease Outcome: Progressing   Problem: Nutrition: Goal: Adequate nutrition will be maintained Outcome: Progressing   Problem: Coping: Goal: Level of anxiety will decrease Outcome: Progressing   Problem: Pain Managment: Goal: General experience of comfort will improve and/or be controlled Outcome: Progressing

## 2024-03-22 NOTE — Discharge Summary (Signed)
 Physician Discharge Summary  Jason Wong FMW:[id number] DOB: 04-04-1955 DOA: 03/20/2024  PCP: Vicci Barnie NOVAK, MD  Admit date: 03/20/2024 Discharge date: 03/22/2024  Time spent: 42 minutes  Recommendations for Outpatient Follow-up:  Outpatient chest x-ray needed Get labs in the outpatient in 1 week  Discharge Diagnoses:  MAIN problem for hospitalization   COPD exacerbation  Please see below for itemized issues addressed in HOpsital- refer to other progress notes for clarity if needed  Discharge Condition: Improved  Diet recommendation: Heart healthy  Filed Weights   03/20/24 1643  Weight: 53.5 kg    History of present illness:  69 year old black male active smoker HTN HLD hep C Polysubstance abuse with crack-previous EtOH Macrocytosis without anemia Hyperlipidemia Known history of urinary retention and hydronephrosis   1/1 present from home out of inhalers to ED-still smoker-shortness of breath and wheeze - Was about to be discharged home with albuterol  inhaler prednisone  doxycycline  from ED became increasingly SOB--developed further hypoxia O2 requirements requiring 2 to 8 L and admitted for AECOPD WBC 6.5 hemoglobin 14 platelet 393--flu PCR RSV COVID-negative--US  renal stable right-sided mild hydronephrosis--CXR no acute finding  Patient rapidly resolved was on room air post discharge had a slight headache on day of discharge but otherwise was faring well was ambulatory according to nursing no fever chest pain no sputum I refilled his albuterol  and his Flomax  entered into the community [facility] pharmacy-he looks stable to leave the hospital but needs follow-up closely with his outpatient physicians Would recommend continued cessation of all substances and may be hep C titers etc. as an outpatient Stable for discharge maximally benefited Discharge Exam: Vitals:   03/21/24 2328 03/22/24 0432  BP: 126/83 117/78  Pulse: 81 75  Resp:    Temp: 98.7 F (37.1 C) 98.9 F  (37.2 C)  SpO2: 95% 100%    Subj on day of d/c   Awake coherent pleasant Chest clear no wheeze Abdomen soft no rebound ROM intact  Discharge Instructions   Discharge Instructions     Discharge instructions   Complete by: As directed    Make sure you take all your medications as indicated for your COPD-I would recommend that you take some time and walk around the hospital unit before leaving make sure that you can catch her breath-take doxycycline  and prednisone  as per the orders and pick them up from the community health pharmacy Use your inhalers as you have been at home make sure that you take your controller medication your Breztri  and also make sure that you have enough of your DuoNeb at home-I will refill your DuoNeb puffer and additionally I have refilled your Flomax  as you did not seem to be on it and you will need follow-up with your urologist who has been managing your catheter and nursing will let you change to a leg bag before sending you home Please follow-up with your primary physician as an outpatient   Increase activity slowly   Complete by: As directed       Allergies as of 03/22/2024       Reactions   Tobacco Shortness Of Breath, Other (See Comments)   CIGARETTE SMOKE TRIGGERS THE PATIENT'S RESPIRATORY ISSUES!!        Medication List     STOP taking these medications    acetaminophen  500 MG tablet Commonly known as: TYLENOL    cephALEXin  500 MG capsule Commonly known as: KEFLEX        TAKE these medications    albuterol  108 (90 Base) MCG/ACT  inhaler Commonly known as: VENTOLIN  HFA Inhale 2 puffs into the lungs every 6 (six) hours as needed for wheezing or shortness of breath. What changed:  when to take this additional instructions   amLODipine  5 MG tablet Commonly known as: NORVASC  Take 1 tablet (5 mg total) by mouth daily.   atorvastatin  10 MG tablet Commonly known as: LIPITOR Take 1 tablet (10 mg total) by mouth daily.   Breztri   Aerosphere 160-9-4.8 MCG/ACT Aero inhaler Generic drug: budesonide -glycopyrrolate -formoterol  Inhale 2 puffs into the lungs 2 (two) times daily. Follow with your outpatient doctor for refills.   doxycycline  100 MG capsule Commonly known as: VIBRAMYCIN  Take 1 capsule (100 mg total) by mouth 2 (two) times daily. One po bid x 7 days   folic acid  1 MG tablet Commonly known as: FOLVITE  Take 1 tablet (1 mg total) by mouth daily.   ipratropium-albuterol  0.5-2.5 (3) MG/3ML Soln Commonly known as: DUONEB Take 3 mLs by nebulization every 6 (six) hours as needed (shortness of breath , wheezing).   predniSONE  20 MG tablet Commonly known as: DELTASONE  2 tabs po daily x 4 days   tamsulosin  0.4 MG Caps capsule Commonly known as: FLOMAX  Take 1 capsule (0.4 mg total) by mouth daily.       Allergies[1]  Follow-up Information     Vicci Barnie NOVAK, MD. Schedule an appointment as soon as possible for a visit in 3 days.   Specialty: Internal Medicine Why: if not improving Contact information: [address] [place] [address] [phone]         [facility] Emergency Department at [facility].   Specialty: Emergency Medicine Why: If symptoms worsen Contact information: [address] [place]  [address] [phone]                 The results of significant diagnostics from this hospitalization (including imaging, microbiology, ancillary and laboratory) are listed below for reference.    Significant Diagnostic Studies: US  RENAL Result Date: 03/21/2024 EXAM: US  Retroperitoneum Complete, Renal. 03/21/2024 12:25:00 PM TECHNIQUE: Real-time ultrasonography of the retroperitoneum renal was performed. COMPARISON: US  Renal 02/14/2024. CLINICAL HISTORY: Hydronephrosis of right kidney, hydronephrosis of left kidney. FINDINGS: FINDINGS: RIGHT KIDNEY/URETER: Right kidney measures 9.9 x 4.9 x 5.1 cm. Normal cortical echogenicity. Mild  right-sided hydronephrosis is stable to slightly improved from the prior exam. No calculus. No mass. LEFT KIDNEY/URETER: Left kidney measures 8.7 x 5.1 x 3.7 cm. Normal cortical echogenicity. My shirt. Um, I am sorry the skin appears to be clear interesting so far. Um, yeah No significant left-sided hydronephrosis is present. No calculus. No mass. BLADDER: A foley catheter is in place. The urinary bladder is collapsed. IMPRESSION: 1. Stable to slightly improved mild right-sided hydronephrosis. 2. No significant left-sided hydronephrosis. 3. Foley catheter in situ Electronically signed by: Lonni Necessary MD 03/21/2024 01:14 PM EST RP Workstation: [id number]   DG Chest 2 View Result Date: 03/20/2024 EXAM: 2 VIEW(S) XRAY OF THE CHEST 03/20/2024 06:48:00 PM COMPARISON: 02/13/2024 CLINICAL HISTORY: SHOB FINDINGS: LUNGS AND PLEURA: Hyperinflated lungs. No focal pulmonary opacity. No pleural effusion. No pneumothorax. HEART AND MEDIASTINUM: No acute abnormality of the cardiac and mediastinal silhouettes. BONES AND SOFT TISSUES: Increased AP diameter of chest. IMPRESSION: 1. No acute findings. Electronically signed by: Morgane Naveau MD 03/20/2024 07:44 PM EST RP Workstation: [id number]    Microbiology: Recent Results (from the past 240 hours)  Resp panel by RT-PCR (RSV, Flu A&B, Covid) Anterior Nasal Swab  Status: None   Collection Time: 03/20/24  4:59 PM   Specimen: Anterior Nasal Swab  Result Value Ref Range Status   SARS Coronavirus 2 by RT PCR NEGATIVE NEGATIVE Final   Influenza A by PCR NEGATIVE NEGATIVE Final   Influenza B by PCR NEGATIVE NEGATIVE Final    Comment: (NOTE) The Xpert Xpress SARS-CoV-2/FLU/RSV plus assay is intended as an aid in the diagnosis of influenza from Nasopharyngeal swab specimens and should not be used as a sole basis for treatment. Nasal washings and aspirates are unacceptable for Xpert Xpress SARS-CoV-2/FLU/RSV testing.  Fact Sheet for  Patients: bloggercourse.com  Fact Sheet for Healthcare Providers: seriousbroker.it  This test is not yet approved or cleared by the [place]  FDA and has been authorized for detection and/or diagnosis of SARS-CoV-2 by FDA under an Emergency Use Authorization (EUA). This EUA will remain in effect (meaning this test can be used) for the duration of the COVID-19 declaration under Section 564(b)(1) of the Act, 21 U.S.C. section 360bbb-3(b)(1), unless the authorization is terminated or revoked.     Resp Syncytial Virus by PCR NEGATIVE NEGATIVE Final    Comment: (NOTE) Fact Sheet for Patients: bloggercourse.com  Fact Sheet for Healthcare Providers: seriousbroker.it  This test is not yet approved or cleared by the [place]  FDA and has been authorized for detection and/or diagnosis of SARS-CoV-2 by FDA under an Emergency Use Authorization (EUA). This EUA will remain in effect (meaning this test can be used) for the duration of the COVID-19 declaration under Section 564(b)(1) of the Act, 21 U.S.C. section 360bbb-3(b)(1), unless the authorization is terminated or revoked.  Performed at [facility], [address], [place], [place] [address]      Labs: Basic Metabolic Panel: Recent Labs  Lab 03/20/24 1704 03/22/24 0713  NA 140 141  K 4.0 3.4*  CL 103 108  CO2 24 22  GLUCOSE 92 103*  BUN 8 18  CREATININE 0.87 0.82  CALCIUM  9.8 8.8*   Liver Function Tests: No results for input(s): AST, ALT, ALKPHOS, BILITOT, PROT, ALBUMIN in the last 168 hours. No results for input(s): LIPASE, AMYLASE in the last 168 hours. No results for input(s): AMMONIA in the last 168 hours. CBC: Recent Labs  Lab 03/20/24 1704 03/22/24 0713  WBC 6.5 6.3  NEUTROABS 2.4  --   HGB 14.7 12.6*  HCT 44.5 38.0*  MCV 97.6 97.4  PLT 393 362   Cardiac Enzymes: No results  for input(s): CKTOTAL, CKMB, CKMBINDEX, TROPONINI in the last 168 hours. BNP: BNP (last 3 results) Recent Labs    12/12/23 2243 01/05/24 1841 02/13/24 1541  BNP 170.0* 60.4 46.4    ProBNP (last 3 results) No results for input(s): PROBNP in the last 8760 hours.  CBG: No results for input(s): GLUCAP in the last 168 hours.  Signed:  Jai-Gurmukh Juana Haralson MD   [facility] 03/22/2024, 8:24 AM      [1]  Allergies Allergen Reactions   Tobacco Shortness Of Breath and Other (See Comments)    CIGARETTE SMOKE TRIGGERS THE PATIENT'S RESPIRATORY ISSUES!!

## 2024-03-22 NOTE — Progress Notes (Signed)
 Patient discharge to home. AVS reviewed with the patient and he was taken to the discharge lounge

## 2024-03-22 NOTE — Plan of Care (Signed)

## 2024-03-25 ENCOUNTER — Telehealth: Payer: Self-pay | Admitting: *Deleted

## 2024-03-25 NOTE — Transitions of Care (Post Inpatient/ED Visit) (Signed)
" ° °  03/25/2024  Name: Jason Wong MRN: [id number] DOB: 06/06/1955  Today's TOC FU Call Status: Today's TOC FU Call Status:: Unsuccessful Call (1st Attempt) Unsuccessful Call (1st Attempt) Date: 03/25/24  Attempted to reach the patient regarding the most recent Inpatient/ED visit.  Follow Up Plan: Additional outreach attempts will be made to reach the patient to complete the Transitions of Care (Post Inpatient/ED visit) call.   Andrea Dimes RN, BSN [place]  [facility] RN Care Manager [phone]  "

## 2024-03-26 ENCOUNTER — Telehealth: Payer: Self-pay

## 2024-03-26 NOTE — Transitions of Care (Post Inpatient/ED Visit) (Signed)
" ° °  03/26/2024  Name: Jason Wong MRN: [id number] DOB: [birth year]  Today's TOC FU Call Status: Today's TOC FU Call Status:: Unsuccessful Call (2nd Attempt) Unsuccessful Call (2nd Attempt) Date: 03/26/24  Attempted to reach the patient regarding the most recent Inpatient/ED visit.  Follow Up Plan: Additional outreach attempts will be made to reach the patient to complete the Transitions of Care (Post Inpatient/ED visit) call.    Bing Edison MSN, RN RN [facility]  [facility] Office Hours M-F [phone] Direct Dial: [phone] Main Phone [phone]  Fax: [phone] [email or web address]    "

## 2024-03-27 ENCOUNTER — Telehealth: Payer: Self-pay

## 2024-03-27 NOTE — Transitions of Care (Post Inpatient/ED Visit) (Signed)
" ° °  03/27/2024  Name: Jason Wong MRN: [id number] DOB: 18-Apr-1955  Today's TOC FU Call Status: Today's TOC FU Call Status:: Unsuccessful Call (3rd Attempt) Unsuccessful Call (3rd Attempt) Date: 03/27/24  Attempted to reach the patient regarding the most recent Inpatient/ED visit.  Follow Up Plan: No further outreach attempts will be made at this time. We have been unable to contact the patient.  Alan Ee, RN, BSN, CEN Population Health- Transition of Care Team.  [facility] [phone]  "

## 2024-04-01 ENCOUNTER — Other Ambulatory Visit: Payer: Self-pay

## 2024-05-30 ENCOUNTER — Ambulatory Visit: Payer: Self-pay | Admitting: Internal Medicine
